# Patient Record
Sex: Female | Born: 1945 | Race: White | Hispanic: No | Smoking: Former smoker
Health system: Southern US, Community
[De-identification: ages and names within clinical notes are randomized; demographics above are authoritative.]

## PROBLEM LIST (undated history)

## (undated) DIAGNOSIS — Z87442 Personal history of urinary calculi: Secondary | ICD-10-CM

## (undated) DIAGNOSIS — M1990 Unspecified osteoarthritis, unspecified site: Secondary | ICD-10-CM

## (undated) DIAGNOSIS — R011 Cardiac murmur, unspecified: Secondary | ICD-10-CM

## (undated) DIAGNOSIS — I350 Nonrheumatic aortic (valve) stenosis: Secondary | ICD-10-CM

## (undated) DIAGNOSIS — I509 Heart failure, unspecified: Secondary | ICD-10-CM

## (undated) DIAGNOSIS — J189 Pneumonia, unspecified organism: Secondary | ICD-10-CM

## (undated) DIAGNOSIS — F329 Major depressive disorder, single episode, unspecified: Secondary | ICD-10-CM

## (undated) DIAGNOSIS — F32A Depression, unspecified: Secondary | ICD-10-CM

## (undated) DIAGNOSIS — Z950 Presence of cardiac pacemaker: Secondary | ICD-10-CM

## (undated) DIAGNOSIS — I10 Essential (primary) hypertension: Secondary | ICD-10-CM

## (undated) DIAGNOSIS — I9589 Other hypotension: Secondary | ICD-10-CM

## (undated) DIAGNOSIS — E119 Type 2 diabetes mellitus without complications: Secondary | ICD-10-CM

## (undated) DIAGNOSIS — J449 Chronic obstructive pulmonary disease, unspecified: Secondary | ICD-10-CM

## (undated) DIAGNOSIS — I639 Cerebral infarction, unspecified: Secondary | ICD-10-CM

## (undated) DIAGNOSIS — R55 Syncope and collapse: Secondary | ICD-10-CM

## (undated) DIAGNOSIS — R0601 Orthopnea: Secondary | ICD-10-CM

## (undated) DIAGNOSIS — G43909 Migraine, unspecified, not intractable, without status migrainosus: Secondary | ICD-10-CM

## (undated) DIAGNOSIS — K8020 Calculus of gallbladder without cholecystitis without obstruction: Secondary | ICD-10-CM

## (undated) DIAGNOSIS — E7800 Pure hypercholesterolemia, unspecified: Secondary | ICD-10-CM

## (undated) DIAGNOSIS — E039 Hypothyroidism, unspecified: Secondary | ICD-10-CM

## (undated) DIAGNOSIS — I5022 Chronic systolic (congestive) heart failure: Secondary | ICD-10-CM

## (undated) DIAGNOSIS — F419 Anxiety disorder, unspecified: Secondary | ICD-10-CM

## (undated) DIAGNOSIS — D649 Anemia, unspecified: Secondary | ICD-10-CM

## (undated) DIAGNOSIS — N189 Chronic kidney disease, unspecified: Secondary | ICD-10-CM

## (undated) HISTORY — PX: CARDIAC CATHETERIZATION: SHX172

## (undated) HISTORY — PX: CYST EXCISION: SHX5701

## (undated) HISTORY — DX: Calculus of gallbladder without cholecystitis without obstruction: K80.20

## (undated) HISTORY — DX: Hypothyroidism, unspecified: E03.9

## (undated) HISTORY — PX: VEIN SURGERY: SHX48

## (undated) HISTORY — PX: BREAST SURGERY: SHX581

## (undated) SURGERY — ESOPHAGOGASTRODUODENOSCOPY (EGD) WITH PROPOFOL
Anesthesia: Monitor Anesthesia Care

---

## 1969-05-14 HISTORY — PX: CHOLECYSTECTOMY OPEN: SUR202

## 1999-05-15 DIAGNOSIS — I639 Cerebral infarction, unspecified: Secondary | ICD-10-CM

## 1999-05-15 HISTORY — DX: Cerebral infarction, unspecified: I63.9

## 2007-05-15 HISTORY — PX: LAPAROSCOPIC GASTRIC SLEEVE RESECTION: SHX5895

## 2013-01-19 HISTORY — PX: INSERT / REPLACE / REMOVE PACEMAKER: SUR710

## 2013-01-26 HISTORY — PX: CARDIAC VALVE REPLACEMENT: SHX585

## 2013-02-25 ENCOUNTER — Ambulatory Visit
Admission: RE | Admit: 2013-02-25 | Discharge: 2013-02-25 | Disposition: A | Discharge status: Home / Self Care | Payer: Medicare Other | Source: Ambulatory Visit | Attending: Cardiovascular Disease | Admitting: Cardiovascular Disease

## 2013-02-25 ENCOUNTER — Encounter (HOSPITAL_COMMUNITY): Payer: Self-pay | Admitting: General Practice

## 2013-02-25 ENCOUNTER — Inpatient Hospital Stay (HOSPITAL_COMMUNITY)
Admission: AD | Admit: 2013-02-25 | Discharge: 2013-02-27 | DRG: 194 | Disposition: A | Discharge status: Home / Self Care | Payer: Medicare (Managed Care) | Source: Ambulatory Visit | Attending: Cardiovascular Disease | Admitting: Cardiovascular Disease

## 2013-02-25 ENCOUNTER — Other Ambulatory Visit: Payer: Self-pay | Admitting: Cardiovascular Disease

## 2013-02-25 DIAGNOSIS — Z87891 Personal history of nicotine dependence: Secondary | ICD-10-CM

## 2013-02-25 DIAGNOSIS — Z8673 Personal history of transient ischemic attack (TIA), and cerebral infarction without residual deficits: Secondary | ICD-10-CM

## 2013-02-25 DIAGNOSIS — E662 Morbid (severe) obesity with alveolar hypoventilation: Secondary | ICD-10-CM | POA: Diagnosis present

## 2013-02-25 DIAGNOSIS — E7800 Pure hypercholesterolemia, unspecified: Secondary | ICD-10-CM | POA: Diagnosis present

## 2013-02-25 DIAGNOSIS — F411 Generalized anxiety disorder: Secondary | ICD-10-CM | POA: Diagnosis present

## 2013-02-25 DIAGNOSIS — Z79899 Other long term (current) drug therapy: Secondary | ICD-10-CM

## 2013-02-25 DIAGNOSIS — Z954 Presence of other heart-valve replacement: Secondary | ICD-10-CM

## 2013-02-25 DIAGNOSIS — J4489 Other specified chronic obstructive pulmonary disease: Secondary | ICD-10-CM | POA: Diagnosis present

## 2013-02-25 DIAGNOSIS — F3289 Other specified depressive episodes: Secondary | ICD-10-CM | POA: Diagnosis present

## 2013-02-25 DIAGNOSIS — I712 Thoracic aortic aneurysm, without rupture, unspecified: Secondary | ICD-10-CM | POA: Diagnosis present

## 2013-02-25 DIAGNOSIS — F329 Major depressive disorder, single episode, unspecified: Secondary | ICD-10-CM | POA: Diagnosis present

## 2013-02-25 DIAGNOSIS — R0602 Shortness of breath: Secondary | ICD-10-CM

## 2013-02-25 DIAGNOSIS — J189 Pneumonia, unspecified organism: Principal | ICD-10-CM | POA: Diagnosis present

## 2013-02-25 DIAGNOSIS — J449 Chronic obstructive pulmonary disease, unspecified: Secondary | ICD-10-CM | POA: Diagnosis present

## 2013-02-25 DIAGNOSIS — J90 Pleural effusion, not elsewhere classified: Secondary | ICD-10-CM | POA: Diagnosis present

## 2013-02-25 DIAGNOSIS — Z6829 Body mass index (BMI) 29.0-29.9, adult: Secondary | ICD-10-CM

## 2013-02-25 DIAGNOSIS — Z950 Presence of cardiac pacemaker: Secondary | ICD-10-CM

## 2013-02-25 DIAGNOSIS — J9819 Other pulmonary collapse: Secondary | ICD-10-CM | POA: Diagnosis present

## 2013-02-25 DIAGNOSIS — I10 Essential (primary) hypertension: Secondary | ICD-10-CM | POA: Diagnosis present

## 2013-02-25 DIAGNOSIS — M129 Arthropathy, unspecified: Secondary | ICD-10-CM | POA: Diagnosis present

## 2013-02-25 DIAGNOSIS — D500 Iron deficiency anemia secondary to blood loss (chronic): Secondary | ICD-10-CM | POA: Diagnosis present

## 2013-02-25 DIAGNOSIS — G43909 Migraine, unspecified, not intractable, without status migrainosus: Secondary | ICD-10-CM | POA: Diagnosis present

## 2013-02-25 DIAGNOSIS — E119 Type 2 diabetes mellitus without complications: Secondary | ICD-10-CM | POA: Diagnosis present

## 2013-02-25 HISTORY — DX: Migraine, unspecified, not intractable, without status migrainosus: G43.909

## 2013-02-25 HISTORY — DX: Presence of cardiac pacemaker: Z95.0

## 2013-02-25 HISTORY — DX: Unspecified osteoarthritis, unspecified site: M19.90

## 2013-02-25 HISTORY — DX: Depression, unspecified: F32.A

## 2013-02-25 HISTORY — DX: Pure hypercholesterolemia, unspecified: E78.00

## 2013-02-25 HISTORY — DX: Cardiac murmur, unspecified: R01.1

## 2013-02-25 HISTORY — DX: Type 2 diabetes mellitus without complications: E11.9

## 2013-02-25 HISTORY — DX: Major depressive disorder, single episode, unspecified: F32.9

## 2013-02-25 HISTORY — DX: Essential (primary) hypertension: I10

## 2013-02-25 HISTORY — DX: Cerebral infarction, unspecified: I63.9

## 2013-02-25 HISTORY — DX: Syncope and collapse: R55

## 2013-02-25 HISTORY — DX: Orthopnea: R06.01

## 2013-02-25 HISTORY — DX: Anxiety disorder, unspecified: F41.9

## 2013-02-25 LAB — CBC WITH DIFFERENTIAL/PLATELET
Basophils Absolute: 0 10*3/uL (ref 0.0–0.1)
Eosinophils Absolute: 0.1 10*3/uL (ref 0.0–0.7)
Eosinophils Relative: 1 % (ref 0–5)
HCT: 34.5 % — ABNORMAL LOW (ref 36.0–46.0)
Hemoglobin: 11.2 g/dL — ABNORMAL LOW (ref 12.0–15.0)
Lymphs Abs: 1.6 10*3/uL (ref 0.7–4.0)
MCH: 29 pg (ref 26.0–34.0)
MCV: 89.4 fL (ref 78.0–100.0)
Monocytes Absolute: 0.7 10*3/uL (ref 0.1–1.0)
Monocytes Relative: 8 % (ref 3–12)
Neutro Abs: 6.3 10*3/uL (ref 1.7–7.7)
Platelets: 267 10*3/uL (ref 150–400)
RBC: 3.86 MIL/uL — ABNORMAL LOW (ref 3.87–5.11)

## 2013-02-25 LAB — COMPREHENSIVE METABOLIC PANEL
ALT: 12 U/L (ref 0–35)
AST: 15 U/L (ref 0–37)
Albumin: 3.2 g/dL — ABNORMAL LOW (ref 3.5–5.2)
CO2: 24 mEq/L (ref 19–32)
Chloride: 107 mEq/L (ref 96–112)
Creatinine, Ser: 0.72 mg/dL (ref 0.50–1.10)
GFR calc non Af Amer: 87 mL/min — ABNORMAL LOW (ref >90)
Potassium: 4.5 mEq/L (ref 3.5–5.1)
Sodium: 143 mEq/L (ref 135–145)
Total Bilirubin: 0.5 mg/dL (ref 0.3–1.2)
Total Protein: 6.8 g/dL (ref 6.0–8.3)

## 2013-02-25 LAB — GLUCOSE, CAPILLARY: Glucose-Capillary: 110 mg/dL — ABNORMAL HIGH (ref 70–99)

## 2013-02-25 MED ORDER — ZOLPIDEM TARTRATE 5 MG PO TABS: 5.0000 mg | ORAL_TABLET | Freq: Every evening | ORAL | Status: DC | PRN

## 2013-02-25 MED ORDER — ACETAMINOPHEN 650 MG RE SUPP: 650.0000 mg | Freq: Four times a day (QID) | RECTAL | Status: DC | PRN

## 2013-02-25 MED ORDER — ACETAMINOPHEN 325 MG PO TABS
650.0000 mg | ORAL_TABLET | Freq: Four times a day (QID) | ORAL | Status: DC | PRN
  Administered 2013-02-26: 650 mg via ORAL
  Filled 2013-02-25: qty 2

## 2013-02-25 MED ORDER — ONDANSETRON HCL 4 MG PO TABS: 4.0000 mg | ORAL_TABLET | Freq: Four times a day (QID) | ORAL | Status: DC | PRN

## 2013-02-25 MED ORDER — DEXTROSE 5 % IV SOLN
1.0000 g | INTRAVENOUS | Status: DC
  Administered 2013-02-25 – 2013-02-26 (×2): 1 g via INTRAVENOUS
  Filled 2013-02-25 (×3): qty 10

## 2013-02-25 MED ORDER — ALBUTEROL SULFATE (5 MG/ML) 0.5% IN NEBU
2.5000 mg | INHALATION_SOLUTION | Freq: Four times a day (QID) | RESPIRATORY_TRACT | Status: DC
  Administered 2013-02-25 – 2013-02-27 (×4): 2.5 mg via RESPIRATORY_TRACT
  Filled 2013-02-25 (×7): qty 0.5

## 2013-02-25 MED ORDER — ALUM & MAG HYDROXIDE-SIMETH 200-200-20 MG/5ML PO SUSP: 30.0000 mL | Freq: Four times a day (QID) | ORAL | Status: DC | PRN

## 2013-02-25 MED ORDER — DOCUSATE SODIUM 100 MG PO CAPS
100.0000 mg | ORAL_CAPSULE | Freq: Two times a day (BID) | ORAL | Status: DC
  Administered 2013-02-26 (×2): 100 mg via ORAL
  Filled 2013-02-25 (×5): qty 1

## 2013-02-25 MED ORDER — ASPIRIN EC 325 MG PO TBEC
325.0000 mg | DELAYED_RELEASE_TABLET | Freq: Every day | ORAL | Status: DC
  Administered 2013-02-26 – 2013-02-27 (×2): 325 mg via ORAL
  Filled 2013-02-25 (×3): qty 1

## 2013-02-25 MED ORDER — ONDANSETRON HCL 4 MG/2ML IJ SOLN: 4.0000 mg | Freq: Four times a day (QID) | INTRAMUSCULAR | Status: DC | PRN

## 2013-02-25 MED ORDER — SODIUM CHLORIDE 0.9 % IJ SOLN
3.0000 mL | Freq: Two times a day (BID) | INTRAMUSCULAR | Status: DC
  Administered 2013-02-25 – 2013-02-27 (×4): 3 mL via INTRAVENOUS

## 2013-02-25 MED ORDER — ZOLPIDEM TARTRATE 5 MG PO TABS
10.0000 mg | ORAL_TABLET | Freq: Every evening | ORAL | Status: DC | PRN
  Administered 2013-02-25 – 2013-02-26 (×2): 10 mg via ORAL
  Filled 2013-02-25 (×2): qty 2

## 2013-02-25 MED ORDER — HEPARIN SODIUM (PORCINE) 5000 UNIT/ML IJ SOLN
5000.0000 [IU] | Freq: Three times a day (TID) | INTRAMUSCULAR | Status: DC
  Administered 2013-02-25 – 2013-02-27 (×5): 5000 [IU] via SUBCUTANEOUS
  Filled 2013-02-25 (×8): qty 1

## 2013-02-25 MED ORDER — IPRATROPIUM BROMIDE 0.02 % IN SOLN
0.5000 mg | Freq: Four times a day (QID) | RESPIRATORY_TRACT | Status: DC
  Administered 2013-02-25 – 2013-02-27 (×4): 0.5 mg via RESPIRATORY_TRACT
  Filled 2013-02-25 (×7): qty 2.5

## 2013-02-25 NOTE — Progress Notes (Signed)
Co-signed for LaTisha Teasley RN/BSN for assessments, IV assessments, medication administration, I's & O's, progress notes, care plans, and vital signs. Kristle Wesch M, RN//BSN 

## 2013-02-26 ENCOUNTER — Inpatient Hospital Stay (HOSPITAL_COMMUNITY): Payer: Medicare (Managed Care)

## 2013-02-26 LAB — GLUCOSE, CAPILLARY
Glucose-Capillary: 115 mg/dL — ABNORMAL HIGH (ref 70–99)
Glucose-Capillary: 99 mg/dL (ref 70–99)
Glucose-Capillary: 120 mg/dL — ABNORMAL HIGH (ref 70–99)

## 2013-02-26 LAB — BASIC METABOLIC PANEL
BUN: 24 mg/dL — ABNORMAL HIGH (ref 6–23)
CO2: 25 mEq/L (ref 19–32)
Calcium: 8.7 mg/dL (ref 8.4–10.5)
Creatinine, Ser: 0.74 mg/dL (ref 0.50–1.10)
Glucose, Bld: 112 mg/dL — ABNORMAL HIGH (ref 70–99)

## 2013-02-26 LAB — CBC
HCT: 30.9 % — ABNORMAL LOW (ref 36.0–46.0)
Hemoglobin: 10.1 g/dL — ABNORMAL LOW (ref 12.0–15.0)
MCH: 29.5 pg (ref 26.0–34.0)
MCHC: 32.7 g/dL (ref 30.0–36.0)
MCV: 90.4 fL (ref 78.0–100.0)
Platelets: 294 10*3/uL (ref 150–400)
RBC: 3.42 MIL/uL — ABNORMAL LOW (ref 3.87–5.11)
RDW: 14.3 % (ref 11.5–15.5)

## 2013-02-26 MED ORDER — LEVOTHYROXINE SODIUM 50 MCG PO TABS
50.0000 ug | ORAL_TABLET | Freq: Every day | ORAL | Status: DC
  Administered 2013-02-26 – 2013-02-27 (×2): 50 ug via ORAL
  Filled 2013-02-26 (×4): qty 1

## 2013-02-26 MED ORDER — FUROSEMIDE 10 MG/ML IJ SOLN
40.0000 mg | Freq: Once | INTRAMUSCULAR | Status: AC
  Administered 2013-02-26: 40 mg via INTRAVENOUS
  Filled 2013-02-26: qty 4

## 2013-02-26 MED ORDER — METOPROLOL TARTRATE 12.5 MG HALF TABLET
12.5000 mg | ORAL_TABLET | Freq: Two times a day (BID) | ORAL | Status: DC
  Administered 2013-02-26 – 2013-02-27 (×3): 12.5 mg via ORAL
  Filled 2013-02-26 (×4): qty 1

## 2013-02-26 MED ORDER — LOSARTAN POTASSIUM 50 MG PO TABS
50.0000 mg | ORAL_TABLET | Freq: Every day | ORAL | Status: DC
  Administered 2013-02-26 – 2013-02-27 (×2): 50 mg via ORAL
  Filled 2013-02-26 (×2): qty 1

## 2013-02-26 MED ORDER — METFORMIN HCL 850 MG PO TABS
425.0000 mg | ORAL_TABLET | Freq: Every day | ORAL | Status: DC
  Administered 2013-02-26: 425 mg via ORAL
  Filled 2013-02-26 (×3): qty 0.5

## 2013-02-26 MED ORDER — ALBUTEROL SULFATE (5 MG/ML) 0.5% IN NEBU
2.5000 mg | INHALATION_SOLUTION | Freq: Once | RESPIRATORY_TRACT | Status: AC
  Administered 2013-02-26: 2.5 mg via RESPIRATORY_TRACT
  Filled 2013-02-26: qty 0.5

## 2013-02-26 MED ORDER — FLUOXETINE HCL 20 MG PO CAPS
20.0000 mg | ORAL_CAPSULE | Freq: Every day | ORAL | Status: DC
  Administered 2013-02-26 – 2013-02-27 (×2): 20 mg via ORAL
  Filled 2013-02-26 (×2): qty 1

## 2013-02-26 MED ORDER — IOHEXOL 350 MG/ML SOLN
80.0000 mL | Freq: Once | INTRAVENOUS | Status: AC | PRN
  Administered 2013-02-26: 80 mL via INTRAVENOUS

## 2013-02-26 MED ORDER — POTASSIUM CHLORIDE CRYS ER 20 MEQ PO TBCR
20.0000 meq | EXTENDED_RELEASE_TABLET | Freq: Every day | ORAL | Status: DC
  Administered 2013-02-26 – 2013-02-27 (×2): 20 meq via ORAL
  Filled 2013-02-26 (×3): qty 1

## 2013-02-26 MED ORDER — FUROSEMIDE 20 MG PO TABS
20.0000 mg | ORAL_TABLET | Freq: Every day | ORAL | Status: DC
  Administered 2013-02-26 – 2013-02-27 (×2): 20 mg via ORAL
  Filled 2013-02-26 (×2): qty 1

## 2013-02-26 MED ORDER — ATORVASTATIN CALCIUM 40 MG PO TABS
40.0000 mg | ORAL_TABLET | Freq: Every day | ORAL | Status: DC
  Administered 2013-02-26 – 2013-02-27 (×2): 40 mg via ORAL
  Filled 2013-02-26 (×2): qty 1

## 2013-02-26 NOTE — Progress Notes (Signed)
Pt v paced. No c/o cp nor SOB.

## 2013-02-26 NOTE — Progress Notes (Signed)
Subjective:  No new complaints. No PE on CT chest but has moderate to large left pleural effusion post surgery 1 month ago. Mild transverse and descending aortic aneurysm measuring 3.7 and 3.8 cm in diameter respectively. Did not get breathing treatments today due to tests. Afebrile  Objective:  Vital Signs in the last 24 hours: Temp:  [98.1 F (36.7 C)-98.9 F (37.2 C)] 98.9 F (37.2 C) (10/16 1438) Pulse Rate:  [76-83] 76 (10/16 1500) Cardiac Rhythm:  [-] Ventricular paced (10/16 0727) Resp:  [17-20] 18 (10/16 1438) BP: (140-171)/(47-65) 171/55 mmHg (10/16 1500) SpO2:  [94 %-98 %] 98 % (10/16 1438) Weight:  [68.4 kg (150 lb 12.7 oz)-68.6 kg (151 lb 3.8 oz)] 68.6 kg (151 lb 3.8 oz) (10/16 1610)  Physical Exam: BP Readings from Last 1 Encounters:  02/26/13 171/55     Wt Readings from Last 1 Encounters:  02/26/13 68.6 kg (151 lb 3.8 oz)    Weight change:   HEENT: [place]/AT, Eyes- PERL, EOMI, Conjunctiva-Pale pink, Sclera-Non-icteric. Wears glasses and dentures. Neck: No JVD, No bruit, Trachea midline. Lungs:  Few crackles, Bilateral. Cardiac:  Regular rhythm, normal S1 and S2, no S3.  Abdomen:  Soft, non-tender. Extremities:  No edema present. No cyanosis. No clubbing. CNS: AxOx3, Cranial nerves grossly intact, moves all 4 extremities. Right handed. Skin: Warm and dry.   Intake/Output from previous day: 10/15 0701 - 10/16 0700 In: 410 [P.O.:360; IV Piggyback:50] Out: 300 [Urine:300]    Lab Results: BMET    Component Value Date/Time   NA 142 02/26/2013 0540   K 4.6 02/26/2013 0540   CL 105 02/26/2013 0540   CO2 25 02/26/2013 0540   GLUCOSE 112* 02/26/2013 0540   BUN 24* 02/26/2013 0540   CREATININE 0.74 02/26/2013 0540   CALCIUM 8.7 02/26/2013 0540   GFRNONAA 86* 02/26/2013 0540   GFRAA >90 02/26/2013 0540   CBC    Component Value Date/Time   WBC 6.9 02/26/2013 0540   RBC 3.42* 02/26/2013 0540   HGB 10.1* 02/26/2013 0540   HCT 30.9* 02/26/2013 0540   PLT 294  02/26/2013 0540   MCV 90.4 02/26/2013 0540   MCH 29.5 02/26/2013 0540   MCHC 32.7 02/26/2013 0540   RDW 14.3 02/26/2013 0540   LYMPHSABS 1.6 02/25/2013 2005   MONOABS 0.7 02/25/2013 2005   EOSABS 0.1 02/25/2013 2005   BASOSABS 0.0 02/25/2013 2005   CARDIAC ENZYMES No results found for this basename: CKTOTAL, CKMB, CKMBINDEX, TROPONINI    Scheduled Meds: . albuterol  2.5 mg Nebulization Q6H  . albuterol  2.5 mg Nebulization Once  . aspirin EC  325 mg Oral Daily  . atorvastatin  40 mg Oral Daily  . cefTRIAXone (ROCEPHIN)  IV  1 g Intravenous Q24H  . docusate sodium  100 mg Oral BID  . FLUoxetine  20 mg Oral Daily  . furosemide  40 mg Intravenous Once  . furosemide  20 mg Oral Daily  . heparin  5,000 Units Subcutaneous Q8H  . ipratropium  0.5 mg Nebulization Q6H  . levothyroxine  50 mcg Oral QAC breakfast  . losartan  50 mg Oral Daily  . metFORMIN  425 mg Oral Q breakfast  . metoprolol tartrate  12.5 mg Oral BID  . potassium chloride SA  20 mEq Oral Daily  . sodium chloride  3 mL Intravenous Q12H   Continuous Infusions:  PRN Meds:.acetaminophen, acetaminophen, alum & mag hydroxide-simeth, ondansetron (ZOFRAN) IV, ondansetron, zolpidem  Assessment/Plan: Shortness of breath, PE ruled out Possible  pnueumonia  Bilateral pleural effusion left greater than right.  Bilateral atelectasis of lower lungs  DM, II  Hypertension  Bioprosthetic AV surgery Anemia of blood loss Dual chamber pacemaker.  IV lasix x one. Resume breathing treatments. Reevaluate Effusion in 1 month.   LOS: 1 day    Orpah Cobb  MD  02/26/2013, 4:15 PM

## 2013-02-26 NOTE — Progress Notes (Signed)
Utilization Review Completed Deola Rewis J. Saadiya Wilfong, RN, BSN, NCM [phone]  

## 2013-02-26 NOTE — H&P (Signed)
Late Entry Tonya Glass is an 67 y.o. female.   Chief Complaint: Shortness of breath. HPI: 67 years old female with AV replacement and PV repair has shortness of breath with activity x 1 week. She has 80 pack year history of smoking. Her recent chest x-ray showed left pleural effusion and left basilar atelectasis or pneumonia, right basilar atelectasis and cardiomegaly with permanent pacemaker.  Past Medical History  Diagnosis Date  . Hypertension   . High cholesterol   . Heart murmur   . Walking pneumonia ~ 2012  . Orthopnoea     "just since [facility]" (02/25/2013)  . Type II diabetes mellitus   . Headache(784.0)   . Migraines     "none in years til 01/2013" (02/25/2013)  . Stroke 2001    denies residual on 02/25/2013  . Arthritis     "fingers" (02/25/2013)  . Depression   . Anxiety   . Syncope and collapse     "prior to [facility]" (02/25/2013)  . Pacemaker       Past Surgical History  Procedure Laterality Date  . Cholecystectomy  1970's  . Insert / replace / remove pacemaker  01/19/2013  . 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 Laparoscopic gastric sleeve resection  2009    History reviewed. No pertinent family history. Social History:  reports that she has quit smoking. Her smoking use included Cigarettes. She has a 80 pack-year smoking history. She has never used smokeless tobacco. She reports that she does not drink alcohol or use illicit drugs.  Allergies: No Known Allergies  Medications Prior to Admission  Medication Sig Dispense Refill  . aspirin 325 MG tablet Take 325 mg by mouth daily.      Marland Kitchen atorvastatin (LIPITOR) 40 MG tablet Take 40 mg by mouth daily.      Marland Kitchen FLUoxetine (PROZAC) 20 MG capsule Take 20 mg by mouth daily.      . furosemide (LASIX) 20 MG tablet Take 20 mg by mouth daily.      Marland Kitchen ibuprofen (ADVIL,MOTRIN) 200 MG tablet Take 400 mg by mouth every 6 (six) hours as needed for headache.      .  levothyroxine (SYNTHROID, LEVOTHROID) 50 MCG tablet Take 50 mcg by mouth daily before breakfast.      . losartan (COZAAR) 50 MG tablet Take 50 mg by mouth daily.      . metFORMIN (GLUCOPHAGE) 850 MG tablet Take 425 mg by mouth daily with breakfast.      . metoprolol tartrate (LOPRESSOR) 25 MG tablet Take 12.5 mg by mouth 2 (two) times daily.      . potassium chloride SA (K-DUR,KLOR-CON) 20 MEQ tablet Take 20 mEq by mouth daily.      Marland Kitchen zolpidem (AMBIEN) 10 MG tablet Take 10 mg by mouth at bedtime as needed for sleep.        Results for orders placed during the hospital encounter of 02/25/13 (from the past 48 hour(s))  COMPREHENSIVE METABOLIC PANEL     Status: Abnormal   Collection Time    02/25/13  8:05 PM      Result Value Range   Sodium 143  135 - 145 mEq/L   Potassium 4.5  3.5 - 5.1 mEq/L   Chloride 107  96 - 112 mEq/L   CO2 24  19 - 32 mEq/L   Glucose, Bld 118 (*) 70 - 99 mg/dL   BUN 22  6 -  23 mg/dL   Creatinine, Ser 2.13  0.50 - 1.10 mg/dL   Calcium 9.1  8.4 - 08.6 mg/dL   Total Protein 6.8  6.0 - 8.3 g/dL   Albumin 3.2 (*) 3.5 - 5.2 g/dL   AST 15  0 - 37 U/L   ALT 12  0 - 35 U/L   Alkaline Phosphatase 132 (*) 39 - 117 U/L   Total Bilirubin 0.5  0.3 - 1.2 mg/dL   GFR calc non Af Amer 87 (*) >90 mL/min   GFR calc Af Amer >90  >90 mL/min   Comment: (NOTE)     The eGFR has been calculated using the CKD EPI equation.     This calculation has not been validated in all clinical situations.     eGFR's persistently <90 mL/min signify possible Chronic Kidney     Disease.  CBC WITH DIFFERENTIAL     Status: Abnormal   Collection Time    02/25/13  8:05 PM      Result Value Range   WBC 8.7  4.0 - 10.5 K/uL   RBC 3.86 (*) 3.87 - 5.11 MIL/uL   Hemoglobin 11.2 (*) 12.0 - 15.0 g/dL   HCT 57.8 (*) 46.9 - 62.9 %   MCV 89.4  78.0 - 100.0 fL   MCH 29.0  26.0 - 34.0 pg   MCHC 32.5  30.0 - 36.0 g/dL   RDW 52.8  41.3 - 24.4 %   Platelets 267  150 - 400 K/uL   Neutrophils Relative % 72  43  - 77 %   Neutro Abs 6.3  1.7 - 7.7 K/uL   Lymphocytes Relative 18  12 - 46 %   Lymphs Abs 1.6  0.7 - 4.0 K/uL   Monocytes Relative 8  3 - 12 %   Monocytes Absolute 0.7  0.1 - 1.0 K/uL   Eosinophils Relative 1  0 - 5 %   Eosinophils Absolute 0.1  0.0 - 0.7 K/uL   Basophils Relative 0  0 - 1 %   Basophils Absolute 0.0  0.0 - 0.1 K/uL  TSH     Status: None   Collection Time    02/25/13  8:05 PM      Result Value Range   TSH 2.620  0.350 - 4.500 uIU/mL   Comment: Performed at [facility]  GLUCOSE, CAPILLARY     Status: Abnormal   Collection Time    02/25/13  9:36 PM      Result Value Range   Glucose-Capillary 110 (*) 70 - 99 mg/dL   Comment 1 Notify RN     Comment 2 Documented in Chart    BASIC METABOLIC PANEL     Status: Abnormal   Collection Time    02/26/13  5:40 AM      Result Value Range   Sodium 142  135 - 145 mEq/L   Potassium 4.6  3.5 - 5.1 mEq/L   Chloride 105  96 - 112 mEq/L   CO2 25  19 - 32 mEq/L   Glucose, Bld 112 (*) 70 - 99 mg/dL   BUN 24 (*) 6 - 23 mg/dL   Creatinine, Ser 0.10  0.50 - 1.10 mg/dL   Calcium 8.7  8.4 - 27.2 mg/dL   GFR calc non Af Amer 86 (*) >90 mL/min   GFR calc Af Amer >90  >90 mL/min   Comment: (NOTE)     The eGFR has been calculated using the CKD EPI equation.  This calculation has not been validated in all clinical situations.     eGFR's persistently <90 mL/min signify possible Chronic Kidney     Disease.  CBC     Status: Abnormal   Collection Time    02/26/13  5:40 AM      Result Value Range   WBC 6.9  4.0 - 10.5 K/uL   RBC 3.42 (*) 3.87 - 5.11 MIL/uL   Hemoglobin 10.1 (*) 12.0 - 15.0 g/dL   HCT 16.1 (*) 09.6 - 04.5 %   MCV 90.4  78.0 - 100.0 fL   MCH 29.5  26.0 - 34.0 pg   MCHC 32.7  30.0 - 36.0 g/dL   RDW 40.9  81.1 - 91.4 %   Platelets 294  150 - 400 K/uL   Dg Chest 2 View  02/25/2013   CLINICAL DATA:  Cough, shortness of breath  EXAM: CHEST  2 VIEW  COMPARISON:  None.  FINDINGS: There is a small left pleural  effusion with left basilar atelectasis. Pneumonia at the left lung base cannot be excluded. Minimal opacity at the right lung base may represent atelectasis and tiny effusion, but followup is recommended to assess stability. Cardiomegaly is noted. A permanent pacemaker is present. The bones are osteopenic and there are diffuse degenerative changes present.  IMPRESSION: 1. Left pleural effusion and left basilar atelectasis or pneumonia. Recommend followup. 2. Right basilar atelectasis. 3. Cardiomegaly with permanent pacemaker.   Electronically Signed   By: Dwyane Dee M.D.   On: 02/25/2013 17:01    ROS Significant weight loss of 178 pounds in 5 years mostly in first 3 years. Wears reading and driving glasses, No cataract surgery, no hearing loss, +ve rhinorrhea, wears full upper and lower dentures, No cough or hemoptysis, + COPD, +  Pneumonia, + dizziness, No chest pain, No leg edema, No nausea, vomiting or diarrhea. +ve constipation.No hepatitis, No blood transfusion, +ve kidney stone, +ve stroke-2001, No seizures, No psych admission +ve joint pains.  Blood pressure 140/55, pulse 77, temperature 98.7 F (37.1 C), temperature source Oral, resp. rate 17, height 5' (1.524 m), weight 68.6 kg (151 lb 3.8 oz), SpO2 94.00%. Physical Exam: HEENT: Normocephalic, atraumatic, wears glasses and full dentures. Conj-pale pink, Sclera-white. Neck-No JVD. Supple. Lungs-Few basilar crackles. Heart-S1, and S2 normal. II/VI systolic murmur. Abdomen: Soft, non-tender. Ext:-Trace edema, + clubbing. No cyanosis. Skin:- Warm and dry.  Assessment/Plan Shortness of breath Possible pnueumonia Bilateral pleural effusion left greater than right. Bilateral atelectasis of lower lungs DM, II Hypertension Possible AV surgery  Admit, antibiotic, breathing treatments Echocardiogram. Home medications. Possible CT angiogram of lungs.  Tonya Glass S 02/26/2013, 8:49 AM

## 2013-02-26 NOTE — Progress Notes (Signed)
Co-signed for LaTisha Teasley RN/BSN for assessments, IV assessments, medication administration, I's & O's, progress notes, care plans, and vital signs. Noreta Kue M, RN//BSN 

## 2013-02-26 NOTE — Progress Notes (Signed)
*  PRELIMINARY RESULTS* Echocardiogram 2D Echocardiogram has been performed.  Tonya Glass 02/26/2013, 10:05 AM

## 2013-02-27 LAB — BASIC METABOLIC PANEL
CO2: 28 mEq/L (ref 19–32)
Calcium: 8.8 mg/dL (ref 8.4–10.5)
GFR calc Af Amer: 86 mL/min — ABNORMAL LOW (ref >90)
GFR calc non Af Amer: 75 mL/min — ABNORMAL LOW (ref >90)
Glucose, Bld: 118 mg/dL — ABNORMAL HIGH (ref 70–99)
Potassium: 3.9 mEq/L (ref 3.5–5.1)
Sodium: 144 mEq/L (ref 135–145)

## 2013-02-27 LAB — GLUCOSE, CAPILLARY: Glucose-Capillary: 121 mg/dL — ABNORMAL HIGH (ref 70–99)

## 2013-02-27 LAB — FERRITIN: Ferritin: 149 ng/mL (ref 10–291)

## 2013-02-27 LAB — IRON AND TIBC
Iron: 27 ug/dL — ABNORMAL LOW (ref 42–135)
TIBC: 240 ug/dL — ABNORMAL LOW (ref 250–470)

## 2013-02-27 MED ORDER — FUROSEMIDE 40 MG PO TABS: 40.0000 mg | ORAL_TABLET | Freq: Every day | ORAL | Status: DC

## 2013-02-27 MED ORDER — FERROUS SULFATE 325 (65 FE) MG PO TABS
325.0000 mg | ORAL_TABLET | Freq: Two times a day (BID) | ORAL | Status: DC
  Filled 2013-02-27 (×2): qty 1

## 2013-02-27 MED ORDER — FLUTICASONE PROPIONATE 50 MCG/ACT NA SUSP
1.0000 | Freq: Two times a day (BID) | NASAL | Status: DC
  Filled 2013-02-27: qty 16

## 2013-02-27 MED ORDER — AZITHROMYCIN 250 MG PO TABS: ORAL_TABLET | ORAL | Status: DC

## 2013-02-27 MED ORDER — POTASSIUM CHLORIDE CRYS ER 20 MEQ PO TBCR: 20.0000 meq | EXTENDED_RELEASE_TABLET | Freq: Two times a day (BID) | ORAL | Status: DC

## 2013-02-27 MED ORDER — ALBUTEROL SULFATE HFA 108 (90 BASE) MCG/ACT IN AERS: 2.0000 | INHALATION_SPRAY | Freq: Four times a day (QID) | RESPIRATORY_TRACT | Status: DC | PRN

## 2013-02-27 MED ORDER — ZOLPIDEM TARTRATE 5 MG PO TABS: 5.0000 mg | ORAL_TABLET | Freq: Every evening | ORAL | Status: DC | PRN

## 2013-02-27 MED ORDER — FERROUS SULFATE 325 (65 FE) MG PO TABS: 325.0000 mg | ORAL_TABLET | Freq: Two times a day (BID) | ORAL | Status: DC

## 2013-02-27 NOTE — Discharge Summary (Signed)
Physician Discharge Summary  Patient ID: Tonya Glass MRN: [id number] DOB/AGE: 15-Jul-1945 67 y.o.  Admit date: 02/25/2013 Discharge date: 02/27/2013  Admission Diagnoses: Shortness of breath, PE ruled out  Possible pnueumonia  Bilateral pleural effusion left greater than right.  Bilateral atelectasis of lower lungs  DM, II  Hypertension  Bioprosthetic AV surgery  Anemia of blood loss  Dual chamber pacemaker.  Discharge Diagnoses:  Principle Problem: * Possible pnueumonia * Bilateral pleural effusion left greater than right, post operatively.  Bilateral atelectasis of lower lungs  Chronic obstructive lung disease DM, II  Hypertension  Bioprosthetic AV surgery  Transverse and descending thoracic aortic aneurysm Anemia of blood loss and iron deficiency Dual chamber pacemaker. Anxiety  Discharged Condition: fair  Hospital Course: 67 years old female with AV replacement and ascending aorta repair had shortness of breath with activity x 1 week. She has 80 pack year history of smoking. Her recent chest x-ray showed left pleural effusion and left basilar atelectasis or pneumonia, right basilar atelectasis and cardiomegaly with permanent pacemaker. Her CT chest showed transverse and descending aortic aneurysm. She responded to antibiotic, albuterol treatments and IV lasix. She was discharged home in stable condition with follow up in 1 month. If her pleural effusion did not diecrease by next month she will be referred to pulmonary doctor. Iron supplement will be added for her iron deficiency anemia.  Consults: None  Significant Diagnostic Studies: labs: Near normal electrolytes and BUN/Cr. Mildly elevated blood sugar. Normal WBC and platelets count and low Hgb of 10-11 g/dl. Normal TSH. Low Iron level of 27 mcg/dl.  CT Angiography Chest: No pulmonary embolus detected.  Permanent pacer is in place. The right atrial lead impresses upon the anterior wall of the right atrium. The right  ventricular lead appears to extend into the anterior wall myocardium.  Cardiomegaly.  Post aortic valve replacement. Pericardial fluid. This does not appear hyperdense to suggest leak of contrast. This may represent simple postoperative resolving changes. Infection could not be excluded in proper clinical setting.  Calcified ectatic ascending thoracic aorta with maximal transverse dimension of 3.7 cm. Proximal descending thoracic aorta large aneurysm spans over 8.6 cm with maximal transverse dimension of 3.8 cm. The present exam was not tailored to evaluate the aorta our however, no obvious dissection is noted.  Moderate to large left-sided and small right-sided pleural effusion. Basilar consolidation greater on the left. This may represent atelectasis and/ or infiltrate. Recommend follow-up until clearance.   Treatments: cardiac meds: furosemide, Albuterol Tx and antibiotic  Discharge Exam: Blood pressure 132/71, pulse 88, temperature 98.4 F (36.9 C), temperature source Oral, resp. rate 18, height 5' (1.524 m), weight 67.903 kg (149 lb 11.2 oz), SpO2 97.00%. HEENT: [place]/AT, Eyes- PERL, EOMI, Conjunctiva-Pale pink, Sclera-Non-icteric. Wears glasses and dentures.  Neck: No JVD, No bruit, Trachea midline.  Lungs: Few crackles, Bilateral.  Cardiac: Regular rhythm, normal S1 and S2, no S3.  Abdomen: Soft, non-tender.  Extremities: No edema present. No cyanosis. No clubbing.  CNS: AxOx3, Cranial nerves grossly intact, moves all 4 extremities. Right handed.  Skin: Warm and dry.   Disposition: 01, Home, Self care     Medication List    STOP taking these medications       metFORMIN 850 MG tablet  Commonly known as:  GLUCOPHAGE      TAKE these medications       albuterol 108 (90 BASE) MCG/ACT inhaler  Commonly known as:  PROVENTIL HFA;VENTOLIN HFA  Inhale 2 puffs into the lungs  every 6 (six) hours as needed for wheezing.     aspirin 325 MG tablet  Take 325 mg by mouth daily.      atorvastatin 40 MG tablet  Commonly known as:  LIPITOR  Take 40 mg by mouth daily.     azithromycin 250 MG tablet  Commonly known as:  ZITHROMAX Z-PAK  Use as directed.     FLUoxetine 20 MG capsule  Commonly known as:  PROZAC  Take 20 mg by mouth daily.     furosemide 40 MG tablet  Commonly known as:  LASIX  Take 1 tablet (40 mg total) by mouth daily.     ibuprofen 200 MG tablet  Commonly known as:  ADVIL,MOTRIN  Take 400 mg by mouth every 6 (six) hours as needed for headache.     levothyroxine 50 MCG tablet  Commonly known as:  SYNTHROID, LEVOTHROID  Take 50 mcg by mouth daily before breakfast.     losartan 50 MG tablet  Commonly known as:  COZAAR  Take 50 mg by mouth daily.     metoprolol tartrate 25 MG tablet  Commonly known as:  LOPRESSOR  Take 12.5 mg by mouth 2 (two) times daily.     potassium chloride SA 20 MEQ tablet  Commonly known as:  K-DUR,KLOR-CON  Take 1 tablet (20 mEq total) by mouth 2 (two) times daily.     zolpidem 5 MG tablet  Commonly known as:  AMBIEN  Take 1 tablet (5 mg total) by mouth at bedtime as needed for sleep.           Follow-up Information   Follow up with [facility] S, MD. Schedule an appointment as soon as possible for a visit in 1 month.   Specialty:  Cardiology   Contact information:   [address] [place] [address] [phone]       Signed: Ricki Rodriguez 02/27/2013, 12:35 PM

## 2013-02-27 NOTE — Progress Notes (Signed)
The patient did not have any complaints overnight and did not have any acute changes  She states that she is feeling much better this morning.  She also states that she wants a prescription for the Ambien to take at home.

## 2013-03-04 LAB — CULTURE, BLOOD (ROUTINE X 2): Culture: NO GROWTH

## 2013-05-28 ENCOUNTER — Ambulatory Visit (INDEPENDENT_AMBULATORY_CARE_PROVIDER_SITE_OTHER): Payer: Medicare Other | Admitting: *Deleted

## 2013-05-28 ENCOUNTER — Ambulatory Visit (INDEPENDENT_AMBULATORY_CARE_PROVIDER_SITE_OTHER): Payer: Medicare Other | Admitting: Internal Medicine

## 2013-05-28 ENCOUNTER — Encounter: Payer: Self-pay | Admitting: Internal Medicine

## 2013-05-28 VITALS — BP 152/62 | HR 70 | Ht 60.0 in | Wt 142.9 lb

## 2013-05-28 DIAGNOSIS — I10 Essential (primary) hypertension: Secondary | ICD-10-CM | POA: Insufficient documentation

## 2013-05-28 DIAGNOSIS — E119 Type 2 diabetes mellitus without complications: Secondary | ICD-10-CM

## 2013-05-28 DIAGNOSIS — I5022 Chronic systolic (congestive) heart failure: Secondary | ICD-10-CM

## 2013-05-28 DIAGNOSIS — I509 Heart failure, unspecified: Secondary | ICD-10-CM | POA: Insufficient documentation

## 2013-05-28 DIAGNOSIS — Z9889 Other specified postprocedural states: Secondary | ICD-10-CM

## 2013-05-28 DIAGNOSIS — E785 Hyperlipidemia, unspecified: Secondary | ICD-10-CM

## 2013-05-28 DIAGNOSIS — I495 Sick sinus syndrome: Secondary | ICD-10-CM

## 2013-05-28 DIAGNOSIS — Z950 Presence of cardiac pacemaker: Secondary | ICD-10-CM

## 2013-05-28 DIAGNOSIS — Z953 Presence of xenogenic heart valve: Secondary | ICD-10-CM | POA: Insufficient documentation

## 2013-05-28 DIAGNOSIS — Z79899 Other long term (current) drug therapy: Secondary | ICD-10-CM

## 2013-05-28 LAB — PACEMAKER DEVICE OBSERVATION

## 2013-05-28 NOTE — Patient Instructions (Addendum)
Your physician recommends that you schedule a follow-up appointment in 3 months with Dr. Sallyanne Kuster (he manages pacemakers in our office)  Your physician wants you to follow-up in: 6 months with Dr. Debara Pickett. You will receive a reminder letter in the mail two months in advance. If you don't receive a letter, please call our office to schedule the follow-up appointment.   Please have fasting blood work at your earliest convenience. Dr. Debara Pickett wants to check your cholesterol. We will call you with the results.

## 2013-05-28 NOTE — Progress Notes (Addendum)
OFFICE NOTE  Chief Complaint:  Recent cold, no other complaints  Primary Care Physician: Thurman Coyer, MD  HPI:  Tonya Glass is a pleasant 68 year old female who recently relocated to [place]. She has been living for the past 26 years in [place], where she moved with her husband who is an Training and development officer. 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 For some reason she was being followed by a doctor in [place] who recommended referral to a cardiologist for episodes of dizziness that she was experiencing. Ultimately she saw doctors in [facility] and apparently underwent placement of a pacemaker and at some point had cardiac surgery. She initially reported that she had 2 valves replaced, however subsequently said that she was told that she had a blood vessel that was go "going to burst".  This sounds somewhat like an aneurysm and I wonder if she had a Bentall procedure.  We are still trying to obtain these records. She does have a history of gastric sleeve procedure in 2011 for which she lost weight coming from 330 pounds down to 142 pounds. She also made significant dietary changes. Postoperatively she was quite short of breath and required some recovery and therefore is in [place] living with her children.  PMHx:  Past Medical History  Diagnosis Date  . Hypertension   . High cholesterol   . Heart murmur   . Walking pneumonia ~ 2012  . Orthopnoea     "just since open heart surgery" (02/25/2013)  . Type II diabetes mellitus   . Headache(784.0)   . Migraines     "none in years til 01/2013" (02/25/2013)  . Stroke 2001    denies residual on 02/25/2013  . Arthritis     "fingers" (02/25/2013)  . Depression   . Anxiety   . Syncope and collapse     "prior to open heart surgery" (02/25/2013)  . Pacemaker     [employer]     Past Surgical History  Procedure Laterality Date  . Cholecystectomy  1971  . Insert / replace / remove pacemaker   01/19/2013    [employer] - dual chamber  . Cardiac valve replacement  01/26/2013    "cow valve put in; widened the second valve" (02/25/2013) - [place], [place]  . Laparoscopic gastric sleeve resection  2009    in [place]    FAMHx:  Family History  Problem Relation Age of Onset  . Lung cancer Mother   . Heart Problems Father     SOCHx:   reports that she quit smoking about 14 years ago. Her smoking use included Cigarettes. She has a 80 pack-year smoking history. She has never used smokeless tobacco. She reports that she does not drink alcohol or use illicit drugs.  ALLERGIES:  No Known Allergies  ROS: A comprehensive review of systems was negative except for: Respiratory: positive for dyspnea on exertion  HOME MEDS: Current Outpatient Prescriptions  Medication Sig Dispense Refill  . ALPRAZolam (XANAX) 0.25 MG tablet Take 0.25 mg by mouth 3 (three) times daily as needed.      Marland Kitchen amLODipine (NORVASC) 2.5 MG tablet Take 2.5 mg by mouth daily.      Marland Kitchen aspirin 325 MG tablet Take 325 mg by mouth daily.      Marland Kitchen atorvastatin (LIPITOR) 40 MG tablet Take 40 mg by mouth daily.      . carvedilol (COREG) 6.25 MG tablet Take 6.25 mg by mouth 2 (two) times  daily.      Marland Kitchen FLUoxetine (PROZAC) 20 MG capsule Take 20 mg by mouth daily.      . furosemide (LASIX) 40 MG tablet Take 20 mg by mouth daily.      Marland Kitchen levothyroxine (SYNTHROID, LEVOTHROID) 50 MCG tablet Take 50 mcg by mouth daily before breakfast.      . losartan (COZAAR) 50 MG tablet Take 50 mg by mouth daily.      . metFORMIN (GLUCOPHAGE) 850 MG tablet Take 425 mg by mouth daily with breakfast.      . potassium chloride SA (K-DUR,KLOR-CON) 20 MEQ tablet Take 1 tablet (20 mEq total) by mouth 2 (two) times daily.  60 tablet  3   No current facility-administered medications for this visit.    LABS/IMAGING: No results found for this or any previous visit (from the past 48 hour(s)). No results found.  VITALS: BP 152/62  Pulse 70  Ht 5'  (1.524 m)  Wt 142 lb 14.4 oz (64.819 kg)  BMI 27.91 kg/m2  EXAM: General appearance: alert and no distress Neck: no carotid bruit and no JVD Lungs: clear to auscultation bilaterally Heart: regular rate and rhythm, S1, S2 normal and sharp valve sound Abdomen: soft, non-tender; bowel sounds normal; no masses,  no organomegaly Extremities: extremities normal, atraumatic, no cyanosis or edema and venous stasis dermatitis noted Pulses: 2+ and symmetric Skin: Skin color, texture, turgor normal. No rashes or lesions or midline incision appears well-healed - pacemaker pocket noted, no evidence for hematoma or seroma Neurologic: Grossly normal Psych: Pleasant, normal mood, affect  EKG: AV paced rhythm at 70  Device Interrogation: Her [employer] dual-chamber pacemaker was interrogated today and demonstrated normal function with adequate battery life (7 years). She was ventricular paced at 96% of the time. There is a short episode of NSVT about 4 beats. There were also some atrial arrhythmias noted - most notably she appears to have had atrial flutter in October 2014.  This is paroxysmal.  ASSESSMENT: 1. 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 Paroxysmal atrial flutter 3. S/p cardiac surgery - possibly Bentall procedure (awaiting records) 4. ?CABG 5. HTN 6. Hyperlipidemia 7. DM2 8. 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   Tonya Glass appears to be doing fairly well after what sounds like extensive surgery and placement of a pacemaker at some point. It is still not clear what she had at surgery and she is not clear on that either. We do understand that her surgeon was Dr. Almond Lint at the [facility]. I'll go ahead and request records from their office regarding her most recent hospitalization there.  I would like to go ahead and check a lipid profile and CMP to evaluate her control of cholesterol and  atorvastatin. Finally, it is noted that she does have underlying atrial flutter and possibly atrial fibrillation which is paroxysmal. Her current medication list only aspirin 325 mg daily.  She should be on additional anticoagulation as her CHADS2VASC score is 4 - we will review records and try to obtain an accurate medication list from her pharmacy.  Plan to see her back in 6 months, or sooner as necessary. If she is not found to be on anticoagulation, I will start her on it.  >40 minutes spend with the patient, personally evaluating databases and previous studies, and requesting medical records.  Pixie Casino, MD, [facility] Attending Cardiologist [facility]  HILTY,Kenneth C 05/28/2013, 2:27 PM

## 2013-05-29 ENCOUNTER — Encounter: Payer: Self-pay | Admitting: Internal Medicine

## 2013-05-29 LAB — MDC_IDC_ENUM_SESS_TYPE_INCLINIC
Battery Remaining Longevity: 7
Implantable Pulse Generator Serial Number: 160873
Lead Channel Impedance Value: 816 Ohm
Lead Channel Pacing Threshold Amplitude: 0.7 V
Lead Channel Pacing Threshold Amplitude: 0.9 V
Lead Channel Sensing Intrinsic Amplitude: 1.9 mV
Lead Channel Sensing Intrinsic Amplitude: 25 mV
Lead Channel Setting Pacing Amplitude: 2.5 V
Lead Channel Setting Pacing Pulse Width: 0.4 ms
Lead Channel Setting Sensing Sensitivity: 2.5 mV
MDC IDC MSMT LEADCHNL RA IMPEDANCE VALUE: 595 Ohm
MDC IDC MSMT LEADCHNL RA PACING THRESHOLD PULSEWIDTH: 0.4 ms
MDC IDC MSMT LEADCHNL RV PACING THRESHOLD PULSEWIDTH: 0.4 ms
MDC IDC SET LEADCHNL RA PACING AMPLITUDE: 2 V
MDC IDC STAT BRADY RA PERCENT PACED: 70 %
MDC IDC STAT BRADY RV PERCENT PACED: 99 %

## 2013-05-29 NOTE — Progress Notes (Signed)
Pacemaker check in clinic. Normal device function. Thresholds, sensing, impedances consistent with previous measurements. Device programmed to maximize longevity. 12 ATR episodes (<1%)---max dur. 6 mins, Max A 248---AF/AFL + ASA alone ([place] aware). 5 high ventricular rates noted---4 from 9-15 during patient's cardiac valve replacement---the other episode came from November 2014 x 4 bts. RA output decreased from acute settings to 2.0V @ 0.25ms. Histogram distribution appropriate for patient activity level. Device programmed to optimize intrinsic conduction. Estimated longevity 7 years. Patient will follow up with [facility] in 3 months.

## 2013-08-12 ENCOUNTER — Encounter: Payer: Self-pay | Admitting: Cardiovascular Disease

## 2013-08-12 ENCOUNTER — Ambulatory Visit (INDEPENDENT_AMBULATORY_CARE_PROVIDER_SITE_OTHER): Payer: Medicare Other | Admitting: Cardiovascular Disease

## 2013-08-12 VITALS — BP 172/72 | HR 72 | Ht 60.0 in | Wt 148.8 lb

## 2013-08-12 DIAGNOSIS — I10 Essential (primary) hypertension: Secondary | ICD-10-CM

## 2013-08-12 DIAGNOSIS — Z950 Presence of cardiac pacemaker: Secondary | ICD-10-CM

## 2013-08-12 DIAGNOSIS — I495 Sick sinus syndrome: Secondary | ICD-10-CM

## 2013-08-12 LAB — MDC_IDC_ENUM_SESS_TYPE_INCLINIC
Brady Statistic RV Percent Paced: 100 %
Implantable Pulse Generator Serial Number: 160873
Lead Channel Impedance Value: 819 Ohm
Lead Channel Pacing Threshold Amplitude: 0.7 V
Lead Channel Pacing Threshold Amplitude: 1 V
Lead Channel Pacing Threshold Pulse Width: 0.4 ms
Lead Channel Pacing Threshold Pulse Width: 0.4 ms
Lead Channel Sensing Intrinsic Amplitude: 1.9 mV
Lead Channel Sensing Intrinsic Amplitude: 17.3 mV
Lead Channel Setting Pacing Pulse Width: 0.4 ms
Lead Channel Setting Sensing Sensitivity: 2.5 mV
MDC IDC MSMT LEADCHNL RA IMPEDANCE VALUE: 656 Ohm
MDC IDC SET LEADCHNL RA PACING AMPLITUDE: 2 V
MDC IDC SET LEADCHNL RV PACING AMPLITUDE: 2.5 V
MDC IDC STAT BRADY RA PERCENT PACED: 73 %

## 2013-08-12 NOTE — Progress Notes (Signed)
Patient ID: Tonya Glass, female   DOB: 05-08-1946, 68 y.o.   MRN: [id number]      Reason for office visit Establish pacemaker followup  This is the first time I have met Tonya Glass, who recently started seeing Dr. Debara Pickett for cardiology care. She underwent aortic valve surgery in September and apparently developed complete heart block and a pacemaker was implanted. She has subsequently regained AV conduction, albeit with a longer PR intervals/AV delay. She is asymptomatic today and is here for a pacemaker check. Her device is a [employer] dual chamber. She has a thoracic aortic aneurysm.  No Known Allergies  Current Outpatient Prescriptions  Medication Sig Dispense Refill  . ALPRAZolam (XANAX) 0.25 MG tablet Take 0.25 mg by mouth 3 (three) times daily as needed.      Marland Kitchen amLODipine (NORVASC) 2.5 MG tablet Take 2.5 mg by mouth daily.      Marland Kitchen aspirin 325 MG tablet Take 325 mg by mouth daily.      Marland Kitchen atorvastatin (LIPITOR) 40 MG tablet Take 40 mg by mouth daily.      . carvedilol (COREG) 6.25 MG tablet Take 3.125 mg by mouth 2 (two) times daily.       . clonazePAM (KLONOPIN) 2 MG tablet Take 2 mg by mouth at bedtime.      Marland Kitchen FLUoxetine (PROZAC) 20 MG capsule Take 20 mg by mouth daily.      . furosemide (LASIX) 40 MG tablet Take 20 mg by mouth daily.      Marland Kitchen levothyroxine (SYNTHROID, LEVOTHROID) 50 MCG tablet Take 50 mcg by mouth daily before breakfast.      . losartan (COZAAR) 50 MG tablet Take 50 mg by mouth daily.      . metFORMIN (GLUCOPHAGE) 850 MG tablet Take 425 mg by mouth daily with breakfast.      . traMADol (ULTRAM) 50 MG tablet Take 50 mg by mouth as needed.       No current facility-administered medications for this visit.    Past Medical History  Diagnosis Date  . Hypertension   . High cholesterol   . Heart murmur   . Walking pneumonia ~ 2012  . Orthopnoea     "just since open heart surgery" (02/25/2013)  . Type II diabetes mellitus   . Headache(784.0)     . Migraines     "none in years til 01/2013" (02/25/2013)  . Stroke 2001    denies residual on 02/25/2013  . Arthritis     "fingers" (02/25/2013)  . Depression   . Anxiety   . Syncope and collapse     "prior to open heart surgery" (02/25/2013)  . Pacemaker     [employer]     Past Surgical History  Procedure Laterality Date  . Cholecystectomy  1971  . Insert / replace / remove pacemaker  01/19/2013    [employer] - dual chamber  . Cardiac valve replacement  01/26/2013    "cow valve put in; widened the second valve" (02/25/2013) - [place], [place]  . Laparoscopic gastric sleeve resection  2009    in [place]    Family History  Problem Relation Age of Onset  . Lung cancer Mother   . Heart Problems Father     History   Social History  . Marital Status: Single    Spouse Name: N/A    Number of Children: 2  . Years of Education: dental sch   Occupational History  . Not on file.  Social History Main Topics  . Smoking status: Former Smoker -- 2.00 packs/day for 40 years    Types: Cigarettes    Quit date: 05/29/1999  . Smokeless tobacco: Never Used     Comment: 02/25/2013 "quit smoking in 2001"  . Alcohol Use: No  . Drug Use: No  . Sexual Activity: No   Other Topics Concern  . Not on file   Social History Narrative  . No narrative on file    Review of systems: The patient specifically denies any chest pain at rest or with exertion, dyspnea at rest or with exertion, orthopnea, paroxysmal nocturnal dyspnea, syncope, palpitations, focal neurological deficits, intermittent claudication, lower extremity edema, unexplained weight gain, cough, hemoptysis or wheezing.  The patient also denies abdominal pain, nausea, vomiting, dysphagia, diarrhea, constipation, polyuria, polydipsia, dysuria, hematuria, frequency, urgency, abnormal bleeding or bruising, fever, chills, unexpected weight changes, mood swings, change in skin or hair texture, change in voice quality,  auditory or visual problems, allergic reactions or rashes, new musculoskeletal complaints other than usual "aches and pains".  PHYSICAL EXAM BP 172/72  Pulse 72  Ht 5' (1.524 m)  Wt 67.495 kg (148 lb 12.8 oz)  BMI 29.06 kg/m2  General: Alert, oriented x3, no distress Head: no evidence of trauma, PERRL, EOMI, no exophtalmos or lid lag, no myxedema, no xanthelasma; normal ears, nose and oropharynx Neck: normal jugular venous pulsations and no hepatojugular reflux; brisk carotid pulses without delay and no carotid bruits Chest: clear to auscultation, no signs of consolidation by percussion or palpation, normal fremitus, symmetrical and full respiratory excursions;  Cardiovascular: normal position and quality of the apical impulse, regular rhythm, normal first and second heart sounds, no murmurs, rubs or gallops Abdomen: no tenderness or distention, no masses by palpation, no abnormal pulsatility or arterial bruits, normal bowel sounds, no hepatosplenomegaly Extremities: no clubbing, cyanosis or edema; 2+ radial, ulnar and brachial pulses bilaterally; 2+ right femoral, posterior tibial and dorsalis pedis pulses; 2+ left femoral, posterior tibial and dorsalis pedis pulses; no subclavian or femoral bruits Neurological: grossly nonfocal  BMET    Component Value Date/Time   NA 144 02/27/2013 0600   K 3.9 02/27/2013 0600   CL 107 02/27/2013 0600   CO2 28 02/27/2013 0600   GLUCOSE 118* 02/27/2013 0600   BUN 23 02/27/2013 0600   CREATININE 0.80 02/27/2013 0600   CALCIUM 8.8 02/27/2013 0600   GFRNONAA 75* 02/27/2013 0600   GFRAA 86* 02/27/2013 0600     ASSESSMENT AND PLAN Cardiac pacemaker in situ Both the atrial lead and ventricular lead parameters are excellent. The device was programmed with a fixed AV delay at 180 ms and she has been pacing the ventricle 100% at that setting. There is also fairly high rates of atrial pacing. During normal sinus rhythm her AV delay is relatively short and  around 240-250 ms, but during atrial pacing the AV delay is quite long at greater than 300 ms which explains the high frequency of pacing. Adaptive AV delay/AV search was turned on and hopefully we'll see a marked reduction in ventricular pacing. She is enrolled in [facility] follow-up, but we need to get her a cellular adapter    HTN (hypertension) Was quite high when she first checked in today but by the time I examined her it was down to 141/72 mm Hg. She is on a regimen of antihypertensives that appears to be a little complex with very low doses of amlodipine and carvedilol and losartan. I wonder whether this could  be simplified. The ARB is desirable since she has diabetes and the beta blocker is probably a good idea since she has a history of aortic aneurysm. On the other hand use of the beta blocker may increase the need for ventricular pacing. I did not make any changes to her medications today we'll discuss options with Dr. Debara Pickett.   No orders of the defined types were placed in this encounter.   Meds ordered this encounter  Medications  . clonazePAM (KLONOPIN) 2 MG tablet    Sig: Take 2 mg by mouth at bedtime.  . traMADol (ULTRAM) 50 MG tablet    Sig: Take 50 mg by mouth as needed.    Holli Humbles, MD, [place] [phone] office [phone] pager

## 2013-08-12 NOTE — Patient Instructions (Signed)
Remote monitoring is used to monitor your Pacemaker of ICD from home. This monitoring reduces the number of office visits required to check your device to one time per year. It allows [place] to keep an eye on the functioning of your device to ensure it is working properly. You are scheduled for a device check from home on November 13, 2013. You may send your transmission at any time that day. If you have a wireless device, the transmission will be sent automatically. After your physician reviews your transmission, you will receive a postcard with your next transmission date.  Your physician recommends that you schedule a follow-up appointment in:  [place].

## 2013-08-12 NOTE — Assessment & Plan Note (Signed)
Both the atrial lead and ventricular lead parameters are excellent. The device was programmed with a fixed AV delay at 180 ms and she has been pacing the ventricle 100% at that setting. There is also fairly high rates of atrial pacing. During normal sinus rhythm her AV delay is relatively short and around 240-250 ms, but during atrial pacing the AV delay is quite long at greater than 300 ms which explains the high frequency of pacing. Adaptive AV delay/AV search was turned on and hopefully we'll see a marked reduction in ventricular pacing. She is enrolled in [facility] follow-up, but we need to get her a cellular adapter

## 2013-08-12 NOTE — Assessment & Plan Note (Signed)
Was quite high when she first checked in today but by the time I examined her it was down to 141/72 mm Hg. She is on a regimen of antihypertensives that appears to be a little complex with very low doses of amlodipine and carvedilol and losartan. I wonder whether this could be simplified. The ARB is desirable since she has diabetes and the beta blocker is probably a good idea since she has a history of aortic aneurysm. On the other hand use of the beta blocker may increase the need for ventricular pacing. I did not make any changes to her medications today we'll discuss options with Dr. Debara Pickett.

## 2013-08-17 NOTE — Progress Notes (Signed)
Thanks .Marland Kitchen I'll review her anti-hypertensive regimen.  -[place]

## 2013-10-19 ENCOUNTER — Telehealth: Payer: Self-pay | Admitting: Cardiovascular Disease

## 2013-10-19 NOTE — Telephone Encounter (Signed)
Pt have a monitor,but she does not have a landline.She said somebody was suppose to be sending a device so she could hook ot up. That have been months and still does not have it.

## 2013-10-19 NOTE — Telephone Encounter (Signed)
Patient aware that adaptor will be sent.

## 2013-10-19 NOTE — Telephone Encounter (Signed)
Informed patient will notify [facility] ,WHO INTERROGATED HER DEVICE Defer to [place] Day

## 2013-10-21 NOTE — Telephone Encounter (Signed)
Corene Cornea w/[facility] aware.

## 2013-11-10 ENCOUNTER — Telehealth: Payer: Self-pay | Admitting: Internal Medicine

## 2013-11-10 NOTE — Telephone Encounter (Signed)
Cecilie Kicks NP spoke to Dr Thea Silversmith. Mickel Baas gave information to Dr Debara Pickett - per Mickel Baas .  Dr. Debara Pickett would like patient to have an office appointment with him.  Reason - blood pressure  issues

## 2013-11-11 ENCOUNTER — Telehealth: Payer: Self-pay | Admitting: Internal Medicine

## 2013-11-11 NOTE — Telephone Encounter (Signed)
Called and left message for patient to call and schedule appt with Dr. Debara Pickett for blood pressure issues.Tonya Glass

## 2013-11-16 ENCOUNTER — Encounter: Payer: Medicare Other | Admitting: *Deleted

## 2013-11-16 ENCOUNTER — Telehealth: Payer: Self-pay | Admitting: Cardiology

## 2013-11-16 NOTE — Telephone Encounter (Signed)
Called to confirm remote transmission appt with pt. Pt needs help hooking up monitor. I offered pt help over the phone. Pt denied help over the phone and insist that someone come out to her home to hook up monitor. She states that there is a lack of electrical space.

## 2013-11-16 NOTE — Telephone Encounter (Signed)
Instructed pt over phone how to set up and send home transmission.

## 2013-11-17 ENCOUNTER — Ambulatory Visit (INDEPENDENT_AMBULATORY_CARE_PROVIDER_SITE_OTHER): Payer: Medicare Other | Admitting: Internal Medicine

## 2013-11-17 ENCOUNTER — Encounter: Payer: Self-pay | Admitting: Internal Medicine

## 2013-11-17 ENCOUNTER — Encounter: Payer: Self-pay | Admitting: Cardiology

## 2013-11-17 VITALS — BP 179/73 | HR 70 | Ht 61.0 in | Wt 152.3 lb

## 2013-11-17 DIAGNOSIS — Z950 Presence of cardiac pacemaker: Secondary | ICD-10-CM

## 2013-11-17 DIAGNOSIS — E785 Hyperlipidemia, unspecified: Secondary | ICD-10-CM

## 2013-11-17 DIAGNOSIS — Z9889 Other specified postprocedural states: Secondary | ICD-10-CM

## 2013-11-17 DIAGNOSIS — I10 Essential (primary) hypertension: Secondary | ICD-10-CM

## 2013-11-17 DIAGNOSIS — I5022 Chronic systolic (congestive) heart failure: Secondary | ICD-10-CM

## 2013-11-17 DIAGNOSIS — E119 Type 2 diabetes mellitus without complications: Secondary | ICD-10-CM

## 2013-11-17 MED ORDER — OLMESARTAN MEDOXOMIL 20 MG PO TABS: 20.0000 mg | ORAL_TABLET | Freq: Every day | ORAL | Status: DC

## 2013-11-17 NOTE — Progress Notes (Signed)
OFFICE NOTE  Chief Complaint:  Labile hypertension  Primary Care Physician: Thurman Coyer, MD  HPI:  Tonya Glass is a pleasant 68 year old female who recently relocated to [place]. She has been living for the past 26 years in [place], where she moved with her husband who is an Training and development officer. 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 For some reason she was being followed by a doctor in [place] who recommended referral to a cardiologist for episodes of dizziness that she was experiencing. Ultimately she saw doctors in [facility] and apparently underwent placement of a pacemaker and at some point had cardiac surgery. She initially reported that she had 2 valves replaced, however subsequently said that she was told that she had a blood vessel that was go "going to burst".  This sounds somewhat like an aneurysm and I wonder if she had a Bentall procedure.  We are still trying to obtain these records. She does have a history of gastric sleeve procedure in 2011 for which she lost weight coming from 330 pounds down to 142 pounds. She also made significant dietary changes. Postoperatively she was quite short of breath and required some recovery and therefore is in [place] living with her children.  [name] returns today for followup. Since seeing her last, she has been seen by her primary care provider about 3 times for adjustment of her medications. She sees to be having problems with low blood pressure with diastolics in the 57Q and 46N. Systolic to been in the 629-528 range. He stopped to her blood pressure medications. Today in the office her blood pressure is elevated at 179/73. She is continuing to take carvedilol 6.25 mg twice daily.  PMHx:  Past Medical History  Diagnosis Date  . Hypertension   . High cholesterol   . Heart murmur   . Walking pneumonia ~ 2012  . Orthopnoea     "just since open heart surgery" (02/25/2013)  . Type II diabetes  mellitus   . Headache(784.0)   . Migraines     "none in years til 01/2013" (02/25/2013)  . Stroke 2001    denies residual on 02/25/2013  . Arthritis     "fingers" (02/25/2013)  . Depression   . Anxiety   . Syncope and collapse     "prior to open heart surgery" (02/25/2013)  . Pacemaker     [employer]     Past Surgical History  Procedure Laterality Date  . Cholecystectomy  1971  . Insert / replace / remove pacemaker  01/19/2013    [employer] - dual chamber  . Cardiac valve replacement  01/26/2013    "cow valve put in; widened the second valve" (02/25/2013) - [place], [place]  . Laparoscopic gastric sleeve resection  2009    in [place]    FAMHx:  Family History  Problem Relation Age of Onset  . Lung cancer Mother   . Heart Problems Father     SOCHx:   reports that she quit smoking about 14 years ago. Her smoking use included Cigarettes. She has a 80 pack-year smoking history. She has never used smokeless tobacco. She reports that she does not drink alcohol or use illicit drugs.  ALLERGIES:  No Known Allergies  ROS: A comprehensive review of systems was negative except for: Respiratory: positive for dyspnea on exertion  HOME MEDS: Current Outpatient Prescriptions  Medication Sig Dispense Refill  . ALPRAZolam (XANAX) 0.25 MG tablet Take 0.25 mg  by mouth 3 (three) times daily as needed.      Marland Kitchen aspirin 325 MG tablet Take 325 mg by mouth daily.      Marland Kitchen atorvastatin (LIPITOR) 40 MG tablet Take 40 mg by mouth daily.      . carvedilol (COREG) 6.25 MG tablet Take 6.25 mg by mouth 2 (two) times daily.       . clonazePAM (KLONOPIN) 2 MG tablet Take 2 mg by mouth at bedtime.      Marland Kitchen FLUoxetine (PROZAC) 20 MG capsule Take 20 mg by mouth daily.      Marland Kitchen levothyroxine (SYNTHROID, LEVOTHROID) 50 MCG tablet Take 50 mcg by mouth daily before breakfast.      . metFORMIN (GLUCOPHAGE) 850 MG tablet Take 425 mg by mouth daily with breakfast.      . traMADol (ULTRAM) 50 MG tablet Take  50 mg by mouth as needed.       No current facility-administered medications for this visit.    LABS/IMAGING: No results found for this or any previous visit (from the past 48 hour(s)). No results found.  VITALS: BP 179/73  Pulse 70  Ht 5\' 1"  (1.549 m)  Wt 152 lb 4.8 oz (69.083 kg)  BMI 28.79 kg/m2  EXAM: deferred  EKG: deferred  ASSESSMENT: 1. S/p [employer] pacemaker - complete heart block 2. ? Paroxysmal atrial flutter - brief post-op episode 3. 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 HTN - labile 5. Hyperlipidemia 6. DM2 7. 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 Labile hypertension 9. CAD - 40% mid-LAD stenosis  PLAN: 1.   [name] appears to be doing fairly well.  It turns out she apparently presented with complete heart block and underwent pacemaker placement. Subsequently she underwent cardiac catheterization which showed a 40% LAD lesion but no other significant stenoses. 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 She therefore underwent aortic valve replacement and descending aortic root replacement. She has done quite well from that. She is now struggling with some labile blood pressures. Recently she seen her primary care provider who has subsequently decreased her medication doses. Her blood pressure in the office today is elevated. I called Dr. Thea Silversmith and spoke with him to get a better sense of what is going on with her blood pressures. His feeling was that she continued to have diastolic hypotension with medications and therefore essentially stopped most of her medications. It is difficult to know what her true blood pressures are however this represented changes do to her blood pressure measurements at home. He reported she did bring  her cuff to validate with their cuff which was accurate. Ultimately it appears that she does need additional blood pressure medication. I would recommend restarting an ARB medication. Specifically Benicar 20 mg daily.  She should continue on her carvedilol. I would recommend she follow up with Cyril Mourning, our hypertension management pharmacist. She can work with her to adjust her medications. Finally, given the brief episode of atrial flutter, I do not see a clear indication for anticoagulation. We will continue to monitor her pacemaker to evaluate for further arrhythmias.  Pixie Casino, MD, [facility] Attending Cardiologist [facility]  HILTY,Kenneth C 11/17/2013, 5:10 PM

## 2013-11-17 NOTE — Patient Instructions (Signed)
Please schedule an appointment with Erasmo Downer (pharmacist) for BP check - 1-2 weeks ** please bring a list of your BP readings and your BP cuff  We will call you about information your BP and possible medications.

## 2013-12-08 ENCOUNTER — Encounter: Payer: Self-pay | Admitting: Cardiovascular Disease

## 2013-12-09 ENCOUNTER — Telehealth: Payer: Self-pay | Admitting: Pharmacist Clinician (PhC)/ Clinical Pharmacy Specialist

## 2013-12-15 ENCOUNTER — Ambulatory Visit: Payer: Medicare Other | Admitting: Pharmacist Clinician (PhC)/ Clinical Pharmacy Specialist

## 2013-12-15 NOTE — Telephone Encounter (Signed)
Closed encounter °

## 2013-12-16 ENCOUNTER — Encounter: Payer: Self-pay | Admitting: Pharmacist Clinician (PhC)/ Clinical Pharmacy Specialist

## 2013-12-16 ENCOUNTER — Ambulatory Visit (INDEPENDENT_AMBULATORY_CARE_PROVIDER_SITE_OTHER): Payer: Medicare Other | Admitting: Pharmacist Clinician (PhC)/ Clinical Pharmacy Specialist

## 2013-12-16 VITALS — BP 200/80 | HR 76 | Ht 61.0 in | Wt 153.1 lb

## 2013-12-16 DIAGNOSIS — I10 Essential (primary) hypertension: Secondary | ICD-10-CM

## 2013-12-16 NOTE — Progress Notes (Signed)
12/16/2013 Tonya Glass 12-20-1945 [id number]   HPI:  Tonya Glass is a 68 y.o. female patient of Dr. Debara Glass with a PMH below who presents today for a blood pressure check.  Her medical history is significant in that she had a gastric sleeve procedure in 2011 and lost 150 pounds.  She had been on several blood pressure medications, but they were all stopped at various times.  Most recently Dr. Debara Glass spoke with Dr. Thea Glass, her PCP, who was concerned about diastolic hypotension, and stopped all blood pressure meds, with exception of carvedilol 6.25mg  twice daily.  His office reports that her home cuff was validated in their office and found to be accurate.  When she saw Dr. Debara Glass last month her BP was 179/73.  She was started on Benicar 20mg  at that time and has had no problems with it.  She brings in home readings today that run 568-127N systolic with mostly 17G diastolic.  She reports no dizziness or lightheadedness.  She is not a smoker, having quit in 2001, drinks one glass of red wine each evening, and some caffeine during the day.  She follows a good diet and does not add salt to her foods.  She lives in a senior apartment complex and attends weekly 1 hour chair exercise classes, although doesn't do much exercise beyond that.   Her family history is significant in that her father died of a heart attack and her brother also has problems with hypertension.  Her mother had problems with obesity, as does her daughter, who recently underwent gastric bypass after failing lap band surgery several years ago.     Current Outpatient Prescriptions  Medication Sig Dispense Refill  . albuterol (PROVENTIL HFA;VENTOLIN HFA) 108 (90 BASE) MCG/ACT inhaler Inhale 2 puffs into the lungs. Use as directed -  With exercise      . ALPRAZolam (XANAX) 0.25 MG tablet Take 0.25 mg by mouth 3 (three) times daily as needed.      Marland Kitchen aspirin 325 MG tablet Take 325 mg by mouth daily.      Marland Kitchen atorvastatin (LIPITOR) 40 MG  tablet Take 40 mg by mouth daily.      . carvedilol (COREG) 6.25 MG tablet Take 6.25 mg by mouth 2 (two) times daily.       . clonazePAM (KLONOPIN) 2 MG tablet Take 2 mg by mouth at bedtime.      Marland Kitchen FLUoxetine (PROZAC) 20 MG capsule Take 20 mg by mouth daily.      Marland Kitchen levothyroxine (SYNTHROID, LEVOTHROID) 50 MCG tablet Take 50 mcg by mouth daily before breakfast.      . metFORMIN (GLUCOPHAGE) 850 MG tablet Take 425 mg by mouth daily with breakfast.      . olmesartan (BENICAR) 20 MG tablet Take 1 tablet (20 mg total) by mouth daily.  30 tablet  11  . traMADol (ULTRAM) 50 MG tablet Take 50 mg by mouth as needed.       No current facility-administered medications for this visit.    No Known Allergies  Past Medical History  Diagnosis Date  . Hypertension   . High cholesterol   . Heart murmur   . Walking pneumonia ~ 2012  . Orthopnoea     "just since open heart surgery" (02/25/2013)  . Type II diabetes mellitus   . Headache(784.0)   . Migraines     "none in years til 01/2013" (02/25/2013)  . Stroke 2001    denies residual on 02/25/2013  .  Arthritis     "fingers" (02/25/2013)  . Depression   . Anxiety   . Syncope and collapse     "prior to open heart surgery" (02/25/2013)  . Pacemaker     [employer]     Blood pressure 200/80, pulse 76, height 5\' 1"  (1.549 m), weight 153 lb 1.6 oz (69.446 kg). Standing 178/74  R arm 190/76   ASSESSMENT AND PLAN:    Tonya Glass PharmD CPP [facility]

## 2013-12-16 NOTE — Patient Instructions (Signed)
Return for a a follow up appointment in 2 months   Your blood pressure today is 200/80 by office meter, 161/77 by your meter  Check your blood pressure at home several times per week and keep record of the readings.  Take your BP meds as follows - continue with carvedilol twice daily and Benicar once daily  Bring all of your meds, your BP cuff and your record of home blood pressures to your next appointment.  Exercise as you're able, try to walk approximately 30 minutes per day.  Keep salt intake to a minimum, especially watch canned and prepared boxed foods.  Eat more fresh fruits and vegetables and fewer canned items.  Avoid eating in fast food restaurants.    HOW TO TAKE YOUR BLOOD PRESSURE:   Rest 5 minutes before taking your blood pressure.    Don't smoke or drink caffeinated beverages for at least 30 minutes before.   Take your blood pressure before (not after) you eat.   Sit comfortably with your back supported and both feet on the floor (don't cross your legs).   Elevate your arm to heart level on a table or a desk.   Use the proper sized cuff. It should fit smoothly and snugly around your bare upper arm. There should be enough room to slip a fingertip under the cuff. The bottom edge of the cuff should be 1 inch above the crease of the elbow.   Ideally, take 3 measurements at one sitting and record the average.

## 2013-12-17 NOTE — Assessment & Plan Note (Signed)
Today by office readings her BP is still elevated at 200/80.  A repeat reading after about 10 minutes brought it down to 184/76.  Her home BP cuff, but comparison read at 161/77.  She believes she has always had "white coat" hypertension.  Her home readings for the past month have been consistently 263-785 systolic and 88'F diastolic, with a few as low at 50.  I don't think her cuff is as accurate as she believes, but she states she is not in a financial position to buy a new one.  We talked at length about exercise, and increasing hers from just once weekly.  I suspect we will have to increase the Benicar, and perhaps add a second medication, but for now, she would like to increase her exercise.  I will see her again in 6-8 weeks.  I have asked that she continue with home BP checks, and to use the BP cuff at her local pharmacy whenever she is there, to see what readings she gets from another arm cuff.  I am also considering a 24 hr ambulatory cuff, to get a better picture of what her home readings truly are.

## 2014-02-24 ENCOUNTER — Encounter: Payer: Self-pay | Admitting: Cardiology

## 2014-07-27 ENCOUNTER — Encounter: Payer: Medicare Other | Admitting: Cardiovascular Disease

## 2014-07-30 ENCOUNTER — Telehealth: Payer: Self-pay | Admitting: Internal Medicine

## 2014-07-30 ENCOUNTER — Ambulatory Visit: Payer: Medicare Other | Admitting: Internal Medicine

## 2014-08-02 NOTE — Telephone Encounter (Signed)
Close encounter 

## 2014-09-06 ENCOUNTER — Encounter: Payer: Self-pay | Admitting: Internal Medicine

## 2014-09-06 ENCOUNTER — Ambulatory Visit (INDEPENDENT_AMBULATORY_CARE_PROVIDER_SITE_OTHER): Payer: Medicare Other | Admitting: Internal Medicine

## 2014-09-06 VITALS — BP 142/68 | HR 70 | Ht 61.0 in | Wt 175.9 lb

## 2014-09-06 DIAGNOSIS — Z950 Presence of cardiac pacemaker: Secondary | ICD-10-CM | POA: Diagnosis not present

## 2014-09-06 DIAGNOSIS — Z954 Presence of other heart-valve replacement: Secondary | ICD-10-CM | POA: Diagnosis not present

## 2014-09-06 DIAGNOSIS — I10 Essential (primary) hypertension: Secondary | ICD-10-CM | POA: Diagnosis not present

## 2014-09-06 DIAGNOSIS — I2583 Coronary atherosclerosis due to lipid rich plaque: Secondary | ICD-10-CM

## 2014-09-06 DIAGNOSIS — Z952 Presence of prosthetic heart valve: Secondary | ICD-10-CM

## 2014-09-06 DIAGNOSIS — Z9889 Other specified postprocedural states: Secondary | ICD-10-CM

## 2014-09-06 DIAGNOSIS — E785 Hyperlipidemia, unspecified: Secondary | ICD-10-CM | POA: Diagnosis not present

## 2014-09-06 DIAGNOSIS — I2510 Atherosclerotic heart disease of native coronary artery without angina pectoris: Secondary | ICD-10-CM | POA: Diagnosis not present

## 2014-09-06 MED ORDER — OLMESARTAN MEDOXOMIL 20 MG PO TABS: 30.0000 mg | ORAL_TABLET | Freq: Every day | ORAL | Status: DC

## 2014-09-06 NOTE — Patient Instructions (Signed)
Your physician has recommended you make the following change in your medication..  1. STOP losartan 2. INCREASE benicar to 30mg  (1.5 tablets) by mouth daily   Your physician has requested that you have an echocardiogram. Echocardiography is a painless test that uses sound waves to create images of your heart. It provides your doctor with information about the size and shape of your heart and how well your heart's chambers and valves are working. This procedure takes approximately one hour. There are no restrictions for this procedure. >> schedule this for April 2017  Your physician wants you to follow-up in: 1 year with Dr. Debara Pickett. You will receive a reminder letter in the mail two months in advance. If you don't receive a letter, please call our office to schedule the follow-up appointment.

## 2014-09-06 NOTE — Progress Notes (Signed)
OFFICE NOTE  Chief Complaint:  No complaints  Primary Care Physician: Darden Amber, PA  HPI:  Tonya Glass is a pleasant 69 year old female who recently relocated to [place]. She has been living for the past 26 years in [place], where she moved with her husband who is an Training and development officer. 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 For some reason she was being followed by a doctor in [place] who recommended referral to a cardiologist for episodes of dizziness that she was experiencing. Ultimately she saw doctors in [facility] and apparently underwent placement of a pacemaker and at some point had cardiac surgery. She initially reported that she had 2 valves replaced, however subsequently said that she was told that she had a blood vessel that was go "going to burst".  This sounds somewhat like an aneurysm and I wonder if she had a Bentall procedure.  We are still trying to obtain these records. She does have a history of gastric sleeve procedure in 2011 for which she lost weight coming from 330 pounds down to 142 pounds. She also made significant dietary changes. Postoperatively she was quite short of breath and required some recovery and therefore is in [place] living with her children.  Tonya Glass returns today for followup. Since seeing her last, she has been seen by her primary care provider about 3 times for adjustment of her medications. She sees to be having problems with low blood pressure with diastolics in the 40J and 81X. Systolic to been in the 914-782 range. He stopped to her blood pressure medications. Today in the office her blood pressure is elevated at 179/73. She is continuing to take carvedilol 6.25 mg twice daily.  I had the pleasure seeing Tonya Glass back in the office today. Overall she is doing well denies any chest pain or worsening shortness of breath. She's had no issues regarding her pacemaker which is remotely monitor. She has a  an appointment scheduled to see Dr. Loletha Grayer next month. She had repeat echocardiography 2 years ago to assess her valve which is a 23 mm MagnaEase aortic bioprosthetic. She also had concomitant aortic root replacement at the time. Blood pressure appears well controlled on current regimen.  PMHx:  Past Medical History  Diagnosis Date  . Hypertension   . High cholesterol   . Heart murmur   . Walking pneumonia ~ 2012  . Orthopnoea     "just since open heart surgery" (02/25/2013)  . Type II diabetes mellitus   . Headache(784.0)   . Migraines     "none in years til 01/2013" (02/25/2013)  . Stroke 2001    denies residual on 02/25/2013  . Arthritis     "fingers" (02/25/2013)  . Depression   . Anxiety   . Syncope and collapse     "prior to open heart surgery" (02/25/2013)  . Pacemaker     [employer]     Past Surgical History  Procedure Laterality Date  . Cholecystectomy  1971  . Insert / replace / remove pacemaker  01/19/2013    [employer] - dual chamber  . Cardiac valve replacement  01/26/2013    "cow valve put in; widened the second valve" (02/25/2013) - [place], [place]  . Laparoscopic gastric sleeve resection  2009    in [place]    FAMHx:  Family History  Problem Relation Age of Onset  . Lung cancer Mother   . Heart Problems Father   .  Hypertension Brother     SOCHx:   reports that she quit smoking about 15 years ago. Her smoking use included Cigarettes. She has a 80 pack-year smoking history. She has never used smokeless tobacco. She reports that she does not drink alcohol or use illicit drugs.  ALLERGIES:  No Known Allergies  ROS: A comprehensive review of systems was negative.  HOME MEDS: Current Outpatient Prescriptions  Medication Sig Dispense Refill  . albuterol (PROVENTIL HFA;VENTOLIN HFA) 108 (90 BASE) MCG/ACT inhaler Inhale 2 puffs into the lungs. Use as directed -  With exercise    . ALPRAZolam (XANAX) 0.25 MG tablet Take 0.25 mg by mouth 3 (three)  times daily as needed.    Marland Kitchen amLODipine (NORVASC) 5 MG tablet Take 1 tablet by mouth daily.    Marland Kitchen aspirin 325 MG tablet Take 325 mg by mouth daily.    Marland Kitchen atorvastatin (LIPITOR) 40 MG tablet Take 40 mg by mouth daily.    . Biotin 1 MG CAPS Take 1 capsule by mouth daily.    . carvedilol (COREG) 6.25 MG tablet Take 1 tablet by mouth 2 (two) times daily.    . clonazePAM (KLONOPIN) 2 MG tablet Take 2 mg by mouth at bedtime.    . cyanocobalamin 500 MCG tablet Take 500 mcg by mouth.    Marland Kitchen FLUoxetine (PROZAC) 20 MG capsule Take 20 mg by mouth daily.    Marland Kitchen levothyroxine (SYNTHROID, LEVOTHROID) 50 MCG tablet Take 50 mcg by mouth daily before breakfast.    . metFORMIN (GLUCOPHAGE) 850 MG tablet Take 425 mg by mouth daily with breakfast.    . olmesartan (BENICAR) 20 MG tablet Take 1.5 tablets (30 mg total) by mouth daily. 135 tablet 3  . traMADol (ULTRAM) 50 MG tablet Take 50 mg by mouth as needed.     No current facility-administered medications for this visit.    LABS/IMAGING: No results found for this or any previous visit (from the past 48 hour(s)). No results found.  VITALS: BP 142/68 mmHg  Pulse 70  Ht 5\' 1"  (1.549 m)  Wt 175 lb 14.4 oz (79.788 kg)  BMI 33.25 kg/m2  EXAM: General appearance: alert and no distress Neck: no carotid bruit and no JVD Lungs: clear to auscultation bilaterally Heart: regular rate and rhythm, S1, S2 normal, no murmur, click, rub or gallop Abdomen: soft, non-tender; bowel sounds normal; no masses,  no organomegaly Extremities: extremities normal, atraumatic, no cyanosis or edema Pulses: 2+ and symmetric Skin: Skin color, texture, turgor normal. No rashes or lesions Neurologic: Grossly normal Psych: Normal  EKG: Atrial paced rhythm at 70  ASSESSMENT: 1. S/p [employer] pacemaker - complete heart block 2. ? Paroxysmal atrial flutter - brief post-op episode 3. 01/26/13 - Aortic valve replacement with 23 mm Perimount Magna  Ease bovine pericardial  bioprosthesis by Edwards and ascending aortic  replacement with 28 mm Terumo interposition graft ([employer]) 4. HTN - labile 5. Hyperlipidemia 6. DM2 7. 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 Labile hypertension 9. CAD - 40% mid-LAD stenosis  PLAN: 1.   Tonya Glass is doing well from a cardiac standpoint. Her blood pressure is better controlled now. Her cholesterol is at goal. She will be due for repeat echocardiogram prior to her next office visit. She is scheduled to have her pacemaker interrogated next month. She has no symptoms of heart failure. Interestingly was noted she is both on Cozaar and Benicar today. I'm not sure how this occurred, however in trying to  clarify it does seem she takes both medications. I recommended that we discontinue the losartan and increase her Benicar to 30 mg daily. This should simplify her regimen.  Pixie Casino, MD, [facility] Attending Cardiologist [facility]  Malike Foglio C 09/06/2014, 5:54 PM

## 2014-10-21 ENCOUNTER — Telehealth: Payer: Self-pay | Admitting: *Deleted

## 2014-10-21 NOTE — Telephone Encounter (Signed)
Returned patient's call regarding her appointment.

## 2014-10-26 ENCOUNTER — Encounter: Payer: Self-pay | Admitting: Cardiovascular Disease

## 2014-10-26 ENCOUNTER — Ambulatory Visit (INDEPENDENT_AMBULATORY_CARE_PROVIDER_SITE_OTHER): Payer: Medicare Other | Admitting: Cardiovascular Disease

## 2014-10-26 VITALS — BP 118/52 | HR 80 | Ht 61.0 in | Wt 180.0 lb

## 2014-10-26 DIAGNOSIS — I2510 Atherosclerotic heart disease of native coronary artery without angina pectoris: Secondary | ICD-10-CM | POA: Diagnosis not present

## 2014-10-26 DIAGNOSIS — I495 Sick sinus syndrome: Secondary | ICD-10-CM | POA: Diagnosis not present

## 2014-10-26 LAB — CUP PACEART INCLINIC DEVICE CHECK
Battery Remaining Longevity: 7.5
Date Time Interrogation Session: 20160614040000
Lead Channel Impedance Value: 621 Ohm
Lead Channel Impedance Value: 805 Ohm
Lead Channel Pacing Threshold Amplitude: 0.6 V
Lead Channel Pacing Threshold Amplitude: 0.7 V
Lead Channel Pacing Threshold Pulse Width: 0.4 ms
Lead Channel Pacing Threshold Pulse Width: 0.4 ms
Lead Channel Sensing Intrinsic Amplitude: 25 mV
Lead Channel Setting Pacing Amplitude: 2 V
Lead Channel Setting Pacing Amplitude: 2.5 V
Lead Channel Setting Pacing Pulse Width: 0.4 ms
MDC IDC MSMT LEADCHNL RA SENSING INTR AMPL: 1.6 mV
MDC IDC SET LEADCHNL RV SENSING SENSITIVITY: 2.5 mV
MDC IDC STAT BRADY RA PERCENT PACED: 89 %
MDC IDC STAT BRADY RV PERCENT PACED: 24 %
Pulse Gen Serial Number: 160873
Zone Setting Detection Interval: 375 ms

## 2014-10-26 NOTE — Progress Notes (Signed)
Patient ID: Tonya Glass, female   DOB: [birth year], 69 y.o.   MRN: [id number]     Cardiology Office Note   Date:  10/27/2014   ID:  Tonya Glass, DOB 03-02-1946, MRN [id number]  PCP:  Darden Amber, PA  Cardiologist:   Sanda Klein, MD   Chief Complaint  Patient presents with  . Annual Exam    No complaints of chest pain, edema or dizziness.  Recently treated for URI.      History of Present Illness: Tonya Glass is a 69 y.o. female who presents for  Pacemaker follow-up. She recently saw Dr. Debara Pickett for overall cardiology care.  Most 2 years have passed since she received a prosthetic aortic valve and developed postoperative complete heart block. She has a dual-chamber Landscape [employer] pacemaker that has an estimated generator longevity of 7.5 years. She has 89% atrial pacing and 24% ventricular pacing. There has been a marked reduction in the frequency of ventricular pacing. No atrial fibrillation and no ventricular tachycardia have been recorded. She has normal lead parameters. Heart rate histograms are very blunted. Rate response feature was turned on today.    Past Medical History  Diagnosis Date  . Hypertension   . High cholesterol   . Heart murmur   . Walking pneumonia ~ 2012  . Orthopnoea     "just since open heart surgery" (02/25/2013)  . Type II diabetes mellitus   . Headache(784.0)   . Migraines     "none in years til 01/2013" (02/25/2013)  . Stroke 2001    denies residual on 02/25/2013  . Arthritis     "fingers" (02/25/2013)  . Depression   . Anxiety   . Syncope and collapse     "prior to open heart surgery" (02/25/2013)  . Pacemaker     [employer]     Past Surgical History  Procedure Laterality Date  . Cholecystectomy  1971  . Insert / replace / remove pacemaker  01/19/2013    [employer] - dual chamber  . Cardiac valve replacement  01/26/2013    "cow valve put in; widened the second valve" (02/25/2013) - [place], [place]  .  Laparoscopic gastric sleeve resection  2009    in [place]     Current Outpatient Prescriptions  Medication Sig Dispense Refill  . albuterol (PROVENTIL HFA;VENTOLIN HFA) 108 (90 BASE) MCG/ACT inhaler Inhale 2 puffs into the lungs. Use as directed -  With exercise    . ALPRAZolam (XANAX) 0.25 MG tablet Take 0.25 mg by mouth 3 (three) times daily as needed.    Marland Kitchen amLODipine (NORVASC) 5 MG tablet Take 1 tablet by mouth daily.    Marland Kitchen aspirin 325 MG tablet Take 325 mg by mouth daily.    Marland Kitchen atorvastatin (LIPITOR) 40 MG tablet Take 40 mg by mouth daily.    . Biotin 1 MG CAPS Take 1 capsule by mouth daily.    . carvedilol (COREG) 6.25 MG tablet Take 1 tablet by mouth 2 (two) times daily.    . clonazePAM (KLONOPIN) 2 MG tablet Take 2 mg by mouth at bedtime.    . cyanocobalamin 500 MCG tablet Take 500 mcg by mouth.    Marland Kitchen FLUoxetine (PROZAC) 20 MG capsule Take 20 mg by mouth daily.    Marland Kitchen levothyroxine (SYNTHROID, LEVOTHROID) 50 MCG tablet Take 50 mcg by mouth daily before breakfast.    . metFORMIN (GLUCOPHAGE) 850 MG tablet Take 425 mg by mouth daily with breakfast.    . olmesartan (BENICAR) 20  MG tablet Take 1.5 tablets (30 mg total) by mouth daily. 135 tablet 3  . traMADol (ULTRAM) 50 MG tablet Take 50 mg by mouth as needed.     No current facility-administered medications for this visit.    Allergies:   Review of patient's allergies indicates no known allergies.    Social History:  The patient  reports that she quit smoking about 15 years ago. Her smoking use included Cigarettes. She has a 80 pack-year smoking history. She has never used smokeless tobacco. She reports that she does not drink alcohol or use illicit drugs.   Family History:  The patient's family history includes Heart Problems in her father; Hypertension in her brother; Lung cancer in her mother.    ROS:  Please see the history of present illness.    Otherwise, review of systems positive for none.   All other systems are reviewed  and negative.    PHYSICAL EXAM: VS:  BP 118/52 mmHg  Pulse 80  Ht 5\' 1"  (1.549 m)  Wt 180 lb (81.647 kg)  BMI 34.03 kg/m2 , BMI Body mass index is 34.03 kg/(m^2).  General: Alert, oriented x3, no distress Head: no evidence of trauma, PERRL, EOMI, no exophtalmos or lid lag, no myxedema, no xanthelasma; normal ears, nose and oropharynx Neck: normal jugular venous pulsations and no hepatojugular reflux; brisk carotid pulses without delay and no carotid bruits Chest: clear to auscultation, no signs of consolidation by percussion or palpation, normal fremitus, symmetrical and full respiratory excursions Cardiovascular: normal position and quality of the apical impulse, regular rhythm, normal first and second heart sounds,  Grade 2/6 early peaking systolic ejection aortic focus murmur, no  diastolic murmurs, rubs or gallops Abdomen: no tenderness or distention, no masses by palpation, no abnormal pulsatility or arterial bruits, normal bowel sounds, no hepatosplenomegaly Extremities: no clubbing, cyanosis or edema; 2+ radial, ulnar and brachial pulses bilaterally; 2+ right femoral, posterior tibial and dorsalis pedis pulses; 2+ left femoral, posterior tibial and dorsalis pedis pulses; no subclavian or femoral bruits Neurological: grossly nonfocal Psych: euthymic mood, full affect   EKG:  EKG is not ordered today.   Recent Labs: No results found for requested labs within last 365 days.    Lipid Panel No results found for: CHOL, TRIG, HDL, CHOLHDL, VLDL, LDLCALC, LDLDIRECT    Wt Readings from Last 3 Encounters:  10/26/14 180 lb (81.647 kg)  09/06/14 175 lb 14.4 oz (79.788 kg)  12/16/13 153 lb 1.6 oz (69.446 kg)      Other studies Reviewed: Additional studies/ records that were reviewed today include:  Recent office visit notes from Dr. Debara Pickett  ASSESSMENT AND PLAN:   normally functioning dual-chamber permanent pacemaker. The need for ventricular pacing has drastically reduced but she  has almost 90% atrial pacing and evidence of chronotropic incompetence (not sure whether this is intrinsic or related to beta blocker therapy). The rate response feature was turned on today and may have to be further adjusted over the next 6-12 months.   Current medicines are reviewed at length with the patient today.  The patient does not have concerns regarding medicines.  The following changes have been made:  no change  Labs/ tests ordered today include:  Orders Placed This Encounter  Procedures  . Implantable device check   Patient Instructions  Remote monitoring is used to monitor your Pacemaker or ICD from home. This monitoring reduces the number of office visits required to check your device to one time per year. It  allows [place] to monitor the functioning of your device to ensure it is working properly. You are scheduled for a device check from home on January 27, 2015. You may send your transmission at any time that day. If you have a wireless device, the transmission will be sent automatically. After your physician reviews your transmission, you will receive a postcard with your next transmission date.   Dr. Sallyanne Kuster recommends that you schedule a follow-up appointment in: One year.       Mikael Spray, MD  10/27/2014 1:11 PM    Sanda Klein, MD, [facility] [phone] office [phone] pager

## 2014-10-26 NOTE — Patient Instructions (Signed)
Remote monitoring is used to monitor your Pacemaker or ICD from home. This monitoring reduces the number of office visits required to check your device to one time per year. It allows [place] to monitor the functioning of your device to ensure it is working properly. You are scheduled for a device check from home on January 27, 2015. You may send your transmission at any time that day. If you have a wireless device, the transmission will be sent automatically. After your physician reviews your transmission, you will receive a postcard with your next transmission date.   Dr. Sallyanne Kuster recommends that you schedule a follow-up appointment in: One year.

## 2014-11-18 ENCOUNTER — Encounter: Payer: Self-pay | Admitting: Cardiovascular Disease

## 2014-11-25 ENCOUNTER — Telehealth: Payer: Self-pay | Admitting: Internal Medicine

## 2014-11-25 NOTE — Telephone Encounter (Signed)
Patient notified MD does not refilled non-cardiac medications. She voiced understanding.

## 2014-11-25 NOTE — Telephone Encounter (Signed)
Patient is wanting Dr. Debara Pickett to refill her Clonazepam 2 mg---she has been unable to reach her PCP to get this taken care of and she is leaving for vacation at noon today--Call [facility] at [facility] [phone].  Please let patient know ASAP if we are able to do this.

## 2015-01-25 ENCOUNTER — Telehealth: Payer: Self-pay | Admitting: Cardiology

## 2015-01-25 ENCOUNTER — Ambulatory Visit (INDEPENDENT_AMBULATORY_CARE_PROVIDER_SITE_OTHER): Payer: Medicare Other | Admitting: *Deleted

## 2015-01-25 DIAGNOSIS — I495 Sick sinus syndrome: Secondary | ICD-10-CM | POA: Diagnosis not present

## 2015-01-25 NOTE — Telephone Encounter (Signed)
Spoke with pt and reminded pt of remote transmission that is due today. Pt verbalized understanding.   

## 2015-01-25 NOTE — Progress Notes (Signed)
Remote pacemaker transmission.   

## 2015-01-31 ENCOUNTER — Telehealth: Payer: Self-pay | Admitting: Internal Medicine

## 2015-01-31 NOTE — Telephone Encounter (Signed)
Tonya Glass is calling because the pharmacist states that she need a prior authorization for her Benicar . Please call   Thanks

## 2015-01-31 NOTE — Telephone Encounter (Signed)
Routed to [place]

## 2015-02-01 NOTE — Telephone Encounter (Signed)
Called patient  Tonya Glass she has three Benicar pills left  Has not had trouble before Benicar  Needs pre-auth  Patient said that she could go back to what he used to have her if necessary

## 2015-02-01 NOTE — Telephone Encounter (Signed)
Pt says she still have not received her Benicar,says she needs this asap.

## 2015-02-01 NOTE — Telephone Encounter (Signed)
PA for benicar submitted via [email or web address]

## 2015-02-02 LAB — CUP PACEART REMOTE DEVICE CHECK
Battery Remaining Longevity: 84 mo
Battery Remaining Percentage: 100 %
Date Time Interrogation Session: 20160913175900
Lead Channel Impedance Value: 816 Ohm
Lead Channel Sensing Intrinsic Amplitude: 1.3 mV
Lead Channel Sensing Intrinsic Amplitude: 18.2 mV
Lead Channel Setting Pacing Amplitude: 2 V
Lead Channel Setting Pacing Pulse Width: 0.4 ms
Lead Channel Setting Sensing Sensitivity: 2.5 mV
MDC IDC MSMT LEADCHNL RA IMPEDANCE VALUE: 613 Ohm
MDC IDC SET LEADCHNL RV PACING AMPLITUDE: 2.5 V
MDC IDC STAT BRADY RA PERCENT PACED: 98 %
MDC IDC STAT BRADY RV PERCENT PACED: 24 %
Pulse Gen Serial Number: 160873
Zone Setting Detection Interval: 375 ms

## 2015-02-02 NOTE — Telephone Encounter (Signed)
Informed patient that Prior Tonya Glass was submitted yesterday, once we receive authorization approval/denial will call back. Pt voiced thanks for the return call, understanding regarding paperwork status.  Routed to [place]

## 2015-02-02 NOTE — Telephone Encounter (Signed)
Pt says that her prescription for benicar needs to be sent to the [facility] on [address]. She also voiced that she ha 2 days left. Please f/u with her   Thanks

## 2015-02-04 ENCOUNTER — Telehealth: Payer: Self-pay | Admitting: Cardiovascular Disease

## 2015-02-04 NOTE — Telephone Encounter (Signed)
Remote received from this am. 800# given for troubleshooting. Patient voiced understanding.

## 2015-02-04 NOTE — Telephone Encounter (Signed)
Follow up      Please call Tonya Glass at [facility] regarding pt having trouble with her monitor

## 2015-02-04 NOTE — Telephone Encounter (Signed)
Patient called  Noticed with her device the white heart is not blinking and that there are 3 yellow lite yellow lines showing  Routed to [place]

## 2015-02-04 NOTE — Telephone Encounter (Signed)
Called patuent.  Told her I have left a message with the [facility] at [facility] to call her about questions about her device.  Where there was a flashing white heart it is not blinking since last night and the device is showing three lighted yellow bars

## 2015-02-04 NOTE — Telephone Encounter (Signed)
Tonya Glass is calling to let you know that no one has contacted her about her monitor as of yet .Marland Kitchen Please call   Thanks

## 2015-02-04 NOTE — Telephone Encounter (Signed)
Pt is having problems with her box that she transmits from.

## 2015-02-04 NOTE — Telephone Encounter (Signed)
No response from [facility] through routing.  Called device clinic

## 2015-02-15 NOTE — Telephone Encounter (Signed)
Called pharmacy staff. Patient picked up Rx for benicar for $3.60 on 9/22

## 2015-02-16 ENCOUNTER — Encounter: Payer: Self-pay | Admitting: Cardiology

## 2015-03-01 ENCOUNTER — Encounter: Payer: Self-pay | Admitting: Cardiovascular Disease

## 2015-03-24 ENCOUNTER — Telehealth: Payer: Self-pay | Admitting: Cardiovascular Disease

## 2015-03-24 NOTE — Telephone Encounter (Signed)
Pt called in stating that she received a bill from a collection agency about something she was charged for on a visit back on 05/28/2013. She said that Dr. Loletha Grayer should not have been doing anything that wasn't covered by Medicare. She called the billing number and they told her to call back to the office. Please f/u with her  Thanks

## 2015-03-25 NOTE — Telephone Encounter (Signed)
I contacted the pt and notified her of what was told to me by Baker Janus. She voiced understanding and was very grateful of the outcome.

## 2015-03-25 NOTE — Telephone Encounter (Signed)
03/25/2015 01:06 PM Phone (Outgoing) Laurelyn Sickle Physician Billing (Other) [phone]    Completed-  Called and spoke with Baker Janus about pt's claim on 1/15. Baker Janus stated that the bill the pt received was an error and will adjust the amount owed.    Above is documented what was told to patient.

## 2015-04-26 ENCOUNTER — Ambulatory Visit (INDEPENDENT_AMBULATORY_CARE_PROVIDER_SITE_OTHER): Payer: Medicare Other | Admitting: *Deleted

## 2015-04-26 DIAGNOSIS — I495 Sick sinus syndrome: Secondary | ICD-10-CM | POA: Diagnosis not present

## 2015-04-26 NOTE — Progress Notes (Signed)
Remote pacemaker transmission.   

## 2015-04-30 LAB — CUP PACEART REMOTE DEVICE CHECK
Brady Statistic RA Percent Paced: 98 %
Brady Statistic RV Percent Paced: 24 %
Lead Channel Impedance Value: 622 Ohm
Lead Channel Impedance Value: 846 Ohm
Lead Channel Pacing Threshold Pulse Width: 0.4 ms
Lead Channel Setting Pacing Amplitude: 2 V
Lead Channel Setting Pacing Amplitude: 2.5 V
Lead Channel Setting Pacing Pulse Width: 0.4 ms
MDC IDC MSMT BATTERY REMAINING LONGEVITY: 84 mo
MDC IDC MSMT BATTERY REMAINING PERCENTAGE: 100 %
MDC IDC MSMT LEADCHNL RA PACING THRESHOLD AMPLITUDE: 0.7 V
MDC IDC PG SERIAL: 160873
MDC IDC SESS DTM: 20161213094100
MDC IDC SET LEADCHNL RV SENSING SENSITIVITY: 2.5 mV

## 2015-05-04 ENCOUNTER — Encounter: Payer: Self-pay | Admitting: Cardiology

## 2015-07-26 ENCOUNTER — Ambulatory Visit (INDEPENDENT_AMBULATORY_CARE_PROVIDER_SITE_OTHER): Payer: Medicare Other | Admitting: *Deleted

## 2015-07-26 ENCOUNTER — Telehealth: Payer: Self-pay | Admitting: Cardiology

## 2015-07-26 ENCOUNTER — Encounter: Payer: Medicare Other | Admitting: *Deleted

## 2015-07-26 DIAGNOSIS — I495 Sick sinus syndrome: Secondary | ICD-10-CM | POA: Diagnosis not present

## 2015-07-26 NOTE — Telephone Encounter (Signed)
Spoke with pt and reminded pt of remote transmission that is due today. Pt verbalized understanding.   

## 2015-07-29 ENCOUNTER — Encounter: Payer: Self-pay | Admitting: Cardiology

## 2015-08-03 ENCOUNTER — Telehealth: Payer: Self-pay | Admitting: Cardiovascular Disease

## 2015-08-03 NOTE — Telephone Encounter (Signed)
Spoke w/ pt and informed her that we did not receive her remote transmission. Pt verbalized understanding and is going to call tech services for further help.

## 2015-08-03 NOTE — Telephone Encounter (Signed)
New Message   4. Are you calling to see if we received your device transmission? YES    Received a letter

## 2015-08-05 NOTE — Progress Notes (Signed)
Remote pacemaker transmission.   

## 2015-08-30 LAB — CUP PACEART REMOTE DEVICE CHECK
Battery Remaining Percentage: 100 %
Brady Statistic RA Percent Paced: 98 %
Brady Statistic RV Percent Paced: 24 %
Date Time Interrogation Session: 20170314161500
Lead Channel Impedance Value: 613 Ohm
Lead Channel Setting Pacing Amplitude: 2 V
Lead Channel Setting Pacing Amplitude: 2.5 V
MDC IDC MSMT BATTERY REMAINING LONGEVITY: 78 mo
MDC IDC MSMT LEADCHNL RV IMPEDANCE VALUE: 819 Ohm
MDC IDC PG SERIAL: 160873
MDC IDC SET LEADCHNL RV PACING PULSEWIDTH: 0.4 ms
MDC IDC SET LEADCHNL RV SENSING SENSITIVITY: 2.5 mV

## 2015-08-31 ENCOUNTER — Encounter: Payer: Self-pay | Admitting: Cardiology

## 2015-09-07 ENCOUNTER — Encounter: Payer: Self-pay | Admitting: Gastroenterology

## 2015-09-16 ENCOUNTER — Encounter: Payer: Self-pay | Admitting: Internal Medicine

## 2015-09-16 ENCOUNTER — Ambulatory Visit (INDEPENDENT_AMBULATORY_CARE_PROVIDER_SITE_OTHER): Payer: Medicare Other | Admitting: Internal Medicine

## 2015-09-16 VITALS — BP 128/54 | HR 75 | Ht 60.0 in | Wt 184.0 lb

## 2015-09-16 DIAGNOSIS — Z9889 Other specified postprocedural states: Secondary | ICD-10-CM

## 2015-09-16 DIAGNOSIS — I2510 Atherosclerotic heart disease of native coronary artery without angina pectoris: Secondary | ICD-10-CM

## 2015-09-16 DIAGNOSIS — I5022 Chronic systolic (congestive) heart failure: Secondary | ICD-10-CM | POA: Diagnosis not present

## 2015-09-16 DIAGNOSIS — I2583 Coronary atherosclerosis due to lipid rich plaque: Secondary | ICD-10-CM

## 2015-09-16 DIAGNOSIS — Z950 Presence of cardiac pacemaker: Secondary | ICD-10-CM | POA: Diagnosis not present

## 2015-09-16 DIAGNOSIS — Z954 Presence of other heart-valve replacement: Secondary | ICD-10-CM | POA: Diagnosis not present

## 2015-09-16 DIAGNOSIS — Z952 Presence of prosthetic heart valve: Secondary | ICD-10-CM

## 2015-09-16 NOTE — Patient Instructions (Signed)
Your physician has requested that you have an echocardiogram @ [address]. Echocardiography is a painless test that uses sound waves to create images of your heart. It provides your doctor with information about the size and shape of your heart and how well your heart's chambers and valves are working. This procedure takes approximately one hour. There are no restrictions for this procedure.  Your physician wants you to follow-up in: 1 year with Dr. Hilty. You will receive a reminder letter in the mail two months in advance. If you don't receive a letter, please call our office to schedule the follow-up appointment.  

## 2015-09-16 NOTE — Progress Notes (Signed)
OFFICE NOTE  Chief Complaint:  No complaints  Primary Care Physician: Darden Amber, PA  HPI:  Tonya Glass is a pleasant 70 year old female who recently relocated to [place]. She has been living for the past 26 years in [place], where she moved with her husband who is an Training and development officer. 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 For some reason she was being followed by a doctor in [place] who recommended referral to a cardiologist for episodes of dizziness that she was experiencing. Ultimately she saw doctors in [facility] and apparently underwent placement of a pacemaker and at some point had cardiac surgery. She initially reported that she had 2 valves replaced, however subsequently said that she was told that she had a blood vessel that was go "going to burst".  This sounds somewhat like an aneurysm and I wonder if she had a Bentall procedure.  We are still trying to obtain these records. She does have a history of gastric sleeve procedure in 2011 for which she lost weight coming from 330 pounds down to 142 pounds. She also made significant dietary changes. Postoperatively she was quite short of breath and required some recovery and therefore is in [place] living with her children.  Mrs. Buske returns today for followup. Since seeing her last, she has been seen by her primary care provider about 3 times for adjustment of her medications. She sees to be having problems with low blood pressure with diastolics in the 123456 and 123456. Systolic to been in the 123456 range. He stopped to her blood pressure medications. Today in the office her blood pressure is elevated at 179/73. She is continuing to take carvedilol 6.25 mg twice daily.  I had the pleasure seeing Noelie Nunnery back in the office today. Overall she is doing well denies any chest pain or worsening shortness of breath. She's had no issues regarding her pacemaker which is remotely monitor. She has a  an appointment scheduled to see Dr. Loletha Grayer next month. She had repeat echocardiography 2 years ago to assess her valve which is a 23 mm MagnaEase aortic bioprosthetic. She also had concomitant aortic root replacement at the time. Blood pressure appears well controlled on current regimen.  09/16/2015  Mrs. Barbarito returns for follow-up. She was seen last year at which time I made some adjustments on her medications. She was on both ACE and ARB and I discontinued her 76s. I increased her ARB slightly and blood pressures remain stable. Clinically she is asymptomatic, denying any chest pain or worsening shortness of breath. I recommended an echocardiogram at her last office visit but that was never performed. Recently she had lab work through her primary care provider which I was able to view on care everywhere. Cholesterol was well controlled with LDL around 70. Blood pressure is again is at goal. Her pacemaker is being followed by remote checks and she has appointment with Dr. Loletha Grayer in June for further evaluation. EKG today shows an atrial paced rhythm at 75.  PMHx:  Past Medical History  Diagnosis Date  . Hypertension   . High cholesterol   . Heart murmur   . Walking pneumonia ~ 2012  . Orthopnoea     "just since open heart surgery" (02/25/2013)  . Type II diabetes mellitus ([place])   . Headache(784.0)   . Migraines     "none in years til 01/2013" (02/25/2013)  . Stroke [facility]) 2001    denies residual on 02/25/2013  .  Arthritis     "fingers" (02/25/2013)  . Depression   . Anxiety   . Syncope and collapse     "prior to open heart surgery" (02/25/2013)  . Pacemaker     [employer]     Past Surgical History  Procedure Laterality Date  . Cholecystectomy  1971  . Insert / replace / remove pacemaker  01/19/2013    [employer] - dual chamber  . Cardiac valve replacement  01/26/2013    "cow valve put in; widened the second valve" (02/25/2013) - [place], [place]  . Laparoscopic gastric sleeve  resection  2009    in [place]    FAMHx:  Family History  Problem Relation Age of Onset  . Lung cancer Mother   . Heart Problems Father   . Hypertension Brother     SOCHx:   reports that she quit smoking about 16 years ago. Her smoking use included Cigarettes. She has a 80 pack-year smoking history. She has never used smokeless tobacco. She reports that she does not drink alcohol or use illicit drugs.  ALLERGIES:  No Known Allergies  ROS: A comprehensive review of systems was negative.  HOME MEDS: Current Outpatient Prescriptions  Medication Sig Dispense Refill  . albuterol (PROVENTIL HFA;VENTOLIN HFA) 108 (90 BASE) MCG/ACT inhaler Inhale 2 puffs into the lungs. Use as directed -  With exercise    . ALPRAZolam (XANAX) 0.25 MG tablet Take 0.25 mg by mouth daily as needed.     Marland Kitchen amLODipine (NORVASC) 5 MG tablet Take 1 tablet by mouth daily.    Marland Kitchen aspirin 325 MG tablet Take 325 mg by mouth daily.    Marland Kitchen atorvastatin (LIPITOR) 40 MG tablet Take 40 mg by mouth daily.    . Biotin 1 MG CAPS Take 1 capsule by mouth daily.    . carvedilol (COREG) 6.25 MG tablet Take 1 tablet by mouth 2 (two) times daily.    . clonazePAM (KLONOPIN) 2 MG tablet Take 2 mg by mouth at bedtime.    . cyanocobalamin 500 MCG tablet Take 500 mcg by mouth.    Marland Kitchen FLUoxetine (PROZAC) 20 MG capsule Take 20 mg by mouth daily.    Marland Kitchen levothyroxine (SYNTHROID, LEVOTHROID) 50 MCG tablet Take 50 mcg by mouth daily before breakfast.    . metFORMIN (GLUCOPHAGE) 500 MG tablet Take 2 tablets by mouth 2 (two) times daily.    . Multiple Vitamins-Minerals (CENTRUM PO) Take 1 tablet by mouth daily.    Marland Kitchen olmesartan (BENICAR) 20 MG tablet Take 1.5 tablets (30 mg total) by mouth daily. 135 tablet 3  . Probiotic Product (PROBIOTIC DAILY) CAPS Take by mouth.    . traMADol (ULTRAM) 50 MG tablet Take 50 mg by mouth as needed.     No current facility-administered medications for this visit.    LABS/IMAGING: No results found for this or  any previous visit (from the past 48 hour(s)). No results found.  VITALS: BP 128/54 mmHg  Pulse 75  Ht 5' (1.524 m)  Wt 184 lb (83.462 kg)  BMI 35.94 kg/m2  EXAM: General appearance: alert and no distress Neck: no carotid bruit and no JVD Lungs: clear to auscultation bilaterally Heart: regular rate and rhythm, S1, S2 normal, no murmur, click, rub or gallop Abdomen: soft, non-tender; bowel sounds normal; no masses,  no organomegaly Extremities: extremities normal, atraumatic, no cyanosis or edema Pulses: 2+ and symmetric Skin: Skin color, texture, turgor normal. No rashes or lesions Neurologic: Grossly normal Psych: Normal  EKG:  Atrial paced rhythm at 75  ASSESSMENT: 1. S/p [employer] pacemaker - complete heart block 2. ? Paroxysmal atrial flutter - no high rate episodes noted on her pacemaker  3. 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 HTN - labile 5. Hyperlipidemia 6. DM2 7. 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 Labile hypertension 9. CAD - 40% mid-LAD stenosis  PLAN: 1.   Mrs. Bonica is doing well from a cardiac standpoint. Her blood pressure is better controlled now. Her cholesterol is at goal. She is overdue for an echocardiogram which I ordered at her last office visit one year ago. We will go ahead and reorder that today to compare to her prior echo in 2014. Specifically we need to follow her valve gradients and ascending aortic size after repair. I'll contact her with those results otherwise no medicine changes at this time. She should follow-up with Dr. Loletha Grayer in June for a pacemaker check.  Pixie Casino, MD, [facility] Attending Cardiologist [place] [facility] Menna Abeln 09/16/2015, 1:03 PM

## 2015-09-26 ENCOUNTER — Other Ambulatory Visit: Payer: Self-pay | Admitting: Internal Medicine

## 2015-09-26 DIAGNOSIS — I10 Essential (primary) hypertension: Secondary | ICD-10-CM

## 2015-09-26 MED ORDER — OLMESARTAN MEDOXOMIL 20 MG PO TABS: 30.0000 mg | ORAL_TABLET | Freq: Every day | ORAL | Status: DC

## 2015-09-26 NOTE — Telephone Encounter (Signed)
Rx request sent to pharmacy.  

## 2015-10-04 ENCOUNTER — Other Ambulatory Visit: Payer: Self-pay

## 2015-10-04 ENCOUNTER — Ambulatory Visit (HOSPITAL_COMMUNITY): Payer: Medicare Other | Attending: Cardiology

## 2015-10-04 DIAGNOSIS — Z952 Presence of prosthetic heart valve: Secondary | ICD-10-CM

## 2015-10-04 DIAGNOSIS — Z954 Presence of other heart-valve replacement: Secondary | ICD-10-CM | POA: Diagnosis not present

## 2015-10-04 DIAGNOSIS — I359 Nonrheumatic aortic valve disorder, unspecified: Secondary | ICD-10-CM | POA: Diagnosis present

## 2015-10-04 DIAGNOSIS — Z9889 Other specified postprocedural states: Secondary | ICD-10-CM | POA: Diagnosis not present

## 2015-10-04 DIAGNOSIS — I272 Other secondary pulmonary hypertension: Secondary | ICD-10-CM | POA: Diagnosis not present

## 2015-10-18 ENCOUNTER — Ambulatory Visit: Payer: Medicare Other | Admitting: Gastroenterology

## 2015-10-25 ENCOUNTER — Ambulatory Visit (INDEPENDENT_AMBULATORY_CARE_PROVIDER_SITE_OTHER): Payer: Medicare Other | Admitting: Cardiovascular Disease

## 2015-10-25 ENCOUNTER — Encounter: Payer: Self-pay | Admitting: Cardiovascular Disease

## 2015-10-25 VITALS — BP 144/62 | HR 70 | Ht 61.0 in | Wt 182.0 lb

## 2015-10-25 DIAGNOSIS — Z950 Presence of cardiac pacemaker: Secondary | ICD-10-CM

## 2015-10-25 DIAGNOSIS — I495 Sick sinus syndrome: Secondary | ICD-10-CM | POA: Diagnosis not present

## 2015-10-25 LAB — CUP PACEART INCLINIC DEVICE CHECK
Lead Channel Impedance Value: 817 Ohm
Lead Channel Pacing Threshold Amplitude: 0.6 V
Lead Channel Pacing Threshold Amplitude: 0.7 V
Lead Channel Pacing Threshold Pulse Width: 0.4 ms
Lead Channel Sensing Intrinsic Amplitude: 18.5 mV
Lead Channel Sensing Intrinsic Amplitude: 2 mV
Lead Channel Setting Pacing Amplitude: 2 V
Lead Channel Setting Sensing Sensitivity: 2.5 mV
MDC IDC MSMT LEADCHNL RA IMPEDANCE VALUE: 612 Ohm
MDC IDC MSMT LEADCHNL RV PACING THRESHOLD PULSEWIDTH: 0.4 ms
MDC IDC SESS DTM: 20170613040000
MDC IDC SET LEADCHNL RV PACING AMPLITUDE: 2.5 V
MDC IDC SET LEADCHNL RV PACING PULSEWIDTH: 0.4 ms
Pulse Gen Serial Number: 160873

## 2015-10-25 NOTE — Progress Notes (Signed)
Cardiology Office Note    Date:  10/25/2015   ID:  [name], DOB Jul 15, 1945, MRN [id number]  PCP:  Darden Amber, PA  Cardiologist:  Kathy Breach, M.D.; Sanda Klein, MD   Chief Complaint  Patient presents with  . Follow-up    PACER CHECK  . Shortness of Breath    HAS COLD     History of Present Illness:  Tenny Rauber is a 70 y.o. 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 She saw Dr. Debara Pickett about a month ago and is doing well. Has not had any cardiovascular complaints and specifically denies exertional angina, syncope or palpitations. She is relatively inactive due to a "bad back". She does complain of some shortness of breath if she tries to walk longer distances.  Interrogation of her pacemaker shows normal device function. Estimated generator longevity is 6.5 years. There is 97% atrial pacing and a very blunted atrial histogram. There is 24% ventricular pacing: She does not have permanent complete heart block. No atrial tachycardia or atrial fibrillation has been detected. There have been no high ventricular rate episodes. Her minute ventilation rate response sensor was turned on at her last appointment, but the accelerometer is set to "passive".  Past Medical History  Diagnosis Date  . Hypertension   . High cholesterol   . Heart murmur   . Walking pneumonia ~ 2012  . Orthopnoea     "just since open heart surgery" (02/25/2013)  . Type II diabetes mellitus ([place])   . Headache(784.0)   . Migraines     "none in years til 01/2013" (02/25/2013)  . Stroke [facility]) 2001    denies residual on 02/25/2013  . Arthritis     "fingers" (02/25/2013)  . Depression   . Anxiety   . Syncope and collapse     "prior to open heart surgery" (02/25/2013)  . Pacemaker     [employer]     Past Surgical History  Procedure  Laterality Date  . Cholecystectomy  1971  . Insert / replace / remove pacemaker  01/19/2013    [employer] - dual chamber  . Cardiac valve replacement  01/26/2013    "cow valve put in; widened the second valve" (02/25/2013) - [place], [place]  . Laparoscopic gastric sleeve resection  2009    in [place]    Current Medications: Outpatient Prescriptions Prior to Visit  Medication Sig Dispense Refill  . albuterol (PROVENTIL HFA;VENTOLIN HFA) 108 (90 BASE) MCG/ACT inhaler Inhale 2 puffs into the lungs. Use as directed -  With exercise    . ALPRAZolam (XANAX) 0.25 MG tablet Take 0.25 mg by mouth daily as needed.     Marland Kitchen amLODipine (NORVASC) 5 MG tablet Take 1 tablet by mouth daily.    Marland Kitchen aspirin 325 MG tablet Take 325 mg by mouth daily.    Marland Kitchen atorvastatin (LIPITOR) 40 MG tablet Take 40 mg by mouth daily.    . Biotin 1 MG CAPS Take 1 capsule by mouth daily.    . carvedilol (COREG) 6.25 MG tablet Take 1 tablet by mouth 2 (two) times daily.    . clonazePAM (KLONOPIN) 2 MG tablet Take 2 mg by mouth at bedtime.    . cyanocobalamin 500 MCG tablet Take 500 mcg by mouth.    Marland Kitchen FLUoxetine (PROZAC) 20 MG capsule Take 20 mg by mouth daily.    Marland Kitchen levothyroxine (  SYNTHROID, LEVOTHROID) 50 MCG tablet Take 50 mcg by mouth daily before breakfast.    . metFORMIN (GLUCOPHAGE) 500 MG tablet Take 2 tablets by mouth 2 (two) times daily.    . Multiple Vitamins-Minerals (CENTRUM PO) Take 1 tablet by mouth daily.    Marland Kitchen olmesartan (BENICAR) 20 MG tablet Take 1.5 tablets (30 mg total) by mouth daily. 135 tablet 3  . Probiotic Product (PROBIOTIC DAILY) CAPS Take by mouth.    . traMADol (ULTRAM) 50 MG tablet Take 50 mg by mouth as needed.     No facility-administered medications prior to visit.     Allergies:   Review of patient's allergies indicates no known allergies.   Social History   Social History  . Marital Status: Single    Spouse Name: N/A  . Number of Children: 2  . Years of Education: dental sch   Social  History Main Topics  . Smoking status: Former Smoker -- 2.00 packs/day for 40 years    Types: Cigarettes    Quit date: 05/29/1999  . Smokeless tobacco: Never Used     Comment: 02/25/2013 "quit smoking in 2001"  . Alcohol Use: No  . Drug Use: No  . Sexual Activity: No   Other Topics Concern  . None   Social History Narrative     Family History:  The patient's family history includes Heart Problems in her father; Hypertension in her brother; Lung cancer in her mother.   ROS:   Please see the history of present illness.    ROS All other systems reviewed and are negative.   PHYSICAL EXAM:   VS:  BP 144/62 mmHg  Pulse 70  Ht 5\' 1"  (1.549 m)  Wt 82.555 kg (182 lb)  BMI 34.41 kg/m2   GEN: Well nourished, well developed, in no acute distress HEENT: normal Neck: no JVD, carotid bruits, or masses Cardiac: RRR; no murmurs, rubs, or gallops,no edema , healthy sternotomy and pacemaker scars Respiratory:  clear to auscultation bilaterally, normal work of breathing GI: soft, nontender, nondistended, + BS MS: no deformity or atrophy Skin: warm and dry, no rash Neuro:  Alert and Oriented x 3, Strength and sensation are intact Psych: euthymic mood, full affect  Wt Readings from Last 3 Encounters:  10/25/15 82.555 kg (182 lb)  09/16/15 83.462 kg (184 lb)  10/26/14 81.647 kg (180 lb)      Studies/Labs Reviewed:   EKG:  EKG is not ordered today.   Recent Labs: No results found for requested labs within last 365 days.   Lipid Panel No results found for: CHOL, TRIG, HDL, CHOLHDL, VLDL, LDLCALC, LDLDIRECT  ASSESSMENT:    1. Sick sinus syndrome ([place])   2. Pacemaker      PLAN:  In order of problems listed above:  1. SSS: Mrs. Mcnamar does not have evidence of complete heart block but does have marked sinus node dysfunction with a heart rate that is "stuck" at the lower rate limit. She may have intrinsic chronotropic incompetence or this may be due to treatment with beta  blockers (albeit a low dose of carvedilol). 2. PPM: Minute ventilation sensor is already turned on and today we also activated her accelerometer  to see if this gives her extra exercise tolerance. Continue her modalities every 3 months. We'll see her back in the office in 6 months to see if additional adjustments are necessary to her rate response feature.    Medication Adjustments/Labs and Tests Ordered: Current medicines are reviewed at length  with the patient today.  Concerns regarding medicines are outlined above.  Medication changes, Labs and Tests ordered today are listed in the Patient Instructions below. Patient Instructions     Signed, Sanda Klein, MD  10/25/2015 9:56 AM    [facility] [place], [place],   [address] Phone: [phone]; Fax: [phone]

## 2015-10-25 NOTE — Patient Instructions (Addendum)
Dr Sallyanne Kuster recommends that you continue on your current medications as directed. Please refer to the Current Medication list given to you today.  Remote monitoring is used to monitor your Pacemaker of ICD from home. This monitoring reduces the number of office visits required to check your device to one time per year. It allows [place] to keep an eye on the functioning of your device to ensure it is working properly. You are scheduled for a device check from home on Tuesday, September 12th, 2017. You may send your transmission at any time that day. If you have a wireless device, the transmission will be sent automatically. After your physician reviews your transmission, you will receive a postcard with your next transmission date.  Dr Sallyanne Kuster recommends that you schedule a follow-up appointment in 6 months with a pacemaker check. You will receive a reminder letter in the mail two months in advance. If you don't receive a letter, please call our office to schedule the follow-up appointment.  If you need a refill on your cardiac medications before your next appointment, please call your pharmacy.

## 2015-10-27 ENCOUNTER — Encounter: Payer: Self-pay | Admitting: Cardiovascular Disease

## 2016-01-24 ENCOUNTER — Ambulatory Visit (INDEPENDENT_AMBULATORY_CARE_PROVIDER_SITE_OTHER): Payer: Medicare Other | Admitting: *Deleted

## 2016-01-24 DIAGNOSIS — I495 Sick sinus syndrome: Secondary | ICD-10-CM

## 2016-01-24 NOTE — Progress Notes (Signed)
Remote pacemaker transmission.   

## 2016-01-26 ENCOUNTER — Encounter: Payer: Self-pay | Admitting: Cardiology

## 2016-01-31 ENCOUNTER — Encounter: Payer: Self-pay | Admitting: Gastroenterology

## 2016-01-31 ENCOUNTER — Encounter (INDEPENDENT_AMBULATORY_CARE_PROVIDER_SITE_OTHER): Payer: Self-pay

## 2016-01-31 ENCOUNTER — Ambulatory Visit (INDEPENDENT_AMBULATORY_CARE_PROVIDER_SITE_OTHER): Payer: Medicare Other | Admitting: Gastroenterology

## 2016-01-31 VITALS — BP 160/60 | HR 72 | Ht 60.75 in | Wt 181.5 lb

## 2016-01-31 DIAGNOSIS — R195 Other fecal abnormalities: Secondary | ICD-10-CM

## 2016-01-31 DIAGNOSIS — Z1211 Encounter for screening for malignant neoplasm of colon: Secondary | ICD-10-CM

## 2016-01-31 MED ORDER — NA SULFATE-K SULFATE-MG SULF 17.5-3.13-1.6 GM/177ML PO SOLN: 1.0000 | Freq: Once | ORAL | 0 refills | Status: AC

## 2016-01-31 NOTE — Progress Notes (Signed)
HPI :  70 y/o female with history of CVA in 2001, pacemaker placement, HTN, here for a new patient evaluation for heme positive stool.   She reports positive FIT testing in June. She denies seeing any blood in the stools. She denies any bowel habit problems. No chronic abdominal pains. NO unexpected weight loss. She has a history of gastric sleeve around 2009. No FH of colon cancer.   She has never had a prior colonoscopy. She reports a history of CVA in 2001. She takes a regular aspiirn daily. She denies any history of NSAIDs.She has been trying to avoid colonoscopy if possible.     Past Medical History:  Diagnosis Date  . Anxiety   . Arthritis    "fingers" (02/25/2013)  . Depression   . Gallstones   . Headache(784.0)   . Heart murmur   . High cholesterol   . Hypertension   . Hypothyroidism   . Migraines    "none in years til 01/2013" (02/25/2013)  . Orthopnoea    "just since open heart surgery" (02/25/2013)  . Pacemaker    [employer]   . Stroke [facility]) 2001   denies residual on 02/25/2013  . Syncope and collapse    "prior to open heart surgery" (02/25/2013)  . Type II diabetes mellitus ([place])   . Walking pneumonia ~ 2012     Past Surgical History:  Procedure Laterality Date  . CARDIAC VALVE REPLACEMENT  01/26/2013   "cow valve put in; widened the second valve" (02/25/2013) - [place], [place]  . INSERT / REPLACE / REMOVE PACEMAKER  01/19/2013   [employer] - dual chamber  . LAPAROSCOPIC GASTRIC SLEEVE RESECTION  2009   in [place]   Family History  Problem Relation Age of Onset  . Lung cancer Mother   . Heart Problems Father   . Hypertension Brother   . Colon polyps Brother   . Skin cancer Brother   . Colon polyps Daughter   . Diabetes Maternal Aunt   . Breast cancer Maternal Aunt     mets to liver  . Diabetes Paternal Aunt    Social History  Substance Use Topics  . Smoking status: Former Smoker    Packs/day: 2.00    Years:  40.00    Types: Cigarettes    Quit date: 05/29/1999  . Smokeless tobacco: Never Used     Comment: 02/25/2013 "quit smoking in 2001"  . Alcohol use No   Current Outpatient Prescriptions  Medication Sig Dispense Refill  . albuterol (PROVENTIL HFA;VENTOLIN HFA) 108 (90 BASE) MCG/ACT inhaler Inhale 2 puffs into the lungs. Use as directed -  With exercise    . ALPRAZolam (XANAX) 0.25 MG tablet Take 0.25 mg by mouth daily as needed.     Marland Kitchen amLODipine (NORVASC) 5 MG tablet Take 1 tablet by mouth daily.    Marland Kitchen aspirin 325 MG tablet Take 325 mg by mouth daily.    Marland Kitchen atorvastatin (LIPITOR) 40 MG tablet Take 40 mg by mouth daily.    . Biotin 1 MG CAPS Take 1 capsule by mouth daily.    . carvedilol (COREG) 6.25 MG tablet Take 1 tablet by mouth 2 (two) times daily.    . clonazePAM (KLONOPIN) 2 MG tablet Take 2 mg by mouth at bedtime.    . cyanocobalamin 500 MCG tablet Take 500 mcg by mouth.    Marland Kitchen FLUoxetine (PROZAC) 20 MG capsule Take 20 mg by mouth daily.    Marland Kitchen  levothyroxine (SYNTHROID, LEVOTHROID) 50 MCG tablet Take 50 mcg by mouth daily before breakfast.    . metFORMIN (GLUCOPHAGE) 500 MG tablet Take 2 tablets by mouth 2 (two) times daily.    . Multiple Vitamins-Minerals (CENTRUM PO) Take 1 tablet by mouth daily.    Marland Kitchen olmesartan (BENICAR) 20 MG tablet Take 1.5 tablets (30 mg total) by mouth daily. 135 tablet 3  . Probiotic Product (PROBIOTIC DAILY) CAPS Take by mouth.    . traMADol (ULTRAM) 50 MG tablet Take 50 mg by mouth as needed.     No current facility-administered medications for this visit.    No Known Allergies   Review of Systems: All systems reviewed and negative except where noted in HPI.    Lab Results  Component Value Date   WBC 6.9 02/26/2013   HGB 10.1 (L) 02/26/2013   HCT 30.9 (L) 02/26/2013   MCV 90.4 02/26/2013   PLT 294 02/26/2013    Last Hgb on 06/24/13 - ws normal, in 12s    Physical Exam: Ht 5' 0.75" (1.543 m) Comment: height measured without shoes  Wt 181 lb 8  oz (82.3 kg)   BMI 34.58 kg/m  Constitutional: Pleasant,well-developed, female in no acute distress. HEENT: Normocephalic and atraumatic. Conjunctivae are normal. No scleral icterus. Neck supple.  Cardiovascular: Normal rate, regular rhythm.  Pulmonary/chest: Effort normal and breath sounds normal. No wheezing, rales or rhonchi. Abdominal: Soft, nondistended, obese abdomen nontender. Bowel sounds active throughout. There are no masses palpable.  Extremities: no edema Lymphadenopathy: No cervical adenopathy noted. Neurological: Alert and oriented to person place and time. Skin: Skin is warm and dry. No rashes noted. Psychiatric: Normal mood and affect. Behavior is normal.   ASSESSMENT AND PLAN: 70 y/o female with history as above, presenting with positive FIT test with no prior colonoscopy screening. I discussed ddx for FIT test positivity and recommend optical colonoscopy to further evaluate. I discussed the risks / benefits of colonoscopy and she wished to proceed. Further recommendations pending this result.   Warner Cellar, MD [facility] Pager [phone]  CC: Bernerd Limbo, MD

## 2016-01-31 NOTE — Patient Instructions (Signed)
If you are age 70 or older, your body mass index should be between 23-30. Your Body mass index is 34.58 kg/m. If this is out of the aforementioned range listed, please consider follow up with your Primary Care Provider.  If you are age 39 or younger, your body mass index should be between 19-25. Your Body mass index is 34.58 kg/m. If this is out of the aformentioned range listed, please consider follow up with your Primary Care Provider.   You have been scheduled for a colonoscopy. Please follow written instructions given to you at your visit today.  Please pick up your prep supplies at the pharmacy within the next 1-3 days. If you use inhalers (even only as needed), please bring them with you on the day of your procedure. Your physician has requested that you go to www.startemmi.com and enter the access code given to you at your visit today. This web site gives a general overview about your procedure. However, you should still follow specific instructions given to you by our office regarding your preparation for the procedure.

## 2016-02-01 ENCOUNTER — Encounter: Payer: Self-pay | Admitting: Gastroenterology

## 2016-02-01 ENCOUNTER — Ambulatory Visit (AMBULATORY_SURGERY_CENTER): Payer: Medicare Other | Admitting: Gastroenterology

## 2016-02-01 VITALS — BP 162/67 | HR 70 | Temp 98.0°F | Resp 19 | Ht 60.0 in | Wt 181.0 lb

## 2016-02-01 DIAGNOSIS — K621 Rectal polyp: Secondary | ICD-10-CM

## 2016-02-01 DIAGNOSIS — Z1211 Encounter for screening for malignant neoplasm of colon: Secondary | ICD-10-CM

## 2016-02-01 DIAGNOSIS — R195 Other fecal abnormalities: Secondary | ICD-10-CM

## 2016-02-01 DIAGNOSIS — D128 Benign neoplasm of rectum: Secondary | ICD-10-CM

## 2016-02-01 LAB — GLUCOSE, CAPILLARY
GLUCOSE-CAPILLARY: 105 mg/dL — AB (ref 65–99)
Glucose-Capillary: 101 mg/dL — ABNORMAL HIGH (ref 65–99)

## 2016-02-01 MED ORDER — SODIUM CHLORIDE 0.9 % IV SOLN: 500.0000 mL | INTRAVENOUS | Status: DC

## 2016-02-01 NOTE — Patient Instructions (Signed)
YOU HAD AN ENDOSCOPIC PROCEDURE TODAY AT THE [facility]:   Refer to the procedure report that was given to you for any specific questions about what was found during the examination.  If the procedure report does not answer your questions, please call your gastroenterologist to clarify.  If you requested that your care partner not be given the details of your procedure findings, then the procedure report has been included in a sealed envelope for you to review at your convenience later.  YOU SHOULD EXPECT: Some feelings of bloating in the abdomen. Passage of more gas than usual.  Walking can help get rid of the air that was put into your GI tract during the procedure and reduce the bloating. If you had a lower endoscopy (such as a colonoscopy or flexible sigmoidoscopy) you may notice spotting of blood in your stool or on the toilet paper. If you underwent a bowel prep for your procedure, you may not have a normal bowel movement for a few days.  Please Note:  You might notice some irritation and congestion in your nose or some drainage.  This is from the oxygen used during your procedure.  There is no need for concern and it should clear up in a day or so.  SYMPTOMS TO REPORT IMMEDIATELY:   Following lower endoscopy (colonoscopy or flexible sigmoidoscopy):  Excessive amounts of blood in the stool  Significant tenderness or worsening of abdominal pains  Swelling of the abdomen that is new, acute  Fever of 100F or higher    For urgent or emergent issues, a gastroenterologist can be reached at any hour by calling [phone].   DIET:  We do recommend a small meal at first, but then you may proceed to your regular diet.  Drink plenty of fluids but you should avoid alcoholic beverages for 24 hours.  ACTIVITY:  You should plan to take it easy for the rest of today and you should NOT DRIVE or use heavy machinery until tomorrow (because of the sedation medicines used during the test).     FOLLOW UP: Our staff will call the number listed on your records the next business day following your procedure to check on you and address any questions or concerns that you may have regarding the information given to you following your procedure. If we do not reach you, we will leave a message.  However, if you are feeling well and you are not experiencing any problems, there is no need to return our call.  We will assume that you have returned to your regular daily activities without incident.  If any biopsies were taken you will be contacted by phone or by letter within the next 1-3 weeks.  Please call us at [phone] if you have not heard about the biopsies in 3 weeks.    SIGNATURES/CONFIDENTIALITY: You and/or your care partner have signed paperwork which will be entered into your electronic medical record.  These signatures attest to the fact that that the information above on your After Visit Summary has been reviewed and is understood.  Full responsibility of the confidentiality of this discharge information lies with you and/or your care-partner.   Resume medications. Information given on polyps. 

## 2016-02-01 NOTE — Progress Notes (Signed)
Called to room to assist during endoscopic procedure.  Patient ID and intended procedure confirmed with present staff. Received instructions for my participation in the procedure from the performing physician.  

## 2016-02-01 NOTE — Progress Notes (Signed)
Report to PACU, RN, vss, BBS= Clear.  

## 2016-02-01 NOTE — Op Note (Signed)
[facility] Patient Name: Tonya Glass Procedure Date: 02/01/2016 10:12 AM MRN: [id number] Endoscopist: Remo Lipps P. Havery Moros , MD Age: 70 Referring MD:  Date of Birth: 1945-08-10 Gender: Female Account #: [id number] Procedure:                Colonoscopy Indications:              This is the patient's first colonoscopy, Positive                            fecal immunochemical test Medicines:                Monitored Anesthesia Care Procedure:                Pre-Anesthesia Assessment:                           - Prior to the procedure, a History and Physical                            was performed, and patient medications and                            allergies were reviewed. The patient's tolerance of                            previous anesthesia was also reviewed. The risks                            and benefits of the procedure and the sedation                            options and risks were discussed with the patient.                            All questions were answered, and informed consent                            was obtained. Prior Anticoagulants: The patient has                            taken aspirin, last dose was 1 day prior to                            procedure. ASA Grade Assessment: III - A patient                            with severe systemic disease. After reviewing the                            risks and benefits, the patient was deemed in                            satisfactory condition to undergo the procedure.  After obtaining informed consent, the colonoscope                            was passed under direct vision. Throughout the                            procedure, the patient's blood pressure, pulse, and                            oxygen saturations were monitored continuously. The                            Model CF-HQ190L [id number]) scope was introduced                            through the anus and  advanced to the the terminal                            ileum, with identification of the appendiceal                            orifice and IC valve. The colonoscopy was performed                            without difficulty. The patient tolerated the                            procedure well. The quality of the bowel                            preparation was good. The terminal ileum, ileocecal                            valve, appendiceal orifice, and rectum were                            photographed. Scope In: 10:17:46 AM Scope Out: 10:34:28 AM Scope Withdrawal Time: 0 hours 13 minutes 52 seconds  Total Procedure Duration: 0 hours 16 minutes 42 seconds  Findings:                 The perianal and digital rectal examinations were                            normal.                           A 3 mm polyp was found in the rectum. The polyp was                            sessile. The polyp was removed with a cold biopsy                            forceps. Resection and retrieval were complete.  The terminal ileum appeared normal.                           The exam was otherwise without abnormality on                            direct and retroflexion views. Complications:            No immediate complications. Estimated blood loss:                            Minimal. Estimated Blood Loss:     Estimated blood loss was minimal. Impression:               - One 3 mm polyp in the rectum, removed with a cold                            biopsy forceps. Resected and retrieved.                           - The examined portion of the ileum was normal.                           - The examination was otherwise normal on direct                            and retroflexion views.                           Overall, no significant polyp or mass lesion in                            regards to the patient's positive FIT. Recommendation:           - Patient has a contact number  available for                            emergencies. The signs and symptoms of potential                            delayed complications were discussed with the                            patient. Return to normal activities tomorrow.                            Written discharge instructions were provided to the                            patient.                           - Resume previous diet.                           - Continue present medications.                           -  Await pathology results.                           - Repeat colonoscopy is recommended for                            surveillance. The colonoscopy date will be                            determined after pathology results from today's                            exam become available for review. Remo Lipps P. Armbruster, MD 02/01/2016 10:38:26 AM This report has been signed electronically.

## 2016-02-02 ENCOUNTER — Telehealth: Payer: Self-pay

## 2016-02-02 NOTE — Telephone Encounter (Signed)
  Follow up Call-  Call back number 02/01/2016  Post procedure Call Back phone  # [phone]  Permission to leave phone message Yes  Some recent data might be hidden    Patient expressed how happy she was with the care that she received in the endoscopy center.   Patient questions:  Do you have a fever, pain , or abdominal swelling? No. Pain Score  0 *  Have you tolerated food without any problems? Yes.    Have you been able to return to your normal activities? Yes.    Do you have any questions about your discharge instructions: Diet   No. Medications  No. Follow up visit  No.  Do you have questions or concerns about your Care? No.  Actions: * If pain score is 4 or above: No action needed, pain <4.

## 2016-02-07 ENCOUNTER — Encounter: Payer: Self-pay | Admitting: Gastroenterology

## 2016-02-07 LAB — CUP PACEART REMOTE DEVICE CHECK
Battery Remaining Longevity: 72 mo
Battery Remaining Percentage: 100 %
Brady Statistic RV Percent Paced: 24 %
Lead Channel Impedance Value: 804 Ohm
Lead Channel Pacing Threshold Pulse Width: 0.4 ms
Lead Channel Setting Pacing Amplitude: 2 V
Lead Channel Setting Pacing Pulse Width: 0.4 ms
MDC IDC MSMT LEADCHNL RA IMPEDANCE VALUE: 597 Ohm
MDC IDC MSMT LEADCHNL RA PACING THRESHOLD AMPLITUDE: 0.7 V
MDC IDC PG SERIAL: 160873
MDC IDC SESS DTM: 20170912092100
MDC IDC SET LEADCHNL RV PACING AMPLITUDE: 2.5 V
MDC IDC SET LEADCHNL RV SENSING SENSITIVITY: 2.5 mV
MDC IDC STAT BRADY RA PERCENT PACED: 93 %

## 2016-03-20 ENCOUNTER — Encounter: Payer: Self-pay | Admitting: Cardiovascular Disease

## 2016-03-20 ENCOUNTER — Ambulatory Visit (INDEPENDENT_AMBULATORY_CARE_PROVIDER_SITE_OTHER): Payer: Medicare Other | Admitting: Cardiovascular Disease

## 2016-03-20 VITALS — BP 141/64 | HR 72 | Ht 61.0 in | Wt 178.4 lb

## 2016-03-20 DIAGNOSIS — Z952 Presence of prosthetic heart valve: Secondary | ICD-10-CM

## 2016-03-20 DIAGNOSIS — Z950 Presence of cardiac pacemaker: Secondary | ICD-10-CM | POA: Diagnosis not present

## 2016-03-20 DIAGNOSIS — I495 Sick sinus syndrome: Secondary | ICD-10-CM

## 2016-03-20 NOTE — Patient Instructions (Signed)
Dr Sallyanne Kuster recommends that you continue on your current medications as directed. Please refer to the Current Medication list given to you today.  Remote monitoring is used to monitor your Pacemaker of ICD from home. This monitoring reduces the number of office visits required to check your device to one time per year. It allows [place] to keep an eye on the functioning of your device to ensure it is working properly. You are scheduled for a device check from home on Tuesday, February 6th, 2018. You may send your transmission at any time that day. If you have a wireless device, the transmission will be sent automatically. After your physician reviews your transmission, you will receive a postcard with your next transmission date.  Dr Sallyanne Kuster recommends that you schedule a follow-up appointment in 12 months with a pacemaker check. You will receive a reminder letter in the mail two months in advance. If you don't receive a letter, please call our office to schedule the follow-up appointment.  If you need a refill on your cardiac medications before your next appointment, please call your pharmacy.

## 2016-03-20 NOTE — Progress Notes (Signed)
Cardiology Office Note    Date:  03/20/2016   ID:  Tonya Glass, DOB 1945-12-29, MRN [id number]  PCP:  Phineas Inches, MD  Cardiologist:  Kathy Breach, M.D.; Sanda Klein, MD   Chief Complaint  Patient presents with  . Follow-up    pt denied chest pain and SOB    History of Present Illness:  Tonya Glass is a 70 y.o. 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   Feels that she is doing "great". She readily acknowledges that she is very sedentary. This has been a lifelong characteristic and is worsened now by chronic back problems. Ace make her interrogation shows a low level of activity of only 0.8 hours per day, but this is unchanged since we first began taking care of her. Her heart rate histogram remains rather blunted, but is probably appropriate for her activity level. She has 93% atrial pacing and only 26% ventricular pacing. Interrogation of her pacemaker shows normal device function. Estimated generator longevity is 5.5 years.  She has had a few occasional episodes, maximum 6 minutes in duration, but no paroxysmal atrial fibrillation has been detected. There have been no high ventricular rate episodes.   The patient specifically denies any chest pain at rest exertion, dyspnea at rest or with exertion, orthopnea, paroxysmal nocturnal dyspnea, syncope, palpitations, focal neurological deficits, intermittent claudication, lower extremity edema, unexplained weight gain, cough, hemoptysis or wheezing.   Past Medical History:  Diagnosis Date  . Anxiety   . Arthritis    "fingers" (02/25/2013)  . Depression   . Gallstones   . Headache(784.0)   . Heart murmur   . High cholesterol   . Hypertension   . Hypothyroidism   . Migraines    "none in years til 01/2013" (02/25/2013)  . Orthopnoea    "just since open heart  surgery" (02/25/2013)  . Pacemaker    [employer]   . Stroke [facility]) 2001   denies residual on 02/25/2013  . Syncope and collapse    "prior to open heart surgery" (02/25/2013)  . Type II diabetes mellitus ([place])   . Walking pneumonia ~ 2012    Past Surgical History:  Procedure Laterality Date  . CARDIAC VALVE REPLACEMENT  01/26/2013   "cow valve put in; widened the second valve" (02/25/2013) - [place], [place]  . INSERT / REPLACE / REMOVE PACEMAKER  01/19/2013   [employer] - dual chamber  . [place] RESECTION  2009   in [place]    Current Medications: Outpatient Medications Prior to Visit  Medication Sig Dispense Refill  . albuterol (PROVENTIL HFA;VENTOLIN HFA) 108 (90 BASE) MCG/ACT inhaler Inhale 2 puffs into the lungs. Use as directed -  With exercise    . ALPRAZolam (XANAX) 0.25 MG tablet Take 0.25 mg by mouth daily as needed.     Marland Kitchen amLODipine (NORVASC) 5 MG tablet Take 1 tablet by mouth daily.    Marland Kitchen aspirin 325 MG tablet Take 325 mg by mouth daily.    Marland Kitchen atorvastatin (LIPITOR) 40 MG tablet Take 40 mg by mouth daily.    . Biotin 1 MG CAPS Take 1 capsule by mouth daily.    . carvedilol (COREG) 6.25 MG tablet Take 1 tablet by mouth 2 (two) times daily.    . cyanocobalamin 500 MCG tablet Take 500 mcg by mouth.    Marland Kitchen  FLUoxetine (PROZAC) 20 MG capsule Take 20 mg by mouth daily.    Marland Kitchen levothyroxine (SYNTHROID, LEVOTHROID) 50 MCG tablet Take 50 mcg by mouth daily before breakfast.    . metFORMIN (GLUCOPHAGE) 500 MG tablet Take 2 tablets by mouth 2 (two) times daily.    . Multiple Vitamins-Minerals (CENTRUM PO) Take 1 tablet by mouth daily.    Marland Kitchen olmesartan (BENICAR) 20 MG tablet Take 1.5 tablets (30 mg total) by mouth daily. 135 tablet 3  . Probiotic Product (PROBIOTIC DAILY) CAPS Take by mouth.    . traMADol (ULTRAM) 50 MG tablet Take 50 mg by mouth as needed.    . clonazePAM (KLONOPIN) 2 MG tablet Take 2 mg by mouth at bedtime.      Facility-Administered Medications Prior to Visit  Medication Dose Route Frequency Provider Last Rate Last Dose  . 0.9 %  sodium chloride infusion  500 mL Intravenous Continuous Manus Gunning, MD         Allergies:   Patient has no known allergies.   Social History   Social History  . Marital status: Single    Spouse name: N/A  . Number of children: 2  . Years of education: dental sch   Occupational History  . retired    Social History Main Topics  . Smoking status: Former Smoker    Packs/day: 2.00    Years: 40.00    Types: Cigarettes    Quit date: 05/29/1999  . Smokeless tobacco: Never Used     Comment: 02/25/2013 "quit smoking in 2001"  . Alcohol use No  . Drug use: No  . Sexual activity: No   Other Topics Concern  . Not on file   Social History Narrative  . No narrative on file     Family History:  The patient's family history includes Breast cancer in her maternal aunt; Colon polyps in her brother and daughter; Diabetes in her maternal aunt and paternal aunt; Heart Problems in her father; Hypertension in her brother; Lung cancer in her mother; Skin cancer in her brother.   ROS:   Please see the history of present illness.    ROS All other systems reviewed and are negative.   PHYSICAL EXAM:   VS:  BP (!) 141/64   Pulse 72   Ht 5\' 1"  (1.549 m)   Wt 178 lb 6.4 oz (80.9 kg)   BMI 33.71 kg/m    GEN: Well nourished, well developed, in no acute distress  HEENT: normal  Neck: no JVD, carotid bruits, or masses Cardiac: RRR; 2-3/6 decrescendo diastolic murmur of aortic insufficiency and 2/6 systolic early peaking ejection murmur, rubs, or gallops,no edema , healthy sternotomy and pacemaker scars Respiratory:  clear to auscultation bilaterally, normal work of breathing GI: soft, nontender, nondistended, + BS MS: no deformity or atrophy  Skin: warm and dry, no rash Neuro:  Alert and Oriented x 3, Strength and sensation are intact Psych: euthymic mood,  full affect  Wt Readings from Last 3 Encounters:  03/20/16 178 lb 6.4 oz (80.9 kg)  02/01/16 181 lb (82.1 kg)  01/31/16 181 lb 8 oz (82.3 kg)      Studies/Labs Reviewed:   EKG:  EKG is ordered today. It shows atrial paced, ventricular sensed rhythm with incomplete right bundle branch block and nonspecific ST-T changes.    ASSESSMENT:    1. SSS (sick sinus syndrome) (HCC)   2. Cardiac pacemaker in situ   3. S/P AVR      PLAN:  In order of problems listed above:  1. SSS: Tonya Glass does not have evidence of complete heart block but does have marked sinus node dysfunction. She is sedentary which explains her blunted heart rate histogram. I don't think changing the device settings any further would be beneficial 2. PPM: Minute ventilation sensor is already turned on and today we also activated her accelerometer  to see if this gives her extra exercise tolerance. Continue with remote downloads every 3 months and yearly office visits. 3. S/p AVR: She has mild aortic insufficiency by echocardiogram performed in May 2017. This is asymptomatic. The murmur is unexpectedly loud.    Medication Adjustments/Labs and Tests Ordered: Current medicines are reviewed at length with the patient today.  Concerns regarding medicines are outlined above.  Medication changes, Labs and Tests ordered today are listed in the Patient Instructions below. Patient Instructions  Dr Sallyanne Kuster recommends that you continue on your current medications as directed. Please refer to the Current Medication list given to you today.  Remote monitoring is used to monitor your Pacemaker of ICD from home. This monitoring reduces the number of office visits required to check your device to one time per year. It allows [place] to keep an eye on the functioning of your device to ensure it is working properly. You are scheduled for a device check from home on Tuesday, February 6th, 2018. You may send your transmission at any time  that day. If you have a wireless device, the transmission will be sent automatically. After your physician reviews your transmission, you will receive a postcard with your next transmission date.  Dr Sallyanne Kuster recommends that you schedule a follow-up appointment in 12 months with a pacemaker check. You will receive a reminder letter in the mail two months in advance. If you don't receive a letter, please call our office to schedule the follow-up appointment.  If you need a refill on your cardiac medications before your next appointment, please call your pharmacy.    Signed, Sanda Klein, MD  03/20/2016 3:19 PM    [address], [place], [place]  [address] Phone: [phone]; Fax: [phone]

## 2016-03-28 ENCOUNTER — Other Ambulatory Visit: Payer: Self-pay | Admitting: Family Medicine

## 2016-03-28 DIAGNOSIS — Z1231 Encounter for screening mammogram for malignant neoplasm of breast: Secondary | ICD-10-CM

## 2016-03-30 ENCOUNTER — Other Ambulatory Visit: Payer: Self-pay | Admitting: Family Medicine

## 2016-03-30 DIAGNOSIS — N644 Mastodynia: Secondary | ICD-10-CM

## 2016-04-04 ENCOUNTER — Ambulatory Visit
Admission: RE | Admit: 2016-04-04 | Discharge: 2016-04-04 | Disposition: A | Discharge status: Home / Self Care | Payer: Medicare Other | Source: Ambulatory Visit | Attending: Family Medicine | Admitting: Family Medicine

## 2016-04-04 DIAGNOSIS — N644 Mastodynia: Secondary | ICD-10-CM

## 2016-04-13 ENCOUNTER — Encounter: Payer: Medicare Other | Admitting: Cardiovascular Disease

## 2016-05-04 ENCOUNTER — Ambulatory Visit: Payer: Medicare Other

## 2016-06-02 LAB — CUP PACEART INCLINIC DEVICE CHECK
Implantable Pulse Generator Implant Date: 20140908
MDC IDC PG SERIAL: 160873
MDC IDC SESS DTM: 20180120140935

## 2016-06-19 ENCOUNTER — Telehealth: Payer: Self-pay | Admitting: Cardiology

## 2016-06-19 ENCOUNTER — Ambulatory Visit (INDEPENDENT_AMBULATORY_CARE_PROVIDER_SITE_OTHER): Payer: Medicare Other | Admitting: *Deleted

## 2016-06-19 DIAGNOSIS — I495 Sick sinus syndrome: Secondary | ICD-10-CM

## 2016-06-19 NOTE — Telephone Encounter (Signed)
Spoke with pt and reminded pt of remote transmission that is due today. Pt verbalized understanding.   

## 2016-06-20 NOTE — Progress Notes (Signed)
Remote pacemaker transmission.   

## 2016-06-22 ENCOUNTER — Encounter: Payer: Self-pay | Admitting: Cardiology

## 2016-06-22 LAB — CUP PACEART REMOTE DEVICE CHECK
Battery Remaining Longevity: 66 mo
Battery Remaining Percentage: 100 %
Brady Statistic RA Percent Paced: 94 %
Brady Statistic RV Percent Paced: 25 %
Date Time Interrogation Session: 20180206222700
Lead Channel Setting Pacing Amplitude: 2 V
Lead Channel Setting Pacing Pulse Width: 0.4 ms
Lead Channel Setting Sensing Sensitivity: 2.5 mV
MDC IDC MSMT LEADCHNL RA IMPEDANCE VALUE: 586 Ohm
MDC IDC MSMT LEADCHNL RA PACING THRESHOLD AMPLITUDE: 0.7 V
MDC IDC MSMT LEADCHNL RA PACING THRESHOLD PULSEWIDTH: 0.4 ms
MDC IDC MSMT LEADCHNL RV IMPEDANCE VALUE: 755 Ohm
MDC IDC PG IMPLANT DT: 20140908
MDC IDC PG SERIAL: 160873
MDC IDC SET LEADCHNL RV PACING AMPLITUDE: 2.5 V

## 2016-08-01 ENCOUNTER — Telehealth: Payer: Self-pay | Admitting: Internal Medicine

## 2016-08-01 NOTE — Telephone Encounter (Signed)
Returned call to patient - PCP told her she was in HF -- PCP did lab work (BNP) and faxed to our office -- will have MD review 3/22 She states she "feels fine" but she has a little cold She just now started on her "water pill" - she took 1/2 of a 20mg  tablet (10mg ) lasix -- her PCP told her to take this for 3 days originally and then she was told to take lasix for 5 days per PCP Patient states she has shortness of breath - not new or worse Patient was in [place] last week - thought she had an anxiety attack, overwhelmed when seeing family and had SOB then that took her a while to recover Denies dizziness, lightheadedness, chest pain, swelling Patient reports she weighs daily and her weight fluctuates between 172-174lbs Patient states she has a new inhaler that is working well for her.  Informed patient that she should take lasix as prescribed by PCP and monitor weights and we will contact her tomorrow or as soon as MD advises   Message routed to MD for further advice

## 2016-08-01 NOTE — Telephone Encounter (Signed)
Patient will come in for appt 08/02/16

## 2016-08-01 NOTE — Telephone Encounter (Signed)
Would try to get her in for an appointment this week - either with me or an APP. Thanks.  Dr. Lemmie Evens

## 2016-08-01 NOTE — Telephone Encounter (Signed)
LM for patient that MD would like her to be seen and she is scheduled for 08/01/16 @ 9:45am. Asked that she call back to confirm receipt of call and if she can/cannot make this appt.

## 2016-08-01 NOTE — Telephone Encounter (Signed)
Pt saw her primary care doctor yesterday. He said pt is in CHF,she has a lot of fluid around her heart. Pt probably needs an appointment,first available appt is on 08-10-16.

## 2016-08-02 ENCOUNTER — Ambulatory Visit (INDEPENDENT_AMBULATORY_CARE_PROVIDER_SITE_OTHER): Payer: Medicare Other | Admitting: Internal Medicine

## 2016-08-02 ENCOUNTER — Encounter: Payer: Self-pay | Admitting: Internal Medicine

## 2016-08-02 VITALS — BP 154/62 | HR 72 | Ht 61.0 in | Wt 169.0 lb

## 2016-08-02 DIAGNOSIS — Z952 Presence of prosthetic heart valve: Secondary | ICD-10-CM | POA: Diagnosis not present

## 2016-08-02 DIAGNOSIS — R0602 Shortness of breath: Secondary | ICD-10-CM | POA: Diagnosis not present

## 2016-08-02 DIAGNOSIS — Z79899 Other long term (current) drug therapy: Secondary | ICD-10-CM

## 2016-08-02 DIAGNOSIS — I38 Endocarditis, valve unspecified: Secondary | ICD-10-CM | POA: Diagnosis not present

## 2016-08-02 DIAGNOSIS — I509 Heart failure, unspecified: Secondary | ICD-10-CM | POA: Diagnosis not present

## 2016-08-02 MED ORDER — FUROSEMIDE 20 MG PO TABS: 20.0000 mg | ORAL_TABLET | Freq: Every day | ORAL | 1 refills | Status: DC

## 2016-08-02 NOTE — Patient Instructions (Addendum)
Your physician has requested that you have an echocardiogram @ [address] - [address]rd Floor. Echocardiography is a painless test that uses sound waves to create images of your heart. It provides your doctor with information about the size and shape of your heart and how well your heart's chambers and valves are working. This procedure takes approximately one hour. There are no restrictions for this procedure.  Your physician recommends that you return for lab work - Monday 08/06/2016  Dr. Debara Pickett recommends that you follow up after your echocardiogram - in a few weeks

## 2016-08-02 NOTE — Progress Notes (Signed)
OFFICE NOTE  Chief Complaint:  Acute shortness of breath with exertion  Primary Care Physician: Phineas Inches, MD  HPI:  Tonya Glass is a pleasant 71 year old female who recently relocated to [place]. She has been living for the past 26 years in [place], where she moved with her husband who is an Training and development officer. 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 For some reason she was being followed by a doctor in [place] who recommended referral to a cardiologist for episodes of dizziness that she was experiencing. Ultimately she saw doctors in [facility] and apparently underwent placement of a pacemaker and at some point had cardiac surgery. She initially reported that she had 2 valves replaced, however subsequently said that she was told that she had a blood vessel that was go "going to burst".  This sounds somewhat like an aneurysm and I wonder if she had a [place] procedure.  We are still trying to obtain these records. She does have a history of gastric sleeve procedure in 2011 for which she lost weight coming from 330 pounds down to 142 pounds. She also made significant dietary changes. Postoperatively she was quite short of breath and required some recovery and therefore is in [place] living with her children.  Mrs. Marini returns today for followup. Since seeing her last, she has been seen by her primary care provider about 3 times for adjustment of her medications. She sees to be having problems with low blood pressure with diastolics in the 69I and 50T. Systolic to been in the 888-280 range. He stopped to her blood pressure medications. Today in the office her blood pressure is elevated at 179/73. She is continuing to take carvedilol 6.25 mg twice daily.  I had the pleasure seeing Jonisha Kindig back in the office today. Overall she is doing well denies any chest pain or worsening shortness of breath. She's had no issues regarding her pacemaker which is  remotely monitor. She has a an appointment scheduled to see Dr. Loletha Grayer next month. She had repeat echocardiography 2 years ago to assess her valve which is a 23 mm MagnaEase aortic bioprosthetic. She also had concomitant aortic root replacement at the time. Blood pressure appears well controlled on current regimen.  09/16/2015  Mrs. Starry returns for follow-up. She was seen last year at which time I made some adjustments on her medications. She was on both ACE and ARB and I discontinued her 62s. I increased her ARB slightly and blood pressures remain stable. Clinically she is asymptomatic, denying any chest pain or worsening shortness of breath. I recommended an echocardiogram at her last office visit but that was never performed. Recently she had lab work through her primary care provider which I was able to view on care everywhere. Cholesterol was well controlled with LDL around 70. Blood pressure is again is at goal. Her pacemaker is being followed by remote checks and she has appointment with Dr. Loletha Grayer in June for further evaluation. EKG today shows an atrial paced rhythm at 75.  08/02/2016  Mrs. Holderman was referred today for follow-up after being seen by her care provider 2 days ago for acute shortness of breath thought to be systolic congestive heart failure. She reports she recently got back from a trip to [place]. While she was there she thought she had an upper respiratory infection as she was more short of breath but denied any fever or cough or chills. After returning she saw her  primary care provider and he ran tests including a pro-BNP which was elevated greater than 4000. A chest x-ray was performed which did not show any overt pulmonary edema. She was started on Lasix 20 mg daily which is taken for a couple of days and has been urinating frequently. She reports her breathing has improved. She takes his symptoms have come on fairly suddenly, but that she was not concerned because she's been  short of breath to some degree for many years. Weight is decreased from 174-169 over the past couple of days.  PMHx:  Past Medical History:  Diagnosis Date  . Anxiety   . Arthritis    "fingers" (02/25/2013)  . Depression   . Gallstones   . Headache(784.0)   . Heart murmur   . High cholesterol   . Hypertension   . Hypothyroidism   . Migraines    "none in years til 01/2013" (02/25/2013)  . Orthopnoea    "just since open heart surgery" (02/25/2013)  . Pacemaker    [employer]   . Stroke [facility]) 2001   denies residual on 02/25/2013  . Syncope and collapse    "prior to open heart surgery" (02/25/2013)  . Type II diabetes mellitus ([place])   . Walking pneumonia ~ 2012    Past Surgical History:  Procedure Laterality Date  . CARDIAC VALVE REPLACEMENT  01/26/2013   "cow valve put in; widened the second valve" (02/25/2013) - [place], [place]  . INSERT / REPLACE / REMOVE PACEMAKER  01/19/2013   [employer] - dual chamber  . LAPAROSCOPIC GASTRIC SLEEVE RESECTION  2009   in [place]    FAMHx:  Family History  Problem Relation Age of Onset  . Lung cancer Mother   . Heart Problems Father   . Hypertension Brother   . Colon polyps Brother   . Skin cancer Brother   . Colon polyps Daughter   . Diabetes Maternal Aunt   . Breast cancer Maternal Aunt     mets to liver  . Diabetes Paternal Aunt     SOCHx:   reports that she quit smoking about 17 years ago. Her smoking use included Cigarettes. She has a 80.00 pack-year smoking history. She has never used smokeless tobacco. She reports that she does not drink alcohol or use drugs.  ALLERGIES:  No Known Allergies  ROS: Pertinent items noted in HPI and remainder of comprehensive ROS otherwise negative.  HOME MEDS: Current Outpatient Prescriptions  Medication Sig Dispense Refill  . albuterol (PROVENTIL HFA;VENTOLIN HFA) 108 (90 BASE) MCG/ACT inhaler Inhale 2 puffs into the lungs. Use as directed -  With  exercise    . ALPRAZolam (XANAX) 0.25 MG tablet Take 0.25 mg by mouth daily as needed.     Marland Kitchen amLODipine (NORVASC) 5 MG tablet Take 1 tablet by mouth daily.    Marland Kitchen aspirin 325 MG tablet Take 325 mg by mouth daily.    Marland Kitchen atorvastatin (LIPITOR) 40 MG tablet Take 40 mg by mouth daily.    . carvedilol (COREG) 6.25 MG tablet Take 1 tablet by mouth 2 (two) times daily.    . clonazePAM (KLONOPIN) 1 MG tablet TAKE ONE TABLET BY MOUTH NIGHTLY AS NEEDED FOR ANXIETY    . cyanocobalamin 500 MCG tablet Take 500 mcg by mouth.    Marland Kitchen FLUoxetine (PROZAC) 20 MG capsule Take 20 mg by mouth daily.    . furosemide (LASIX) 20 MG tablet Take 1 tablet (20 mg total) by  mouth daily. 90 tablet 1  . levothyroxine (SYNTHROID, LEVOTHROID) 50 MCG tablet Take 50 mcg by mouth daily before breakfast.    . metFORMIN (GLUCOPHAGE) 500 MG tablet Take 2 tablets by mouth 2 (two) times daily.    . Multiple Vitamins-Minerals (CENTRUM PO) Take 1 tablet by mouth daily.    Marland Kitchen olmesartan (BENICAR) 20 MG tablet Take 1.5 tablets (30 mg total) by mouth daily. 135 tablet 3  . Probiotic Product (PROBIOTIC DAILY) CAPS Take by mouth.    . Tiotropium Bromide-Olodaterol (STIOLTO RESPIMAT) 2.5-2.5 MCG/ACT AERS Inhale 2 puffs into the lungs daily.    . traMADol (ULTRAM) 50 MG tablet Take 50 mg by mouth as needed.     Current Facility-Administered Medications  Medication Dose Route Frequency Provider Last Rate Last Dose  . 0.9 %  sodium chloride infusion  500 mL Intravenous Continuous Manus Gunning, MD        LABS/IMAGING: No results found for this or any previous visit (from the past 48 hour(s)). No results found.  VITALS: BP (!) 154/62   Pulse 72   Ht 5\' 1"  (1.549 m)   Wt 169 lb (76.7 kg)   BMI 31.93 kg/m   EXAM: General appearance: alert and no distress Neck: JVD - 5 cm above sternal notch and no carotid bruit Lungs: diminished breath sounds bilaterally and rales RLL Heart: regular rate and rhythm, S1, S2 normal and diastolic  murmur: mid diastolic 3/6, blowing at 2nd right intercostal space Abdomen: soft, non-tender; bowel sounds normal; no masses,  no organomegaly Extremities: extremities normal, atraumatic, no cyanosis or edema Pulses: 2+ and symmetric Skin: Skin color, texture, turgor normal. No rashes or lesions Neurologic: Grossly normal Psych: Normal  EKG: Atrial paced rhythm at 75  ASSESSMENT: 1. Acute congestive heart failure - possibly related to acute aortic insufficiency 2. S/p [employer] pacemaker - complete heart block 3. ? Paroxysmal atrial flutter - no high rate episodes noted on her pacemaker  4. 01/26/13 - Aortic valve replacement with 23 mm Perimount Magna  Ease bovine pericardial bioprosthesis by [place] and ascending aortic  replacement with 28 mm Terumo interposition graft ([facility]) 5. HTN - labile 6. Hyperlipidemia 7. DM2 8. 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 Labile hypertension 10. CAD - 40% mid-LAD stenosis  PLAN: 1.   Mrs. Pinkney does appear to have had acute congestive heart failure with a high proBNP, elevated jugular venous pressure, basilar rales and evidence of improvement on Lasix. Exam today indicates a new diastolic murmur which is fairly loud, suggesting she may have more significant aortic insufficiency or perhaps early failure of her bioprosthetic aortic valve. There is some degree of systolic murmur is well suggesting there may be stenosis. I would like to get another echocardiogram to assess this. EF was 55-60% by echo here in May 2017, with grade 2 diastolic dysfunction. There was some mild stenosis and regurgitation of her bioprosthetic aortic valve at that time.  Follow-up with me in a few weeks after her echo.  Pixie Casino, MD, [facility] Attending Cardiologist Elmo C Marzetta Lanza 08/02/2016, 1:24 PM

## 2016-08-06 LAB — BASIC METABOLIC PANEL
BUN/Creatinine Ratio: 32 — ABNORMAL HIGH (ref 12–28)
BUN: 25 mg/dL (ref 8–27)
CALCIUM: 9.7 mg/dL (ref 8.7–10.3)
CO2: 28 mmol/L (ref 18–29)
Chloride: 100 mmol/L (ref 96–106)
Creatinine, Ser: 0.79 mg/dL (ref 0.57–1.00)
GFR calc Af Amer: 88 mL/min/{1.73_m2} (ref >59)
GFR, EST NON AFRICAN AMERICAN: 76 mL/min/{1.73_m2} (ref >59)
Glucose: 107 mg/dL — ABNORMAL HIGH (ref 65–99)
POTASSIUM: 5 mmol/L (ref 3.5–5.2)
Sodium: 142 mmol/L (ref 134–144)

## 2016-08-07 ENCOUNTER — Telehealth: Payer: Self-pay | Admitting: Internal Medicine

## 2016-08-07 LAB — PRO B NATRIURETIC PEPTIDE: NT-Pro BNP: 2468 pg/mL — ABNORMAL HIGH (ref 0–301)

## 2016-08-07 NOTE — Telephone Encounter (Signed)
Per pt call she has some questions about her water pill and if she should keep taking it.   She took the last one as requested yesterday her 5th per request.

## 2016-08-07 NOTE — Telephone Encounter (Signed)
F/U Call:  Patient is calling back. Thanks.

## 2016-08-07 NOTE — Telephone Encounter (Signed)
Spoke with pt states that she was told by Dr Debara Pickett to take the lasix for 5 days and she has now taken it for 5 days. Also, BNP result? Pt was asking because she states that her PCP told her it was really high. Please advise

## 2016-08-07 NOTE — Telephone Encounter (Signed)
Pro BNP has resulted in EPIC. Will await provider review and feedback on med instructions

## 2016-08-08 ENCOUNTER — Encounter: Payer: Self-pay | Admitting: *Deleted

## 2016-08-08 NOTE — Telephone Encounter (Signed)
See result note, will close encounter.

## 2016-08-08 NOTE — Telephone Encounter (Signed)
-----   Message from Pixie Casino, MD sent at 08/08/2016 12:24 PM EDT ----- Pro-BNP suggests volume overload as present in physical exam. Would increase lasix to 20 mg BID.  Dr. Lemmie Evens

## 2016-08-08 NOTE — Telephone Encounter (Signed)
This encounter was created in error - please disregard.

## 2016-08-15 ENCOUNTER — Telehealth: Payer: Self-pay | Admitting: Internal Medicine

## 2016-08-15 NOTE — Telephone Encounter (Signed)
Tonya Glass is calling because her  PCP   Says that he blood pressure is low . Please call

## 2016-08-15 NOTE — Telephone Encounter (Signed)
Pt states yes, and will cut down on lasix to 20mg  daily again. Aware to call if new concerns. Voiced thanks for call.

## 2016-08-15 NOTE — Telephone Encounter (Signed)
If her shortness of breath has improved, she can decrease lasix to 20 mg daily.  Dr Lemmie Evens

## 2016-08-15 NOTE — Telephone Encounter (Signed)
Pt of Dr. Debara Pickett Seen by PCP yesterday 108/58 HR 80 at office.  She took her BP on and off yesterday after getting home, "wasn't very good" Syst BP readings: 101/, 99/, 159/, "just jumped around".  She also reports diastolic numbers in high 27C/62B.  129/59 HR 80 this AM. Discussed and the 159/ reading was taken after "running around".   Patient denies acute symptoms. Reporting BPs at behest of PCP.  Notes fatigue (usually in afternoon) but this is nothing new and has been the case "all of my adult life".  Shares that she's lost 8-9 lbs since initiation of lasix. She does report dry mouth and feels she may be a little dehydrated since upping dose to 20mg  BID per instructions (see lab result note). Wonders if this might be affecting BP. Pt paid particular concern to the low diastolic readings.  Also of note; she shares that she's stopped drinking caffeine/sodas entirely since my discussion w her last week, and has replaced this with water. I congratulated her on this and advised her to continue.  Pending echo on 4/9.  Advised patient to continue to check BP 1-2 times daily. Will see if Dr. Debara Pickett has recommendations prior to echo.

## 2016-08-15 NOTE — Telephone Encounter (Signed)
Mrs. Cronk is calling because her PCP discovered that her blood pressure is low . Her BP is 139/59 pulse 80 on this morning .  Please call .Marland Kitchen Thanks

## 2016-08-20 ENCOUNTER — Other Ambulatory Visit: Payer: Self-pay

## 2016-08-20 ENCOUNTER — Ambulatory Visit (HOSPITAL_COMMUNITY): Payer: Medicare Other | Attending: Cardiovascular Disease

## 2016-08-20 DIAGNOSIS — Z952 Presence of prosthetic heart valve: Secondary | ICD-10-CM | POA: Diagnosis not present

## 2016-08-20 DIAGNOSIS — I350 Nonrheumatic aortic (valve) stenosis: Secondary | ICD-10-CM | POA: Diagnosis not present

## 2016-08-20 DIAGNOSIS — I509 Heart failure, unspecified: Secondary | ICD-10-CM | POA: Diagnosis not present

## 2016-08-20 DIAGNOSIS — I351 Nonrheumatic aortic (valve) insufficiency: Secondary | ICD-10-CM | POA: Diagnosis not present

## 2016-08-20 LAB — ECHOCARDIOGRAM COMPLETE
AO mean calculated velocity dopler: 216 cm/s
AV Mean grad: 21 mmHg
AV Peak grad: 41 mmHg
AV VEL mean LVOT/AV: 0.32
AV pk vel: 320 cm/s
Ao pk vel: 0.33 m/s
E decel time: 165 msec
E/e' ratio: 10.59
FS: 28 % (ref 28–44)
IVS/LV PW RATIO, ED: 0.9
LA ID, A-P, ES: 35 mm
LA diam end sys: 35 mm
LA diam index: 1.99 cm/m2
LA vol A4C: 32.6 ml
LA vol index: 18.5 mL/m2
LA vol: 32.5 mL
LV E/e' medial: 10.59
LV E/e'average: 10.59
LV PW d: 10 mm — AB (ref 0.6–1.1)
LV e' LATERAL: 10.1 cm/s
LVOT VTI: 22.6 cm
LVOT peak VTI: 0.33 cm
LVOT peak vel: 105 cm/s
Lateral S' vel: 7.29 cm/s
MV Dec: 165
MV Peak grad: 5 mmHg
MV pk A vel: 93.1 m/s
MV pk E vel: 107 m/s
P 1/2 time: 456 ms
RV sys press: 32 mmHg
Reg peak vel: 269 cm/s
TAPSE: 12.6 mm
TDI e' lateral: 10.1
TDI e' medial: 7.29
TR max vel: 269 cm/s
VTI: 67.8 cm

## 2016-08-22 ENCOUNTER — Telehealth: Payer: Self-pay | Admitting: Internal Medicine

## 2016-08-22 NOTE — Telephone Encounter (Signed)
New Message  Pt voiced calling to get results from Echo.  Please f/u

## 2016-08-22 NOTE — Telephone Encounter (Signed)
Returned call to patient and notified her of echo results.   She also states she is worried about her low BP (triage call earlier this month referencing this). She states she feels fine. She states she was advised by PCP to take lasix QOD which she is doing and does not have shortness of breath. Advised to continue meds as directed and record BP/HR if able and bring readings to May 1 appt for MD to review. She voiced understanding.

## 2016-08-28 ENCOUNTER — Encounter: Payer: Self-pay | Admitting: Internal Medicine

## 2016-08-28 ENCOUNTER — Ambulatory Visit (INDEPENDENT_AMBULATORY_CARE_PROVIDER_SITE_OTHER): Payer: Medicare Other | Admitting: Internal Medicine

## 2016-08-28 VITALS — BP 130/54 | HR 69 | Ht 61.0 in | Wt 163.8 lb

## 2016-08-28 DIAGNOSIS — I495 Sick sinus syndrome: Secondary | ICD-10-CM

## 2016-08-28 DIAGNOSIS — Z952 Presence of prosthetic heart valve: Secondary | ICD-10-CM

## 2016-08-28 DIAGNOSIS — R0602 Shortness of breath: Secondary | ICD-10-CM

## 2016-08-28 DIAGNOSIS — Z950 Presence of cardiac pacemaker: Secondary | ICD-10-CM | POA: Diagnosis not present

## 2016-08-28 NOTE — Patient Instructions (Signed)
Your physician recommends that you schedule a follow-up appointment in: AUGUST 2018 with Dr. Debara Pickett

## 2016-08-28 NOTE — Progress Notes (Signed)
Grossly normal   OFFICE NOTE  Chief Complaint:  Follow-up  Primary Care Physician: Phineas Inches, MD  HPI:  Tonya Glass is a pleasant 71 year old female who recently relocated to [place]. She has been living for the past 26 years in [place], where she moved with her husband who is an Training and development officer. 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 For some reason she was being followed by a doctor in [place] who recommended referral to a cardiologist for episodes of dizziness that she was experiencing. Ultimately she saw doctors in [facility] and apparently underwent placement of a pacemaker and at some point had cardiac surgery. She initially reported that she had 2 valves replaced, however subsequently said that she was told that she had a blood vessel that was go "going to burst".  This sounds somewhat like an aneurysm and I wonder if she had a Bentall procedure.  We are still trying to obtain these records. She does have a history of gastric sleeve procedure in 2011 for which she lost weight coming from 330 pounds down to 142 pounds. She also made significant dietary changes. Postoperatively she was quite short of breath and required some recovery and therefore is in [place] living with her children.  Tonya Glass returns today for followup. Since seeing her last, she has been seen by her primary care provider about 3 times for adjustment of her medications. She sees to be having problems with low blood pressure with diastolics in the 68T and 41D. Systolic to been in the 622-297 range. He stopped to her blood pressure medications. Today in the office her blood pressure is elevated at 179/73. She is continuing to take carvedilol 6.25 mg twice daily.  I had the pleasure seeing Tonya Glass back in the office today. Overall she is doing well denies any chest pain or worsening shortness of breath. She's had no issues regarding her pacemaker which is remotely monitor.  She has a an appointment scheduled to see Dr. Loletha Grayer next month. She had repeat echocardiography 2 years ago to assess her valve which is a 23 mm MagnaEase aortic bioprosthetic. She also had concomitant aortic root replacement at the time. Blood pressure appears well controlled on current regimen.  09/16/2015  Tonya Glass returns for follow-up. She was seen last year at which time I made some adjustments on her medications. She was on both ACE and ARB and I discontinued her 71s. I increased her ARB slightly and blood pressures remain stable. Clinically she is asymptomatic, denying any chest pain or worsening shortness of breath. I recommended an echocardiogram at her last office visit but that was never performed. Recently she had lab work through her primary care provider which I was able to view on care everywhere. Cholesterol was well controlled with LDL around 70. Blood pressure is again is at goal. Her pacemaker is being followed by remote checks and she has appointment with Dr. Loletha Grayer in June for further evaluation. EKG today shows an atrial paced rhythm at 75.  08/02/2016  Tonya Glass was referred today for follow-up after being seen by her care provider 2 days ago for acute shortness of breath thought to be systolic congestive heart failure. She reports she recently got back from a trip to [place]. While she was there she thought she had an upper respiratory infection as she was more short of breath but denied any fever or cough or chills. After returning she saw her primary care provider  and he ran tests including a pro-BNP which was elevated greater than 4000. A chest x-ray was performed which did not show any overt pulmonary edema. She was started on Lasix 20 mg daily which is taken for a couple of days and has been urinating frequently. She reports her breathing has improved. She takes his symptoms have come on fairly suddenly, but that she was not concerned because she's been short of breath to  some degree for many years. Weight is decreased from 174-169 over the past couple of days.  08/28/2016  Tonya Glass was seen today in follow-up. She reports marked improvement in her breathing with good diuresis in the 6 pound weight loss. She's also made dietary changes and is working on losing weight. She's decreased her Lasix back to 20 mg every other day after her weight loss as her breathing is improved and lower extremity swelling is improved. We repeated her echocardiogram which unfortunate shows some worsening of her bioprosthetic aortic valve. There is now moderate stenosis with mild to moderate regurgitation. She does have a widened pulse pressure with a diastolic blood pressure in the 50s and sometimes the 40s. We'll need to watch his closely and consider decreasing her blood pressure medication if necessary. Otherwise she feels excellent today.  PMHx:  Past Medical History:  Diagnosis Date  . Anxiety   . Arthritis    "fingers" (02/25/2013)  . Depression   . Gallstones   . Headache(784.0)   . Heart murmur   . High cholesterol   . Hypertension   . Hypothyroidism   . Migraines    "none in years til 01/2013" (02/25/2013)  . Orthopnoea    "just since open heart surgery" (02/25/2013)  . Pacemaker    [employer]   . Stroke [facility]) 2001   denies residual on 02/25/2013  . Syncope and collapse    "prior to open heart surgery" (02/25/2013)  . Type II diabetes mellitus ([place])   . Walking pneumonia ~ 2012    Past Surgical History:  Procedure Laterality Date  . CARDIAC VALVE REPLACEMENT  01/26/2013   "cow valve put in; widened the second valve" (02/25/2013) - [place], [place]  . INSERT / REPLACE / REMOVE PACEMAKER  01/19/2013   [employer] - dual chamber  . LAPAROSCOPIC GASTRIC SLEEVE RESECTION  2009   in [place]    FAMHx:  Family History  Problem Relation Age of Onset  . Lung cancer Mother   . Heart Problems Father   . Hypertension Brother     . Colon polyps Brother   . Skin cancer Brother   . Colon polyps Daughter   . Diabetes Maternal Aunt   . Breast cancer Maternal Aunt     mets to liver  . Diabetes Paternal Aunt     SOCHx:   reports that she quit smoking about 17 years ago. Her smoking use included Cigarettes. She has a 80.00 pack-year smoking history. She has never used smokeless tobacco. She reports that she does not drink alcohol or use drugs.  ALLERGIES:  No Known Allergies  ROS: Pertinent items noted in HPI and remainder of comprehensive ROS otherwise negative.  HOME MEDS: Current Outpatient Prescriptions  Medication Sig Dispense Refill  . albuterol (PROVENTIL HFA;VENTOLIN HFA) 108 (90 BASE) MCG/ACT inhaler Inhale 2 puffs into the lungs. Use as directed -  With exercise    . ALPRAZolam (XANAX) 0.25 MG tablet Take 0.25 mg by mouth daily as needed.     Marland Kitchen  amLODipine (NORVASC) 5 MG tablet Take 1 tablet by mouth daily.    Marland Kitchen aspirin 325 MG tablet Take 325 mg by mouth daily.    Marland Kitchen atorvastatin (LIPITOR) 40 MG tablet Take 40 mg by mouth daily.    . carvedilol (COREG) 6.25 MG tablet Take 1 tablet by mouth 2 (two) times daily.    . clonazePAM (KLONOPIN) 1 MG tablet TAKE ONE TABLET BY MOUTH NIGHTLY AS NEEDED FOR ANXIETY    . cyanocobalamin 500 MCG tablet Take 500 mcg by mouth.    Marland Kitchen FLUoxetine (PROZAC) 20 MG capsule Take 20 mg by mouth daily.    . furosemide (LASIX) 20 MG tablet Take 1 tablet (20 mg total) by mouth daily. 90 tablet 1  . levothyroxine (SYNTHROID, LEVOTHROID) 50 MCG tablet Take 50 mcg by mouth daily before breakfast.    . metFORMIN (GLUCOPHAGE) 500 MG tablet Take 2 tablets by mouth 2 (two) times daily.    . Multiple Vitamins-Minerals (CENTRUM PO) Take 1 tablet by mouth daily.    Marland Kitchen olmesartan (BENICAR) 20 MG tablet Take 1.5 tablets (30 mg total) by mouth daily. 135 tablet 3  . Probiotic Product (PROBIOTIC DAILY) CAPS Take by mouth.    . Tiotropium Bromide-Olodaterol (STIOLTO RESPIMAT) 2.5-2.5 MCG/ACT AERS  Inhale 2 puffs into the lungs daily.    . traMADol (ULTRAM) 50 MG tablet Take 50 mg by mouth as needed.     Current Facility-Administered Medications  Medication Dose Route Frequency Provider Last Rate Last Dose  . 0.9 %  sodium chloride infusion  500 mL Intravenous Continuous Tonya Gunning, MD        LABS/IMAGING: No results found for this or any previous visit (from the past 48 hour(s)). No results found.  VITALS: BP (!) 130/54   Pulse 69   Ht 5\' 1"  (1.549 m)   Wt 163 lb 12.8 oz (74.3 kg)   SpO2 96%   BMI 30.95 kg/m   EXAM: General appearance: alert and no distress Neck: no carotid bruit and no JVD Lungs: clear to auscultation bilaterally Heart: regular rate and rhythm Abdomen: soft, non-tender; bowel sounds normal; no masses,  no organomegaly Extremities: extremities normal, atraumatic, no cyanosis or edema Pulses: 2+ and symmetric Skin: Skin color, texture, turgor normal. No rashes or lesions Neurologic: GroPsych: Normaly normal Psych: Normal  EKG: Deferred  ASSESSMENT: 1. Acute congestive heart failure - possibly related to acute aortic insufficiency 2. S/p [employer] pacemaker - complete heart block 3. ? Paroxysmal atrial flutter - no high rate episodes noted on her pacemaker  4. 01/26/13 - Aortic valve replacement with 23 mm Perimount Magna  Ease bovine pericardial bioprosthesis by [facility] and ascending aortic  replacement with 28 mm Terumo interposition graft ([facility]) 5. HTN - labile 6. Hyperlipidemia 7. DM2 8. 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 Labile hypertension 10. CAD - 40% mid-LAD stenosis  PLAN: 1.   Mrs. Heidler has had clinical improvement in acute diastolic congestive failure, possibly related to worsening aortic insufficiency. Her echo shows now moderate stenosis of her aortic valve and mild to moderate insufficiency. We'll need to follow this very closely as this could represent early bioprosthetic graft  dysfunction. Given the 23 mm size of valve she could be a possible candidate for valve and valve procedure in the future if she needs it. Plan to continue current medications. I've counseled her again on salt avoidance, particularly [place] food which she would eat a lot of in the past. Follow-up with  me in 4 months after she returns from her trip to [place].  Pixie Casino, MD, [facility] Attending Cardiologist [place] C Hilty 08/28/2016, 2:41 PM

## 2016-09-02 ENCOUNTER — Other Ambulatory Visit: Payer: Self-pay | Admitting: Internal Medicine

## 2016-09-02 DIAGNOSIS — I10 Essential (primary) hypertension: Secondary | ICD-10-CM

## 2016-09-07 ENCOUNTER — Telehealth: Payer: Self-pay | Admitting: Internal Medicine

## 2016-09-07 NOTE — Telephone Encounter (Signed)
Pt states she feels dizziness, HA (lasts less than 5 minutes in the am around her temple area) and lightheadedness that only lasts a few seconds but she states that she was running around all day and didn't eat all day until 3pm. Then again before eating dinner at 7pm. Pt states that she is very concerned about this. Denies any other symptoms and is taking medications as ordered. This happens intermittently throughout the day seems to be mostly when she is "running around". Please advise

## 2016-09-07 NOTE — Telephone Encounter (Signed)
Pt.notified

## 2016-09-07 NOTE — Telephone Encounter (Signed)
Would not change medicines based on this. Advise regular meals. Monitor symptoms.  Dr. Lemmie Evens

## 2016-09-07 NOTE — Telephone Encounter (Signed)
New message    Pt is calling to inform Dr. Debara Pickett she feels lightheaded when she has been running around. She said it's been going about a week.   Pt c/o BP issue: STAT if pt c/o blurred vision, one-sided weakness or slurred speech  1. What are your last 5 BP readings? 137/53 p- 70  2. Are you having any other symptoms (ex. Dizziness, headache, blurred vision, passed out)? lightheaded  3. What is your BP issue? Pt was told to call and let Dr. Debara Pickett know when she feels lightheaded.

## 2016-09-11 ENCOUNTER — Ambulatory Visit: Payer: Medicare Other | Admitting: Internal Medicine

## 2016-09-18 ENCOUNTER — Ambulatory Visit (INDEPENDENT_AMBULATORY_CARE_PROVIDER_SITE_OTHER): Payer: Medicare Other | Admitting: *Deleted

## 2016-09-18 ENCOUNTER — Telehealth: Payer: Self-pay | Admitting: Cardiology

## 2016-09-18 DIAGNOSIS — I495 Sick sinus syndrome: Secondary | ICD-10-CM

## 2016-09-18 NOTE — Telephone Encounter (Signed)
Pt called and left a voice message indicating that she needed help with her remote appt. Pt informed me that she had already taking care of it and no longer needed assistance. I informed pt that we did receive her remote transmission.

## 2016-09-18 NOTE — Progress Notes (Signed)
Remote pacemaker transmission.   

## 2016-09-20 LAB — CUP PACEART REMOTE DEVICE CHECK
Battery Remaining Longevity: 66 mo
Battery Remaining Percentage: 100 %
Implantable Pulse Generator Implant Date: 20140908
Lead Channel Pacing Threshold Amplitude: 0.7 V
Lead Channel Pacing Threshold Pulse Width: 0.4 ms
Lead Channel Setting Sensing Sensitivity: 2.5 mV
MDC IDC MSMT LEADCHNL RA IMPEDANCE VALUE: 580 Ohm
MDC IDC MSMT LEADCHNL RV IMPEDANCE VALUE: 836 Ohm
MDC IDC SESS DTM: 20180508085400
MDC IDC SET LEADCHNL RA PACING AMPLITUDE: 2 V
MDC IDC SET LEADCHNL RV PACING AMPLITUDE: 2.5 V
MDC IDC SET LEADCHNL RV PACING PULSEWIDTH: 0.4 ms
MDC IDC STAT BRADY RA PERCENT PACED: 93 %
MDC IDC STAT BRADY RV PERCENT PACED: 22 %
Pulse Gen Serial Number: 160873

## 2016-09-28 ENCOUNTER — Ambulatory Visit: Payer: Medicare Other | Admitting: Internal Medicine

## 2016-10-10 ENCOUNTER — Ambulatory Visit: Payer: Medicare Other | Admitting: Internal Medicine

## 2016-10-10 ENCOUNTER — Encounter: Payer: Self-pay | Admitting: Internal Medicine

## 2016-10-10 ENCOUNTER — Ambulatory Visit (INDEPENDENT_AMBULATORY_CARE_PROVIDER_SITE_OTHER): Payer: Medicare Other | Admitting: Internal Medicine

## 2016-10-10 VITALS — BP 98/40 | HR 70 | Ht 61.0 in | Wt 161.4 lb

## 2016-10-10 DIAGNOSIS — I350 Nonrheumatic aortic (valve) stenosis: Secondary | ICD-10-CM | POA: Insufficient documentation

## 2016-10-10 DIAGNOSIS — I9589 Other hypotension: Secondary | ICD-10-CM

## 2016-10-10 DIAGNOSIS — I959 Hypotension, unspecified: Secondary | ICD-10-CM | POA: Insufficient documentation

## 2016-10-10 DIAGNOSIS — Z953 Presence of xenogenic heart valve: Secondary | ICD-10-CM | POA: Diagnosis not present

## 2016-10-10 HISTORY — DX: Nonrheumatic aortic (valve) stenosis: I35.0

## 2016-10-10 HISTORY — DX: Hypotension, unspecified: I95.9

## 2016-10-10 NOTE — Patient Instructions (Addendum)
Your physician has recommended you make the following change in your medication:  -- STOP amlodipine  Call our office with your blood pressure readings next week.   Your physician recommends that you schedule a follow-up appointment in AUGUST 2018  Your physician has requested that you have an echocardiogram in April 2019. Echocardiography is a painless test that uses sound waves to create images of your heart. It provides your doctor with information about the size and shape of your heart and how well your heart's chambers and valves are working. This procedure takes approximately one hour. There are no restrictions for this procedure.

## 2016-10-10 NOTE — Progress Notes (Signed)
Grossly normal   OFFICE NOTE  Chief Complaint:  Fatigue  Primary Care Physician: Bernerd Limbo, MD  HPI:  Tonya Glass is a pleasant 71 year old female who recently relocated to [place]. She has been living for the past 26 years in [place], where she moved with her husband who is an Training and development officer. 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 For some reason she was being followed by a doctor in [place] who recommended referral to a cardiologist for episodes of dizziness that she was experiencing. Ultimately she saw doctors in [facility] and apparently underwent placement of a pacemaker and at some point had cardiac surgery. She initially reported that she had 2 valves replaced, however subsequently said that she was told that she had a blood vessel that was go "going to burst".  This sounds somewhat like an aneurysm and I wonder if she had a Bentall procedure.  We are still trying to obtain these records. She does have a history of gastric sleeve procedure in 2011 for which she lost weight coming from 330 pounds down to 142 pounds. She also made significant dietary changes. Postoperatively she was quite short of breath and required some recovery and therefore is in [place] living with her children.  Tonya Glass returns today for followup. Since seeing her last, she has been seen by her primary care provider about 3 times for adjustment of her medications. She sees to be having problems with low blood pressure with diastolics in the 68T and 41D. Systolic to been in the 622-297 range. He stopped to her blood pressure medications. Today in the office her blood pressure is elevated at 179/73. She is continuing to take carvedilol 6.25 mg twice daily.  I had the pleasure seeing Tonya Glass back in the office today. Overall she is doing well denies any chest pain or worsening shortness of breath. She's had no issues regarding her pacemaker which is remotely monitor. She  has a an appointment scheduled to see Dr. Loletha Grayer next month. She had repeat echocardiography 2 years ago to assess her valve which is a 23 mm MagnaEase aortic bioprosthetic. She also had concomitant aortic root replacement at the time. Blood pressure appears well controlled on current regimen.  09/16/2015  Tonya Glass returns for follow-up. She was seen last year at which time I made some adjustments on her medications. She was on both ACE and ARB and I discontinued her 65s. I increased her ARB slightly and blood pressures remain stable. Clinically she is asymptomatic, denying any chest pain or worsening shortness of breath. I recommended an echocardiogram at her last office visit but that was never performed. Recently she had lab work through her primary care provider which I was able to view on care everywhere. Cholesterol was well controlled with LDL around 70. Blood pressure is again is at goal. Her pacemaker is being followed by remote checks and she has appointment with Dr. Loletha Grayer in June for further evaluation. EKG today shows an atrial paced rhythm at 75.  08/02/2016  Tonya Glass was referred today for follow-up after being seen by her care provider 2 days ago for acute shortness of breath thought to be systolic congestive heart failure. She reports she recently got back from a trip to [place]. While she was there she thought she had an upper respiratory infection as she was more short of breath but denied any fever or cough or chills. After returning she saw her primary care provider  and he ran tests including a pro-BNP which was elevated greater than 4000. A chest x-ray was performed which did not show any overt pulmonary edema. She was started on Lasix 20 mg daily which is taken for a couple of days and has been urinating frequently. She reports her breathing has improved. She takes his symptoms have come on fairly suddenly, but that she was not concerned because she's been short of breath to some  degree for many years. Weight is decreased from 174-169 over the past couple of days.  08/28/2016  Tonya Glass was seen today in follow-up. She reports marked improvement in her breathing with good diuresis in the 6 pound weight loss. She's also made dietary changes and is working on losing weight. She's decreased her Lasix back to 20 mg every other day after her weight loss as her breathing is improved and lower extremity swelling is improved. We repeated her echocardiogram which unfortunate shows some worsening of her bioprosthetic aortic valve. There is now moderate stenosis with mild to moderate regurgitation. She does have a widened pulse pressure with a diastolic blood pressure in the 50s and sometimes the 40s. We'll need to watch his closely and consider decreasing her blood pressure medication if necessary. Otherwise she feels excellent today.  10/10/2016  Tonya Glass returns today for follow-up. She actually is here for an urgent visit as over the past several weeks she's felt significantly fatigued. When I saw her last month she was doing well and had improved with diuretics. 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 This will need to be reevaluated by echo in 1 year. She subsequently went on her trip out to [facility] and when she returned felt worsening fatigue. Today she is noted to be hypotensive with a blood pressure of 98/40. I reviewed home blood pressure readings which show a trend of decreasing blood pressures mostly in the 90s over 40s. Some systolic blood pressures in the 80s. This is likely the cause of her fatigue.  PMHx:  Past Medical History:  Diagnosis Date  . Anxiety   . Arthritis    "fingers" (02/25/2013)  . Depression   . Gallstones   . Headache(784.0)   . Heart murmur   . High cholesterol   . Hypertension   . Hypothyroidism   . Migraines    "none in years til 01/2013"  (02/25/2013)  . Orthopnoea    "just since open heart surgery" (02/25/2013)  . Pacemaker    [employer]   . Stroke [facility]) 2001   denies residual on 02/25/2013  . Syncope and collapse    "prior to open heart surgery" (02/25/2013)  . Type II diabetes mellitus ([place])   . Walking pneumonia ~ 2012    Past Surgical History:  Procedure Laterality Date  . CARDIAC VALVE REPLACEMENT  01/26/2013   "cow valve put in; widened the second valve" (02/25/2013) - [place]  . INSERT / REPLACE / REMOVE PACEMAKER  01/19/2013   [employer] - dual chamber  . LAPAROSCOPIC GASTRIC SLEEVE RESECTION  2009   in [place]    FAMHx:  Family History  Problem Relation Age of Onset  . Lung cancer Mother   . Heart Problems Father   . Hypertension Brother   . Colon polyps Brother   . Skin cancer Brother   . Colon polyps Daughter   . Diabetes Maternal Aunt   .  Breast cancer Maternal Aunt        mets to liver  . Diabetes Paternal Aunt     SOCHx:   reports that she quit smoking about 17 years ago. Her smoking use included Cigarettes. She has a 80.00 pack-year smoking history. She has never used smokeless tobacco. She reports that she does not drink alcohol or use drugs.  ALLERGIES:  No Known Allergies  ROS: Pertinent items noted in HPI and remainder of comprehensive ROS otherwise negative.  HOME MEDS: Current Outpatient Prescriptions  Medication Sig Dispense Refill  . albuterol (PROVENTIL HFA;VENTOLIN HFA) 108 (90 BASE) MCG/ACT inhaler Inhale 2 puffs into the lungs. Use as directed -  With exercise    . ALPRAZolam (XANAX) 0.25 MG tablet Take 0.25 mg by mouth daily as needed.     Marland Kitchen aspirin 325 MG tablet Take 325 mg by mouth daily.    Marland Kitchen atorvastatin (LIPITOR) 40 MG tablet Take 40 mg by mouth daily.    . carvedilol (COREG) 6.25 MG tablet Take 1 tablet by mouth 2 (two) times daily.    . clonazePAM (KLONOPIN) 1 MG tablet TAKE ONE TABLET BY MOUTH NIGHTLY AS NEEDED FOR  ANXIETY    . FLUoxetine (PROZAC) 20 MG capsule Take 20 mg by mouth daily.    . furosemide (LASIX) 20 MG tablet Take 1 tablet (20 mg total) by mouth daily. 90 tablet 1  . levothyroxine (SYNTHROID, LEVOTHROID) 50 MCG tablet Take 50 mcg by mouth daily before breakfast.    . metFORMIN (GLUCOPHAGE) 500 MG tablet Take 2 tablets by mouth 2 (two) times daily.    . Multiple Vitamins-Minerals (CENTRUM PO) Take 1 tablet by mouth daily.    Marland Kitchen olmesartan (BENICAR) 20 MG tablet TAKE ONE & ONE-HALF TABLETS BY MOUTH ONCE DAILY 135 tablet 3  . Tiotropium Bromide-Olodaterol (STIOLTO RESPIMAT) 2.5-2.5 MCG/ACT AERS Inhale 2 puffs into the lungs daily.    . traMADol (ULTRAM) 50 MG tablet Take 50 mg by mouth as needed.     Current Facility-Administered Medications  Medication Dose Route Frequency Provider Last Rate Last Dose  . 0.9 %  sodium chloride infusion  500 mL Intravenous Continuous Armbruster, Renelda Loma, MD        LABS/IMAGING: No results found for this or any previous visit (from the past 48 hour(s)). No results found.  VITALS: BP (!) 98/40   Pulse 70   Ht 5\' 1"  (1.549 m)   Wt 161 lb 6.4 oz (73.2 kg)   BMI 30.50 kg/m   EXAM: General appearance: alert and no distress Neck: no carotid bruit and no JVD Lungs: clear to auscultation bilaterally Heart: regular rate and rhythm, S1, S2 normal and systolic murmur: Midsystolic 3/6, crescendo at 2nd right intercostal space Abdomen: soft, non-tender; bowel sounds normal; no masses,  no organomegaly Extremities: extremities normal, atraumatic, no cyanosis or edema Pulses: 2+ and symmetric Skin: Skin color, texture, turgor normal. No rashes or lesions Neurologic: Grossly normal Psych: Normal  EKG: Deferred  ASSESSMENT: 1. Symptomatic hypotension 2. Acute congestive heart failure - possibly related to acute aortic insufficiency 3. S/p [employer] pacemaker - complete heart block 4. ? Paroxysmal atrial flutter - no high rate episodes noted on  her pacemaker  5. 01/26/13 - Aortic valve replacement with 23 mm Perimount Magna  Ease bovine pericardial bioprosthesis by [facility] and ascending aortic  replacement with 28 mm Terumo interposition graft ([facility]) 6. HTN - labile 7. Hyperlipidemia 8. DM2 9. 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 Labile hypertension 11. CAD - 40% mid-LAD stenosis  PLAN: 1.   Tonya Glass has developed symptomatic hypotension with blood pressures in the 77N to 16F systolic over 79U. She is currently on amlodipine, olmesartan and and carvedilol. I advised her to discontinue amlodipine 5 mg daily. She'll monitor blood pressures at home and contact us next week to see if her blood pressures are improved and if her symptoms have resolved. We have a regular scheduled appointment in August and she will have a follow-up echocardiogram in April 2019.  Pixie Casino, MD, [facility] Attending Cardiologist [place] 10/10/2016, 1:42 PM

## 2016-10-17 ENCOUNTER — Telehealth: Payer: Self-pay | Admitting: Internal Medicine

## 2016-10-17 NOTE — Telephone Encounter (Signed)
Pt notified she will call back in 7-10 days with BP readings pt notified to call ASAP if any new sx develop

## 2016-10-17 NOTE — Telephone Encounter (Signed)
Spoke with pt she states that she has stopped her amlodipine 10-11-16 (her last OV) and her BP is still running low, she states that she was supposed to call if BP was still low. States that it has been running 97-115/47-79. She is taking all her medications as ordered. Is it ok to take Benicar 20 mg daily (1 tab) she is currently taking 1-1/2 tablets. Would this help? Please advise

## 2016-10-17 NOTE — Telephone Encounter (Signed)
New message    Since the change in bp medication her bp is fluctuating and she would like to speak to you  Tonya Glass was better than the day before, but its still not great

## 2016-10-17 NOTE — Telephone Encounter (Signed)
Thank you :)

## 2016-10-17 NOTE — Telephone Encounter (Signed)
Yes, cut olmesartan to 1 tablet daily and continue with home BP monitoring.  Call in 7-10 days if BP still low.

## 2016-10-26 ENCOUNTER — Telehealth: Payer: Self-pay | Admitting: Internal Medicine

## 2016-10-26 NOTE — Telephone Encounter (Signed)
I would stop benicar to see if symptoms resolve.  Thanks.  Dr. Lemmie Evens

## 2016-10-26 NOTE — Telephone Encounter (Signed)
Returned call to patient.Dr.Hilty's advice given.Advised to continue to monitor B/P and call back if needed.

## 2016-10-26 NOTE — Telephone Encounter (Signed)
Returned call to patient.She stated she decreased Benicar to 20 mg daily 10 days ago was told to call back with B/P readings.Stated she feels ok except she gets light headed in the evenings.Stated she did not take Benicar this morning.Advised I will send message to Dr.Hilty for advice.

## 2016-10-26 NOTE — Telephone Encounter (Signed)
New message   Pt told to call and give her readings prior to taking her blood pressure pill    Pt c/o BP issue: STAT if pt c/o blurred vision, one-sided weakness or slurred speech  1. What are your last 5 BP readings? 116/78, 111/75, 109/70, 92/81, 91/79 Pt states pulse is 73 or 74.  2. Are you having any other symptoms (ex. Dizziness, headache, blurred vision, passed out)? Lightheaded and on and off headaches  3. What is your BP issue? Pt was instructed to call - pt states she is going to wait utill she hears from [place] to take her pill.

## 2016-11-08 ENCOUNTER — Telehealth: Payer: Self-pay | Admitting: Internal Medicine

## 2016-11-08 NOTE — Telephone Encounter (Signed)
Returned call. Pt stopped Benicar on 6/15 at our instruction d/t concerns for her BP running low. She had also been symptomatic w dizziness, headaches, and lightheadedness.  Pt voices that since d/c'ing Benicar, her BPs have been improved. Now typically when checked, her BP is between 763-943 systolic, she had one outlier reading last night of 94/46 but notes her prior symptoms have resolved and she did not have any symptoms w/ the recent low BP reading.  Notes also that typically when she gets up in the morning, she has "a little anxiety and some palpitations, but then it goes away right away". She voices no concern for this, but she felt Dr. Debara Pickett should be aware. Informed her I would relay this information as well. Pt voiced thanks for call.  >> Sent to MD for recommendations.

## 2016-11-08 NOTE — Telephone Encounter (Signed)
New message    Pt is calling to report her BP.  115/59 p-72 94/46 p-71 129/68 p-75  Benicar is what she stopped.

## 2016-11-10 NOTE — Telephone Encounter (Signed)
Sounds like BP is better. No changes.  Dr. Lemmie Evens

## 2016-11-13 NOTE — Telephone Encounter (Signed)
Called patient back, she voiced palps resolved. No further issues, BPs stable.  Pt aware of recommendations and to call if new concerns.

## 2016-12-18 ENCOUNTER — Ambulatory Visit (INDEPENDENT_AMBULATORY_CARE_PROVIDER_SITE_OTHER): Payer: Medicare Other | Admitting: *Deleted

## 2016-12-18 DIAGNOSIS — I495 Sick sinus syndrome: Secondary | ICD-10-CM

## 2016-12-19 ENCOUNTER — Encounter: Payer: Self-pay | Admitting: Cardiology

## 2016-12-19 NOTE — Progress Notes (Signed)
Remote pacemaker transmission.   

## 2016-12-25 LAB — CUP PACEART REMOTE DEVICE CHECK
Battery Remaining Percentage: 100 %
Brady Statistic RA Percent Paced: 93 %
Brady Statistic RV Percent Paced: 22 %
Lead Channel Pacing Threshold Pulse Width: 0.4 ms
Lead Channel Setting Pacing Amplitude: 2 V
Lead Channel Setting Pacing Amplitude: 2.5 V
Lead Channel Setting Pacing Pulse Width: 0.4 ms
MDC IDC MSMT BATTERY REMAINING LONGEVITY: 66 mo
MDC IDC MSMT LEADCHNL RA IMPEDANCE VALUE: 636 Ohm
MDC IDC MSMT LEADCHNL RA PACING THRESHOLD AMPLITUDE: 0.7 V
MDC IDC MSMT LEADCHNL RV IMPEDANCE VALUE: 841 Ohm
MDC IDC PG IMPLANT DT: 20140908
MDC IDC PG SERIAL: 160873
MDC IDC SESS DTM: 20180807084100
MDC IDC SET LEADCHNL RV SENSING SENSITIVITY: 2.5 mV

## 2017-01-04 ENCOUNTER — Ambulatory Visit (INDEPENDENT_AMBULATORY_CARE_PROVIDER_SITE_OTHER): Payer: Medicare Other | Admitting: Internal Medicine

## 2017-01-04 ENCOUNTER — Encounter: Payer: Self-pay | Admitting: Internal Medicine

## 2017-01-04 VITALS — BP 150/54 | HR 71 | Ht 61.0 in | Wt 162.0 lb

## 2017-01-04 DIAGNOSIS — Z950 Presence of cardiac pacemaker: Secondary | ICD-10-CM

## 2017-01-04 DIAGNOSIS — I38 Endocarditis, valve unspecified: Secondary | ICD-10-CM

## 2017-01-04 DIAGNOSIS — Z953 Presence of xenogenic heart valve: Secondary | ICD-10-CM

## 2017-01-04 DIAGNOSIS — I10 Essential (primary) hypertension: Secondary | ICD-10-CM

## 2017-01-04 DIAGNOSIS — I350 Nonrheumatic aortic (valve) stenosis: Secondary | ICD-10-CM

## 2017-01-04 NOTE — Patient Instructions (Signed)
Your physician wants you to follow-up in: MAY 2019 with Dr. Debara Pickett. You will receive a reminder letter in the mail two months in advance. If you don't receive a letter, please call our office to schedule the follow-up appointment.

## 2017-01-04 NOTE — Progress Notes (Signed)
Grossly normal   OFFICE NOTE  Chief Complaint:  No complaints  Primary Care Physician: Bernerd Limbo, MD  HPI:  Tonya Glass is a pleasant 71 year old female who recently relocated to [place]. She has been living for the past 26 years in [place], where she moved with her husband who is an Training and development officer. 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 For some reason she was being followed by a doctor in [place] who recommended referral to a cardiologist for episodes of dizziness that she was experiencing. Ultimately she saw doctors in [facility] and apparently underwent placement of a pacemaker and at some point had cardiac surgery. She initially reported that she had 2 valves replaced, however subsequently said that she was told that she had a blood vessel that was go "going to burst".  This sounds somewhat like an aneurysm and I wonder if she had a Bentall procedure.  We are still trying to obtain these records. She does have a history of gastric sleeve procedure in 2011 for which she lost weight coming from 330 pounds down to 142 pounds. She also made significant dietary changes. Postoperatively she was quite short of breath and required some recovery and therefore is in [place] living with her children.  Tonya Glass returns today for followup. Since seeing her last, she has been seen by her primary care provider about 3 times for adjustment of her medications. She sees to be having problems with low blood pressure with diastolics in the 35T and 73U. Systolic to been in the 202-542 range. He stopped to her blood pressure medications. Today in the office her blood pressure is elevated at 179/73. She is continuing to take carvedilol 6.25 mg twice daily.  I had the pleasure seeing Tonya Glass back in the office today. Overall she is doing well denies any chest pain or worsening shortness of breath. She's had no issues regarding her pacemaker which is remotely  monitor. She has a an appointment scheduled to see Dr. Loletha Grayer next month. She had repeat echocardiography 2 years ago to assess her valve which is a 23 mm MagnaEase aortic bioprosthetic. She also had concomitant aortic root replacement at the time. Blood pressure appears well controlled on current regimen.  09/16/2015  Tonya Glass returns for follow-up. She was seen last year at which time I made some adjustments on her medications. She was on both ACE and ARB and I discontinued her 93s. I increased her ARB slightly and blood pressures remain stable. Clinically she is asymptomatic, denying any chest pain or worsening shortness of breath. I recommended an echocardiogram at her last office visit but that was never performed. Recently she had lab work through her primary care provider which I was able to view on care everywhere. Cholesterol was well controlled with LDL around 70. Blood pressure is again is at goal. Her pacemaker is being followed by remote checks and she has appointment with Dr. Loletha Grayer in June for further evaluation. EKG today shows an atrial paced rhythm at 75.  08/02/2016  Tonya Glass was referred today for follow-up after being seen by her care provider 2 days ago for acute shortness of breath thought to be systolic congestive heart failure. She reports she recently got back from a trip to [place]. While she was there she thought she had an upper respiratory infection as she was more short of breath but denied any fever or cough or chills. After returning she saw her primary care  provider and he ran tests including a pro-BNP which was elevated greater than 4000. A chest x-ray was performed which did not show any overt pulmonary edema. She was started on Lasix 20 mg daily which is taken for a couple of days and has been urinating frequently. She reports her breathing has improved. She takes his symptoms have come on fairly suddenly, but that she was not concerned because she's been short of  breath to some degree for many years. Weight is decreased from 174-169 over the past couple of days.  08/28/2016  Tonya Glass was seen today in follow-up. She reports marked improvement in her breathing with good diuresis in the 6 pound weight loss. She's also made dietary changes and is working on losing weight. She's decreased her Lasix back to 20 mg every other day after her weight loss as her breathing is improved and lower extremity swelling is improved. We repeated her echocardiogram which unfortunate shows some worsening of her bioprosthetic aortic valve. There is now moderate stenosis with mild to moderate regurgitation. She does have a widened pulse pressure with a diastolic blood pressure in the 50s and sometimes the 40s. We'll need to watch his closely and consider decreasing her blood pressure medication if necessary. Otherwise she feels excellent today.  10/10/2016  Tonya Glass returns today for follow-up. She actually is here for an urgent visit as over the past several weeks she's felt significantly fatigued. When I saw her last month she was doing well and had improved with diuretics. 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 This will need to be reevaluated by echo in 1 year. She subsequently went on her trip out to [facility] and when she returned felt worsening fatigue. Today she is noted to be hypotensive with a blood pressure of 98/40. I reviewed home blood pressure readings which show a trend of decreasing blood pressures mostly in the 90s over 40s. Some systolic blood pressures in the 80s. This is likely the cause of her fatigue.  01/04/2017  Tonya Glass was seen today in follow-up. She had been feeling some fatigue and dizziness. Blood pressures were somewhat low, particularly her diastolic blood pressures in the 40s. Her last echo showed mild to moderate aortic regurgitation and mild to  moderate stenosis which was slightly worse. This is suggesting that the valve may be deteriorating somewhat. Because of her aortic regurgitation there is a wide pulse pressure. Her pulse pressure is close to 100 mmHg. This makes it challenging to aggressively manage her systolic blood pressure without diastolic hypotension. I recommended discontinuing her amlodipine and she reports an improvement in her symptoms with diastolic blood pressures now in the 32D, but her systolic pressures have generally range from the 140s to 150s.  PMHx:  Past Medical History:  Diagnosis Date  . Anxiety   . Arthritis    "fingers" (02/25/2013)  . Depression   . Gallstones   . Headache(784.0)   . Heart murmur   . High cholesterol   . Hypertension   . Hypothyroidism   . Migraines    "none in years til 01/2013" (02/25/2013)  . Orthopnoea    "just since open heart surgery" (02/25/2013)  . Pacemaker    [employer]   . Stroke [facility]) 2001   denies residual on 02/25/2013  . Syncope and collapse    "prior to open heart surgery" (02/25/2013)  . Type II diabetes mellitus ([place])   .  Walking pneumonia ~ 2012    Past Surgical History:  Procedure Laterality Date  . CARDIAC VALVE REPLACEMENT  01/26/2013   "cow valve put in; widened the second valve" (02/25/2013) - [place], [place]  . INSERT / REPLACE / REMOVE PACEMAKER  01/19/2013   [employer] - dual chamber  . LAPAROSCOPIC GASTRIC SLEEVE RESECTION  2009   in [place]    FAMHx:  Family History  Problem Relation Age of Onset  . Lung cancer Mother   . Heart Problems Father   . Hypertension Brother   . Colon polyps Brother   . Skin cancer Brother   . Colon polyps Daughter   . Diabetes Maternal Aunt   . Breast cancer Maternal Aunt        mets to liver  . Diabetes Paternal Aunt     SOCHx:   reports that she quit smoking about 17 years ago. Her smoking use included Cigarettes. She has a 80.00 pack-year smoking history. She has  never used smokeless tobacco. She reports that she does not drink alcohol or use drugs.  ALLERGIES:  No Known Allergies  ROS: Pertinent items noted in HPI and remainder of comprehensive ROS otherwise negative.  HOME MEDS: Current Outpatient Prescriptions  Medication Sig Dispense Refill  . albuterol (PROVENTIL HFA;VENTOLIN HFA) 108 (90 BASE) MCG/ACT inhaler Inhale 2 puffs into the lungs. Use as directed -  With exercise    . ALPRAZolam (XANAX) 0.25 MG tablet Take 0.25 mg by mouth daily as needed.     Marland Kitchen aspirin 325 MG tablet Take 325 mg by mouth daily.    Marland Kitchen atorvastatin (LIPITOR) 40 MG tablet Take 40 mg by mouth daily.    Marland Kitchen buPROPion (WELLBUTRIN XL) 150 MG 24 hr tablet Take 150 mg by mouth daily.    . carvedilol (COREG) 6.25 MG tablet Take 1 tablet by mouth 2 (two) times daily.    . clonazePAM (KLONOPIN) 1 MG tablet TAKE ONE TABLET BY MOUTH NIGHTLY AS NEEDED FOR ANXIETY    . FLUoxetine (PROZAC) 20 MG capsule Take 20 mg by mouth daily.    . furosemide (LASIX) 20 MG tablet Take 20 mg every other day 30 tablet 6  . levothyroxine (SYNTHROID, LEVOTHROID) 50 MCG tablet Take 50 mcg by mouth daily before breakfast.    . metFORMIN (GLUCOPHAGE) 500 MG tablet Take 2 tablets by mouth 2 (two) times daily.    . Multiple Vitamins-Minerals (CENTRUM PO) Take 1 tablet by mouth daily.    . Tiotropium Bromide-Olodaterol (STIOLTO RESPIMAT) 2.5-2.5 MCG/ACT AERS Inhale 2 puffs into the lungs daily.    . traMADol (ULTRAM) 50 MG tablet Take 50 mg by mouth as needed.     Current Facility-Administered Medications  Medication Dose Route Frequency Provider Last Rate Last Dose  . 0.9 %  sodium chloride infusion  500 mL Intravenous Continuous Armbruster, Renelda Loma, MD        LABS/IMAGING: No results found for this or any previous visit (from the past 48 hour(s)). No results found.  VITALS: BP (!) 150/54   Pulse 71   Ht 5\' 1"  (1.549 m)   Wt 162 lb (73.5 kg)   BMI 30.61 kg/m   EXAM: General appearance:  alert and no distress Neck: no carotid bruit and no JVD Lungs: clear to auscultation bilaterally Heart: regular rate and rhythm, S1, S2 normal, systolic murmur: Midsystolic 3/6, crescendo at 2nd right intercostal space and diastolic murmur: early diastolic 2/6, blowing at lower left  sternal border Abdomen: soft, non-tender; bowel sounds normal; no masses,  no organomegaly Extremities: extremities normal, atraumatic, no cyanosis or edema Pulses: 2+ and symmetric Skin: Skin color, texture, turgor normal. No rashes or lesions Neurologic: Grossly normal Psych: Normal  EKG: Atrial paced rhythm with RBBB at 71-personally reviewed  ASSESSMENT: 1. Symptomatic hypotension 2. Acute congestive heart failure - possibly related to acute aortic insufficiency 3. S/p [employer] pacemaker - complete heart block 4. ? Paroxysmal atrial flutter - no high rate episodes noted on her pacemaker  5. 01/26/13 - Aortic valve replacement with 23 mm Perimount Magna  Ease bovine pericardial bioprosthesis by [place] and ascending aortic  replacement with 28 mm Terumo interposition graft ([facility]) 6. HTN - labile 7. Hyperlipidemia 8. DM2 9. 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 Labile hypertension 11. CAD - 40% mid-LAD stenosis  PLAN: 1.   Tonya Glass reports improvement in her symptoms after discontinuing amlodipine. She has a wide pulse pressure likely related to moderate aortic insufficiency. 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 Unfortunately these are signs that her aortic valve is worsening. Her surgery was in 2014. This would represent early valve dysfunction and I'm concerned that she may need a repeat valve surgery in the near future. She is scheduled for another echocardiogram in April 2016. She has a pacemaker check with Dr. Loletha Grayer in November. Plan follow-up with me after her echo next May or sooner as needed if symptoms dictate.  Pixie Casino, MD, [facility] Attending  Cardiologist [place] 01/04/2017, 12:29 PM

## 2017-03-06 ENCOUNTER — Telehealth: Payer: Self-pay | Admitting: Internal Medicine

## 2017-03-06 NOTE — Telephone Encounter (Signed)
   Chart reviewed as part of pre-operative protocol coverage. Given past medical history and time since last visit, based on ACC/AHA guidelines, Darcy Barbara would be at acceptable risk for the planned procedure without further cardiovascular testing. Low risk cataract surgery w/ Topical anesthesia. Can be cleared w/o need for cardiac w/u. Please notify patient and Dr. Talbert Forest at [facility].   Lyda Jester, PA-C 03/06/2017, 2:48 PM

## 2017-03-06 NOTE — Telephone Encounter (Signed)
Left message to inform patient that she is cleared for eye cataract extraction. I am agreeable to faxing clearance to [facility].

## 2017-03-06 NOTE — Telephone Encounter (Signed)
   [facility] Pre-operative Risk Assessment    Request for surgical clearance:  1. What type of surgery is being performed? Cataract extraction with intraocular lens implantation of left eye followed by right eye   2. When is this surgery scheduled? Dec 2018   3. Are there any medications that need to be held prior to surgery and how long? none   4. Practice name and name of physician performing surgery? Dr. Modesto Charon @ [facility]   5. What is your office phone and fax number? (p) [phone]  ([phone] (attnSharyn Lull, surgical coordinator)  6. Anesthesia type (None, local, MAC, general) ? Topical anesthesia w/IV medication   Tonya Glass 03/06/2017, 9:24 AM  _________________________________________________________________   (provider comments below)

## 2017-03-17 ENCOUNTER — Other Ambulatory Visit: Payer: Self-pay | Admitting: Internal Medicine

## 2017-03-19 ENCOUNTER — Ambulatory Visit (INDEPENDENT_AMBULATORY_CARE_PROVIDER_SITE_OTHER): Payer: Medicare Other | Admitting: *Deleted

## 2017-03-19 DIAGNOSIS — I495 Sick sinus syndrome: Secondary | ICD-10-CM

## 2017-03-19 NOTE — Progress Notes (Signed)
Remote pacemaker transmission.   

## 2017-03-21 ENCOUNTER — Encounter: Payer: Self-pay | Admitting: Cardiology

## 2017-03-22 ENCOUNTER — Encounter: Payer: Self-pay | Admitting: Cardiovascular Disease

## 2017-03-22 ENCOUNTER — Ambulatory Visit (INDEPENDENT_AMBULATORY_CARE_PROVIDER_SITE_OTHER): Payer: Medicare Other | Admitting: Cardiovascular Disease

## 2017-03-22 VITALS — BP 159/73 | HR 71 | Ht 61.0 in | Wt 171.0 lb

## 2017-03-22 DIAGNOSIS — Z953 Presence of xenogenic heart valve: Secondary | ICD-10-CM | POA: Diagnosis not present

## 2017-03-22 DIAGNOSIS — I10 Essential (primary) hypertension: Secondary | ICD-10-CM

## 2017-03-22 DIAGNOSIS — Z950 Presence of cardiac pacemaker: Secondary | ICD-10-CM | POA: Diagnosis not present

## 2017-03-22 DIAGNOSIS — I495 Sick sinus syndrome: Secondary | ICD-10-CM | POA: Diagnosis not present

## 2017-03-22 MED ORDER — CARVEDILOL 12.5 MG PO TABS: 12.5000 mg | ORAL_TABLET | Freq: Two times a day (BID) | ORAL | 3 refills | Status: DC

## 2017-03-22 NOTE — Patient Instructions (Signed)
Dr Sallyanne Kuster has recommended making the following medication changes: 1. INCREASE Carvedilol to 12.5 mg twice daily  Remote monitoring is used to monitor your Pacemaker or ICD from home. This monitoring reduces the number of office visits required to check your device to one time per year. It allows [place] to keep an eye on the functioning of your device to ensure it is working properly. You are scheduled for a device check from home on Tuesday, February 5th, 2018. You may send your transmission at any time that day. If you have a wireless device, the transmission will be sent automatically. After your physician reviews your transmission, you will receive a notification with your next transmission date.  Dr Sallyanne Kuster recommends that you schedule a follow-up appointment in 12 months with a pacemaker check. You will receive a reminder letter in the mail two months in advance. If you don't receive a letter, please call our office to schedule the follow-up appointment.  If you need a refill on your cardiac medications before your next appointment, please call your pharmacy.

## 2017-03-22 NOTE — Progress Notes (Signed)
Cardiology Office Note    Date:  03/22/2017   ID:  Tonya Glass, DOB Apr 19, 1946, MRN [id number]  PCP:  Bartholome Bill, MD  Cardiologist:  Kathy Breach, M.D.; Sanda Klein, MD   Chief complaint: Pacemaker follow-up    History of Present Illness:  Tonya Glass is a 71 y.o. 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   She is feeling great.  She is planning a trip to [place] for a couple of weeks.  She denies problems with exertional dyspnea or angina, palpitations, leg edema, dizziness or syncope, focal neurological complaints.  She has noticed that her systolic blood pressures typically in the 140s-150s, despite compliance with medications.  She was on Benicar in the past but this was stopped due to dizziness.  She currently takes only carvedilol and a low-dose of furosemide on an every other day basis.  Although she is socially engaged she is physically rather sedentary.  Back problems limit her activity level.  Interrogation of her pacemaker shows normal device function with an estimated remaining longevity of 5 years, 92% atrial pacing, 21% ventricular pacing, steady activity level at roughly 1 hour/day.  Past Medical History:  Diagnosis Date  . Anxiety   . Arthritis    "fingers" (02/25/2013)  . Depression   . Gallstones   . Headache(784.0)   . Heart murmur   . High cholesterol   . Hypertension   . Hypothyroidism   . Migraines    "none in years til 01/2013" (02/25/2013)  . Orthopnoea    "just since open heart surgery" (02/25/2013)  . Pacemaker    [employer]   . Stroke [facility]) 2001   denies residual on 02/25/2013  . Syncope and collapse    "prior to open heart surgery" (02/25/2013)  . Type II diabetes  mellitus ([place])   . Walking pneumonia ~ 2012    Past Surgical History:  Procedure Laterality Date  . CARDIAC VALVE REPLACEMENT  01/26/2013   "cow valve put in; widened the second valve" (02/25/2013) - [place], [place]  . INSERT / REPLACE / REMOVE PACEMAKER  01/19/2013   [employer] - dual chamber  . [place] RESECTION  2009   in [place]    Current Medications: Outpatient Medications Prior to Visit  Medication Sig Dispense Refill  . albuterol (PROVENTIL HFA;VENTOLIN HFA) 108 (90 BASE) MCG/ACT inhaler Inhale 2 puffs into the lungs. Use as directed -  With exercise    . ALPRAZolam (XANAX) 0.25 MG tablet Take 0.25 mg by mouth daily as needed.     Marland Kitchen aspirin 325 MG tablet Take 325 mg by mouth daily.    Marland Kitchen atorvastatin (LIPITOR) 40 MG tablet Take 40 mg by mouth daily.    Marland Kitchen buPROPion (WELLBUTRIN XL) 150 MG 24 hr tablet Take 150 mg by mouth daily.    . clonazePAM (KLONOPIN) 1 MG tablet TAKE ONE TABLET BY MOUTH NIGHTLY AS NEEDED FOR ANXIETY    . FLUoxetine (PROZAC) 20 MG capsule Take 20 mg by mouth daily.    . furosemide (LASIX) 20 MG tablet Take 20 mg every other day 30 tablet 6  . levothyroxine (SYNTHROID, LEVOTHROID) 50 MCG tablet Take 50 mcg by mouth daily before breakfast.    . metFORMIN (GLUCOPHAGE) 500 MG tablet  Take 2 tablets by mouth 2 (two) times daily.    . Tiotropium Bromide-Olodaterol (STIOLTO RESPIMAT) 2.5-2.5 MCG/ACT AERS Inhale 2 puffs into the lungs daily.    . traMADol (ULTRAM) 50 MG tablet Take 50 mg by mouth as needed.    . carvedilol (COREG) 6.25 MG tablet Take 1 tablet by mouth 2 (two) times daily.    . furosemide (LASIX) 20 MG tablet TAKE 1 TABLET BY MOUTH ONCE DAILY (Patient not taking: Reported on 03/22/2017) 90 tablet 1  . Multiple Vitamins-Minerals (CENTRUM PO) Take 1 tablet by mouth daily.     Facility-Administered Medications Prior to Visit  Medication Dose Route Frequency Provider Last Rate Last Dose  . 0.9 %  sodium  chloride infusion  500 mL Intravenous Continuous Armbruster, Carlota Raspberry, MD         Allergies:   Patient has no known allergies.   Social History   Socioeconomic History  . Marital status: Single    Spouse name: None  . Number of children: 2  . Years of education: dental sch  . Highest education level: None  Social Needs  . Financial resource strain: None  . Food insecurity - worry: None  . Food insecurity - inability: None  . Transportation needs - medical: None  . Transportation needs - non-medical: None  Occupational History  . Occupation: retired  Tobacco Use  . Smoking status: Former Smoker    Packs/day: 2.00    Years: 40.00    Pack years: 80.00    Types: Cigarettes    Last attempt to quit: 05/29/1999    Years since quitting: 17.8  . Smokeless tobacco: Never Used  . Tobacco comment: 02/25/2013 "quit smoking in 2001"  Substance and Sexual Activity  . Alcohol use: No  . Drug use: No  . Sexual activity: No  Other Topics Concern  . None  Social History Narrative  . None     Family History:  The patient's family history includes Breast cancer in her maternal aunt; Colon polyps in her brother and daughter; Diabetes in her maternal aunt and paternal aunt; Heart Problems in her father; Hypertension in her brother; Lung cancer in her mother; Skin cancer in her brother.   ROS:   Please see the history of present illness.    ROS All other systems reviewed and are negative.   PHYSICAL EXAM:   VS:  BP (!) 159/73   Pulse 71   Ht 5\' 1"  (1.549 m)   Wt 171 lb (77.6 kg)   SpO2 95%   BMI 32.31 kg/m     General: Alert, oriented x3, no distress, mildly obese Head: no evidence of trauma, PERRL, EOMI, no exophtalmos or lid lag, no myxedema, no xanthelasma; normal ears, nose and oropharynx Neck: normal jugular venous pulsations and no hepatojugular reflux; brisk carotid pulses without delay and no carotid bruits Chest: clear to auscultation, no signs of consolidation by  percussion or palpation, normal fremitus, symmetrical and full respiratory excursions Cardiovascular: normal position and quality of the apical impulse, regular rhythm, normal first and widely split second heart sounds, no rubs or gallops, 2-3/6 decrescendo diastolic murmur of aortic insufficiency and 2/6 systolic early peaking ejection murmur healthy left subclavian pacemaker site,  Abdomen: no tenderness or distention, no masses by palpation, no abnormal pulsatility or arterial bruits, normal bowel sounds, no hepatosplenomegaly Extremities: no clubbing, cyanosis or edema; 2+ radial, ulnar and brachial pulses bilaterally; 2+ right femoral, posterior tibial and dorsalis pedis pulses; 2+ left femoral, posterior  tibial and dorsalis pedis pulses; no subclavian or femoral bruits Neurological: grossly nonfocal Psych: Normal mood and affect   Wt Readings from Last 3 Encounters:  03/22/17 171 lb (77.6 kg)  01/04/17 162 lb (73.5 kg)  10/10/16 161 lb 6.4 oz (73.2 kg)      Studies/Labs Reviewed:   EKG:  EKG is not ordered today.  ECG on August 24 shows atrial paced, ventricular sensed rhythm with right bundle branch block  ASSESSMENT:    1. Sick sinus syndrome ([place])   2. Pacemaker   3. S/P aortic valve replacement with bioprosthetic valve   4. Essential hypertension      PLAN:  In order of problems listed above:  1. SSS: Her pacemaker was initially implanted for AV block, but this has recovered and she now requires pacing for persistent sinus bradycardia.  She has no complaints of chronotropic incompetence and remains quite sedentary. 2. PPM: Normal pacemaker function.  Remote downloads every 3 months and yearly office visit. 3. S/p AVR:  Despite a very loud diastolic murmur she only has mild aortic insufficiency by echo performed last year.  She has no clinical signs or symptoms of aortic insufficiency. 4. HTN: she reports that her blood pressure has been consistently elevated.  We will  increase her carvedilol to 12.5 mg twice daily.  You   Medication Adjustments/Labs and Tests Ordered: Current medicines are reviewed at length with the patient today.  Concerns regarding medicines are outlined above.  Medication changes, Labs and Tests ordered today are listed in the Patient Instructions below. Patient Instructions  Dr Sallyanne Kuster has recommended making the following medication changes: 1. INCREASE Carvedilol to 12.5 mg twice daily  Remote monitoring is used to monitor your Pacemaker or ICD from home. This monitoring reduces the number of office visits required to check your device to one time per year. It allows [place] to keep an eye on the functioning of your device to ensure it is working properly. You are scheduled for a device check from home on Tuesday, February 5th, 2018. You may send your transmission at any time that day. If you have a wireless device, the transmission will be sent automatically. After your physician reviews your transmission, you will receive a notification with your next transmission date.  Dr Sallyanne Kuster recommends that you schedule a follow-up appointment in 12 months with a pacemaker check. You will receive a reminder letter in the mail two months in advance. If you don't receive a letter, please call our office to schedule the follow-up appointment.  If you need a refill on your cardiac medications before your next appointment, please call your pharmacy.    Signed, Sanda Klein, MD  03/22/2017 2:46 PM    [address], [place], [place]  [address] Phone: [phone]; Fax: [phone]

## 2017-03-29 LAB — CUP PACEART REMOTE DEVICE CHECK
Brady Statistic RA Percent Paced: 92 %
Brady Statistic RV Percent Paced: 21 %
Date Time Interrogation Session: 20181106100500
Implantable Pulse Generator Implant Date: 20140908
Lead Channel Impedance Value: 632 Ohm
Lead Channel Pacing Threshold Amplitude: 0.7 V
Lead Channel Setting Pacing Amplitude: 2 V
Lead Channel Setting Pacing Amplitude: 2.5 V
Lead Channel Setting Pacing Pulse Width: 0.4 ms
MDC IDC MSMT BATTERY REMAINING LONGEVITY: 60 mo
MDC IDC MSMT BATTERY REMAINING PERCENTAGE: 96 %
MDC IDC MSMT LEADCHNL RA PACING THRESHOLD PULSEWIDTH: 0.4 ms
MDC IDC MSMT LEADCHNL RV IMPEDANCE VALUE: 805 Ohm
MDC IDC SET LEADCHNL RV SENSING SENSITIVITY: 2.5 mV
Pulse Gen Serial Number: 160873

## 2017-04-11 LAB — CUP PACEART INCLINIC DEVICE CHECK
Battery Remaining Percentage: 96 %
Brady Statistic RA Percent Paced: 92 %
Date Time Interrogation Session: 20181109135200
Implantable Pulse Generator Implant Date: 20140908
Lead Channel Impedance Value: 595 Ohm
Lead Channel Impedance Value: 746 Ohm
Lead Channel Setting Pacing Amplitude: 2 V
Lead Channel Setting Pacing Amplitude: 2.5 V
Lead Channel Setting Sensing Sensitivity: 2.5 mV
MDC IDC MSMT BATTERY REMAINING LONGEVITY: 60 mo
MDC IDC MSMT LEADCHNL RA PACING THRESHOLD AMPLITUDE: 0.7 V
MDC IDC MSMT LEADCHNL RA PACING THRESHOLD PULSEWIDTH: 0.4 ms
MDC IDC SET LEADCHNL RV PACING PULSEWIDTH: 0.4 ms
MDC IDC STAT BRADY RV PERCENT PACED: 21 %
Pulse Gen Serial Number: 160873

## 2017-04-13 HISTORY — PX: CATARACT EXTRACTION W/ INTRAOCULAR LENS  IMPLANT, BILATERAL: SHX1307

## 2017-04-19 ENCOUNTER — Other Ambulatory Visit: Payer: Self-pay | Admitting: Cardiovascular Disease

## 2017-05-01 ENCOUNTER — Telehealth: Payer: Self-pay | Admitting: Internal Medicine

## 2017-05-01 NOTE — Telephone Encounter (Signed)
°  New Prob  Pt c/o BP issue: STAT if pt c/o blurred vision, one-sided weakness or slurred speech  1. What are your last 5 BP readings? 148/64 (this afternoon); 158/65 (this morning); 122/56 (yesterday)  2. Are you having any other symptoms (ex. Dizziness, headache, blurred vision, passed out)? No  3. What is your BP issue? Concerned her BP continues to increase

## 2017-05-01 NOTE — Telephone Encounter (Signed)
Since yesterday BP was normal, I would wait for another couple of days before we change meds. Those values are only mildly high, no need to rush to change if she does not feel bad.

## 2017-05-01 NOTE — Telephone Encounter (Signed)
Returned call to patient she stated B/P has been elevated ranging 076 to 151 systolic  64 to 71 diastolic did not check pulse.She wanted to know if she needed to increase medication.Message sent to Dr.Croitoru for advice.

## 2017-05-02 NOTE — Telephone Encounter (Signed)
Returning your call from today. °

## 2017-05-02 NOTE — Telephone Encounter (Signed)
Returned call to patient Dr.Croitoru's recommendations given.Advised to continue to monitor B/P and call back if elevated.

## 2017-05-02 NOTE — Telephone Encounter (Signed)
Returned call to patient no answer.LMTC. 

## 2017-05-24 ENCOUNTER — Telehealth: Payer: Self-pay | Admitting: Internal Medicine

## 2017-05-24 MED ORDER — OLMESARTAN MEDOXOMIL 20 MG PO TABS: 10.0000 mg | ORAL_TABLET | Freq: Every day | ORAL | Status: DC

## 2017-05-24 NOTE — Telephone Encounter (Signed)
Returned the call to the patient. She stated that her blood pressure has been gradually increasing with the systolic staying in the 320'E-334'D. She does take her blood pressure multiple times, back to back. She has been educated that doing this can increase her blood pressure. Her heart rate as been in the 60's-70's.  She also stated that she has had some increased lower extremity edema but it resolves itself overnight. She will continue her furosemide 20 mg every other day.  She stated that she is going out of town to [employer] and would like to know what can be done for the increased blood pressure. Per Dr. Debara Pickett she may try taking Olmesartan (Benicar) again but 10 mg (half of her 20 mg tablet) She will continue to monitor her blood pressure, taking it only twice daily. She will call back if her blood pressure does not go down and will stop taking the Benicar if her blood pressure drops significantly. She has verbalized her understanding.

## 2017-05-24 NOTE — Telephone Encounter (Signed)
Please call,blood pressure is going up high,At this time is 173/68 and before 191/77.Pt is going out of town tomorrow,needs help with asap.Her ankles are swollen.

## 2017-06-11 ENCOUNTER — Telehealth: Payer: Self-pay | Admitting: Internal Medicine

## 2017-06-11 NOTE — Telephone Encounter (Signed)
Routed to PCP via epic fax function

## 2017-06-11 NOTE — Telephone Encounter (Signed)
Spoke to patient. She states she wants to speak to Dr Debara Pickett concerning  CT SCAN,her primary Dr Luciana Axe. ([facility])  PATIENT STATES Dr Luciana Axe called her after patient had Chest  Xray on 05/22/17 [facility] ( [facility]). Needing to folow up with ct scan. Patient states she does not know why she needs ,she states she did not ask because shewas going out of town.and she has just returned.  "I don't have anything done until a Dr Debara Pickett talks to Dr Luciana Axe." AWARE WILL DEFER TO INFORMATION

## 2017-06-11 NOTE — Telephone Encounter (Signed)
Spoke with Mrs. Zuba - I would recommend she follow-up with the CT scan Dr. Luciana Axe had ordered.  Dr. Lemmie Evens

## 2017-06-11 NOTE — Telephone Encounter (Signed)
Tonya Glass is calling because her primary care provider is wanting her to have a CT Scan and she does not want to do this until she speaks with Dr. Debara Pickett . Also she is wanting him to speak with her Primary Care provider Dr. Precious Haws  at [phone]. Please call   Thanks

## 2017-06-18 ENCOUNTER — Ambulatory Visit (INDEPENDENT_AMBULATORY_CARE_PROVIDER_SITE_OTHER): Payer: Medicare Other | Admitting: *Deleted

## 2017-06-18 DIAGNOSIS — I495 Sick sinus syndrome: Secondary | ICD-10-CM

## 2017-06-18 NOTE — Progress Notes (Signed)
Remote pacemaker transmission.   

## 2017-06-19 ENCOUNTER — Encounter: Payer: Self-pay | Admitting: Cardiology

## 2017-07-01 ENCOUNTER — Telehealth: Payer: Self-pay | Admitting: Internal Medicine

## 2017-07-01 NOTE — Telephone Encounter (Signed)
I agree with the extra lasix - use it until weight returns to normal then go back to previous regimen.  Dr. Lemmie Evens

## 2017-07-01 NOTE — Telephone Encounter (Signed)
Pt aware will continue to monitor will call later with update ./cy

## 2017-07-01 NOTE — Telephone Encounter (Signed)
New message    Pt c/o Shortness Of Breath: STAT if SOB developed within the last 24 hours or pt is noticeably SOB on the phone  1. Are you currently SOB (can you hear that pt is SOB on the phone)? NO  2. How long have you been experiencing SOB? 2 weeks  3. Are you SOB when sitting or when up moving around? MOVING AROUND  4. Are you currently experiencing any other symptoms? DIFFICULTY WALKING  \   Pt c/o swelling: STAT is pt has developed SOB within 24 hours  1) How much weight have you gained and in what time span? NO  2) If swelling, where is the swelling located? Ankles and feet  3) Are you currently taking a fluid pill? YES  4) Are you currently SOB? NO  5) Do you have a log of your daily weights (if so, list)? NO  6) Have you gained 3 pounds in a day or 5 pounds in a week? N/A  7) Have you traveled recently? NO

## 2017-07-01 NOTE — Telephone Encounter (Signed)
Pt called complaining of SOB for 2 weeks swelling to feet and ankles and notes 5-6 lb weight gain has returned from vacation so is not sure if it is all fluid but did cheat on diet consumed more salt than usual B/p has been running high today 171/70 just recent and this am was 193/78 yesterday was 196/68 and later in day was  158/63 Per pt increased Furosemide to 40 mg today  Usually takes furosemide every other day .Will forward to Dr Debara Pickett for review .Adonis Housekeeper

## 2017-07-15 LAB — CUP PACEART REMOTE DEVICE CHECK
Battery Remaining Percentage: 93 %
Brady Statistic RA Percent Paced: 93 %
Brady Statistic RV Percent Paced: 19 %
Lead Channel Impedance Value: 564 Ohm
Lead Channel Pacing Threshold Amplitude: 0.7 V
Lead Channel Pacing Threshold Pulse Width: 0.4 ms
Lead Channel Setting Pacing Amplitude: 2.5 V
Lead Channel Setting Pacing Pulse Width: 0.4 ms
MDC IDC MSMT BATTERY REMAINING LONGEVITY: 60 mo
MDC IDC MSMT LEADCHNL RV IMPEDANCE VALUE: 708 Ohm
MDC IDC PG IMPLANT DT: 20140908
MDC IDC SESS DTM: 20190205095400
MDC IDC SET LEADCHNL RA PACING AMPLITUDE: 2 V
MDC IDC SET LEADCHNL RV SENSING SENSITIVITY: 2.5 mV
Pulse Gen Serial Number: 160873

## 2017-07-23 ENCOUNTER — Other Ambulatory Visit: Payer: Self-pay

## 2017-07-23 ENCOUNTER — Encounter (HOSPITAL_COMMUNITY): Payer: Self-pay

## 2017-07-23 ENCOUNTER — Inpatient Hospital Stay (HOSPITAL_COMMUNITY)
Admission: EM | Admit: 2017-07-23 | Discharge: 2017-07-26 | DRG: 193 | Disposition: A | Discharge status: Home / Self Care | Payer: Medicare Other | Attending: Internal Medicine | Admitting: Internal Medicine

## 2017-07-23 ENCOUNTER — Emergency Department (HOSPITAL_COMMUNITY): Payer: Medicare Other

## 2017-07-23 DIAGNOSIS — I2510 Atherosclerotic heart disease of native coronary artery without angina pectoris: Secondary | ICD-10-CM | POA: Diagnosis present

## 2017-07-23 DIAGNOSIS — E119 Type 2 diabetes mellitus without complications: Secondary | ICD-10-CM | POA: Diagnosis present

## 2017-07-23 DIAGNOSIS — I350 Nonrheumatic aortic (valve) stenosis: Secondary | ICD-10-CM | POA: Diagnosis present

## 2017-07-23 DIAGNOSIS — M19049 Primary osteoarthritis, unspecified hand: Secondary | ICD-10-CM | POA: Diagnosis present

## 2017-07-23 DIAGNOSIS — J181 Lobar pneumonia, unspecified organism: Secondary | ICD-10-CM | POA: Diagnosis not present

## 2017-07-23 DIAGNOSIS — D649 Anemia, unspecified: Secondary | ICD-10-CM | POA: Diagnosis present

## 2017-07-23 DIAGNOSIS — E876 Hypokalemia: Secondary | ICD-10-CM | POA: Diagnosis not present

## 2017-07-23 DIAGNOSIS — J9622 Acute and chronic respiratory failure with hypercapnia: Secondary | ICD-10-CM | POA: Diagnosis present

## 2017-07-23 DIAGNOSIS — E039 Hypothyroidism, unspecified: Secondary | ICD-10-CM | POA: Diagnosis present

## 2017-07-23 DIAGNOSIS — Z8679 Personal history of other diseases of the circulatory system: Secondary | ICD-10-CM

## 2017-07-23 DIAGNOSIS — B9620 Unspecified Escherichia coli [E. coli] as the cause of diseases classified elsewhere: Secondary | ICD-10-CM | POA: Diagnosis present

## 2017-07-23 DIAGNOSIS — W06XXXA Fall from bed, initial encounter: Secondary | ICD-10-CM | POA: Diagnosis not present

## 2017-07-23 DIAGNOSIS — I110 Hypertensive heart disease with heart failure: Secondary | ICD-10-CM | POA: Diagnosis present

## 2017-07-23 DIAGNOSIS — Z79899 Other long term (current) drug therapy: Secondary | ICD-10-CM

## 2017-07-23 DIAGNOSIS — Z66 Do not resuscitate: Secondary | ICD-10-CM | POA: Diagnosis present

## 2017-07-23 DIAGNOSIS — Z87891 Personal history of nicotine dependence: Secondary | ICD-10-CM

## 2017-07-23 DIAGNOSIS — I10 Essential (primary) hypertension: Secondary | ICD-10-CM | POA: Diagnosis present

## 2017-07-23 DIAGNOSIS — F329 Major depressive disorder, single episode, unspecified: Secondary | ICD-10-CM | POA: Diagnosis present

## 2017-07-23 DIAGNOSIS — I452 Bifascicular block: Secondary | ICD-10-CM | POA: Diagnosis present

## 2017-07-23 DIAGNOSIS — J9621 Acute and chronic respiratory failure with hypoxia: Secondary | ICD-10-CM

## 2017-07-23 DIAGNOSIS — Z1612 Extended spectrum beta lactamase (ESBL) resistance: Secondary | ICD-10-CM | POA: Diagnosis present

## 2017-07-23 DIAGNOSIS — R0902 Hypoxemia: Secondary | ICD-10-CM | POA: Diagnosis not present

## 2017-07-23 DIAGNOSIS — Z9889 Other specified postprocedural states: Secondary | ICD-10-CM

## 2017-07-23 DIAGNOSIS — J441 Chronic obstructive pulmonary disease with (acute) exacerbation: Secondary | ICD-10-CM | POA: Diagnosis present

## 2017-07-23 DIAGNOSIS — E785 Hyperlipidemia, unspecified: Secondary | ICD-10-CM | POA: Diagnosis present

## 2017-07-23 DIAGNOSIS — Z950 Presence of cardiac pacemaker: Secondary | ICD-10-CM | POA: Diagnosis not present

## 2017-07-23 DIAGNOSIS — I509 Heart failure, unspecified: Secondary | ICD-10-CM

## 2017-07-23 DIAGNOSIS — J9601 Acute respiratory failure with hypoxia: Secondary | ICD-10-CM

## 2017-07-23 DIAGNOSIS — I5032 Chronic diastolic (congestive) heart failure: Secondary | ICD-10-CM | POA: Diagnosis present

## 2017-07-23 DIAGNOSIS — J440 Chronic obstructive pulmonary disease with acute lower respiratory infection: Secondary | ICD-10-CM | POA: Diagnosis present

## 2017-07-23 DIAGNOSIS — J189 Pneumonia, unspecified organism: Principal | ICD-10-CM

## 2017-07-23 DIAGNOSIS — I495 Sick sinus syndrome: Secondary | ICD-10-CM | POA: Diagnosis present

## 2017-07-23 DIAGNOSIS — Z7982 Long term (current) use of aspirin: Secondary | ICD-10-CM | POA: Diagnosis not present

## 2017-07-23 DIAGNOSIS — E872 Acidosis: Secondary | ICD-10-CM | POA: Diagnosis present

## 2017-07-23 DIAGNOSIS — F419 Anxiety disorder, unspecified: Secondary | ICD-10-CM | POA: Diagnosis present

## 2017-07-23 DIAGNOSIS — Z8673 Personal history of transient ischemic attack (TIA), and cerebral infarction without residual deficits: Secondary | ICD-10-CM

## 2017-07-23 DIAGNOSIS — Z7984 Long term (current) use of oral hypoglycemic drugs: Secondary | ICD-10-CM

## 2017-07-23 DIAGNOSIS — Y92230 Patient room in hospital as the place of occurrence of the external cause: Secondary | ICD-10-CM | POA: Diagnosis not present

## 2017-07-23 DIAGNOSIS — J90 Pleural effusion, not elsewhere classified: Secondary | ICD-10-CM | POA: Diagnosis not present

## 2017-07-23 DIAGNOSIS — Z9884 Bariatric surgery status: Secondary | ICD-10-CM

## 2017-07-23 DIAGNOSIS — J449 Chronic obstructive pulmonary disease, unspecified: Secondary | ICD-10-CM | POA: Diagnosis not present

## 2017-07-23 DIAGNOSIS — Z953 Presence of xenogenic heart valve: Secondary | ICD-10-CM

## 2017-07-23 HISTORY — DX: Other hypotension: I95.89

## 2017-07-23 HISTORY — DX: Chronic obstructive pulmonary disease, unspecified: J44.9

## 2017-07-23 HISTORY — DX: Pneumonia, unspecified organism: J18.9

## 2017-07-23 HISTORY — DX: Heart failure, unspecified: I50.9

## 2017-07-23 HISTORY — DX: Personal history of urinary calculi: Z87.442

## 2017-07-23 HISTORY — DX: Nonrheumatic aortic (valve) stenosis: I35.0

## 2017-07-23 LAB — BRAIN NATRIURETIC PEPTIDE: B Natriuretic Peptide: 520.3 pg/mL — ABNORMAL HIGH (ref 0.0–100.0)

## 2017-07-23 LAB — LACTIC ACID, PLASMA
LACTIC ACID, VENOUS: 1.6 mmol/L (ref 0.5–1.9)
Lactic Acid, Venous: 2.1 mmol/L (ref 0.5–1.9)

## 2017-07-23 LAB — I-STAT VENOUS BLOOD GAS, ED
Acid-base deficit: 4 mmol/L — ABNORMAL HIGH (ref 0.0–2.0)
Acid-base deficit: 2 mmol/L (ref 0.0–2.0)
Bicarbonate: 25.5 mmol/L (ref 20.0–28.0)
Bicarbonate: 23.8 mmol/L (ref 20.0–28.0)
O2 Saturation: 33 %
O2 Saturation: 82 %
PH VEN: 7.171 — AB (ref 7.250–7.430)
PO2 VEN: 26 mmHg — AB (ref 32.0–45.0)
TCO2: 28 mmol/L (ref 22–32)
TCO2: 25 mmol/L (ref 22–32)
pCO2, Ven: 69.8 mmHg — ABNORMAL HIGH (ref 44.0–60.0)
pCO2, Ven: 42.9 mmHg — ABNORMAL LOW (ref 44.0–60.0)
pH, Ven: 7.352 (ref 7.250–7.430)
pO2, Ven: 49 mmHg — ABNORMAL HIGH (ref 32.0–45.0)

## 2017-07-23 LAB — URINALYSIS, MICROSCOPIC (REFLEX)

## 2017-07-23 LAB — CBC WITH DIFFERENTIAL/PLATELET
BASOS ABS: 0 10*3/uL (ref 0.0–0.1)
BASOS PCT: 0 %
EOS ABS: 0.3 10*3/uL (ref 0.0–0.7)
Eosinophils Relative: 2 %
HCT: 32.9 % — ABNORMAL LOW (ref 36.0–46.0)
Hemoglobin: 9.8 g/dL — ABNORMAL LOW (ref 12.0–15.0)
Lymphocytes Relative: 13 %
Lymphs Abs: 2.2 10*3/uL (ref 0.7–4.0)
MCH: 26.1 pg (ref 26.0–34.0)
MCHC: 29.8 g/dL — ABNORMAL LOW (ref 30.0–36.0)
MCV: 87.5 fL (ref 78.0–100.0)
MONO ABS: 0.3 10*3/uL (ref 0.1–1.0)
MONOS PCT: 2 %
Neutro Abs: 14.8 10*3/uL — ABNORMAL HIGH (ref 1.7–7.7)
Neutrophils Relative %: 83 %
Platelets: 380 10*3/uL (ref 150–400)
RBC: 3.76 MIL/uL — ABNORMAL LOW (ref 3.87–5.11)
RDW: 16 % — ABNORMAL HIGH (ref 11.5–15.5)
WBC: 17.7 10*3/uL — ABNORMAL HIGH (ref 4.0–10.5)

## 2017-07-23 LAB — CBG MONITORING, ED
Glucose-Capillary: 190 mg/dL — ABNORMAL HIGH (ref 65–99)
Glucose-Capillary: 226 mg/dL — ABNORMAL HIGH (ref 65–99)

## 2017-07-23 LAB — HEMOGLOBIN A1C
Hgb A1c MFr Bld: 5.6 % (ref 4.8–5.6)
MEAN PLASMA GLUCOSE: 114.02 mg/dL

## 2017-07-23 LAB — URINALYSIS, ROUTINE W REFLEX MICROSCOPIC
BILIRUBIN URINE: NEGATIVE
Glucose, UA: NEGATIVE mg/dL
Ketones, ur: NEGATIVE mg/dL
Leukocytes, UA: NEGATIVE
Nitrite: POSITIVE — AB
PROTEIN: 100 mg/dL — AB
Specific Gravity, Urine: >1.03 — ABNORMAL HIGH (ref 1.005–1.030)
pH: 5.5 (ref 5.0–8.0)

## 2017-07-23 LAB — INFLUENZA PANEL BY PCR (TYPE A & B)
Influenza A By PCR: NEGATIVE
Influenza B By PCR: NEGATIVE

## 2017-07-23 LAB — BASIC METABOLIC PANEL
Anion gap: 16 — ABNORMAL HIGH (ref 5–15)
BUN: 18 mg/dL (ref 6–20)
CHLORIDE: 103 mmol/L (ref 101–111)
CO2: 21 mmol/L — AB (ref 22–32)
Calcium: 8.5 mg/dL — ABNORMAL LOW (ref 8.9–10.3)
Creatinine, Ser: 0.93 mg/dL (ref 0.44–1.00)
GFR calc Af Amer: >60 mL/min (ref >60)
GFR calc non Af Amer: >60 mL/min (ref >60)
Glucose, Bld: 285 mg/dL — ABNORMAL HIGH (ref 65–99)
POTASSIUM: 4 mmol/L (ref 3.5–5.1)
SODIUM: 140 mmol/L (ref 135–145)

## 2017-07-23 LAB — GLUCOSE, CAPILLARY: Glucose-Capillary: 227 mg/dL — ABNORMAL HIGH (ref 65–99)

## 2017-07-23 LAB — I-STAT CG4 LACTIC ACID, ED: Lactic Acid, Venous: 2.74 mmol/L (ref 0.5–1.9)

## 2017-07-23 LAB — PROCALCITONIN: Procalcitonin: 0.98 ng/mL

## 2017-07-23 LAB — STREP PNEUMONIAE URINARY ANTIGEN: STREP PNEUMO URINARY ANTIGEN: NEGATIVE

## 2017-07-23 LAB — I-STAT TROPONIN, ED: TROPONIN I, POC: 0 ng/mL (ref 0.00–0.08)

## 2017-07-23 MED ORDER — FUROSEMIDE 20 MG PO TABS: 20.0000 mg | ORAL_TABLET | ORAL | Status: DC

## 2017-07-23 MED ORDER — IPRATROPIUM-ALBUTEROL 0.5-2.5 (3) MG/3ML IN SOLN
3.0000 mL | RESPIRATORY_TRACT | Status: DC
  Administered 2017-07-23 (×3): 3 mL via RESPIRATORY_TRACT
  Filled 2017-07-23 (×3): qty 3

## 2017-07-23 MED ORDER — ONDANSETRON HCL 4 MG/2ML IJ SOLN: 4.0000 mg | Freq: Four times a day (QID) | INTRAMUSCULAR | Status: DC | PRN

## 2017-07-23 MED ORDER — GUAIFENESIN ER 600 MG PO TB12: 600.0000 mg | ORAL_TABLET | Freq: Two times a day (BID) | ORAL | Status: DC | PRN

## 2017-07-23 MED ORDER — NITROGLYCERIN IN D5W 200-5 MCG/ML-% IV SOLN
0.0000 ug/min | Freq: Once | INTRAVENOUS | Status: AC
  Administered 2017-07-23: 5 ug/min via INTRAVENOUS
  Filled 2017-07-23: qty 250

## 2017-07-23 MED ORDER — PIPERACILLIN-TAZOBACTAM 3.375 G IVPB: 3.3750 g | Freq: Three times a day (TID) | INTRAVENOUS | Status: DC

## 2017-07-23 MED ORDER — ENOXAPARIN SODIUM 40 MG/0.4ML ~~LOC~~ SOLN
40.0000 mg | SUBCUTANEOUS | Status: DC
  Administered 2017-07-23 – 2017-07-26 (×3): 40 mg via SUBCUTANEOUS
  Filled 2017-07-23 (×4): qty 0.4

## 2017-07-23 MED ORDER — METHYLPREDNISOLONE SODIUM SUCC 125 MG IJ SOLR
60.0000 mg | Freq: Two times a day (BID) | INTRAMUSCULAR | Status: DC
  Administered 2017-07-23 – 2017-07-24 (×2): 60 mg via INTRAVENOUS
  Filled 2017-07-23 (×2): qty 2

## 2017-07-23 MED ORDER — SODIUM CHLORIDE 0.9 % IV SOLN: 1.0000 g | INTRAVENOUS | Status: DC

## 2017-07-23 MED ORDER — TRAMADOL HCL 50 MG PO TABS: 50.0000 mg | ORAL_TABLET | Freq: Four times a day (QID) | ORAL | Status: DC | PRN

## 2017-07-23 MED ORDER — SODIUM CHLORIDE 0.9 % IV BOLUS (SEPSIS)
500.0000 mL | Freq: Once | INTRAVENOUS | Status: AC
  Administered 2017-07-23: 500 mL via INTRAVENOUS

## 2017-07-23 MED ORDER — METHYLPREDNISOLONE SODIUM SUCC 125 MG IJ SOLR
125.0000 mg | Freq: Once | INTRAMUSCULAR | Status: AC
Start: 2017-07-23 — End: 2017-07-23
  Administered 2017-07-23: 125 mg via INTRAVENOUS
  Filled 2017-07-23: qty 2

## 2017-07-23 MED ORDER — ACETAMINOPHEN 650 MG RE SUPP: 650.0000 mg | Freq: Four times a day (QID) | RECTAL | Status: DC | PRN

## 2017-07-23 MED ORDER — ALPRAZOLAM 0.25 MG PO TABS
0.2500 mg | ORAL_TABLET | Freq: Every day | ORAL | Status: DC | PRN
  Administered 2017-07-24 – 2017-07-25 (×2): 0.25 mg via ORAL
  Filled 2017-07-23 (×2): qty 1

## 2017-07-23 MED ORDER — SODIUM CHLORIDE 0.9 % IV SOLN
1.0000 g | INTRAVENOUS | Status: DC
  Administered 2017-07-24 – 2017-07-25 (×2): 1 g via INTRAVENOUS
  Filled 2017-07-23 (×3): qty 10

## 2017-07-23 MED ORDER — VANCOMYCIN HCL IN DEXTROSE 750-5 MG/150ML-% IV SOLN
750.0000 mg | Freq: Two times a day (BID) | INTRAVENOUS | Status: DC
  Administered 2017-07-23: 750 mg via INTRAVENOUS
  Filled 2017-07-23: qty 150

## 2017-07-23 MED ORDER — SODIUM CHLORIDE 0.9 % IV SOLN
500.0000 mg | INTRAVENOUS | Status: DC
  Administered 2017-07-23: 500 mg via INTRAVENOUS
  Filled 2017-07-23: qty 500

## 2017-07-23 MED ORDER — ALBUTEROL SULFATE (2.5 MG/3ML) 0.083% IN NEBU: 2.5000 mg | INHALATION_SOLUTION | RESPIRATORY_TRACT | Status: DC | PRN

## 2017-07-23 MED ORDER — ONDANSETRON HCL 4 MG PO TABS: 4.0000 mg | ORAL_TABLET | Freq: Four times a day (QID) | ORAL | Status: DC | PRN

## 2017-07-23 MED ORDER — IPRATROPIUM-ALBUTEROL 0.5-2.5 (3) MG/3ML IN SOLN
3.0000 mL | Freq: Four times a day (QID) | RESPIRATORY_TRACT | Status: DC
  Administered 2017-07-24: 3 mL via RESPIRATORY_TRACT
  Filled 2017-07-23: qty 3

## 2017-07-23 MED ORDER — LACTATED RINGERS IV SOLN
INTRAVENOUS | Status: DC
  Administered 2017-07-23 – 2017-07-24 (×2): via INTRAVENOUS

## 2017-07-23 MED ORDER — IRBESARTAN 75 MG PO TABS
150.0000 mg | ORAL_TABLET | Freq: Every day | ORAL | Status: DC
  Administered 2017-07-23 – 2017-07-26 (×4): 150 mg via ORAL
  Filled 2017-07-23: qty 1
  Filled 2017-07-23 (×3): qty 2

## 2017-07-23 MED ORDER — FLUOXETINE HCL 20 MG PO CAPS
20.0000 mg | ORAL_CAPSULE | Freq: Every day | ORAL | Status: DC
  Administered 2017-07-23 – 2017-07-26 (×4): 20 mg via ORAL
  Filled 2017-07-23 (×4): qty 1

## 2017-07-23 MED ORDER — CLONAZEPAM 1 MG PO TABS
1.0000 mg | ORAL_TABLET | Freq: Every day | ORAL | Status: DC
  Administered 2017-07-23 – 2017-07-25 (×3): 1 mg via ORAL
  Filled 2017-07-23 (×4): qty 1

## 2017-07-23 MED ORDER — ASPIRIN 325 MG PO TABS
325.0000 mg | ORAL_TABLET | Freq: Every day | ORAL | Status: DC
  Administered 2017-07-23 – 2017-07-26 (×4): 325 mg via ORAL
  Filled 2017-07-23 (×4): qty 1

## 2017-07-23 MED ORDER — BUPROPION HCL ER (XL) 150 MG PO TB24
150.0000 mg | ORAL_TABLET | Freq: Every day | ORAL | Status: DC
  Administered 2017-07-23 – 2017-07-26 (×3): 150 mg via ORAL
  Filled 2017-07-23 (×4): qty 1

## 2017-07-23 MED ORDER — LEVOTHYROXINE SODIUM 50 MCG PO TABS
50.0000 ug | ORAL_TABLET | Freq: Every day | ORAL | Status: DC
  Administered 2017-07-24 – 2017-07-26 (×3): 50 ug via ORAL
  Filled 2017-07-23 (×3): qty 1

## 2017-07-23 MED ORDER — BISACODYL 10 MG RE SUPP: 10.0000 mg | Freq: Every day | RECTAL | Status: DC | PRN

## 2017-07-23 MED ORDER — SENNOSIDES-DOCUSATE SODIUM 8.6-50 MG PO TABS: 1.0000 | ORAL_TABLET | Freq: Every evening | ORAL | Status: DC | PRN

## 2017-07-23 MED ORDER — PIPERACILLIN-TAZOBACTAM 3.375 G IVPB 30 MIN
3.3750 g | Freq: Once | INTRAVENOUS | Status: AC
  Administered 2017-07-23: 3.375 g via INTRAVENOUS
  Filled 2017-07-23: qty 50

## 2017-07-23 MED ORDER — AZITHROMYCIN 500 MG IV SOLR
500.0000 mg | INTRAVENOUS | Status: DC
  Administered 2017-07-24 – 2017-07-26 (×3): 500 mg via INTRAVENOUS
  Filled 2017-07-23 (×3): qty 500

## 2017-07-23 MED ORDER — ATORVASTATIN CALCIUM 40 MG PO TABS
40.0000 mg | ORAL_TABLET | Freq: Every day | ORAL | Status: DC
  Administered 2017-07-23 – 2017-07-26 (×4): 40 mg via ORAL
  Filled 2017-07-23 (×4): qty 1

## 2017-07-23 MED ORDER — ACETAMINOPHEN 325 MG PO TABS: 650.0000 mg | ORAL_TABLET | Freq: Four times a day (QID) | ORAL | Status: DC | PRN

## 2017-07-23 MED ORDER — CARVEDILOL 12.5 MG PO TABS
12.5000 mg | ORAL_TABLET | Freq: Two times a day (BID) | ORAL | Status: DC
  Administered 2017-07-23 – 2017-07-26 (×7): 12.5 mg via ORAL
  Filled 2017-07-23 (×7): qty 1

## 2017-07-23 MED ORDER — INSULIN ASPART 100 UNIT/ML ~~LOC~~ SOLN
0.0000 [IU] | Freq: Three times a day (TID) | SUBCUTANEOUS | Status: DC
  Administered 2017-07-23: 2 [IU] via SUBCUTANEOUS
  Administered 2017-07-23 – 2017-07-24 (×2): 3 [IU] via SUBCUTANEOUS
  Administered 2017-07-24: 2 [IU] via SUBCUTANEOUS
  Administered 2017-07-24: 3 [IU] via SUBCUTANEOUS
  Administered 2017-07-25: 2 [IU] via SUBCUTANEOUS
  Administered 2017-07-25 (×2): 3 [IU] via SUBCUTANEOUS
  Administered 2017-07-26: 1 [IU] via SUBCUTANEOUS
  Filled 2017-07-23 (×2): qty 1

## 2017-07-23 NOTE — ED Notes (Signed)
Carb Modified diet lunch tray ordered at 1154. 

## 2017-07-23 NOTE — ED Notes (Signed)
I-STAT VENOUS BLOOD GAS RESULTS GIVEN TO Thomasene Lot, MD

## 2017-07-23 NOTE — ED Triage Notes (Signed)
Pt presents via [facility] for evaluation of sob, pt on bipap from home with O2 sats in 40's on RA. Pt does not wear home O2. Pt tolerating bipap well, sats upper 80's on arrival. Pt able to speak in short phrases.

## 2017-07-23 NOTE — H&P (Addendum)
History and Physical    Tonya Glass XBM:[id number] DOB: 1945-10-11 DOA: 07/23/2017   PCP: Bartholome Bill, MD   Patient coming from:  Home    Chief Complaint: SHortness of breath and dry cough  HPI: Tonya Glass is a 72 y.o. female with medical history significant for CHF, DM, HTN,HLD,  anxiety, prior CVA, hypothyroidism, depression, COPD not on O2 at home, presenting to the emergency department via EMS, for evaluation of progressively worsening shortness of breath.  The patient had been shortness of breath and cough over the last month, but since 6 PM last night, her breathing worsened for which she sought medical attention.  She reports not being able to walk more than 20 feet without coughing. She denies any fever or chills, nasal drainage ,hemoptysis, or sputum production.  She does report sick contacts in several members of her family.  Denies any nausea or vomiting.  She denies any headaches.  She denies any chest pain or palpitations.  She denies any pleuritic chest pain.  She denies any abdominal pain.  She denies any leg swelling or calf pain.  No recent long distance trips.  She denies any tobacco abuse, or recreational drug use.   ED Course:  BP (!) 145/63   Pulse 78   Temp 98.5 F (36.9 C) (Oral)   Resp (!) 28   Wt 77.6 kg (171 lb)   SpO2 93%   BMI 32.31 kg/m   On presentation, her O2 sats were in the 40s on room air, she was placed on BiPAP, with O2 sat rising to the 80s, at which point she was started on  Received Solumedrol 125 mg inj  And nebulizer treatments with good response, then changing to 2 L [place]  CXR with extensive consolidation throughout most of the right lung consistent with multifocal pneumonia WBC 17  ABG ph 7.17 and CO2 69.8 on presentation .  Repeat ABGs showed a pH of 7.352, with PCO2 of 42.9.  Bicarb is 25. 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 Anxiety   . Aortic valve stenosis 10/10/2016  . Arthritis    "fingers" (02/25/2013)  . COPD (chronic obstructive pulmonary disease) ([place])   . Depression   . Gallstones   . Headache(784.0)   . Heart murmur   . High cholesterol   . Hypertension   . Hypothyroidism   . Migraines    "none in years til 01/2013" (02/25/2013)  . Orthopnoea    "just since open heart surgery" (02/25/2013)  . Pacemaker    [employer]   . Stroke [facility]) 2001   denies residual on 02/25/2013  . Symptomatic hypotension 10/10/2016  . Syncope and collapse    "prior to open heart surgery" (02/25/2013)  . Type II diabetes mellitus ([place])   . Walking pneumonia ~ 2012    Past Surgical History:  Procedure Laterality Date  . CARDIAC VALVE REPLACEMENT  01/26/2013   "cow valve put in; widened the second valve" (02/25/2013) - [place], [place]  . INSERT / REPLACE / REMOVE PACEMAKER  01/19/2013   [employer] - dual chamber  . [place] RESECTION  2009  in [place]    Social History Social History   Socioeconomic History  . Marital status: Single    Spouse name: Not on file  . Number of children: 2  . Years of education: dental sch  . Highest education level: Not on file  Social Needs  . Financial resource strain: Not on file  . Food insecurity - worry: Not on file  . Food insecurity - inability: Not on file  . Transportation needs - medical: Not on file  . Transportation needs - non-medical: Not on file  Occupational History  . Occupation: retired  Tobacco Use  . Smoking status: Former Smoker    Packs/day: 2.00    Years: 40.00    Pack years: 80.00    Types: Cigarettes    Last attempt to quit: 05/29/1999    Years since quitting: 18.1  . Smokeless tobacco: Never Used  .  Tobacco comment: 02/25/2013 "quit smoking in 2001"  Substance and Sexual Activity  . Alcohol use: No  . Drug use: No  . Sexual activity: No  Other Topics Concern  . Not on file  Social History Narrative  . Not on file     No Known Allergies  Family History  Problem Relation Age of Onset  . Lung cancer Mother   . Heart Problems Father   . Hypertension Brother   . Colon polyps Brother   . Skin cancer Brother   . Colon polyps Daughter   . Diabetes Maternal Aunt   . Breast cancer Maternal Aunt        mets to liver  . Diabetes Paternal Aunt       Prior to Admission medications   Medication Sig Start Date End Date Taking? Authorizing Provider  albuterol (PROVENTIL HFA;VENTOLIN HFA) 108 (90 BASE) MCG/ACT inhaler Inhale 2 puffs into the lungs. Use as directed -  With exercise 12/08/13   [provider]  ALPRAZolam Duanne Moron) 0.25 MG tablet Take 0.25 mg by mouth daily as needed.     [provider]  aspirin 325 MG tablet Take 325 mg by mouth daily.    [provider]  atorvastatin (LIPITOR) 40 MG tablet Take 40 mg by mouth daily.    [provider]  buPROPion (WELLBUTRIN XL) 150 MG 24 hr tablet Take 150 mg by mouth daily.    [provider]  carvedilol (COREG) 12.5 MG tablet Take 1 tablet (12.5 mg total) 2 (two) times daily by mouth. 03/22/17   Croitoru, Mihai, MD  clonazePAM (KLONOPIN) 1 MG tablet TAKE ONE TABLET BY MOUTH NIGHTLY AS NEEDED FOR ANXIETY 02/20/16   [provider]  FLUoxetine (PROZAC) 20 MG capsule Take 20 mg by mouth daily.    [provider]  furosemide (LASIX) 20 MG tablet Take 20 mg every other day 10/26/16   Pixie Casino, MD  levothyroxine (SYNTHROID, LEVOTHROID) 50 MCG tablet Take 50 mcg by mouth daily before breakfast.    [provider]  metFORMIN (GLUCOPHAGE) 500 MG tablet Take 2 tablets by mouth 2 (two) times daily. 09/04/15   [provider]  olmesartan (BENICAR) 20 MG tablet Take  0.5 tablets (10 mg total) by mouth daily. 05/24/17   Hilty, Nadean Corwin, MD  Tiotropium Bromide-Olodaterol (STIOLTO RESPIMAT) 2.5-2.5 MCG/ACT AERS Inhale 2 puffs into the lungs daily.    [provider]  traMADol (ULTRAM) 50 MG tablet Take 50 mg by mouth as needed. 07/23/13   [provider]    Physical Exam:  Vitals:  07/23/17 0900 07/23/17 0915 07/23/17 0930 07/23/17 1000  BP: 138/61 (!) 132/59 (!) 138/59 (!) 145/63  Pulse: 75 76 75 78  Resp: (!) 21 (!) 22 (!) 22 (!) 28  Temp:      TempSrc:      SpO2: 99% 99% 99% 93%  Weight:       Constitutional: NAD, calm, more comfortable since admission  Eyes: PERRL, lids and conjunctivae normal ENMT: Mucous membranes are moist, without exudate or lesions  Neck: normal, supple, no masses, no thyromegaly Respiratory:  no wheezing, diffuse rhonchi, no rubs. Improving  respiratory effort  Cardiovascular: Regular rate and rhythm, 2/6  murmur, rubs or gallops. No extremity edema. 2+ pedal pulses. No carotid bruits.  Abdomen: Soft, non tender, No hepatosplenomegaly. Bowel sounds positive.  Musculoskeletal: no clubbing / cyanosis. Moves all extremities Skin: no jaundice, No lesions.  Neurologic: Sensation intact  Strength equal in all extremities Psychiatric:   Alert and oriented x 3. Normal mood.     Labs on Admission: I have personally reviewed following labs and imaging studies  CBC: Recent Labs  Lab 07/23/17 0751  WBC 17.7*  NEUTROABS 14.8*  HGB 9.8*  HCT 32.9*  MCV 87.5  PLT 474    Basic Metabolic Panel: Recent Labs  Lab 07/23/17 0751  NA 140  K 4.0  CL 103  CO2 21*  GLUCOSE 285*  BUN 18  CREATININE 0.93  CALCIUM 8.5*    GFR: Estimated Creatinine Clearance: 52.3 mL/min (by C-G formula based on SCr of 0.93 mg/dL).  Liver Function Tests: No results for input(s): AST, ALT, ALKPHOS, BILITOT, PROT, ALBUMIN in the last 168 hours. No results for input(s): LIPASE, AMYLASE in the last 168 hours. No results  for input(s): AMMONIA in the last 168 hours.  Coagulation Profile: No results for input(s): INR, PROTIME in the last 168 hours.  Cardiac Enzymes: No results for input(s): CKTOTAL, CKMB, CKMBINDEX, TROPONINI in the last 168 hours.  BNP (last 3 results) Recent Labs    08/06/16 1255  PROBNP 2,468*    HbA1C: No results for input(s): HGBA1C in the last 72 hours.  CBG: No results for input(s): GLUCAP in the last 168 hours.  Lipid Profile: No results for input(s): CHOL, HDL, LDLCALC, TRIG, CHOLHDL, LDLDIRECT in the last 72 hours.  Thyroid Function Tests: No results for input(s): TSH, T4TOTAL, FREET4, T3FREE, THYROIDAB in the last 72 hours.  Anemia Panel: No results for input(s): VITAMINB12, FOLATE, FERRITIN, TIBC, IRON, RETICCTPCT in the last 72 hours.  Urine analysis: No results found for: COLORURINE, APPEARANCEUR, LABSPEC, PHURINE, GLUCOSEU, HGBUR, BILIRUBINUR, KETONESUR, PROTEINUR, UROBILINOGEN, NITRITE, LEUKOCYTESUR  Sepsis Labs: @LABRCNTIP (procalcitonin:4,lacticidven:4) )No results found for this or any previous visit (from the past 240 hour(s)).   Radiological Exams on Admission: Dg Chest Portable 1 View  Result Date: 07/23/2017 CLINICAL DATA:  Shortness of Breath EXAM: PORTABLE CHEST 1 VIEW COMPARISON:  Chest radiograph May 21, 2017 and chest CT June 14, 2017 FINDINGS: There is airspace consolidation throughout much the right lung with the greatest degree of concentration of consolidation in the right mid to lower lung regions. Left lung is clear. Heart is mildly enlarged with pulmonary vascularity within normal limits. Patient is status post aortic valve replacement. Pacemaker lead tips are attached to the right atrium and right ventricle. Patient is status post coronary artery bypass grafting. No adenopathy. No bone lesions. IMPRESSION: Extensive consolidation throughout much of the right lung consistent with multifocal pneumonia. Left lung clear. Stable cardiac  prominence. Postoperative changes as  noted. Electronically Signed   By: Lowella Grip III M.D.   On: 07/23/2017 08:09    EKG: Independently reviewed.  Assessment/Plan Principal Problem:   Acute respiratory failure with hypoxia (HCC) Active Problems:   Pneumonia   Pacemaker   Essential hypertension   Dyslipidemia   S/P aortic valve replacement with bioprosthetic valve   DM2 (diabetes mellitus, type 2) (HCC)   Chronic heart failure (HCC)   H/O aortic root repair   CAD (coronary artery disease)   Sick sinus syndrome (HCC)   Hypothyroidism   Acute Respiratory Failure with Hypoxia likely due to Pneumonia. CURB 65=2 in a patient with a h/o COPD not on home O2Presenting now with ongoing / progressive symptoms including nonproductive cough and increasing shortness of breath over the last 24 hrs CXR today shows extensive consolidation throughout most of the right lung consistent with multifocal pneumonia.  Afebrile,  WBC 17 Lactic acid 2.74 on presentation, new value pending Bicarb 25 . Initially on Bipap, now on 2 L O2 [place] Received Solumedrol 125 mg inj  And nebulizer treatments with good response; she also was given Vanc and Zosyn. Admit to Medsurg IP Oxygen   sputum cultures, urine cultures   IV antibiotics with per protocol with Zithromax and Rocephin per Pharmacy dose Procalcitonin,Strep pneumo urine antigen, Legionella urine antigen Nebulizers as needed, with Duoneb q 4 and Albuterol q 2 h prn for wheezing Mucinex prn  Repeat CBC in am   Chronic Diastolic CHF:  weight 115 lbs. BNP was elevated in view of O2 demand. CXR no effusion .Last 2D echo in April 2018 shows normal systolic function, EF 72-62%, grade 2 diastolic Continue meds  Hold Beta Blocker Obtain daily weights Monitor intake and output  Hypothyroidism: Continue home Synthroid  Type II Diabetes Current blood sugar level is 285 No results found for: HGBA1C Hgb A1C Hold home oral diabetic medications.   SSI   Hypertension BP (!) 145/63   Pulse 78  . Patient had been tempoarily placed on Nitro drip on presentation for very elevated BP 035D systolic with good response.   Continue home anti-hypertensive medications  Add Hydralazine Q6 hours as needed for BP 160/90    Hyperlipidemia Continue home statins  Anxiety and Depression Continue Xanax for anxiety and  Wellbutrin, Prozac  for depression   History of valvular disease/S/P PMP. AVR  no acute issues. EKG No acute changes. 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 Can be followed as OP by Cards   DVT prophylaxis:  Lovenox Code Status:    Full  Family Communication:  Discussed with patient and daughter  Disposition Plan: Expect patient to be discharged to home after condition improves Consults called:    None Admission status: Medsurg IP    Sharene Butters, PA-C [facility]   Amion text  [id number]   07/23/2017, 11:15 AM

## 2017-07-23 NOTE — Progress Notes (Signed)
Pharmacy Antibiotic Note  Tonya Glass is a 72 y.o. female admitted on 07/23/2017 with pneumonia.    Plan: Zosyn 3.375 gm iv q8h Vancomycin 750 mg q12h Monitor renal fx cx vt prn  Weight: 171 lb (77.6 kg)  Temp (24hrs), Avg:98.5 F (36.9 C), Min:98.5 F (36.9 C), Max:98.5 F (36.9 C)  Recent Labs  Lab 07/23/17 0751  WBC 17.7*  CREATININE 0.93    Estimated Creatinine Clearance: 52.3 mL/min (by C-G formula based on SCr of 0.93 mg/dL).    No Known Allergies  Levester Fresh, PharmD, BCPS, BCCCP Clinical Pharmacist Clinical phone for 07/23/2017 from 7a-3:30p: [phone] If after 3:30p, please call main pharmacy at: [phone] 07/23/2017 8:29 AM

## 2017-07-23 NOTE — ED Notes (Signed)
Attempted report 

## 2017-07-23 NOTE — Progress Notes (Signed)
New Admission Note: Pt admitted to room 5M16 from 5 C ED overflow  Arrival Method: via bed Mental Orientation: alert and oriented x 4  Telemetry: None at this time Assessment: Completed Skin: Intact IV: R hand with LR at 75 Pain: Denies  Tubes: None Safety Measures: Safety Fall Prevention Plan has been discussed  Admission: to be completed [address] [place] Orientation: Patient has been orientated to the room, unit and staff.  Family: None at the bedside  Orders to be reviewed and implemented. Will continue to monitor the patient. Call light has been placed within reach and bed alarm has been activated.   Mady Gemma, BSN, RN-BC Phone: [phone]

## 2017-07-23 NOTE — ED Notes (Signed)
Pt eating at this time.  Will give HHN when finished.

## 2017-07-23 NOTE — ED Notes (Signed)
Pt resting quietly at this time with eyes closed 

## 2017-07-23 NOTE — ED Provider Notes (Signed)
[facility] EMERGENCY DEPARTMENT Provider Note   CSN: [id number] Arrival date & time: 07/23/17  2025     History   Chief Complaint Chief Complaint  Patient presents with  . Shortness of Breath    HPI Tonya Glass is a 72 y.o. female.  The history is provided by the patient and medical records. No language interpreter was used.  Shortness of Breath  Associated symptoms include cough.   Tonya Glass is a 72 y.o. female  with a PMH of CHF, DM, HTN,  who presents to the Emergency Department complaining of shortness of breath which has been progressively worsening.  At 6 PM tonight, breathing acutely worsened and she called EMS.  Per EMS report, O2 in the 40s on room air upon their arrival.  She reports not wearing oxygen at home.  BiPAP was placed in the field and O2 improved to mid 80s.  She was given 2 DuoNeb's prior to emergency department arrival which she feels may have helped. Denies chest pain.   Level V caveat applies 2/2 acuity of condition.    Past Medical History:  Diagnosis Date  . Anxiety   . Arthritis    "fingers" (02/25/2013)  . Depression   . Gallstones   . Headache(784.0)   . Heart murmur   . High cholesterol   . Hypertension   . Hypothyroidism   . Migraines    "none in years til 01/2013" (02/25/2013)  . Orthopnoea    "just since open heart surgery" (02/25/2013)  . Pacemaker    [employer]   . Stroke [facility]) 2001   denies residual on 02/25/2013  . Syncope and collapse    "prior to open heart surgery" (02/25/2013)  . Type II diabetes mellitus ([place])   . Walking pneumonia ~ 2012    Patient Active Problem List   Diagnosis Date Noted  . Symptomatic hypotension 10/10/2016  . Aortic valve stenosis 10/10/2016  . Shortness of breath 08/02/2016  . Diastolic murmur 42/70/6237  . Sick sinus syndrome ([place]) 03/20/2016  . 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  . CAD (coronary artery disease) 09/06/2014  . Pacemaker 05/28/2013  .  Essential hypertension 05/28/2013  . Dyslipidemia 05/28/2013  . S/P aortic valve replacement with bioprosthetic valve 05/28/2013  . DM2 (diabetes mellitus, type 2) ([place]) 05/28/2013  . Chronic systolic heart failure ([place]) 05/28/2013    Past Surgical History:  Procedure Laterality Date  . CARDIAC VALVE REPLACEMENT  01/26/2013   "cow valve put in; widened the second valve" (02/25/2013) - [place], [place]  . INSERT / REPLACE / REMOVE PACEMAKER  01/19/2013   [employer] - dual chamber  . [place] RESECTION  2009   in [place]    OB History    No data available       Home Medications    Prior to Admission medications   Medication Sig Start Date End Date Taking? Authorizing Provider  albuterol (PROVENTIL HFA;VENTOLIN HFA) 108 (90 BASE) MCG/ACT inhaler Inhale 2 puffs into the lungs. Use as directed -  With exercise 12/08/13   [provider]  ALPRAZolam Duanne Moron) 0.25 MG tablet Take 0.25 mg by mouth daily as needed.     [provider]  aspirin 325 MG tablet Take 325 mg by mouth daily.    [provider]  atorvastatin (LIPITOR) 40 MG tablet Take 40 mg by mouth daily.    [provider]  buPROPion (WELLBUTRIN XL) 150 MG 24  hr tablet Take 150 mg by mouth daily.    [provider]  carvedilol (COREG) 12.5 MG tablet Take 1 tablet (12.5 mg total) 2 (two) times daily by mouth. 03/22/17   Croitoru, Mihai, MD  clonazePAM (KLONOPIN) 1 MG tablet TAKE ONE TABLET BY MOUTH NIGHTLY AS NEEDED FOR ANXIETY 02/20/16   [provider]  FLUoxetine (PROZAC) 20 MG capsule Take 20 mg by mouth daily.    [provider]  furosemide (LASIX) 20 MG tablet Take 20 mg every other day 10/26/16   Pixie Casino, MD  levothyroxine (SYNTHROID, LEVOTHROID) 50 MCG tablet Take 50 mcg by mouth daily before breakfast.    [provider]  metFORMIN (GLUCOPHAGE) 500 MG tablet Take 2 tablets by mouth 2 (two) times  daily. 09/04/15   [provider]  olmesartan (BENICAR) 20 MG tablet Take 0.5 tablets (10 mg total) by mouth daily. 05/24/17   Hilty, Nadean Corwin, MD  Tiotropium Bromide-Olodaterol (STIOLTO RESPIMAT) 2.5-2.5 MCG/ACT AERS Inhale 2 puffs into the lungs daily.    [provider]  traMADol (ULTRAM) 50 MG tablet Take 50 mg by mouth as needed. 07/23/13   [provider]    Family History Family History  Problem Relation Age of Onset  . Lung cancer Mother   . Heart Problems Father   . Hypertension Brother   . Colon polyps Brother   . Skin cancer Brother   . Colon polyps Daughter   . Diabetes Maternal Aunt   . Breast cancer Maternal Aunt        mets to liver  . Diabetes Paternal Aunt     Social History Social History   Tobacco Use  . Smoking status: Former Smoker    Packs/day: 2.00    Years: 40.00    Pack years: 80.00    Types: Cigarettes    Last attempt to quit: 05/29/1999    Years since quitting: 18.1  . Smokeless tobacco: Never Used  . Tobacco comment: 02/25/2013 "quit smoking in 2001"  Substance Use Topics  . Alcohol use: No  . Drug use: No     Allergies   Patient has no known allergies.   Review of Systems Review of Systems  Respiratory: Positive for cough and shortness of breath.   All other systems reviewed and are negative.    Physical Exam Updated Vital Signs BP (!) 145/63   Pulse 78   Temp 98.5 F (36.9 C) (Oral)   Resp (!) 28   Wt 77.6 kg (171 lb)   SpO2 93%   BMI 32.31 kg/m   Physical Exam  Constitutional: She is oriented to person, place, and time. She appears well-developed and well-nourished.  HENT:  Head: Normocephalic and atraumatic.  Cardiovascular: Normal rate, regular rhythm and normal heart sounds.  No murmur heard. Pulmonary/Chest: She is in respiratory distress. She has wheezes.  Breath sounds diminished on the right.   Abdominal: Soft. She exhibits no distension. There is no tenderness.  Musculoskeletal: She  exhibits no edema.  Neurological: She is alert and oriented to person, place, and time.  Skin: Skin is warm and dry.  Nursing note and vitals reviewed.    ED Treatments / Results  Labs (all labs ordered are listed, but only abnormal results are displayed) Labs Reviewed  CBC WITH DIFFERENTIAL/PLATELET - Abnormal; Notable for the following components:      Result Value   WBC 17.7 (*)    RBC 3.76 (*)    Hemoglobin 9.8 (*)  HCT 32.9 (*)    MCHC 29.8 (*)    RDW 16.0 (*)    Neutro Abs 14.8 (*)    All other components within normal limits  BASIC METABOLIC PANEL - Abnormal; Notable for the following components:   CO2 21 (*)    Glucose, Bld 285 (*)    Calcium 8.5 (*)    Anion gap 16 (*)    All other components within normal limits  BRAIN NATRIURETIC PEPTIDE - Abnormal; Notable for the following components:   B Natriuretic Peptide 520.3 (*)    All other components within normal limits  I-STAT VENOUS BLOOD GAS, ED - Abnormal; Notable for the following components:   pH, Ven 7.171 (*)    pCO2, Ven 69.8 (*)    pO2, Ven 26.0 (*)    Acid-base deficit 4.0 (*)    All other components within normal limits  I-STAT VENOUS BLOOD GAS, ED - Abnormal; Notable for the following components:   pCO2, Ven 42.9 (*)    pO2, Ven 49.0 (*)    All other components within normal limits  I-STAT CG4 LACTIC ACID, ED - Abnormal; Notable for the following components:   Lactic Acid, Venous 2.74 (*)    All other components within normal limits  INFLUENZA PANEL BY PCR (TYPE A & B)  I-STAT TROPONIN, ED    EKG  EKG Interpretation  Date/Time:  Tuesday July 23 2017 07:40:01 EDT Ventricular Rate:  91 PR Interval:    QRS Duration: 135 QT Interval:  376 QTC Calculation: 463 R Axis:   -91 Text Interpretation:  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  Confirmed by Zenovia Jarred [phone]) on 07/23/2017 8:09:59 AM       Radiology Dg Chest Portable 1 View  Result Date: 07/23/2017 CLINICAL DATA:  Shortness of  Breath EXAM: PORTABLE CHEST 1 VIEW COMPARISON:  Chest radiograph May 21, 2017 and chest CT June 14, 2017 FINDINGS: There is airspace consolidation throughout much the right lung with the greatest degree of concentration of consolidation in the right mid to lower lung regions. Left lung is clear. Heart is mildly enlarged with pulmonary vascularity within normal limits. Patient is status post aortic valve replacement. Pacemaker lead tips are attached to the right atrium and right ventricle. Patient is status post coronary artery bypass grafting. No adenopathy. No bone lesions. IMPRESSION: Extensive consolidation throughout much of the right lung consistent with multifocal pneumonia. Left lung clear. Stable cardiac prominence. Postoperative changes as noted. Electronically Signed   By: Lowella Grip III M.D.   On: 07/23/2017 08:09    Procedures Procedures (including critical care time)  CRITICAL CARE Performed by: Ozella Almond Shawne Bulow  Total critical care time: 45 minutes  Critical care time was exclusive of separately billable procedures and treating other patients.  Critical care was necessary to treat or prevent imminent or life-threatening deterioration.  Critical care was time spent personally by me on the following activities: development of treatment plan with patient and/or surrogate as well as nursing, discussions with consultants, evaluation of patient's response to treatment, examination of patient, obtaining history from patient or surrogate, ordering and performing treatments and interventions, ordering and review of laboratory studies, ordering and review of radiographic studies, pulse oximetry and re-evaluation of patient's condition.  Medications Ordered in ED Medications  vancomycin (VANCOCIN) IVPB 750 mg/150 ml premix (750 mg Intravenous New Bag/Given 07/23/17 0913)  piperacillin-tazobactam (ZOSYN) IVPB 3.375 g (not administered)  sodium chloride 0.9 % bolus 500 mL (not  administered)  methylPREDNISolone  sodium succinate (SOLU-MEDROL) 125 mg/2 mL injection 125 mg (125 mg Intravenous Given 07/23/17 0803)  nitroGLYCERIN 50 mg in dextrose 5 % 250 mL (0.2 mg/mL) infusion (0 mcg/min Intravenous Stopped 07/23/17 1010)  piperacillin-tazobactam (ZOSYN) IVPB 3.375 g (0 g Intravenous Stopped 07/23/17 0909)     Initial Impression / Assessment and Plan / ED Course  I have reviewed the triage vital signs and the nursing notes.  Pertinent labs & imaging results that were available during my care of the patient were reviewed by me and considered in my medical decision making (see chart for details).    Kyomi Hector is a 72 y.o. female who presents to ED for shortness of breath. On Bipap upon arrival. Given persistent hypoxemia, will keep on Bipap in ER for now. Solu-medrol given. Hypertensive with increased work of breathing, nitro given. Will obtain labs, CXR and continue to monitor closely.   CXR with extensive consolidation throughout most of the right lung c/w multifocal PNA. Leukocytosis of 17.7. VBG with ph of 7.17 and CO2 of 69.8.   8:18 AM - Patient re-evaluated and updated on plan. Still on bipap feels as if breathing is improved. BP normalizing.   Lactic 2.74; BNP 520.3. Given 500 cc fluids. Given patient with hx of chf and elevated BNP today, she is afebrile, not hypotensive or tachycardic with lactic <4, do not feel weight-based sepsis fluids is needed.   Patient transitioned from bipap to nasal cannula and tolerating well.   Hospitalist consulted who will admit.   Patient seen by and discussed with Dr. Thomasene Lot who agrees with treatment plan.   Final Clinical Impressions(s) / ED Diagnoses   Final diagnoses:  Acute hypoxemic respiratory failure [facility])    ED Discharge Orders    None       Yosselin Zoeller, Ozella Almond, PA-C 07/23/17 1015    Macarthur Critchley, MD 07/24/17 1420    [place], Fredia Sorrow, MD 07/24/17 1430

## 2017-07-23 NOTE — Progress Notes (Signed)
RT note: Trial patient of BIPAP per  MD request. Placed patient on 2L nasal canul. Vital signs stable.

## 2017-07-24 ENCOUNTER — Encounter (HOSPITAL_COMMUNITY): Payer: Self-pay | Admitting: General Practice

## 2017-07-24 ENCOUNTER — Inpatient Hospital Stay (HOSPITAL_COMMUNITY): Payer: Medicare Other

## 2017-07-24 DIAGNOSIS — R0902 Hypoxemia: Secondary | ICD-10-CM

## 2017-07-24 DIAGNOSIS — J181 Lobar pneumonia, unspecified organism: Secondary | ICD-10-CM

## 2017-07-24 DIAGNOSIS — J449 Chronic obstructive pulmonary disease, unspecified: Secondary | ICD-10-CM

## 2017-07-24 DIAGNOSIS — J90 Pleural effusion, not elsewhere classified: Secondary | ICD-10-CM

## 2017-07-24 LAB — BASIC METABOLIC PANEL
ANION GAP: 11 (ref 5–15)
BUN: 14 mg/dL (ref 6–20)
CO2: 24 mmol/L (ref 22–32)
Calcium: 8.4 mg/dL — ABNORMAL LOW (ref 8.9–10.3)
Chloride: 104 mmol/L (ref 101–111)
Creatinine, Ser: 0.73 mg/dL (ref 0.44–1.00)
GFR calc Af Amer: >60 mL/min (ref >60)
GLUCOSE: 212 mg/dL — AB (ref 65–99)
POTASSIUM: 3.4 mmol/L — AB (ref 3.5–5.1)
Sodium: 139 mmol/L (ref 135–145)

## 2017-07-24 LAB — CBC
HEMATOCRIT: 28.2 % — AB (ref 36.0–46.0)
HEMOGLOBIN: 9 g/dL — AB (ref 12.0–15.0)
MCH: 26.8 pg (ref 26.0–34.0)
MCHC: 31.9 g/dL (ref 30.0–36.0)
MCV: 83.9 fL (ref 78.0–100.0)
Platelets: 241 10*3/uL (ref 150–400)
RBC: 3.36 MIL/uL — ABNORMAL LOW (ref 3.87–5.11)
RDW: 16.3 % — AB (ref 11.5–15.5)
WBC: 14.7 10*3/uL — ABNORMAL HIGH (ref 4.0–10.5)

## 2017-07-24 LAB — LEGIONELLA PNEUMOPHILA SEROGP 1 UR AG: L. pneumophila Serogp 1 Ur Ag: NEGATIVE

## 2017-07-24 LAB — GLUCOSE, CAPILLARY
Glucose-Capillary: 190 mg/dL — ABNORMAL HIGH (ref 65–99)
Glucose-Capillary: 221 mg/dL — ABNORMAL HIGH (ref 65–99)
Glucose-Capillary: 246 mg/dL — ABNORMAL HIGH (ref 65–99)
Glucose-Capillary: 260 mg/dL — ABNORMAL HIGH (ref 65–99)

## 2017-07-24 LAB — PROCALCITONIN: PROCALCITONIN: 1.33 ng/mL

## 2017-07-24 MED ORDER — IPRATROPIUM-ALBUTEROL 0.5-2.5 (3) MG/3ML IN SOLN
3.0000 mL | Freq: Three times a day (TID) | RESPIRATORY_TRACT | Status: DC
  Administered 2017-07-25 – 2017-07-26 (×4): 3 mL via RESPIRATORY_TRACT
  Filled 2017-07-24 (×4): qty 3

## 2017-07-24 MED ORDER — BUDESONIDE 0.5 MG/2ML IN SUSP
0.5000 mg | Freq: Two times a day (BID) | RESPIRATORY_TRACT | Status: DC
  Administered 2017-07-24 – 2017-07-26 (×5): 0.5 mg via RESPIRATORY_TRACT
  Filled 2017-07-24 (×5): qty 2

## 2017-07-24 MED ORDER — IPRATROPIUM-ALBUTEROL 0.5-2.5 (3) MG/3ML IN SOLN
3.0000 mL | Freq: Four times a day (QID) | RESPIRATORY_TRACT | Status: DC
  Administered 2017-07-24 (×2): 3 mL via RESPIRATORY_TRACT
  Filled 2017-07-24 (×2): qty 3

## 2017-07-24 MED ORDER — POTASSIUM CHLORIDE CRYS ER 20 MEQ PO TBCR
20.0000 meq | EXTENDED_RELEASE_TABLET | Freq: Once | ORAL | Status: AC
  Administered 2017-07-24: 20 meq via ORAL
  Filled 2017-07-24: qty 1

## 2017-07-24 MED ORDER — IPRATROPIUM-ALBUTEROL 0.5-2.5 (3) MG/3ML IN SOLN: 3.0000 mL | Freq: Three times a day (TID) | RESPIRATORY_TRACT | Status: DC

## 2017-07-24 NOTE — Progress Notes (Signed)
PROGRESS NOTE    Tonya Glass  FBP:[id number] DOB: 1946-01-17 DOA: 07/23/2017 PCP: Tonya Rasmussen, MD    Brief Narrative by Tonya Bongo MD; : Tonya Glass is a 72 y.o. female with a Past Medical History of DM; CVA; pacemaker placement; hypothyroidism; HTN; HLD; depression; and COPD not on home O2 who presents with SOB since January with acute worsening the day of admission. She was placed on BIPAP via EMS and this was continued in the ER until she was eventually transitioned to  O2 successfully.  CXR shows diffuse right-sided multifocal PNA.     Assessment & Plan:   Principal Problem:   Acute respiratory failure with hypoxia (HCC) Active Problems:   Pacemaker   Essential hypertension   Dyslipidemia   S/P aortic valve replacement with bioprosthetic valve   DM2 (diabetes mellitus, type 2) (HCC)   Chronic heart failure (HCC)   H/O aortic root repair   CAD (coronary artery disease)   Sick sinus syndrome (HCC)   Hypothyroidism   Pneumonia  1-Acute on chronic hypoxic, hypercapnic, respiratory Failure; related to PNA and COPD exacerbation.  Chest x ray 3-12; extensive consolidation throughout of right lung consistent with multifocal PNA.  Strep Pneumonia negative. Influenza negative.  Blood culture.  Lactic acid; 2.1 Continue with ceftriaxone and azithromycin.  Reviewed prior records, had CT chest in January which had small loculated effusion. Pulmonary consulted for further evaluation.  Follow leukocytosis.   2-Chronic Diastolic HF;  Continue with BB.   Hypokalemia;  Replete orally.   Anemia; follow hb.   Hypothyroidism Continue with synthroid.   Type II DM; SSI.   HTN; Continue with ibersartan.   History of valvular disease/S/P PMP. AVR  Aspirin, Lipitor.   Anxiety, depression;  Continue with xanax prn.  Continue with Wellbutrin.  On klonopin.     DVT prophylaxis: Lovenox.  Code Status: DNR Family Communication: care discussed with patient.    Disposition Plan: to be determine.  Consultants:   CCM    Procedures: none   Antimicrobials: ceftriaxone and azithromycin. 3-12   Subjective: She report improvement SOB, but not at baseline. She doesn't used home oxygen. She report SOB progressively since January, worse lately. denies fever. Report some cough. No weight loss.   Objective: Vitals:   07/23/17 1903 07/23/17 2057 07/23/17 2104 07/24/17 0522  BP: (!) 156/50 (!) 148/51  (!) 155/47  Pulse: 71 69  70  Resp: 20 18  18   Temp: 98.4 F (36.9 C) 98.1 F (36.7 C)  97.7 F (36.5 C)  TempSrc: Oral Oral  Oral  SpO2: 100% 100% 98% 100%  Weight:      Height:        Intake/Output Summary (Last 24 hours) at 07/24/2017 0752 Last data filed at 07/24/2017 0522 Gross per 24 hour  Intake 500 ml  Output 400 ml  Net 100 ml   Filed Weights   07/23/17 0821 07/23/17 1308  Weight: 77.6 kg (171 lb) 77.6 kg (171 lb)    Examination:  General exam: Appears calm and comfortable  Respiratory system:Respiratory effort normal. Crackles right side.  Cardiovascular system: S1 & S2 heard, RRR. No JVD, murmurs, rubs, gallops or clicks. No pedal edema. Gastrointestinal system: Abdomen is nondistended, soft and nontender. No organomegaly or masses felt. Normal bowel sounds heard. Central nervous system: Alert and oriented. No focal neurological deficits. Extremities: Symmetric 5 x 5 power. Skin: No rashes, lesions or ulcers Psychiatry: Judgement and insight appear normal. Mood & affect appropriate.  Data Reviewed: I have personally reviewed following labs and imaging studies  CBC: Recent Labs  Lab 07/23/17 0751 07/24/17 0515  WBC 17.7* 14.7*  NEUTROABS 14.8*  --   HGB 9.8* 9.0*  HCT 32.9* 28.2*  MCV 87.5 83.9  PLT 380 622   Basic Metabolic Panel: Recent Labs  Lab 07/23/17 0751 07/24/17 0515  NA 140 139  K 4.0 3.4*  CL 103 104  CO2 21* 24  GLUCOSE 285* 212*  BUN 18 14  CREATININE 0.93 0.73  CALCIUM 8.5* 8.4*    GFR: Estimated Creatinine Clearance: 59.4 mL/min (by C-G formula based on SCr of 0.73 mg/dL). Liver Function Tests: No results for input(s): AST, ALT, ALKPHOS, BILITOT, PROT, ALBUMIN in the last 168 hours. No results for input(s): LIPASE, AMYLASE in the last 168 hours. No results for input(s): AMMONIA in the last 168 hours. Coagulation Profile: No results for input(s): INR, PROTIME in the last 168 hours. Cardiac Enzymes: No results for input(s): CKTOTAL, CKMB, CKMBINDEX, TROPONINI in the last 168 hours. BNP (last 3 results) Recent Labs    08/06/16 1255  PROBNP 2,468*   HbA1C: Recent Labs    07/23/17 1247  HGBA1C 5.6   CBG: Recent Labs  Lab 07/23/17 1248 07/23/17 1753 07/23/17 2055 07/24/17 0733  GLUCAP 190* 226* 227* 190*   Lipid Profile: No results for input(s): CHOL, HDL, LDLCALC, TRIG, CHOLHDL, LDLDIRECT in the last 72 hours. Thyroid Function Tests: No results for input(s): TSH, T4TOTAL, FREET4, T3FREE, THYROIDAB in the last 72 hours. Anemia Panel: No results for input(s): VITAMINB12, FOLATE, FERRITIN, TIBC, IRON, RETICCTPCT in the last 72 hours. Sepsis Labs: Recent Labs  Lab 07/23/17 0939 07/23/17 1247 07/23/17 2020  PROCALCITON  --  0.98  --   LATICACIDVEN 2.74* 1.6 2.1*    No results found for this or any previous visit (from the past 240 hour(s)).       Radiology Studies: Dg Chest Portable 1 View  Result Date: 07/23/2017 CLINICAL DATA:  Shortness of Breath EXAM: PORTABLE CHEST 1 VIEW COMPARISON:  Chest radiograph May 21, 2017 and chest CT June 14, 2017 FINDINGS: There is airspace consolidation throughout much the right lung with the greatest degree of concentration of consolidation in the right mid to lower lung regions. Left lung is clear. Heart is mildly enlarged with pulmonary vascularity within normal limits. Patient is status post aortic valve replacement. Pacemaker lead tips are attached to the right atrium and right ventricle. Patient is  status post coronary artery bypass grafting. No adenopathy. No bone lesions. IMPRESSION: Extensive consolidation throughout much of the right lung consistent with multifocal pneumonia. Left lung clear. Stable cardiac prominence. Postoperative changes as noted. Electronically Signed   By: Lowella Grip III M.D.   On: 07/23/2017 08:09        Scheduled Meds: . aspirin  325 mg Oral Daily  . atorvastatin  40 mg Oral Daily  . buPROPion  150 mg Oral Daily  . carvedilol  12.5 mg Oral BID  . clonazePAM  1 mg Oral QHS  . enoxaparin (LOVENOX) injection  40 mg Subcutaneous Q24H  . FLUoxetine  20 mg Oral Daily  . insulin aspart  0-9 Units Subcutaneous TID WC  . ipratropium-albuterol  3 mL Nebulization QID  . irbesartan  150 mg Oral Daily  . levothyroxine  50 mcg Oral QAC breakfast  . methylPREDNISolone (SOLU-MEDROL) injection  60 mg Intravenous Q12H   Continuous Infusions: . azithromycin    . cefTRIAXone (ROCEPHIN)  IV    .  lactated ringers 75 mL/hr at 07/24/17 0108     LOS: 1 day    Time spent: 35 minutes.     Elmarie Shiley, MD [facility] Pager [phone]  If 7PM-7AM, please contact night-coverage [email or web address] Password [facility] 07/24/2017, 7:52 AM

## 2017-07-24 NOTE — Consult Note (Addendum)
Name: Tonya Glass MRN: [id number] DOB: 12/25/1945    ADMISSION DATE:  07/23/2017 CONSULTATION DATE:  07/24/2017  REFERRING MD :  Dr. Tyrell Antonio  CHIEF COMPLAINT:  SOB  HISTORY OF PRESENT ILLNESS:   72 yoF with PMH as below, significant for but not limited to former smoker, COPD, diastolic HF, previous AVR and pacemaker, DM, HTN, CVA and DNR, who presented with progressive dyspnea she states over the last months, worse for the last 3 days, having to use her inhaler more frequently, and intermittent chills x several weeks. Additionally reports she slid out of her bed last week falling to the floor on her left side but denies injuries.  She lives alone and states she is independent in her ADLs.  Denies productive cough, dysphagia, recent exposure to sick contacts, sore throat, cp, n/v/d, myalgias, or orthopnea.  On admit, she was found to be hypoxic with increased work of breathing and placed on NIPPV, treated with nebs and solumedrol with improvement.  CXR with exentensive consolidation in R lung, afebrile, WBC 17, BNP 520, initial lactic 2.74.  Initially given vancomycin and zosyn then changed to azithro and ceftriaxone.  She was admitted to [facility].  Since admission, she has remained hemodynamically stable and currently on nasal cannula at 3L.   Pulmonary consulted for extensive right consolidation of right lung with concern for  possible loculation.  PAST MEDICAL HISTORY :   has a past medical history of Anxiety, Aortic valve stenosis (10/10/2016), Arthritis, COPD (chronic obstructive pulmonary disease) ([place]), Depression, Gallstones, Headache(784.0), Heart murmur, High cholesterol, Hypertension, Hypothyroidism, Migraines, Orthopnoea, Pacemaker, Stroke ([place]) (2001), Symptomatic hypotension (10/10/2016), Syncope and collapse, Type II diabetes mellitus ([place]), and Walking pneumonia (~ 2012).  has a past surgical history that includes Cholecystectomy (1971); Insert / replace / remove pacemaker (01/19/2013);  Cardiac valve replacement (01/26/2013); and Laparoscopic gastric sleeve resection (2009). Prior to Admission medications   Medication Sig Start Date End Date Taking? Authorizing Provider  albuterol (PROVENTIL HFA;VENTOLIN HFA) 108 (90 BASE) MCG/ACT inhaler Inhale 2 puffs into the lungs. Use as directed -  With exercise 12/08/13  Yes [provider]  alendronate (FOSAMAX) 70 MG tablet Take 70 mg by mouth every 7 (seven) days. SUNDAYS 07/13/17  Yes [provider]  aspirin 325 MG tablet Take 325 mg by mouth daily.   Yes [provider]  atorvastatin (LIPITOR) 40 MG tablet Take 40 mg by mouth daily.   Yes [provider]  buPROPion (WELLBUTRIN XL) 150 MG 24 hr tablet Take 150 mg by mouth daily.   Yes [provider]  carvedilol (COREG) 12.5 MG tablet Take 1 tablet (12.5 mg total) 2 (two) times daily by mouth. 03/22/17  Yes Croitoru, Mihai, MD  cloNIDine (CATAPRES) 0.1 MG tablet Take 0.1 mg by mouth at bedtime.  07/16/17  Yes [provider]  FLUoxetine (PROZAC) 20 MG capsule Take 20 mg by mouth daily.   Yes [provider]  furosemide (LASIX) 20 MG tablet Take 20 mg every other day 10/26/16  Yes Hilty, Nadean Corwin, MD  hydrocortisone 2.5 % cream Apply 1 application topically daily. 07/15/17  Yes [provider]  levothyroxine (SYNTHROID, LEVOTHROID) 50 MCG tablet Take 50 mcg by mouth daily before breakfast.   Yes [provider]  metFORMIN (GLUCOPHAGE) 500 MG tablet Take 2 tablets by mouth 2 (two) times daily. 09/04/15  Yes [provider]  olmesartan (BENICAR) 20 MG tablet Take 0.5 tablets (10 mg total) by mouth daily. 05/24/17  Yes Lyman Bishop  C, MD  temazepam (RESTORIL) 30 MG capsule Take 30 mg by mouth at bedtime.  07/07/17  Yes [provider]  Tiotropium Bromide-Olodaterol (STIOLTO RESPIMAT) 2.5-2.5 MCG/ACT AERS Inhale 2 puffs into the lungs daily.   Yes [provider]  traMADol (ULTRAM) 50 MG tablet  Take 50 mg by mouth as needed for moderate pain.  07/23/13  Yes [provider]   No Known Allergies  FAMILY HISTORY:  family history includes Breast cancer in her maternal aunt; Colon polyps in her brother and daughter; Diabetes in her maternal aunt and paternal aunt; Heart Problems in her father; Hypertension in her brother; Lung cancer in her mother; Skin cancer in her brother. SOCIAL HISTORY:  reports that she quit smoking about 18 years ago. Her smoking use included cigarettes. She has a 80.00 pack-year smoking history. she has never used smokeless tobacco. She reports that she does not drink alcohol or use drugs.  REVIEW OF SYSTEMS:  POSITIVES IN BOLD Constitutional: Negative for fever, chills, weight loss, malaise/fatigue   HENT: Negative for  congestion, sore throat, neck pain, Respiratory: Negative for cough, hemoptysis, sputum production, shortness of breath, wheezing  Cardiovascular: Negative for chest pain, palpitations, orthopnea, leg swelling Gastrointestinal: Negative for nausea, vomiting, abdominal pain, diarrhea, constipation, blood in stool Genitourinary: Negative for dysuria, hematuria   Musculoskeletal: Negative for myalgias and falls.  Skin: Negative for itching and rash.  Neurological: Negative for dizziness, sensory change, speech change, focal weakness,  loss of consciousness, weakness and headaches.  Endo/Heme/Allergies: Negative for environmental allergies. Does not bruise/bleed easily.  SUBJECTIVE:  Currently denies any SOB or pain.  VITAL SIGNS: Temp:  [97.7 F (36.5 C)-98.4 F (36.9 C)] 98.2 F (36.8 C) (03/13 0959) Pulse Rate:  [69-81] 70 (03/13 0959) Resp:  [18-25] 18 (03/13 0959) BP: (138-178)/(47-75) 138/50 (03/13 0959) SpO2:  [98 %-100 %] 100 % (03/13 0959) Weight:  [171 lb (77.6 kg)] 171 lb (77.6 kg) (03/12 1308)  PHYSICAL EXAMINATION: General:  Pleasant elderly WF sitting in bedside recliner in no distress HEENT: MM pink/moist, Pupils  4/= Neuro: alert, oriented, non focal  CV: rrr, + Murmur 2ICS/ RSB PULM: even/non-labored, R lung with diffuse rales/rhonchi, left clear anteriorly, slight rales at base, no wheeze GI: soft, non-tender, bsx4 active  Extremities: warm/dry, +1 BLE edema  Skin: no rashes  Recent Labs  Lab 07/23/17 0751 07/24/17 0515  NA 140 139  K 4.0 3.4*  CL 103 104  CO2 21* 24  BUN 18 14  CREATININE 0.93 0.73  GLUCOSE 285* 212*   Recent Labs  Lab 07/23/17 0751 07/24/17 0515  HGB 9.8* 9.0*  HCT 32.9* 28.2*  WBC 17.7* 14.7*  PLT 380 241   Dg Chest Portable 1 View  Result Date: 07/23/2017 CLINICAL DATA:  Shortness of Breath EXAM: PORTABLE CHEST 1 VIEW COMPARISON:  Chest radiograph May 21, 2017 and chest CT June 14, 2017 FINDINGS: There is airspace consolidation throughout much the right lung with the greatest degree of concentration of consolidation in the right mid to lower lung regions. Left lung is clear. Heart is mildly enlarged with pulmonary vascularity within normal limits. Patient is status post aortic valve replacement. Pacemaker lead tips are attached to the right atrium and right ventricle. Patient is status post coronary artery bypass grafting. No adenopathy. No bone lesions. IMPRESSION: Extensive consolidation throughout much of the right lung consistent with multifocal pneumonia. Left lung clear. Stable cardiac prominence. Postoperative changes as noted. Electronically Signed   By: Lowella Grip III  M.D.   On: 07/23/2017 08:09   STUDIES:  CXR 3/12 >> Extensive consolidation throughout much of the right lung consistent with multifocal pneumonia. Left lung clear. Stable cardiac prominence  CT chest >>  ASSESSMENT / PLAN:  Acute hypoxic respiratory failure in the setting of extensive right lung consolidation  Hx COPD- doubt acute exacerbation - urine strep pna neg DNR/DNI P: Wean supplemental O2 as needed CT chest without contrast to assess for loculation/ pleural  effusion Continue empiric CAP coverage  Urine legionella pending Assess PCT  Trend WBC/ fever curve If able, send sputum cx increase BD to duonebs q 6, albuterol prn and add budesonide BID D/c Solumedrol IV Continue mucinex Bronchial hygiene  Consider SLP consult to rule out chronic aspiration/ depending CT read [facility] IVF   Remainder per primary team  PCCM will continue to follow  Kennieth Rad, AGACNP-BC [place] Pgr: [phone] or if no answer [phone] 07/24/2017, 10:50 AM  Attending Note:  72 year old female with history of COPD who presents with SOB.  CXR was performed that I reviewed myself that showed concern for loculated pleural effusion.  On exam, dullness to percussion on the right and decreased BS on the right.  Discussed with PCCM-NP.   Pleural effusion:  - Chest CT without contrast to delineate effusion vs infiltrate  - If effusion without loculation will need sampling.  - Will review in the afternoon and decide on thora  CAP:  - Rocephin/zithromax  - F/u on cultures  Hypoxemia:  - Titrate O2 for sat of 88-92%.  COPD:  - PRN albuterol  - Duonebs  - Budesonide  - No need for systemic steroids at this time  PCCM will continue to follow.  Patient seen and examined, agree with above note.  I dictated the care and orders written for this patient under my direction.  Rush Farmer, [place]

## 2017-07-24 NOTE — Plan of Care (Signed)
  Education: Knowledge of General Education information will improve 07/24/2017 0504 - Progressing by Anson Fret, RN Note POC and orders reviewed with pt.

## 2017-07-25 DIAGNOSIS — J9601 Acute respiratory failure with hypoxia: Secondary | ICD-10-CM

## 2017-07-25 LAB — BASIC METABOLIC PANEL
Anion gap: 11 (ref 5–15)
BUN: 22 mg/dL — ABNORMAL HIGH (ref 6–20)
CHLORIDE: 105 mmol/L (ref 101–111)
CO2: 23 mmol/L (ref 22–32)
CREATININE: 0.78 mg/dL (ref 0.44–1.00)
Calcium: 8.1 mg/dL — ABNORMAL LOW (ref 8.9–10.3)
Glucose, Bld: 242 mg/dL — ABNORMAL HIGH (ref 65–99)
Potassium: 3.4 mmol/L — ABNORMAL LOW (ref 3.5–5.1)
SODIUM: 139 mmol/L (ref 135–145)

## 2017-07-25 LAB — URINE CULTURE: Culture: 80000 — AB

## 2017-07-25 LAB — CBC
HCT: 28.8 % — ABNORMAL LOW (ref 36.0–46.0)
HEMOGLOBIN: 8.8 g/dL — AB (ref 12.0–15.0)
MCH: 25.6 pg — ABNORMAL LOW (ref 26.0–34.0)
MCHC: 30.6 g/dL (ref 30.0–36.0)
MCV: 83.7 fL (ref 78.0–100.0)
PLATELETS: 244 10*3/uL (ref 150–400)
RBC: 3.44 MIL/uL — ABNORMAL LOW (ref 3.87–5.11)
RDW: 16.1 % — AB (ref 11.5–15.5)
WBC: 13.4 10*3/uL — ABNORMAL HIGH (ref 4.0–10.5)

## 2017-07-25 LAB — GLUCOSE, CAPILLARY
GLUCOSE-CAPILLARY: 197 mg/dL — AB (ref 65–99)
Glucose-Capillary: 212 mg/dL — ABNORMAL HIGH (ref 65–99)
Glucose-Capillary: 208 mg/dL — ABNORMAL HIGH (ref 65–99)
Glucose-Capillary: 167 mg/dL — ABNORMAL HIGH (ref 65–99)

## 2017-07-25 MED ORDER — FUROSEMIDE 20 MG PO TABS
20.0000 mg | ORAL_TABLET | ORAL | Status: DC
  Administered 2017-07-25: 20 mg via ORAL
  Filled 2017-07-25 (×2): qty 1

## 2017-07-25 NOTE — Progress Notes (Addendum)
PROGRESS NOTE    Tonya Glass  RSW:[id number] DOB: [birth year] DOA: 07/23/2017 PCP: Hayden Rasmussen, MD    Brief Narrative by Karmen Bongo MD; : Tonya Glass is a 72 y.o. female with a Past Medical History of DM; CVA; pacemaker placement; hypothyroidism; HTN; HLD; depression; and COPD not on home O2 who presents with SOB since January with acute worsening the day of admission. She was placed on BIPAP via EMS and this was continued in the ER until she was eventually transitioned to [place] O2 successfully.  CXR shows diffuse right-sided multifocal PNA.     Assessment & Plan:   Principal Problem:   Acute respiratory failure with hypoxia (HCC) Active Problems:   Pacemaker   Essential hypertension   Dyslipidemia   S/P aortic valve replacement with bioprosthetic valve   DM2 (diabetes mellitus, type 2) (HCC)   Chronic heart failure (HCC)   H/O aortic root repair   CAD (coronary artery disease)   Sick sinus syndrome (HCC)   Hypothyroidism   Pneumonia  1-Acute on chronic hypoxic, hypercapnic, respiratory Failure; related to PNA and COPD exacerbation.  Chest x ray 3-12; extensive consolidation throughout of right lung consistent with multifocal PNA.  Strep Pneumonia negative. Influenza negative.  Blood culture. No gorwth Lactic acid; 2.1 Continue with ceftriaxone and azithromycin.  Reviewed prior records, had CT chest in January which had small loculated effusion. Pulmonary consulted for further evaluation.  WBC trending down.  Plan is to repeat chest x ray to follow up on effusion. If worse, will need IR, CVTS evaluation.   2-Chronic Diastolic HF;  Continue with BB.   Hypokalemia;  Replete orally.   Anemia; follow hb.  Check anemia panel.   Hypothyroidism Continue with synthroid.   Type II DM; SSI.   HTN; Continue with ibersartan.   History of valvular disease/S/P PMP. AVR  Continue with Aspirin, Lipitor.   Anxiety, depression;  Continue with xanax prn.  Continue  with Wellbutrin.  On klonopin.   Urine culture grew E coli, ESBL; denies dysuria.   hold on treatment.    DVT prophylaxis: Lovenox.  Code Status: DNR Family Communication: care discussed with patient.  Disposition Plan: to be determine.  Consultants:   CCM    Procedures: none   Antimicrobials: ceftriaxone and azithromycin. 3-12   Subjective: She is feeling better, dyspnea improved.   Objective: Vitals:   07/25/17 0721 07/25/17 0723 07/25/17 1021 07/25/17 1300  BP:   (!) 151/52   Pulse:   73   Resp:   18   Temp:   98.2 F (36.8 C)   TempSrc:   Oral   SpO2: 100% 100% 100%   Weight:    85 kg (187 lb 4.8 oz)  Height:        Intake/Output Summary (Last 24 hours) at 07/25/2017 1533 Last data filed at 07/25/2017 1503 Gross per 24 hour  Intake 930 ml  Output 1325 ml  Net -395 ml   Filed Weights   07/23/17 1308 07/24/17 2041 07/25/17 1300  Weight: 77.6 kg (171 lb) 85.6 kg (188 lb 12.8 oz) 85 kg (187 lb 4.8 oz)    Examination:  General exam: NAD Respiratory system: respiratory effort normal, crackles bases.  Cardiovascular system:  S 1, S 2 RRR Gastrointestinal system: BS present, soft, nt Central nervous system: non focal.  Extremities: symmetric power.  Skin: no rash Psychiatry: mood and affect appropriate.    Data Reviewed: I have personally reviewed following labs and imaging studies  CBC:  Recent Labs  Lab 07/23/17 0751 07/24/17 0515 07/25/17 0638  WBC 17.7* 14.7* 13.4*  NEUTROABS 14.8*  --   --   HGB 9.8* 9.0* 8.8*  HCT 32.9* 28.2* 28.8*  MCV 87.5 83.9 83.7  PLT 380 241 160   Basic Metabolic Panel: Recent Labs  Lab 07/23/17 0751 07/24/17 0515 07/25/17 0638  NA 140 139 139  K 4.0 3.4* 3.4*  CL 103 104 105  CO2 21* 24 23  GLUCOSE 285* 212* 242*  BUN 18 14 22*  CREATININE 0.93 0.73 0.78  CALCIUM 8.5* 8.4* 8.1*   GFR: Estimated Creatinine Clearance: 62.4 mL/min (by C-G formula based on SCr of 0.78 mg/dL). Liver Function Tests: No  results for input(s): AST, ALT, ALKPHOS, BILITOT, PROT, ALBUMIN in the last 168 hours. No results for input(s): LIPASE, AMYLASE in the last 168 hours. No results for input(s): AMMONIA in the last 168 hours. Coagulation Profile: No results for input(s): INR, PROTIME in the last 168 hours. Cardiac Enzymes: No results for input(s): CKTOTAL, CKMB, CKMBINDEX, TROPONINI in the last 168 hours. BNP (last 3 results) Recent Labs    08/06/16 1255  PROBNP 2,468*   HbA1C: Recent Labs    07/23/17 1247  HGBA1C 5.6   CBG: Recent Labs  Lab 07/24/17 1131 07/24/17 1637 07/24/17 2046 07/25/17 0754 07/25/17 1207  GLUCAP 221* 246* 260* 212* 208*   Lipid Profile: No results for input(s): CHOL, HDL, LDLCALC, TRIG, CHOLHDL, LDLDIRECT in the last 72 hours. Thyroid Function Tests: No results for input(s): TSH, T4TOTAL, FREET4, T3FREE, THYROIDAB in the last 72 hours. Anemia Panel: No results for input(s): VITAMINB12, FOLATE, FERRITIN, TIBC, IRON, RETICCTPCT in the last 72 hours. Sepsis Labs: Recent Labs  Lab 07/23/17 0939 07/23/17 1247 07/23/17 2020 07/24/17 0515  PROCALCITON  --  0.98  --  1.33  LATICACIDVEN 2.74* 1.6 2.1*  --     Recent Results (from the past 240 hour(s))  Urine culture     Status: Abnormal   Collection Time: 07/23/17 10:35 AM  Result Value Ref Range Status   Specimen Description URINE, CLEAN CATCH  Final   Special Requests NONE  Final   Culture (A)  Final    80,000 COLONIES/mL ESCHERICHIA COLI Confirmed Extended Spectrum Beta-Lactamase Producer (ESBL).  In bloodstream infections from ESBL organisms, carbapenems are preferred over piperacillin/tazobactam. They are shown to have a lower risk of mortality. Performed at [facility], [place] [address] [place], [place] [address]    Report Status 07/25/2017 FINAL  Final   Organism ID, Bacteria ESCHERICHIA COLI (A)  Final      Susceptibility   Escherichia coli - MIC*    AMPICILLIN >=32 RESISTANT Resistant      CEFAZOLIN >=64 RESISTANT Resistant     CEFTRIAXONE >=64 RESISTANT Resistant     CIPROFLOXACIN >=4 RESISTANT Resistant     GENTAMICIN <=1 SENSITIVE Sensitive     IMIPENEM <=0.25 SENSITIVE Sensitive     NITROFURANTOIN <=16 SENSITIVE Sensitive     TRIMETH/SULFA <=20 SENSITIVE Sensitive     AMPICILLIN/SULBACTAM >=32 RESISTANT Resistant     PIP/TAZO 64 INTERMEDIATE Intermediate     Extended ESBL POSITIVE Resistant     * 80,000 COLONIES/mL ESCHERICHIA COLI  Culture, blood (routine x 2) Call MD if unable to obtain prior to antibiotics being given     Status: None (Preliminary result)   Collection Time: 07/23/17  8:15 PM  Result Value Ref Range Status   Specimen Description BLOOD LEFT ARM  Final  Special Requests   Final    BOTTLES DRAWN AEROBIC ONLY Blood Culture results may not be optimal due to an inadequate volume of blood received in culture bottles   Culture   Final    NO GROWTH < 24 HOURS Performed at [place] [address], [place], [place] [address]    Report Status PENDING  Incomplete  Culture, blood (routine x 2) Call MD if unable to obtain prior to antibiotics being given     Status: None (Preliminary result)   Collection Time: 07/23/17  8:20 PM  Result Value Ref Range Status   Specimen Description BLOOD LEFT HAND  Final   Special Requests   Final    BOTTLES DRAWN AEROBIC AND ANAEROBIC Blood Culture adequate volume   Culture   Final    NO GROWTH < 24 HOURS Performed at [facility], [address] [place], [place] [address]    Report Status PENDING  Incomplete         Radiology Studies: Ct Chest Wo Contrast  Result Date: 07/24/2017 CLINICAL DATA:  Shortness of breath, pneumonia. EXAM: CT CHEST WITHOUT CONTRAST TECHNIQUE: Multidetector CT imaging of the chest was performed following the standard protocol without IV contrast. COMPARISON:  CT scan of June 14, 2017 and February 26, 2013. Radiograph of July 23, 2017. FINDINGS: Cardiovascular:  Atherosclerosis of thoracic aorta is noted. Status post aortic valve replacement. 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 Left-sided pacemaker is seen in grossly good position. Mediastinum/Nodes: Thyroid gland is unremarkable. Small sliding-type hiatal hernia is noted. Stable mediastinal adenopathy is noted which most likely is reactive and benign in etiology. Lungs/Pleura: No pneumothorax is noted. Left lung is clear. Interval development of multifocal airspace opacities throughout the right lung most consistent with pneumonia. Moderate right lower lobe opacity is noted posteriorly consistent with atelectasis or pneumonia. Moderate loculated right pleural effusion is noted. Upper Abdomen: No acute abnormality. Musculoskeletal: No chest wall mass or suspicious bone lesions identified. IMPRESSION: Multifocal airspace opacities are noted throughout the right lung most consistent with multifocal pneumonia. Moderate right posterior basilar opacity is noted consistent with atelectasis or pneumonia, with associated moderate loculated right pleural effusion. Possibility of empyema cannot be excluded. Small sliding-type hiatal hernia. Stable mediastinal adenopathy is noted which most likely is reactive and benign in etiology. 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 Recommend semi-annual imaging followup by CTA or MRA and referral to cardiothoracic surgery if not already obtained. This recommendation follows 2010 ACCF/AHA/AATS/ACR/ASA/SCA/SCAI/SIR/STS/SVM Guidelines for the Diagnosis and Management of Patients With Thoracic Aortic Disease. Circulation. 2010; 121: e266-e36. Aortic Atherosclerosis (ICD10-I70.0). Electronically Signed   By: Marijo Conception, M.D.   On: 07/24/2017 13:06        Scheduled Meds: . aspirin  325 mg Oral Daily  . atorvastatin  40 mg Oral Daily  . budesonide (PULMICORT) nebulizer solution  0.5 mg Nebulization  BID  . buPROPion  150 mg Oral Daily  . carvedilol  12.5 mg Oral BID  . clonazePAM  1 mg Oral QHS  . enoxaparin (LOVENOX) injection  40 mg Subcutaneous Q24H  . FLUoxetine  20 mg Oral Daily  . furosemide  20 mg Oral QODAY  . insulin aspart  0-9 Units Subcutaneous TID WC  . ipratropium-albuterol  3 mL Nebulization TID  . irbesartan  150 mg Oral Daily  . levothyroxine  50 mcg Oral QAC breakfast   Continuous Infusions: . azithromycin Stopped (  07/25/17 1400)  . cefTRIAXone (ROCEPHIN)  IV 1 g (07/25/17 1503)     LOS: 2 days    Time spent: 35 minutes.     Elmarie Shiley, MD [facility] Pager [phone]  If 7PM-7AM, please contact night-coverage [email or web address] Password [facility] 07/25/2017, 3:33 PM

## 2017-07-25 NOTE — Care Management Note (Addendum)
Case Management Note  Patient Details  Name: Tonya Glass MRN: [id number] Date of Birth: 1945/06/07  Subjective/Objective:   History of COPD, diastolic HF, previous AVR and pacemaker; admitted for Acute hypoxic respiratory failure. Lives alone and states she is independent in her ADLs.   Action/Plan: CXR shows diffuse right-sided multifocal PNA. Titrate O2 for sat of 88-92%. IV ceftriaxone and azithromycin.    Expected Discharge Date:     To Be determined.             Expected Discharge Plan:  Home/Self Care  In-House Referral:  ( SLP consult )  Discharge planning Services   CM Consult  Status of Service:  In process, will continue to follow  Additional Comments: PCP noted.  Kristen Cardinal, RN  Nurse case manager [place] [phone] 07/25/2017, 12:35 PM

## 2017-07-25 NOTE — Progress Notes (Signed)
Name: Tonya Glass MRN: [id number] DOB: 20-Apr-1946    ADMISSION DATE:  07/23/2017 CONSULTATION DATE:  07/24/2017  REFERRING MD :  Dr. Tyrell Antonio  CHIEF COMPLAINT:  SOB  HISTORY OF PRESENT ILLNESS:   72 yo female smoker with dyspnea for several months and chills.  Found to have hypoxia and Rt lung consolidation.  Found to have small loculated Rt pleural effusion.  PMHx of Anxiety, COPD, Depression, HLD, HTN, Hypothyroidism, s/p PM, CVA, DM, Aortic stenosis.  SUBJECTIVE:  Breathing better.  No chest pain.  Cough decreased.  Able to walk in room w/o dyspnea.  VITAL SIGNS: BP (!) 151/52 (BP Location: Left Arm)   Pulse 73   Temp 98.2 F (36.8 C) (Oral)   Resp 18   Ht 5' (1.524 m)   Wt 188 lb 12.8 oz (85.6 kg)   SpO2 100%   BMI 36.87 kg/m   INTAKE/OUTPUT: I/O last 3 completed shifts: In: 520 [P.O.:420; IV Piggyback:100] Out: 1950 [Urine:1950]  PHYSICAL EXAMINATION:  General - pleasant Eyes - pupils reactive ENT - no sinus tenderness, no oral exudate, no LAN Cardiac - regular, no murmur Chest - deceased BS rt base with faint rales Abd - soft, non tender Ext - no edema Skin - no rashes Neuro - normal strength Psych - normal mood  CMP Latest Ref Rng & Units 07/25/2017 07/24/2017 07/23/2017  Glucose 65 - 99 mg/dL 242(H) 212(H) 285(H)  BUN 6 - 20 mg/dL 22(H) 14 18  Creatinine 0.44 - 1.00 mg/dL 0.78 0.73 0.93  Sodium 135 - 145 mmol/L 139 139 140  Potassium 3.5 - 5.1 mmol/L 3.4(L) 3.4(L) 4.0  Chloride 101 - 111 mmol/L 105 104 103  CO2 22 - 32 mmol/L 23 24 21(L)  Calcium 8.9 - 10.3 mg/dL 8.1(L) 8.4(L) 8.5(L)  Total Protein 6.0 - 8.3 g/dL - - -  Total Bilirubin 0.3 - 1.2 mg/dL - - -  Alkaline Phos 39 - 117 U/L - - -  AST 0 - 37 U/L - - -  ALT 0 - 35 U/L - - -   CBC Latest Ref Rng & Units 07/25/2017 07/24/2017 07/23/2017  WBC 4.0 - 10.5 K/uL 13.4(H) 14.7(H) 17.7(H)  Hemoglobin 12.0 - 15.0 g/dL 8.8(L) 9.0(L) 9.8(L)  Hematocrit 36.0 - 46.0 % 28.8(L) 28.2(L) 32.9(L)    Platelets 150 - 400 K/uL 244 241 380   Ct Chest Wo Contrast  Result Date: 07/24/2017 CLINICAL DATA:  Shortness of breath, pneumonia. EXAM: CT CHEST WITHOUT CONTRAST TECHNIQUE: Multidetector CT imaging of the chest was performed following the standard protocol without IV contrast. COMPARISON:  CT scan of June 14, 2017 and February 26, 2013. Radiograph of July 23, 2017. FINDINGS: Cardiovascular: Atherosclerosis of thoracic aorta is noted. Status post aortic valve replacement. 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 Left-sided pacemaker is seen in grossly good position. Mediastinum/Nodes: Thyroid gland is unremarkable. Small sliding-type hiatal hernia is noted. Stable mediastinal adenopathy is noted which most likely is reactive and benign in etiology. Lungs/Pleura: No pneumothorax is noted. Left lung is clear. Interval development of multifocal airspace opacities throughout the right lung most consistent with pneumonia. Moderate right lower lobe opacity is noted posteriorly consistent with atelectasis or pneumonia. Moderate loculated right pleural effusion is noted. Upper Abdomen: No acute abnormality. Musculoskeletal: No chest wall mass or suspicious bone lesions identified. IMPRESSION: Multifocal airspace opacities are noted throughout the right lung most consistent with multifocal pneumonia. Moderate right posterior basilar opacity is noted consistent with atelectasis  or pneumonia, with associated moderate loculated right pleural effusion. Possibility of empyema cannot be excluded. Small sliding-type hiatal hernia. Stable mediastinal adenopathy is noted which most likely is reactive and benign in etiology. 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 Recommend semi-annual imaging followup by CTA or MRA and referral to cardiothoracic surgery if not already obtained. This recommendation follows 2010  ACCF/AHA/AATS/ACR/ASA/SCA/SCAI/SIR/STS/SVM Guidelines for the Diagnosis and Management of Patients With Thoracic Aortic Disease. Circulation. 2010; 121: e266-e36. Aortic Atherosclerosis (ICD10-I70.0). Electronically Signed   By: Marijo Conception, M.D.   On: 07/24/2017 13:06     STUDIES:  CT chest 3/13 >> 4.1 cm descending thoracic aorta, multifocal pneumonia, small loculated Rt effusion  CULTURES: Blood 3/12 >> Urine 3/12 >> E coli Influenza PCR 3/12 >> negative Legionella Ag 3/12 >> negative Pneumococcal Ag 3/12 >> negative  ANTIBIOTICS: Vancomycin 3/12 >> Zosyn 3/12 >> Zithromax 3/12 >> Rocephin 3/13 >>   DISCUSSION: Clinically improving.  Effusion seems small on CT chest.  Will f/u CXR.  If stable, then would just continue ABx.  If effusion is progressing, would then need thoracic surgery assessment and possible thoracentesis by IR.  ASSESSMENT / PLAN:  Acute hypoxic respiratory failure 2nd to pneumonia with small loculated Rt pleural effusion. - ABx per primary team - f/u CXR - if effusion progresses, then consider IR to tap or thoracic surgery assessment - oxygen to keep SpO2 > 90%  Hx of COPD. - scheduled BDs  Goals of care. - DNR/DNI  Chesley Mires, MD [place] 07/25/2017, 12:50 PM Pager:  [phone] After 3pm call: [phone]

## 2017-07-25 NOTE — Progress Notes (Addendum)
Notified MD Regalado that pt had 8lb weight gain from 07/23/17 to 07/24/17. Pt reports taking lasix 20 mg every other day ( on her PTA meds). Awaiting orders.   1450 notified MD that urine of pt was positive for ESBL.Marland Kitchen

## 2017-07-26 ENCOUNTER — Inpatient Hospital Stay (HOSPITAL_COMMUNITY): Payer: Medicare Other

## 2017-07-26 LAB — GLUCOSE, CAPILLARY
GLUCOSE-CAPILLARY: 127 mg/dL — AB (ref 65–99)
GLUCOSE-CAPILLARY: 130 mg/dL — AB (ref 65–99)
GLUCOSE-CAPILLARY: 156 mg/dL — AB (ref 65–99)

## 2017-07-26 MED ORDER — AMOXICILLIN-POT CLAVULANATE 875-125 MG PO TABS: 1.0000 | ORAL_TABLET | Freq: Two times a day (BID) | ORAL | 0 refills | Status: AC

## 2017-07-26 MED ORDER — SODIUM CHLORIDE 0.9 % IV SOLN
1.0000 g | INTRAVENOUS | Status: DC
  Administered 2017-07-26: 1 g via INTRAVENOUS
  Filled 2017-07-26: qty 10

## 2017-07-26 MED ORDER — CLONAZEPAM 1 MG PO TABS: 1.0000 mg | ORAL_TABLET | Freq: Every day | ORAL | 0 refills | Status: DC

## 2017-07-26 MED ORDER — ALPRAZOLAM 0.25 MG PO TABS: 0.2500 mg | ORAL_TABLET | Freq: Every day | ORAL | 0 refills | Status: DC | PRN

## 2017-07-26 MED ORDER — POTASSIUM CHLORIDE CRYS ER 20 MEQ PO TBCR
40.0000 meq | EXTENDED_RELEASE_TABLET | Freq: Once | ORAL | Status: AC
Start: 2017-07-26 — End: 2017-07-26
  Administered 2017-07-26: 40 meq via ORAL
  Filled 2017-07-26: qty 2

## 2017-07-26 MED ORDER — GUAIFENESIN ER 600 MG PO TB12: 600.0000 mg | ORAL_TABLET | Freq: Two times a day (BID) | ORAL | 0 refills | Status: DC | PRN

## 2017-07-26 NOTE — Progress Notes (Signed)
Name: Tonya Glass MRN: [id number] DOB: August 16, 1945    ADMISSION DATE:  07/23/2017 CONSULTATION DATE:  07/24/2017  REFERRING MD :  Dr. Tyrell Antonio  CHIEF COMPLAINT:  SOB  HISTORY OF PRESENT ILLNESS:   72 yo female smoker with dyspnea for several months and chills.  Found to have hypoxia and Rt lung consolidation.  Found to have small loculated Rt pleural effusion.  PMHx of Anxiety, COPD, Depression, HLD, HTN, Hypothyroidism, s/p PM, CVA, DM, Aortic stenosis.  SUBJECTIVE:  Has trouble breathing while asleep when laying flat.  Better when she sits up and sleeps.  She snores some, and her brother has OSA.  Otherwise feels much better.  VITAL SIGNS: BP (!) 167/68 (BP Location: Right Arm)   Pulse 73   Temp 98 F (36.7 C) (Oral)   Resp 16   Ht 5' (1.524 m)   Wt 187 lb 4.8 oz (85 kg)   SpO2 96%   BMI 36.58 kg/m   INTAKE/OUTPUT: I/O last 3 completed shifts: In: 1770 [P.O.:1520; IV Piggyback:250] Out: 1250 [Urine:1250]  PHYSICAL EXAMINATION:  General - pleasant Eyes - pupils reactive ENT - no sinus tenderness, no oral exudate, no LAN Cardiac - regular, no murmur Chest - no wheeze, faint rales Rt base Abd - soft, non tender Ext - no edema Skin - no rashes Neuro - normal strength Psych - normal mood   CMP Latest Ref Rng & Units 07/25/2017 07/24/2017 07/23/2017  Glucose 65 - 99 mg/dL 242(H) 212(H) 285(H)  BUN 6 - 20 mg/dL 22(H) 14 18  Creatinine 0.44 - 1.00 mg/dL 0.78 0.73 0.93  Sodium 135 - 145 mmol/L 139 139 140  Potassium 3.5 - 5.1 mmol/L 3.4(L) 3.4(L) 4.0  Chloride 101 - 111 mmol/L 105 104 103  CO2 22 - 32 mmol/L 23 24 21(L)  Calcium 8.9 - 10.3 mg/dL 8.1(L) 8.4(L) 8.5(L)  Total Protein 6.0 - 8.3 g/dL - - -  Total Bilirubin 0.3 - 1.2 mg/dL - - -  Alkaline Phos 39 - 117 U/L - - -  AST 0 - 37 U/L - - -  ALT 0 - 35 U/L - - -   CBC Latest Ref Rng & Units 07/25/2017 07/24/2017 07/23/2017  WBC 4.0 - 10.5 K/uL 13.4(H) 14.7(H) 17.7(H)  Hemoglobin 12.0 - 15.0 g/dL 8.8(L) 9.0(L)  9.8(L)  Hematocrit 36.0 - 46.0 % 28.8(L) 28.2(L) 32.9(L)  Platelets 150 - 400 K/uL 244 241 380   Dg Chest 2 View  Result Date: 07/26/2017 CLINICAL DATA:  Cough and shortness of breath. EXAM: CHEST - 2 VIEW COMPARISON:  CT chest dated July 24, 2017. Chest x-ray dated July 23, 2017. FINDINGS: Stable borderline cardiomegaly status post aortic valve replacement. Unchanged left chest wall pacemaker. Normal pulmonary vascularity. Improved consolidation in the right lung. Persistent loculated pleural effusion on the right, slightly decreased in size. Bibasilar atelectasis. No pneumothorax. No acute osseous abnormality. IMPRESSION: 1. Improved consolidation in the right lung. 2. Persistent loculated pleural effusion on the right, slightly decreased in size. Electronically Signed   By: Titus Dubin M.D.   On: 07/26/2017 09:57   Ct Chest Wo Contrast  Result Date: 07/24/2017 CLINICAL DATA:  Shortness of breath, pneumonia. EXAM: CT CHEST WITHOUT CONTRAST TECHNIQUE: Multidetector CT imaging of the chest was performed following the standard protocol without IV contrast. COMPARISON:  CT scan of June 14, 2017 and February 26, 2013. Radiograph of July 23, 2017. FINDINGS: Cardiovascular: Atherosclerosis of thoracic aorta is noted. Status post aortic valve replacement. Focal fusiform aneurysm of  proximal descending thoracic aorta is noted measuring 4.1 cm. Left-sided pacemaker is seen in grossly good position. Mediastinum/Nodes: Thyroid gland is unremarkable. Small sliding-type hiatal hernia is noted. Stable mediastinal adenopathy is noted which most likely is reactive and benign in etiology. Lungs/Pleura: No pneumothorax is noted. Left lung is clear. Interval development of multifocal airspace opacities throughout the right lung most consistent with pneumonia. Moderate right lower lobe opacity is noted posteriorly consistent with atelectasis or pneumonia. Moderate loculated right pleural effusion is noted. Upper  Abdomen: No acute abnormality. Musculoskeletal: No chest wall mass or suspicious bone lesions identified. IMPRESSION: Multifocal airspace opacities are noted throughout the right lung most consistent with multifocal pneumonia. Moderate right posterior basilar opacity is noted consistent with atelectasis or pneumonia, with associated moderate loculated right pleural effusion. Possibility of empyema cannot be excluded. Small sliding-type hiatal hernia. Stable mediastinal adenopathy is noted which most likely is reactive and benign in etiology. 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 Recommend semi-annual imaging followup by CTA or MRA and referral to cardiothoracic surgery if not already obtained. This recommendation follows 2010 ACCF/AHA/AATS/ACR/ASA/SCA/SCAI/SIR/STS/SVM Guidelines for the Diagnosis and Management of Patients With Thoracic Aortic Disease. Circulation. 2010; 121: e266-e36. Aortic Atherosclerosis (ICD10-I70.0). Electronically Signed   By: Marijo Conception, M.D.   On: 07/24/2017 13:06     STUDIES:  CT chest 3/13 >> 4.1 cm descending thoracic aorta, multifocal pneumonia, small loculated Rt effusion  CULTURES: Blood 3/12 >> Urine 3/12 >> E coli, ESBL Influenza PCR 3/12 >> negative Legionella Ag 3/12 >> negative Pneumococcal Ag 3/12 >> negative  ANTIBIOTICS: Vancomycin 3/12 >> 3/12 Zosyn 3/12 >> 3/12 Zithromax 3/12 >> Rocephin 3/13 >>   DISCUSSION: Clinically improved, and CXR looks better.  No indication for thoracentesis, chest tube placement, or surgical assessment at this point.  ASSESSMENT / PLAN:  Community acquired pneumonia with small loculated Rt pleural effusion. - ABx per primary team - she can have f/u CXR as outpt with PCP  Hx of COPD. - continue BDs  Goals of care. - DNR/DNI  PCCM will s/o.  Please call if additional help needed while she is in hospital.   Chesley Mires, MD [place] 07/26/2017,  12:04 PM Pager:  [phone] After 3pm call: [phone]

## 2017-07-26 NOTE — Progress Notes (Signed)
Got call from phlebotomist that blood specimen taken for CBC is not enough and need to redraw, notified the patient but patient refused the lab and wanted to to be released, NOtified Dr. Tyrell Antonio.

## 2017-07-26 NOTE — Discharge Summary (Signed)
Physician Discharge Summary  Tonya Glass LOV:[id number] DOB: January 08, 1946 DOA: 07/23/2017  PCP: Tonya Rasmussen, MD  Admit date: 07/23/2017 Discharge date: 07/26/2017  Admitted From: Home  Disposition:  Home   Recommendations for Outpatient Follow-up:  1. Follow up with PCP in 1-2 weeks 2. Please obtain BMP/CBC in one week 3. Needs repeat chest x ray to follow on pleural effusion.   Home Health: no  Discharge Condition: stable.  CODE STATUS: dnr Diet recommendation: Heart Healthy  Brief/Interim Summary:  Brief Narrative by Karmen Bongo MD; : [name] a 72 y.o.femalewith a Past Medical History of DM; CVA; pacemaker placement; hypothyroidism; HTN; HLD; depression; and COPD not on home O2 who presents with SOB since January with acute worsening the day of admission. She was placed on BIPAP via EMS and this was continued in the ER until she was eventually transitioned to [place] O2 successfully. CXR shows diffuse right-sided multifocal PNA.    Assessment & Plan:   Principal Problem:   Acute respiratory failure with hypoxia (HCC) Active Problems:   Pacemaker   Essential hypertension   Dyslipidemia   S/P aortic valve replacement with bioprosthetic valve   DM2 (diabetes mellitus, type 2) (HCC)   Chronic heart failure (HCC)   H/O aortic root repair   CAD (coronary artery disease)   Sick sinus syndrome (HCC)   Hypothyroidism   Pneumonia  1-Acute on chronic hypoxic, hypercapnic, respiratory Failure; related to PNA and COPD exacerbation.  Chest x ray 3-12; extensive consolidation throughout of right lung consistent with multifocal PNA.  Strep Pneumonia negative. Influenza negative.  Blood culture. No gorwth Lactic acid; 2.1 Received ceftriaxone and azithromycin for 4 days.   Reviewed prior records, had CT chest in January which had small loculated effusion. Pulmonary consulted for further evaluation.  WBC trending down.  No labs available today.  She is feeling  much better, dyspnea much improved. Cough improved. Chest x ray with decrease effusion. She has received 4 days of IV antibiotics. Unable to get labs today. She will be discharge on augmentin for 10 more days. Needs follow chest x ray. Appreciate Dr Halford Chessman help.   2-Chronic Diastolic HF;  Continue with BB.   Hypokalemia;  Replete orally.   Anemia; follow hb.  Refuse labs earlier. Also she is difficult stick.   Hypothyroidism Continue with synthroid.   Type II DM; SSI.   HTN; Continue with ibersartan.   History of valvular disease/S/P PMP. AVR  Continue with Aspirin, Lipitor.   Anxiety, depression;  Continue with xanax prn.  Continue with Wellbutrin.  On klonopin.   Urine culture grew E coli, ESBL; denies dysuria.   hold on treatment.     Discharge Diagnoses:  Principal Problem:   Acute respiratory failure with hypoxia (HCC) Active Problems:   Pacemaker   Essential hypertension   Dyslipidemia   S/P aortic valve replacement with bioprosthetic valve   DM2 (diabetes mellitus, type 2) (HCC)   Chronic heart failure (HCC)   H/O aortic root repair   CAD (coronary artery disease)   Sick sinus syndrome ([place])   Hypothyroidism   Pneumonia    Discharge Instructions   Allergies as of 07/26/2017   No Known Allergies     Medication List    TAKE these medications   albuterol 108 (90 Base) MCG/ACT inhaler Commonly known as:  PROVENTIL HFA;VENTOLIN HFA Inhale 2 puffs into the lungs. Use as directed -  With exercise   alendronate 70 MG tablet Commonly known as:  FOSAMAX Take  70 mg by mouth every 7 (seven) days. SUNDAYS   ALPRAZolam 0.25 MG tablet Commonly known as:  XANAX Take 1 tablet (0.25 mg total) by mouth daily as needed for anxiety. What changed:    when to take this  reasons to take this   amoxicillin-clavulanate 875-125 MG tablet Commonly known as:  AUGMENTIN Take 1 tablet by mouth 2 (two) times daily for 10 days.   aspirin 325 MG  tablet Take 325 mg by mouth daily.   atorvastatin 40 MG tablet Commonly known as:  LIPITOR Take 40 mg by mouth daily.   buPROPion 150 MG 24 hr tablet Commonly known as:  WELLBUTRIN XL Take 150 mg by mouth daily.   carvedilol 12.5 MG tablet Commonly known as:  COREG Take 1 tablet (12.5 mg total) 2 (two) times daily by mouth.   clonazePAM 1 MG tablet Commonly known as:  KLONOPIN Take 1 tablet (1 mg total) by mouth at bedtime. What changed:  See the new instructions.   cloNIDine 0.1 MG tablet Commonly known as:  CATAPRES Take 0.1 mg by mouth at bedtime.   FLUoxetine 20 MG capsule Commonly known as:  PROZAC Take 20 mg by mouth daily.   furosemide 20 MG tablet Commonly known as:  LASIX Take 20 mg every other day   guaiFENesin 600 MG 12 hr tablet Commonly known as:  MUCINEX Take 1 tablet (600 mg total) by mouth 2 (two) times daily as needed for to loosen phlegm.   hydrocortisone 2.5 % cream Apply 1 application topically daily.   levothyroxine 50 MCG tablet Commonly known as:  SYNTHROID, LEVOTHROID Take 50 mcg by mouth daily before breakfast.   metFORMIN 500 MG tablet Commonly known as:  GLUCOPHAGE Take 2 tablets by mouth 2 (two) times daily.   olmesartan 20 MG tablet Commonly known as:  BENICAR Take 0.5 tablets (10 mg total) by mouth daily.   STIOLTO RESPIMAT 2.5-2.5 MCG/ACT Aers Generic drug:  Tiotropium Bromide-Olodaterol Inhale 2 puffs into the lungs daily.   temazepam 30 MG capsule Commonly known as:  RESTORIL Take 30 mg by mouth at bedtime.   traMADol 50 MG tablet Commonly known as:  ULTRAM Take 50 mg by mouth as needed for moderate pain.      Follow-up Information    Tonya Rasmussen, MD. Call.   Specialty:  Family Medicine Contact information: [place] [address] [phone]          No Known Allergies  Consultations:  Pulmonologist    Procedures/Studies: Dg Chest 2 View  Result Date:  07/26/2017 CLINICAL DATA:  Cough and shortness of breath. EXAM: CHEST - 2 VIEW COMPARISON:  CT chest dated July 24, 2017. Chest x-ray dated July 23, 2017. FINDINGS: Stable borderline cardiomegaly status post aortic valve replacement. Unchanged left chest wall pacemaker. Normal pulmonary vascularity. Improved consolidation in the right lung. Persistent loculated pleural effusion on the right, slightly decreased in size. Bibasilar atelectasis. No pneumothorax. No acute osseous abnormality. IMPRESSION: 1. Improved consolidation in the right lung. 2. Persistent loculated pleural effusion on the right, slightly decreased in size. Electronically Signed   By: Titus Dubin M.D.   On: 07/26/2017 09:57   Ct Chest Wo Contrast  Result Date: 07/24/2017 CLINICAL DATA:  Shortness of breath, pneumonia. EXAM: CT CHEST WITHOUT CONTRAST TECHNIQUE: Multidetector CT imaging of the chest was performed following the standard protocol without IV contrast. COMPARISON:  CT scan of June 14, 2017 and February 26, 2013. Radiograph of  July 23, 2017. FINDINGS: Cardiovascular: Atherosclerosis of thoracic aorta is noted. Status post aortic valve replacement. 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 Left-sided pacemaker is seen in grossly good position. Mediastinum/Nodes: Thyroid gland is unremarkable. Small sliding-type hiatal hernia is noted. Stable mediastinal adenopathy is noted which most likely is reactive and benign in etiology. Lungs/Pleura: No pneumothorax is noted. Left lung is clear. Interval development of multifocal airspace opacities throughout the right lung most consistent with pneumonia. Moderate right lower lobe opacity is noted posteriorly consistent with atelectasis or pneumonia. Moderate loculated right pleural effusion is noted. Upper Abdomen: No acute abnormality. Musculoskeletal: No chest wall mass or suspicious bone lesions identified. IMPRESSION: Multifocal  airspace opacities are noted throughout the right lung most consistent with multifocal pneumonia. Moderate right posterior basilar opacity is noted consistent with atelectasis or pneumonia, with associated moderate loculated right pleural effusion. Possibility of empyema cannot be excluded. Small sliding-type hiatal hernia. Stable mediastinal adenopathy is noted which most likely is reactive and benign in etiology. 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 Recommend semi-annual imaging followup by CTA or MRA and referral to cardiothoracic surgery if not already obtained. This recommendation follows 2010 ACCF/AHA/AATS/ACR/ASA/SCA/SCAI/SIR/STS/SVM Guidelines for the Diagnosis and Management of Patients With Thoracic Aortic Disease. Circulation. 2010; 121: e266-e36. Aortic Atherosclerosis (ICD10-I70.0). Electronically Signed   By: Marijo Conception, M.D.   On: 07/24/2017 13:06   Dg Chest Portable 1 View  Result Date: 07/23/2017 CLINICAL DATA:  Shortness of Breath EXAM: PORTABLE CHEST 1 VIEW COMPARISON:  Chest radiograph May 21, 2017 and chest CT June 14, 2017 FINDINGS: There is airspace consolidation throughout much the right lung with the greatest degree of concentration of consolidation in the right mid to lower lung regions. Left lung is clear. Heart is mildly enlarged with pulmonary vascularity within normal limits. Patient is status post aortic valve replacement. Pacemaker lead tips are attached to the right atrium and right ventricle. Patient is status post coronary artery bypass grafting. No adenopathy. No bone lesions. IMPRESSION: Extensive consolidation throughout much of the right lung consistent with multifocal pneumonia. Left lung clear. Stable cardiac prominence. Postoperative changes as noted. Electronically Signed   By: Lowella Grip III M.D.   On: 07/23/2017 08:09    (Echo, Carotid, EGD, Colonoscopy, ERCP)    Subjective:   Discharge Exam: Vitals:    07/26/17 0850 07/26/17 0918  BP: (!) 167/68   Pulse: 73   Resp: 16   Temp: 98 F (36.7 C)   SpO2: 98% 96%   Vitals:   07/25/17 2123 07/26/17 0720 07/26/17 0850 07/26/17 0918  BP: (!) 155/50 (!) 177/67 (!) 167/68   Pulse: 71 74 73   Resp: 16 16 16    Temp: 97.7 F (36.5 C) 97.7 F (36.5 C) 98 F (36.7 C)   TempSrc: Oral Oral Oral   SpO2: 95% 95% 98% 96%  Weight:      Height:        General: Pt is alert, awake, not in acute distress Cardiovascular: RRR, S1/S2 +, no rubs, no gallops Respiratory: CTA bilaterally, no wheezing, no rhonchi Abdominal: Soft, NT, ND, bowel sounds + Extremities: no edema, no cyanosis    The results of significant diagnostics from this hospitalization (including imaging, microbiology, ancillary and laboratory) are listed below for reference.     Microbiology: Recent Results (from the past 240 hour(s))  Urine culture     Status: Abnormal   Collection Time: 07/23/17 10:35 AM  Result Value Ref Range Status   Specimen Description URINE, CLEAN CATCH  Final   Special Requests NONE  Final   Culture (A)  Final    80,000 COLONIES/mL ESCHERICHIA COLI Confirmed Extended Spectrum Beta-Lactamase Producer (ESBL).  In bloodstream infections from ESBL organisms, carbapenems are preferred over piperacillin/tazobactam. They are shown to have a lower risk of mortality. Performed at [facility], [place] [address], [place], [place] [address]    Report Status 07/25/2017 FINAL  Final   Organism ID, Bacteria ESCHERICHIA COLI (A)  Final      Susceptibility   Escherichia coli - MIC*    AMPICILLIN >=32 RESISTANT Resistant     CEFAZOLIN >=64 RESISTANT Resistant     CEFTRIAXONE >=64 RESISTANT Resistant     CIPROFLOXACIN >=4 RESISTANT Resistant     GENTAMICIN <=1 SENSITIVE Sensitive     IMIPENEM <=0.25 SENSITIVE Sensitive     NITROFURANTOIN <=16 SENSITIVE Sensitive     TRIMETH/SULFA <=20 SENSITIVE Sensitive     AMPICILLIN/SULBACTAM >=32 RESISTANT Resistant      PIP/TAZO 64 INTERMEDIATE Intermediate     Extended ESBL POSITIVE Resistant     * 80,000 COLONIES/mL ESCHERICHIA COLI  Culture, blood (routine x 2) Call MD if unable to obtain prior to antibiotics being given     Status: None (Preliminary result)   Collection Time: 07/23/17  8:15 PM  Result Value Ref Range Status   Specimen Description BLOOD LEFT ARM  Final   Special Requests   Final    BOTTLES DRAWN AEROBIC ONLY Blood Culture results may not be optimal due to an inadequate volume of blood received in culture bottles   Culture   Final    NO GROWTH 3 DAYS Performed at [facility], [address], [place], [place] [address]    Report Status PENDING  Incomplete  Culture, blood (routine x 2) Call MD if unable to obtain prior to antibiotics being given     Status: None (Preliminary result)   Collection Time: 07/23/17  8:20 PM  Result Value Ref Range Status   Specimen Description BLOOD LEFT HAND  Final   Special Requests   Final    BOTTLES DRAWN AEROBIC AND ANAEROBIC Blood Culture adequate volume   Culture   Final    NO GROWTH 3 DAYS Performed at [facility], [place] [address], [place],  [address]    Report Status PENDING  Incomplete     Labs: BNP (last 3 results) Recent Labs    07/23/17 0751  BNP 073.7*   Basic Metabolic Panel: Recent Labs  Lab 07/23/17 0751 07/24/17 0515 07/25/17 0638  NA 140 139 139  K 4.0 3.4* 3.4*  CL 103 104 105  CO2 21* 24 23  GLUCOSE 285* 212* 242*  BUN 18 14 22*  CREATININE 0.93 0.73 0.78  CALCIUM 8.5* 8.4* 8.1*   Liver Function Tests: No results for input(s): AST, ALT, ALKPHOS, BILITOT, PROT, ALBUMIN in the last 168 hours. No results for input(s): LIPASE, AMYLASE in the last 168 hours. No results for input(s): AMMONIA in the last 168 hours. CBC: Recent Labs  Lab 07/23/17 0751 07/24/17 0515 07/25/17 0638  WBC 17.7* 14.7* 13.4*  NEUTROABS 14.8*  --   --   HGB 9.8* 9.0* 8.8*  HCT 32.9* 28.2* 28.8*  MCV 87.5 83.9 83.7   PLT 380 241 244   Cardiac Enzymes: No results for input(s): CKTOTAL, CKMB, CKMBINDEX, TROPONINI in the last 168 hours. BNP: Invalid input(s): POCBNP  CBG: Recent Labs  Lab 07/25/17 1207 07/25/17 1716 07/25/17 2118 07/26/17 0742 07/26/17 1125  GLUCAP 208* 167* 197* 127* 130*   D-Dimer No results for input(s): DDIMER in the last 72 hours. Hgb A1c No results for input(s): HGBA1C in the last 72 hours. Lipid Profile No results for input(s): CHOL, HDL, LDLCALC, TRIG, CHOLHDL, LDLDIRECT in the last 72 hours. Thyroid function studies No results for input(s): TSH, T4TOTAL, T3FREE, THYROIDAB in the last 72 hours.  Invalid input(s): FREET3 Anemia work up No results for input(s): VITAMINB12, FOLATE, FERRITIN, TIBC, IRON, RETICCTPCT in the last 72 hours. Urinalysis    Component Value Date/Time   COLORURINE YELLOW 07/23/2017 1036   APPEARANCEUR CLOUDY (A) 07/23/2017 1036   LABSPEC >1.030 (H) 07/23/2017 1036   PHURINE 5.5 07/23/2017 1036   GLUCOSEU NEGATIVE 07/23/2017 1036   HGBUR TRACE (A) 07/23/2017 1036   BILIRUBINUR NEGATIVE 07/23/2017 1036   KETONESUR NEGATIVE 07/23/2017 1036   PROTEINUR 100 (A) 07/23/2017 1036   NITRITE POSITIVE (A) 07/23/2017 1036   LEUKOCYTESUR NEGATIVE 07/23/2017 1036   Sepsis Labs Invalid input(s): PROCALCITONIN,  WBC,  LACTICIDVEN Microbiology Recent Results (from the past 240 hour(s))  Urine culture     Status: Abnormal   Collection Time: 07/23/17 10:35 AM  Result Value Ref Range Status   Specimen Description URINE, CLEAN CATCH  Final   Special Requests NONE  Final   Culture (A)  Final    80,000 COLONIES/mL ESCHERICHIA COLI Confirmed Extended Spectrum Beta-Lactamase Producer (ESBL).  In bloodstream infections from ESBL organisms, carbapenems are preferred over piperacillin/tazobactam. They are shown to have a lower risk of mortality. Performed at [facility], [place] [address] [place], [place] [address]    Report Status 07/25/2017 FINAL   Final   Organism ID, Bacteria ESCHERICHIA COLI (A)  Final      Susceptibility   Escherichia coli - MIC*    AMPICILLIN >=32 RESISTANT Resistant     CEFAZOLIN >=64 RESISTANT Resistant     CEFTRIAXONE >=64 RESISTANT Resistant     CIPROFLOXACIN >=4 RESISTANT Resistant     GENTAMICIN <=1 SENSITIVE Sensitive     IMIPENEM <=0.25 SENSITIVE Sensitive     NITROFURANTOIN <=16 SENSITIVE Sensitive     TRIMETH/SULFA <=20 SENSITIVE Sensitive     AMPICILLIN/SULBACTAM >=32 RESISTANT Resistant     PIP/TAZO 64 INTERMEDIATE Intermediate     Extended ESBL POSITIVE Resistant     * 80,000 COLONIES/mL ESCHERICHIA COLI  Culture, blood (routine x 2) Call MD if unable to obtain prior to antibiotics being given     Status: None (Preliminary result)   Collection Time: 07/23/17  8:15 PM  Result Value Ref Range Status   Specimen Description BLOOD LEFT ARM  Final   Special Requests   Final    BOTTLES DRAWN AEROBIC ONLY Blood Culture results may not be optimal due to an inadequate volume of blood received in culture bottles   Culture   Final    NO GROWTH 3 DAYS Performed at [facility], [address] [place], [place] [address]    Report Status PENDING  Incomplete  Culture, blood (routine x 2) Call MD if unable to obtain prior to antibiotics being given     Status: None (Preliminary result)   Collection Time: 07/23/17  8:20 PM  Result Value Ref Range Status   Specimen Description BLOOD LEFT HAND  Final   Special Requests   Final    BOTTLES DRAWN AEROBIC AND ANAEROBIC Blood Culture adequate volume  Culture   Final    NO GROWTH 3 DAYS Performed at [facility], [place] [address], [place], [place] [address]    Report Status PENDING  Incomplete     Time coordinating discharge: Over 30 minutes  SIGNED:   Elmarie Shiley, MD  [facility] 07/26/2017, 1:29 PM Pager   If 7PM-7AM, please contact night-coverage [email or web address] Password [email or web address]

## 2017-07-26 NOTE — Progress Notes (Signed)
Patient refused labs this morning.

## 2017-07-26 NOTE — Progress Notes (Signed)
Patient Discharge: Disposition: Patient discharged to home. Education: Reviewed medications, prescriptions, follow-up appointment and discharge instructions, verbalized understanding. IV: Discontinued IV before discharge. Telemetry: N/A. Transportation: Patient escorted out of the unit in w/c till the ride. Belongings: Patient took all her belongings with her.

## 2017-07-28 LAB — CULTURE, BLOOD (ROUTINE X 2)
Culture: NO GROWTH
Culture: NO GROWTH
SPECIAL REQUESTS: ADEQUATE

## 2017-08-18 ENCOUNTER — Emergency Department (HOSPITAL_COMMUNITY): Payer: Medicare Other

## 2017-08-18 ENCOUNTER — Inpatient Hospital Stay (HOSPITAL_COMMUNITY)
Admission: EM | Admit: 2017-08-18 | Discharge: 2017-08-20 | DRG: 291 | Disposition: A | Discharge status: Home / Self Care | Payer: Medicare Other | Attending: Internal Medicine | Admitting: Internal Medicine

## 2017-08-18 ENCOUNTER — Encounter (HOSPITAL_COMMUNITY): Payer: Self-pay | Admitting: Emergency Medicine

## 2017-08-18 DIAGNOSIS — I4510 Unspecified right bundle-branch block: Secondary | ICD-10-CM | POA: Diagnosis present

## 2017-08-18 DIAGNOSIS — I160 Hypertensive urgency: Secondary | ICD-10-CM | POA: Diagnosis present

## 2017-08-18 DIAGNOSIS — Z8673 Personal history of transient ischemic attack (TIA), and cerebral infarction without residual deficits: Secondary | ICD-10-CM | POA: Diagnosis not present

## 2017-08-18 DIAGNOSIS — Z950 Presence of cardiac pacemaker: Secondary | ICD-10-CM | POA: Diagnosis not present

## 2017-08-18 DIAGNOSIS — F329 Major depressive disorder, single episode, unspecified: Secondary | ICD-10-CM | POA: Diagnosis present

## 2017-08-18 DIAGNOSIS — D649 Anemia, unspecified: Secondary | ICD-10-CM | POA: Diagnosis not present

## 2017-08-18 DIAGNOSIS — T502X5A Adverse effect of carbonic-anhydrase inhibitors, benzothiadiazides and other diuretics, initial encounter: Secondary | ICD-10-CM | POA: Diagnosis not present

## 2017-08-18 DIAGNOSIS — I509 Heart failure, unspecified: Secondary | ICD-10-CM

## 2017-08-18 DIAGNOSIS — E039 Hypothyroidism, unspecified: Secondary | ICD-10-CM | POA: Diagnosis not present

## 2017-08-18 DIAGNOSIS — I10 Essential (primary) hypertension: Secondary | ICD-10-CM | POA: Diagnosis not present

## 2017-08-18 DIAGNOSIS — F419 Anxiety disorder, unspecified: Secondary | ICD-10-CM | POA: Diagnosis present

## 2017-08-18 DIAGNOSIS — I2510 Atherosclerotic heart disease of native coronary artery without angina pectoris: Secondary | ICD-10-CM | POA: Diagnosis not present

## 2017-08-18 DIAGNOSIS — Z953 Presence of xenogenic heart valve: Secondary | ICD-10-CM

## 2017-08-18 DIAGNOSIS — I272 Pulmonary hypertension, unspecified: Secondary | ICD-10-CM | POA: Diagnosis not present

## 2017-08-18 DIAGNOSIS — I5031 Acute diastolic (congestive) heart failure: Secondary | ICD-10-CM | POA: Diagnosis not present

## 2017-08-18 DIAGNOSIS — M19041 Primary osteoarthritis, right hand: Secondary | ICD-10-CM | POA: Diagnosis not present

## 2017-08-18 DIAGNOSIS — E7800 Pure hypercholesterolemia, unspecified: Secondary | ICD-10-CM | POA: Diagnosis not present

## 2017-08-18 DIAGNOSIS — I110 Hypertensive heart disease with heart failure: Secondary | ICD-10-CM | POA: Diagnosis present

## 2017-08-18 DIAGNOSIS — E876 Hypokalemia: Secondary | ICD-10-CM | POA: Diagnosis not present

## 2017-08-18 DIAGNOSIS — Z8249 Family history of ischemic heart disease and other diseases of the circulatory system: Secondary | ICD-10-CM

## 2017-08-18 DIAGNOSIS — Z87891 Personal history of nicotine dependence: Secondary | ICD-10-CM

## 2017-08-18 DIAGNOSIS — J449 Chronic obstructive pulmonary disease, unspecified: Secondary | ICD-10-CM | POA: Diagnosis not present

## 2017-08-18 DIAGNOSIS — Z79899 Other long term (current) drug therapy: Secondary | ICD-10-CM

## 2017-08-18 DIAGNOSIS — E119 Type 2 diabetes mellitus without complications: Secondary | ICD-10-CM | POA: Diagnosis present

## 2017-08-18 DIAGNOSIS — M19042 Primary osteoarthritis, left hand: Secondary | ICD-10-CM | POA: Diagnosis present

## 2017-08-18 DIAGNOSIS — I495 Sick sinus syndrome: Secondary | ICD-10-CM | POA: Diagnosis present

## 2017-08-18 DIAGNOSIS — Z951 Presence of aortocoronary bypass graft: Secondary | ICD-10-CM

## 2017-08-18 DIAGNOSIS — Z66 Do not resuscitate: Secondary | ICD-10-CM | POA: Diagnosis not present

## 2017-08-18 DIAGNOSIS — Z7982 Long term (current) use of aspirin: Secondary | ICD-10-CM

## 2017-08-18 DIAGNOSIS — Z9889 Other specified postprocedural states: Secondary | ICD-10-CM

## 2017-08-18 LAB — COMPREHENSIVE METABOLIC PANEL
ALBUMIN: 3.5 g/dL (ref 3.5–5.0)
ALK PHOS: 59 U/L (ref 38–126)
ALT: 16 U/L (ref 14–54)
AST: 23 U/L (ref 15–41)
Anion gap: 13 (ref 5–15)
BUN: 12 mg/dL (ref 6–20)
CALCIUM: 8.9 mg/dL (ref 8.9–10.3)
CO2: 26 mmol/L (ref 22–32)
Chloride: 103 mmol/L (ref 101–111)
Creatinine, Ser: 0.69 mg/dL (ref 0.44–1.00)
GFR calc non Af Amer: >60 mL/min (ref >60)
GLUCOSE: 108 mg/dL — AB (ref 65–99)
POTASSIUM: 2.8 mmol/L — AB (ref 3.5–5.1)
SODIUM: 142 mmol/L (ref 135–145)
Total Bilirubin: 1.3 mg/dL — ABNORMAL HIGH (ref 0.3–1.2)
Total Protein: 5.6 g/dL — ABNORMAL LOW (ref 6.5–8.1)

## 2017-08-18 LAB — CBC WITH DIFFERENTIAL/PLATELET
BASOS PCT: 0 %
Basophils Absolute: 0 10*3/uL (ref 0.0–0.1)
EOS ABS: 0.3 10*3/uL (ref 0.0–0.7)
EOS PCT: 5 %
HCT: 27 % — ABNORMAL LOW (ref 36.0–46.0)
HEMOGLOBIN: 8.4 g/dL — AB (ref 12.0–15.0)
LYMPHS ABS: 1.5 10*3/uL (ref 0.7–4.0)
Lymphocytes Relative: 26 %
MCH: 25.7 pg — AB (ref 26.0–34.0)
MCHC: 31.1 g/dL (ref 30.0–36.0)
MCV: 82.6 fL (ref 78.0–100.0)
MONO ABS: 0.4 10*3/uL (ref 0.1–1.0)
MONOS PCT: 7 %
Neutro Abs: 3.6 10*3/uL (ref 1.7–7.7)
Neutrophils Relative %: 62 %
PLATELETS: 236 10*3/uL (ref 150–400)
RBC: 3.27 MIL/uL — ABNORMAL LOW (ref 3.87–5.11)
RDW: 17.5 % — AB (ref 11.5–15.5)
WBC: 5.9 10*3/uL (ref 4.0–10.5)

## 2017-08-18 LAB — I-STAT CHEM 8, ED
BUN: 12 mg/dL (ref 6–20)
CHLORIDE: 101 mmol/L (ref 101–111)
CREATININE: 0.7 mg/dL (ref 0.44–1.00)
Calcium, Ion: 1.08 mmol/L — ABNORMAL LOW (ref 1.15–1.40)
Glucose, Bld: 110 mg/dL — ABNORMAL HIGH (ref 65–99)
HEMATOCRIT: 27 % — AB (ref 36.0–46.0)
HEMOGLOBIN: 9.2 g/dL — AB (ref 12.0–15.0)
POTASSIUM: 2.8 mmol/L — AB (ref 3.5–5.1)
Sodium: 143 mmol/L (ref 135–145)
TCO2: 29 mmol/L (ref 22–32)

## 2017-08-18 LAB — BRAIN NATRIURETIC PEPTIDE: B NATRIURETIC PEPTIDE 5: 1354.3 pg/mL — AB (ref 0.0–100.0)

## 2017-08-18 LAB — TROPONIN I: Troponin I: <0.03 ng/mL (ref <0.03)

## 2017-08-18 LAB — MAGNESIUM: MAGNESIUM: 1.4 mg/dL — AB (ref 1.7–2.4)

## 2017-08-18 MED ORDER — POTASSIUM CHLORIDE CRYS ER 20 MEQ PO TBCR
40.0000 meq | EXTENDED_RELEASE_TABLET | Freq: Once | ORAL | Status: AC
  Administered 2017-08-18: 40 meq via ORAL
  Filled 2017-08-18: qty 2

## 2017-08-18 MED ORDER — TRAMADOL HCL 50 MG PO TABS
50.0000 mg | ORAL_TABLET | Freq: Four times a day (QID) | ORAL | Status: DC | PRN
Start: 2017-08-18 — End: 2017-08-20

## 2017-08-18 MED ORDER — FERROUS SULFATE 325 (65 FE) MG PO TABS
325.0000 mg | ORAL_TABLET | Freq: Every day | ORAL | Status: DC
  Administered 2017-08-19 – 2017-08-20 (×2): 325 mg via ORAL
  Filled 2017-08-18 (×2): qty 1

## 2017-08-18 MED ORDER — LEVOTHYROXINE SODIUM 50 MCG PO TABS
50.0000 ug | ORAL_TABLET | Freq: Every day | ORAL | Status: DC
  Administered 2017-08-19 – 2017-08-20 (×2): 50 ug via ORAL
  Filled 2017-08-18 (×2): qty 1

## 2017-08-18 MED ORDER — ALPRAZOLAM 0.25 MG PO TABS: 0.2500 mg | ORAL_TABLET | Freq: Every day | ORAL | Status: DC | PRN

## 2017-08-18 MED ORDER — CARVEDILOL 12.5 MG PO TABS
12.5000 mg | ORAL_TABLET | Freq: Two times a day (BID) | ORAL | Status: DC
  Administered 2017-08-19 – 2017-08-20 (×5): 12.5 mg via ORAL
  Filled 2017-08-18 (×5): qty 1

## 2017-08-18 MED ORDER — HYDRALAZINE HCL 20 MG/ML IJ SOLN
10.0000 mg | INTRAMUSCULAR | Status: DC | PRN
  Administered 2017-08-18: 10 mg via INTRAVENOUS
  Filled 2017-08-18: qty 1

## 2017-08-18 MED ORDER — ACETAMINOPHEN 325 MG PO TABS: 650.0000 mg | ORAL_TABLET | Freq: Four times a day (QID) | ORAL | Status: DC | PRN

## 2017-08-18 MED ORDER — ACETAMINOPHEN 650 MG RE SUPP: 650.0000 mg | Freq: Four times a day (QID) | RECTAL | Status: DC | PRN

## 2017-08-18 MED ORDER — ONDANSETRON HCL 4 MG/2ML IJ SOLN: 4.0000 mg | Freq: Four times a day (QID) | INTRAMUSCULAR | Status: DC | PRN

## 2017-08-18 MED ORDER — CLONIDINE HCL 0.1 MG PO TABS
0.1000 mg | ORAL_TABLET | Freq: Every day | ORAL | Status: DC
  Administered 2017-08-18 – 2017-08-19 (×2): 0.1 mg via ORAL
  Filled 2017-08-18 (×2): qty 1

## 2017-08-18 MED ORDER — FLUOXETINE HCL 20 MG PO CAPS
20.0000 mg | ORAL_CAPSULE | Freq: Every day | ORAL | Status: DC
  Administered 2017-08-19 – 2017-08-20 (×2): 20 mg via ORAL
  Filled 2017-08-18 (×2): qty 1

## 2017-08-18 MED ORDER — INSULIN ASPART 100 UNIT/ML ~~LOC~~ SOLN
0.0000 [IU] | Freq: Three times a day (TID) | SUBCUTANEOUS | Status: DC
  Administered 2017-08-19 – 2017-08-20 (×3): 1 [IU] via SUBCUTANEOUS
  Administered 2017-08-20: 2 [IU] via SUBCUTANEOUS

## 2017-08-18 MED ORDER — FUROSEMIDE 10 MG/ML IJ SOLN
60.0000 mg | Freq: Once | INTRAMUSCULAR | Status: AC
  Administered 2017-08-18: 60 mg via INTRAVENOUS
  Filled 2017-08-18: qty 6

## 2017-08-18 MED ORDER — ALBUTEROL SULFATE (2.5 MG/3ML) 0.083% IN NEBU: 3.0000 mL | INHALATION_SOLUTION | RESPIRATORY_TRACT | Status: DC | PRN

## 2017-08-18 MED ORDER — PRENATAL PLUS 27-1 MG PO TABS
1.0000 | ORAL_TABLET | Freq: Every day | ORAL | Status: DC
  Administered 2017-08-19 – 2017-08-20 (×2): 1 via ORAL
  Filled 2017-08-18 (×3): qty 1

## 2017-08-18 MED ORDER — ASPIRIN 325 MG PO TABS
325.0000 mg | ORAL_TABLET | Freq: Every day | ORAL | Status: DC
  Administered 2017-08-19 – 2017-08-20 (×2): 325 mg via ORAL
  Filled 2017-08-18 (×3): qty 1

## 2017-08-18 MED ORDER — ENOXAPARIN SODIUM 40 MG/0.4ML ~~LOC~~ SOLN
40.0000 mg | SUBCUTANEOUS | Status: DC
  Administered 2017-08-19 – 2017-08-20 (×2): 40 mg via SUBCUTANEOUS
  Filled 2017-08-18 (×2): qty 0.4

## 2017-08-18 MED ORDER — GUAIFENESIN ER 600 MG PO TB12: 600.0000 mg | ORAL_TABLET | Freq: Two times a day (BID) | ORAL | Status: DC | PRN

## 2017-08-18 MED ORDER — MAGNESIUM OXIDE 400 (241.3 MG) MG PO TABS
400.0000 mg | ORAL_TABLET | Freq: Once | ORAL | Status: AC
  Administered 2017-08-18: 400 mg via ORAL
  Filled 2017-08-18: qty 1

## 2017-08-18 MED ORDER — ONDANSETRON HCL 4 MG PO TABS: 4.0000 mg | ORAL_TABLET | Freq: Four times a day (QID) | ORAL | Status: DC | PRN

## 2017-08-18 MED ORDER — TEMAZEPAM 15 MG PO CAPS
30.0000 mg | ORAL_CAPSULE | Freq: Every day | ORAL | Status: DC
  Administered 2017-08-19: 30 mg via ORAL
  Filled 2017-08-18 (×2): qty 2

## 2017-08-18 MED ORDER — IRBESARTAN 150 MG PO TABS
150.0000 mg | ORAL_TABLET | Freq: Every day | ORAL | Status: DC
  Administered 2017-08-19 – 2017-08-20 (×2): 150 mg via ORAL
  Filled 2017-08-18 (×2): qty 1

## 2017-08-18 MED ORDER — CLONAZEPAM 0.5 MG PO TABS
1.0000 mg | ORAL_TABLET | Freq: Every day | ORAL | Status: DC
  Administered 2017-08-18 – 2017-08-19 (×2): 1 mg via ORAL
  Filled 2017-08-18 (×2): qty 2

## 2017-08-18 MED ORDER — FUROSEMIDE 10 MG/ML IJ SOLN
40.0000 mg | Freq: Two times a day (BID) | INTRAMUSCULAR | Status: DC
  Administered 2017-08-19 – 2017-08-20 (×3): 40 mg via INTRAVENOUS
  Filled 2017-08-18 (×3): qty 4

## 2017-08-18 MED ORDER — ATORVASTATIN CALCIUM 40 MG PO TABS
40.0000 mg | ORAL_TABLET | Freq: Every day | ORAL | Status: DC
  Administered 2017-08-19 – 2017-08-20 (×2): 40 mg via ORAL
  Filled 2017-08-18 (×2): qty 1

## 2017-08-18 NOTE — ED Provider Notes (Signed)
[facility] EMERGENCY DEPARTMENT Provider Note   CSN: [id number] Arrival date & time: 08/18/17  1700     History   Chief Complaint Chief Complaint  Patient presents with  . Shortness of Breath    HPI   Blood pressure (!) 190/52, pulse 70, temperature 98.6 F (37 C), temperature source Oral, resp. rate (!) 24, height 5\' 1"  (1.549 m), weight 79.4 kg (175 lb), SpO2 94 %.  Tonya Glass is a 72 y.o. female with past medical history significant for diastolic CHF, mild pulmonary hypertension, sick sinus with pacemaker, aortic valve replacement with bioprosthetic valve, non-insulin-dependent diabetes, CAD recently admitted to the hospital for hypoxemic respiratory failure thought to be secondary to a multifocal pneumonia/COPD with aspiration, she states that she has been short of breath since she was discharged 3 weeks ago she denies any PND but states that she now sleeps standing up, she becomes very dyspneic on exertion.  Over the last 3 days she has had significantly increasing bilateral lower extremity peripheral edema.  She was initially on Lasix 20 mg every other day, she increase that to every day and now she is taking 40 milligrams daily with little relief.  Dyspnea on exertion however she is putting out more urine.  she last weighed herself today and she was 173 which surprised her because she was 178 last week.  She has been not perfectly compliant with her low sodium dietary restrictions.  She denies any fever, chills, cough, palpitations, chest pain, syncope.  Past Medical History:  Diagnosis Date  . Anxiety   . Aortic valve stenosis 10/10/2016  . Arthritis    "fingers" (07/24/2017)  . CHF (congestive heart failure) ([place])   . COPD (chronic obstructive pulmonary disease) ([place])   . Depression   . Gallstones   . Heart murmur   . High cholesterol   . History of kidney stones   . Hypertension   . Hypothyroidism   . Migraines    "none in years til 01/2013-02/2013;  none since"  (07/24/2017)  . Orthopnoea    "just since open heart surgery" (02/25/2013)  . Pacemaker    [employer]   . Pneumonia ~ 2012   "walking pneumonia"  . Pneumonia 07/23/2017  . Stroke [facility]) 2001   denies residual on 02/25/2013  . Symptomatic hypotension 10/10/2016  . Syncope and collapse    "prior to open heart surgery" (02/25/2013)  . Type II diabetes mellitus [facility])     Patient Active Problem List   Diagnosis Date Noted  . Hypothyroidism 07/23/2017  . Acute respiratory failure with hypoxia ([place]) 07/23/2017  . Pneumonia 07/23/2017  . Diastolic murmur 11/91/4782  . Sick sinus syndrome ([place]) 03/20/2016  . 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  . CAD (coronary artery disease) 09/06/2014  . Pacemaker 05/28/2013  . Essential hypertension 05/28/2013  . Dyslipidemia 05/28/2013  . S/P aortic valve replacement with bioprosthetic valve 05/28/2013  . DM2 (diabetes mellitus, type 2) ([place]) 05/28/2013  . Chronic heart failure ([place]) 05/28/2013    Past Surgical History:  Procedure Laterality Date  . BREAST SURGERY Right    "clamped blood vessel and stitched it up; in [place]"  . CARDIAC CATHETERIZATION    . CARDIAC VALVE REPLACEMENT  01/26/2013   "cow valve put in; widened the second valve" (02/25/2013) - [place], [place]  . CATARACT EXTRACTION W/ INTRAOCULAR LENS  IMPLANT, BILATERAL Bilateral 04/2017  . CHOLECYSTECTOMY OPEN  1971  . CYST EXCISION     "taken off my  chest"  . INSERT / REPLACE / REMOVE PACEMAKER  01/19/2013   [employer] - dual chamber  . [place] RESECTION  2009   in [place]  . VEIN SURGERY Left    LLE; cut leg open and took out piece of vein that was protruding thru skin; stitched it up; done in [place]     OB History   None      Home Medications    Prior to Admission medications   Medication Sig Start Date End Date Taking? Authorizing Provider  albuterol (PROVENTIL HFA;VENTOLIN HFA) 108 (90 BASE) MCG/ACT inhaler Inhale 2 puffs  into the lungs. Use as directed -  With exercise 12/08/13   [provider]  alendronate (FOSAMAX) 70 MG tablet Take 70 mg by mouth every 7 (seven) days. SUNDAYS 07/13/17   [provider]  ALPRAZolam Duanne Moron) 0.25 MG tablet Take 1 tablet (0.25 mg total) by mouth daily as needed for anxiety. 07/26/17   Regalado, Belkys A, MD  aspirin 325 MG tablet Take 325 mg by mouth daily.    [provider]  atorvastatin (LIPITOR) 40 MG tablet Take 40 mg by mouth daily.    [provider]  buPROPion (WELLBUTRIN XL) 150 MG 24 hr tablet Take 150 mg by mouth daily.    [provider]  carvedilol (COREG) 12.5 MG tablet Take 1 tablet (12.5 mg total) 2 (two) times daily by mouth. 03/22/17   Croitoru, Mihai, MD  clonazePAM (KLONOPIN) 1 MG tablet Take 1 tablet (1 mg total) by mouth at bedtime. 07/26/17   Regalado, Belkys A, MD  cloNIDine (CATAPRES) 0.1 MG tablet Take 0.1 mg by mouth at bedtime.  07/16/17   [provider]  FLUoxetine (PROZAC) 20 MG capsule Take 20 mg by mouth daily.    [provider]  furosemide (LASIX) 20 MG tablet Take 20 mg every other day 10/26/16   Pixie Casino, MD  guaiFENesin (MUCINEX) 600 MG 12 hr tablet Take 1 tablet (600 mg total) by mouth 2 (two) times daily as needed for to loosen phlegm. 07/26/17   Regalado, Belkys A, MD  hydrocortisone 2.5 % cream Apply 1 application topically daily. 07/15/17   [provider]  levothyroxine (SYNTHROID, LEVOTHROID) 50 MCG tablet Take 50 mcg by mouth daily before breakfast.    [provider]  metFORMIN (GLUCOPHAGE) 500 MG tablet Take 2 tablets by mouth 2 (two) times daily. 09/04/15   [provider]  olmesartan (BENICAR) 20 MG tablet Take 0.5 tablets (10 mg total) by mouth daily. 05/24/17   Hilty, Nadean Corwin, MD  temazepam (RESTORIL) 30 MG capsule Take 30 mg by mouth at bedtime.  07/07/17   [provider]  Tiotropium Bromide-Olodaterol (STIOLTO RESPIMAT) 2.5-2.5 MCG/ACT  AERS Inhale 2 puffs into the lungs daily.    [provider]  traMADol (ULTRAM) 50 MG tablet Take 50 mg by mouth as needed for moderate pain.  07/23/13   [provider]    Family History Family History  Problem Relation Age of Onset  . Lung cancer Mother   . Heart Problems Father   . Hypertension Brother   . Colon polyps Brother   . Skin cancer Brother   . Colon polyps Daughter   . Diabetes Maternal Aunt   . Breast cancer Maternal Aunt        mets to liver  . Diabetes Paternal Aunt     Social History Social History   Tobacco Use  . Smoking status:  Former Smoker    Packs/day: 2.00    Years: 40.00    Pack years: 80.00    Types: Cigarettes    Last attempt to quit: 05/29/1999    Years since quitting: 18.2  . Smokeless tobacco: Never Used  Substance Use Topics  . Alcohol use: No  . Drug use: No     Allergies   Patient has no known allergies.   Review of Systems Review of Systems  A complete review of systems was obtained and all systems are negative except as noted in the HPI and PMH.    Physical Exam Updated Vital Signs BP (!) 190/52 (BP Location: Left Arm)   Pulse 70   Temp 98.6 F (37 C) (Oral)   Resp (!) 24   Ht 5\' 1"  (1.549 m)   Wt 79.4 kg (175 lb)   SpO2 94%   BMI 33.07 kg/m   Physical Exam  Constitutional: She is oriented to person, place, and time. She appears well-developed and well-nourished. No distress.  HENT:  Head: Normocephalic and atraumatic.  Mouth/Throat: Oropharynx is clear and moist.  Eyes: Pupils are equal, round, and reactive to light. Conjunctivae and EOM are normal.  Neck: Normal range of motion.  Cardiovascular: Normal rate, regular rhythm and intact distal pulses.  Pulmonary/Chest: Effort normal and breath sounds normal.  Diffuse crackles  Abdominal: Soft. There is no tenderness.  Musculoskeletal: Normal range of motion. She exhibits edema.  2+ pitting edema to shin  Neurological: She is alert and oriented  to person, place, and time.  Skin: She is not diaphoretic.  Psychiatric: She has a normal mood and affect.  Nursing note and vitals reviewed.    ED Treatments / Results  Labs (all labs ordered are listed, but only abnormal results are displayed) Labs Reviewed  BRAIN NATRIURETIC PEPTIDE - Abnormal; Notable for the following components:      Result Value   B Natriuretic Peptide 1,354.3 (*)    All other components within normal limits  CBC WITH DIFFERENTIAL/PLATELET - Abnormal; Notable for the following components:   RBC 3.27 (*)    Hemoglobin 8.4 (*)    HCT 27.0 (*)    MCH 25.7 (*)    RDW 17.5 (*)    All other components within normal limits  COMPREHENSIVE METABOLIC PANEL - Abnormal; Notable for the following components:   Potassium 2.8 (*)    Glucose, Bld 108 (*)    Total Protein 5.6 (*)    Total Bilirubin 1.3 (*)    All other components within normal limits  I-STAT CHEM 8, ED - Abnormal; Notable for the following components:   Potassium 2.8 (*)    Glucose, Bld 110 (*)    Calcium, Ion 1.08 (*)    Hemoglobin 9.2 (*)    HCT 27.0 (*)    All other components within normal limits  TROPONIN I  MAGNESIUM    EKG None  Radiology No results found.  Procedures Procedures (including critical care time)  Medications Ordered in ED Medications - No data to display   Initial Impression / Assessment and Plan / ED Course  I have reviewed the triage vital signs and the nursing notes.  Pertinent labs & imaging results that were available during my care of the patient were reviewed by me and considered in my medical decision making (see chart for details).     Vitals:   08/18/17 1702 08/18/17 1704  BP:  (!) 190/52  Pulse:  70  Resp:  Marland Kitchen)  24  Temp:  98.6 F (37 C)  TempSrc:  Oral  SpO2:  94%  Weight: 79.4 kg (175 lb)   Height: 5\' 1"  (1.549 m)     Medications  magnesium oxide (MAG-OX) tablet 400 mg (400 mg Oral Given 08/18/17 1959)  potassium chloride SA  (K-DUR,KLOR-CON) CR tablet 40 mEq (40 mEq Oral Given 08/18/17 1959)  furosemide (LASIX) injection 60 mg (60 mg Intravenous Given 08/18/17 1959)    Tonya Glass is 72 y.o. female presenting with shortness of breath and dyspnea on exertion, she is sleeping sitting up.  Last echo with diastolic dysfunction.  Normal EF.  Physical exam with diffuse crackles.  Chest x-ray clear however, clinically CHF exacerbation, will start on 60 of Lasix IV.  EKG unchanged.  Mild hypo-kalemia.  Will supplement magnesium and potassium orally.  ProBNP has resulted elevated at 1500.  Admitted to [facility] hospitalist Dr. Hal Hope.    Final Clinical Impressions(s) / ED Diagnoses   Final diagnoses:  Congestive heart failure, unspecified HF chronicity, unspecified heart failure type [facility])    ED Discharge Orders    None       Waynetta Pean 08/18/17 2047

## 2017-08-18 NOTE — H&P (Signed)
History and Physical    Tonya Glass YQI:[id number] DOB: [birth year] DOA: 08/18/2017  PCP: Hayden Rasmussen, MD  Patient coming from: Home.  Chief Complaint: Shortness of breath.  HPI: Tonya Glass is a 72 y.o. female with history of bioprosthetic aortic valve replacement, hypertension, pacemaker placement, chronic anemia, diabetes mellitus who was recently admitted for pneumonia presents to the ER because of worsening shortness of breath and increasing lower extremity edema.  Patient states since her discharge 2 weeks ago after being treated for pneumonia patient shortness of breath has worsened and unable to lie flat.  Denies any chest pain productive cough fever or chills.  Has gained a lot of lower extremity edema but not has gained weight.  Has been taking her Lasix as advised.  ED Course: In the ER on exam patient has elevated JVD and lower bilateral lower extremity edema.  Chest x-ray was unremarkable.  BNP was 1300.  Patient was given IV Lasix and admitted for acute diastolic CHF.  Review of Systems: As per HPI, rest all negative.   Past Medical History:  Diagnosis Date  . Anxiety   . Aortic valve stenosis 10/10/2016  . Arthritis    "fingers" (07/24/2017)  . CHF (congestive heart failure) ([place])   . COPD (chronic obstructive pulmonary disease) ([place])   . Depression   . Gallstones   . Heart murmur   . High cholesterol   . History of kidney stones   . Hypertension   . Hypothyroidism   . Migraines    "none in years til 01/2013-02/2013; none since"  (07/24/2017)  . Orthopnoea    "just since open heart surgery" (02/25/2013)  . Pacemaker    [employer]   . Pneumonia ~ 2012   "walking pneumonia"  . Pneumonia 07/23/2017  . Stroke [facility]) 2001   denies residual on 02/25/2013  . Symptomatic hypotension 10/10/2016  . Syncope and collapse    "prior to open heart surgery" (02/25/2013)  . Type II diabetes mellitus ([place])     Past Surgical History:  Procedure Laterality Date   . BREAST SURGERY Right    "clamped blood vessel and stitched it up; in [place]"  . CARDIAC CATHETERIZATION    . CARDIAC VALVE REPLACEMENT  01/26/2013   "cow valve put in; widened the second valve" (02/25/2013) - [place], [place]  . CATARACT EXTRACTION W/ INTRAOCULAR LENS  IMPLANT, BILATERAL Bilateral 04/2017  . CHOLECYSTECTOMY OPEN  1971  . CYST EXCISION     "taken off my chest"  . INSERT / REPLACE / REMOVE PACEMAKER  01/19/2013   [employer] - dual chamber  . [place] RESECTION  2009   in [place]  . VEIN SURGERY Left    LLE; cut leg open and took out piece of vein that was protruding thru skin; stitched it up; done in [place]     reports that she quit smoking about 18 years ago. Her smoking use included cigarettes. She has a 80.00 pack-year smoking history. She has never used smokeless tobacco. She reports that she does not drink alcohol or use drugs.  No Known Allergies  Family History  Problem Relation Age of Onset  . Lung cancer Mother   . Heart Problems Father   . Hypertension Brother   . Colon polyps Brother   . Skin cancer Brother   . Colon polyps Daughter   . Diabetes Maternal Aunt   . Breast cancer Maternal Aunt        mets to liver  .  Diabetes Paternal Aunt     Prior to Admission medications   Medication Sig Start Date End Date Taking? Authorizing Provider  albuterol (PROVENTIL HFA;VENTOLIN HFA) 108 (90 BASE) MCG/ACT inhaler Inhale 2 puffs into the lungs. Use as directed -  With exercise 12/08/13  Yes [provider]  alendronate (FOSAMAX) 70 MG tablet Take 70 mg by mouth every 7 (seven) days. SUNDAYS 07/13/17  Yes [provider]  ALPRAZolam (XANAX) 0.25 MG tablet Take 1 tablet (0.25 mg total) by mouth daily as needed for anxiety. 07/26/17  Yes Regalado, Belkys A, MD  aspirin 325 MG tablet Take 325 mg by mouth daily.   Yes [provider]  atorvastatin (LIPITOR) 40 MG tablet Take 40 mg by mouth daily.   Yes [provider]  carvedilol (COREG) 12.5 MG tablet Take 1 tablet (12.5 mg total) 2 (two) times daily by mouth. 03/22/17  Yes Croitoru, Mihai, MD  clonazePAM (KLONOPIN) 1 MG tablet Take 1 tablet (1 mg total) by mouth at bedtime. 07/26/17  Yes Regalado, Belkys A, MD  cloNIDine (CATAPRES) 0.1 MG tablet Take 0.1 mg by mouth at bedtime.  07/16/17  Yes [provider]  Ferrous Sulfate (IRON) 325 (65 Fe) MG TABS Take 65 mg by mouth daily. 08/13/17  Yes [provider]  FLUoxetine (PROZAC) 20 MG capsule Take 20 mg by mouth daily.   Yes [provider]  furosemide (LASIX) 20 MG tablet Take 20 mg every other day 10/26/16  Yes Hilty, Nadean Corwin, MD  hydrocortisone 2.5 % cream Apply 1 application topically daily. 07/15/17  Yes [provider]  levothyroxine (SYNTHROID, LEVOTHROID) 50 MCG tablet Take 50 mcg by mouth daily before breakfast.   Yes [provider]  metFORMIN (GLUCOPHAGE) 500 MG tablet Take 1,000 mg by mouth 2 (two) times daily.  09/04/15  Yes [provider]  olmesartan (BENICAR) 20 MG tablet Take 0.5 tablets (10 mg total) by mouth daily. 05/24/17  Yes Hilty, Nadean Corwin, MD  Prenatal Vit-Fe Fumarate-FA (PREPLUS) 27-1 MG TABS Take 1 tablet by mouth daily. 08/14/17  Yes [provider]  temazepam (RESTORIL) 30 MG capsule Take 30 mg by mouth at bedtime.  07/07/17  Yes [provider]  Tiotropium Bromide-Olodaterol (STIOLTO RESPIMAT) 2.5-2.5 MCG/ACT AERS Inhale 2 puffs into the lungs daily.   Yes [provider]  traMADol (ULTRAM) 50 MG tablet Take 50 mg by mouth as needed for moderate pain.  07/23/13  Yes [provider]  Vitamin D, Ergocalciferol, (DRISDOL) 50000 units CAPS capsule Take 50,000 Units by mouth every 7 (seven) days. Tuesday of each week 08/13/17  Yes [provider]  guaiFENesin (MUCINEX) 600 MG 12 hr tablet Take 1 tablet (600 mg total) by mouth 2 (two) times daily as needed for to loosen phlegm. Patient not  taking: Reported on 08/18/2017 07/26/17   Elmarie Shiley, MD    Physical Exam: Vitals:   08/18/17 1704 08/18/17 1900 08/18/17 1930 08/18/17 2000  BP: (!) 190/52 (!) 198/52 (!) 196/56 (!) 196/57  Pulse: 70 70 69 70  Resp: (!) 24 20 (!) 22 18  Temp: 98.6 F (37 C)     TempSrc: Oral     SpO2: 94% 94% 94% 95%  Weight:      Height:          Constitutional: Moderately built and nourished. Vitals:   08/18/17 1704 08/18/17 1900 08/18/17 1930 08/18/17 2000  BP: (!) 190/52 (!) 198/52 (!) 196/56 (!) 196/57  Pulse: 70  70 69 70  Resp: (!) 24 20 (!) 22 18  Temp: 98.6 F (37 C)     TempSrc: Oral     SpO2: 94% 94% 94% 95%  Weight:      Height:       Eyes: Anicteric no pallor. ENMT: No discharge from the ears eyes nose or mouth. Neck: No mass felt.  No neck rigidity.  JVD elevated. Respiratory: No rhonchi mild crepitations. Cardiovascular: S1-S2 heard no murmurs appreciated. Abdomen: Soft nontender bowel sounds present. Musculoskeletal: Bilateral lower extremity edema present. Skin: No rash. Neurologic: Alert awake oriented to time place and person.  Moves all extremities. Psychiatric: Appears normal.  Normal affect.   Labs on Admission: I have personally reviewed following labs and imaging studies  CBC: Recent Labs  Lab 08/18/17 1847 08/18/17 1858  WBC 5.9  --   NEUTROABS 3.6  --   HGB 8.4* 9.2*  HCT 27.0* 27.0*  MCV 82.6  --   PLT 236  --    Basic Metabolic Panel: Recent Labs  Lab 08/18/17 1847 08/18/17 1858 08/18/17 2105  NA 142 143  --   K 2.8* 2.8*  --   CL 103 101  --   CO2 26  --   --   GLUCOSE 108* 110*  --   BUN 12 12  --   CREATININE 0.69 0.70  --   CALCIUM 8.9  --   --   MG  --   --  1.4*   GFR: Estimated Creatinine Clearance: 61.5 mL/min (by C-G formula based on SCr of 0.7 mg/dL). Liver Function Tests: Recent Labs  Lab 08/18/17 1847  AST 23  ALT 16  ALKPHOS 59  BILITOT 1.3*  PROT 5.6*  ALBUMIN 3.5   No results for input(s): LIPASE,  AMYLASE in the last 168 hours. No results for input(s): AMMONIA in the last 168 hours. Coagulation Profile: No results for input(s): INR, PROTIME in the last 168 hours. Cardiac Enzymes: Recent Labs  Lab 08/18/17 1847  TROPONINI <0.03   BNP (last 3 results) No results for input(s): PROBNP in the last 8760 hours. HbA1C: No results for input(s): HGBA1C in the last 72 hours. CBG: No results for input(s): GLUCAP in the last 168 hours. Lipid Profile: No results for input(s): CHOL, HDL, LDLCALC, TRIG, CHOLHDL, LDLDIRECT in the last 72 hours. Thyroid Function Tests: No results for input(s): TSH, T4TOTAL, FREET4, T3FREE, THYROIDAB in the last 72 hours. Anemia Panel: No results for input(s): VITAMINB12, FOLATE, FERRITIN, TIBC, IRON, RETICCTPCT in the last 72 hours. Urine analysis:    Component Value Date/Time   COLORURINE YELLOW 07/23/2017 1036   APPEARANCEUR CLOUDY (A) 07/23/2017 1036   LABSPEC >1.030 (H) 07/23/2017 1036   PHURINE 5.5 07/23/2017 1036   GLUCOSEU NEGATIVE 07/23/2017 1036   HGBUR TRACE (A) 07/23/2017 1036   BILIRUBINUR NEGATIVE 07/23/2017 1036   KETONESUR NEGATIVE 07/23/2017 1036   PROTEINUR 100 (A) 07/23/2017 1036   NITRITE POSITIVE (A) 07/23/2017 1036   LEUKOCYTESUR NEGATIVE 07/23/2017 1036   Sepsis Labs: @LABRCNTIP (procalcitonin:4,lacticidven:4) )No results found for this or any previous visit (from the past 240 hour(s)).   Radiological Exams on Admission: Dg Chest 2 View  Result Date: 08/18/2017 CLINICAL DATA:  Short of breath for 3 days.  Edema. EXAM: CHEST - 2 VIEW COMPARISON:  07/26/2017 FINDINGS: Mild to moderate enlargement of the cardiopericardial silhouette. Stable changes from previous cardiac surgery and aortic valve replacement. No mediastinal or hilar masses. Left anterior chest wall sequential pacemaker is stable  and well positioned. Lungs are hyperexpanded. Mildly prominent bronchovascular markings, decreased when compared to the prior radiographs.  Apparent pleural based circumscribed mass, most likely posterior loculated fluid, inferiorly on lateral view. No free-flowing pleural effusion. No evidence of pneumonia. No convincing pulmonary edema. Skeletal structures are demineralized but grossly intact. IMPRESSION: 1. No acute findings.  No evidence of pneumonia or pulmonary edema. Electronically Signed   By: Lajean Manes M.D.   On: 08/18/2017 18:36    EKG: Independently reviewed.  Normal sinus rhythm right bundle branch block.  Assessment/Plan Principal Problem:   Acute diastolic CHF (congestive heart failure) (HCC) Active Problems:   Essential hypertension   S/P aortic valve replacement with bioprosthetic valve   DM2 (diabetes mellitus, type 2) (HCC)   H/O aortic root repair   CAD (coronary artery disease)   Sick sinus syndrome (HCC)   Hypothyroidism   Acute CHF (congestive heart failure) (HCC)   CHF (congestive heart failure) ([place])    1. Acute diastolic CHF with history of bioprosthetic aortic valve replacement last EF measured in April 2018 was 55-60% with grade 2 diastolic dysfunction -patient was given IV Lasix in the ER and I have continued Lasix 40 mg every 12.  Follow intake output metabolic panel and daily weights.  I have ordered 2D echo. 2. Hypertensive urgency -probably contributing to patient's symptoms.  I have ordered PRN IV hydralazine along with patient's home medications including Coreg, clonidine and ARB.  Blood pressure tends to remain high may have to increase her antihypertensive doses. 3. Hypothyroidism on Synthroid. 4. History of aortic valve replacement bioprosthetic check 2D echo. 5. Diabetes mellitus type 2 -placed on sliding scale coverage. 6. Sick sinus syndrome and history of complete heart block status post pacemaker placement. 7. COPD presently not actively wheezing on inhalers. 8. Chronic normocytic normochromic anemia -follow CBC. 9. Hypokalemia likely from diuretics.  Replace and recheck.  Check  magnesium levels.   DVT prophylaxis: Lovenox. Code Status: DNR. Family Communication: Discussed with patient. Disposition Plan: Home. Consults called: None. Admission status: Inpatient.   Rise Patience MD [facility] Pager [phone].  If 7PM-7AM, please contact night-coverage [email or web address] Password [facility]  08/18/2017, 10:29 PM

## 2017-08-18 NOTE — ED Notes (Signed)
Attempted report x1. RN, Ronalee Belts unavailable @ this time.

## 2017-08-18 NOTE — ED Provider Notes (Signed)
Medical screening examination/treatment/procedure(s) were conducted as a shared visit with non-physician practitioner(s) and myself.  I personally evaluated the patient during the encounter. Briefly, the patient is a 72 y.o. female here with worsening SOB, DOE. With BLE edema. Presentation is consistent with dHF exacerbation. Admitted for further management.    EKG Interpretation  Date/Time:  Sunday August 18 2017 19:44:31 EDT Ventricular Rate:  70 PR Interval:    QRS Duration: 146 QT Interval:  480 QTC Calculation: 518 R Axis:   60 Text Interpretation:  Sinus rhythm Borderline prolonged PR interval Right bundle branch block No significant change since last tracing Confirmed by Addison Lank [phone]) on 08/18/2017 7:47:20 PM           Cozetta Seif, Grayce Sessions, MD 08/18/17 2012

## 2017-08-18 NOTE — ED Triage Notes (Signed)
BIB EMS from home, reports SOB X3 days with bilateral pitting edema. Pt has hx of CHF and attempted to inc her lasix at home 20 to 40 with no relief. Denies CP. Hypertensive.

## 2017-08-19 ENCOUNTER — Inpatient Hospital Stay (HOSPITAL_COMMUNITY): Payer: Medicare Other

## 2017-08-19 DIAGNOSIS — R609 Edema, unspecified: Secondary | ICD-10-CM

## 2017-08-19 DIAGNOSIS — I5031 Acute diastolic (congestive) heart failure: Secondary | ICD-10-CM | POA: Diagnosis not present

## 2017-08-19 DIAGNOSIS — I340 Nonrheumatic mitral (valve) insufficiency: Secondary | ICD-10-CM

## 2017-08-19 DIAGNOSIS — I110 Hypertensive heart disease with heart failure: Secondary | ICD-10-CM | POA: Diagnosis not present

## 2017-08-19 LAB — GLUCOSE, CAPILLARY
GLUCOSE-CAPILLARY: 111 mg/dL — AB (ref 65–99)
GLUCOSE-CAPILLARY: 136 mg/dL — AB (ref 65–99)
GLUCOSE-CAPILLARY: 151 mg/dL — AB (ref 65–99)
Glucose-Capillary: 145 mg/dL — ABNORMAL HIGH (ref 65–99)

## 2017-08-19 LAB — BASIC METABOLIC PANEL
Anion gap: 12 (ref 5–15)
BUN: 11 mg/dL (ref 6–20)
CHLORIDE: 100 mmol/L — AB (ref 101–111)
CO2: 31 mmol/L (ref 22–32)
CREATININE: 0.8 mg/dL (ref 0.44–1.00)
Calcium: 9 mg/dL (ref 8.9–10.3)
GFR calc non Af Amer: >60 mL/min (ref >60)
Glucose, Bld: 133 mg/dL — ABNORMAL HIGH (ref 65–99)
Potassium: 3.2 mmol/L — ABNORMAL LOW (ref 3.5–5.1)
Sodium: 143 mmol/L (ref 135–145)

## 2017-08-19 LAB — CBC
HCT: 28 % — ABNORMAL LOW (ref 36.0–46.0)
Hemoglobin: 8.6 g/dL — ABNORMAL LOW (ref 12.0–15.0)
MCH: 25.3 pg — AB (ref 26.0–34.0)
MCHC: 30.7 g/dL (ref 30.0–36.0)
MCV: 82.4 fL (ref 78.0–100.0)
PLATELETS: 273 10*3/uL (ref 150–400)
RBC: 3.4 MIL/uL — AB (ref 3.87–5.11)
RDW: 17.6 % — ABNORMAL HIGH (ref 11.5–15.5)
WBC: 7 10*3/uL (ref 4.0–10.5)

## 2017-08-19 LAB — ECHOCARDIOGRAM COMPLETE
Height: 61 in
Weight: 2681.6 oz

## 2017-08-19 LAB — MAGNESIUM: Magnesium: 1.4 mg/dL — ABNORMAL LOW (ref 1.7–2.4)

## 2017-08-19 MED ORDER — POTASSIUM CHLORIDE CRYS ER 20 MEQ PO TBCR
60.0000 meq | EXTENDED_RELEASE_TABLET | Freq: Once | ORAL | Status: AC
  Administered 2017-08-19: 60 meq via ORAL
  Filled 2017-08-19: qty 3

## 2017-08-19 MED ORDER — MAGNESIUM OXIDE 400 (241.3 MG) MG PO TABS
400.0000 mg | ORAL_TABLET | Freq: Every day | ORAL | Status: DC
Start: 2017-08-19 — End: 2017-08-20
  Administered 2017-08-19 – 2017-08-20 (×2): 400 mg via ORAL
  Filled 2017-08-19 (×2): qty 1

## 2017-08-19 MED ORDER — MAGNESIUM SULFATE 2 GM/50ML IV SOLN
2.0000 g | Freq: Once | INTRAVENOUS | Status: AC
  Administered 2017-08-19: 2 g via INTRAVENOUS
  Filled 2017-08-19: qty 50

## 2017-08-19 NOTE — Care Management Note (Signed)
Case Management Note  Patient Details  Name: Tonya Glass MRN: [id number] Date of Birth: 20-Mar-1946  Subjective/Objective:  CHF                Action/Plan: Patient lives at home; PCP: Hayden Rasmussen, MD; has private insurance with [facility] with prescription drug coverage; CM following for progression of care.   Expected Discharge Date:    possibly 08/22/2017              Expected Discharge Plan:  [place] possibly  In-House Referral:   Nutritional Consult for making wise food choices   Discharge planning Services  CM Consult  Status of Service:  In process, will continue to follow  Sherrilyn Rist [phone] 08/19/2017, 11:14 AM

## 2017-08-19 NOTE — Progress Notes (Signed)
Patient ID: Tonya Glass, female   DOB: [birth year], 72 y.o.   MRN: [id number]                                                                PROGRESS NOTE                                                                                                                                                                                                             Patient Demographics:    Tonya Glass, is a 72 y.o. female, DOB - 1946-01-21, MWU:[id number]  Admit date - 08/18/2017   Admitting Physician Rise Patience, MD  Outpatient Primary MD for the patient is Hayden Rasmussen, MD  LOS - 1  Outpatient Specialists:     Chief Complaint  Patient presents with  . Shortness of Breath       Brief Narrative    72 y.o. female with past medical history significant for diastolic CHF, mild pulmonary hypertension, sick sinus with pacemaker, aortic valve replacement with bioprosthetic valve, non-insulin-dependent diabetes, CAD recently admitted to the hospital for hypoxemic respiratory failure thought to be secondary to a multifocal pneumonia/COPD with aspiration, she states that she has been short of breath since she was discharged 3 weeks ago she denies any PND but states that she now sleeps standing up, she becomes very dyspneic on exertion.  Over the last 3 days she has had significantly increasing bilateral lower extremity peripheral edema.  She was initially on Lasix 20 mg every other day, she increase that to every day and now she is taking 40 milligrams daily with little relief.  Dyspnea on exertion however she is putting out more urine.  she last weighed herself today and she was 173 which surprised her because she was 178 last week.  She has been not perfectly compliant with her low sodium dietary restrictions.  She denies any fever, chills, cough, palpitations, chest pain, syncope.     Subjective:    Tonya Glass today states that her breathing is better. Pt took off her o2.  Still with  slight leg edema.    No headache, No chest pain, No abdominal pain - No Nausea, No new weakness tingling or numbness, No Cough -   Assessment  & Plan :    Principal Problem:  Acute diastolic CHF (congestive heart failure) (HCC) Active Problems:   Essential hypertension   S/P aortic valve replacement with bioprosthetic valve   DM2 (diabetes mellitus, type 2) (HCC)   H/O aortic root repair   CAD (coronary artery disease)   Sick sinus syndrome (HCC)   Hypothyroidism   Acute CHF (congestive heart failure) (HCC)   CHF (congestive heart failure) (HCC)     CHF (EF 55%), Mild-Mod AR Cont Irbesartan Cont Lasix 40mg  iv bid Cardiac echo  Hypokalemia Replete Check cmp in am  Hypomagnesemia Replete Check magnesium in am  Anemia Check cbc in am    Code Status : DNR  Family Communication  : w patient  Disposition Plan  :  TBD  Barriers For Discharge :   Consults  :    Procedures  : echo  DVT Prophylaxis  :  Lovenox - - SCDs   Lab Results  Component Value Date   PLT 236 08/18/2017    Antibiotics  :      Anti-infectives (From admission, onward)   None        Objective:   Vitals:   08/18/17 2000 08/19/17 0007 08/19/17 0117 08/19/17 0200  BP: (!) 196/57 (!) 202/65 (!) 108/41 (!) 110/50  Pulse: 70 71 69 70  Resp: 18 18    Temp:  (!) 97.2 F (36.2 C)    TempSrc:  Oral    SpO2: 95% 96%    Weight:  76 kg (167 lb 9.6 oz)    Height:  5\' 1"  (1.549 m)      Wt Readings from Last 3 Encounters:  08/19/17 76 kg (167 lb 9.6 oz)  07/25/17 85 kg (187 lb 4.8 oz)  03/22/17 77.6 kg (171 lb)     Intake/Output Summary (Last 24 hours) at 08/19/2017 0529 Last data filed at 08/19/2017 0400 Gross per 24 hour  Intake -  Output 1300 ml  Net -1300 ml     Physical Exam  Awake Alert, Oriented X 3, No new F.N deficits, Normal affect [place].AT,PERRAL Supple Neck, slight JVD, No cervical lymphadenopathy appriciated.  Symmetrical Chest wall movement, Good air movement  bilaterally, right base crackle, slight decrease in bs at left lung base RRR,No Gallops,Rubs or new Murmurs, No Parasternal Heave +ve B.Sounds, Abd Soft, No tenderness, No organomegaly appriciated, No rebound - guarding or rigidity. No Cyanosis, Clubbing or edema, No new Rash or bruise      Data Review:    CBC Recent Labs  Lab 08/18/17 1847 08/18/17 1858  WBC 5.9  --   HGB 8.4* 9.2*  HCT 27.0* 27.0*  PLT 236  --   MCV 82.6  --   MCH 25.7*  --   MCHC 31.1  --   RDW 17.5*  --   LYMPHSABS 1.5  --   MONOABS 0.4  --   EOSABS 0.3  --   BASOSABS 0.0  --     Chemistries  Recent Labs  Lab 08/18/17 1847 08/18/17 1858 08/18/17 2105  NA 142 143  --   K 2.8* 2.8*  --   CL 103 101  --   CO2 26  --   --   GLUCOSE 108* 110*  --   BUN 12 12  --   CREATININE 0.69 0.70  --   CALCIUM 8.9  --   --   MG  --   --  1.4*  AST 23  --   --   ALT 16  --   --  ALKPHOS 59  --   --   BILITOT 1.3*  --   --    ------------------------------------------------------------------------------------------------------------------ No results for input(s): CHOL, HDL, LDLCALC, TRIG, CHOLHDL, LDLDIRECT in the last 72 hours.  Lab Results  Component Value Date   HGBA1C 5.6 07/23/2017   ------------------------------------------------------------------------------------------------------------------ No results for input(s): TSH, T4TOTAL, T3FREE, THYROIDAB in the last 72 hours.  Invalid input(s): FREET3 ------------------------------------------------------------------------------------------------------------------ No results for input(s): VITAMINB12, FOLATE, FERRITIN, TIBC, IRON, RETICCTPCT in the last 72 hours.  Coagulation profile No results for input(s): INR, PROTIME in the last 168 hours.  No results for input(s): DDIMER in the last 72 hours.  Cardiac Enzymes Recent Labs  Lab 08/18/17 1847  TROPONINI <0.03    ------------------------------------------------------------------------------------------------------------------    Component Value Date/Time   BNP 1,354.3 (H) 08/18/2017 1847    Inpatient Medications  Scheduled Meds: . aspirin  325 mg Oral Daily  . atorvastatin  40 mg Oral Daily  . carvedilol  12.5 mg Oral BID WC  . clonazePAM  1 mg Oral QHS  . cloNIDine  0.1 mg Oral QHS  . enoxaparin (LOVENOX) injection  40 mg Subcutaneous Q24H  . ferrous sulfate  325 mg Oral Daily  . FLUoxetine  20 mg Oral Daily  . furosemide  40 mg Intravenous Q12H  . insulin aspart  0-9 Units Subcutaneous TID WC  . irbesartan  150 mg Oral Daily  . levothyroxine  50 mcg Oral QAC breakfast  . prenatal vitamin w/FE, FA  1 tablet Oral Daily  . temazepam  30 mg Oral QHS   Continuous Infusions: PRN Meds:.acetaminophen **OR** acetaminophen, albuterol, ALPRAZolam, guaiFENesin, hydrALAZINE, ondansetron **OR** ondansetron (ZOFRAN) IV, traMADol  Micro Results No results found for this or any previous visit (from the past 240 hour(s)).  Radiology Reports Dg Chest 2 View  Result Date: 08/18/2017 CLINICAL DATA:  Short of breath for 3 days.  Edema. EXAM: CHEST - 2 VIEW COMPARISON:  07/26/2017 FINDINGS: Mild to moderate enlargement of the cardiopericardial silhouette. Stable changes from previous cardiac surgery and aortic valve replacement. No mediastinal or hilar masses. Left anterior chest wall sequential pacemaker is stable and well positioned. Lungs are hyperexpanded. Mildly prominent bronchovascular markings, decreased when compared to the prior radiographs. Apparent pleural based circumscribed mass, most likely posterior loculated fluid, inferiorly on lateral view. No free-flowing pleural effusion. No evidence of pneumonia. No convincing pulmonary edema. Skeletal structures are demineralized but grossly intact. IMPRESSION: 1. No acute findings.  No evidence of pneumonia or pulmonary edema. Electronically Signed    By: Lajean Manes M.D.   On: 08/18/2017 18:36   Dg Chest 2 View  Result Date: 07/26/2017 CLINICAL DATA:  Cough and shortness of breath. EXAM: CHEST - 2 VIEW COMPARISON:  CT chest dated July 24, 2017. Chest x-ray dated July 23, 2017. FINDINGS: Stable borderline cardiomegaly status post aortic valve replacement. Unchanged left chest wall pacemaker. Normal pulmonary vascularity. Improved consolidation in the right lung. Persistent loculated pleural effusion on the right, slightly decreased in size. Bibasilar atelectasis. No pneumothorax. No acute osseous abnormality. IMPRESSION: 1. Improved consolidation in the right lung. 2. Persistent loculated pleural effusion on the right, slightly decreased in size. Electronically Signed   By: Titus Dubin M.D.   On: 07/26/2017 09:57   Ct Chest Wo Contrast  Result Date: 07/24/2017 CLINICAL DATA:  Shortness of breath, pneumonia. EXAM: CT CHEST WITHOUT CONTRAST TECHNIQUE: Multidetector CT imaging of the chest was performed following the standard protocol without IV contrast. COMPARISON:  CT scan of June 14, 2017  and February 26, 2013. Radiograph of July 23, 2017. FINDINGS: Cardiovascular: Atherosclerosis of thoracic aorta is noted. Status post aortic valve replacement. 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 Left-sided pacemaker is seen in grossly good position. Mediastinum/Nodes: Thyroid gland is unremarkable. Small sliding-type hiatal hernia is noted. Stable mediastinal adenopathy is noted which most likely is reactive and benign in etiology. Lungs/Pleura: No pneumothorax is noted. Left lung is clear. Interval development of multifocal airspace opacities throughout the right lung most consistent with pneumonia. Moderate right lower lobe opacity is noted posteriorly consistent with atelectasis or pneumonia. Moderate loculated right pleural effusion is noted. Upper Abdomen: No acute abnormality. Musculoskeletal: No  chest wall mass or suspicious bone lesions identified. IMPRESSION: Multifocal airspace opacities are noted throughout the right lung most consistent with multifocal pneumonia. Moderate right posterior basilar opacity is noted consistent with atelectasis or pneumonia, with associated moderate loculated right pleural effusion. Possibility of empyema cannot be excluded. Small sliding-type hiatal hernia. Stable mediastinal adenopathy is noted which most likely is reactive and benign in etiology. 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 Recommend semi-annual imaging followup by CTA or MRA and referral to cardiothoracic surgery if not already obtained. This recommendation follows 2010 ACCF/AHA/AATS/ACR/ASA/SCA/SCAI/SIR/STS/SVM Guidelines for the Diagnosis and Management of Patients With Thoracic Aortic Disease. Circulation. 2010; 121: e266-e36. Aortic Atherosclerosis (ICD10-I70.0). Electronically Signed   By: Marijo Conception, M.D.   On: 07/24/2017 13:06   Dg Chest Portable 1 View  Result Date: 07/23/2017 CLINICAL DATA:  Shortness of Breath EXAM: PORTABLE CHEST 1 VIEW COMPARISON:  Chest radiograph May 21, 2017 and chest CT June 14, 2017 FINDINGS: There is airspace consolidation throughout much the right lung with the greatest degree of concentration of consolidation in the right mid to lower lung regions. Left lung is clear. Heart is mildly enlarged with pulmonary vascularity within normal limits. Patient is status post aortic valve replacement. Pacemaker lead tips are attached to the right atrium and right ventricle. Patient is status post coronary artery bypass grafting. No adenopathy. No bone lesions. IMPRESSION: Extensive consolidation throughout much of the right lung consistent with multifocal pneumonia. Left lung clear. Stable cardiac prominence. Postoperative changes as noted. Electronically Signed   By: Lowella Grip III M.D.   On: 07/23/2017 08:09    Time Spent  in minutes  30   Jani Gravel M.D on 08/19/2017 at 5:29 AM  Between 7am to 7pm - Pager - [phone]    After 7pm go to [email or web address] - password [facility]  [facility] -  Office  [phone]

## 2017-08-19 NOTE — Progress Notes (Signed)
  Echocardiogram 2D Echocardiogram has been performed.  Tonya Glass 08/19/2017, 11:09 AM

## 2017-08-19 NOTE — Progress Notes (Signed)
VASCULAR LAB PRELIMINARY  PRELIMINARY  PRELIMINARY  PRELIMINARY  Bilateral lower extremity venous duplex completed.    Preliminary report:  There is no DVT or SVT noted in the bilateral lower extremities.  There is significant interstitial fluid noted throughout the bilateral calves.  Aronda Burford, RVT 08/19/2017, 3:43 PM

## 2017-08-20 ENCOUNTER — Other Ambulatory Visit: Payer: Self-pay

## 2017-08-20 ENCOUNTER — Encounter (HOSPITAL_COMMUNITY): Payer: Self-pay | Admitting: General Practice

## 2017-08-20 DIAGNOSIS — I5031 Acute diastolic (congestive) heart failure: Secondary | ICD-10-CM | POA: Diagnosis not present

## 2017-08-20 DIAGNOSIS — I110 Hypertensive heart disease with heart failure: Secondary | ICD-10-CM | POA: Diagnosis not present

## 2017-08-20 LAB — COMPREHENSIVE METABOLIC PANEL
ALBUMIN: 3.5 g/dL (ref 3.5–5.0)
ALT: 14 U/L (ref 14–54)
ANION GAP: 9 (ref 5–15)
AST: 18 U/L (ref 15–41)
Alkaline Phosphatase: 59 U/L (ref 38–126)
BILIRUBIN TOTAL: 0.8 mg/dL (ref 0.3–1.2)
BUN: 18 mg/dL (ref 6–20)
CHLORIDE: 101 mmol/L (ref 101–111)
CO2: 30 mmol/L (ref 22–32)
Calcium: 8.7 mg/dL — ABNORMAL LOW (ref 8.9–10.3)
Creatinine, Ser: 0.97 mg/dL (ref 0.44–1.00)
GFR calc Af Amer: >60 mL/min (ref >60)
GFR, EST NON AFRICAN AMERICAN: 57 mL/min — AB (ref >60)
Glucose, Bld: 163 mg/dL — ABNORMAL HIGH (ref 65–99)
POTASSIUM: 3.6 mmol/L (ref 3.5–5.1)
Sodium: 140 mmol/L (ref 135–145)
TOTAL PROTEIN: 5.8 g/dL — AB (ref 6.5–8.1)

## 2017-08-20 LAB — CBC
HCT: 26.9 % — ABNORMAL LOW (ref 36.0–46.0)
HEMOGLOBIN: 8 g/dL — AB (ref 12.0–15.0)
MCH: 24.7 pg — AB (ref 26.0–34.0)
MCHC: 29.7 g/dL — ABNORMAL LOW (ref 30.0–36.0)
MCV: 83 fL (ref 78.0–100.0)
PLATELETS: 207 10*3/uL (ref 150–400)
RBC: 3.24 MIL/uL — AB (ref 3.87–5.11)
RDW: 18 % — ABNORMAL HIGH (ref 11.5–15.5)
WBC: 6.3 10*3/uL (ref 4.0–10.5)

## 2017-08-20 LAB — GLUCOSE, CAPILLARY
GLUCOSE-CAPILLARY: 168 mg/dL — AB (ref 65–99)
GLUCOSE-CAPILLARY: 126 mg/dL — AB (ref 65–99)
GLUCOSE-CAPILLARY: 101 mg/dL — AB (ref 65–99)

## 2017-08-20 MED ORDER — MAGNESIUM OXIDE 400 (241.3 MG) MG PO TABS: 400.0000 mg | ORAL_TABLET | Freq: Every day | ORAL | 0 refills | Status: DC

## 2017-08-20 MED ORDER — FUROSEMIDE 20 MG PO TABS: 20.0000 mg | ORAL_TABLET | Freq: Every day | ORAL | 0 refills | Status: DC

## 2017-08-20 NOTE — Progress Notes (Signed)
Pt ambulated in hallway, ra sat 98% after walk approx 100 ft, pt says she feels sob afterwards but no dyspnea or labored breathing noted, educated on pursed lip breathing, pt tolerated well

## 2017-08-20 NOTE — Discharge Summary (Signed)
Tonya Glass, is a 72 y.o. female  DOB 1946/01/22  MRN [id number].  Admission date:  08/18/2017  Admitting Physician  Rise Patience, MD  Discharge Date:  08/20/2017   Primary MD  Hayden Rasmussen, MD  Recommendations for primary care physician for things to follow:      Acute diastolic CHF with history of bioprosthetic aortic valve replacement last EF measured in April 2018 was 55-60% with grade 2 diastolic dysfunction,   Lasix increased from 20mg  po qod to 20mg  po qday  Hypertensive urgency Since having diuresis her bp has improved. Please monitor bp closely.   Hypothyroidism Cont levothyroxine  Copd Please obtain PFT with lung volume and DLCO as outpatient  Diabetes mellitus type 2 Resume home medication  Sick sinus syndrome with hx of complete heart block s/p pacer Stable  Hypokalemia Check bmp in 2 weeks-4 weeks  Hypomagnesemia Check magnesium in 2 weeks-4 weeks  Anemia Check cbc in 2-4 weeks     Admission Diagnosis  Congestive heart failure, unspecified HF chronicity, unspecified heart failure type (HCC) [I50.9] CHF (congestive heart failure) (HCC) [I50.9]   Discharge Diagnosis  Congestive heart failure, unspecified HF chronicity, unspecified heart failure type (HCC) [I50.9] CHF (congestive heart failure) (HCC) [I50.9]     Principal Problem:   Acute diastolic CHF (congestive heart failure) (HCC) Active Problems:   Essential hypertension   S/P aortic valve replacement with bioprosthetic valve   DM2 (diabetes mellitus, type 2) (HCC)   H/O aortic root repair   CAD (coronary artery disease)   Sick sinus syndrome (HCC)   Hypothyroidism   Acute CHF (congestive heart failure) (HCC)   CHF (congestive heart failure) ([place])      Past Medical History:  Diagnosis Date  . Anxiety   . Aortic valve stenosis 10/10/2016  . Arthritis    "fingers" (07/24/2017)  . CHF  (congestive heart failure) ([place])   . COPD (chronic obstructive pulmonary disease) ([place])   . Depression   . Gallstones   . Heart murmur   . High cholesterol   . History of kidney stones   . Hypertension   . Hypothyroidism   . Migraines    "none in years til 01/2013-02/2013; none since"  (07/24/2017)  . Orthopnoea    "just since open heart surgery" (02/25/2013)  . Pacemaker    [employer]   . Pneumonia ~ 2012   "walking pneumonia"  . Pneumonia 07/23/2017  . Stroke [facility]) 2001   denies residual on 02/25/2013  . Symptomatic hypotension 10/10/2016  . Syncope and collapse    "prior to open heart surgery" (02/25/2013)  . Type II diabetes mellitus ([place])     Past Surgical History:  Procedure Laterality Date  . BREAST SURGERY Right    "clamped blood vessel and stitched it up; in [place]"  . CARDIAC CATHETERIZATION    . CARDIAC VALVE REPLACEMENT  01/26/2013   "cow valve put in; widened the second valve" (02/25/2013) - [place], [place]  . CATARACT EXTRACTION W/ INTRAOCULAR  LENS  IMPLANT, BILATERAL Bilateral 04/2017  . CHOLECYSTECTOMY OPEN  1971  . CYST EXCISION     "taken off my chest"  . INSERT / REPLACE / REMOVE PACEMAKER  01/19/2013   [employer] - dual chamber  . [place] RESECTION  2009   in [place]  . VEIN SURGERY Left    LLE; cut leg open and took out piece of vein that was protruding thru skin; stitched it up; done in [place]       HPI  from the history and physical done on the day of admission:       72 y.o.femalewithhistory of bioprosthetic aortic valve replacement, hypertension, pacemaker placement, chronic anemia, diabetes mellitus who was recently admitted for pneumonia presents to the ER because of worsening shortness of breath and increasing lower extremity edema. Patient states since her discharge 2 weeks ago after being treated for pneumonia patient shortness of breath has worsened and unable to lie flat. Denies any chest pain productive  cough fever or chills. Has gained a lot of lower extremity edema but not has gained weight. Has been taking her Lasix as advised.  ED Course:In the ER on exam patient has elevated JVD and lower bilateral lower extremity edema. Chest x-ray was unremarkable. BNP was 1300. Patient was given IV Lasix and admitted for acute diastolic CHF.        Hospital Course:     Pt was admitted for CHF, lower ext edema. Even though CXR was negative for CHF, her BNP was elevated and she was clinically thought to have CHF.  Her breathing improved with diuresis.  Pt was initially started on Lasix 40mg  iv bid and transitioned to 20mg  po qday.  Pt had Cardiac echo 4/8=> EF 55-60%, mild to moderate regurgitation, and mild MR. Pt feels like her breathing is back to her baseline.  She has been walking up and down the hall without difficulty pt will be discharged to home.     Follow UP  Follow-up Information    Hayden Rasmussen, MD Follow up in 1 week(s).   Specialty:  Family Medicine Contact information: [address] [place] [id number] [phone]            Consults obtained - none  Discharge Condition: stable  Diet and Activity recommendation: See Discharge Instructions below  Discharge Instructions         Discharge Medications     Allergies as of 08/20/2017   No Known Allergies     Medication List    TAKE these medications   albuterol 108 (90 Base) MCG/ACT inhaler Commonly known as:  PROVENTIL HFA;VENTOLIN HFA Inhale 2 puffs into the lungs. Use as directed -  With exercise   alendronate 70 MG tablet Commonly known as:  FOSAMAX Take 70 mg by mouth every 7 (seven) days. SUNDAYS   ALPRAZolam 0.25 MG tablet Commonly known as:  XANAX Take 1 tablet (0.25 mg total) by mouth daily as needed for anxiety.   aspirin 325 MG tablet Take 325 mg by mouth daily.   atorvastatin 40 MG tablet Commonly known as:  LIPITOR Take 40 mg by mouth daily.   carvedilol  12.5 MG tablet Commonly known as:  COREG Take 1 tablet (12.5 mg total) 2 (two) times daily by mouth.   clonazePAM 1 MG tablet Commonly known as:  KLONOPIN Take 1 tablet (1 mg total) by mouth at bedtime.   cloNIDine 0.1 MG tablet Commonly known as:  CATAPRES Take  0.1 mg by mouth at bedtime.   FLUoxetine 20 MG capsule Commonly known as:  PROZAC Take 20 mg by mouth daily.   furosemide 20 MG tablet Commonly known as:  LASIX Take 1 tablet (20 mg total) by mouth daily. What changed:    how much to take  how to take this  when to take this  additional instructions   guaiFENesin 600 MG 12 hr tablet Commonly known as:  MUCINEX Take 1 tablet (600 mg total) by mouth 2 (two) times daily as needed for to loosen phlegm.   hydrocortisone 2.5 % cream Apply 1 application topically daily.   Iron 325 (65 Fe) MG Tabs Take 65 mg by mouth daily.   levothyroxine 50 MCG tablet Commonly known as:  SYNTHROID, LEVOTHROID Take 50 mcg by mouth daily before breakfast.   magnesium oxide 400 (241.3 Mg) MG tablet Commonly known as:  MAG-OX Take 1 tablet (400 mg total) by mouth daily. Start taking on:  08/21/2017   metFORMIN 500 MG tablet Commonly known as:  GLUCOPHAGE Take 1,000 mg by mouth 2 (two) times daily.   olmesartan 20 MG tablet Commonly known as:  BENICAR Take 0.5 tablets (10 mg total) by mouth daily.   PREPLUS 27-1 MG Tabs Take 1 tablet by mouth daily.   STIOLTO RESPIMAT 2.5-2.5 MCG/ACT Aers Generic drug:  Tiotropium Bromide-Olodaterol Inhale 2 puffs into the lungs daily.   temazepam 30 MG capsule Commonly known as:  RESTORIL Take 30 mg by mouth at bedtime.   traMADol 50 MG tablet Commonly known as:  ULTRAM Take 50 mg by mouth as needed for moderate pain.   Vitamin D (Ergocalciferol) 50000 units Caps capsule Commonly known as:  DRISDOL Take 50,000 Units by mouth every 7 (seven) days. Tuesday of each week       Major procedures and Radiology Reports - PLEASE  review detailed and final reports for all details, in brief -      Dg Chest 2 View  Result Date: 08/18/2017 CLINICAL DATA:  Short of breath for 3 days.  Edema. EXAM: CHEST - 2 VIEW COMPARISON:  07/26/2017 FINDINGS: Mild to moderate enlargement of the cardiopericardial silhouette. Stable changes from previous cardiac surgery and aortic valve replacement. No mediastinal or hilar masses. Left anterior chest wall sequential pacemaker is stable and well positioned. Lungs are hyperexpanded. Mildly prominent bronchovascular markings, decreased when compared to the prior radiographs. Apparent pleural based circumscribed mass, most likely posterior loculated fluid, inferiorly on lateral view. No free-flowing pleural effusion. No evidence of pneumonia. No convincing pulmonary edema. Skeletal structures are demineralized but grossly intact. IMPRESSION: 1. No acute findings.  No evidence of pneumonia or pulmonary edema. Electronically Signed   By: Lajean Manes M.D.   On: 08/18/2017 18:36   Dg Chest 2 View  Result Date: 07/26/2017 CLINICAL DATA:  Cough and shortness of breath. EXAM: CHEST - 2 VIEW COMPARISON:  CT chest dated July 24, 2017. Chest x-ray dated July 23, 2017. FINDINGS: Stable borderline cardiomegaly status post aortic valve replacement. Unchanged left chest wall pacemaker. Normal pulmonary vascularity. Improved consolidation in the right lung. Persistent loculated pleural effusion on the right, slightly decreased in size. Bibasilar atelectasis. No pneumothorax. No acute osseous abnormality. IMPRESSION: 1. Improved consolidation in the right lung. 2. Persistent loculated pleural effusion on the right, slightly decreased in size. Electronically Signed   By: Titus Dubin M.D.   On: 07/26/2017 09:57   Ct Chest Wo Contrast  Result Date: 07/24/2017 CLINICAL DATA:  Shortness of  breath, pneumonia. EXAM: CT CHEST WITHOUT CONTRAST TECHNIQUE: Multidetector CT imaging of the chest was performed following the  standard protocol without IV contrast. COMPARISON:  CT scan of June 14, 2017 and February 26, 2013. Radiograph of July 23, 2017. FINDINGS: Cardiovascular: Atherosclerosis of thoracic aorta is noted. Status post aortic valve replacement. 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 Left-sided pacemaker is seen in grossly good position. Mediastinum/Nodes: Thyroid gland is unremarkable. Small sliding-type hiatal hernia is noted. Stable mediastinal adenopathy is noted which most likely is reactive and benign in etiology. Lungs/Pleura: No pneumothorax is noted. Left lung is clear. Interval development of multifocal airspace opacities throughout the right lung most consistent with pneumonia. Moderate right lower lobe opacity is noted posteriorly consistent with atelectasis or pneumonia. Moderate loculated right pleural effusion is noted. Upper Abdomen: No acute abnormality. Musculoskeletal: No chest wall mass or suspicious bone lesions identified. IMPRESSION: Multifocal airspace opacities are noted throughout the right lung most consistent with multifocal pneumonia. Moderate right posterior basilar opacity is noted consistent with atelectasis or pneumonia, with associated moderate loculated right pleural effusion. Possibility of empyema cannot be excluded. Small sliding-type hiatal hernia. Stable mediastinal adenopathy is noted which most likely is reactive and benign in etiology. 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 Recommend semi-annual imaging followup by CTA or MRA and referral to cardiothoracic surgery if not already obtained. This recommendation follows 2010 ACCF/AHA/AATS/ACR/ASA/SCA/SCAI/SIR/STS/SVM Guidelines for the Diagnosis and Management of Patients With Thoracic Aortic Disease. Circulation. 2010; 121: e266-e36. Aortic Atherosclerosis (ICD10-I70.0). Electronically Signed   By: Marijo Conception, M.D.   On: 07/24/2017 13:06    Dg Chest Portable 1 View  Result Date: 07/23/2017 CLINICAL DATA:  Shortness of Breath EXAM: PORTABLE CHEST 1 VIEW COMPARISON:  Chest radiograph May 21, 2017 and chest CT June 14, 2017 FINDINGS: There is airspace consolidation throughout much the right lung with the greatest degree of concentration of consolidation in the right mid to lower lung regions. Left lung is clear. Heart is mildly enlarged with pulmonary vascularity within normal limits. Patient is status post aortic valve replacement. Pacemaker lead tips are attached to the right atrium and right ventricle. Patient is status post coronary artery bypass grafting. No adenopathy. No bone lesions. IMPRESSION: Extensive consolidation throughout much of the right lung consistent with multifocal pneumonia. Left lung clear. Stable cardiac prominence. Postoperative changes as noted. Electronically Signed   By: Lowella Grip III M.D.   On: 07/23/2017 08:09    Micro Results      No results found for this or any previous visit (from the past 240 hour(s)).     Today   Subjective    Vivia Rosenburg today states that her breathing is back to baseline.    N  o headache,no chest, No abdominal pain,no new weakness tingling or numbness, feels much better wants to go home today.      Objective   Blood pressure (!) 130/48, pulse 71, temperature 98 F (36.7 C), temperature source Oral, resp. rate 18, height 5\' 1"  (1.549 m), weight 76.1 kg (167 lb 12.8 oz), SpO2 98 %.   Intake/Output Summary (Last 24 hours) at 08/20/2017 1635 Last data filed at 08/20/2017 1000 Gross per 24 hour  Intake 540 ml  Output 1450 ml  Net -910 ml    Exam Awake Alert, Oriented x 3, No new F.N deficits, Normal affect [place].AT,PERRAL Supple Neck,No JVD, No cervical lymphadenopathy appriciated.  Symmetrical Chest wall movement, Good air  movement bilaterally, CTAB RRR, s1, s2, 2/6 dm rusb +ve B.Sounds, Abd Soft, Non tender, No organomegaly appriciated, No  rebound -guarding or rigidity. No Cyanosis, Clubbing or edema, No new Rash or bruise   Data Review   CBC w Diff:  Lab Results  Component Value Date   WBC 6.3 08/20/2017   HGB 8.0 (L) 08/20/2017   HCT 26.9 (L) 08/20/2017   PLT 207 08/20/2017   LYMPHOPCT 26 08/18/2017   MONOPCT 7 08/18/2017   EOSPCT 5 08/18/2017   BASOPCT 0 08/18/2017    CMP:  Lab Results  Component Value Date   NA 140 08/20/2017   NA 142 08/06/2016   K 3.6 08/20/2017   CL 101 08/20/2017   CO2 30 08/20/2017   BUN 18 08/20/2017   BUN 25 08/06/2016   CREATININE 0.97 08/20/2017   PROT 5.8 (L) 08/20/2017   ALBUMIN 3.5 08/20/2017   BILITOT 0.8 08/20/2017   ALKPHOS 59 08/20/2017   AST 18 08/20/2017   ALT 14 08/20/2017  .   Total Time in preparing paper work, data evaluation and todays exam - 43 minutes  Jani Gravel M.D on 08/20/2017 at 4:35 PM  [facility]   Office  [phone]

## 2017-08-20 NOTE — Progress Notes (Signed)
Patient ID: Tonya Glass, female   DOB: [birth year], 72 y.o.   MRN: [id number]                                                                PROGRESS NOTE                                                                                                                                                                                                             Patient Demographics:    Tonya Glass, is a 72 y.o. female, DOB - [birth year], WUJ:[id number]  Admit date - 08/18/2017   Admitting Physician Rise Patience, MD  Outpatient Primary MD for the patient is Hayden Rasmussen, MD  LOS - 2  Outpatient Specialists:     Chief Complaint  Patient presents with  . Shortness of Breath       Brief Narrative     72 y.o. female with history of bioprosthetic aortic valve replacement, hypertension, pacemaker placement, chronic anemia, diabetes mellitus who was recently admitted for pneumonia presents to the ER because of worsening shortness of breath and increasing lower extremity edema.  Patient states since her discharge 2 weeks ago after being treated for pneumonia patient shortness of breath has worsened and unable to lie flat.  Denies any chest pain productive cough fever or chills.  Has gained a lot of lower extremity edema but not has gained weight.  Has been taking her Lasix as advised.  ED Course: In the ER on exam patient has elevated JVD and lower bilateral lower extremity edema.  Chest x-ray was unremarkable.  BNP was 1300.  Patient was given IV Lasix and admitted for acute diastolic CHF.      Subjective:    Tonya Glass today is feeling slightly better in terms of sob.   No headache, No chest pain, No abdominal pain - No Nausea, No new weakness tingling or numbness, No Cough -   Assessment  & Plan :    Principal Problem:   Acute diastolic CHF (congestive heart failure) (HCC) Active Problems:   Essential hypertension   S/P aortic valve replacement with bioprosthetic valve   DM2 (diabetes mellitus, type 2) (HCC)   H/O aortic root repair   CAD (coronary artery disease)   Sick sinus syndrome (HCC)   Hypothyroidism  Acute CHF (congestive heart failure) (HCC)   CHF (congestive heart failure) ([place])    1. Acute diastolic CHF with history of bioprosthetic aortic valve replacement last EF measured in April 2018 was 55-60% with grade 2 diastolic dysfunction -patient was given IV Lasix in the ER and I have continued Lasix 40 mg every 12.  Follow intake output metabolic panel and daily weights. Echo pending 2. Hypertensive urgency -probably contributing to patient's symptoms.  I have ordered PRN IV hydralazine along with patient's home medications including Coreg, clonidine and ARB.  Blood pressure tends to remain high may have to increase her antihypertensive doses. 3. Hypothyroidism on Synthroid. 4. History of aortic valve replacement bioprosthetic check 2D echo. 5. Diabetes mellitus type 2 -placed on sliding scale coverage. 6. Sick sinus syndrome and history of complete heart block status post pacemaker placement. 7. COPD presently not actively wheezing on inhalers. 8. Chronic normocytic normochromic anemia -follow CBC. 9. Hypokalemia likely from diuretics.  start Mag oxide 400mg  po qday.  Replace and recheck.  Check magnesium levels.       Code Status : FULL CODE  Family Communication  : w patient  Disposition Plan  : home  Barriers For Discharge :   Consults  :  none  Procedures  :   DVT Prophylaxis  :  Lovenox - SCDs   Lab Results  Component Value Date   PLT 273 08/19/2017    Antibiotics  :  none  Anti-infectives (From admission, onward)   None        Objective:   Vitals:   08/19/17 1219 08/19/17 1651 08/19/17 2049 08/19/17 2301  BP: (!) 133/43 (!) 137/59 (!) 148/48 (!) 128/41  Pulse: 70 70 70 68  Resp: 18 18 18 18   Temp: 98.4 F (36.9 C) 98.6 F (37 C) 98.2 F (36.8 C)   TempSrc: Oral Oral Oral   SpO2: 100% 100% 100% 93%    Weight:      Height:        Wt Readings from Last 3 Encounters:  08/19/17 76 kg (167 lb 9.6 oz)  07/25/17 85 kg (187 lb 4.8 oz)  03/22/17 77.6 kg (171 lb)     Intake/Output Summary (Last 24 hours) at 08/20/2017 0551 Last data filed at 08/19/2017 2226 Gross per 24 hour  Intake 770 ml  Output 1050 ml  Net -280 ml     Physical Exam  Awake Alert, Oriented X 3, No new F.N deficits, Normal affect [place].AT,PERRAL Supple Neck,No JVD, No cervical lymphadenopathy appriciated.  Symmetrical Chest wall movement, Good air movement bilaterally, CTAB RRR,No Gallops,Rubs or new Murmurs, No Parasternal Heave +ve B.Sounds, Abd Soft, No tenderness, No organomegaly appriciated, No rebound - guarding or rigidity. No Cyanosis, Clubbing or edema, No new Rash or bruise      Data Review:    CBC Recent Labs  Lab 08/18/17 1847 08/18/17 1858 08/19/17 0451  WBC 5.9  --  7.0  HGB 8.4* 9.2* 8.6*  HCT 27.0* 27.0* 28.0*  PLT 236  --  273  MCV 82.6  --  82.4  MCH 25.7*  --  25.3*  MCHC 31.1  --  30.7  RDW 17.5*  --  17.6*  LYMPHSABS 1.5  --   --   MONOABS 0.4  --   --   EOSABS 0.3  --   --   BASOSABS 0.0  --   --     Chemistries  Recent Labs  Lab 08/18/17 1847 08/18/17 1858 08/18/17  2105 08/19/17 0451  NA 142 143  --  143  K 2.8* 2.8*  --  3.2*  CL 103 101  --  100*  CO2 26  --   --  31  GLUCOSE 108* 110*  --  133*  BUN 12 12  --  11  CREATININE 0.69 0.70  --  0.80  CALCIUM 8.9  --   --  9.0  MG  --   --  1.4* 1.4*  AST 23  --   --   --   ALT 16  --   --   --   ALKPHOS 59  --   --   --   BILITOT 1.3*  --   --   --    ------------------------------------------------------------------------------------------------------------------ No results for input(s): CHOL, HDL, LDLCALC, TRIG, CHOLHDL, LDLDIRECT in the last 72 hours.  Lab Results  Component Value Date   HGBA1C 5.6 07/23/2017    ------------------------------------------------------------------------------------------------------------------ No results for input(s): TSH, T4TOTAL, T3FREE, THYROIDAB in the last 72 hours.  Invalid input(s): FREET3 ------------------------------------------------------------------------------------------------------------------ No results for input(s): VITAMINB12, FOLATE, FERRITIN, TIBC, IRON, RETICCTPCT in the last 72 hours.  Coagulation profile No results for input(s): INR, PROTIME in the last 168 hours.  No results for input(s): DDIMER in the last 72 hours.  Cardiac Enzymes Recent Labs  Lab 08/18/17 1847  TROPONINI <0.03   ------------------------------------------------------------------------------------------------------------------    Component Value Date/Time   BNP 1,354.3 (H) 08/18/2017 1847    Inpatient Medications  Scheduled Meds: . aspirin  325 mg Oral Daily  . atorvastatin  40 mg Oral Daily  . carvedilol  12.5 mg Oral BID WC  . clonazePAM  1 mg Oral QHS  . cloNIDine  0.1 mg Oral QHS  . enoxaparin (LOVENOX) injection  40 mg Subcutaneous Q24H  . ferrous sulfate  325 mg Oral Daily  . FLUoxetine  20 mg Oral Daily  . furosemide  40 mg Intravenous Q12H  . insulin aspart  0-9 Units Subcutaneous TID WC  . irbesartan  150 mg Oral Daily  . levothyroxine  50 mcg Oral QAC breakfast  . magnesium oxide  400 mg Oral Daily  . prenatal vitamin w/FE, FA  1 tablet Oral Daily  . temazepam  30 mg Oral QHS   Continuous Infusions: PRN Meds:.acetaminophen **OR** acetaminophen, albuterol, ALPRAZolam, guaiFENesin, hydrALAZINE, ondansetron **OR** ondansetron (ZOFRAN) IV, traMADol  Micro Results No results found for this or any previous visit (from the past 240 hour(s)).  Radiology Reports Dg Chest 2 View  Result Date: 08/18/2017 CLINICAL DATA:  Short of breath for 3 days.  Edema. EXAM: CHEST - 2 VIEW COMPARISON:  07/26/2017 FINDINGS: Mild to moderate enlargement of the  cardiopericardial silhouette. Stable changes from previous cardiac surgery and aortic valve replacement. No mediastinal or hilar masses. Left anterior chest wall sequential pacemaker is stable and well positioned. Lungs are hyperexpanded. Mildly prominent bronchovascular markings, decreased when compared to the prior radiographs. Apparent pleural based circumscribed mass, most likely posterior loculated fluid, inferiorly on lateral view. No free-flowing pleural effusion. No evidence of pneumonia. No convincing pulmonary edema. Skeletal structures are demineralized but grossly intact. IMPRESSION: 1. No acute findings.  No evidence of pneumonia or pulmonary edema. Electronically Signed   By: Lajean Manes M.D.   On: 08/18/2017 18:36   Dg Chest 2 View  Result Date: 07/26/2017 CLINICAL DATA:  Cough and shortness of breath. EXAM: CHEST - 2 VIEW COMPARISON:  CT chest dated July 24, 2017. Chest x-ray dated July 23, 2017. FINDINGS:  Stable borderline cardiomegaly status post aortic valve replacement. Unchanged left chest wall pacemaker. Normal pulmonary vascularity. Improved consolidation in the right lung. Persistent loculated pleural effusion on the right, slightly decreased in size. Bibasilar atelectasis. No pneumothorax. No acute osseous abnormality. IMPRESSION: 1. Improved consolidation in the right lung. 2. Persistent loculated pleural effusion on the right, slightly decreased in size. Electronically Signed   By: Titus Dubin M.D.   On: 07/26/2017 09:57   Ct Chest Wo Contrast  Result Date: 07/24/2017 CLINICAL DATA:  Shortness of breath, pneumonia. EXAM: CT CHEST WITHOUT CONTRAST TECHNIQUE: Multidetector CT imaging of the chest was performed following the standard protocol without IV contrast. COMPARISON:  CT scan of June 14, 2017 and February 26, 2013. Radiograph of July 23, 2017. FINDINGS: Cardiovascular: Atherosclerosis of thoracic aorta is noted. Status post aortic valve replacement. 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 Left-sided pacemaker is seen in grossly good position. Mediastinum/Nodes: Thyroid gland is unremarkable. Small sliding-type hiatal hernia is noted. Stable mediastinal adenopathy is noted which most likely is reactive and benign in etiology. Lungs/Pleura: No pneumothorax is noted. Left lung is clear. Interval development of multifocal airspace opacities throughout the right lung most consistent with pneumonia. Moderate right lower lobe opacity is noted posteriorly consistent with atelectasis or pneumonia. Moderate loculated right pleural effusion is noted. Upper Abdomen: No acute abnormality. Musculoskeletal: No chest wall mass or suspicious bone lesions identified. IMPRESSION: Multifocal airspace opacities are noted throughout the right lung most consistent with multifocal pneumonia. Moderate right posterior basilar opacity is noted consistent with atelectasis or pneumonia, with associated moderate loculated right pleural effusion. Possibility of empyema cannot be excluded. Small sliding-type hiatal hernia. Stable mediastinal adenopathy is noted which most likely is reactive and benign in etiology. 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 Recommend semi-annual imaging followup by CTA or MRA and referral to cardiothoracic surgery if not already obtained. This recommendation follows 2010 ACCF/AHA/AATS/ACR/ASA/SCA/SCAI/SIR/STS/SVM Guidelines for the Diagnosis and Management of Patients With Thoracic Aortic Disease. Circulation. 2010; 121: e266-e36. Aortic Atherosclerosis (ICD10-I70.0). Electronically Signed   By: Marijo Conception, M.D.   On: 07/24/2017 13:06   Dg Chest Portable 1 View  Result Date: 07/23/2017 CLINICAL DATA:  Shortness of Breath EXAM: PORTABLE CHEST 1 VIEW COMPARISON:  Chest radiograph May 21, 2017 and chest CT June 14, 2017 FINDINGS: There is airspace consolidation throughout much the  right lung with the greatest degree of concentration of consolidation in the right mid to lower lung regions. Left lung is clear. Heart is mildly enlarged with pulmonary vascularity within normal limits. Patient is status post aortic valve replacement. Pacemaker lead tips are attached to the right atrium and right ventricle. Patient is status post coronary artery bypass grafting. No adenopathy. No bone lesions. IMPRESSION: Extensive consolidation throughout much of the right lung consistent with multifocal pneumonia. Left lung clear. Stable cardiac prominence. Postoperative changes as noted. Electronically Signed   By: Lowella Grip III M.D.   On: 07/23/2017 08:09    Time Spent in minutes  30   Jani Gravel M.D on 08/19/2017  Between 7am to 7pm - Pager [phone]    After 7pm go to [email or web address] - password [email or web address]  [facility] -  Office  [phone]

## 2017-08-20 NOTE — Progress Notes (Signed)
Dc instructions reviewed, hf booklet reviewed, all questions entertained and answered

## 2017-08-20 NOTE — Progress Notes (Signed)
Patient ID: Tonya Glass, female   DOB: 10-30-1945, 72 y.o.   MRN: [id number]                                                                PROGRESS NOTE                                                                                                                                                                                                             Patient Demographics:    Tonya Glass, is a 72 y.o. female, DOB - 14-Jul-1945, KDX:[id number]  Admit date - 08/18/2017   Admitting Physician Rise Patience, MD  Outpatient Primary MD for the patient is Hayden Rasmussen, MD  LOS - 2  Outpatient Specialists:     Chief Complaint  Patient presents with  . Shortness of Breath       Brief Narrative     72 y.o.femalewithhistory of bioprosthetic aortic valve replacement, hypertension, pacemaker placement, chronic anemia, diabetes mellitus who was recently admitted for pneumonia presents to the ER because of worsening shortness of breath and increasing lower extremity edema. Patient states since her discharge 2 weeks ago after being treated for pneumonia patient shortness of breath has worsened and unable to lie flat. Denies any chest pain productive cough fever or chills. Has gained a lot of lower extremity edema but not has gained weight. Has been taking her Lasix as advised.  ED Course:In the ER on exam patient has elevated JVD and lower bilateral lower extremity edema. Chest x-ray was unremarkable. BNP was 1300. Patient was given IV Lasix and admitted for acute diastolic CHF.       Subjective:    Tonya Glass today is almost feeling back to her baseline in terms of dyspnea and lower ext edema. Pt denies fever, chills, cp, palp, n/v, diarrhea, brbpr.    No headache, No abdominal pain - No Nausea, No new weakness tingling or numbness,    Assessment  & Plan :    Principal Problem:   Acute diastolic CHF (congestive heart failure) (HCC) Active Problems:  Essential hypertension   S/P aortic valve replacement with bioprosthetic valve   DM2 (diabetes mellitus, type 2) (HCC)   H/O aortic root repair   CAD (coronary artery disease)   Sick sinus syndrome ([place])  Hypothyroidism   Acute CHF (congestive heart failure) (HCC)   CHF (congestive heart failure) ([place])      1. Acute diastolic CHF with history of bioprosthetic aortic valve replacement last EF measured in April 2018 was 55-60% with grade 2 diastolic dysfunction-patient was given IV Lasix in the ER and I have continued Lasix 40 mg every 12. Follow intake output metabolic panel and daily weights. Echo pending 2. Hypertensive urgency-probably contributing to patient's symptoms. I have ordered PRN IV hydralazine along with patient's home medications including Coreg, clonidine and ARB. Blood pressure tends to remain high may have to increase her antihypertensive doses. 3. Hypothyroidism on Synthroid. 4. History of aortic valve replacement bioprosthetic check 2D echo. 5. Diabetes mellitus type 2-placed on sliding scale coverage. 6. Sick sinus syndrome and history of complete heart block status post pacemaker placement. 7. COPD presently not actively wheezing on inhalers. 8. Chronic normocytic normochromic anemia-follow CBC. 9. Hypokalemia likely from diuretics. start Mag oxide 400mg  po qday.  Replace and recheck. Check magnesium levels.        Code Status : FULL CODE  Family Communication  : w patient  Disposition Plan  : home  Barriers For Discharge :   Consults  :  none  Procedures  :   DVT Prophylaxis  :  Lovenox - SCDs   RecentLabs       Lab Results  Component Value Date   PLT 273 08/19/2017      Antibiotics  :  none     Lab Results  Component Value Date   PLT 273 08/19/2017      Objective:   Vitals:   08/19/17 1651 08/19/17 2049 08/19/17 2301 08/20/17 0602  BP: (!) 137/59 (!) 148/48 (!) 128/41 (!) 143/50  Pulse: 70 70 68 70    Resp: 18 18 18 17   Temp: 98.6 F (37 C) 98.2 F (36.8 C)  (!) 97.5 F (36.4 C)  TempSrc: Oral Oral  Oral  SpO2: 100% 100% 93% 96%  Weight:    76.1 kg (167 lb 12.8 oz)  Height:        Wt Readings from Last 3 Encounters:  08/20/17 76.1 kg (167 lb 12.8 oz)  07/25/17 85 kg (187 lb 4.8 oz)  03/22/17 77.6 kg (171 lb)     Intake/Output Summary (Last 24 hours) at 08/20/2017 0614 Last data filed at 08/20/2017 0608 Gross per 24 hour  Intake 770 ml  Output 1550 ml  Net -780 ml     Physical Exam  Awake Alert, Oriented X 3, No new F.N deficits, Normal affect [place].AT,PERRAL Supple Neck,No JVD, No cervical lymphadenopathy appriciated.  Symmetrical Chest wall movement, Good air movement bilaterally, crackles right lung base, no wheezing RRR, s1, s2, 2/6 sem rusb +ve B.Sounds, Abd Soft, No tenderness, No organomegaly appriciated, No rebound - guarding or rigidity. No Cyanosis, Clubbing or edema, No new Rash or bruise      Data Review:    CBC Recent Labs  Lab 08/18/17 1847 08/18/17 1858 08/19/17 0451  WBC 5.9  --  7.0  HGB 8.4* 9.2* 8.6*  HCT 27.0* 27.0* 28.0*  PLT 236  --  273  MCV 82.6  --  82.4  MCH 25.7*  --  25.3*  MCHC 31.1  --  30.7  RDW 17.5*  --  17.6*  LYMPHSABS 1.5  --   --   MONOABS 0.4  --   --   EOSABS 0.3  --   --   BASOSABS 0.0  --   --  Chemistries  Recent Labs  Lab 08/18/17 1847 08/18/17 1858 08/18/17 2105 08/19/17 0451  NA 142 143  --  143  K 2.8* 2.8*  --  3.2*  CL 103 101  --  100*  CO2 26  --   --  31  GLUCOSE 108* 110*  --  133*  BUN 12 12  --  11  CREATININE 0.69 0.70  --  0.80  CALCIUM 8.9  --   --  9.0  MG  --   --  1.4* 1.4*  AST 23  --   --   --   ALT 16  --   --   --   ALKPHOS 59  --   --   --   BILITOT 1.3*  --   --   --    ------------------------------------------------------------------------------------------------------------------ No results for input(s): CHOL, HDL, LDLCALC, TRIG, CHOLHDL, LDLDIRECT in the last 72  hours.  Lab Results  Component Value Date   HGBA1C 5.6 07/23/2017   ------------------------------------------------------------------------------------------------------------------ No results for input(s): TSH, T4TOTAL, T3FREE, THYROIDAB in the last 72 hours.  Invalid input(s): FREET3 ------------------------------------------------------------------------------------------------------------------ No results for input(s): VITAMINB12, FOLATE, FERRITIN, TIBC, IRON, RETICCTPCT in the last 72 hours.  Coagulation profile No results for input(s): INR, PROTIME in the last 168 hours.  No results for input(s): DDIMER in the last 72 hours.  Cardiac Enzymes Recent Labs  Lab 08/18/17 1847  TROPONINI <0.03   ------------------------------------------------------------------------------------------------------------------    Component Value Date/Time   BNP 1,354.3 (H) 08/18/2017 1847    Inpatient Medications  Scheduled Meds: . aspirin  325 mg Oral Daily  . atorvastatin  40 mg Oral Daily  . carvedilol  12.5 mg Oral BID WC  . clonazePAM  1 mg Oral QHS  . cloNIDine  0.1 mg Oral QHS  . enoxaparin (LOVENOX) injection  40 mg Subcutaneous Q24H  . ferrous sulfate  325 mg Oral Daily  . FLUoxetine  20 mg Oral Daily  . furosemide  40 mg Intravenous Q12H  . insulin aspart  0-9 Units Subcutaneous TID WC  . irbesartan  150 mg Oral Daily  . levothyroxine  50 mcg Oral QAC breakfast  . magnesium oxide  400 mg Oral Daily  . prenatal vitamin w/FE, FA  1 tablet Oral Daily  . temazepam  30 mg Oral QHS   Continuous Infusions: PRN Meds:.acetaminophen **OR** acetaminophen, albuterol, ALPRAZolam, guaiFENesin, hydrALAZINE, ondansetron **OR** ondansetron (ZOFRAN) IV, traMADol  Micro Results No results found for this or any previous visit (from the past 240 hour(s)).  Radiology Reports Dg Chest 2 View  Result Date: 08/18/2017 CLINICAL DATA:  Short of breath for 3 days.  Edema. EXAM: CHEST - 2 VIEW  COMPARISON:  07/26/2017 FINDINGS: Mild to moderate enlargement of the cardiopericardial silhouette. Stable changes from previous cardiac surgery and aortic valve replacement. No mediastinal or hilar masses. Left anterior chest wall sequential pacemaker is stable and well positioned. Lungs are hyperexpanded. Mildly prominent bronchovascular markings, decreased when compared to the prior radiographs. Apparent pleural based circumscribed mass, most likely posterior loculated fluid, inferiorly on lateral view. No free-flowing pleural effusion. No evidence of pneumonia. No convincing pulmonary edema. Skeletal structures are demineralized but grossly intact. IMPRESSION: 1. No acute findings.  No evidence of pneumonia or pulmonary edema. Electronically Signed   By: Lajean Manes M.D.   On: 08/18/2017 18:36   Dg Chest 2 View  Result Date: 07/26/2017 CLINICAL DATA:  Cough and shortness of breath. EXAM: CHEST - 2 VIEW COMPARISON:  CT chest  dated July 24, 2017. Chest x-ray dated July 23, 2017. FINDINGS: Stable borderline cardiomegaly status post aortic valve replacement. Unchanged left chest wall pacemaker. Normal pulmonary vascularity. Improved consolidation in the right lung. Persistent loculated pleural effusion on the right, slightly decreased in size. Bibasilar atelectasis. No pneumothorax. No acute osseous abnormality. IMPRESSION: 1. Improved consolidation in the right lung. 2. Persistent loculated pleural effusion on the right, slightly decreased in size. Electronically Signed   By: Titus Dubin M.D.   On: 07/26/2017 09:57   Ct Chest Wo Contrast  Result Date: 07/24/2017 CLINICAL DATA:  Shortness of breath, pneumonia. EXAM: CT CHEST WITHOUT CONTRAST TECHNIQUE: Multidetector CT imaging of the chest was performed following the standard protocol without IV contrast. COMPARISON:  CT scan of June 14, 2017 and February 26, 2013. Radiograph of July 23, 2017. FINDINGS: Cardiovascular: Atherosclerosis of thoracic  aorta is noted. Status post aortic valve replacement. 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 Left-sided pacemaker is seen in grossly good position. Mediastinum/Nodes: Thyroid gland is unremarkable. Small sliding-type hiatal hernia is noted. Stable mediastinal adenopathy is noted which most likely is reactive and benign in etiology. Lungs/Pleura: No pneumothorax is noted. Left lung is clear. Interval development of multifocal airspace opacities throughout the right lung most consistent with pneumonia. Moderate right lower lobe opacity is noted posteriorly consistent with atelectasis or pneumonia. Moderate loculated right pleural effusion is noted. Upper Abdomen: No acute abnormality. Musculoskeletal: No chest wall mass or suspicious bone lesions identified. IMPRESSION: Multifocal airspace opacities are noted throughout the right lung most consistent with multifocal pneumonia. Moderate right posterior basilar opacity is noted consistent with atelectasis or pneumonia, with associated moderate loculated right pleural effusion. Possibility of empyema cannot be excluded. Small sliding-type hiatal hernia. Stable mediastinal adenopathy is noted which most likely is reactive and benign in etiology. 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 Recommend semi-annual imaging followup by CTA or MRA and referral to cardiothoracic surgery if not already obtained. This recommendation follows 2010 ACCF/AHA/AATS/ACR/ASA/SCA/SCAI/SIR/STS/SVM Guidelines for the Diagnosis and Management of Patients With Thoracic Aortic Disease. Circulation. 2010; 121: e266-e36. Aortic Atherosclerosis (ICD10-I70.0). Electronically Signed   By: Marijo Conception, M.D.   On: 07/24/2017 13:06   Dg Chest Portable 1 View  Result Date: 07/23/2017 CLINICAL DATA:  Shortness of Breath EXAM: PORTABLE CHEST 1 VIEW COMPARISON:  Chest radiograph May 21, 2017 and chest CT June 14, 2017 FINDINGS: There is airspace consolidation throughout much the right lung with the greatest degree of concentration of consolidation in the right mid to lower lung regions. Left lung is clear. Heart is mildly enlarged with pulmonary vascularity within normal limits. Patient is status post aortic valve replacement. Pacemaker lead tips are attached to the right atrium and right ventricle. Patient is status post coronary artery bypass grafting. No adenopathy. No bone lesions. IMPRESSION: Extensive consolidation throughout much of the right lung consistent with multifocal pneumonia. Left lung clear. Stable cardiac prominence. Postoperative changes as noted. Electronically Signed   By: Lowella Grip III M.D.   On: 07/23/2017 08:09    Time Spent in minutes  30   Jani Gravel M.D on 08/20/2017 at 6:14 AM  Between 7am to 7pm - Pager - [phone]   After 7pm go to [email or web address] - password [facility]  [facility] -  Office  [phone]

## 2017-08-20 NOTE — Plan of Care (Signed)
Nutrition Education Note  RD consulted for nutrition education regarding CHF.  Lab Results  Component Value Date   HGBA1C 5.6 07/23/2017    Spoke with pt at bedside, who reports that she limits carbohydrates and consumes many small meals throughout the day, due to undergoing a gastric sleeve approximately 20 years ago. She is proud of her good glycemic control (PTA DM medications 500 mg metformin BID).   Pt reports that she does not cook often at home and expressed frustration regarding high sodium contents of convenience foods. Pt usually eats brunch (rye bread, cereal, coffee, eggs, and sausage patty) and has many snacks throughout the day, such as fruit, popcorn, and lorna doone cookies. Pt reports she usually goes out for an evening meal and will select take-out items such as fried chicken and chinese food, but consume small portions of meal over the next 3-4 days. She recently has tried to incorporate a banana, orange, and grapes into her diet daily to help raise her potassium levels per recommendation of her PCP.   Reviewed sodium contents of commonly consumed foods. Discussed specific examples of small, easy to prepare meals that pt could consume at home that were lower in sodium. The majority of visit was spend encouraging pt to make small changes to help decrease sodium intake over time, such as popping her own popcorn, consuming fresh fruits and vegetables as snacks, and preparing meals and snacks in advance to prevent relying on take out food so often. Provided pt with emotional support to continue to make small changes. Pt expressed appreciation for visit.    RD provided "Low Sodium Nutrition Therapy" handout from the Academy of [employer]. Reviewed patient's dietary recall. Provided examples on ways to decrease sodium intake in diet. Discouraged intake of processed foods and use of salt shaker. Encouraged fresh fruits and vegetables as well as whole grain sources of  carbohydrates to maximize fiber intake.   RD discussed why it is important for patient to adhere to diet recommendations, and emphasized the role of fluids, foods to avoid, and importance of weighing self daily. Teach back method used.  Expect fair  compliance.  Body mass index is 31.71 kg/m. Pt meets criteria for obesity, class I based on current BMI.  Current diet order is Heart Healthy/ Carb Modified, patient is consuming approximately 75-100% of meals at this time. Labs and medications reviewed. No further nutrition interventions warranted at this time. RD contact information provided. If additional nutrition issues arise, please re-consult RD.   Manpreet Kemmer A. Jimmye Norman, RD, LDN, CDE Pager: [phone] After hours Pager: [phone]

## 2017-08-21 ENCOUNTER — Telehealth: Payer: Self-pay | Admitting: Internal Medicine

## 2017-08-21 NOTE — Telephone Encounter (Signed)
Returned call to patient she stated she was recently in [place] with CHF.Stated she is feeling much better.She was calling to ask Dr.Hilty if she still needs to keep appointment with him 5/8.Advised I will send message to him.

## 2017-08-21 NOTE — Telephone Encounter (Signed)
Mrs. Minium is calling to let Dr. Debara Pickett know that she was in the hospital for CHF and has the Echo done while in the hospital. Also wants to know should she keep the upcoming appt w/ Dr. Debara Pickett . Please call

## 2017-08-23 NOTE — Telephone Encounter (Signed)
Follow up    Patient called she wants Dr Debara Pickett to know that she had been admitted to [facility] and That she had an echo done there as well.

## 2017-08-24 NOTE — Telephone Encounter (Signed)
I am sure that he wants her to keep the f/u appt MCr

## 2017-08-26 NOTE — Telephone Encounter (Signed)
Left detailed message for patient to keep her 5/8 appointment with MD to review hospital echo & hospital course

## 2017-09-06 ENCOUNTER — Other Ambulatory Visit (HOSPITAL_COMMUNITY): Payer: Medicare Other

## 2017-09-17 ENCOUNTER — Ambulatory Visit (INDEPENDENT_AMBULATORY_CARE_PROVIDER_SITE_OTHER): Payer: Medicare Other | Admitting: *Deleted

## 2017-09-17 DIAGNOSIS — I495 Sick sinus syndrome: Secondary | ICD-10-CM

## 2017-09-17 NOTE — Progress Notes (Signed)
Remote pacemaker transmission.   

## 2017-09-18 ENCOUNTER — Encounter: Payer: Self-pay | Admitting: Internal Medicine

## 2017-09-18 ENCOUNTER — Ambulatory Visit (INDEPENDENT_AMBULATORY_CARE_PROVIDER_SITE_OTHER): Payer: Medicare Other | Admitting: Internal Medicine

## 2017-09-18 ENCOUNTER — Telehealth: Payer: Self-pay | Admitting: Internal Medicine

## 2017-09-18 VITALS — BP 155/72 | HR 72 | Ht 61.0 in | Wt 167.6 lb

## 2017-09-18 DIAGNOSIS — I350 Nonrheumatic aortic (valve) stenosis: Secondary | ICD-10-CM | POA: Diagnosis not present

## 2017-09-18 DIAGNOSIS — I5031 Acute diastolic (congestive) heart failure: Secondary | ICD-10-CM | POA: Diagnosis not present

## 2017-09-18 DIAGNOSIS — Z952 Presence of prosthetic heart valve: Secondary | ICD-10-CM

## 2017-09-18 DIAGNOSIS — I10 Essential (primary) hypertension: Secondary | ICD-10-CM

## 2017-09-18 MED ORDER — CLONIDINE HCL 0.2 MG PO TABS: 0.2000 mg | ORAL_TABLET | Freq: Every day | ORAL | 11 refills | Status: DC

## 2017-09-18 NOTE — Telephone Encounter (Signed)
Left detailed VM per DPR - Will move remote check to 8/12 per her request.

## 2017-09-18 NOTE — Telephone Encounter (Signed)
New Message:       Pt is calling to reschedule her home remote check on 8/6. [place] and would like to do it on or after 8/12

## 2017-09-18 NOTE — Patient Instructions (Signed)
Your physician has recommended you make the following change in your medication: INCREASE clonidine to 0.2mg  before bedtime  Your physician wants you to follow-up in: ONE YEAR with Dr. Debara Pickett. You will receive a reminder letter in the mail two months in advance. If you don't receive a letter, please call our office to schedule the follow-up appointment.

## 2017-09-18 NOTE — Progress Notes (Signed)
OFFICE NOTE  Chief Complaint:  Follow-up hospitalization  Primary Care Physician: Hayden Rasmussen, MD  HPI:  Tonya Glass is a pleasant 72 year old female who recently relocated to [place]. She has been living for the past 26 years in [place], where she moved with her husband who is an Training and development officer. 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 For some reason she was being followed by a doctor in [place] who recommended referral to a cardiologist for episodes of dizziness that she was experiencing. Ultimately she saw doctors in [facility] and apparently underwent placement of a pacemaker and at some point had cardiac surgery. She initially reported that she had 2 valves replaced, however subsequently said that she was told that she had a blood vessel that was go "going to burst".  This sounds somewhat like an aneurysm and I wonder if she had a Bentall procedure.  We are still trying to obtain these records. She does have a history of gastric sleeve procedure in 2011 for which she lost weight coming from 330 pounds down to 142 pounds. She also made significant dietary changes. Postoperatively she was quite short of breath and required some recovery and therefore is in [place] living with her children.  [name] returns today for followup. Since seeing her last, she has been seen by her primary care provider about 3 times for adjustment of her medications. She sees to be having problems with low blood pressure with diastolics in the 78E and 42P. Systolic to been in the 536-144 range. He stopped to her blood pressure medications. Today in the office her blood pressure is elevated at 179/73. She is continuing to take carvedilol 6.25 mg twice daily.  I had the pleasure seeing [name] back in the office today. Overall she is doing well denies any chest pain or worsening shortness of breath. She's had no issues regarding her pacemaker which is remotely  monitor. She has a an appointment scheduled to see Dr. Loletha Grayer next month. She had repeat echocardiography 2 years ago to assess her valve which is a 23 mm MagnaEase aortic bioprosthetic. She also had concomitant aortic root replacement at the time. Blood pressure appears well controlled on current regimen.  09/16/2015  [name] returns for follow-up. She was seen last year at which time I made some adjustments on her medications. She was on both ACE and ARB and I discontinued her 81s. I increased her ARB slightly and blood pressures remain stable. Clinically she is asymptomatic, denying any chest pain or worsening shortness of breath. I recommended an echocardiogram at her last office visit but that was never performed. Recently she had lab work through her primary care provider which I was able to view on care everywhere. Cholesterol was well controlled with LDL around 70. Blood pressure is again is at goal. Her pacemaker is being followed by remote checks and she has appointment with Dr. Loletha Grayer in June for further evaluation. EKG today shows an atrial paced rhythm at 75.  08/02/2016  [name] was referred today for follow-up after being seen by her care provider 2 days ago for acute shortness of breath thought to be systolic congestive heart failure. She reports she recently got back from a trip to [place]. While she was there she thought she had an upper respiratory infection as she was more short of breath but denied any fever or cough or chills. After returning she saw her primary care provider  and he ran tests including a pro-BNP which was elevated greater than 4000. A chest x-ray was performed which did not show any overt pulmonary edema. She was started on Lasix 20 mg daily which is taken for a couple of days and has been urinating frequently. She reports her breathing has improved. She takes his symptoms have come on fairly suddenly, but that she was not concerned because she's been short of  breath to some degree for many years. Weight is decreased from 174-169 over the past couple of days.  08/28/2016  [name] was seen today in follow-up. She reports marked improvement in her breathing with good diuresis in the 6 pound weight loss. She's also made dietary changes and is working on losing weight. She's decreased her Lasix back to 20 mg every other day after her weight loss as her breathing is improved and lower extremity swelling is improved. We repeated her echocardiogram which unfortunate shows some worsening of her bioprosthetic aortic valve. There is now moderate stenosis with mild to moderate regurgitation. She does have a widened pulse pressure with a diastolic blood pressure in the 50s and sometimes the 40s. We'll need to watch his closely and consider decreasing her blood pressure medication if necessary. Otherwise she feels excellent today.  10/10/2016  [name] returns today for follow-up. She actually is here for an urgent visit as over the past several weeks she's felt significantly fatigued. When I saw her last month she was doing well and had improved with diuretics. 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 This will need to be reevaluated by echo in 1 year. She subsequently went on her trip out to [facility] and when she returned felt worsening fatigue. Today she is noted to be hypotensive with a blood pressure of 98/40. I reviewed home blood pressure readings which show a trend of decreasing blood pressures mostly in the 90s over 40s. Some systolic blood pressures in the 80s. This is likely the cause of her fatigue.  01/04/2017  [name] was seen today in follow-up. She had been feeling some fatigue and dizziness. Blood pressures were somewhat low, particularly her diastolic blood pressures in the 40s. Her last echo showed mild to moderate aortic regurgitation and mild to  moderate stenosis which was slightly worse. This is suggesting that the valve may be deteriorating somewhat. Because of her aortic regurgitation there is a wide pulse pressure. Her pulse pressure is close to 100 mmHg. This makes it challenging to aggressively manage her systolic blood pressure without diastolic hypotension. I recommended discontinuing her amlodipine and she reports an improvement in her symptoms with diastolic blood pressures now in the 95A, but her systolic pressures have generally range from the 140s to 150s.  09/18/2017  Mrs. Cihlar was seen today in follow-up.  She reports being hospitalized twice this spring.  Once for pneumonia and the second time for acute diastolic congestive heart failure.  She was not seen by cardiology, rather she was diuresed with IV Lasix since switched to oral Lasix 20 mg daily rather than every other day.  She did report increased use of salt recently and has made significant dietary changes.  Her blood pressure also has been somewhat uncontrolled.  She was started on clonidine 0.1 mg at night and recently had been on telmisartan.  The dose was increased from 10 mg to 20 mg daily a few days ago and her daytime  blood pressures appear to be in the 382N to 053Z systolic.  She reported some early morning blood pressure elevations in 150s to 170s.  At discharge she reports no worsening swelling.  Weight is been stable at 167 pounds.  She denies chest pain or worsening shortness of breath.  An echo was performed during the hospitalization which showed stable LVEF 55 to 60%.  The gradient across her bioprosthetic aortic valve was increased, however stable compared to a gradient in 2018.  PMHx:  Past Medical History:  Diagnosis Date  . Anxiety   . Aortic valve stenosis 10/10/2016  . Arthritis    "fingers" (07/24/2017)  . CHF (congestive heart failure) ([place])   . COPD (chronic obstructive pulmonary disease) ([place])   . Depression   . Gallstones   . Heart murmur     . High cholesterol   . History of kidney stones   . Hypertension   . Hypothyroidism   . Migraines    "none in years til 01/2013-02/2013; none since"  (07/24/2017)  . Orthopnoea    "just since open heart surgery" (02/25/2013)  . Pacemaker    [employer]   . Pneumonia ~ 2012   "walking pneumonia"  . Pneumonia 07/23/2017  . Stroke [facility]) 2001   denies residual on 02/25/2013  . Symptomatic hypotension 10/10/2016  . Syncope and collapse    "prior to open heart surgery" (02/25/2013)  . Type II diabetes mellitus ([place])     Past Surgical History:  Procedure Laterality Date  . BREAST SURGERY Right    "clamped blood vessel and stitched it up; in [place]"  . CARDIAC CATHETERIZATION    . CARDIAC VALVE REPLACEMENT  01/26/2013   "cow valve put in; widened the second valve" (02/25/2013) - [place], [place]  . CATARACT EXTRACTION W/ INTRAOCULAR LENS  IMPLANT, BILATERAL Bilateral 04/2017  . CHOLECYSTECTOMY OPEN  1971  . CYST EXCISION     "taken off my chest"  . INSERT / REPLACE / REMOVE PACEMAKER  01/19/2013   [employer] - dual chamber  . [place] RESECTION  2009   in [place]  . VEIN SURGERY Left    LLE; cut leg open and took out piece of vein that was protruding thru skin; stitched it up; done in [place]    FAMHx:  Family History  Problem Relation Age of Onset  . Lung cancer Mother   . Heart Problems Father   . Hypertension Brother   . Colon polyps Brother   . Skin cancer Brother   . Colon polyps Daughter   . Diabetes Maternal Aunt   . Breast cancer Maternal Aunt        mets to liver  . Diabetes Paternal Aunt     SOCHx:   reports that she quit smoking about 18 years ago. Her smoking use included cigarettes. She has a 80.00 pack-year smoking history. She has never used smokeless tobacco. She reports that she does not drink alcohol or use drugs.  ALLERGIES:  No Known Allergies  ROS: Pertinent items noted in HPI and remainder of comprehensive ROS  otherwise negative.  HOME MEDS: Current Outpatient Medications  Medication Sig Dispense Refill  . albuterol (PROVENTIL HFA;VENTOLIN HFA) 108 (90 BASE) MCG/ACT inhaler Inhale 2 puffs into the lungs. Use as directed -  With exercise    . alendronate (FOSAMAX) 70 MG tablet Take 70 mg by mouth every 7 (seven) days. SUNDAYS    . ALPRAZolam (XANAX) 0.25 MG tablet Take 1 tablet (0.25  mg total) by mouth daily as needed for anxiety. 1 tablet 0  . aspirin 325 MG tablet Take 325 mg by mouth daily.    Marland Kitchen atorvastatin (LIPITOR) 40 MG tablet Take 40 mg by mouth daily.    . carvedilol (COREG) 12.5 MG tablet Take 1 tablet (12.5 mg total) 2 (two) times daily by mouth. 180 tablet 3  . clonazePAM (KLONOPIN) 1 MG tablet Take 1 tablet (1 mg total) by mouth at bedtime. 1 tablet 0  . cloNIDine (CATAPRES) 0.2 MG tablet Take 1 tablet (0.2 mg total) by mouth at bedtime. 30 tablet 11  . Ferrous Sulfate (IRON) 325 (65 Fe) MG TABS Take 65 mg by mouth daily.  0  . FLUoxetine (PROZAC) 20 MG capsule Take 20 mg by mouth daily.    Marland Kitchen guaiFENesin (MUCINEX) 600 MG 12 hr tablet Take 600 mg by mouth daily.    . hydrocortisone 2.5 % cream Apply 1 application topically daily.    Marland Kitchen levothyroxine (SYNTHROID, LEVOTHROID) 50 MCG tablet Take 50 mcg by mouth daily before breakfast.    . metFORMIN (GLUCOPHAGE) 500 MG tablet Take 1,000 mg by mouth 2 (two) times daily.     Marland Kitchen olmesartan (BENICAR) 20 MG tablet Take 0.5 tablets (10 mg total) by mouth daily.    . Prenatal Vit-Fe Fumarate-FA (PREPLUS) 27-1 MG TABS Take 1 tablet by mouth daily.  3  . temazepam (RESTORIL) 30 MG capsule Take 30 mg by mouth at bedtime.     . Tiotropium Bromide-Olodaterol (STIOLTO RESPIMAT) 2.5-2.5 MCG/ACT AERS Inhale 2 puffs into the lungs daily.    . traMADol (ULTRAM) 50 MG tablet Take 50 mg by mouth as needed for moderate pain.     . Vitamin D, Ergocalciferol, (DRISDOL) 50000 units CAPS capsule Take 50,000 Units by mouth every 7 (seven) days. Tuesday of each week  0    No current facility-administered medications for this visit.     LABS/IMAGING: No results found for this or any previous visit (from the past 48 hour(s)). No results found.  VITALS: BP (!) 155/72   Pulse 72   Ht 5\' 1"  (1.549 m)   Wt 167 lb 9.6 oz (76 kg)   SpO2 93%   BMI 31.67 kg/m   EXAM: General appearance: alert and no distress Neck: no carotid bruit and no JVD Lungs: clear to auscultation bilaterally Heart: regular rate and rhythm, S1, S2 normal, systolic murmur: Midsystolic 3/6, crescendo at 2nd right intercostal space and diastolic murmur: early diastolic 2/6, blowing at lower left sternal border Abdomen: soft, non-tender; bowel sounds normal; no masses,  no organomegaly Extremities: extremities normal, atraumatic, no cyanosis or edema Pulses: 2+ and symmetric Skin: Skin color, texture, turgor normal. No rashes or lesions Neurologic: Grossly normal Psych: Normal  EKG: Deferred  ASSESSMENT: 1. Acute diastolic congestive heart failure - stable AV gradient, LVEF 55-60% (08/2017) 2. S/p [employer] pacemaker - complete heart block 3. ? Paroxysmal atrial flutter - no high rate episodes noted on her pacemaker  4. 01/26/13 - Aortic valve replacement with 23 mm Perimount Magna  Ease bovine pericardial bioprosthesis by [facility] and ascending aortic  replacement with 28 mm Terumo interposition graft ([facility]) 5. HTN - labile 6. Hyperlipidemia 7. DM2 8. 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 Labile hypertension 10. CAD - 40% mid-LAD stenosis  PLAN: 1.   Mrs. Dungee had recent exacerbation of diastolic congestive heart failure, I think related to salt loading and perhaps uncontrolled hypertension.  She improved with  diuretics and her daily diuretic dose has been adjusted.  Her weight is been stable since then.  Blood pressure is improved during the day with increases in all losartan.  Her a.m. blood pressures however remain elevated.  She is on  clonidine 0.1 mg nightly.  I recommend increasing that to 0.2 mg nightly.  She can follow-up with her primary care physician which she sees regularly and I am happy to see her back annually or sooner as necessary.  She will have a follow-up prior to that with Dr. Loletha Grayer in the pacemaker clinic.  Pixie Casino, MD, [facility], [place] Director of the Advanced Lipid Disorders &  Cardiovascular Risk Reduction Clinic Diplomate of the American Board of Clinical Lipidology Attending Cardiologist  Direct Dial: [phone]  Fax: [phone]  Website:  [email or web address] 09/18/2017, 9:08 AM

## 2017-09-19 ENCOUNTER — Encounter: Payer: Self-pay | Admitting: Cardiology

## 2017-10-04 LAB — CUP PACEART REMOTE DEVICE CHECK
Battery Remaining Percentage: 88 %
Brady Statistic RV Percent Paced: 19 %
Lead Channel Impedance Value: 651 Ohm
Lead Channel Impedance Value: 793 Ohm
Lead Channel Pacing Threshold Amplitude: 0.7 V
Lead Channel Pacing Threshold Pulse Width: 0.4 ms
Lead Channel Setting Pacing Amplitude: 2.5 V
Lead Channel Setting Pacing Pulse Width: 0.4 ms
MDC IDC MSMT BATTERY REMAINING LONGEVITY: 54 mo
MDC IDC PG IMPLANT DT: 20140908
MDC IDC PG SERIAL: 160873
MDC IDC SESS DTM: 20190507084100
MDC IDC SET LEADCHNL RA PACING AMPLITUDE: 2 V
MDC IDC SET LEADCHNL RV SENSING SENSITIVITY: 2.5 mV
MDC IDC STAT BRADY RA PERCENT PACED: 93 %

## 2017-12-31 ENCOUNTER — Ambulatory Visit (INDEPENDENT_AMBULATORY_CARE_PROVIDER_SITE_OTHER): Payer: Medicare Other | Admitting: *Deleted

## 2017-12-31 DIAGNOSIS — I495 Sick sinus syndrome: Secondary | ICD-10-CM

## 2018-01-01 NOTE — Progress Notes (Signed)
Remote pacemaker transmission.   

## 2018-02-03 LAB — CUP PACEART REMOTE DEVICE CHECK
Date Time Interrogation Session: 20190923154451
MDC IDC PG IMPLANT DT: 20140908
Pulse Gen Serial Number: 160873

## 2018-03-18 ENCOUNTER — Encounter: Payer: Self-pay | Admitting: Cardiovascular Disease

## 2018-03-18 ENCOUNTER — Ambulatory Visit (INDEPENDENT_AMBULATORY_CARE_PROVIDER_SITE_OTHER): Payer: Medicare Other | Admitting: Cardiovascular Disease

## 2018-03-18 VITALS — BP 107/60 | HR 94 | Ht 61.0 in | Wt 164.8 lb

## 2018-03-18 DIAGNOSIS — I351 Nonrheumatic aortic (valve) insufficiency: Secondary | ICD-10-CM

## 2018-03-18 DIAGNOSIS — I712 Thoracic aortic aneurysm, without rupture, unspecified: Secondary | ICD-10-CM

## 2018-03-18 DIAGNOSIS — I10 Essential (primary) hypertension: Secondary | ICD-10-CM

## 2018-03-18 DIAGNOSIS — I495 Sick sinus syndrome: Secondary | ICD-10-CM | POA: Diagnosis not present

## 2018-03-18 DIAGNOSIS — Z953 Presence of xenogenic heart valve: Secondary | ICD-10-CM | POA: Diagnosis not present

## 2018-03-18 DIAGNOSIS — R918 Other nonspecific abnormal finding of lung field: Secondary | ICD-10-CM

## 2018-03-18 DIAGNOSIS — Z950 Presence of cardiac pacemaker: Secondary | ICD-10-CM

## 2018-03-18 LAB — BASIC METABOLIC PANEL
BUN/Creatinine Ratio: 19 (ref 12–28)
BUN: 58 mg/dL — AB (ref 8–27)
CALCIUM: 9.4 mg/dL (ref 8.7–10.3)
CO2: 24 mmol/L (ref 20–29)
CREATININE: 3.05 mg/dL — AB (ref 0.57–1.00)
Chloride: 99 mmol/L (ref 96–106)
GFR, EST AFRICAN AMERICAN: 17 mL/min/{1.73_m2} — AB (ref >59)
GFR, EST NON AFRICAN AMERICAN: 15 mL/min/{1.73_m2} — AB (ref >59)
Glucose: 123 mg/dL — ABNORMAL HIGH (ref 65–99)
POTASSIUM: 4.9 mmol/L (ref 3.5–5.2)
Sodium: 141 mmol/L (ref 134–144)

## 2018-03-18 MED ORDER — ASPIRIN 81 MG PO TABS: 81.0000 mg | ORAL_TABLET | Freq: Every day | ORAL | Status: DC

## 2018-03-18 NOTE — Patient Instructions (Addendum)
Medication Instructions:  Dr Sallyanne Kuster recommends that you continue on your current medications as directed. Please refer to the Current Medication list given to you today.  If you need a refill on your cardiac medications before your next appointment, please call your pharmacy.   Lab work: Your physician recommends that you return for lab work 1 week before your CT.  If you have labs (blood work) drawn today and your tests are completely normal, you will receive your results only by: Marland Kitchen MyChart Message (if you have MyChart) OR . A paper copy in the mail If you have any lab test that is abnormal or we need to change your treatment, we will call you to review the results.  Testing/Procedures: 1. Remote monitoring is used to monitor your Pacemaker of ICD from home. This monitoring reduces the number of office visits required to check your device to one time per year. It allows [place] to keep an eye on the functioning of your device to ensure it is working properly. You are scheduled for a device check from home on Tuesday, November 19th, 2019. You may send your transmission at any time that day. If you have a wireless device, the transmission will be sent automatically. After your physician reviews your transmission, you will receive a postcard with your next transmission date.  2. CT Chest Aorta - Non-Cardiac CT scanning, (CAT scanning), is a noninvasive, special x-ray that produces cross-sectional images of the body using x-rays and a computer. CT scans help physicians diagnose and treat medical conditions. For some CT exams, a contrast material is used to enhance visibility in the area of the body being studied. CT scans provide greater clarity and reveal more details than regular x-ray exams.  >> This will be performed at our [facility] location [place], [place] [address] [phone]  Follow-Up: At [facility], you and your health needs are our priority.  As part of our  continuing mission to provide you with exceptional heart care, we have created designated Provider Care Teams.  These Care Teams include your primary Cardiologist (physician) and Advanced Practice Providers (APPs -  Physician Assistants and Nurse Practitioners) who all work together to provide you with the care you need, when you need it. You will need a follow up appointment in 12 months.  Please call our office 2 months in advance to schedule this appointment.  You may see Sanda Klein, MD or one of the following Advanced Practice Providers on your designated Care Team: [place], [place] . Fabian Sharp, PA-C

## 2018-03-18 NOTE — Progress Notes (Addendum)
Cardiology Office Note    Date:  03/19/2018   ID:  Tonya Glass, DOB 1946/01/03, MRN [id number]  PCP:  Hayden Rasmussen, MD  Cardiologist:  Kathy Breach, M.D.; Sanda Klein, MD   Chief complaint: Pacemaker follow-up    History of Present Illness:  Tonya Glass is a 72 y.o. 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  She has minor CAD by previous angiography (40% mid LAD pre-AVR 2014).  She bears a diagnosis of paroxysmal atrial flutter but none has been seen on her pacemaker recordings.  Her last visit was with Dr. Debara Pickett in May.  At that point she was recovering from her hospitalizations and her blood pressure was still high.  Her dose of clonidine was increased.  She was hospitalized in March 2019 with a pneumonia of the right lung in 3 weeks later was hospitalized for congestive heart failure treated with increased doses of furosemide.  Since then she has been doing well and denies any illness.  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  An echocardiogram performed during her April hospitalization showed normal left ventricular ejection fraction 55-60% stable gradients across the aortic valve biological prosthesis with mild-moderate aortic insufficiency and grade 2 diastolic dysfunction with elevated left atrial pressure.  The estimated systolic PA pressure was 36 mmHg.  She reports that for many years her blood pressure was impossible to control and that only recently at her last two office visits (with Dr.  Darron Doom a couple of weeks ago and with [place] today) has her blood pressure finally been controlled.  She is on carvedilol 12.5 mg twice daily, clonidine 0.2 mg at bedtime only, olmesartan once daily (our chart says 20 mg half tablet daily but she is taking a whole tablet, dosage uncertain).  She is not taking diuretics.  Her diabetes is controlled with metformin monotherapy.  She currently does not smoke with does have a 40-pack-year history of smoking.  Pacemaker interrogation shows normal device function.  Her [employer] ingenio dual-chamber pacemaker was implanted in 2014 and has roughly 4 years of estimated generator longevity.  Generator and lead parameters are excellent.  She has 94% atrial pacing and 23% ventricular pacing.  The underlying rhythm is sinus bradycardia.  She is not pacemaker dependent.  The presenting rhythm today was atrial paced ventricular paced.  Heart rate histogram appears slightly blunted but probably appropriate for her level of activity.  The device has recorded very rare episodes of brief nonsustained ventricular tachycardia most recently on January 16 and May 31, 2017.  Past Medical History:  Diagnosis Date  . Anxiety   . Aortic valve stenosis 10/10/2016  . Arthritis    "fingers" (07/24/2017)  . CHF (congestive heart failure) ([place])   . COPD (chronic obstructive pulmonary disease) ([place])   . Depression   . Gallstones   . Heart murmur   . High cholesterol   . History of kidney stones   . Hypertension   . Hypothyroidism   . Migraines    "none in years til 01/2013-02/2013; none since"  (07/24/2017)  . Orthopnoea    "  just since open heart surgery" (02/25/2013)  . Pacemaker    [employer]   . Pneumonia ~ 2012   "walking pneumonia"  . Pneumonia 07/23/2017  . Stroke [facility]) 2001   denies residual on 02/25/2013  . Symptomatic hypotension 10/10/2016  . Syncope and collapse    "prior to open heart surgery" (02/25/2013)  . Type II diabetes mellitus ([place])      Past Surgical History:  Procedure Laterality Date  . BREAST SURGERY Right    "clamped blood vessel and stitched it up; in [place]"  . CARDIAC CATHETERIZATION    . CARDIAC VALVE REPLACEMENT  01/26/2013   "cow valve put in; widened the second valve" (02/25/2013) - [place], [place]  . CATARACT EXTRACTION W/ INTRAOCULAR LENS  IMPLANT, BILATERAL Bilateral 04/2017  . CHOLECYSTECTOMY OPEN  1971  . CYST EXCISION     "taken off my chest"  . INSERT / REPLACE / REMOVE PACEMAKER  01/19/2013   [employer] - dual chamber  . [place] RESECTION  2009   in [place]  . VEIN SURGERY Left    LLE; cut leg open and took out piece of vein that was protruding thru skin; stitched it up; done in [place]    Current Medications: Outpatient Medications Prior to Visit  Medication Sig Dispense Refill  . albuterol (PROVENTIL HFA;VENTOLIN HFA) 108 (90 BASE) MCG/ACT inhaler Inhale 2 puffs into the lungs. Use as directed -  With exercise    . alendronate (FOSAMAX) 70 MG tablet Take 70 mg by mouth every 7 (seven) days. SUNDAYS    . ALPRAZolam (XANAX) 0.25 MG tablet Take 1 tablet (0.25 mg total) by mouth daily as needed for anxiety. 1 tablet 0  . atorvastatin (LIPITOR) 40 MG tablet Take 40 mg by mouth daily.    . carvedilol (COREG) 12.5 MG tablet Take 1 tablet (12.5 mg total) 2 (two) times daily by mouth. 180 tablet 3  . clonazePAM (KLONOPIN) 1 MG tablet Take 1 tablet (1 mg total) by mouth at bedtime. 1 tablet 0  . cloNIDine (CATAPRES) 0.2 MG tablet Take 1 tablet (0.2 mg total) by mouth at bedtime. 30 tablet 11  . Ferrous Sulfate (IRON) 325 (65 Fe) MG TABS Take 65 mg by mouth daily.  0  . FLUoxetine (PROZAC) 40 MG capsule Take 40 mg by mouth daily.     Marland Kitchen guaiFENesin (MUCINEX) 600 MG 12 hr tablet Take 600 mg by mouth daily.    . hydrocortisone 2.5 % cream Apply 1 application topically daily.    Marland Kitchen levothyroxine (SYNTHROID, LEVOTHROID) 50 MCG tablet Take 50 mcg by mouth daily before breakfast.    .  metFORMIN (GLUCOPHAGE) 500 MG tablet Take 1,000 mg by mouth 2 (two) times daily.     Marland Kitchen olmesartan (BENICAR) 20 MG tablet Take 0.5 tablets (10 mg total) by mouth daily.    . Prenatal Vit-Fe Fumarate-FA (PREPLUS) 27-1 MG TABS Take 1 tablet by mouth daily.  3  . temazepam (RESTORIL) 30 MG capsule Take 30 mg by mouth at bedtime.     . Tiotropium Bromide-Olodaterol (STIOLTO RESPIMAT) 2.5-2.5 MCG/ACT AERS Inhale 2 puffs into the lungs daily.    . traMADol (ULTRAM) 50 MG tablet Take 50 mg by mouth as needed for moderate pain.     . Vitamin D, Ergocalciferol, (DRISDOL) 50000 units CAPS capsule Take 50,000 Units by mouth every 7 (seven) days. Tuesday of each week  0  . aspirin 325 MG tablet Take 325 mg by mouth daily.  No facility-administered medications prior to visit.      Allergies:   Patient has no known allergies.   Social History   Socioeconomic History  . Marital status: Widowed    Spouse name: Not on file  . Number of children: 2  . Years of education: dental sch  . Highest education level: Not on file  Occupational History  . Occupation: retired  Scientific laboratory technician  . Financial resource strain: Not on file  . Food insecurity:    Worry: Not on file    Inability: Not on file  . Transportation needs:    Medical: Not on file    Non-medical: Not on file  Tobacco Use  . Smoking status: Former Smoker    Packs/day: 2.00    Years: 40.00    Pack years: 80.00    Types: Cigarettes    Last attempt to quit: 05/29/1999    Years since quitting: 18.8  . Smokeless tobacco: Never Used  Substance and Sexual Activity  . Alcohol use: No  . Drug use: No  . Sexual activity: Yes  Lifestyle  . Physical activity:    Days per week: Not on file    Minutes per session: Not on file  . Stress: Not on file  Relationships  . Social connections:    Talks on phone: Not on file    Gets together: Not on file    Attends religious service: Not on file    Active member of club or organization: Not on file     Attends meetings of clubs or organizations: Not on file    Relationship status: Not on file  Other Topics Concern  . Not on file  Social History Narrative  . Not on file     Family History:  The patient's family history includes Breast cancer in her maternal aunt; Colon polyps in her brother and daughter; Diabetes in her maternal aunt and paternal aunt; Heart Problems in her father; Hypertension in her brother; Lung cancer in her mother; Skin cancer in her brother.   ROS:   Please see the history of present illness.    ROS All other systems reviewed and are negative.   PHYSICAL EXAM:   VS:  BP 107/60   Pulse 94   Ht 5\' 1"  (1.549 m)   Wt 164 lb 12.8 oz (74.8 kg)   BMI 31.14 kg/m     General: Alert, oriented x3, no distress, mildly obese.  She looks quite comfortable Head: no evidence of trauma, PERRL, EOMI, no exophtalmos or lid lag, no myxedema, no xanthelasma; normal ears, nose and oropharynx Neck: normal jugular venous pulsations and no hepatojugular reflux; brisk carotid pulses without delay and no carotid bruits Chest: The left lung is clear to auscultation but there are dry crackles in the mid right lung that did not clear with cough, otherwise no signs of consolidation by percussion or palpation, normal fremitus, symmetrical and full respiratory excursions Cardiovascular: normal position and quality of the apical impulse, regular rhythm, normal first and second heart sounds, no rubs or gallops.  There is a 2/6 early peaking systolic ejection murmur in the aortic focus and a 2-3/6 very distinct decrescendo diastolic murmur heard all along the right sternal border and at the left lower sternal border as well, healthy left subclavian pacemaker site Abdomen: no tenderness or distention, no masses by palpation, no abnormal pulsatility or arterial bruits, normal bowel sounds, no hepatosplenomegaly Extremities: no clubbing, cyanosis or edema; 2+ radial, ulnar and brachial  pulses  bilaterally; 2+ right femoral, posterior tibial and dorsalis pedis pulses; 2+ left femoral, posterior tibial and dorsalis pedis pulses; no subclavian or femoral bruits Neurological: grossly nonfocal Psych: Normal mood and affect   Wt Readings from Last 3 Encounters:  03/18/18 164 lb 12.8 oz (74.8 kg)  09/18/17 167 lb 9.6 oz (76 kg)  08/20/17 167 lb 12.8 oz (76.1 kg)      Studies/Labs Reviewed:  ECHO 08/19/2017  - Left ventricle: The cavity size was normal. There was mild focal   basal hypertrophy of the septum. Systolic function was normal.   The estimated ejection fraction was in the range of 55% to 60%.   Wall motion was normal; there were no regional wall motion   abnormalities. Features are consistent with a pseudonormal left   ventricular filling pattern, with concomitant abnormal relaxation   and increased filling pressure (grade 2 diastolic dysfunction). - Ventricular septum: Septal motion showed paradox. These changes   are consistent with a post-thoracotomy state. - Aortic valve: A bioprosthesis was present. Transvalvular velocity   was increased more than expected, but unchanged from 2018. The   jet remains early peaking. There was mild to moderate   regurgitation. There was no significant perivalvular   regurgitation. Valve area (VTI): 1.24 cm^2. Valve area (Vmax):   1.16 cm^2. Valve area (Vmean): 1.12 cm^2. - Mitral valve: Calcified annulus. Mildly thickened leaflets .   There was mild regurgitation. - Left atrium: The atrium was mildly dilated. - Right ventricle: Systolic function was mildly reduced. - Right atrium: The atrium was mildly dilated. - Tricuspid valve: There was mild-moderate regurgitation directed   centrally. Diastolic regurgitation was present. - Pulmonary arteries: PA peak pressure: 36 mm Hg (S).  EKG:  EKG is ordered today.  It shows atrial paced, ventricular paced rhythm.  The paced QRS is very broad at 186 ms.  The QTC is subsequently prolonged  at 597 ms  ASSESSMENT:    1. Essential hypertension   2. SSS (sick sinus syndrome) ([place])   3. Pacemaker   4. S/P aortic valve replacement with bioprosthetic valve   5. Nonrheumatic aortic (valve) insufficiency   6. Thoracic aortic aneurysm without rupture ([place])   7. Lung field abnormal finding on examination      PLAN:  In order of problems listed above:  1. SSS: Her pacemaker was initially implanted for AV block, but this has recovered and she now requires pacing for persistent sinus bradycardia.  2. PPM: Normal pacemaker function.  She is not pacemaker dependent.  Heart rate histograms are slightly blunted, which is probably appropriate for her sedentary lifestyle.Remote downloads every 3 months and yearly office visit. 3. S/p AVR: Aortic valve gradients are stable.  On the last echo, the degree of aortic insufficiency was estimated to be a little more severe at mild-moderate, but she remains asymptomatic and has a normal size left ventricle with normal systolic function.  Consider repeating echocardiogram on a yearly basis. 4. HTN: She is very pleased that just recently her blood pressures finally well controlled. 5. Desc TAAA: Her chest CT showed enlargement of the descending thoracic aortic aneurysm compared with the previous evaluation and it is probably worthwhile repeating a chest CT with contrast to reevaluate this for stability.  I think a chest CT would also allow better assessment of the abnormal lung exam.  I am a little worried about the persistent crackles in her right lung, 6 months since her hospitalization for pneumonia and subsequently heart failure.  She does not appear volume overloaded clinically and the left lung is clear.  She has a 40-pack-year history of smoking.   Medication Adjustments/Labs and Tests Ordered: Current medicines are reviewed at length with the patient today.  Concerns regarding medicines are outlined above.  Medication changes, Labs and Tests  ordered today are listed in the Patient Instructions below. Patient Instructions  Medication Instructions:  Dr Sallyanne Kuster recommends that you continue on your current medications as directed. Please refer to the Current Medication list given to you today.  If you need a refill on your cardiac medications before your next appointment, please call your pharmacy.   Lab work: Your physician recommends that you return for lab work 1 week before your CT.  If you have labs (blood work) drawn today and your tests are completely normal, you will receive your results only by: Marland Kitchen MyChart Message (if you have MyChart) OR . A paper copy in the mail If you have any lab test that is abnormal or we need to change your treatment, we will call you to review the results.  Testing/Procedures: 1. Remote monitoring is used to monitor your Pacemaker of ICD from home. This monitoring reduces the number of office visits required to check your device to one time per year. It allows [place] to keep an eye on the functioning of your device to ensure it is working properly. You are scheduled for a device check from home on Tuesday, November 19th, 2019. You may send your transmission at any time that day. If you have a wireless device, the transmission will be sent automatically. After your physician reviews your transmission, you will receive a postcard with your next transmission date.  2. CT Chest Aorta - Non-Cardiac CT scanning, (CAT scanning), is a noninvasive, special x-ray that produces cross-sectional images of the body using x-rays and a computer. CT scans help physicians diagnose and treat medical conditions. For some CT exams, a contrast material is used to enhance visibility in the area of the body being studied. CT scans provide greater clarity and reveal more details than regular x-ray exams.  >> This will be performed at our [facility] location [place], [place]  [address] [phone]  Follow-Up: At [facility], you and your health needs are our priority.  As part of our continuing mission to provide you with exceptional heart care, we have created designated Provider Care Teams.  These Care Teams include your primary Cardiologist (physician) and Advanced Practice Providers (APPs -  Physician Assistants and Nurse Practitioners) who all work together to provide you with the care you need, when you need it. You will need a follow up appointment in 12 months.  Please call our office 2 months in advance to schedule this appointment.  You may see Sanda Klein, MD or one of the following Advanced Practice Providers on your designated Care Team: [place], [place] . Fabian Sharp, PA-C    Signed, Sanda Klein, MD  03/19/2018 5:53 PM    [address], [place], [place]  [address] Phone: [phone]; Fax: [phone]     ADDENDUM:  Labs performed in anticipation of CT angiogram of the thoracic aorta for descending thoracic aortic aneurysm showed an unexpected finding: Acute kidney injury with BUN 58 and creatinine 3.0 (previously normal values).  Repeat labs performed a day later were still abnormal although improving (creatinine 2.56).  The CT angiogram is canceled.  The patient was advised  to stop taking olmesartan and metformin until we clarify the cause for renal insufficiency and the course of her renal function.  She did not mention this initially, but after her repeat labs were drawn she recalls that the Saturday before (3 days before the initial blood draw) she had acute nausea/vomiting and diarrhea and some chills, possibly a problem with the food she ate.  Acute hypovolemia due to GI volume loss in the setting of chronic ARB therapy may have caused acute renal insufficiency.  She is currently asymptomatic.  We will plan to repeat the blood test again in a few days.  I offered referral to nephrology, but she feels  well and does not feel the need to see another specialist.  She is in the process of switching primary care providers.  Sanda Klein, MD, [facility] [phone] office [phone] pager 03/19/2018 5:57 PM

## 2018-03-19 ENCOUNTER — Other Ambulatory Visit: Payer: Self-pay | Admitting: *Deleted

## 2018-03-19 DIAGNOSIS — Z79899 Other long term (current) drug therapy: Secondary | ICD-10-CM

## 2018-03-19 DIAGNOSIS — I712 Thoracic aortic aneurysm, without rupture, unspecified: Secondary | ICD-10-CM | POA: Insufficient documentation

## 2018-03-19 DIAGNOSIS — I351 Nonrheumatic aortic (valve) insufficiency: Secondary | ICD-10-CM | POA: Insufficient documentation

## 2018-03-19 DIAGNOSIS — N289 Disorder of kidney and ureter, unspecified: Secondary | ICD-10-CM

## 2018-03-19 LAB — BASIC METABOLIC PANEL
BUN/Creatinine Ratio: 23 (ref 12–28)
BUN: 58 mg/dL — ABNORMAL HIGH (ref 8–27)
CALCIUM: 9 mg/dL (ref 8.7–10.3)
CO2: 24 mmol/L (ref 20–29)
CREATININE: 2.56 mg/dL — AB (ref 0.57–1.00)
Chloride: 100 mmol/L (ref 96–106)
GFR calc Af Amer: 21 mL/min/{1.73_m2} — ABNORMAL LOW (ref >59)
GFR calc non Af Amer: 18 mL/min/{1.73_m2} — ABNORMAL LOW (ref >59)
GLUCOSE: 142 mg/dL — AB (ref 65–99)
POTASSIUM: 4.8 mmol/L (ref 3.5–5.2)
SODIUM: 140 mmol/L (ref 134–144)

## 2018-03-21 ENCOUNTER — Telehealth: Payer: Self-pay

## 2018-03-21 DIAGNOSIS — N289 Disorder of kidney and ureter, unspecified: Secondary | ICD-10-CM

## 2018-03-21 NOTE — Telephone Encounter (Signed)
-----   Message from Sanda Klein, MD sent at 03/19/2018  5:38 PM EST ----- Creatinine is improving a little since yesterday, but clearly is abnormal.  She remains asymptomatic and describes normal urine output. Retrospectively, she recalls that on Saturday, November 2 she had nausea vomiting and diarrhea, which she thinks was related to a problem with the food she ate. It is possible that hypovolemia while receiving treatment with an angiotensin receptor blocker led to the acute kidney injury.  Creatinine seems to be heading in the right direction and electrolytes are normal. We will keep off the ARB and off metformin and repeat labs in a few days.  Please schedule for repeat metabolic panel on Monday, November 11 (she says if she cannot make it that day she will definitely come in on Tuesday, November 12.

## 2018-03-24 LAB — BASIC METABOLIC PANEL
BUN/Creatinine Ratio: 23 (ref 12–28)
BUN: 29 mg/dL — AB (ref 8–27)
CALCIUM: 9.1 mg/dL (ref 8.7–10.3)
CO2: 24 mmol/L (ref 20–29)
CREATININE: 1.28 mg/dL — AB (ref 0.57–1.00)
Chloride: 103 mmol/L (ref 96–106)
GFR calc Af Amer: 48 mL/min/{1.73_m2} — ABNORMAL LOW (ref >59)
GFR, EST NON AFRICAN AMERICAN: 42 mL/min/{1.73_m2} — AB (ref >59)
Glucose: 162 mg/dL — ABNORMAL HIGH (ref 65–99)
Potassium: 4.6 mmol/L (ref 3.5–5.2)
Sodium: 144 mmol/L (ref 134–144)

## 2018-03-25 ENCOUNTER — Telehealth: Payer: Self-pay | Admitting: Cardiovascular Disease

## 2018-03-25 NOTE — Telephone Encounter (Signed)
New message   Patient is calling for lab results.

## 2018-03-25 NOTE — Telephone Encounter (Signed)
Notes recorded by Sanda Klein, MD on 03/24/2018 at 5:50 PM EST Kidney function labs are essentially back to normal and I think she can go ahead with the angiogram as planned she will   Patient aware of results.    She is wondering if she needs to restart the metformin or the olmesartan.   She states she is concerned about being off the metformin.    Advised would send Dr. Sallyanne Kuster message to review and advise on medications.     Patient request to have this CT at [facility].  CC primary to make aware

## 2018-03-25 NOTE — Telephone Encounter (Signed)
She can restart the metformin today. She will need to stop it again for 72 hours AFTER the CT. She can also restart the olmesartan. She should NOT take the olmesartan on the day of the CT. Thanks [employer]

## 2018-03-26 ENCOUNTER — Other Ambulatory Visit: Payer: Self-pay | Admitting: Cardiovascular Disease

## 2018-03-26 DIAGNOSIS — I712 Thoracic aortic aneurysm, without rupture, unspecified: Secondary | ICD-10-CM

## 2018-03-27 ENCOUNTER — Telehealth: Payer: Self-pay | Admitting: Cardiovascular Disease

## 2018-03-27 DIAGNOSIS — I712 Thoracic aortic aneurysm, without rupture, unspecified: Secondary | ICD-10-CM

## 2018-03-27 NOTE — Telephone Encounter (Signed)
Routed to [facility] & Dr. Loletha Grayer

## 2018-03-27 NOTE — Telephone Encounter (Signed)
New order entered for CT chest w/o contrast. Previous ordered removed. [place] notified.

## 2018-03-27 NOTE — Telephone Encounter (Signed)
Miriam from [facility] called to let us know that patient refused to schedule appt for study, as she doesn't not want to have at CT scan done with contrast.

## 2018-03-27 NOTE — Telephone Encounter (Signed)
Spoke to patient, aware to restart medication per Dr. Sallyanne Kuster.   She is now having CT without contrast, therefore does not have to hold medications.   Patient verbalized understanding.

## 2018-03-27 NOTE — Telephone Encounter (Signed)
Discussed with Zigmund Daniel.  We will schedule for noncontrast CT.

## 2018-04-01 ENCOUNTER — Ambulatory Visit (INDEPENDENT_AMBULATORY_CARE_PROVIDER_SITE_OTHER): Payer: Medicare Other

## 2018-04-01 DIAGNOSIS — I495 Sick sinus syndrome: Secondary | ICD-10-CM | POA: Diagnosis not present

## 2018-04-02 ENCOUNTER — Ambulatory Visit
Admission: RE | Admit: 2018-04-02 | Discharge: 2018-04-02 | Disposition: A | Discharge status: Home / Self Care | Payer: Medicare Other | Source: Ambulatory Visit | Attending: Cardiovascular Disease | Admitting: Cardiovascular Disease

## 2018-04-02 DIAGNOSIS — I712 Thoracic aortic aneurysm, without rupture, unspecified: Secondary | ICD-10-CM

## 2018-04-03 ENCOUNTER — Other Ambulatory Visit: Payer: Medicare Other

## 2018-04-03 NOTE — Progress Notes (Signed)
Remote pacemaker transmission.   

## 2018-04-04 ENCOUNTER — Other Ambulatory Visit: Payer: Medicare Other

## 2018-04-04 ENCOUNTER — Telehealth: Payer: Self-pay | Admitting: Cardiovascular Disease

## 2018-04-04 NOTE — Telephone Encounter (Signed)
Follow Up:     Pt would like her CT Scan results from  04-02-18 please.

## 2018-04-07 NOTE — Telephone Encounter (Signed)
Called patient with results Friday, November 22nd 2019. Patient verbalized understanding.

## 2018-05-28 LAB — CUP PACEART REMOTE DEVICE CHECK
Battery Remaining Longevity: 48 mo
Battery Remaining Percentage: 72 %
Brady Statistic RA Percent Paced: 94 %
Brady Statistic RV Percent Paced: 23 %
Lead Channel Setting Pacing Amplitude: 2 V
Lead Channel Setting Pacing Pulse Width: 0.4 ms
Lead Channel Setting Sensing Sensitivity: 2.5 mV
MDC IDC MSMT LEADCHNL RA IMPEDANCE VALUE: 544 Ohm
MDC IDC MSMT LEADCHNL RA PACING THRESHOLD AMPLITUDE: 0.7 V
MDC IDC MSMT LEADCHNL RA PACING THRESHOLD PULSEWIDTH: 0.4 ms
MDC IDC MSMT LEADCHNL RV IMPEDANCE VALUE: 859 Ohm
MDC IDC PG IMPLANT DT: 20140908
MDC IDC PG SERIAL: 160873
MDC IDC SESS DTM: 20191119094100
MDC IDC SET LEADCHNL RV PACING AMPLITUDE: 2.5 V

## 2018-06-13 LAB — CUP PACEART INCLINIC DEVICE CHECK
Implantable Pulse Generator Implant Date: 20140908
MDC IDC SESS DTM: 20200131150743
Pulse Gen Serial Number: 160873

## 2018-07-01 ENCOUNTER — Ambulatory Visit (INDEPENDENT_AMBULATORY_CARE_PROVIDER_SITE_OTHER): Payer: Medicare Other

## 2018-07-01 DIAGNOSIS — I495 Sick sinus syndrome: Secondary | ICD-10-CM

## 2018-07-02 LAB — CUP PACEART REMOTE DEVICE CHECK
Battery Remaining Percentage: 68 %
Date Time Interrogation Session: 20200218094100
Implantable Pulse Generator Implant Date: 20140908
Lead Channel Impedance Value: 841 Ohm
Lead Channel Pacing Threshold Pulse Width: 0.4 ms
Lead Channel Setting Sensing Sensitivity: 2.5 mV
MDC IDC MSMT BATTERY REMAINING LONGEVITY: 42 mo
MDC IDC MSMT LEADCHNL RA IMPEDANCE VALUE: 515 Ohm
MDC IDC MSMT LEADCHNL RA PACING THRESHOLD AMPLITUDE: 0.7 V
MDC IDC SET LEADCHNL RA PACING AMPLITUDE: 2 V
MDC IDC SET LEADCHNL RV PACING AMPLITUDE: 2.5 V
MDC IDC SET LEADCHNL RV PACING PULSEWIDTH: 0.4 ms
MDC IDC STAT BRADY RA PERCENT PACED: 94 %
MDC IDC STAT BRADY RV PERCENT PACED: 26 %
Pulse Gen Serial Number: 160873

## 2018-07-09 NOTE — Progress Notes (Signed)
Remote pacemaker transmission.   

## 2018-07-14 ENCOUNTER — Encounter: Payer: Self-pay | Admitting: Cardiology

## 2018-07-14 NOTE — Progress Notes (Signed)
Letter  

## 2018-09-23 ENCOUNTER — Telehealth: Payer: Medicare Other | Admitting: Internal Medicine

## 2018-09-24 ENCOUNTER — Telehealth (INDEPENDENT_AMBULATORY_CARE_PROVIDER_SITE_OTHER): Payer: Medicare Other | Admitting: Internal Medicine

## 2018-09-24 ENCOUNTER — Encounter: Payer: Self-pay | Admitting: Internal Medicine

## 2018-09-24 VITALS — BP 140/62 | HR 74 | Ht 61.0 in | Wt 167.0 lb

## 2018-09-24 DIAGNOSIS — I5032 Chronic diastolic (congestive) heart failure: Secondary | ICD-10-CM | POA: Diagnosis not present

## 2018-09-24 DIAGNOSIS — Z953 Presence of xenogenic heart valve: Secondary | ICD-10-CM | POA: Diagnosis not present

## 2018-09-24 DIAGNOSIS — I10 Essential (primary) hypertension: Secondary | ICD-10-CM

## 2018-09-24 DIAGNOSIS — N289 Disorder of kidney and ureter, unspecified: Secondary | ICD-10-CM

## 2018-09-24 DIAGNOSIS — Z9889 Other specified postprocedural states: Secondary | ICD-10-CM

## 2018-09-24 DIAGNOSIS — Z952 Presence of prosthetic heart valve: Secondary | ICD-10-CM

## 2018-09-24 DIAGNOSIS — Z950 Presence of cardiac pacemaker: Secondary | ICD-10-CM

## 2018-09-24 DIAGNOSIS — I495 Sick sinus syndrome: Secondary | ICD-10-CM

## 2018-09-24 NOTE — Progress Notes (Signed)
Virtual Visit via Telephone Note   This visit type was conducted due to national recommendations for restrictions regarding the COVID-19 Pandemic (e.g. social distancing) in an effort to limit this patient's exposure and mitigate transmission in our community.  Due to her co-morbid illnesses, this patient is at least at moderate risk for complications without adequate follow up.  This format is felt to be most appropriate for this patient at this time.  The patient did not have access to video technology/had technical difficulties with video requiring transitioning to audio format only (telephone).  All issues noted in this document were discussed and addressed.  No physical exam could be performed with this format.  Please refer to the patient's chart for her  consent to telehealth for [facility].   Evaluation Performed: Telephone follow-up  Date:  09/24/2018   ID:  Tonya Glass, DOB [birth year], MRN [id number]  Patient Location:  [address] [place] [address]  Provider location:   [address], [place] [address]  PCP:  Sonia Side., FNP  Cardiologist:  Pixie Casino, MD Electrophysiologist:  None   Chief Complaint: No complaints  History of Present Illness:    Tonya Glass is a 73 y.o. female who presents via audio/video conferencing for a telehealth visit today.  Tonya Glass is a pleasant 73 year old female that I am following with a history of aortic valve replacement, sick sinus syndrome status post pacemaker, diastolic congestive heart failure, COPD, difficult to control hypertension, dyslipidemia and type 2 diabetes.  She frequently visits friends in [place] and was recently in [place] just prior to the outbreak of coronavirus.  Fortunately she was not affected and her family gave her masks to bring back with her.  Since then she is essentially isolated at home.  She denies any worsening shortness of breath or chest  pain.  She had a remote pacer check in February which was functioning normally and has an upcoming remote pacer check soon.  She reports her PCP just did some blood work on her which was faxed to our office however I do not have that immediately available to review.  The patient does not have symptoms concerning for COVID-19 infection (fever, chills, cough, or new SHORTNESS OF BREATH).    Prior CV studies:   The following studies were reviewed today:  Chart review  PMHx:  Past Medical History:  Diagnosis Date   Anxiety    Aortic valve stenosis 10/10/2016   Arthritis    "fingers" (07/24/2017)   CHF (congestive heart failure) (HCC)    COPD (chronic obstructive pulmonary disease) (HCC)    Depression    Gallstones    Heart murmur    High cholesterol    History of kidney stones    Hypertension    Hypothyroidism    Migraines    "none in years til 01/2013-02/2013; none since"  (07/24/2017)   Orthopnoea    "just since open heart surgery" (02/25/2013)   Pacemaker    [employer]    Pneumonia ~ 2012   "walking pneumonia"   Pneumonia 07/23/2017   Stroke ([place]) 2001   denies residual on 02/25/2013   Symptomatic hypotension 10/10/2016   Syncope and collapse    "prior to open heart surgery" (02/25/2013)   Type II diabetes mellitus ([place])     Past Surgical History:  Procedure Laterality Date   BREAST SURGERY Right    "clamped blood vessel and stitched it up; in [place]"  CARDIAC CATHETERIZATION     CARDIAC VALVE REPLACEMENT  01/26/2013   "cow valve put in; widened the second valve" (02/25/2013) - [place], [place]   CATARACT EXTRACTION W/ INTRAOCULAR LENS  IMPLANT, BILATERAL Bilateral 04/2017   CHOLECYSTECTOMY OPEN  1971   CYST EXCISION     "taken off my chest"   INSERT / REPLACE / REMOVE PACEMAKER  01/19/2013   [place] - dual chamber   LAPAROSCOPIC GASTRIC SLEEVE RESECTION  2009   in [place]   VEIN SURGERY Left    LLE; cut leg open and took  out piece of vein that was protruding thru skin; stitched it up; done in [place]    FAMHx:  Family History  Problem Relation Age of Onset   Lung cancer Mother    Heart Problems Father    Hypertension Brother    Colon polyps Brother    Skin cancer Brother    Colon polyps Daughter    Diabetes Maternal Aunt    Breast cancer Maternal Aunt        mets to liver   Diabetes Paternal Aunt     SOCHx:   reports that she quit smoking about 19 years ago. Her smoking use included cigarettes. She has a 80.00 pack-year smoking history. She has never used smokeless tobacco. She reports that she does not drink alcohol or use drugs.  ALLERGIES:  No Known Allergies  MEDS:  Current Meds  Medication Sig   albuterol (PROVENTIL HFA;VENTOLIN HFA) 108 (90 BASE) MCG/ACT inhaler Inhale 2 puffs into the lungs. Use as directed -  With exercise   alendronate (FOSAMAX) 70 MG tablet Take 70 mg by mouth every 7 (seven) days. SUNDAYS   aspirin 81 MG tablet Take 1 tablet (81 mg total) by mouth daily.   atorvastatin (LIPITOR) 40 MG tablet Take 40 mg by mouth daily.   carvedilol (COREG) 12.5 MG tablet Take 1 tablet (12.5 mg total) 2 (two) times daily by mouth.   cloNIDine (CATAPRES) 0.2 MG tablet Take 1 tablet (0.2 mg total) by mouth at bedtime.   Ferrous Sulfate (IRON) 325 (65 Fe) MG TABS Take 65 mg by mouth daily.   FLUoxetine (PROZAC) 40 MG capsule Take 40 mg by mouth daily.    hydrocortisone 2.5 % cream Apply 1 application topically daily.   levothyroxine (SYNTHROID, LEVOTHROID) 50 MCG tablet Take 50 mcg by mouth daily before breakfast.   metFORMIN (GLUCOPHAGE) 500 MG tablet Take 1,000 mg by mouth 2 (two) times daily.    olmesartan (BENICAR) 20 MG tablet Take 0.5 tablets (10 mg total) by mouth daily.   Prenatal Vit-Fe Fumarate-FA (PREPLUS) 27-1 MG TABS Take 1 tablet by mouth daily.   temazepam (RESTORIL) 30 MG capsule Take 30 mg by mouth at bedtime.    traMADol (ULTRAM) 50 MG tablet  Take 50 mg by mouth as needed for moderate pain.    [DISCONTINUED] ALPRAZolam (XANAX) 0.25 MG tablet Take 1 tablet (0.25 mg total) by mouth daily as needed for anxiety.   [DISCONTINUED] Vitamin D, Ergocalciferol, (DRISDOL) 50000 units CAPS capsule Take 50,000 Units by mouth every 7 (seven) days. Tuesday of each week     ROS: Pertinent items noted in HPI and remainder of comprehensive ROS otherwise negative.  Labs/Other Tests and Data Reviewed:    Recent Labs: 03/24/2018: BUN 29; Creatinine, Ser 1.28; Potassium 4.6; Sodium 144   Recent Lipid Panel No results found for: CHOL, TRIG, HDL, CHOLHDL, LDLCALC, LDLDIRECT  Wt Readings from Last 3 Encounters:  09/24/18  167 lb (75.8 kg)  03/18/18 164 lb 12.8 oz (74.8 kg)  09/18/17 167 lb 9.6 oz (76 kg)     Exam:    Vital Signs:  BP 140/62    Pulse 74    Ht 5\' 1"  (1.549 m)    Wt 167 lb (75.8 kg)    BMI 31.55 kg/m    Exam not performed due to telephone visit  ASSESSMENT & PLAN:    1. Chronic diastolic congestive heart failure - stable AV gradient, LVEF 55-60% (08/2017) 2. S/p [employer] pacemaker - complete heart block 3. ? Paroxysmal atrial flutter - no high rate episodes noted on her pacemaker  4. 01/26/13 - Aortic valve replacement with 23 mm Perimount Magna  Ease bovine pericardial bioprosthesis by [place] and ascending aortic  replacement with 28 mm Terumo interposition graft ([facility]) 5. HTN - labile 6. Hyperlipidemia 7. DM2 8. 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 Labile hypertension 10. CAD - 40% mid-LAD stenosis  Overall Ms. Cortner is actually doing quite well.  Her blood pressure is top normal today but has been generally around 093-267 systolic.  Her most recent pacemaker checks have been normal.  She had evaluation of her aortic valve bioprosthesis recently by echo in 2019.  Her aortic gradient is stable.  I will review lab work which was faxed to our office from her PCP.  No changes were made to  her medicines today.  Follow-up in 6 months or sooner as necessary.  COVID-19 Education: The signs and symptoms of COVID-19 were discussed with the patient and how to seek care for testing (follow up with PCP or arrange E-visit).  The importance of social distancing was discussed today.  Patient Risk:   After full review of this patients clinical status, I feel that they are at least moderate risk at this time.  Time:   Today, I have spent 25 minutes with the patient with telehealth technology discussing bioprosthetic aortic valve, pacemaker, hypertension, hyperlipidemia, chronic diastolic heart failure.     Medication Adjustments/Labs and Tests Ordered: Current medicines are reviewed at length with the patient today.  Concerns regarding medicines are outlined above.   Tests Ordered: No orders of the defined types were placed in this encounter.   Medication Changes: No orders of the defined types were placed in this encounter.   Disposition:  in 6 month(s)  Pixie Casino, MD, [facility], [place] Director of the Advanced Lipid Disorders &  Cardiovascular Risk Reduction Clinic Diplomate of the American Board of Clinical Lipidology Attending Cardiologist  Direct Dial: [phone]   Fax: [phone]  Website:  [email or web address]  Pixie Casino, MD  09/24/2018 4:21 PM

## 2018-09-24 NOTE — Patient Instructions (Signed)
Medication Instructions:   Your physician recommends that you continue on your current medications as directed. Please refer to the Current Medication list given to you today.  If you need a refill on your cardiac medications before your next appointment, please call your pharmacy.   Lab work:  NONE ordered at this time of appointment   If you have labs (blood work) drawn today and your tests are completely normal, you will receive your results only by: . MyChart Message (if you have MyChart) OR . A paper copy in the mail If you have any lab test that is abnormal or we need to change your treatment, we will call you to review the results.  Testing/Procedures:  NONE ordered at this time of appointment   Follow-Up: At [facility], you and your health needs are our priority.  As part of our continuing mission to provide you with exceptional heart care, we have created designated Provider Care Teams.  These Care Teams include your primary Cardiologist (physician) and Advanced Practice Providers (APPs -  Physician Assistants and Nurse Practitioners) who all work together to provide you with the care you need, when you need it. You will need a follow up appointment in 6 months.  Please call our office 2 months in advance to schedule this appointment.  You may see Kenneth C Hilty, MD or one of the following Advanced Practice Providers on your designated Care Team: Hao Meng, PA-C . Angela Duke, PA-C  Any Other Special Instructions Will Be Listed Below (If Applicable).    

## 2018-09-30 ENCOUNTER — Other Ambulatory Visit: Payer: Self-pay

## 2018-09-30 ENCOUNTER — Ambulatory Visit (INDEPENDENT_AMBULATORY_CARE_PROVIDER_SITE_OTHER): Payer: Medicare Other | Admitting: *Deleted

## 2018-09-30 DIAGNOSIS — I495 Sick sinus syndrome: Secondary | ICD-10-CM | POA: Diagnosis not present

## 2018-10-01 LAB — CUP PACEART REMOTE DEVICE CHECK
Battery Remaining Longevity: 42 mo
Battery Remaining Percentage: 67 %
Brady Statistic RA Percent Paced: 94 %
Brady Statistic RV Percent Paced: 29 %
Date Time Interrogation Session: 20200519084100
Implantable Pulse Generator Implant Date: 20140908
Lead Channel Impedance Value: 519 Ohm
Lead Channel Impedance Value: 754 Ohm
Lead Channel Pacing Threshold Amplitude: 0.7 V
Lead Channel Pacing Threshold Pulse Width: 0.4 ms
Lead Channel Setting Pacing Amplitude: 2 V
Lead Channel Setting Pacing Amplitude: 2.5 V
Lead Channel Setting Pacing Pulse Width: 0.4 ms
Lead Channel Setting Sensing Sensitivity: 2.5 mV
Pulse Gen Serial Number: 160873

## 2018-10-09 ENCOUNTER — Encounter: Payer: Self-pay | Admitting: Cardiology

## 2018-10-09 NOTE — Progress Notes (Signed)
Remote pacemaker transmission.   

## 2018-12-15 ENCOUNTER — Ambulatory Visit
Admission: RE | Admit: 2018-12-15 | Discharge: 2018-12-15 | Disposition: A | Discharge status: Home / Self Care | Payer: Medicare Other | Source: Ambulatory Visit | Attending: Family | Admitting: Family

## 2018-12-15 ENCOUNTER — Other Ambulatory Visit: Payer: Self-pay | Admitting: Family

## 2018-12-15 DIAGNOSIS — W19XXXA Unspecified fall, initial encounter: Secondary | ICD-10-CM

## 2018-12-16 ENCOUNTER — Other Ambulatory Visit: Payer: Self-pay | Admitting: Family

## 2018-12-30 ENCOUNTER — Encounter: Payer: Medicare Other | Admitting: *Deleted

## 2018-12-31 ENCOUNTER — Telehealth: Payer: Self-pay

## 2018-12-31 NOTE — Telephone Encounter (Signed)
Left message for patient to remind of missed remote transmission.  

## 2019-01-12 ENCOUNTER — Ambulatory Visit (INDEPENDENT_AMBULATORY_CARE_PROVIDER_SITE_OTHER): Payer: Medicare Other | Admitting: *Deleted

## 2019-01-12 ENCOUNTER — Telehealth: Payer: Self-pay | Admitting: Cardiovascular Disease

## 2019-01-12 DIAGNOSIS — I495 Sick sinus syndrome: Secondary | ICD-10-CM

## 2019-01-12 LAB — CUP PACEART REMOTE DEVICE CHECK
Battery Remaining Longevity: 42 mo
Battery Remaining Percentage: 64 %
Brady Statistic RA Percent Paced: 94 %
Brady Statistic RV Percent Paced: 33 %
Date Time Interrogation Session: 20200831160119
Implantable Lead Implant Date: 20140908
Implantable Lead Implant Date: 20140908
Implantable Lead Location: 753859
Implantable Lead Location: 753860
Implantable Lead Model: 4135
Implantable Lead Model: 4136
Implantable Lead Serial Number: 29485718
Implantable Lead Serial Number: 29487623
Implantable Pulse Generator Implant Date: 20140908
Lead Channel Impedance Value: 557 Ohm
Lead Channel Impedance Value: 825 Ohm
Lead Channel Pacing Threshold Amplitude: 0.7 V
Lead Channel Pacing Threshold Pulse Width: 0.4 ms
Lead Channel Setting Pacing Amplitude: 2 V
Lead Channel Setting Pacing Amplitude: 2.5 V
Lead Channel Setting Pacing Pulse Width: 0.4 ms
Lead Channel Setting Sensing Sensitivity: 2.5 mV
Pulse Gen Serial Number: 160873

## 2019-01-12 NOTE — Telephone Encounter (Signed)
° ° ° °  1. Has your device fired? NO  2. Is you device beeping? NO 3. Are you experiencing draining or swelling at device site? NO  4. Are you calling to see if we received your device transmission? YES, sent today around 2  5. Have you passed out? NO    Please route to [place]

## 2019-01-12 NOTE — Telephone Encounter (Signed)
Informed pt that her remote transmission was received.

## 2019-01-26 ENCOUNTER — Telehealth: Payer: Self-pay

## 2019-01-26 NOTE — Telephone Encounter (Signed)
Pt states she sent the unscheduled transmission because someone from the company called and asked her to send a manual transmission. I apologized for calling her and explained that whomever called her did not document the call. I let her know I will document that someone called her to send the unscheduled transmission.

## 2019-01-28 ENCOUNTER — Encounter: Payer: Self-pay | Admitting: Cardiology

## 2019-01-28 NOTE — Progress Notes (Signed)
Remote pacemaker transmission.   

## 2019-02-12 ENCOUNTER — Telehealth: Payer: Self-pay | Admitting: Cardiovascular Disease

## 2019-02-12 MED ORDER — OLMESARTAN MEDOXOMIL 20 MG PO TABS: 10.0000 mg | ORAL_TABLET | Freq: Every day | ORAL | 3 refills | Status: DC

## 2019-02-12 NOTE — Telephone Encounter (Signed)
Called to discuss 1.5 hour episode of atrial fibrillation recorded by pacemaker last night. She was asymptomatic. She had a near-syncope (full syncope for a second?) episode about 3 weeks ago. No arrhythmic events at the time. She has had at least 2 falls in her kitchen, trips, one with head injury. Uses a walker now. Often dizzy, unsteady gait. She is taking olmesartan 40 mg daily (our records reported half a 20 mg tab = 10 mg daily). She often has very low diastolic BP. She has (nonrheumatic) aortic valve bioprosthesis, chronic diastolic HF, DM 2, HTN, but no history of CVA/TIA (CHADSVasc 5-6). =/- CAD. Meets criteria for full anticoagulation, but would like to clarify the cause of her falls and se if we can prevent them by reducing BP meds. She has an appointment with me and Dr. Debara Pickett in a few weeks. Will discuss anticoagulation then. Reduce olmesartan to 10 mg daily.  Sanda Klein, MD, [facility] [phone] office [phone] pager

## 2019-02-21 ENCOUNTER — Emergency Department (HOSPITAL_BASED_OUTPATIENT_CLINIC_OR_DEPARTMENT_OTHER)
Admission: EM | Admit: 2019-02-21 | Discharge: 2019-02-21 | Disposition: A | Discharge status: Home / Self Care | Payer: Medicare Other | Attending: Emergency Medicine | Admitting: Emergency Medicine

## 2019-02-21 ENCOUNTER — Other Ambulatory Visit: Payer: Self-pay

## 2019-02-21 ENCOUNTER — Encounter (HOSPITAL_BASED_OUTPATIENT_CLINIC_OR_DEPARTMENT_OTHER): Payer: Self-pay | Admitting: Emergency Medicine

## 2019-02-21 DIAGNOSIS — Z7984 Long term (current) use of oral hypoglycemic drugs: Secondary | ICD-10-CM | POA: Diagnosis not present

## 2019-02-21 DIAGNOSIS — Z7982 Long term (current) use of aspirin: Secondary | ICD-10-CM | POA: Insufficient documentation

## 2019-02-21 DIAGNOSIS — I5032 Chronic diastolic (congestive) heart failure: Secondary | ICD-10-CM | POA: Insufficient documentation

## 2019-02-21 DIAGNOSIS — L8990 Pressure ulcer of unspecified site, unspecified stage: Secondary | ICD-10-CM | POA: Diagnosis present

## 2019-02-21 DIAGNOSIS — Z87891 Personal history of nicotine dependence: Secondary | ICD-10-CM | POA: Insufficient documentation

## 2019-02-21 DIAGNOSIS — E119 Type 2 diabetes mellitus without complications: Secondary | ICD-10-CM | POA: Insufficient documentation

## 2019-02-21 DIAGNOSIS — L89151 Pressure ulcer of sacral region, stage 1: Secondary | ICD-10-CM | POA: Insufficient documentation

## 2019-02-21 DIAGNOSIS — E039 Hypothyroidism, unspecified: Secondary | ICD-10-CM | POA: Insufficient documentation

## 2019-02-21 DIAGNOSIS — Z79899 Other long term (current) drug therapy: Secondary | ICD-10-CM | POA: Insufficient documentation

## 2019-02-21 DIAGNOSIS — I110 Hypertensive heart disease with heart failure: Secondary | ICD-10-CM | POA: Diagnosis not present

## 2019-02-21 DIAGNOSIS — H029 Unspecified disorder of eyelid: Secondary | ICD-10-CM

## 2019-02-21 MED ORDER — CEPHALEXIN 500 MG PO CAPS: 500.0000 mg | ORAL_CAPSULE | Freq: Four times a day (QID) | ORAL | 0 refills | Status: DC

## 2019-02-21 NOTE — ED Provider Notes (Signed)
[facility] EMERGENCY DEPARTMENT Provider Note   CSN: [id number] Arrival date & time: 02/21/19  1828     History   Chief Complaint Chief Complaint  Patient presents with  . Wound Check    buttock  . Wound Check    right eye    HPI Tonya Glass is a 73 y.o. female with history of hypertension, hypothyroidism, CHF, COPD, diabetes who presents for wound check of a decubitus ulcer that has been present for 1 month.  She also has a lesion on her right eyelid that has been present for several months.  No eye lesion is not painful.  It is just annoying her.  The decubitus ulcer has been draining clear yellow fluid.  It is painful.  She sits to sleep so that her lungs will drain.  She has been using Eller cream and hydrocortisone, per her doctor.     HPI  Past Medical History:  Diagnosis Date  . Anxiety   . Aortic valve stenosis 10/10/2016  . Arthritis    "fingers" (07/24/2017)  . CHF (congestive heart failure) ([place])   . COPD (chronic obstructive pulmonary disease) ([place])   . Depression   . Gallstones   . Heart murmur   . High cholesterol   . History of kidney stones   . Hypertension   . Hypothyroidism   . Migraines    "none in years til 01/2013-02/2013; none since"  (07/24/2017)  . Orthopnoea    "just since open heart surgery" (02/25/2013)  . Pacemaker    [employer]   . Pneumonia ~ 2012   "walking pneumonia"  . Pneumonia 07/23/2017  . Stroke [facility]) 2001   denies residual on 02/25/2013  . Symptomatic hypotension 10/10/2016  . Syncope and collapse    "prior to open heart surgery" (02/25/2013)  . Type II diabetes mellitus [facility])     Patient Active Problem List   Diagnosis Date Noted  . Thoracic aortic aneurysm without rupture ([place]) 03/19/2018  . Nonrheumatic aortic (valve) insufficiency 03/19/2018  . Acute CHF (congestive heart failure) ([place]) 08/18/2017  . Acute diastolic CHF (congestive heart failure) ([place]) 08/18/2017  . CHF (congestive heart failure)  ([place]) 08/18/2017  . Hypothyroidism 07/23/2017  . Acute respiratory failure with hypoxia ([place]) 07/23/2017  . Pneumonia 07/23/2017  . Aortic valve stenosis 10/10/2016  . Diastolic murmur [id number]  . SSS (sick sinus syndrome) ([place]) 03/20/2016  . 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  . CAD (coronary artery disease) 09/06/2014  . Pacemaker 05/28/2013  . Essential hypertension 05/28/2013  . Dyslipidemia 05/28/2013  . S/P aortic valve replacement with bioprosthetic valve 05/28/2013  . DM2 (diabetes mellitus, type 2) ([place]) 05/28/2013  . Chronic heart failure ([place]) 05/28/2013    Past Surgical History:  Procedure Laterality Date  . BREAST SURGERY Right    "clamped blood vessel and stitched it up; in [place]"  . CARDIAC CATHETERIZATION    . CARDIAC VALVE REPLACEMENT  01/26/2013   "cow valve put in; widened the second valve" (02/25/2013) - [place], [place]  . CATARACT EXTRACTION W/ INTRAOCULAR LENS  IMPLANT, BILATERAL Bilateral 04/2017  . CHOLECYSTECTOMY OPEN  1971  . CYST EXCISION     "taken off my chest"  . INSERT / REPLACE / REMOVE PACEMAKER  01/19/2013   [employer] - dual chamber  . [place] RESECTION  2009   in [place]  . VEIN SURGERY Left    LLE; cut leg open and took out piece of vein that was protruding thru  skin; stitched it up; done in [place]     OB History   No obstetric history on file.      Home Medications    Prior to Admission medications   Medication Sig Start Date End Date Taking? Authorizing Provider  albuterol (PROVENTIL HFA;VENTOLIN HFA) 108 (90 BASE) MCG/ACT inhaler Inhale 2 puffs into the lungs. Use as directed -  With exercise 12/08/13   [provider]  alendronate (FOSAMAX) 70 MG tablet Take 70 mg by mouth every 7 (seven) days. SUNDAYS 07/13/17   [provider]  aspirin 81 MG tablet Take 1 tablet (81 mg total) by mouth daily. 03/18/18   Croitoru, Mihai, MD  atorvastatin (LIPITOR) 40 MG tablet Take 40 mg by mouth daily.     [provider]  carvedilol (COREG) 12.5 MG tablet Take 1 tablet (12.5 mg total) 2 (two) times daily by mouth. 03/22/17   Croitoru, Mihai, MD  cephALEXin (KEFLEX) 500 MG capsule Take 1 capsule (500 mg total) by mouth 4 (four) times daily. 02/21/19   Caisen Mangas, Bea Graff, PA-C  cloNIDine (CATAPRES) 0.2 MG tablet Take 1 tablet (0.2 mg total) by mouth at bedtime. 09/18/17   Hilty, Nadean Corwin, MD  Ferrous Sulfate (IRON) 325 (65 Fe) MG TABS Take 65 mg by mouth daily. 08/13/17   [provider]  FLUoxetine (PROZAC) 40 MG capsule Take 40 mg by mouth daily.     [provider]  furosemide (LASIX) 40 MG tablet Take 40 mg by mouth daily. 07/28/18   [provider]  gabapentin (NEURONTIN) 100 MG capsule TAKE 3 CAPSULES BY MOUTH EVERY DAY AT BEDTIME 06/29/18   [provider]  guaiFENesin (MUCINEX) 600 MG 12 hr tablet Take 600 mg by mouth daily.    [provider]  hydrocortisone 2.5 % cream Apply 1 application topically daily. 07/15/17   [provider]  levothyroxine (SYNTHROID, LEVOTHROID) 50 MCG tablet Take 50 mcg by mouth daily before breakfast.    [provider]  metFORMIN (GLUCOPHAGE) 500 MG tablet Take 1,000 mg by mouth 2 (two) times daily.  09/04/15   [provider]  olmesartan (BENICAR) 20 MG tablet Take 0.5 tablets (10 mg total) by mouth daily. 02/12/19   Croitoru, Mihai, MD  Prenatal Vit-Fe Fumarate-FA (PREPLUS) 27-1 MG TABS Take 1 tablet by mouth daily. 08/14/17   [provider]  SYMBICORT 160-4.5 MCG/ACT inhaler INHALE 2 PUFFS BY MOUTH TWICE DAILY IN THE MORNING AND IN THE EVENING 09/12/18   [provider]  temazepam (RESTORIL) 30 MG capsule Take 30 mg by mouth at bedtime.  07/07/17   [provider]  traMADol (ULTRAM) 50 MG tablet Take 50 mg by mouth as needed for moderate pain.  07/23/13   [provider]    Family History Family History  Problem Relation Age of Onset  . Lung cancer Mother    . Heart Problems Father   . Hypertension Brother   . Colon polyps Brother   . Skin cancer Brother   . Colon polyps Daughter   . Diabetes Maternal Aunt   . Breast cancer Maternal Aunt        mets to liver  . Diabetes Paternal Aunt     Social History Social History   Tobacco Use  . Smoking status: Former Smoker    Packs/day: 2.00    Years: 40.00    Pack years: 80.00    Types: Cigarettes    Quit date: 05/29/1999    Years since  quitting: 19.7  . Smokeless tobacco: Never Used  Substance Use Topics  . Alcohol use: No  . Drug use: No     Allergies   Patient has no known allergies.   Review of Systems Review of Systems  Constitutional: Negative for chills and fever.  HENT: Negative for facial swelling and sore throat.   Respiratory: Negative for shortness of breath.   Cardiovascular: Negative for chest pain.  Gastrointestinal: Negative for abdominal pain, nausea and vomiting.  Genitourinary: Negative for dysuria.  Musculoskeletal: Negative for back pain.  Skin: Positive for rash and wound.  Neurological: Negative for headaches.  Psychiatric/Behavioral: The patient is not nervous/anxious.      Physical Exam Updated Vital Signs BP (!) 119/49 (BP Location: Right Arm)   Pulse 70   Temp 98.9 F (37.2 C) (Oral)   Resp 20   Ht 5\' 1"  (1.549 m)   Wt 78.5 kg   SpO2 100%   BMI 32.69 kg/m   Physical Exam Vitals signs and nursing note reviewed.  Constitutional:      General: She is not in acute distress.    Appearance: She is well-developed. She is not diaphoretic.  HENT:     Head: Normocephalic and atraumatic.     Mouth/Throat:     Pharynx: No oropharyngeal exudate.  Eyes:     General: No scleral icterus.       Right eye: No discharge.        Left eye: No discharge.     Conjunctiva/sclera: Conjunctivae normal.     Pupils: Pupils are equal, round, and reactive to light.     Comments: Wart like lesion around 5 mm on the right eyelid, no erythema  Neck:      Musculoskeletal: Normal range of motion and neck supple.     Thyroid: No thyromegaly.  Cardiovascular:     Rate and Rhythm: Normal rate and regular rhythm.     Heart sounds: Normal heart sounds. No murmur. No friction rub. No gallop.   Pulmonary:     Effort: Pulmonary effort is normal. No respiratory distress.     Breath sounds: Normal breath sounds. No stridor. No wheezing or rales.  Abdominal:     General: Bowel sounds are normal. There is no distension.     Palpations: Abdomen is soft.     Tenderness: There is no abdominal tenderness. There is no guarding or rebound.  Lymphadenopathy:     Cervical: No cervical adenopathy.  Skin:    General: Skin is warm and dry.     Coloration: Skin is not pale.     Findings: No rash.     Comments: 1 cm, very superficial stage I sacral decubitus ulcer on the right buttock, no drainage, no surrounding erythema  Neurological:     Mental Status: She is alert.     Coordination: Coordination normal.      ED Treatments / Results  Labs (all labs ordered are listed, but only abnormal results are displayed) Labs Reviewed - No data to display  EKG None  Radiology No results found.  Procedures Procedures (including critical care time)  Medications Ordered in ED Medications - No data to display   Initial Impression / Assessment and Plan / ED Course  I have reviewed the triage vital signs and the nursing notes.  Pertinent labs & imaging results that were available during my care of the patient were reviewed by me and considered in my medical decision making (see chart for details).  Patient presenting with decubitus ulcer with reports of drainage and eye lesion.  Decubitus ulcer is well-appearing, however considering reported drainage, could be serious, will cover with short course of Keflex.  Regarding the eye lesion, could be a papilloma, but will refer to ophthalmology for excisional biopsy.  Patient and daughter understand agree  with plan.  Patient vitals stable throughout ED course and discharged in satisfactory condition.  Final Clinical Impressions(s) / ED Diagnoses   Final diagnoses:  Pressure injury of sacral region, stage 1  Eyelid lesion    ED Discharge Orders         Ordered    cephALEXin (KEFLEX) 500 MG capsule  4 times daily     02/21/19 2137           Frederica Kuster, PA-C 02/21/19 2241    Lennice Sites, DO 02/21/19 2346

## 2019-02-21 NOTE — ED Notes (Signed)
Family at bedside. 

## 2019-02-21 NOTE — ED Notes (Signed)
Pt verbalized understanding of wound care directions. D/c home with her daughter to drive

## 2019-02-21 NOTE — Discharge Instructions (Addendum)
Considering drainage and pain, will cover for infection with Keflex.  You can take your home pain medication as needed.  Wear specific bandages for pressure ulcers.  Try to limit pressure and friction as much as possible in that area.  Please follow-up with your doctor for recheck next week.  Please follow-up with the ophthalmology below for further evaluation of your eye lesion.  It is concerning and I do think you need a biopsy as soon as possible.  Please return to the emergency department if you develop any new or worsening symptoms.

## 2019-02-21 NOTE — ED Triage Notes (Signed)
Pt reports ulcer to right buttock area x 1 month. Pt reports area draining yellow colored secretions. Pt also c/o sore to right upper eyelid x 1 year.

## 2019-03-02 ENCOUNTER — Telehealth: Payer: Self-pay | Admitting: Cardiovascular Disease

## 2019-03-02 NOTE — Telephone Encounter (Signed)
Called patient, she states on October 1st, she was told to decrease her Olmesartan to 10 mg.  She was taking 40 mg, and now her blood pressures have been elevated.   Today was 191/120- patient is asymptomatic, denies chest pain, or SOB at this time.  She is questioning if she should increase to 20 mg tablet, since she currently cuts it in half to make 10 mg.   I advised I would route message to PharmD and MD to advise.  Patient verbalized understanding.

## 2019-03-02 NOTE — Telephone Encounter (Signed)
Understood 

## 2019-03-02 NOTE — Telephone Encounter (Signed)
Called patient back- advised of message from MD  Patient just took her BP and it was at 110/60- I advised patient to not take the increased dose right now, but to monitor the BP for the next few days and see if it is consistently high in the mornings. Patient agreed.  Will notify MD,

## 2019-03-02 NOTE — Telephone Encounter (Signed)
New message   Pt c/o BP issue: STAT if pt c/o blurred vision, one-sided weakness or slurred speech  1. What are your last 5 BP readings? 191/120  2. Are you having any other symptoms (ex. Dizziness, headache, blurred vision, passed out)? No    3. What is your BP issue?Patient states that her b/p is elevated.

## 2019-03-02 NOTE — Telephone Encounter (Signed)
Yes, let's increase olmesartan to 20 mg daily.

## 2019-03-23 ENCOUNTER — Encounter: Payer: Medicare Other | Admitting: Cardiovascular Disease

## 2019-03-30 ENCOUNTER — Other Ambulatory Visit: Payer: Self-pay

## 2019-03-30 ENCOUNTER — Ambulatory Visit (INDEPENDENT_AMBULATORY_CARE_PROVIDER_SITE_OTHER): Payer: Medicare Other | Admitting: Cardiovascular Disease

## 2019-03-30 ENCOUNTER — Encounter: Payer: Self-pay | Admitting: Cardiovascular Disease

## 2019-03-30 ENCOUNTER — Ambulatory Visit: Payer: Medicare Other | Admitting: Internal Medicine

## 2019-03-30 VITALS — BP 108/60 | HR 73 | Temp 97.1°F | Ht 61.0 in | Wt 174.0 lb

## 2019-03-30 DIAGNOSIS — Z953 Presence of xenogenic heart valve: Secondary | ICD-10-CM

## 2019-03-30 DIAGNOSIS — I441 Atrioventricular block, second degree: Secondary | ICD-10-CM

## 2019-03-30 DIAGNOSIS — I495 Sick sinus syndrome: Secondary | ICD-10-CM | POA: Diagnosis not present

## 2019-03-30 DIAGNOSIS — I10 Essential (primary) hypertension: Secondary | ICD-10-CM

## 2019-03-30 DIAGNOSIS — I2510 Atherosclerotic heart disease of native coronary artery without angina pectoris: Secondary | ICD-10-CM

## 2019-03-30 DIAGNOSIS — Z950 Presence of cardiac pacemaker: Secondary | ICD-10-CM | POA: Diagnosis not present

## 2019-03-30 DIAGNOSIS — I4719 Other supraventricular tachycardia: Secondary | ICD-10-CM

## 2019-03-30 DIAGNOSIS — I471 Supraventricular tachycardia: Secondary | ICD-10-CM

## 2019-03-30 MED ORDER — OLMESARTAN MEDOXOMIL 20 MG PO TABS: 20.0000 mg | ORAL_TABLET | Freq: Every day | ORAL | 3 refills | Status: DC

## 2019-03-30 NOTE — Patient Instructions (Addendum)
Medication Instructions:  No changes *If you need a refill on your cardiac medications before your next appointment, please call your pharmacy*  Lab Work: None ordered If you have labs (blood work) drawn today and your tests are completely normal, you will receive your results only by: [facility] MyChart Message (if you have MyChart) OR . A paper copy in the mail If you have any lab test that is abnormal or we need to change your treatment, we will call you to review the results.  Testing/Procedures: Your physician has requested that you have an echocardiogram in April of 2021. Echocardiography is a painless test that uses sound waves to create images of your heart. It provides your doctor with information about the size and shape of your heart and how well your heart's chambers and valves are working. You may receive an ultrasound enhancing agent through an IV if needed to better visualize your heart during the echo.This procedure takes approximately one hour. There are no restrictions for this procedure. This will take place at the [address].    Follow-Up: At [facility], you and your health needs are our priority.  As part of our continuing mission to provide you with exceptional heart care, we have created designated Provider Care Teams.  These Care Teams include your primary Cardiologist (physician) and Advanced Practice Providers (APPs -  Physician Assistants and Nurse Practitioners) who all work together to provide you with the care you need, when you need it.  Your next appointment:   12 months  The format for your next appointment:   In Person  Provider:   Sanda Klein, MD

## 2019-03-30 NOTE — Progress Notes (Addendum)
Cardiology Office Note    Date:  03/30/2019   ID:  Tonya Glass, DOB 1945-09-24, MRN [id number]  PCP:  Tonya Side., FNP  Cardiologist:  Tonya Glass, M.D.; Tonya Klein, MD   Chief complaint: Pacemaker follow-up    History of Present Illness:  Tonya Glass is a 73 y.o. 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  She has minor CAD by previous angiography (40% mid LAD pre-AVR 2014).  She bears a diagnosis of paroxysmal atrial flutter.  From a cardiology point of view she is doing quite well.  She has occasional exertional dyspnea which she attributes to COPD.  NYHA functional class II.   Low back pain limits activity more than dyspnea.The patient specifically denies any chest pain at rest or with exertion, dyspnea at rest, orthopnea, paroxysmal nocturnal dyspnea, syncope, palpitations, focal neurological deficits, intermittent claudication, lower extremity edema, unexplained weight gain, cough, hemoptysis or wheezing.  She lives independently and takes care of her own household.   Blood pressure control has been good.  Her typical systolic blood pressure is in the 120s-low 130s, highest recently recorded was 143.  Her diastolic blood pressure is consistently less than 70.  She currently does not smoke with does have a 40-pack-year history of smoking.  Pacemaker interrogation shows normal device function.  Her [employer] ingenio dual-chamber pacemaker was implanted in 2014 and has roughly 3 years of estimated generator longevity.  Generator and lead parameters are excellent.  She has 94% atrial pacing and 35% ventricular pacing.  Heart  rate histograms are appropriate for her relatively sedentary lifestyle.  Presenting rhythm is AV sequential pacing.  The underlying rhythm is sinus bradycardia.  She is not pacemaker dependent.  We turned on rhythmIQ today to help reduce the amount of ventricular pacing.   She has had 1 episode of 5 beat nonsustained VT since the last pacemaker download.  She has had several episodes of atrial tachycardia.  Most of these are very brief measured in seconds or couple of minutes, but one of them lasted for 85 minutes (September 30, asymptomatic).  The atrial rate is around 150 bpm, but the ventricular average rate was in the 90s.  The atrial electrogram suggests a regular reentry type rhythm, but it is too slow to be categorized as atrial flutter.  She reports that her glycemic control is good (the most recent hemoglobin A1c I have available for review is from March 2019 and was indeed excellent at 5.6%).  She reports that Dr. Dustin Glass also checked her lipid profile and that was also favorable.  She has a follow-up appoint with him in December when she is to have repeat labs.  Most recent creatinine that I have on record was 1.28 on March 24, 2018  Past Medical History:  Diagnosis Date   Anxiety    Aortic valve stenosis 10/10/2016   Arthritis    "fingers" (07/24/2017)   CHF (congestive heart failure) (HCC)    COPD (chronic obstructive pulmonary disease) (HCC)    Depression    Gallstones    Heart murmur    High cholesterol    History of kidney stones    Hypertension    Hypothyroidism  Migraines    "none in years til 01/2013-02/2013; none since"  (07/24/2017)   Orthopnoea    "just since open heart surgery" (02/25/2013)   Pacemaker    [employer]    Pneumonia ~ 2012   "walking pneumonia"   Pneumonia 07/23/2017   Stroke ([place]) 2001   denies residual on 02/25/2013   Symptomatic hypotension 10/10/2016   Syncope and collapse    "prior to open heart surgery"  (02/25/2013)   Type II diabetes mellitus ([place])     Past Surgical History:  Procedure Laterality Date   BREAST SURGERY Right    "clamped blood vessel and stitched it up; in [place]"   [place]  01/26/2013   "cow valve put in; widened the second valve" (02/25/2013) - [place], [place]   CATARACT EXTRACTION W/ INTRAOCULAR LENS  IMPLANT, BILATERAL Bilateral 04/2017   CHOLECYSTECTOMY OPEN  1971   CYST EXCISION     "taken off my chest"   INSERT / REPLACE / REMOVE PACEMAKER  01/19/2013   [place] RESECTION  2009   in [place]   VEIN SURGERY Left    LLE; cut leg open and took out piece of vein that was protruding thru skin; stitched it up; done in [place]    Current Medications: Outpatient Medications Prior to Visit  Medication Sig Dispense Refill   albuterol (PROVENTIL HFA;VENTOLIN HFA) 108 (90 BASE) MCG/ACT inhaler Inhale 2 puffs into the lungs. Use as directed -  With exercise     alendronate (FOSAMAX) 70 MG tablet Take 70 mg by mouth every 7 (seven) days. SUNDAYS     aspirin 81 MG tablet Take 1 tablet (81 mg total) by mouth daily.     atorvastatin (LIPITOR) 40 MG tablet Take 40 mg by mouth daily.     carvedilol (COREG) 12.5 MG tablet Take 1 tablet (12.5 mg total) 2 (two) times daily by mouth. 180 tablet 3   cloNIDine (CATAPRES) 0.2 MG tablet Take 1 tablet (0.2 mg total) by mouth at bedtime. 30 tablet 11   Ferrous Sulfate (IRON) 325 (65 Fe) MG TABS Take 65 mg by mouth daily.  0   FLUoxetine (PROZAC) 40 MG capsule Take 40 mg by mouth daily.      furosemide (LASIX) 40 MG tablet Take 40 mg by mouth daily.     gabapentin (NEURONTIN) 100 MG capsule TAKE 3 CAPSULES BY MOUTH EVERY DAY AT BEDTIME     guaiFENesin (MUCINEX) 600 MG 12 hr tablet Take 600 mg by mouth daily.     hydrocortisone 2.5 % cream Apply 1 application topically daily.     levothyroxine (SYNTHROID, LEVOTHROID) 50  MCG tablet Take 50 mcg by mouth daily before breakfast.     metFORMIN (GLUCOPHAGE) 500 MG tablet Take 1,000 mg by mouth 2 (two) times daily.      Prenatal Vit-Fe Fumarate-FA (PREPLUS) 27-1 MG TABS Take 1 tablet by mouth daily.  3   SYMBICORT 160-4.5 MCG/ACT inhaler INHALE 2 PUFFS BY MOUTH TWICE DAILY IN THE MORNING AND IN THE EVENING     temazepam (RESTORIL) 30 MG capsule Take 30 mg by mouth at bedtime.      traMADol (ULTRAM) 50 MG tablet Take 50 mg by mouth as needed for moderate pain.      olmesartan (BENICAR) 20 MG tablet Take 20 mg by mouth daily.     cephALEXin (KEFLEX) 500 MG capsule Take 1 capsule (500  mg total) by mouth 4 (four) times daily. (Patient not taking: Reported on 03/30/2019) 20 capsule 0   olmesartan (BENICAR) 20 MG tablet Take 0.5 tablets (10 mg total) by mouth daily. (Patient not taking: Reported on 03/30/2019) 45 tablet 3   No facility-administered medications prior to visit.      Allergies:   Patient has no known allergies.   Social History   Socioeconomic History   Marital status: Widowed    Spouse name: Not on file   Number of children: 2   Years of education: dental sch   Highest education level: Not on file  Occupational History   Occupation: retired  Scientist, product/process development strain: Not on file   Food insecurity    Worry: Not on file    Inability: Not on Lexicographer needs    Medical: Not on file    Non-medical: Not on file  Tobacco Use   Smoking status: Former Smoker    Packs/day: 2.00    Years: 40.00    Pack years: 80.00    Types: Cigarettes    Quit date: 05/29/1999    Years since quitting: 19.8   Smokeless tobacco: Never Used  Substance and Sexual Activity   Alcohol use: No   Drug use: No   Sexual activity: Yes  Lifestyle   Physical activity    Days per week: Not on file    Minutes per session: Not on file   Stress: Not on file  Relationships   Social connections    Talks on phone: Not on file     Gets together: Not on file    Attends religious service: Not on file    Active member of club or organization: Not on file    Attends meetings of clubs or organizations: Not on file    Relationship status: Not on file  Other Topics Concern   Not on file  Social History Narrative   Not on file     Family History:  The patient's family history includes Breast cancer in her maternal aunt; Colon polyps in her brother and daughter; Diabetes in her maternal aunt and paternal aunt; Heart Problems in her father; Hypertension in her brother; Lung cancer in her mother; Skin cancer in her brother.   ROS:   Please see the history of present illness.    ROS All other systems are reviewed and are negative.   PHYSICAL EXAM:   VS:  BP 108/60    Pulse 73    Temp (!) 97.1 F (36.2 C)    Ht 5\' 1"  (1.549 m)    Wt 174 lb (78.9 kg)    BMI 32.88 kg/m      General: Alert, oriented x3, no distress, only obese.  The left subclavian pacemaker site appears healthy. Head: no evidence of trauma, PERRL, EOMI, no exophtalmos or lid lag, no myxedema, no xanthelasma; normal ears, nose and oropharynx Neck: normal jugular venous pulsations and no hepatojugular reflux; brisk carotid pulses without delay and no carotid bruits Chest: clear to auscultation, no signs of consolidation by percussion or palpation, normal fremitus, symmetrical and full respiratory excursions Cardiovascular: normal position and quality of the apical impulse, regular rhythm, normal first and second heart sounds, no rubs or gallops.  There is a very distinct 3/6 early peaking systolic ejection murmur and a 3/6 decrescendo diastolic murmur of aortic insufficiency heard up and down the right sternal border. Abdomen: no tenderness or distention, no masses by  palpation, no abnormal pulsatility or arterial bruits, normal bowel sounds, no hepatosplenomegaly Extremities: no clubbing, cyanosis or edema; 2+ radial, ulnar and brachial pulses bilaterally;  2+ right femoral, posterior tibial and dorsalis pedis pulses; 2+ left femoral, posterior tibial and dorsalis pedis pulses; no subclavian or femoral bruits Neurological: grossly nonfocal Psych: Normal mood and affect    Wt Readings from Last 3 Encounters:  03/30/19 174 lb (78.9 kg)  02/21/19 173 lb (78.5 kg)  09/24/18 167 lb (75.8 kg)      Studies/Labs Reviewed:  ECHO 08/19/2017  - Left ventricle: The cavity size was normal. There was mild focal   basal hypertrophy of the septum. Systolic function was normal.   The estimated ejection fraction was in the range of 55% to 60%.   Wall motion was normal; there were no regional wall motion   abnormalities. Features are consistent with a pseudonormal left   ventricular filling pattern, with concomitant abnormal relaxation   and increased filling pressure (grade 2 diastolic dysfunction). - Ventricular septum: Septal motion showed paradox. These changes   are consistent with a post-thoracotomy state. - Aortic valve: A bioprosthesis was present. Transvalvular velocity   was increased more than expected, but unchanged from 2018. The   jet remains early peaking. There was mild to moderate   regurgitation. There was no significant perivalvular   regurgitation. Valve area (VTI): 1.24 cm^2. Valve area (Vmax):   1.16 cm^2. Valve area (Vmean): 1.12 cm^2. - Mitral valve: Calcified annulus. Mildly thickened leaflets .   There was mild regurgitation. - Left atrium: The atrium was mildly dilated. - Right ventricle: Systolic function was mildly reduced. - Right atrium: The atrium was mildly dilated. - Tricuspid valve: There was mild-moderate regurgitation directed   centrally. Diastolic regurgitation was present. - Pulmonary arteries: PA peak pressure: 36 mm Hg (S).  EKG:  EKG is ordered today.  It shows AV sequentially paced rhythm  ASSESSMENT:    1. S/P aortic valve replacement with bioprosthetic valve   2. SSS (sick sinus syndrome) ([place])     3. Pacemaker   4. Essential hypertension   5. Coronary artery disease involving native coronary artery of native heart without angina pectoris      PLAN:  In order of problems listed above:  1. SSS: She is not pacemaker dependent.  Although reportedly her pacemaker was implanted for second degree AV block, she uses it primarily due to sinus node dysfunction.  Heart rate histogram is appropriate for her sedentary lifestyle..  2. PPM: Normal device function.  Remote downloads to be continued every 3 months.  Rhythm IQ was turned on today to try to minimize the prevalence of ventricular pacing. 3. S/p AVR: Plan to repeat an echocardiogram next spring in April (2-year interval) to evaluate left ventricular size and systolic function in the presence of moderate aortic insufficiency, also to reevaluate the gradients across the prosthesis. 4. HTN: Well-controlled 5. Coronary atherosclerosis was noted in the LAD and the right coronary artery on CT scan from November 2019.  She does not have angina pectoris. 6. PAT: lengthy, but without RVR due to second degree AV block, asymptomatic. Not true atrial flutter and atrial fibrillation has not been recorded. Anticoagulation is not indicated.   Medication Adjustments/Labs and Tests Ordered: Current medicines are reviewed at length with the patient today.  Concerns regarding medicines are outlined above.  Medication changes, Labs and Tests ordered today are listed in the Patient Instructions below. Patient Instructions  Medication Instructions:  No changes *  If you need a refill on your cardiac medications before your next appointment, please call your pharmacy*  Lab Work: None ordered If you have labs (blood work) drawn today and your tests are completely normal, you will receive your results only by:  [place] (if you have MyChart) OR  A paper copy in the mail If you have any lab test that is abnormal or we need to change your treatment, we  will call you to review the results.  Testing/Procedures: Your physician has requested that you have an echocardiogram in April of 2021. Echocardiography is a painless test that uses sound waves to create images of your heart. It provides your doctor with information about the size and shape of your heart and how well your hearts chambers and valves are working. You may receive an ultrasound enhancing agent through an IV if needed to better visualize your heart during the echo.This procedure takes approximately one hour. There are no restrictions for this procedure. This will take place at the [address].    Follow-Up: At [facility], you and your health needs are our priority.  As part of our continuing mission to provide you with exceptional heart care, we have created designated Provider Care Teams.  These Care Teams include your primary Cardiologist (physician) and Advanced Practice Providers (APPs -  Physician Assistants and Nurse Practitioners) who all work together to provide you with the care you need, when you need it.  Your next appointment:   12 months  The format for your next appointment:   In Person  Provider:   Sanda Klein, MD      Signed, Tonya Klein, MD  03/30/2019 3:34 PM    [facility] [address], [place], [place]  [address] Phone: [phone]; Fax: [phone]

## 2019-04-03 ENCOUNTER — Telehealth: Payer: Self-pay | Admitting: Cardiovascular Disease

## 2019-04-03 NOTE — Telephone Encounter (Signed)
Pt c/o medication issue:  1. Name of Medication: furosemide (LASIX) 40 MG tablet  2. How are you currently taking this medication (dosage and times per day)? As directed   3. Are you having a reaction (difficulty breathing--STAT)? no  4. What is your medication issue? Frequent urination  The patient wanted to know if she could reduce the dosage of the lasix. She says she is constantly using the restroom, and can barely go anywhere because of it.

## 2019-04-04 NOTE — Telephone Encounter (Signed)
Yes, we can definitely try to reduce the diuretic. I would suggest reducing it to just 3 days a week, for example Monday Wednesday Friday. Please have her continue to weigh herself on a daily basis, and let us know if she gains more than 5 pounds from her current weight.

## 2019-04-06 MED ORDER — FUROSEMIDE 40 MG PO TABS: ORAL_TABLET | ORAL | 5 refills | Status: DC

## 2019-04-06 NOTE — Telephone Encounter (Signed)
Patient has been made aware and verbalized her understanding.  

## 2019-04-08 ENCOUNTER — Telehealth: Payer: Self-pay | Admitting: Cardiovascular Disease

## 2019-04-08 NOTE — Telephone Encounter (Signed)
New Message  Patient is calling to get order for echocardiogram sent over to [facility] imagining. States that she would rather have it done there. Please assist.

## 2019-04-08 NOTE — Telephone Encounter (Signed)
The patient has been made aware that [facility] does not do ECHOs. She will call to get it scheduled at the [facility].

## 2019-04-08 NOTE — Telephone Encounter (Signed)
Okay to order ECHO at  imaging?  I am unsure if they do them.

## 2019-04-13 ENCOUNTER — Ambulatory Visit (INDEPENDENT_AMBULATORY_CARE_PROVIDER_SITE_OTHER): Payer: Medicare Other | Admitting: *Deleted

## 2019-04-13 DIAGNOSIS — I495 Sick sinus syndrome: Secondary | ICD-10-CM

## 2019-04-13 LAB — CUP PACEART REMOTE DEVICE CHECK
Battery Remaining Longevity: 36 mo
Battery Remaining Percentage: 60 %
Brady Statistic RA Percent Paced: 95 %
Brady Statistic RV Percent Paced: 45 %
Date Time Interrogation Session: 20201130044100
Implantable Lead Implant Date: 20140908
Implantable Lead Implant Date: 20140908
Implantable Lead Location: 753859
Implantable Lead Location: 753860
Implantable Lead Model: 4135
Implantable Lead Model: 4136
Implantable Lead Serial Number: 29485718
Implantable Lead Serial Number: 29487623
Implantable Pulse Generator Implant Date: 20140908
Lead Channel Impedance Value: 556 Ohm
Lead Channel Impedance Value: 834 Ohm
Lead Channel Pacing Threshold Amplitude: 0.7 V
Lead Channel Pacing Threshold Pulse Width: 0.4 ms
Lead Channel Setting Pacing Amplitude: 2 V
Lead Channel Setting Pacing Amplitude: 2.5 V
Lead Channel Setting Pacing Pulse Width: 0.4 ms
Lead Channel Setting Sensing Sensitivity: 2.5 mV
Pulse Gen Serial Number: 160873

## 2019-05-06 NOTE — Progress Notes (Signed)
PPM remote 

## 2019-05-13 ENCOUNTER — Telehealth: Payer: Self-pay | Admitting: Cardiovascular Disease

## 2019-05-13 NOTE — Telephone Encounter (Signed)
New Message  Pt c/o Syncope: STAT if syncope occurred within 30 minutes and pt complains of lightheadedness High Priority if episode of passing out, completely, today or in last 24 hours   1. Did you pass out today? No but felt light she was going to  2. When is the last time you passed out? Months ago   3. Has this occurred multiple times? Just started   4. Did you have any symptoms prior to passing out? No

## 2019-05-13 NOTE — Telephone Encounter (Signed)
Spoke with pt who states she had presyncopal episode today. She states she was running errands earlier today and had been out for possibly 3-4 hours without anything to eat. She states she felt fine until she got home and ate. She states she ate a tostada from [employer] and when she got up this when she had presyncopal episode--lightheadedness. She feels better now. Pt has Hx DM2. Pt checked her CBG 2 hrs ago; CBG-159. BP 121/55 and HR 70 today. Pt has Hx pacemaker. She she has had actual syncopal episode in the past but this was months ago.  Pt confirmed she is on carvedilol, olmesartan, clonidine as prescribed. She states she takes Lasix 40 mg qd vs M-W-F on current med list. Pt states last thing she ate was a rice cake with peanut butter just before calling [facility]. She states she was on Atkins diet to lose weight but has since stopped, although she is still watching what she eat to keep the weight off. Unsure if symptoms related to pt activities today or cardiac-related. Attempted to set pt up for appt with APP. Pt states she saw Dr. Sallyanne Kuster recently and does not want appt at this time. Informed ptthat encounter to be forwarded to Dr. Sallyanne Kuster. Advised pt not to go extended periods of time without food especially with activity. Advised to monitor CBG, BP, HR regularly and if she is still having similar symptoms she may need to call to set up appt. Advised she may need to call 911 during episode

## 2019-05-14 NOTE — Telephone Encounter (Signed)
The patient has been called and made aware that the echo will be moved up and someone from scheduling will reach out to her.  She did state that she feels much better now.

## 2019-05-14 NOTE — Telephone Encounter (Signed)
I agree that lack of sufficient intake may be the reason for syncope. We had planned a follow up echo in April, but considering these events I think it's best we do that sooner, first available please. She is not pacer dependent.

## 2019-05-27 ENCOUNTER — Ambulatory Visit (HOSPITAL_COMMUNITY): Payer: Medicare Other | Attending: Cardiology

## 2019-05-27 ENCOUNTER — Other Ambulatory Visit: Payer: Self-pay

## 2019-05-27 DIAGNOSIS — Z953 Presence of xenogenic heart valve: Secondary | ICD-10-CM | POA: Diagnosis present

## 2019-06-23 ENCOUNTER — Telehealth: Payer: Self-pay | Admitting: Cardiovascular Disease

## 2019-06-23 NOTE — Telephone Encounter (Signed)
   STAT if patient feels like he/she is going to faint   1) Are you dizzy now? no  2) Do you feel faint or have you passed out? no  3) Do you have any other symptoms? no  4) Have you checked your HR and BP (record if available)?  109/43 HR 70   Patient states she has felt dizzy once 3 weeks ago and again today. She would like to know if her medication should be adjusted.

## 2019-06-23 NOTE — Telephone Encounter (Signed)
Returned call to patient-she states she has started to experience "dizzy spells" again.   This has happened x 2, getting out of the car and getting up to go in the kitchen-denies sudden, quick movements when changing positions.   She states she starts to become dizzy, her legs get wobbly, she holds onto something and the dizziness resolves.   Denies CP, SOB, nausea, etc.   BP today 109/43, HR 70.   She was concerned about her BP being too low and wondering if her Benicar needs to be adjusted.   She states she is eating and drinking okay as well.     Per chart review: Hx: Aortic root repair, AVR, PPM Patient was having spells 12/30-echo moved up, stable.     Advised would send Dr. Sallyanne Kuster message to review.  Patient verbalized understanding.

## 2019-06-23 NOTE — Telephone Encounter (Signed)
Yes, please reduce the olmesartan to 10 mg once daily.  It will take about a week to see the full impact on her blood pressure.  Please ask her to touch base with [place] again at that time.

## 2019-06-24 NOTE — Telephone Encounter (Signed)
Spoke to patient-made aware of Dr. Loletha Grayer recommendations.  She states her BP was high last night and this AM.  Last pm 189/78 this AM 166/71.   She has not experienced anymore dizzy spells but she is being very careful when moving around.    Advised to continue current dose, monitor and log BP readings.   Patient agreed with plan and states she will call back on Friday or earlier if BP is running low again or if she has anymore dizzy spells.   Advised I would make Dr. Loletha Grayer aware.

## 2019-06-24 NOTE — Telephone Encounter (Signed)
Well, Ok then, stay the course and fill [place] in on BP. [employer] 110-130/60-80.

## 2019-07-13 ENCOUNTER — Ambulatory Visit (INDEPENDENT_AMBULATORY_CARE_PROVIDER_SITE_OTHER): Payer: Medicare Other | Admitting: *Deleted

## 2019-07-13 DIAGNOSIS — I495 Sick sinus syndrome: Secondary | ICD-10-CM | POA: Diagnosis not present

## 2019-07-13 LAB — CUP PACEART REMOTE DEVICE CHECK
Battery Remaining Longevity: 36 mo
Battery Remaining Percentage: 57 %
Brady Statistic RA Percent Paced: 96 %
Brady Statistic RV Percent Paced: 54 %
Date Time Interrogation Session: 20210301044100
Implantable Lead Implant Date: 20140908
Implantable Lead Implant Date: 20140908
Implantable Lead Location: 753859
Implantable Lead Location: 753860
Implantable Lead Model: 4135
Implantable Lead Model: 4136
Implantable Lead Serial Number: 29485718
Implantable Lead Serial Number: 29487623
Implantable Pulse Generator Implant Date: 20140908
Lead Channel Impedance Value: 549 Ohm
Lead Channel Impedance Value: 857 Ohm
Lead Channel Pacing Threshold Amplitude: 0.7 V
Lead Channel Pacing Threshold Pulse Width: 0.4 ms
Lead Channel Setting Pacing Amplitude: 2 V
Lead Channel Setting Pacing Amplitude: 2.5 V
Lead Channel Setting Pacing Pulse Width: 0.4 ms
Lead Channel Setting Sensing Sensitivity: 2.5 mV
Pulse Gen Serial Number: 160873

## 2019-07-14 NOTE — Progress Notes (Signed)
PPM Remote  

## 2019-07-16 ENCOUNTER — Telehealth: Payer: Self-pay | Admitting: Emergency Medicine

## 2019-07-16 MED ORDER — APIXABAN 5 MG PO TABS: 5.0000 mg | ORAL_TABLET | Freq: Two times a day (BID) | ORAL | 1 refills | Status: DC

## 2019-07-16 NOTE — Telephone Encounter (Signed)
Transmission received 07/16/19 at 0859 am that showed ongoing AFL.

## 2019-07-16 NOTE — Telephone Encounter (Signed)
Patient contacted due to alert received for ongoing AFL with controlled v-rates since 07/15/19 at 1:41 am+. Patient is asymptomatic and reports she has not missed any doses of her medications. Will have patient send manual transmission to assess current rhythm. Transmission shows what appears to be ongoing AFL with no OAC.

## 2019-07-16 NOTE — Telephone Encounter (Signed)
The patient has been called and set up with a virtual appointment on 3/10 with Dr. Sallyanne Kuster.  Eliquis 5 mg bid has been sent in for her. She stated that she will start in tomorrow 3/5. She has been advised to discontinue the Aspirin once the Eliquis has been started.  She will call back if anything further is needed before her appointment.

## 2019-07-16 NOTE — Telephone Encounter (Signed)
Thanks, Tonya Glass, clearly A flutter. Please let me know what repeat transmission shows. [name], can we please schedule a visit (virtual w me is fine, or in office with Dr. Debara Pickett, APP at [place] or even AFib clinic if she prefers in person). Need to start anticoagulation. Prefer Eliquis 5 mg twice daily. If we can start it today that would be great. If the arrhythmia proves to be persistent, we can try to get rid of it. If we start Eliquis today, we can consider overdrive pacing with her device after a couple of days of Eliquis (next Monday for example). If there is delay in starting the anticoagulation, will need 3 weeks of Eliquis before we can overdrive or schedule for  cardioversion. Unlikely to become symptomatic since she has complete heart block.

## 2019-07-16 NOTE — Telephone Encounter (Signed)
Thanks

## 2019-07-22 ENCOUNTER — Telehealth: Payer: Self-pay | Admitting: *Deleted

## 2019-07-22 ENCOUNTER — Encounter: Payer: Self-pay | Admitting: Cardiovascular Disease

## 2019-07-22 ENCOUNTER — Telehealth (INDEPENDENT_AMBULATORY_CARE_PROVIDER_SITE_OTHER): Payer: Medicare Other | Admitting: Cardiovascular Disease

## 2019-07-22 VITALS — BP 157/85 | HR 83 | Temp 98.0°F | Ht 61.0 in | Wt 162.0 lb

## 2019-07-22 DIAGNOSIS — Z9889 Other specified postprocedural states: Secondary | ICD-10-CM

## 2019-07-22 DIAGNOSIS — I495 Sick sinus syndrome: Secondary | ICD-10-CM

## 2019-07-22 DIAGNOSIS — I2510 Atherosclerotic heart disease of native coronary artery without angina pectoris: Secondary | ICD-10-CM

## 2019-07-22 DIAGNOSIS — I712 Thoracic aortic aneurysm, without rupture, unspecified: Secondary | ICD-10-CM

## 2019-07-22 DIAGNOSIS — Z953 Presence of xenogenic heart valve: Secondary | ICD-10-CM

## 2019-07-22 DIAGNOSIS — I484 Atypical atrial flutter: Secondary | ICD-10-CM | POA: Diagnosis not present

## 2019-07-22 DIAGNOSIS — E785 Hyperlipidemia, unspecified: Secondary | ICD-10-CM | POA: Diagnosis not present

## 2019-07-22 DIAGNOSIS — Z950 Presence of cardiac pacemaker: Secondary | ICD-10-CM

## 2019-07-22 DIAGNOSIS — I10 Essential (primary) hypertension: Secondary | ICD-10-CM

## 2019-07-22 DIAGNOSIS — Z7901 Long term (current) use of anticoagulants: Secondary | ICD-10-CM

## 2019-07-22 NOTE — Progress Notes (Signed)
Virtual Visit via Telephone Note   This visit type was conducted due to national recommendations for restrictions regarding the COVID-19 Pandemic (e.g. social distancing) in an effort to limit this patient's exposure and mitigate transmission in our community.  Due to her co-morbid illnesses, this patient is at least at moderate risk for complications without adequate follow up.  This format is felt to be most appropriate for this patient at this time.  The patient did not have access to video technology/had technical difficulties with video requiring transitioning to audio format only (telephone).  All issues noted in this document were discussed and addressed.  No physical exam could be performed with this format.  Please refer to the patient's chart for her  consent to telehealth for [facility].   The patient was identified using 2 identifiers.  Date:  07/22/2019   ID:  Tonya Glass, DOB 06/20/1945, MRN [id number]  Patient Location: Home Provider Location: Home  PCP:  Sonia Side., FNP  Cardiologist:  Pixie Casino, MD  Electrophysiologist:  None   Evaluation Performed:  Follow-Up Visit  Chief Complaint:  Pacemaker  History of Present Illness:    Tonya Glass is a 74 y.o. 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  She has minor CAD by previous angiography (40% mid LAD pre-AVR 2014).    She has type 2 diabetes mellitus on monotherapy with Metformin and has a history of diastolic heart failure.  She has "a cold".  Primarily manifesting as a runny nose, without fever and with minimal sore throat.  She was prescribed Singulair, with  relief.  She has been taking some Sudafed which helps to drive her nose but then wears off.  Her most recent pacemaker download from March 1 shows normal device function and estimated 3 years of battery longevity.  She has 96% atrial pacing with good heart rate histogram distribution and 54% ventricular pacing.  There have been no episodes of atrial arrhythmia since the last bowel movement November.  She had one 17 beat long episode of nonsustained VT a year ago, none since.  Subsequently received an alert on July 15, 2019 for atrial flutter with 4: 1 AV conduction.  Eliquis was started and aspirin was stopped.  Download the next day still shows atrial flutter, still well rate controlled.  Echocardiogram on January 13 shows normal left ventricular systolic function and expected gradients across the prosthetic aortic valve (mean gradient 19 mmHg), unchanged mild-moderate perivalvular leak.  The patient does not have symptoms concerning for COVID-19 infection (fever, chills, cough, or new shortness of breath).    Past Medical History:  Diagnosis Date   Anxiety    Aortic valve stenosis 10/10/2016   Arthritis    "fingers" (07/24/2017)   CHF (congestive heart failure) (HCC)    COPD (chronic obstructive pulmonary disease) (HCC)    Depression    Gallstones    Heart murmur    High cholesterol    History of kidney stones    Hypertension    Hypothyroidism    Migraines    "none in years til 01/2013-02/2013; none since"  (07/24/2017)   Orthopnoea    "just since open heart surgery" (02/25/2013)   Pacemaker  [employer]    Pneumonia ~ 2012   "walking pneumonia"   Pneumonia 07/23/2017   Stroke ([place]) 2001   denies residual on 02/25/2013   Symptomatic hypotension 10/10/2016   Syncope and collapse    "prior to open heart surgery" (02/25/2013)   Type II diabetes mellitus ([place])    Past Surgical History:  Procedure Laterality Date   BREAST SURGERY Right    "clamped  blood vessel and stitched it up; in [place]"   [place]  01/26/2013   "cow valve put in; widened the second valve" (02/25/2013) - [place], [place]   CATARACT EXTRACTION W/ INTRAOCULAR LENS  IMPLANT, BILATERAL Bilateral 04/2017   CHOLECYSTECTOMY OPEN  1971   CYST EXCISION     "taken off my chest"   INSERT / REPLACE / REMOVE PACEMAKER  01/19/2013   [place] RESECTION  2009   in [place]   VEIN SURGERY Left    LLE; cut leg open and took out piece of vein that was protruding thru skin; stitched it up; done in [place]     Current Meds  Medication Sig   albuterol (PROVENTIL HFA;VENTOLIN HFA) 108 (90 BASE) MCG/ACT inhaler Inhale 2 puffs into the lungs. Use as directed -  With exercise   alendronate (FOSAMAX) 70 MG tablet Take 70 mg by mouth every 7 (seven) days. SUNDAYS   apixaban (ELIQUIS) 5 MG TABS tablet Take 1 tablet (5 mg total) by mouth 2 (two) times daily.   atorvastatin (LIPITOR) 40 MG tablet Take 40 mg by mouth daily.   carvedilol (COREG) 12.5 MG tablet Take 1 tablet (12.5 mg total) 2 (two) times daily by mouth.   cloNIDine (CATAPRES) 0.2 MG tablet Take 1 tablet (0.2 mg total) by mouth at bedtime.   Ferrous Sulfate (IRON) 325 (65 Fe) MG TABS Take 65 mg by mouth daily.   FLUoxetine (PROZAC) 40 MG capsule Take 40 mg by mouth daily.    furosemide (LASIX) 40 MG tablet Take one tablet Monday, Wednesday and Friday (Patient taking differently: 40 mg daily. )   gabapentin (NEURONTIN) 100 MG capsule TAKE 3 CAPSULES BY MOUTH EVERY DAY AT BEDTIME   guaiFENesin (MUCINEX) 600 MG 12 hr tablet Take 600 mg by mouth daily.   hydrocortisone 2.5 % cream Apply 1 application topically daily.   levothyroxine (SYNTHROID, LEVOTHROID) 50 MCG tablet Take 50 mcg by mouth daily before breakfast.   metFORMIN (GLUCOPHAGE) 500 MG tablet Take 1,000 mg by mouth 2 (two) times daily.    montelukast  (SINGULAIR) 10 MG tablet Take 10 mg by mouth at bedtime.   olmesartan (BENICAR) 20 MG tablet Take 1 tablet (20 mg total) by mouth daily.   Prenatal Vit-Fe Fumarate-FA (PREPLUS) 27-1 MG TABS Take 1 tablet by mouth daily.   SYMBICORT 160-4.5 MCG/ACT inhaler INHALE 2 PUFFS BY MOUTH TWICE DAILY IN THE MORNING AND IN THE EVENING   temazepam (RESTORIL) 30 MG capsule Take 30 mg by mouth at bedtime.    traMADol (ULTRAM) 50 MG tablet Take 50 mg by mouth as needed for moderate pain.      Allergies:   Patient has no known allergies.   Social History   Tobacco Use   Smoking status: Former Smoker    Packs/day: 2.00    Years: 40.00    Pack years: 80.00    Types: Cigarettes    Quit date: 05/29/1999    Years since quitting:  20.1   Smokeless tobacco: Never Used  Substance Use Topics   Alcohol use: No   Drug use: No     Family Hx: The patient's family history includes Breast cancer in her maternal aunt; Colon polyps in her brother and daughter; Diabetes in her maternal aunt and paternal aunt; Heart Problems in her father; Hypertension in her brother; Lung cancer in her mother; Skin cancer in her brother.  ROS:   Please see the history of present illness.     All other systems reviewed and are negative.   Prior CV studies:   The following studies were reviewed today:  Echo January 2021 1. Left ventricular ejection fraction, by visual estimation, is 55 to  60%. The left ventricle has normal function. There is no left ventricular  hypertrophy.  2. Elevated left ventricular end-diastolic pressure.  3. Left ventricular diastolic parameters are consistent with Grade I  diastolic dysfunction (impaired relaxation).  4. The left ventricle has no regional wall motion abnormalities.  5. Global right ventricle has normal systolic function.The right  ventricular size is normal. No increase in right ventricular wall  thickness.  6. Left atrial size was mildly dilated.  7. Right atrial  size was normal.  8. The mitral valve is normal in structure. Trivial mitral valve  regurgitation. No evidence of mitral stenosis.  9. The tricuspid valve is normal in structure.  10. The aortic valve bioprosthesis was not well visualized. 73mm Perimount  magna ease bioprosthetic aortic valve valve is present in the aortic  position. Aortic valve mean gradient measures 19.0 mmHg. Aortic valve peak  gradient measures 31.1 mmHg. Aortic  valve area, by VTI measures 0.81 cm. There is mild perivalvular aortic  insufficiency.  11. The pulmonic valve was normal in structure. Pulmonic valve  regurgitation is not visualized.  12. Mildly elevated pulmonary artery systolic pressure.  13. The inferior vena cava is normal in size with greater than 50%  respiratory variability, suggesting right atrial pressure of 3 mmHg.  14. Compared to study of 2019, the prosthetic AV mean gradient has  improved from 25 to 81mmHg. There remains mild perivalvular AI. PASP has  now normalized compared to 26mmHg in 2019.   Labs/Other Tests and Data Reviewed:    EKG:  The patient's intracardiac electrogram shows atrial paced, ventricular sensed rhythm  Recent Labs: No results found for requested labs within last 8760 hours.   Recent Lipid Panel No results found for: CHOL, TRIG, HDL, CHOLHDL, LDLCALC, LDLDIRECT  Wt Readings from Last 3 Encounters:  07/22/19 162 lb (73.5 kg)  03/30/19 174 lb (78.9 kg)  02/21/19 173 lb (78.5 kg)     Objective:    Vital Signs:  BP (!) 157/85    Pulse 83    Temp 98 F (36.7 C)    Ht 5\' 1"  (1.549 m)    Wt 162 lb (73.5 kg)    BMI 30.61 kg/m    VITAL SIGNS:  reviewed Unable to examine  ASSESSMENT & PLAN:    1. AFlutter: This was reported in her medical records a few years ago as well.  Asymptomatic.  Eliquis was started within roughly 24 hours of arrhythmia onset.  Asked her to do another download today.  If still in atrial flutter we can bring her to the office or the A.  fib clinic for an attempt at overdrive pacing. 2. Anticoagulation: Since this is a recurrent event, we will plan to keep her on anticoagulation indefinitely.  CHA2DS2-VASc score is  4 (age, DM, gender, HTN, vascular disease, history of CHF). 3. SSS: Heart rate histogram appears normal acceptable, especially considering her sedentary lifestyle. 4. Pacemaker: Normal device function.  Otherwise we will continue remote downloads every 3 months. 5. S/p AVR: Chronic mild-moderate perivalvular regurgitation, probably since device implantation.  Otherwise normal prosthetic valve function. 6. HTN: Blood pressure is a little higher, possibly due to the use of pseudoephedrine.  Asked her to avoid this and try using Allegra, Claritin or Zyrtec instead. 7. CAD: Moderate nonobstructive disease by previous cardiac catheterization before AVR with calcification noted in the LAD and RCA on CT scan from November 2019.  She does not have angina pectoris.  COVID-19 Education: The signs and symptoms of COVID-19 were discussed with the patient and how to seek care for testing (follow up with PCP or arrange E-visit).  The importance of social distancing was discussed today.  Time:   Today, I have spent 25 minutes with the patient with telehealth technology discussing the above problems.     Medication Adjustments/Labs and Tests Ordered: Current medicines are reviewed at length with the patient today.  Concerns regarding medicines are outlined above.   Tests Ordered: No orders of the defined types were placed in this encounter.   Medication Changes: No orders of the defined types were placed in this encounter.   Follow Up:  In Person 12 months  Signed, Sanda Klein, MD  07/22/2019 10:40 AM    [place]

## 2019-07-22 NOTE — Patient Instructions (Signed)
Medication Instructions:  You may take Fexofenadine 60 mg, Loratadine 10 mg or Cetirizine 10 mg daily for a runny nose.  *If you need a refill on your cardiac medications before your next appointment, please call your pharmacy*   Lab Work: None ordered If you have labs (blood work) drawn today and your tests are completely normal, you will receive your results only by: Marland Kitchen MyChart Message (if you have MyChart) OR . A paper copy in the mail If you have any lab test that is abnormal or we need to change your treatment, we will call you to review the results.   Testing/Procedures: None ordered   Follow-Up: At [facility], you and your health needs are our priority.  As part of our continuing mission to provide you with exceptional heart care, we have created designated Provider Care Teams.  These Care Teams include your primary Cardiologist (physician) and Advanced Practice Providers (APPs -  Physician Assistants and Nurse Practitioners) who all work together to provide you with the care you need, when you need it.  We recommend signing up for the patient portal called "MyChart".  Sign up information is provided on this After Visit Summary.  MyChart is used to connect with patients for Virtual Visits (Telemedicine).  Patients are able to view lab/test results, encounter notes, upcoming appointments, etc.  Non-urgent messages can be sent to your provider as well.   To learn more about what you can do with MyChart, go to [email or web address].    Your next appointment:   12 month(s)  The format for your next appointment:   In Person  Provider:   Sanda Klein, MD

## 2019-07-22 NOTE — Telephone Encounter (Signed)
Virtual Visit Pre-Appointment Phone Call  "(Name), I am calling you today to discuss your upcoming appointment. We are currently trying to limit exposure to the virus that causes COVID-19 by seeing patients at home rather than in the office."  1. "What is the BEST phone number to call the day of the visit?" - include this in appointment notes  2. "Do you have or have access to (through a family member/friend) a smartphone with video capability that we can use for your visit?" a. If yes - list this number in appt notes as "cell" (if different from BEST phone #) and list the appointment type as a VIDEO visit in appointment notes b. If no - list the appointment type as a PHONE visit in appointment notes  3. Confirm consent - "In the setting of the current Covid19 crisis, you are scheduled for a (phone or video) visit with your provider on (date) at (time).  Just as we do with many in-office visits, in order for you to participate in this visit, we must obtain consent.  If you'd like, I can send this to your mychart (if signed up) or email for you to review.  Otherwise, I can obtain your verbal consent now.  All virtual visits are billed to your insurance company just like a normal visit would be.  By agreeing to a virtual visit, we'd like you to understand that the technology does not allow for your provider to perform an examination, and thus may limit your provider's ability to fully assess your condition. If your provider identifies any concerns that need to be evaluated in person, we will make arrangements to do so.  Finally, though the technology is pretty good, we cannot assure that it will always work on either your or our end, and in the setting of a video visit, we may have to convert it to a phone-only visit.  In either situation, we cannot ensure that we have a secure connection.  Are you willing to proceed?"YES  4. Advise patient to be prepared - "Two hours prior to your appointment, go  ahead and check your blood pressure, pulse, oxygen saturation, and your weight (if you have the equipment to check those) and write them all down. When your visit starts, your provider will ask you for this information. If you have an Apple Watch or Kardia device, please plan to have heart rate information ready on the day of your appointment. Please have a pen and paper handy nearby the day of the visit as well."  5. Give patient instructions for MyChart download to smartphone OR [email or web address] as below if video visit (depending on what platform provider is using)  6. Inform patient they will receive a phone call 15 minutes prior to their appointment time (may be from unknown caller ID) so they should be prepared to answer    TELEPHONE CALL NOTE  Braeli Purves has been deemed a candidate for a follow-up tele-health visit to limit community exposure during the Covid-19 pandemic. I spoke with the patient via phone to ensure availability of phone/video source, confirm preferred email & phone number, and discuss instructions and expectations.  I reminded Elizabath Massey to be prepared with any vital sign and/or heart rhythm information that could potentially be obtained via home monitoring, at the time of her visit. I reminded Ovelia Stoy to expect a phone call prior to her visit.  Ricci Barker, RN 07/22/2019 5:14 PM   INSTRUCTIONS FOR DOWNLOADING THE Deloris Ping  APP TO SMARTPHONE  - The patient must first make sure to have activated MyChart and know their login information - If Apple, go to [employer] and type in MyChart in the search bar and download the app. If Android, ask patient to go to [employer] and type in Ruth in the search bar and download the app. The app is free but as with any other app downloads, their phone may require them to verify saved payment information or Apple/Android password.  - The patient will need to then log into the app with their MyChart username and  password, and select [facility] as their healthcare provider to link the account. When it is time for your visit, go to the MyChart app, find appointments, and click Begin Video Visit. Be sure to Select Allow for your device to access the Microphone and Camera for your visit. You will then be connected, and your provider will be with you shortly.  **If they have any issues connecting, or need assistance please contact MyChart service desk [phone])**  **If using a computer, in order to ensure the best quality for their visit they will need to use either of the following Internet Browsers: [employer], or Google Chrome**  IF USING DOXIMITY or DOXY.ME - The patient will receive a link just prior to their visit by text.     FULL LENGTH CONSENT FOR TELE-HEALTH VISIT   I hereby voluntarily request, consent and authorize [facility] and its employed or contracted physicians, physician assistants, nurse practitioners or other licensed health care professionals (the Practitioner), to provide me with telemedicine health care services (the "Services") as deemed necessary by the treating Practitioner. I acknowledge and consent to receive the Services by the Practitioner via telemedicine. I understand that the telemedicine visit will involve communicating with the Practitioner through live audiovisual communication technology and the disclosure of certain medical information by electronic transmission. I acknowledge that I have been given the opportunity to request an in-person assessment or other available alternative prior to the telemedicine visit and am voluntarily participating in the telemedicine visit.  I understand that I have the right to withhold or withdraw my consent to the use of telemedicine in the course of my care at any time, without affecting my right to future care or treatment, and that the Practitioner or I may terminate the telemedicine visit at any time. I  understand that I have the right to inspect all information obtained and/or recorded in the course of the telemedicine visit and may receive copies of available information for a reasonable fee.  I understand that some of the potential risks of receiving the Services via telemedicine include:  Marland Kitchen Delay or interruption in medical evaluation due to technological equipment failure or disruption; . Information transmitted may not be sufficient (e.g. poor resolution of images) to allow for appropriate medical decision making by the Practitioner; and/or  . In rare instances, security protocols could fail, causing a breach of personal health information.  Furthermore, I acknowledge that it is my responsibility to provide information about my medical history, conditions and care that is complete and accurate to the best of my ability. I acknowledge that Practitioner's advice, recommendations, and/or decision may be based on factors not within their control, such as incomplete or inaccurate data provided by me or distortions of diagnostic images or specimens that may result from electronic transmissions. I understand that the practice of medicine is not an exact science and that Practitioner makes no  warranties or guarantees regarding treatment outcomes. I acknowledge that I will receive a copy of this consent concurrently upon execution via email to the email address I last provided but may also request a printed copy by calling the office of [facility].    I understand that my insurance will be billed for this visit.   I have read or had this consent read to me. . I understand the contents of this consent, which adequately explains the benefits and risks of the Services being provided via telemedicine.  . I have been provided ample opportunity to ask questions regarding this consent and the Services and have had my questions answered to my satisfaction. . I give my informed consent for the services to be  provided through the use of telemedicine in my medical care  By participating in this telemedicine visit I agree to the above.

## 2019-08-12 ENCOUNTER — Telehealth: Payer: Self-pay | Admitting: *Deleted

## 2019-08-12 NOTE — Telephone Encounter (Signed)
Review of pacemaker download shows atrial flutter since March 13. If there has been no interruption in her anticoagulant this last month, will ask her to come in to Monday April 5 pacer clinic to attempt overdrive pacing.

## 2019-08-12 NOTE — Telephone Encounter (Signed)
Called the patient to check up on her after her recent download showed atrial flutter.   She stated that she did have to go to the ED last week for CHF symptoms but feels "wonderful" now.   She did have a fluttering sensation a few days ago but was asymptomatic. She has been asked to do a download and will get that done now.  She has a follow up with Dr. Debara Pickett in May but has been advised that we will call back if anything needs to change due to her download results today.   Medication changes per the patient from her PCP have been updated in Epic.  Glipizide 10 mg bid Metformin 500 mg once daily.

## 2019-08-13 ENCOUNTER — Encounter: Payer: Self-pay | Admitting: *Deleted

## 2019-08-13 NOTE — Telephone Encounter (Signed)
Thanks Mihai.  [place]

## 2019-08-13 NOTE — Telephone Encounter (Signed)
The patient called back to let us know that she has been on prednisone since her hospital visit and would like to know if this could affect her rhythm.   Her last does was yesterday

## 2019-08-13 NOTE — Telephone Encounter (Signed)
The patient has been set up with the atrial fib clinic for tomorrow, 4/2, at 1:30. She has been given directions and a phone number.

## 2019-08-13 NOTE — Telephone Encounter (Signed)
The patient has been notified that she was having atrial flutter episodes before she started the prednisone.

## 2019-08-14 ENCOUNTER — Other Ambulatory Visit: Payer: Self-pay

## 2019-08-14 ENCOUNTER — Encounter: Payer: Self-pay | Admitting: Cardiovascular Disease

## 2019-08-14 ENCOUNTER — Ambulatory Visit (HOSPITAL_COMMUNITY)
Admission: RE | Admit: 2019-08-14 | Discharge: 2019-08-14 | Disposition: A | Discharge status: Home / Self Care | Payer: Medicare Other | Source: Ambulatory Visit | Attending: Nurse Practitioner | Admitting: Nurse Practitioner

## 2019-08-14 ENCOUNTER — Encounter (HOSPITAL_COMMUNITY): Payer: Self-pay | Admitting: Nurse Practitioner

## 2019-08-14 VITALS — BP 134/80 | Ht 61.0 in | Wt 161.8 lb

## 2019-08-14 DIAGNOSIS — Z8249 Family history of ischemic heart disease and other diseases of the circulatory system: Secondary | ICD-10-CM | POA: Diagnosis not present

## 2019-08-14 DIAGNOSIS — Z79890 Hormone replacement therapy (postmenopausal): Secondary | ICD-10-CM | POA: Diagnosis not present

## 2019-08-14 DIAGNOSIS — E039 Hypothyroidism, unspecified: Secondary | ICD-10-CM | POA: Insufficient documentation

## 2019-08-14 DIAGNOSIS — I484 Atypical atrial flutter: Secondary | ICD-10-CM

## 2019-08-14 DIAGNOSIS — Z87891 Personal history of nicotine dependence: Secondary | ICD-10-CM | POA: Diagnosis not present

## 2019-08-14 DIAGNOSIS — Z950 Presence of cardiac pacemaker: Secondary | ICD-10-CM | POA: Diagnosis not present

## 2019-08-14 DIAGNOSIS — Z833 Family history of diabetes mellitus: Secondary | ICD-10-CM | POA: Insufficient documentation

## 2019-08-14 DIAGNOSIS — I4892 Unspecified atrial flutter: Secondary | ICD-10-CM | POA: Diagnosis present

## 2019-08-14 DIAGNOSIS — D6869 Other thrombophilia: Secondary | ICD-10-CM | POA: Diagnosis not present

## 2019-08-14 DIAGNOSIS — I2510 Atherosclerotic heart disease of native coronary artery without angina pectoris: Secondary | ICD-10-CM | POA: Insufficient documentation

## 2019-08-14 DIAGNOSIS — Z7984 Long term (current) use of oral hypoglycemic drugs: Secondary | ICD-10-CM | POA: Diagnosis not present

## 2019-08-14 DIAGNOSIS — I509 Heart failure, unspecified: Secondary | ICD-10-CM | POA: Diagnosis not present

## 2019-08-14 DIAGNOSIS — E119 Type 2 diabetes mellitus without complications: Secondary | ICD-10-CM | POA: Insufficient documentation

## 2019-08-14 DIAGNOSIS — I110 Hypertensive heart disease with heart failure: Secondary | ICD-10-CM | POA: Insufficient documentation

## 2019-08-14 DIAGNOSIS — Z7901 Long term (current) use of anticoagulants: Secondary | ICD-10-CM | POA: Insufficient documentation

## 2019-08-14 DIAGNOSIS — J449 Chronic obstructive pulmonary disease, unspecified: Secondary | ICD-10-CM | POA: Insufficient documentation

## 2019-08-14 DIAGNOSIS — Z79899 Other long term (current) drug therapy: Secondary | ICD-10-CM | POA: Diagnosis not present

## 2019-08-14 NOTE — Progress Notes (Signed)
Primary Care Physician: Sonia Side., FNP Referring Physician: Dr. Ardelle Anton League is a 74 y.o. female with a h/o  HTN, CAD, HF, DM, PPM that is here for Dr. Sallyanne Kuster to try overdrive pacing as atrial flutter was found on her device since March 13. This may have been precipitated by a URI. She is on eliquis 5 mg bid and there has been no interruption in its use. CHA2DS2VASc score of at least 6.  Today, she denies symptoms of palpitations, chest pain, shortness of breath, orthopnea, PND, lower extremity edema, dizziness, presyncope, syncope, or neurologic sequela. The patient is tolerating medications without difficulties and is otherwise without complaint today.   Past Medical History:  Diagnosis Date  . Anxiety   . Aortic valve stenosis 10/10/2016  . Arthritis    "fingers" (07/24/2017)  . CHF (congestive heart failure) ([place])   . COPD (chronic obstructive pulmonary disease) ([place])   . Depression   . Gallstones   . Heart murmur   . High cholesterol   . History of kidney stones   . Hypertension   . Hypothyroidism   . Migraines    "none in years til 01/2013-02/2013; none since"  (07/24/2017)  . Orthopnoea    "just since open heart surgery" (02/25/2013)  . Pacemaker    [employer]   . Pneumonia ~ 2012   "walking pneumonia"  . Pneumonia 07/23/2017  . Stroke [facility]) 2001   denies residual on 02/25/2013  . Symptomatic hypotension 10/10/2016  . Syncope and collapse    "prior to open heart surgery" (02/25/2013)  . Type II diabetes mellitus ([place])    Past Surgical History:  Procedure Laterality Date  . BREAST SURGERY Right    "clamped blood vessel and stitched it up; in [place]"  . CARDIAC CATHETERIZATION    . CARDIAC VALVE REPLACEMENT  01/26/2013   "cow valve put in; widened the second valve" (02/25/2013) - [place], [place]  . CATARACT EXTRACTION W/ INTRAOCULAR LENS  IMPLANT, BILATERAL Bilateral 04/2017  . CHOLECYSTECTOMY OPEN  1971  . CYST EXCISION     "taken off my  chest"  . INSERT / REPLACE / REMOVE PACEMAKER  01/19/2013   [employer] - dual chamber  . [place] RESECTION  2009   in [place]  . VEIN SURGERY Left    LLE; cut leg open and took out piece of vein that was protruding thru skin; stitched it up; done in [place]    Current Outpatient Medications  Medication Sig Dispense Refill  . albuterol (PROVENTIL HFA;VENTOLIN HFA) 108 (90 BASE) MCG/ACT inhaler Inhale 2 puffs into the lungs. Use as directed -  With exercise    . alendronate (FOSAMAX) 70 MG tablet Take 70 mg by mouth every 7 (seven) days. SUNDAYS    . apixaban (ELIQUIS) 5 MG TABS tablet Take 1 tablet (5 mg total) by mouth 2 (two) times daily. 60 tablet 1  . atorvastatin (LIPITOR) 40 MG tablet Take 40 mg by mouth daily.    . carvedilol (COREG) 12.5 MG tablet Take 1 tablet (12.5 mg total) 2 (two) times daily by mouth. 180 tablet 3  . cloNIDine (CATAPRES) 0.2 MG tablet Take 1 tablet (0.2 mg total) by mouth at bedtime. 30 tablet 11  . Ferrous Sulfate (IRON) 325 (65 Fe) MG TABS Take 65 mg by mouth daily.  0  . FLUoxetine (PROZAC) 40 MG capsule Take 40 mg by mouth daily.     . furosemide (LASIX) 40 MG  tablet Take one tablet Monday, Wednesday and Friday (Patient taking differently: 40 mg daily. ) 15 tablet 5  . gabapentin (NEURONTIN) 100 MG capsule TAKE 3 CAPSULES BY MOUTH EVERY DAY AT BEDTIME    . glipiZIDE (GLUCOTROL) 10 MG tablet Take 10 mg by mouth 2 (two) times daily before a meal.    . guaiFENesin (MUCINEX) 600 MG 12 hr tablet Take 600 mg by mouth daily.    . hydrocortisone 2.5 % cream Apply 1 application topically daily.    Marland Kitchen levothyroxine (SYNTHROID, LEVOTHROID) 50 MCG tablet Take 50 mcg by mouth daily before breakfast.    . metFORMIN (GLUCOPHAGE) 500 MG tablet Take 500 mg by mouth daily with breakfast.     . montelukast (SINGULAIR) 10 MG tablet Take 10 mg by mouth at bedtime.    Marland Kitchen olmesartan (BENICAR) 20 MG tablet Take 1 tablet (20 mg total) by mouth daily. 90 tablet  3  . Prenatal Vit-Fe Fumarate-FA (PREPLUS) 27-1 MG TABS Take 1 tablet by mouth daily.  3  . SYMBICORT 160-4.5 MCG/ACT inhaler INHALE 2 PUFFS BY MOUTH TWICE DAILY IN THE MORNING AND IN THE EVENING    . temazepam (RESTORIL) 30 MG capsule Take 30 mg by mouth at bedtime.     . traMADol (ULTRAM) 50 MG tablet Take 50 mg by mouth as needed for moderate pain.      No current facility-administered medications for this encounter.    No Known Allergies  Social History   Socioeconomic History  . Marital status: Widowed    Spouse name: Not on file  . Number of children: 2  . Years of education: dental sch  . Highest education level: Not on file  Occupational History  . Occupation: retired  Tobacco Use  . Smoking status: Former Smoker    Packs/day: 2.00    Years: 40.00    Pack years: 80.00    Types: Cigarettes    Quit date: 05/29/1999    Years since quitting: 20.2  . Smokeless tobacco: Never Used  Substance and Sexual Activity  . Alcohol use: No  . Drug use: No  . Sexual activity: Yes  Other Topics Concern  . Not on file  Social History Narrative  . Not on file   Social Determinants of Health   Financial Resource Strain:   . Difficulty of Paying Living Expenses:   Food Insecurity:   . Worried About Charity fundraiser in the Last Year:   . Arboriculturist in the Last Year:   Transportation Needs:   . Film/video editor (Medical):   Marland Kitchen Lack of Transportation (Non-Medical):   Physical Activity:   . Days of Exercise per Week:   . Minutes of Exercise per Session:   Stress:   . Feeling of Stress :   Social Connections:   . Frequency of Communication with Friends and Family:   . Frequency of Social Gatherings with Friends and Family:   . Attends Religious Services:   . Active Member of Clubs or Organizations:   . Attends Archivist Meetings:   Marland Kitchen Marital Status:   Intimate Partner Violence:   . Fear of Current or Ex-Partner:   . Emotionally Abused:   Marland Kitchen  Physically Abused:   . Sexually Abused:     Family History  Problem Relation Age of Onset  . Lung cancer Mother   . Heart Problems Father   . Hypertension Brother   . Colon polyps Brother   . Skin  cancer Brother   . Colon polyps Daughter   . Diabetes Maternal Aunt   . Breast cancer Maternal Aunt        mets to liver  . Diabetes Paternal Aunt     ROS- All systems are reviewed and negative except as per the HPI above  Physical Exam: There were no vitals filed for this visit. Wt Readings from Last 3 Encounters:  07/22/19 73.5 kg  03/30/19 78.9 kg  02/21/19 78.5 kg    Labs: Lab Results  Component Value Date   NA 144 03/24/2018   K 4.6 03/24/2018   CL 103 03/24/2018   CO2 24 03/24/2018   GLUCOSE 162 (H) 03/24/2018   BUN 29 (H) 03/24/2018   CREATININE 1.28 (H) 03/24/2018   CALCIUM 9.1 03/24/2018   MG 1.4 (L) 08/19/2017   No results found for: INR No results found for: CHOL, HDL, LDLCALC, TRIG   GEN- The patient is well appearing, alert and oriented x 3 today.   Head- normocephalic, atraumatic Eyes-  Sclera clear, conjunctiva pink Ears- hearing intact Oropharynx- clear Neck- supple, no JVP Lymph- no cervical lymphadenopathy Lungs- Clear to ausculation bilaterally, normal work of breathing Heart- Regular rate and rhythm, no murmurs, rubs or gallops, PMI not laterally displaced GI- soft, NT, ND, + BS Extremities- no clubbing, cyanosis, or edema MS- no significant deformity or atrophy Skin- no rash or lesion Psych- euthymic mood, full affect Neuro- strength and sensation are intact  EKG-none done today Device interrogated by Dr. Loletha Grayer and  showed successful over pacing of atrial flutter to SR.    Assessment and Plan: 1. A flutter Successfully over paced to SR Possibly associated with recent  URI  2. CHA2DS2VASc score of 6 Continue eliquis 5 mg bid  F/u with Dr. Debara Pickett as scheduled  Geroge Baseman. Tannie Koskela, [facility] [address] [place], [place] [address] [phone]

## 2019-08-14 NOTE — Progress Notes (Signed)
Comprehensive pacemaker check in the AFib clinic by me. Normal device function (dual chamber [employer]SC [employer] pacemaker) with normal lead parameters and approximately 2 years anticipated generator longevity. Approximately 58% A pacing (previously higher at 95%, now in A flutter for >4 weeks) and 52% V pacing (increased V pacing during A flutter, previous baseline approximately 40%). In uninterrupted atrial flutter since beginning of March, well rate controlled. On anticoagulants for > 4 weeks. No missed doses. Overdrive atrial pacing was performed. Baseline atrial flutter CL=230 ms. Burst atrial OD pacing failed at 220, 210, 200, 190, 180 ms, but was successful at 170 ms, converting to Atrial paced, Ventricular sensed rhythm. No complications during the procedure. Will plan repeat interrogation of the device at her scheduled appointment with Dr. Debara Pickett 09/25/2019, 10AM (I will be in clinic to check).  Sanda Klein, MD, [facility] [phone] office [phone] pager

## 2019-08-18 ENCOUNTER — Other Ambulatory Visit (HOSPITAL_COMMUNITY): Payer: Medicare Other

## 2019-08-18 ENCOUNTER — Other Ambulatory Visit: Payer: Self-pay | Admitting: Cardiovascular Disease

## 2019-09-14 ENCOUNTER — Other Ambulatory Visit: Payer: Self-pay | Admitting: Cardiovascular Disease

## 2019-09-14 ENCOUNTER — Other Ambulatory Visit: Payer: Self-pay

## 2019-09-14 ENCOUNTER — Emergency Department (HOSPITAL_COMMUNITY)
Admission: EM | Admit: 2019-09-14 | Discharge: 2019-09-14 | Disposition: A | Discharge status: Home / Self Care | Payer: Medicare Other | Attending: Emergency Medicine | Admitting: Emergency Medicine

## 2019-09-14 ENCOUNTER — Telehealth: Payer: Self-pay | Admitting: Cardiovascular Disease

## 2019-09-14 DIAGNOSIS — Z7901 Long term (current) use of anticoagulants: Secondary | ICD-10-CM | POA: Insufficient documentation

## 2019-09-14 DIAGNOSIS — I2510 Atherosclerotic heart disease of native coronary artery without angina pectoris: Secondary | ICD-10-CM | POA: Diagnosis not present

## 2019-09-14 DIAGNOSIS — R040 Epistaxis: Secondary | ICD-10-CM

## 2019-09-14 DIAGNOSIS — I509 Heart failure, unspecified: Secondary | ICD-10-CM | POA: Diagnosis not present

## 2019-09-14 DIAGNOSIS — Z87891 Personal history of nicotine dependence: Secondary | ICD-10-CM | POA: Diagnosis not present

## 2019-09-14 DIAGNOSIS — E039 Hypothyroidism, unspecified: Secondary | ICD-10-CM | POA: Diagnosis not present

## 2019-09-14 DIAGNOSIS — Z950 Presence of cardiac pacemaker: Secondary | ICD-10-CM | POA: Insufficient documentation

## 2019-09-14 DIAGNOSIS — Z7984 Long term (current) use of oral hypoglycemic drugs: Secondary | ICD-10-CM | POA: Diagnosis not present

## 2019-09-14 DIAGNOSIS — Z952 Presence of prosthetic heart valve: Secondary | ICD-10-CM | POA: Insufficient documentation

## 2019-09-14 DIAGNOSIS — I110 Hypertensive heart disease with heart failure: Secondary | ICD-10-CM | POA: Insufficient documentation

## 2019-09-14 DIAGNOSIS — Z79899 Other long term (current) drug therapy: Secondary | ICD-10-CM | POA: Insufficient documentation

## 2019-09-14 DIAGNOSIS — E119 Type 2 diabetes mellitus without complications: Secondary | ICD-10-CM | POA: Insufficient documentation

## 2019-09-14 MED ORDER — LIDOCAINE-EPINEPHRINE-TETRACAINE (LET) TOPICAL GEL
3.0000 mL | Freq: Once | TOPICAL | Status: AC
  Administered 2019-09-14: 3 mL via TOPICAL
  Filled 2019-09-14: qty 3

## 2019-09-14 MED ORDER — APIXABAN 5 MG PO TABS: 5.0000 mg | ORAL_TABLET | Freq: Two times a day (BID) | ORAL | 5 refills | Status: DC

## 2019-09-14 NOTE — ED Provider Notes (Signed)
[facility] DEPT Provider Note   CSN: [id number] Arrival date & time: 09/14/19  1931     History Chief Complaint  Patient presents with  . Epistaxis    Tonya Glass is a 74 y.o. female.  HPI   74 year old female with epistaxis.  Onset this afternoon.  Went to urgent care and referred to the emergency room.  Prior nosebleed many years ago but none recently.  She is on Eliquis for history of A. fib.  She did feel like some blood was dripping to the back of her throat initially with her chest since improved.  Denies any overt bleeding elsewhere.  Past Medical History:  Diagnosis Date  . Anxiety   . Aortic valve stenosis 10/10/2016  . Arthritis    "fingers" (07/24/2017)  . CHF (congestive heart failure) ([place])   . COPD (chronic obstructive pulmonary disease) ([place])   . Depression   . Gallstones   . Heart murmur   . High cholesterol   . History of kidney stones   . Hypertension   . Hypothyroidism   . Migraines    "none in years til 01/2013-02/2013; none since"  (07/24/2017)  . Orthopnoea    "just since open heart surgery" (02/25/2013)  . Pacemaker    [employer]   . Pneumonia ~ 2012   "walking pneumonia"  . Pneumonia 07/23/2017  . Stroke [facility]) 2001   denies residual on 02/25/2013  . Symptomatic hypotension 10/10/2016  . Syncope and collapse    "prior to open heart surgery" (02/25/2013)  . Type II diabetes mellitus [facility])     Patient Active Problem List   Diagnosis Date Noted  . Thoracic aortic aneurysm without rupture ([place]) 03/19/2018  . Nonrheumatic aortic (valve) insufficiency 03/19/2018  . Acute CHF (congestive heart failure) ([place]) 08/18/2017  . Acute diastolic CHF (congestive heart failure) ([place]) 08/18/2017  . CHF (congestive heart failure) ([place]) 08/18/2017  . Hypothyroidism 07/23/2017  . Acute respiratory failure with hypoxia ([place]) 07/23/2017  . Pneumonia 07/23/2017  . Aortic valve stenosis 10/10/2016  . Diastolic murmur  [id number]  . SSS (sick sinus syndrome) ([place]) 03/20/2016  . 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  . CAD (coronary artery disease) 09/06/2014  . Pacemaker 05/28/2013  . Essential hypertension 05/28/2013  . Dyslipidemia 05/28/2013  . S/P aortic valve replacement with bioprosthetic valve 05/28/2013  . DM2 (diabetes mellitus, type 2) ([place]) 05/28/2013  . Chronic heart failure ([place]) 05/28/2013    Past Surgical History:  Procedure Laterality Date  . BREAST SURGERY Right    "clamped blood vessel and stitched it up; in [place]"  . CARDIAC CATHETERIZATION    . CARDIAC VALVE REPLACEMENT  01/26/2013   "cow valve put in; widened the second valve" (02/25/2013) - [place], [place]  . CATARACT EXTRACTION W/ INTRAOCULAR LENS  IMPLANT, BILATERAL Bilateral 04/2017  . CHOLECYSTECTOMY OPEN  1971  . CYST EXCISION     "taken off my chest"  . INSERT / REPLACE / REMOVE PACEMAKER  01/19/2013   [employer] - dual chamber  . [place] RESECTION  2009   in [place]  . VEIN SURGERY Left    LLE; cut leg open and took out piece of vein that was protruding thru skin; stitched it up; done in [place]     OB History   No obstetric history on file.     Family History  Problem Relation Age of Onset  . Lung cancer Mother   . Heart Problems Father   .  Hypertension Brother   . Colon polyps Brother   . Skin cancer Brother   . Colon polyps Daughter   . Diabetes Maternal Aunt   . Breast cancer Maternal Aunt        mets to liver  . Diabetes Paternal Aunt     Social History   Tobacco Use  . Smoking status: Former Smoker    Packs/day: 2.00    Years: 40.00    Pack years: 80.00    Types: Cigarettes    Quit date: 05/29/1999    Years since quitting: 20.3  . Smokeless tobacco: Never Used  Substance Use Topics  . Alcohol use: No  . Drug use: No    Home Medications Prior to Admission medications   Medication Sig Start Date End Date Taking? Authorizing Provider  albuterol (PROVENTIL  HFA;VENTOLIN HFA) 108 (90 BASE) MCG/ACT inhaler Inhale 2 puffs into the lungs every 6 (six) hours as needed for wheezing or shortness of breath. Use as directed -  With exercise 12/08/13  Yes [provider]  alendronate (FOSAMAX) 70 MG tablet Take 70 mg by mouth every 7 (seven) days. SUNDAYS 07/13/17  Yes [provider]  apixaban (ELIQUIS) 5 MG TABS tablet Take 1 tablet (5 mg total) by mouth 2 (two) times daily. 09/14/19  Yes Croitoru, Mihai, MD  atorvastatin (LIPITOR) 40 MG tablet Take 40 mg by mouth daily.   Yes [provider]  carvedilol (COREG) 12.5 MG tablet Take 1 tablet (12.5 mg total) 2 (two) times daily by mouth. 03/22/17  Yes Croitoru, Mihai, MD  cloNIDine (CATAPRES) 0.2 MG tablet Take 1 tablet (0.2 mg total) by mouth at bedtime. 09/18/17  Yes Hilty, Nadean Corwin, MD  ergocalciferol (VITAMIN D2) 1.25 MG (50000 UT) capsule Take 50,000 Units by mouth once a week.   Yes [provider]  Ferrous Sulfate (IRON) 325 (65 Fe) MG TABS Take 65 mg by mouth daily. 08/13/17  Yes [provider]  FLUoxetine (PROZAC) 40 MG capsule Take 40 mg by mouth daily.    Yes [provider]  fluticasone (FLONASE) 50 MCG/ACT nasal spray Place 1 spray into both nostrils daily as needed for allergies.  08/14/19  Yes [provider]  Fluticasone-Umeclidin-Vilant (TRELEGY ELLIPTA) 100-62.5-25 MCG/INH AEPB Inhale 1 puff into the lungs daily.   Yes [provider]  furosemide (LASIX) 40 MG tablet Take one tablet Monday, Wednesday and Friday Patient taking differently: Take 40 mg by mouth daily.  04/06/19  Yes Croitoru, Mihai, MD  gabapentin (NEURONTIN) 300 MG capsule Take 300 mg by mouth daily.   Yes [provider]  glipiZIDE (GLUCOTROL) 10 MG tablet Take 10 mg by mouth 2 (two) times daily before a meal.   Yes [provider]  guaiFENesin (MUCINEX) 600 MG 12 hr tablet Take 600 mg by mouth 2 (two) times daily.   Yes [provider]   HYDROcodone-acetaminophen (NORCO/VICODIN) 5-325 MG tablet Take 1 tablet by mouth every 6 (six) hours as needed for moderate pain or severe pain.   Yes [provider]  hydrocortisone 2.5 % cream Apply 1 application topically daily as needed (dry skin).  07/15/17  Yes [provider]  levothyroxine (SYNTHROID, LEVOTHROID) 50 MCG tablet Take 50 mcg by mouth daily before breakfast.   Yes [provider]  magnesium oxide (MAG-OX) 400 MG tablet Take 400 mg by mouth 2 (two) times daily.   Yes [provider]  metFORMIN (GLUCOPHAGE) 1000 MG tablet Take 500 mg by mouth every  evening.  08/25/19  Yes [provider]  methocarbamol (ROBAXIN) 500 MG tablet Take 1,000 mg by mouth 2 (two) times daily as needed for muscle spasms.   Yes [provider]  montelukast (SINGULAIR) 10 MG tablet Take 10 mg by mouth at bedtime.   Yes [provider]  nystatin ointment (MYCOSTATIN) Apply 1 application topically daily as needed (rash). 100,000- units/gram    Yes [provider]  olmesartan (BENICAR) 20 MG tablet Take 1 tablet (20 mg total) by mouth daily. 03/30/19  Yes Croitoru, Mihai, MD  Prenatal Vit-Fe Fumarate-FA (PREPLUS) 27-1 MG TABS Take 1 tablet by mouth daily. 08/14/17  Yes [provider]  SYMBICORT 160-4.5 MCG/ACT inhaler Inhale 2 puffs into the lungs in the morning and at bedtime.  09/12/18  Yes [provider]  temazepam (RESTORIL) 30 MG capsule Take 30 mg by mouth at bedtime.  07/07/17  Yes [provider]  traMADol (ULTRAM) 50 MG tablet Take 50 mg by mouth every 6 (six) hours as needed for moderate pain.  07/23/13  Yes [provider]  triamcinolone ointment (KENALOG) 0.1 % Apply 1 application topically 3 (three) times daily.  04/21/19  Yes [provider]    Allergies    Patient has no known allergies.  Review of Systems   Review of Systems All systems reviewed and negative, other than as noted in  HPI.  Physical Exam Updated Vital Signs BP (!) 200/62 (BP Location: Left Arm)   Pulse 70   Temp 98.5 F (36.9 C) (Oral)   Resp 16   SpO2 92%   Physical Exam Vitals and nursing note reviewed.  Constitutional:      General: She is not in acute distress.    Appearance: She is well-developed.  HENT:     Head: Normocephalic and atraumatic.     Nose:     Comments: Epistaxis. Clot R nares. Old appearing blood in oropharynx. No active bleeding noted. Clot evacuated. Anterior bleed from septum.  Eyes:     General:        Right eye: No discharge.        Left eye: No discharge.     Conjunctiva/sclera: Conjunctivae normal.  Cardiovascular:     Rate and Rhythm: Normal rate and regular rhythm.     Heart sounds: Normal heart sounds. No murmur. No friction rub. No gallop.   Pulmonary:     Effort: Pulmonary effort is normal. No respiratory distress.     Breath sounds: Normal breath sounds.  Abdominal:     General: There is no distension.     Palpations: Abdomen is soft.     Tenderness: There is no abdominal tenderness.  Musculoskeletal:        General: No tenderness.     Cervical back: Neck supple.  Skin:    General: Skin is warm and dry.  Neurological:     Mental Status: She is alert.  Psychiatric:        Behavior: Behavior normal.        Thought Content: Thought content normal.     ED Results / Procedures / Treatments   Labs (all labs ordered are listed, but only abnormal results are displayed) Labs Reviewed - No data to display  EKG None  Radiology No results found.  Procedures .Epistaxis Management  Date/Time: 09/14/2019 8:40 PM Performed by: Virgel Manifold, MD Authorized by: Virgel Manifold, MD   Consent:    Consent obtained:  Verbal   Consent given by:  Patient   Risks discussed:  Bleeding, nasal injury and pain Anesthesia (see MAR for exact dosages):    Anesthesia method:  Topical application   Topical anesthetic:  LET Procedure details:    Treatment site:   R septum   Treatment method:  Silver nitrate   Treatment complexity:  Extensive Post-procedure details:    Assessment:  Bleeding stopped   Patient tolerance of procedure:  Tolerated well, no immediate complications   (including critical care time)  Medications Ordered in ED Medications  lidocaine-EPINEPHrine-tetracaine (LET) topical gel (has no administration in time range)    ED Course  I have reviewed the triage vital signs and the nursing notes.  Pertinent labs & imaging results that were available during my care of the patient were reviewed by me and considered in my medical decision making (see chart for details).    MDM Rules/Calculators/A&P                      Anterior epistaxis. Slowed considerably after let. Resolved with silver nitrate cautery. Continued care discussed. ENT FU as needed.   Final Clinical Impression(s) / ED Diagnoses Final diagnoses:  Acute anterior epistaxis    Rx / DC Orders ED Discharge Orders    None       Virgel Manifold, MD 09/15/19 2142

## 2019-09-14 NOTE — Telephone Encounter (Signed)
*  STAT* If patient is at the pharmacy, call can be transferred to refill team.   1. Which medications need to be refilled? (please list name of each medication and dose if known)  apixaban (ELIQUIS) 5 MG TABS tablet  2. Which pharmacy/location (including street and city if local pharmacy) is medication to be sent to?  [place] [facility], [address]  3. Do they need a 30 day or 90 day supply? 90  Pt is out of medication

## 2019-09-14 NOTE — ED Triage Notes (Signed)
PER EMS: Pt is coming from home with c/o nose bleed. Pt has been bleeding from her right nostril for the past 3 hours. Urgent care told her to come here. Bleeding uncontrolled, currently packed with gauze. EMS administered 2 sprays of afrin. States she is currently on eliquis for afib. Hx HTN and diabetes.  EMS VITALS: BP 180/84 HR 74 SPO2 99% RA CBG 331

## 2019-09-14 NOTE — ED Notes (Signed)
LET solution at bedside.

## 2019-09-14 NOTE — Telephone Encounter (Signed)
74yo Female Scr = 1.0 per care everywhere (08/05/2019) Dx A.Flutter Wt 73.4 kg

## 2019-09-25 ENCOUNTER — Telehealth: Payer: Medicare Other | Admitting: Internal Medicine

## 2019-10-01 ENCOUNTER — Encounter: Payer: Self-pay | Admitting: Physician Assistant

## 2019-10-01 ENCOUNTER — Telehealth: Payer: Self-pay

## 2019-10-01 ENCOUNTER — Telehealth (INDEPENDENT_AMBULATORY_CARE_PROVIDER_SITE_OTHER): Payer: Medicare Other | Admitting: Physician Assistant

## 2019-10-01 VITALS — BP 148/81 | HR 74 | Temp 97.8°F | Ht 61.0 in | Wt 171.0 lb

## 2019-10-01 DIAGNOSIS — I10 Essential (primary) hypertension: Secondary | ICD-10-CM

## 2019-10-01 DIAGNOSIS — Z953 Presence of xenogenic heart valve: Secondary | ICD-10-CM | POA: Diagnosis not present

## 2019-10-01 DIAGNOSIS — Z9889 Other specified postprocedural states: Secondary | ICD-10-CM

## 2019-10-01 DIAGNOSIS — J449 Chronic obstructive pulmonary disease, unspecified: Secondary | ICD-10-CM

## 2019-10-01 DIAGNOSIS — Z950 Presence of cardiac pacemaker: Secondary | ICD-10-CM | POA: Diagnosis not present

## 2019-10-01 DIAGNOSIS — I2510 Atherosclerotic heart disease of native coronary artery without angina pectoris: Secondary | ICD-10-CM

## 2019-10-01 DIAGNOSIS — I4892 Unspecified atrial flutter: Secondary | ICD-10-CM

## 2019-10-01 DIAGNOSIS — E785 Hyperlipidemia, unspecified: Secondary | ICD-10-CM

## 2019-10-01 DIAGNOSIS — E119 Type 2 diabetes mellitus without complications: Secondary | ICD-10-CM

## 2019-10-01 NOTE — Progress Notes (Signed)
Virtual Visit via Telephone Note   This visit type was conducted due to national recommendations for restrictions regarding the COVID-19 Pandemic (e.g. social distancing) in an effort to limit this patient's exposure and mitigate transmission in our community.  Due to her co-morbid illnesses, this patient is at least at moderate risk for complications without adequate follow up.  This format is felt to be most appropriate for this patient at this time.  The patient did not have access to video technology/had technical difficulties with video requiring transitioning to audio format only (telephone).  All issues noted in this document were discussed and addressed.  No physical exam could be performed with this format.  Please refer to the patient's chart for her  consent to telehealth for [facility].   The patient was identified using 2 identifiers.  Date:  10/03/2019   ID:  [name], DOB January 27, 1946, MRN [id number]  Patient Location: Home Provider Location: Home  PCP:  Sonia Side., FNP  Cardiologist:  Pixie Casino, MD  Electrophysiologist:  Dr. Sallyanne Kuster  Evaluation Performed:  Follow-Up Visit  Chief Complaint:  followup  History of Present Illness:    Anaissa Mcfield is a 74 y.o. 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  Echocardiogram performed on 05/27/2019 showed EF 55 to 60%, grade 1 DD bioprosthetic aortic valve present with mean gradient 19 mmHg, peak gradient 31.1 mmHg, mild paravalvular aortic insufficiency.  Eliquis was started in March 2021 after her device detected 4:1 atrial flutter.  She underwent successful overdrive pacing on [id number] by Dr. Sallyanne Kuster.  The plan was to repeat device interrogation on the next  follow-up.  She was seen in the ED on 09/14/2019 with anterior epistaxis, this was treated with silver nitrate cautery.  Patient presented for virtual visit today.  She is doing well and has not had any palpitations since overdrive pacing in April.  She did receive both shot Moderna vaccine.  She is aware of upcoming remote transmission due again early June.  Overall she is doing well at home and appears to be quite independent.  She keeps herself busy with different activities.  She is stable from cardiac perspective and can follow-up in 1 year with Dr. Debara Pickett.  During the meantime, she will follow-up with Dr. Sallyanne Kuster in 6 months.  The patient does not have symptoms concerning for COVID-19 infection (fever, chills, cough, or new shortness of breath).    Past Medical History:  Diagnosis Date  . Anxiety   . Aortic valve stenosis 10/10/2016  . Arthritis    "fingers" (07/24/2017)  . CHF (congestive heart failure) ([place])   . COPD (chronic obstructive pulmonary disease) ([place])   . Depression   . Gallstones   . Heart murmur   . High cholesterol   . History of kidney stones   . Hypertension   . Hypothyroidism   . Migraines    "none in years til 01/2013-02/2013; none since"  (07/24/2017)  . Orthopnoea    "just since open heart surgery" (02/25/2013)  . Pacemaker    [employer]   . Pneumonia ~ 2012   "walking pneumonia"  . Pneumonia 07/23/2017  . Stroke [facility]) 2001   denies residual on 02/25/2013  . Symptomatic hypotension 10/10/2016  . Syncope and  collapse    "prior to open heart surgery" (02/25/2013)  . Type II diabetes mellitus ([place])    Past Surgical History:  Procedure Laterality Date  . BREAST SURGERY Right    "clamped blood vessel and stitched it up; in [place]"  . CARDIAC CATHETERIZATION    . CARDIAC VALVE REPLACEMENT  01/26/2013   "cow valve put in; widened the second valve" (02/25/2013) - [place], [place]  . CATARACT EXTRACTION W/ INTRAOCULAR LENS  IMPLANT, BILATERAL Bilateral  04/2017  . CHOLECYSTECTOMY OPEN  1971  . CYST EXCISION     "taken off my chest"  . INSERT / REPLACE / REMOVE PACEMAKER  01/19/2013   [employer] - dual chamber  . [facility] RESECTION  2009   in [place]  . VEIN SURGERY Left    LLE; cut leg open and took out piece of vein that was protruding thru skin; stitched it up; done in [place]     Current Meds  Medication Sig  . albuterol (PROVENTIL HFA;VENTOLIN HFA) 108 (90 BASE) MCG/ACT inhaler Inhale 2 puffs into the lungs every 6 (six) hours as needed for wheezing or shortness of breath. Use as directed -  With exercise  . alendronate (FOSAMAX) 70 MG tablet Take 70 mg by mouth every 7 (seven) days. SUNDAYS  . apixaban (ELIQUIS) 5 MG TABS tablet Take 1 tablet (5 mg total) by mouth 2 (two) times daily.  Marland Kitchen atorvastatin (LIPITOR) 40 MG tablet Take 40 mg by mouth daily.  . carvedilol (COREG) 12.5 MG tablet Take 1 tablet (12.5 mg total) 2 (two) times daily by mouth.  . cloNIDine (CATAPRES) 0.2 MG tablet Take 1 tablet (0.2 mg total) by mouth at bedtime.  . ergocalciferol (VITAMIN D2) 1.25 MG (50000 UT) capsule Take 50,000 Units by mouth once a week.  . Ferrous Sulfate (IRON) 325 (65 Fe) MG TABS Take 65 mg by mouth daily.  Marland Kitchen FLUoxetine (PROZAC) 40 MG capsule Take 40 mg by mouth daily.   . fluticasone (FLONASE) 50 MCG/ACT nasal spray Place 1 spray into both nostrils daily as needed for allergies.   . Fluticasone-Umeclidin-Vilant (TRELEGY ELLIPTA) 100-62.5-25 MCG/INH AEPB Inhale 1 puff into the lungs daily.  . furosemide (LASIX) 40 MG tablet Take one tablet Monday, Wednesday and Friday (Patient taking differently: Take 40 mg by mouth daily. Take 1 tablet daily)  . gabapentin (NEURONTIN) 300 MG capsule Take 300 mg by mouth daily.  Marland Kitchen glipiZIDE (GLUCOTROL) 10 MG tablet Take 10 mg by mouth 2 (two) times daily before a meal.  . glucose blood test strip 1 each by Other route. Use as instructed  . guaiFENesin (MUCINEX) 600 MG 12 hr tablet  Take 600 mg by mouth 2 (two) times daily.  . hydrocortisone 2.5 % cream Apply 1 application topically daily as needed (dry skin).   Marland Kitchen levothyroxine (SYNTHROID, LEVOTHROID) 50 MCG tablet Take 50 mcg by mouth daily before breakfast.  . magnesium oxide (MAG-OX) 400 MG tablet Take 400 mg by mouth 2 (two) times daily.  . metFORMIN (GLUCOPHAGE) 1000 MG tablet Take 500 mg by mouth every evening.   . montelukast (SINGULAIR) 10 MG tablet Take 10 mg by mouth at bedtime.  Marland Kitchen nystatin ointment (MYCOSTATIN) Apply 1 application topically daily as needed (rash). 100,000- units/gram   . olmesartan (BENICAR) 20 MG tablet Take 1 tablet (20 mg total) by mouth daily.  . Prenatal Vit-Fe Fumarate-FA (PREPLUS) 27-1 MG TABS Take 1 tablet by mouth daily.  . temazepam (RESTORIL) 30 MG capsule Take  30 mg by mouth at bedtime.   . traMADol (ULTRAM) 50 MG tablet Take 50 mg by mouth every 6 (six) hours as needed for moderate pain.   Marland Kitchen triamcinolone ointment (KENALOG) 0.1 % Apply 1 application topically 3 (three) times daily.      Allergies:   Patient has no known allergies.   Social History   Tobacco Use  . Smoking status: Former Smoker    Packs/day: 2.00    Years: 40.00    Pack years: 80.00    Types: Cigarettes    Quit date: 05/29/1999    Years since quitting: 20.3  . Smokeless tobacco: Never Used  Substance Use Topics  . Alcohol use: No  . Drug use: No     Family Hx: The patient's family history includes Breast cancer in her maternal aunt; Colon polyps in her brother and daughter; Diabetes in her maternal aunt and paternal aunt; Heart Problems in her father; Hypertension in her brother; Lung cancer in her mother; Skin cancer in her brother.  ROS:   Please see the history of present illness.     All other systems reviewed and are negative.   Prior CV studies:   The following studies were reviewed today:  Echo 05/27/2019 1. Left ventricular ejection fraction, by visual estimation, is 55 to  60%. The left  ventricle has normal function. There is no left ventricular  hypertrophy.  2. Elevated left ventricular end-diastolic pressure.  3. Left ventricular diastolic parameters are consistent with Grade I  diastolic dysfunction (impaired relaxation).  4. The left ventricle has no regional wall motion abnormalities.  5. Global right ventricle has normal systolic function.The right  ventricular size is normal. No increase in right ventricular wall  thickness.  6. Left atrial size was mildly dilated.  7. Right atrial size was normal.  8. The mitral valve is normal in structure. Trivial mitral valve  regurgitation. No evidence of mitral stenosis.  9. The tricuspid valve is normal in structure.  10. The aortic valve bioprosthesis was not well visualized. 34mm Perimount  magna ease bioprosthetic aortic valve valve is present in the aortic  position. Aortic valve mean gradient measures 19.0 mmHg. Aortic valve peak  gradient measures 31.1 mmHg. Aortic  valve area, by VTI measures 0.81 cm. There is mild perivalvular aortic  insufficiency.  11. The pulmonic valve was normal in structure. Pulmonic valve  regurgitation is not visualized.  12. Mildly elevated pulmonary artery systolic pressure.  13. The inferior vena cava is normal in size with greater than 50%  respiratory variability, suggesting right atrial pressure of 3 mmHg.  14. Compared to study of 2019, the prosthetic AV mean gradient has  improved from 25 to 55mmHg. There remains mild perivalvular AI. PASP has  now normalized compared to 56mmHg in 2019.  Labs/Other Tests and Data Reviewed:    EKG:  An ECG dated 03/30/2019 was personally reviewed today and demonstrated:  AV paced rhythm  Recent Labs: No results found for requested labs within last 8760 hours.   Recent Lipid Panel No results found for: CHOL, TRIG, HDL, CHOLHDL, LDLCALC, LDLDIRECT  Wt Readings from Last 3 Encounters:  10/01/19 171 lb (77.6 kg)  08/14/19 161 lb  12.8 oz (73.4 kg)  07/22/19 162 lb (73.5 kg)     Objective:    Vital Signs:  BP (!) 148/81   Pulse 74   Temp 97.8 F (36.6 C)   Ht 5\' 1"  (1.549 m)   Wt 171 lb (77.6 kg)  BMI 32.31 kg/m    VITAL SIGNS:  reviewed  ASSESSMENT & PLAN:    1. Atrial flutter: Recently underwent overdrive pacing in April.  She has not had any recurrent palpitation since.  She is due for remote device transmission next month.  Continue Eliquis  2. History of aortic root and aortic valve replacement: Stable on last echocardiogram  3. History of pacemaker: Pending upcoming remote device transmission next month  4. CAD: Previous cardiac catheterization in 2014 show mild disease  5. COPD: No acute exacerbation  6. Hypertension: Blood pressure mildly elevated today, however it was normal previously  7. Hyperlipidemia: On Lipitor  8. DM2: Managed by primary care provider   COVID-19 Education: The signs and symptoms of COVID-19 were discussed with the patient and how to seek care for testing (follow up with PCP or arrange E-visit).  The importance of social distancing was discussed today.  Time:   Today, I have spent 9 minutes with the patient with telehealth technology discussing the above problems.     Medication Adjustments/Labs and Tests Ordered: Current medicines are reviewed at length with the patient today.  Concerns regarding medicines are outlined above.   Tests Ordered: No orders of the defined types were placed in this encounter.   Medication Changes: No orders of the defined types were placed in this encounter.   Follow Up:  Either In Person or Virtual in 6 month(s)  Signed, Almyra Deforest, [place]  10/03/2019 9:48 PM    [place]

## 2019-10-01 NOTE — Telephone Encounter (Signed)
  Patient Consent for Virtual Visit         Tonya Glass has provided verbal consent on 10/01/2019 for a virtual visit (video or telephone).   CONSENT FOR VIRTUAL VISIT FOR:  Tonya Glass  By participating in this virtual visit I agree to the following:  I hereby voluntarily request, consent and authorize [facility] and its employed or contracted physicians, physician assistants, nurse practitioners or other licensed health care professionals (the Practitioner), to provide me with telemedicine health care services (the "Services") as deemed necessary by the treating Practitioner. I acknowledge and consent to receive the Services by the Practitioner via telemedicine. I understand that the telemedicine visit will involve communicating with the Practitioner through live audiovisual communication technology and the disclosure of certain medical information by electronic transmission. I acknowledge that I have been given the opportunity to request an in-person assessment or other available alternative prior to the telemedicine visit and am voluntarily participating in the telemedicine visit.  I understand that I have the right to withhold or withdraw my consent to the use of telemedicine in the course of my care at any time, without affecting my right to future care or treatment, and that the Practitioner or I may terminate the telemedicine visit at any time. I understand that I have the right to inspect all information obtained and/or recorded in the course of the telemedicine visit and may receive copies of available information for a reasonable fee.  I understand that some of the potential risks of receiving the Services via telemedicine include:  Marland Kitchen Delay or interruption in medical evaluation due to technological equipment failure or disruption; . Information transmitted may not be sufficient (e.g. poor resolution of images) to allow for appropriate medical decision making by the Practitioner;  and/or  . In rare instances, security protocols could fail, causing a breach of personal health information.  Furthermore, I acknowledge that it is my responsibility to provide information about my medical history, conditions and care that is complete and accurate to the best of my ability. I acknowledge that Practitioner's advice, recommendations, and/or decision may be based on factors not within their control, such as incomplete or inaccurate data provided by me or distortions of diagnostic images or specimens that may result from electronic transmissions. I understand that the practice of medicine is not an exact science and that Practitioner makes no warranties or guarantees regarding treatment outcomes. I acknowledge that a copy of this consent can be made available to me via my patient portal ([place] [facility]), or I can request a printed copy by calling the office of [facility].    I understand that my insurance will be billed for this visit.   I have read or had this consent read to me. . I understand the contents of this consent, which adequately explains the benefits and risks of the Services being provided via telemedicine.  . I have been provided ample opportunity to ask questions regarding this consent and the Services and have had my questions answered to my satisfaction. . I give my informed consent for the services to be provided through the use of telemedicine in my medical care

## 2019-10-01 NOTE — Patient Instructions (Signed)
Medication Instructions:  Your physician recommends that you continue on your current medications as directed. Please refer to the Current Medication list given to you today.  *If you need a refill on your cardiac medications before your next appointment, please call your pharmacy*  Lab Work: NONE ordered at this time of appointment   If you have labs (blood work) drawn today and your tests are completely normal, you will receive your results only by: MyChart Message (if you have MyChart) OR A paper copy in the mail If you have any lab test that is abnormal or we need to change your treatment, we will call you to review the results.  Testing/Procedures: NONE ordered at this time of appointment   Follow-Up: At [facility], you and your health needs are our priority.  As part of our continuing mission to provide you with exceptional heart care, we have created designated Provider Care Teams.  These Care Teams include your primary Cardiologist (physician) and Advanced Practice Providers (APPs -  Physician Assistants and Nurse Practitioners) who all work together to provide you with the care you need, when you need it.  We recommend signing up for the patient portal called "MyChart".  Sign up information is provided on this After Visit Summary.  MyChart is used to connect with patients for Virtual Visits (Telemedicine).  Patients are able to view lab/test results, encounter notes, upcoming appointments, etc.  Non-urgent messages can be sent to your provider as well.   To learn more about what you can do with MyChart, go to [email or web address].    Your next appointment:   1 year(s)  The format for your next appointment:   In Person  Provider:   K. Chad Hilty, MD  Other Instructions   

## 2019-10-01 NOTE — Telephone Encounter (Signed)
Called patient to discuss AVS instructions gave Hao Meng's recommendations and patient voiced understanding. AVS summary mailed to patient.    

## 2019-10-14 ENCOUNTER — Ambulatory Visit (INDEPENDENT_AMBULATORY_CARE_PROVIDER_SITE_OTHER): Payer: Medicare Other | Admitting: *Deleted

## 2019-10-14 DIAGNOSIS — I495 Sick sinus syndrome: Secondary | ICD-10-CM

## 2019-10-14 LAB — CUP PACEART REMOTE DEVICE CHECK
Battery Remaining Longevity: 30 mo
Battery Remaining Percentage: 52 %
Brady Statistic RA Percent Paced: 63 %
Brady Statistic RV Percent Paced: 60 %
Date Time Interrogation Session: 20210531121600
Implantable Lead Implant Date: 20140908
Implantable Lead Implant Date: 20140908
Implantable Lead Location: 753859
Implantable Lead Location: 753860
Implantable Lead Model: 4135
Implantable Lead Model: 4136
Implantable Lead Serial Number: 29485718
Implantable Lead Serial Number: 29487623
Implantable Pulse Generator Implant Date: 20140908
Lead Channel Impedance Value: 527 Ohm
Lead Channel Impedance Value: 742 Ohm
Lead Channel Pacing Threshold Amplitude: 0.7 V
Lead Channel Pacing Threshold Pulse Width: 0.4 ms
Lead Channel Setting Pacing Amplitude: 2 V
Lead Channel Setting Pacing Amplitude: 2.5 V
Lead Channel Setting Pacing Pulse Width: 0.4 ms
Lead Channel Setting Sensing Sensitivity: 2.5 mV
Pulse Gen Serial Number: 160873

## 2019-10-17 NOTE — Progress Notes (Signed)
Thanks

## 2019-10-20 NOTE — Progress Notes (Signed)
Remote pacemaker transmission.   

## 2020-01-13 ENCOUNTER — Ambulatory Visit (INDEPENDENT_AMBULATORY_CARE_PROVIDER_SITE_OTHER): Payer: Medicare Other | Admitting: *Deleted

## 2020-01-13 DIAGNOSIS — I495 Sick sinus syndrome: Secondary | ICD-10-CM

## 2020-01-14 LAB — CUP PACEART REMOTE DEVICE CHECK
Battery Remaining Longevity: 30 mo
Battery Remaining Percentage: 48 %
Brady Statistic RA Percent Paced: 70 %
Brady Statistic RV Percent Paced: 68 %
Date Time Interrogation Session: 20210901044100
Implantable Lead Implant Date: 20140908
Implantable Lead Implant Date: 20140908
Implantable Lead Location: 753859
Implantable Lead Location: 753860
Implantable Lead Model: 4135
Implantable Lead Model: 4136
Implantable Lead Serial Number: 29485718
Implantable Lead Serial Number: 29487623
Implantable Pulse Generator Implant Date: 20140908
Lead Channel Impedance Value: 551 Ohm
Lead Channel Impedance Value: 752 Ohm
Lead Channel Pacing Threshold Amplitude: 0.7 V
Lead Channel Pacing Threshold Pulse Width: 0.4 ms
Lead Channel Setting Pacing Amplitude: 2 V
Lead Channel Setting Pacing Amplitude: 2.5 V
Lead Channel Setting Pacing Pulse Width: 0.4 ms
Lead Channel Setting Sensing Sensitivity: 2.5 mV
Pulse Gen Serial Number: 160873

## 2020-01-15 NOTE — Progress Notes (Signed)
Remote pacemaker transmission.   

## 2020-02-25 ENCOUNTER — Other Ambulatory Visit: Payer: Self-pay | Admitting: Cardiovascular Disease

## 2020-04-13 ENCOUNTER — Ambulatory Visit (INDEPENDENT_AMBULATORY_CARE_PROVIDER_SITE_OTHER): Payer: Medicare Other

## 2020-04-13 DIAGNOSIS — I495 Sick sinus syndrome: Secondary | ICD-10-CM | POA: Diagnosis not present

## 2020-04-14 LAB — CUP PACEART REMOTE DEVICE CHECK
Battery Remaining Longevity: 30 mo
Battery Remaining Percentage: 47 %
Brady Statistic RA Percent Paced: 72 %
Brady Statistic RV Percent Paced: 73 %
Date Time Interrogation Session: 20211201044100
Implantable Lead Implant Date: 20140908
Implantable Lead Implant Date: 20140908
Implantable Lead Location: 753859
Implantable Lead Location: 753860
Implantable Lead Model: 4135
Implantable Lead Model: 4136
Implantable Lead Serial Number: 29485718
Implantable Lead Serial Number: 29487623
Implantable Pulse Generator Implant Date: 20140908
Lead Channel Impedance Value: 550 Ohm
Lead Channel Impedance Value: 724 Ohm
Lead Channel Pacing Threshold Amplitude: 0.7 V
Lead Channel Pacing Threshold Pulse Width: 0.4 ms
Lead Channel Setting Pacing Amplitude: 2 V
Lead Channel Setting Pacing Amplitude: 2.5 V
Lead Channel Setting Pacing Pulse Width: 0.4 ms
Lead Channel Setting Sensing Sensitivity: 2.5 mV
Pulse Gen Serial Number: 160873

## 2020-04-20 NOTE — Progress Notes (Signed)
Remote pacemaker transmission.   

## 2020-06-03 ENCOUNTER — Telehealth: Payer: Self-pay | Admitting: Emergency Medicine

## 2020-06-03 NOTE — Telephone Encounter (Signed)
PAlert received that sowd patient has been in AFL with a controlled v-rate since 06/02/20. Patient reports she is asymptomatic. She is taking her Eliquis , and Coreg as documented. She takes her lasix 40 mg daily. Remote transmission sent today that showed AFL continues. She will send a transmission on Monday ,(06/06/20) when she returns from a trip to assess rhythm. Given ED precautions. Will forward to Dr Sallyanne Kuster .

## 2020-06-03 NOTE — Telephone Encounter (Signed)
Thanks, that's all appropriate advice. Will keep an eye out for the transmission.

## 2020-06-06 ENCOUNTER — Telehealth: Payer: Self-pay

## 2020-06-06 NOTE — Telephone Encounter (Signed)
I think she needs an office visit to discuss options for cardioversion. I'm in hospital all week, but maybe if AFib clinic has an opening? I can even pop down there to help w discussion.

## 2020-06-06 NOTE — Telephone Encounter (Signed)
Manual transmission received 06/06/20. Presenting appears AF/VP 70 bpm. AF burden 9%; v rates controlled. Routing to Dr. Sallyanne Kuster for review and recommendations.

## 2020-06-06 NOTE — Telephone Encounter (Signed)
Spoke to the patient. She was in a meeting and asked that we call back at 3:30

## 2020-06-06 NOTE — Telephone Encounter (Signed)
Spoke with the patient and her daughter. Appointment made for 10:30 at the atrial fib clinic. The parking deck code has been given to them.

## 2020-06-06 NOTE — Telephone Encounter (Signed)
Error

## 2020-06-10 ENCOUNTER — Ambulatory Visit (HOSPITAL_COMMUNITY)
Admission: RE | Admit: 2020-06-10 | Discharge: 2020-06-10 | Disposition: A | Discharge status: Home / Self Care | Payer: Medicare Other | Source: Ambulatory Visit | Attending: Nurse Practitioner | Admitting: Nurse Practitioner

## 2020-06-10 ENCOUNTER — Encounter (HOSPITAL_COMMUNITY): Payer: Self-pay | Admitting: Nurse Practitioner

## 2020-06-10 ENCOUNTER — Other Ambulatory Visit: Payer: Self-pay

## 2020-06-10 VITALS — BP 105/52 | HR 59 | Ht 61.0 in | Wt 189.0 lb

## 2020-06-10 DIAGNOSIS — I110 Hypertensive heart disease with heart failure: Secondary | ICD-10-CM | POA: Insufficient documentation

## 2020-06-10 DIAGNOSIS — I4892 Unspecified atrial flutter: Secondary | ICD-10-CM | POA: Diagnosis present

## 2020-06-10 DIAGNOSIS — I2510 Atherosclerotic heart disease of native coronary artery without angina pectoris: Secondary | ICD-10-CM | POA: Insufficient documentation

## 2020-06-10 DIAGNOSIS — E7800 Pure hypercholesterolemia, unspecified: Secondary | ICD-10-CM | POA: Insufficient documentation

## 2020-06-10 DIAGNOSIS — H1131 Conjunctival hemorrhage, right eye: Secondary | ICD-10-CM | POA: Diagnosis not present

## 2020-06-10 DIAGNOSIS — I495 Sick sinus syndrome: Secondary | ICD-10-CM

## 2020-06-10 DIAGNOSIS — I484 Atypical atrial flutter: Secondary | ICD-10-CM

## 2020-06-10 DIAGNOSIS — Z7901 Long term (current) use of anticoagulants: Secondary | ICD-10-CM | POA: Diagnosis not present

## 2020-06-10 DIAGNOSIS — Z950 Presence of cardiac pacemaker: Secondary | ICD-10-CM

## 2020-06-10 DIAGNOSIS — I509 Heart failure, unspecified: Secondary | ICD-10-CM | POA: Insufficient documentation

## 2020-06-10 DIAGNOSIS — D6869 Other thrombophilia: Secondary | ICD-10-CM

## 2020-06-10 NOTE — Progress Notes (Signed)
Patient to come to the atrial fibrillation clinic for a protracted episode of atrial flutter lasting for a week, with the intent to perform overdrive pacing as we did in April 2021.  However, the patient spontaneously converted to normal rhythm yesterday morning and is now in mostly atrial paced-ventricular paced rhythm, with occasional atrial sensed (premature atrial complexes)-ventricular paced beats.  She has been compliant with her anticoagulant without serious bleeding problems (she has a mild right scleral hemorrhage which is nonsymptomatic).  She is minimally aware of the irregular rhythm.  The atrial flutter has been well rate controlled with rates in the 70s (mostly 4: 1 AV block).  She denies problems with edema or worsening exertional dyspnea.  Comprehensive pacemaker interrogation is performed.  Device longevity is now estimated at about 2 years ([place] implanted in 2014), lead parameters were manually checked today and are excellent.  Heart rate histogram distribution is relatively blunted, but this is consistent with her sedentary lifestyle.  A couple of episodes of nonsustained VT are recorded, maximum 8 beats in duration and likewise asymptomatic.  The overall burden of atrial arrhythmia is relatively low.  On physical exam she appears to be quite comfortable.  Please refer to Roderic Palau, NP's note for vital signs.  She has a resolving right scleral hemorrhage.  No evidence of jugular venous distention.  Clear lungs.  Cardiovascular exam shows regular rate and rhythm, grade 3/6 aortic ejection murmur and 3/6 decrescendo diastolic murmur of aortic insufficiency heard best up and down the right sternal border, no evidence of lower extremity edema, normal distal pulses.  She has CKD stage IIIb and is due to have repeat labs with  [name], Junior, NP in just a couple of weeks.  Even if her creatinine has worsened and is now greater than 1.5, due to her age and weight she  would still qualify for the 5 mg twice daily dose.  Glycemic control is good.  She reports her last hemoglobin A1c was 7%.  I do not have a recent lipid profile.  Range less than 130/80 and she is taking an angiotensin receptor blocker.  She has gained weight but reports feeling better since doing this.  She last had an echocardiogram exactly a year ago and there was no change in left ventricular systolic function or the gradients or the mild perivalvular aortic insufficiency which is a chronic finding.  We did fact that as long as we can maintain sinus rhythm with safe methods, this is desirable.  I would not hesitate to perform overdrive pacing for her atrial flutter if she has prolonged episodes in the future.  However, since she tolerates the arrhythmia so well, I do not think it is necessary to prescribe antiarrhythmic medications.  Continue remote pacemaker downloads every 3 months.  Sanda Klein, MD, [facility] [phone] office [phone] pager

## 2020-06-10 NOTE — Addendum Note (Signed)
Encounter addended by: Sanda Klein, MD on: 06/10/2020 11:30 AM  Actions taken: Charge Capture section accepted, Clinical Note Signed

## 2020-06-10 NOTE — Progress Notes (Signed)
Primary Care Physician: Sonia Side., FNP Referring Physician: Dr. Roxy Cedar Tonya Glass is a 75 y.o. female with a h/o  HTN, CAD, HF, DM, PPM that is here for Dr. Sallyanne Kuster to try overdrive pacing as atrial flutter was found on her device since March 13. This may have been precipitated by a URI. She is on eliquis 5 mg bid and there has been no interruption in its use. CHA2DS2VASc score of at least 6.  F/u in afib clinic, 1/22/222,  for interrogation of device by Dr. Sallyanne Kuster to  determine rhythm and see if overdriving is needed to convert pt. By device clinic  interrogation Atrial flutter for the last week, interrogation today shows SR since yesterday am. The pt converted herself. She was asymptomatic with the arrhythmia.  She is on eliquis 5 mg bid for CHA2DS2VASc score of 6. No change in therapy today.   Today, she denies symptoms of palpitations, chest pain, shortness of breath, orthopnea, PND, lower extremity edema, dizziness, presyncope, syncope, or neurologic sequela. The patient is tolerating medications without difficulties and is otherwise without complaint today.   Past Medical History:  Diagnosis Date  . Anxiety   . Aortic valve stenosis 10/10/2016  . Arthritis    "fingers" (07/24/2017)  . CHF (congestive heart failure) ([place])   . COPD (chronic obstructive pulmonary disease) ([place])   . Depression   . Gallstones   . Heart murmur   . High cholesterol   . History of kidney stones   . Hypertension   . Hypothyroidism   . Migraines    "none in years til 01/2013-02/2013; none since"  (07/24/2017)  . Orthopnoea    "just since open heart surgery" (02/25/2013)  . Pacemaker    [employer]   . Pneumonia ~ 2012   "walking pneumonia"  . Pneumonia 07/23/2017  . Stroke [facility]) 2001   denies residual on 02/25/2013  . Symptomatic hypotension 10/10/2016  . Syncope and collapse    "prior to open heart surgery" (02/25/2013)  . Type II diabetes mellitus ([place])    Past Surgical  History:  Procedure Laterality Date  . BREAST SURGERY Right    "clamped blood vessel and stitched it up; in [place]"  . CARDIAC CATHETERIZATION    . CARDIAC VALVE REPLACEMENT  01/26/2013   "cow valve put in; widened the second valve" (02/25/2013) - [place], [place]  . CATARACT EXTRACTION W/ INTRAOCULAR LENS  IMPLANT, BILATERAL Bilateral 04/2017  . CHOLECYSTECTOMY OPEN  1971  . CYST EXCISION     "taken off my chest"  . INSERT / REPLACE / REMOVE PACEMAKER  01/19/2013   [employer] - dual chamber  . [place] RESECTION  2009   in [place]  . VEIN SURGERY Left    LLE; cut leg open and took out piece of vein that was protruding thru skin; stitched it up; done in [place]    Current Outpatient Medications  Medication Sig Dispense Refill  . albuterol (PROVENTIL HFA;VENTOLIN HFA) 108 (90 BASE) MCG/ACT inhaler Inhale 2 puffs into the lungs every 6 (six) hours as needed for wheezing or shortness of breath. Use as directed -  With exercise    . alendronate (FOSAMAX) 70 MG tablet Take 70 mg by mouth every 7 (seven) days. SUNDAYS    . atorvastatin (LIPITOR) 40 MG tablet Take 40 mg by mouth daily.    . carvedilol (COREG) 12.5 MG tablet Take 1 tablet (12.5 mg total) 2 (two) times daily by mouth.  180 tablet 3  . cloNIDine (CATAPRES) 0.2 MG tablet Take 1 tablet (0.2 mg total) by mouth at bedtime. 30 tablet 11  . ELIQUIS 5 MG TABS tablet Take 1 tablet by mouth twice daily 180 tablet 1  . ergocalciferol (VITAMIN D2) 1.25 MG (50000 UT) capsule Take 50,000 Units by mouth once a week.    . Ferrous Sulfate (IRON) 325 (65 Fe) MG TABS Take 65 mg by mouth daily.  0  . FLUoxetine (PROZAC) 40 MG capsule Take 40 mg by mouth daily.     . fluticasone (FLONASE) 50 MCG/ACT nasal spray Place 1 spray into both nostrils daily as needed for allergies.     . furosemide (LASIX) 40 MG tablet Take one tablet Monday, Wednesday and Friday (Patient taking differently: Take 40 mg by mouth daily. Take 1 tablet daily)  15 tablet 5  . gabapentin (NEURONTIN) 300 MG capsule Take 300 mg by mouth daily.    Marland Kitchen glipiZIDE (GLUCOTROL) 10 MG tablet Take 10 mg by mouth 2 (two) times daily before a meal.    . glucose blood test strip 1 each by Other route. Use as instructed    . guaiFENesin (MUCINEX) 600 MG 12 hr tablet Take 600 mg by mouth 2 (two) times daily.    Marland Kitchen HYDROcodone-acetaminophen (NORCO/VICODIN) 5-325 MG tablet Take 1 tablet by mouth every 6 (six) hours as needed for moderate pain or severe pain.    . hydrocortisone 2.5 % cream Apply 1 application topically daily as needed (dry skin).     Marland Kitchen levothyroxine (SYNTHROID, LEVOTHROID) 50 MCG tablet Take 50 mcg by mouth daily before breakfast.    . magnesium oxide (MAG-OX) 400 (241.3 Mg) MG tablet Take 1 tablet by mouth 2 (two) times daily.    . magnesium oxide (MAG-OX) 400 MG tablet Take 400 mg by mouth 2 (two) times daily.    . montelukast (SINGULAIR) 10 MG tablet Take 10 mg by mouth at bedtime.    Marland Kitchen nystatin ointment (MYCOSTATIN) Apply 1 application topically daily as needed (rash). 100,000- units/gram    . NYSTATIN powder Apply topically 2 (two) times daily.    Marland Kitchen olmesartan (BENICAR) 40 MG tablet Take 40 mg by mouth daily.    . Prenatal Vit-Fe Fumarate-FA (PREPLUS) 27-1 MG TABS Take 1 tablet by mouth daily.  3  . SYMBICORT 160-4.5 MCG/ACT inhaler Inhale 2 puffs into the lungs in the morning and at bedtime.     . temazepam (RESTORIL) 30 MG capsule Take 30 mg by mouth at bedtime.    . traMADol (ULTRAM) 50 MG tablet Take 50 mg by mouth every 6 (six) hours as needed for moderate pain.     Marland Kitchen triamcinolone ointment (KENALOG) 0.1 % Apply 1 application topically 3 (three) times daily.     . metFORMIN (GLUCOPHAGE) 1000 MG tablet Take 500 mg by mouth every evening.  (Patient not taking: Reported on 06/10/2020)     No current facility-administered medications for this encounter.    No Known Allergies  Social History   Socioeconomic History  . Marital status: Widowed     Spouse name: Not on file  . Number of children: 2  . Years of education: dental sch  . Highest education level: Not on file  Occupational History  . Occupation: retired  Tobacco Use  . Smoking status: Former Smoker    Packs/day: 2.00    Years: 40.00    Pack years: 80.00    Types: Cigarettes    Quit date: 05/29/1999  Years since quitting: 21.0  . Smokeless tobacco: Never Used  Vaping Use  . Vaping Use: Never used  Substance and Sexual Activity  . Alcohol use: No  . Drug use: No  . Sexual activity: Yes  Other Topics Concern  . Not on file  Social History Narrative  . Not on file   Social Determinants of Health   Financial Resource Strain: Not on file  Food Insecurity: Not on file  Transportation Needs: Not on file  Physical Activity: Not on file  Stress: Not on file  Social Connections: Not on file  Intimate Partner Violence: Not on file    Family History  Problem Relation Age of Onset  . Lung cancer Mother   . Heart Problems Father   . Hypertension Brother   . Colon polyps Brother   . Skin cancer Brother   . Colon polyps Daughter   . Diabetes Maternal Aunt   . Breast cancer Maternal Aunt        mets to liver  . Diabetes Paternal Aunt     ROS- All systems are reviewed and negative except as per the HPI above  Physical Exam: Vitals:   06/10/20 1031  BP: (!) 105/52  Pulse: (!) 59  Weight: 85.7 kg  Height: 5\' 1"  (1.549 m)   Wt Readings from Last 3 Encounters:  06/10/20 85.7 kg  10/01/19 77.6 kg  08/14/19 73.4 kg    Labs: Lab Results  Component Value Date   NA 144 03/24/2018   K 4.6 03/24/2018   CL 103 03/24/2018   CO2 24 03/24/2018   GLUCOSE 162 (H) 03/24/2018   BUN 29 (H) 03/24/2018   CREATININE 1.28 (H) 03/24/2018   CALCIUM 9.1 03/24/2018   MG 1.4 (L) 08/19/2017   No results found for: INR No results found for: CHOL, HDL, LDLCALC, TRIG   GEN- The patient is well appearing, alert and oriented x 3 today.   Head- normocephalic,  atraumatic Eyes- + conjunctival hemorrhage rt eye , conjunctiva pink Ears- hearing intact Oropharynx- clear Neck- supple, no JVP Lymph- no cervical lymphadenopathy Lungs- Clear to ausculation bilaterally, normal work of breathing Heart- Regular rate and rhythm, no murmurs, rubs or gallops, PMI not laterally displaced GI- soft, NT, ND, + BS Extremities- no clubbing, cyanosis, or edema MS- no significant deformity or atrophy Skin- no rash or lesion Psych- euthymic mood, full affect Neuro- strength and sensation are intact  EKG-none done today, interrogation of device by Dr. Loletha Grayer which showed conversion o SR yesterday am around 8 am   Assessment and Plan: 1. A flutter Present for one week with pt being asymptomatic Self converted   2. CHA2DS2VASc score of 6 Continue eliquis 5 mg bid  3. Conjunctival hemorrhage  Present  outer corner rt eye Reassured by Dr. Loletha Grayer benign  Per Dr. Loletha Grayer move pt's appointment form March out x  1 year   Geroge Baseman. Keenan Trefry, [facility] [address] [place], [place] [id number] [phone]

## 2020-06-17 ENCOUNTER — Other Ambulatory Visit: Payer: Self-pay | Admitting: Cardiovascular Disease

## 2020-07-13 ENCOUNTER — Ambulatory Visit (INDEPENDENT_AMBULATORY_CARE_PROVIDER_SITE_OTHER): Payer: Medicare Other

## 2020-07-13 DIAGNOSIS — I495 Sick sinus syndrome: Secondary | ICD-10-CM | POA: Diagnosis not present

## 2020-07-13 LAB — CUP PACEART REMOTE DEVICE CHECK
Battery Remaining Longevity: 30 mo
Battery Remaining Percentage: 45 %
Brady Statistic RA Percent Paced: 87 %
Brady Statistic RV Percent Paced: 86 %
Date Time Interrogation Session: 20220302044100
Implantable Lead Implant Date: 20140908
Implantable Lead Implant Date: 20140908
Implantable Lead Location: 753859
Implantable Lead Location: 753860
Implantable Lead Model: 4135
Implantable Lead Model: 4136
Implantable Lead Serial Number: 29485718
Implantable Lead Serial Number: 29487623
Implantable Pulse Generator Implant Date: 20140908
Lead Channel Impedance Value: 554 Ohm
Lead Channel Impedance Value: 765 Ohm
Lead Channel Pacing Threshold Amplitude: 0.7 V
Lead Channel Pacing Threshold Pulse Width: 0.4 ms
Lead Channel Setting Pacing Amplitude: 2 V
Lead Channel Setting Pacing Amplitude: 2.5 V
Lead Channel Setting Pacing Pulse Width: 0.4 ms
Lead Channel Setting Sensing Sensitivity: 2.5 mV
Pulse Gen Serial Number: 160873

## 2020-07-15 ENCOUNTER — Telehealth: Payer: Self-pay | Admitting: Cardiovascular Disease

## 2020-07-15 NOTE — Telephone Encounter (Signed)
    Pt c/o swelling: STAT is pt has developed SOB within 24 hours  1) How much weight have you gained and in what time span?   2) If swelling, where is the swelling located? Both ankles and feet  3) Are you currently taking a fluid pill? Yes  4) Are you currently SOB? No  5) Do you have a log of your daily weights (if so, list)? No  6) Have you gained 3 pounds in a day or 5 pounds in a week? no  7) Have you traveled recently? No   Pt said said when she wakes up there was no swelling, it started when she gets up and get dress and will swells up all day even when she's taking 40 mg of lasix a day. She don't know what to do and ask recommendation from Dr. Loletha Grayer

## 2020-07-15 NOTE — Telephone Encounter (Signed)
Returned the call to the patient. She stated that she has been having some swelling in her feet and ankles the last 3-4 days. She stated that the swelling goes down overngiht and worsens during the day.   She is asymptomatic except for the swelling. She stated that her weight stays the same and has not fluctuated. She stated she watches her sodium intake "sometimes" and prefers not to wear compression stockings because they are not comfortable.   She has been advised to watch her sodium intake and check the label of the food she eats for the sodium context. She has also been advised to elevate her legs when possible and to retry the compression stockings during the day.  She currently take 40 mg of Furosemide daily.

## 2020-07-18 NOTE — Telephone Encounter (Signed)
She has had some kidney function issues in the past and I am reluctant to blindly recommend an increased dose of diuretic. As long as edema improves overnight, OK to just observe. She last had labs w [facility] PCP 08/05/2019 and is probably due for a visit there soon, with labs. If not, please order BMET and BNP.

## 2020-07-18 NOTE — Telephone Encounter (Signed)
Call placed to the patient. She stated that the swelling was now better.   She had labs done at her PCP. Call placed to have those results faxed.

## 2020-07-21 NOTE — Progress Notes (Signed)
Remote pacemaker transmission.   

## 2020-07-25 ENCOUNTER — Encounter: Payer: Medicare Other | Admitting: Cardiovascular Disease

## 2020-08-01 ENCOUNTER — Encounter: Payer: Medicare Other | Admitting: Cardiovascular Disease

## 2020-09-07 ENCOUNTER — Other Ambulatory Visit: Payer: Self-pay | Admitting: Cardiovascular Disease

## 2020-09-07 NOTE — Telephone Encounter (Signed)
76F, 85.6KG, SCR 1.32 07/19/20, LOVW/CARROL 06/10/20

## 2020-10-12 ENCOUNTER — Ambulatory Visit (INDEPENDENT_AMBULATORY_CARE_PROVIDER_SITE_OTHER): Payer: Medicare Other

## 2020-10-12 DIAGNOSIS — I495 Sick sinus syndrome: Secondary | ICD-10-CM

## 2020-10-12 LAB — CUP PACEART REMOTE DEVICE CHECK
Battery Remaining Longevity: 24 mo
Battery Remaining Percentage: 42 %
Brady Statistic RA Percent Paced: 78 %
Brady Statistic RV Percent Paced: 91 %
Date Time Interrogation Session: 20220601044100
Implantable Lead Implant Date: 20140908
Implantable Lead Implant Date: 20140908
Implantable Lead Location: 753859
Implantable Lead Location: 753860
Implantable Lead Model: 4135
Implantable Lead Model: 4136
Implantable Lead Serial Number: 29485718
Implantable Lead Serial Number: 29487623
Implantable Pulse Generator Implant Date: 20140908
Lead Channel Impedance Value: 549 Ohm
Lead Channel Impedance Value: 727 Ohm
Lead Channel Pacing Threshold Amplitude: 0.7 V
Lead Channel Pacing Threshold Pulse Width: 0.4 ms
Lead Channel Setting Pacing Amplitude: 2 V
Lead Channel Setting Pacing Amplitude: 2.5 V
Lead Channel Setting Pacing Pulse Width: 0.4 ms
Lead Channel Setting Sensing Sensitivity: 2.5 mV
Pulse Gen Serial Number: 160873

## 2020-10-13 ENCOUNTER — Encounter: Payer: Self-pay | Admitting: Internal Medicine

## 2020-10-13 ENCOUNTER — Telehealth (INDEPENDENT_AMBULATORY_CARE_PROVIDER_SITE_OTHER): Payer: Medicare Other | Admitting: Internal Medicine

## 2020-10-13 VITALS — BP 102/64 | HR 71 | Wt 194.0 lb

## 2020-10-13 DIAGNOSIS — Z950 Presence of cardiac pacemaker: Secondary | ICD-10-CM

## 2020-10-13 DIAGNOSIS — Z953 Presence of xenogenic heart valve: Secondary | ICD-10-CM

## 2020-10-13 DIAGNOSIS — Z9889 Other specified postprocedural states: Secondary | ICD-10-CM

## 2020-10-13 DIAGNOSIS — I10 Essential (primary) hypertension: Secondary | ICD-10-CM

## 2020-10-13 DIAGNOSIS — I4892 Unspecified atrial flutter: Secondary | ICD-10-CM

## 2020-10-13 DIAGNOSIS — E785 Hyperlipidemia, unspecified: Secondary | ICD-10-CM

## 2020-10-13 NOTE — Progress Notes (Signed)
Virtual Visit via Telephone Note   This visit type was conducted due to national recommendations for restrictions regarding the COVID-19 Pandemic (e.g. social distancing) in an effort to limit this patient's exposure and mitigate transmission in our community.  Due to her co-morbid illnesses, this patient is at least at moderate risk for complications without adequate follow up.  This format is felt to be most appropriate for this patient at this time.  The patient did not have access to video technology/had technical difficulties with video requiring transitioning to audio format only (telephone).  All issues noted in this document were discussed and addressed.  No physical exam could be performed with this format.  Please refer to the patient's chart for her  consent to telehealth for [facility].   Evaluation Performed: Telephone follow-up  Date:  10/13/2020   ID:  Tonya Glass, DOB Sep 17, 1945, MRN [id number]  Patient Location:  [address] [place] [address]  Provider location:   [address] [place] [address] [place], [place] [address]  PCP:  Sonia Side., FNP  Cardiologist:  Pixie Casino, MD Electrophysiologist:  None   Chief Complaint: No complaints  History of Present Illness:    Tonya Glass is a 75 y.o. female who presents via audio/video conferencing for a telehealth visit today.  Tonya Glass is a pleasant 75 year old female that I am following with a history of aortic valve replacement, sick sinus syndrome status post pacemaker, diastolic congestive heart failure, COPD, difficult to control hypertension, dyslipidemia and type 2 diabetes.  She frequently visits friends in [place] and was recently in [place] just prior to the outbreak of coronavirus.  Fortunately she was not affected and her family gave her masks to bring back with her.  Since then she is essentially isolated at home.  She denies any worsening shortness of breath or chest  pain.  She had a remote pacer check in February which was functioning normally and has an upcoming remote pacer check soon.  She reports her PCP just did some blood work on her which was faxed to our office however I do not have that immediately available to review.  10/13/2020  Tonya Glass returns today for follow-up.  Overall she says she is feeling very well.  She just had a recent remote pacer check yesterday.  She was noted to have some short bursts of nonsustained VT up to 14 beats as well as some atrial flutter lasting over 3 hours at 1 point which spontaneously converted.  She had another episode in January with spontaneous conversion.  She says she is not really that symptomatic with palpitations.  She denies any chest pain or worsening shortness of breath.  Echo last year showed stable gradients across her bioprosthetic aortic valve without dilatation of her ascending aortic graft.  Her LVEF was normal.  In March of this year she was noted to have some increasing shortness of breath.  BNP was mildly elevated in the 300s and felt to be due to some diastolic congestive failure.  She also has some moderate chronic kidney disease.  Her Lasix was increased to 40 mg daily and she reports some improvement in her edema.  She still has some dependent edema.  It was reported by CT in 2019 that she had some dilatation of the descending thoracic aorta which was calcified.  This has not yet been reevaluated.  I recommended a repeat CT angiogram with the thoracic aorta to evaluate both the graft  and her descending aortic aneurysm however she declined stating that she "would not have surgery again".  The patient does not have symptoms concerning for COVID-19 infection (fever, chills, cough, or new SHORTNESS OF BREATH).   Prior CV studies:   The following studies were reviewed today:  Chart review  PMHx:  Past Medical History:  Diagnosis Date  . Anxiety   . Aortic valve stenosis 10/10/2016  . Arthritis     "fingers" (07/24/2017)  . CHF (congestive heart failure) ([place])   . COPD (chronic obstructive pulmonary disease) ([place])   . Depression   . Gallstones   . Heart murmur   . High cholesterol   . History of kidney stones   . Hypertension   . Hypothyroidism   . Migraines    "none in years til 01/2013-02/2013; none since"  (07/24/2017)  . Orthopnoea    "just since open heart surgery" (02/25/2013)  . Pacemaker    [employer]   . Pneumonia ~ 2012   "walking pneumonia"  . Pneumonia 07/23/2017  . Stroke [facility]) 2001   denies residual on 02/25/2013  . Symptomatic hypotension 10/10/2016  . Syncope and collapse    "prior to open heart surgery" (02/25/2013)  . Type II diabetes mellitus ([place])     Past Surgical History:  Procedure Laterality Date  . BREAST SURGERY Right    "clamped blood vessel and stitched it up; in [place]"  . CARDIAC CATHETERIZATION    . CARDIAC VALVE REPLACEMENT  01/26/2013   "cow valve put in; widened the second valve" (02/25/2013) - [place], [place]  . CATARACT EXTRACTION W/ INTRAOCULAR LENS  IMPLANT, BILATERAL Bilateral 04/2017  . CHOLECYSTECTOMY OPEN  1971  . CYST EXCISION     "taken off my chest"  . INSERT / REPLACE / REMOVE PACEMAKER  01/19/2013   [employer] - dual chamber  . [place] RESECTION  2009   in [place]  . VEIN SURGERY Left    LLE; cut leg open and took out piece of vein that was protruding thru skin; stitched it up; done in [place]    FAMHx:  Family History  Problem Relation Age of Onset  . Lung cancer Mother   . Heart Problems Father   . Hypertension Brother   . Colon polyps Brother   . Skin cancer Brother   . Colon polyps Daughter   . Diabetes Maternal Aunt   . Breast cancer Maternal Aunt        mets to liver  . Diabetes Paternal Aunt     SOCHx:   reports that she quit smoking about 21 years ago. Her smoking use included cigarettes. She has a 80.00 pack-year smoking history. She has never used smokeless tobacco.  She reports that she does not drink alcohol and does not use drugs.  ALLERGIES:  No Known Allergies  MEDS:  Current Meds  Medication Sig  . albuterol (PROVENTIL HFA;VENTOLIN HFA) 108 (90 BASE) MCG/ACT inhaler Inhale 2 puffs into the lungs every 6 (six) hours as needed for wheezing or shortness of breath. Use as directed -  With exercise  . alendronate (FOSAMAX) 70 MG tablet Take 70 mg by mouth every 7 (seven) days. SUNDAYS  . atorvastatin (LIPITOR) 40 MG tablet Take 40 mg by mouth daily.  . carvedilol (COREG) 12.5 MG tablet Take 1 tablet (12.5 mg total) 2 (two) times daily by mouth.  . cloNIDine (CATAPRES) 0.2 MG tablet Take 1 tablet (0.2 mg total) by mouth at bedtime.  Marland Kitchen  ELIQUIS 5 MG TABS tablet Take 1 tablet by mouth twice daily  . ergocalciferol (VITAMIN D2) 1.25 MG (50000 UT) capsule Take 50,000 Units by mouth once a week.  . Ferrous Sulfate (IRON) 325 (65 Fe) MG TABS Take 65 mg by mouth daily.  Marland Kitchen FLUoxetine (PROZAC) 40 MG capsule Take 40 mg by mouth daily.   . fluticasone (FLONASE) 50 MCG/ACT nasal spray Place 1 spray into both nostrils daily as needed for allergies.   . furosemide (LASIX) 40 MG tablet Take 40 mg by mouth daily.  Marland Kitchen gabapentin (NEURONTIN) 300 MG capsule Take 300 mg by mouth daily.  Marland Kitchen glipiZIDE (GLUCOTROL) 10 MG tablet Take 10 mg by mouth 2 (two) times daily before a meal.  . glucose blood test strip 1 each by Other route. Use as instructed  . HYDROcodone-acetaminophen (NORCO/VICODIN) 5-325 MG tablet Take 1 tablet by mouth every 6 (six) hours as needed for moderate pain or severe pain.  . hydrocortisone 2.5 % cream Apply 1 application topically daily as needed (dry skin).   Marland Kitchen levothyroxine (SYNTHROID, LEVOTHROID) 50 MCG tablet Take 50 mcg by mouth daily before breakfast.  . magnesium oxide (MAG-OX) 400 (241.3 Mg) MG tablet Take 1 tablet by mouth 2 (two) times daily.  . metFORMIN (GLUCOPHAGE) 1000 MG tablet Take 500 mg by mouth every evening.  . montelukast (SINGULAIR)  10 MG tablet Take 10 mg by mouth at bedtime.  Marland Kitchen nystatin ointment (MYCOSTATIN) Apply 1 application topically daily as needed (rash). 100,000- units/gram  . olmesartan (BENICAR) 40 MG tablet Take 40 mg by mouth daily.  . Prenatal Vit-Fe Fumarate-FA (PREPLUS) 27-1 MG TABS Take 1 tablet by mouth daily.  . SYMBICORT 160-4.5 MCG/ACT inhaler Inhale 2 puffs into the lungs in the morning and at bedtime.   . temazepam (RESTORIL) 30 MG capsule Take 30 mg by mouth at bedtime.  . traMADol (ULTRAM) 50 MG tablet Take 50 mg by mouth every 6 (six) hours as needed for moderate pain.   . [DISCONTINUED] furosemide (LASIX) 40 MG tablet Take one tablet Monday, Wednesday and Friday (Patient taking differently: Take 40 mg by mouth daily. Take 1 tablet daily)  . [DISCONTINUED] triamcinolone ointment (KENALOG) 0.1 % Apply 1 application topically 3 (three) times daily.      ROS: Pertinent items noted in HPI and remainder of comprehensive ROS otherwise negative.  Labs/Other Tests and Data Reviewed:    Recent Labs: No results found for requested labs within last 8760 hours.   Recent Lipid Panel No results found for: CHOL, TRIG, HDL, CHOLHDL, LDLCALC, LDLDIRECT  Wt Readings from Last 3 Encounters:  10/13/20 194 lb (88 kg)  06/10/20 189 lb (85.7 kg)  10/01/19 171 lb (77.6 kg)     Exam:    Vital Signs:  BP 102/64   Pulse 71   Wt 194 lb (88 kg)   BMI 36.66 kg/m    Exam not performed due to telephone visit  ASSESSMENT & PLAN:    1. Chronic diastolic congestive heart failure - stable AV gradient, LVEF 55-60% (2021) 2. S/p [employer] pacemaker - complete heart block 3. ? Paroxysmal atrial flutter - no high rate episodes noted on her pacemaker  4. 01/26/13 - Aortic valve replacement with 23 mm Perimount Magna  Ease bovine pericardial bioprosthesis by [facility] and ascending aortic  replacement with 28 mm Terumo interposition graft ([facility]) 5. 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 HTN - labile 7.  Hyperlipidemia 8. DM2 9. 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 Labile hypertension 11. CAD - 40% mid-LAD stenosis  Mrs. Kates says she is doing well.  Blood pressure appears to be well controlled.  She has had some nonsustained VT as well as paroxysmal atrial flutter.  She is not that symptomatic with it.  She is anticoagulated.  Weight has gone up some.  She is on daily diuretic.  She still struggles with some lower extremity edema.  BNP was mildly elevated in March.  She is not interested in any surgery in the future and therefore does not want to do any follow-up imaging of her thoracic aorta.  LVEF by echo in 2021 was normal.  Given her lower blood pressure, would be difficult to consider controlling her short bursts of NSVT or atrial flutter any better.  We would have to consider possibly decreasing clonidine and may be increasing her carvedilol.  I propose this but she declined any medication changes at this time.  She will continue to have remote pacer checks monthly and follow-up with Dr. Loletha Grayer annually and me annually as well.  COVID-19 Education: The signs and symptoms of COVID-19 were discussed with the patient and how to seek care for testing (follow up with PCP or arrange E-visit).  The importance of social distancing was discussed today.  Patient Risk:   After full review of this patients clinical status, I feel that they are at least moderate risk at this time.  Time:   Today, I have spent 25 minutes with the patient with telehealth technology discussing bioprosthetic aortic valve, pacemaker, hypertension, hyperlipidemia, chronic diastolic heart failure.     Medication Adjustments/Labs and Tests Ordered: Current medicines are reviewed at length with the patient today.  Concerns regarding medicines are outlined above.   Tests Ordered: No orders of the defined types were  placed in this encounter.   Medication Changes: No orders of the defined types were placed in this encounter.   Disposition:  in 1 year(s)  Pixie Casino, MD, [facility], [place] Director of the Advanced Lipid Disorders &  Cardiovascular Risk Reduction Clinic Diplomate of the American Board of Clinical Lipidology Attending Cardiologist  Direct Dial: [phone]  Fax: [phone]  Website:  [email or web address]  Pixie Casino, MD  10/13/2020 8:07 AM

## 2020-10-13 NOTE — Patient Instructions (Signed)

## 2020-11-04 NOTE — Progress Notes (Signed)
Remote pacemaker transmission.   

## 2020-11-09 NOTE — Telephone Encounter (Signed)
Open in error

## 2021-01-11 ENCOUNTER — Ambulatory Visit (INDEPENDENT_AMBULATORY_CARE_PROVIDER_SITE_OTHER): Payer: Medicare Other

## 2021-01-11 DIAGNOSIS — I495 Sick sinus syndrome: Secondary | ICD-10-CM

## 2021-01-17 LAB — CUP PACEART REMOTE DEVICE CHECK
Battery Remaining Longevity: 24 mo
Battery Remaining Percentage: 40 %
Brady Statistic RA Percent Paced: 77 %
Brady Statistic RV Percent Paced: 89 %
Date Time Interrogation Session: 20220831044100
Implantable Lead Implant Date: 20140908
Implantable Lead Implant Date: 20140908
Implantable Lead Location: 753859
Implantable Lead Location: 753860
Implantable Lead Model: 4135
Implantable Lead Model: 4136
Implantable Lead Serial Number: 29485718
Implantable Lead Serial Number: 29487623
Implantable Pulse Generator Implant Date: 20140908
Lead Channel Impedance Value: 536 Ohm
Lead Channel Impedance Value: 688 Ohm
Lead Channel Pacing Threshold Amplitude: 0.7 V
Lead Channel Pacing Threshold Pulse Width: 0.4 ms
Lead Channel Setting Pacing Amplitude: 2 V
Lead Channel Setting Pacing Amplitude: 2.5 V
Lead Channel Setting Pacing Pulse Width: 0.4 ms
Lead Channel Setting Sensing Sensitivity: 2.5 mV
Pulse Gen Serial Number: 160873

## 2021-01-24 NOTE — Progress Notes (Signed)
Remote pacemaker transmission.   

## 2021-01-29 ENCOUNTER — Encounter (HOSPITAL_COMMUNITY): Payer: Self-pay

## 2021-01-29 ENCOUNTER — Other Ambulatory Visit: Payer: Self-pay

## 2021-01-29 ENCOUNTER — Emergency Department (HOSPITAL_COMMUNITY): Payer: Medicare Other

## 2021-01-29 ENCOUNTER — Emergency Department (HOSPITAL_COMMUNITY)
Admission: EM | Admit: 2021-01-29 | Discharge: 2021-01-30 | Disposition: A | Discharge status: Home / Self Care | Payer: Medicare Other | Attending: Emergency Medicine | Admitting: Emergency Medicine

## 2021-01-29 DIAGNOSIS — E039 Hypothyroidism, unspecified: Secondary | ICD-10-CM | POA: Insufficient documentation

## 2021-01-29 DIAGNOSIS — J449 Chronic obstructive pulmonary disease, unspecified: Secondary | ICD-10-CM | POA: Insufficient documentation

## 2021-01-29 DIAGNOSIS — Z950 Presence of cardiac pacemaker: Secondary | ICD-10-CM | POA: Insufficient documentation

## 2021-01-29 DIAGNOSIS — Z7951 Long term (current) use of inhaled steroids: Secondary | ICD-10-CM | POA: Diagnosis not present

## 2021-01-29 DIAGNOSIS — Z7901 Long term (current) use of anticoagulants: Secondary | ICD-10-CM | POA: Diagnosis not present

## 2021-01-29 DIAGNOSIS — I5031 Acute diastolic (congestive) heart failure: Secondary | ICD-10-CM | POA: Diagnosis not present

## 2021-01-29 DIAGNOSIS — E119 Type 2 diabetes mellitus without complications: Secondary | ICD-10-CM | POA: Diagnosis not present

## 2021-01-29 DIAGNOSIS — I110 Hypertensive heart disease with heart failure: Secondary | ICD-10-CM | POA: Diagnosis not present

## 2021-01-29 DIAGNOSIS — L97811 Non-pressure chronic ulcer of other part of right lower leg limited to breakdown of skin: Secondary | ICD-10-CM

## 2021-01-29 DIAGNOSIS — L97921 Non-pressure chronic ulcer of unspecified part of left lower leg limited to breakdown of skin: Secondary | ICD-10-CM | POA: Insufficient documentation

## 2021-01-29 DIAGNOSIS — Z79899 Other long term (current) drug therapy: Secondary | ICD-10-CM | POA: Insufficient documentation

## 2021-01-29 DIAGNOSIS — Z7984 Long term (current) use of oral hypoglycemic drugs: Secondary | ICD-10-CM | POA: Diagnosis not present

## 2021-01-29 DIAGNOSIS — I2510 Atherosclerotic heart disease of native coronary artery without angina pectoris: Secondary | ICD-10-CM | POA: Diagnosis not present

## 2021-01-29 DIAGNOSIS — Z87891 Personal history of nicotine dependence: Secondary | ICD-10-CM | POA: Diagnosis not present

## 2021-01-29 DIAGNOSIS — R600 Localized edema: Secondary | ICD-10-CM | POA: Diagnosis present

## 2021-01-29 DIAGNOSIS — I872 Venous insufficiency (chronic) (peripheral): Secondary | ICD-10-CM

## 2021-01-29 LAB — CBC WITH DIFFERENTIAL/PLATELET
Abs Immature Granulocytes: 0.02 10*3/uL (ref 0.00–0.07)
Basophils Absolute: 0.1 10*3/uL (ref 0.0–0.1)
Basophils Relative: 1 %
Eosinophils Absolute: 0.2 10*3/uL (ref 0.0–0.5)
Eosinophils Relative: 2 %
HCT: 34.7 % — ABNORMAL LOW (ref 36.0–46.0)
Hemoglobin: 10.8 g/dL — ABNORMAL LOW (ref 12.0–15.0)
Immature Granulocytes: 0 %
Lymphocytes Relative: 19 %
Lymphs Abs: 1.6 10*3/uL (ref 0.7–4.0)
MCH: 29.5 pg (ref 26.0–34.0)
MCHC: 31.1 g/dL (ref 30.0–36.0)
MCV: 94.8 fL (ref 80.0–100.0)
Monocytes Absolute: 0.6 10*3/uL (ref 0.1–1.0)
Monocytes Relative: 7 %
Neutro Abs: 5.9 10*3/uL (ref 1.7–7.7)
Neutrophils Relative %: 71 %
Platelets: 189 10*3/uL (ref 150–400)
RBC: 3.66 MIL/uL — ABNORMAL LOW (ref 3.87–5.11)
RDW: 15.5 % (ref 11.5–15.5)
WBC: 8.3 10*3/uL (ref 4.0–10.5)
nRBC: 0 % (ref 0.0–0.2)

## 2021-01-29 LAB — COMPREHENSIVE METABOLIC PANEL
ALT: 16 U/L (ref 0–44)
AST: 20 U/L (ref 15–41)
Albumin: 3.4 g/dL — ABNORMAL LOW (ref 3.5–5.0)
Alkaline Phosphatase: 161 U/L — ABNORMAL HIGH (ref 38–126)
Anion gap: 8 (ref 5–15)
BUN: 27 mg/dL — ABNORMAL HIGH (ref 8–23)
CO2: 24 mmol/L (ref 22–32)
Calcium: 8.9 mg/dL (ref 8.9–10.3)
Chloride: 106 mmol/L (ref 98–111)
Creatinine, Ser: 1.76 mg/dL — ABNORMAL HIGH (ref 0.44–1.00)
GFR, Estimated: 30 mL/min — ABNORMAL LOW (ref >60)
Glucose, Bld: 121 mg/dL — ABNORMAL HIGH (ref 70–99)
Potassium: 5.4 mmol/L — ABNORMAL HIGH (ref 3.5–5.1)
Sodium: 138 mmol/L (ref 135–145)
Total Bilirubin: 0.6 mg/dL (ref 0.3–1.2)
Total Protein: 6.2 g/dL — ABNORMAL LOW (ref 6.5–8.1)

## 2021-01-29 NOTE — ED Provider Notes (Signed)
[facility] EMERGENCY DEPARTMENT Provider Note   CSN: [id number] Arrival date & time: 01/29/21  1916     History Chief Complaint  Patient presents with   Wound Check    Tonya Glass is a 75 y.o. female.  Patient presents to the emergency department for evaluation of wound on leg.  Patient reports that the area started as a blister on her leg.  The skin is now peeling off of the region and there is continuous oozing of fluid as well as some blood.  She is on Eliquis chronically.  Patient denies direct trauma to the area.  Patient was seen at urgent care and started on antibiotics.  Patient reports that the area is not healing.  She was also told at urgent care that the wound might be related to her congestive heart failure.      Past Medical History:  Diagnosis Date   Anxiety    Aortic valve stenosis 10/10/2016   Arthritis    "fingers" (07/24/2017)   CHF (congestive heart failure) (HCC)    COPD (chronic obstructive pulmonary disease) (HCC)    Depression    Gallstones    Heart murmur    High cholesterol    History of kidney stones    Hypertension    Hypothyroidism    Migraines    "none in years til 01/2013-02/2013; none since"  (07/24/2017)   Orthopnoea    "just since open heart surgery" (02/25/2013)   Pacemaker    [employer]    Pneumonia ~ 2012   "walking pneumonia"   Pneumonia 07/23/2017   Stroke ([place]) 2001   denies residual on 02/25/2013   Symptomatic hypotension 10/10/2016   Syncope and collapse    "prior to open heart surgery" (02/25/2013)   Type II diabetes mellitus ([place])     Patient Active Problem List   Diagnosis Date Noted   Thoracic aortic aneurysm without rupture ([place]) 03/19/2018   Nonrheumatic aortic (valve) insufficiency 03/19/2018   Acute CHF (congestive heart failure) ([place]) [id number]   Acute diastolic CHF (congestive heart failure) ([place]) 08/18/2017   CHF (congestive heart failure) ([place]) 08/18/2017   Hypothyroidism  07/23/2017   Acute respiratory failure with hypoxia ([place]) 07/23/2017   Pneumonia 07/23/2017   Aortic valve stenosis [id number]   Diastolic murmur [id number]   SSS (sick sinus syndrome) ([place]) 03/20/2016   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   CAD (coronary artery disease) 09/06/2014   Pacemaker 05/28/2013   Essential hypertension 05/28/2013   Dyslipidemia 05/28/2013   S/P aortic valve replacement with bioprosthetic valve 05/28/2013   DM2 (diabetes mellitus, type 2) ([place]) 05/28/2013   Chronic heart failure ([place]) 05/28/2013    Past Surgical History:  Procedure Laterality Date   BREAST SURGERY Right    "clamped blood vessel and stitched it up; in [place]"   [place]  01/26/2013   "cow valve put in; widened the second valve" (02/25/2013) - [place]   CATARACT EXTRACTION W/ INTRAOCULAR LENS  IMPLANT, BILATERAL Bilateral 04/2017   CHOLECYSTECTOMY OPEN  1971   CYST EXCISION     "taken off my chest"   INSERT / REPLACE / REMOVE PACEMAKER  01/19/2013   [place] RESECTION  2009   in [place]   VEIN SURGERY Left    LLE; cut leg open and took out piece of vein that was protruding thru skin; stitched it up; done in [place]  OB History   No obstetric history on file.     Family History  Problem Relation Age of Onset   Lung cancer Mother    Heart Problems Father    Hypertension Brother    Colon polyps Brother    Skin cancer Brother    Colon polyps Daughter    Diabetes Maternal Aunt    Breast cancer Maternal Aunt        mets to liver   Diabetes Paternal Aunt     Social History   Tobacco Use   Smoking status: Former    Packs/day: 2.00    Years: 40.00    Pack years: 80.00    Types: Cigarettes    Quit date: 05/29/1999    Years since quitting: 21.6   Smokeless tobacco: Never  Vaping Use   Vaping Use: Never used  Substance Use Topics   Alcohol use: No   Drug use: No     Home Medications Prior to Admission medications   Medication Sig Start Date End Date Taking? Authorizing Provider  albuterol (PROVENTIL HFA;VENTOLIN HFA) 108 (90 BASE) MCG/ACT inhaler Inhale 2 puffs into the lungs every 6 (six) hours as needed for wheezing or shortness of breath. Use as directed -  With exercise 12/08/13   [provider]  alendronate (FOSAMAX) 70 MG tablet Take 70 mg by mouth every 7 (seven) days. SUNDAYS 07/13/17   [provider]  atorvastatin (LIPITOR) 40 MG tablet Take 40 mg by mouth daily.    [provider]  carvedilol (COREG) 12.5 MG tablet Take 1 tablet (12.5 mg total) 2 (two) times daily by mouth. 03/22/17   Croitoru, Mihai, MD  cloNIDine (CATAPRES) 0.2 MG tablet Take 1 tablet (0.2 mg total) by mouth at bedtime. 09/18/17   Hilty, Nadean Corwin, MD  ELIQUIS 5 MG TABS tablet Take 1 tablet by mouth twice daily 09/07/20   Sherran Needs, NP  ergocalciferol (VITAMIN D2) 1.25 MG (50000 UT) capsule Take 50,000 Units by mouth once a week.    [provider]  Ferrous Sulfate (IRON) 325 (65 Fe) MG TABS Take 65 mg by mouth daily. 08/13/17   [provider]  FLUoxetine (PROZAC) 40 MG capsule Take 40 mg by mouth daily.     [provider]  fluticasone (FLONASE) 50 MCG/ACT nasal spray Place 1 spray into both nostrils daily as needed for allergies.  08/14/19   [provider]  furosemide (LASIX) 40 MG tablet Take 40 mg by mouth daily.    [provider]  gabapentin (NEURONTIN) 300 MG capsule Take 300 mg by mouth daily.    [provider]  glipiZIDE (GLUCOTROL) 10 MG tablet Take 10 mg by mouth 2 (two) times daily before a meal.    [provider]  glucose blood test strip 1 each by Other route. Use as instructed    [provider]  HYDROcodone-acetaminophen (NORCO/VICODIN) 5-325 MG tablet Take 1 tablet by mouth every 6 (six) hours as needed for moderate pain or severe pain.    [provider]  hydrocortisone 2.5 % cream Apply 1 application topically daily as needed (dry skin).  07/15/17   [provider]  levothyroxine (SYNTHROID, LEVOTHROID) 50 MCG tablet Take 50 mcg by mouth daily before breakfast.    [provider]  magnesium oxide (MAG-OX) 400 (241.3 Mg) MG tablet Take 1 tablet by mouth 2 (two) times daily. 06/02/20   [provider]  metFORMIN (GLUCOPHAGE) 1000 MG tablet Take 500  mg by mouth every evening. 08/25/19   [provider]  montelukast (SINGULAIR) 10 MG tablet Take 10 mg by mouth at bedtime.    [provider]  nystatin ointment (MYCOSTATIN) Apply 1 application topically daily as needed (rash). 100,000- units/gram    [provider]  olmesartan (BENICAR) 40 MG tablet Take 40 mg by mouth daily. 04/02/20   [provider]  Prenatal Vit-Fe Fumarate-FA (PREPLUS) 27-1 MG TABS Take 1 tablet by mouth daily. 08/14/17   [provider]  STIOLTO RESPIMAT 2.5-2.5 MCG/ACT AERS Inhale 2 puffs into the lungs daily. 10/12/20   [provider]  SYMBICORT 160-4.5 MCG/ACT inhaler Inhale 2 puffs into the lungs in the morning and at bedtime.  09/12/18   [provider]  temazepam (RESTORIL) 30 MG capsule Take 30 mg by mouth at bedtime. 07/07/17   [provider]  traMADol (ULTRAM) 50 MG tablet Take 50 mg by mouth every 6 (six) hours as needed for moderate pain.  07/23/13   [provider]    Allergies    Patient has no known allergies.  Review of Systems   Review of Systems  Cardiovascular:  Positive for leg swelling.  Skin:  Positive for wound.  All other systems reviewed and are negative.  Physical Exam Updated Vital Signs BP (!) 144/55   Pulse 67   Temp 98.5 F (36.9 C) (Oral)   Resp 16   SpO2 99%   Physical Exam Vitals and nursing note reviewed.  Constitutional:      General: She is not in acute distress.    Appearance: Normal appearance. She is well-developed.  HENT:      Head: Normocephalic and atraumatic.     Right Ear: Hearing normal.     Left Ear: Hearing normal.     Nose: Nose normal.  Eyes:     Conjunctiva/sclera: Conjunctivae normal.     Pupils: Pupils are equal, round, and reactive to light.  Cardiovascular:     Rate and Rhythm: Regular rhythm.     Heart sounds: S1 normal and S2 normal. No murmur heard.   No friction rub. No gallop.  Pulmonary:     Effort: Pulmonary effort is normal. No respiratory distress.     Breath sounds: Normal breath sounds.  Chest:     Chest wall: No tenderness.  Abdominal:     General: Bowel sounds are normal.     Palpations: Abdomen is soft.     Tenderness: There is no abdominal tenderness. There is no guarding or rebound. Negative signs include Murphy's sign and McBurney's sign.     Hernia: No hernia is present.  Musculoskeletal:        General: Normal range of motion.     Cervical back: Normal range of motion and neck supple.     Right lower leg: 2+ Pitting Edema present.     Left lower leg: 2+ Pitting Edema present.  Skin:    General: Skin is warm and dry.     Findings: Erythema and wound (area of desquamation anterior lower right leg) present. No rash.  Neurological:     Mental Status: She is alert and oriented to person, place, and time.     GCS: GCS eye subscore is 4. GCS verbal subscore is 5. GCS motor subscore is 6.     Cranial Nerves: No cranial nerve deficit.     Sensory: No sensory deficit.     Coordination: Coordination normal.  Psychiatric:  Speech: Speech normal.        Behavior: Behavior normal.        Thought Content: Thought content normal.    ED Results / Procedures / Treatments   Labs (all labs ordered are listed, but only abnormal results are displayed) Labs Reviewed  CBC WITH DIFFERENTIAL/PLATELET - Abnormal; Notable for the following components:      Result Value   RBC 3.66 (*)    Hemoglobin 10.8 (*)    HCT 34.7 (*)    All other components within normal limits   COMPREHENSIVE METABOLIC PANEL - Abnormal; Notable for the following components:   Potassium 5.4 (*)    Glucose, Bld 121 (*)    BUN 27 (*)    Creatinine, Ser 1.76 (*)    Total Protein 6.2 (*)    Albumin 3.4 (*)    Alkaline Phosphatase 161 (*)    GFR, Estimated 30 (*)    All other components within normal limits  BRAIN NATRIURETIC PEPTIDE    EKG None  Radiology DG Chest 2 View  Result Date: 01/30/2021 CLINICAL DATA:  Edema.  Wound on the right tibia. EXAM: CHEST - 2 VIEW COMPARISON:  08/18/2017 FINDINGS: Postoperative changes in the mediastinum. Cardiac pacemaker. Cardiac enlargement. No vascular congestion, edema, or consolidation. No pleural effusions. No pneumothorax. Mediastinal contours appear intact. Calcification of the aorta. Old right rib fractures. IMPRESSION: Cardiac enlargement.  No evidence of active pulmonary disease. Electronically Signed   By: Lucienne Capers M.D.   On: 01/30/2021 00:10   DG Tibia/Fibula Right  Result Date: 01/30/2021 CLINICAL DATA:  Open wound on the anterior lower aspect of the right tibia starting a small blisters in progressing. EXAM: RIGHT TIBIA AND FIBULA - 2 VIEW COMPARISON:  None. FINDINGS: Sclerosis in the distal right femur could represent enchondroma or bone infarct. Tibia and fibula appear intact. No acute fracture or dislocation. No bone erosion or cortical changes. No evidence of osteomyelitis. Soft tissues are unremarkable. No radiopaque soft tissue foreign bodies or soft tissue gas identified. IMPRESSION: No acute bony abnormalities. No radiopaque soft tissue foreign bodies or gas. Sclerosis in the distal femur likely representing infarct or enchondroma. Electronically Signed   By: Lucienne Capers M.D.   On: 01/30/2021 00:03    Procedures Procedures   Medications Ordered in ED Medications - No data to display  ED Course  I have reviewed the triage vital signs and the nursing notes.  Pertinent labs & imaging results that were  available during my care of the patient were reviewed by me and considered in my medical decision making (see chart for details).    MDM Rules/Calculators/A&P                           Patient presents to the emergency department for evaluation of wound on her leg.  Patient has a history of chronic lower extremity edema, takes Lasix daily.  She also has a history of valvular disease.  She was told that she needed to get checked out for possible decompensated congestive heart failure when she presented for her wound to urgent care.  Work-up today is reassuring.  I do not see any evidence of decompensated heart failure.  Patient currently on oral and topical antibiotics.  No sign of active infection at this time.  Instructed on wound care, elevation.  She will follow-up with primary care for referral to wound care if necessary.  Final Clinical Impression(s) / ED  Diagnoses Final diagnoses:  Venous stasis ulcer of other part of right lower leg limited to breakdown of skin without varicose veins ([place])    Rx / DC Orders ED Discharge Orders     None        Lenia Housley, Gwenyth Allegra, MD 01/30/21 [phone]

## 2021-01-29 NOTE — ED Provider Notes (Signed)
Emergency Medicine Provider Triage Evaluation Note  Tonya Glass , a 75 y.o. female  was evaluated in triage.  Pt complains of right lower extremity wound she states that she has been treated at urgent care with antibiotics which she has been taking.  She does not know if top of the head what antibiotic she is on. She denies any fevers or chills.  She states that she had some small bumps on her right lower leg which on Tuesday seem to open up and seep.  She states that she has had some fluid expressed from this area.  States that it is also now bleeding some.  Denies any systemic symptoms such as fevers chills fatigue malaise nausea vomiting  Review of Systems  Positive: Right lower extremity wound Negative: Fever  Physical Exam  There were no vitals taken for this visit. Gen:   Awake, no distress   Resp:  Normal effort  MSK:   Moves extremities without difficulty  Other:  Right lower extremity with wound on the medial aspect of the right lower extremity near the ankle  Medical Decision Making  Medically screening exam initiated at 7:40 PM.  Appropriate orders placed.  Tonya Glass was informed that the remainder of the evaluation will be completed by another provider, this initial triage assessment does not replace that evaluation, and the importance of remaining in the ED until their evaluation is complete.  Will obtain CBC CMP.  Patient does not appear toxic or significantly ill at this time.  She will await full evaluation in major care   Tedd Sias, [place] 01/29/21 1941    Orpah Greek, MD 01/29/21 2320

## 2021-01-29 NOTE — ED Triage Notes (Signed)
Pt states she has a wound on her R leg, went to UC on Tuesday, gave antibiotics and told to follow up on Friday, wound is not getting better and continues to drain, denies fevers. Pt has a blister on leg that continues bleeding and pt is on a blood thinner

## 2021-01-30 NOTE — ED Notes (Signed)
MD at bedside for eval.

## 2021-01-30 NOTE — ED Notes (Signed)
Pt's right leg dressed with non-adherent dressing and ace wrap.

## 2021-02-23 ENCOUNTER — Other Ambulatory Visit: Payer: Self-pay | Admitting: Family

## 2021-02-23 ENCOUNTER — Other Ambulatory Visit: Payer: Self-pay | Admitting: Specialist

## 2021-02-23 DIAGNOSIS — I83018 Varicose veins of right lower extremity with ulcer other part of lower leg: Secondary | ICD-10-CM

## 2021-02-23 DIAGNOSIS — M5451 Vertebrogenic low back pain: Secondary | ICD-10-CM

## 2021-02-28 ENCOUNTER — Emergency Department (HOSPITAL_COMMUNITY): Payer: Medicare Other

## 2021-02-28 ENCOUNTER — Other Ambulatory Visit: Payer: Self-pay

## 2021-02-28 ENCOUNTER — Other Ambulatory Visit: Payer: Medicare Other

## 2021-02-28 ENCOUNTER — Emergency Department (HOSPITAL_COMMUNITY)
Admission: EM | Admit: 2021-02-28 | Discharge: 2021-02-28 | Disposition: A | Discharge status: Home / Self Care | Payer: Medicare Other | Attending: Emergency Medicine | Admitting: Emergency Medicine

## 2021-02-28 ENCOUNTER — Encounter (HOSPITAL_COMMUNITY): Payer: Self-pay

## 2021-02-28 DIAGNOSIS — R0602 Shortness of breath: Secondary | ICD-10-CM | POA: Diagnosis present

## 2021-02-28 DIAGNOSIS — I110 Hypertensive heart disease with heart failure: Secondary | ICD-10-CM | POA: Diagnosis not present

## 2021-02-28 DIAGNOSIS — R600 Localized edema: Secondary | ICD-10-CM | POA: Diagnosis not present

## 2021-02-28 DIAGNOSIS — Z79899 Other long term (current) drug therapy: Secondary | ICD-10-CM | POA: Diagnosis not present

## 2021-02-28 DIAGNOSIS — E039 Hypothyroidism, unspecified: Secondary | ICD-10-CM | POA: Insufficient documentation

## 2021-02-28 DIAGNOSIS — Z950 Presence of cardiac pacemaker: Secondary | ICD-10-CM | POA: Diagnosis not present

## 2021-02-28 DIAGNOSIS — Z7901 Long term (current) use of anticoagulants: Secondary | ICD-10-CM | POA: Diagnosis not present

## 2021-02-28 DIAGNOSIS — Z87891 Personal history of nicotine dependence: Secondary | ICD-10-CM | POA: Insufficient documentation

## 2021-02-28 DIAGNOSIS — E119 Type 2 diabetes mellitus without complications: Secondary | ICD-10-CM | POA: Diagnosis not present

## 2021-02-28 DIAGNOSIS — I2510 Atherosclerotic heart disease of native coronary artery without angina pectoris: Secondary | ICD-10-CM | POA: Insufficient documentation

## 2021-02-28 DIAGNOSIS — Z7984 Long term (current) use of oral hypoglycemic drugs: Secondary | ICD-10-CM | POA: Insufficient documentation

## 2021-02-28 DIAGNOSIS — J069 Acute upper respiratory infection, unspecified: Secondary | ICD-10-CM | POA: Diagnosis not present

## 2021-02-28 DIAGNOSIS — R609 Edema, unspecified: Secondary | ICD-10-CM

## 2021-02-28 DIAGNOSIS — J441 Chronic obstructive pulmonary disease with (acute) exacerbation: Secondary | ICD-10-CM | POA: Insufficient documentation

## 2021-02-28 DIAGNOSIS — Z7951 Long term (current) use of inhaled steroids: Secondary | ICD-10-CM | POA: Diagnosis not present

## 2021-02-28 DIAGNOSIS — I5031 Acute diastolic (congestive) heart failure: Secondary | ICD-10-CM | POA: Diagnosis not present

## 2021-02-28 LAB — COMPREHENSIVE METABOLIC PANEL
ALT: 18 U/L (ref 0–44)
AST: 25 U/L (ref 15–41)
Albumin: 3.6 g/dL (ref 3.5–5.0)
Alkaline Phosphatase: 103 U/L (ref 38–126)
Anion gap: 10 (ref 5–15)
BUN: 29 mg/dL — ABNORMAL HIGH (ref 8–23)
CO2: 26 mmol/L (ref 22–32)
Calcium: 8.6 mg/dL — ABNORMAL LOW (ref 8.9–10.3)
Chloride: 104 mmol/L (ref 98–111)
Creatinine, Ser: 1.68 mg/dL — ABNORMAL HIGH (ref 0.44–1.00)
GFR, Estimated: 32 mL/min — ABNORMAL LOW (ref >60)
Glucose, Bld: 103 mg/dL — ABNORMAL HIGH (ref 70–99)
Potassium: 4.1 mmol/L (ref 3.5–5.1)
Sodium: 140 mmol/L (ref 135–145)
Total Bilirubin: 0.5 mg/dL (ref 0.3–1.2)
Total Protein: 6.8 g/dL (ref 6.5–8.1)

## 2021-02-28 LAB — CBC WITH DIFFERENTIAL/PLATELET
Abs Immature Granulocytes: 0.03 10*3/uL (ref 0.00–0.07)
Basophils Absolute: 0 10*3/uL (ref 0.0–0.1)
Basophils Relative: 0 %
Eosinophils Absolute: 0.3 10*3/uL (ref 0.0–0.5)
Eosinophils Relative: 4 %
HCT: 32.6 % — ABNORMAL LOW (ref 36.0–46.0)
Hemoglobin: 10.3 g/dL — ABNORMAL LOW (ref 12.0–15.0)
Immature Granulocytes: 0 %
Lymphocytes Relative: 21 %
Lymphs Abs: 1.5 10*3/uL (ref 0.7–4.0)
MCH: 29.6 pg (ref 26.0–34.0)
MCHC: 31.6 g/dL (ref 30.0–36.0)
MCV: 93.7 fL (ref 80.0–100.0)
Monocytes Absolute: 0.4 10*3/uL (ref 0.1–1.0)
Monocytes Relative: 6 %
Neutro Abs: 4.7 10*3/uL (ref 1.7–7.7)
Neutrophils Relative %: 69 %
Platelets: 163 10*3/uL (ref 150–400)
RBC: 3.48 MIL/uL — ABNORMAL LOW (ref 3.87–5.11)
RDW: 16.4 % — ABNORMAL HIGH (ref 11.5–15.5)
WBC: 6.9 10*3/uL (ref 4.0–10.5)
nRBC: 0 % (ref 0.0–0.2)

## 2021-02-28 LAB — BRAIN NATRIURETIC PEPTIDE: B Natriuretic Peptide: 519.1 pg/mL — ABNORMAL HIGH (ref 0.0–100.0)

## 2021-02-28 MED ORDER — ALBUTEROL SULFATE HFA 108 (90 BASE) MCG/ACT IN AERS: 2.0000 | INHALATION_SPRAY | RESPIRATORY_TRACT | Status: DC | PRN

## 2021-02-28 MED ORDER — DEXAMETHASONE 4 MG PO TABS
10.0000 mg | ORAL_TABLET | Freq: Once | ORAL | Status: AC
  Administered 2021-02-28: 10 mg via ORAL
  Filled 2021-02-28: qty 2

## 2021-02-28 MED ORDER — FUROSEMIDE 10 MG/ML IJ SOLN
40.0000 mg | Freq: Once | INTRAMUSCULAR | Status: AC
  Administered 2021-02-28: 40 mg via INTRAVENOUS
  Filled 2021-02-28: qty 4

## 2021-02-28 MED ORDER — IPRATROPIUM-ALBUTEROL 0.5-2.5 (3) MG/3ML IN SOLN: 3.0000 mL | Freq: Once | RESPIRATORY_TRACT | Status: DC

## 2021-02-28 NOTE — ED Triage Notes (Signed)
Patient reports a history of CHF. Patient c/o of SOB and bilateral feet swelling x 3 days.

## 2021-02-28 NOTE — ED Provider Notes (Signed)
[facility] DEPT Provider Note   CSN: [id number] Arrival date & time: 02/28/21  1327     History Chief Complaint  Patient presents with   Shortness of Breath   Foot Swelling    Tonya Glass is a 75 y.o. female.  75 yo F with a chief complaints of leg swelling cough shortness of breath.  This been going on for a couple days.  Patient was post have a follow-up with her family doctor bedside come here instead.  No fevers or chills.  No chest pain or pressure.  The history is provided by the patient.  Shortness of Breath Severity:  Moderate Onset quality:  Gradual Duration:  2 days Timing:  Constant Progression:  Worsening Chronicity:  New Relieved by:  Nothing Worsened by:  Nothing Ineffective treatments:  None tried Associated symptoms: cough   Associated symptoms: no chest pain, no fever, no headaches, no vomiting and no wheezing       Past Medical History:  Diagnosis Date   Anxiety    Aortic valve stenosis 10/10/2016   Arthritis    "fingers" (07/24/2017)   CHF (congestive heart failure) (HCC)    COPD (chronic obstructive pulmonary disease) (HCC)    Depression    Gallstones    Heart murmur    High cholesterol    History of kidney stones    Hypertension    Hypothyroidism    Migraines    "none in years til 01/2013-02/2013; none since"  (07/24/2017)   Orthopnoea    "just since open heart surgery" (02/25/2013)   Pacemaker    [employer]    Pneumonia ~ 2012   "walking pneumonia"   Pneumonia 07/23/2017   Stroke ([place]) 2001   denies residual on 02/25/2013   Symptomatic hypotension 10/10/2016   Syncope and collapse    "prior to open heart surgery" (02/25/2013)   Type II diabetes mellitus ([place])     Patient Active Problem List   Diagnosis Date Noted   Thoracic aortic aneurysm without rupture 03/19/2018   Nonrheumatic aortic (valve) insufficiency 03/19/2018   Acute CHF (congestive heart failure) ([place]) 33/29/5188   Acute  diastolic CHF (congestive heart failure) ([place]) 08/18/2017   CHF (congestive heart failure) ([place]) 08/18/2017   Hypothyroidism 07/23/2017   Acute respiratory failure with hypoxia ([place]) 07/23/2017   Pneumonia 07/23/2017   Aortic valve stenosis 41/66/0630   Diastolic murmur 16/05/930   SSS (sick sinus syndrome) ([place]) 03/20/2016   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   CAD (coronary artery disease) 09/06/2014   Pacemaker 05/28/2013   Essential hypertension 05/28/2013   Dyslipidemia 05/28/2013   S/P aortic valve replacement with bioprosthetic valve 05/28/2013   DM2 (diabetes mellitus, type 2) ([place]) 05/28/2013   Chronic heart failure ([place]) 05/28/2013    Past Surgical History:  Procedure Laterality Date   BREAST SURGERY Right    "clamped blood vessel and stitched it up; in [place]"   [place]  01/26/2013   "cow valve put in; widened the second valve" (02/25/2013) - [place], [place]   CATARACT EXTRACTION W/ INTRAOCULAR LENS  IMPLANT, BILATERAL Bilateral 04/2017   CHOLECYSTECTOMY OPEN  1971   CYST EXCISION     "taken off my chest"   INSERT / REPLACE / REMOVE PACEMAKER  01/19/2013   [place] - dual chamber   LAPAROSCOPIC GASTRIC SLEEVE RESECTION  2009   in [place]   VEIN SURGERY Left    LLE; cut leg open and  took out piece of vein that was protruding thru skin; stitched it up; done in [place]     OB History   No obstetric history on file.     Family History  Problem Relation Age of Onset   Lung cancer Mother    Heart Problems Father    Hypertension Brother    Colon polyps Brother    Skin cancer Brother    Colon polyps Daughter    Diabetes Maternal Aunt    Breast cancer Maternal Aunt        mets to liver   Diabetes Paternal Aunt     Social History   Tobacco Use   Smoking status: Former    Packs/day: 2.00    Years: 40.00    Pack years: 80.00    Types: Cigarettes    Quit date: 05/29/1999    Years since quitting: 21.7    Smokeless tobacco: Never  Vaping Use   Vaping Use: Never used  Substance Use Topics   Alcohol use: No   Drug use: No    Home Medications Prior to Admission medications   Medication Sig Start Date End Date Taking? Authorizing Provider  albuterol (PROVENTIL HFA;VENTOLIN HFA) 108 (90 BASE) MCG/ACT inhaler Inhale 2 puffs into the lungs every 6 (six) hours as needed for wheezing or shortness of breath. Use as directed -  With exercise 12/08/13   [provider]  alendronate (FOSAMAX) 70 MG tablet Take 70 mg by mouth every 7 (seven) days. SUNDAYS 07/13/17   [provider]  atorvastatin (LIPITOR) 40 MG tablet Take 40 mg by mouth daily.    [provider]  carvedilol (COREG) 12.5 MG tablet Take 1 tablet (12.5 mg total) 2 (two) times daily by mouth. 03/22/17   Croitoru, Mihai, MD  cloNIDine (CATAPRES) 0.2 MG tablet Take 1 tablet (0.2 mg total) by mouth at bedtime. 09/18/17   Hilty, Nadean Corwin, MD  ELIQUIS 5 MG TABS tablet Take 1 tablet by mouth twice daily 09/07/20   Sherran Needs, NP  ergocalciferol (VITAMIN D2) 1.25 MG (50000 UT) capsule Take 50,000 Units by mouth once a week.    [provider]  Ferrous Sulfate (IRON) 325 (65 Fe) MG TABS Take 65 mg by mouth daily. 08/13/17   [provider]  FLUoxetine (PROZAC) 40 MG capsule Take 40 mg by mouth daily.     [provider]  fluticasone (FLONASE) 50 MCG/ACT nasal spray Place 1 spray into both nostrils daily as needed for allergies.  08/14/19   [provider]  furosemide (LASIX) 40 MG tablet Take 40 mg by mouth daily.    [provider]  gabapentin (NEURONTIN) 300 MG capsule Take 300 mg by mouth daily.    [provider]  glipiZIDE (GLUCOTROL) 10 MG tablet Take 10 mg by mouth 2 (two) times daily before a meal.    [provider]  glucose blood test strip 1 each by Other route. Use as instructed    [provider]  HYDROcodone-acetaminophen (NORCO/VICODIN) 5-325  MG tablet Take 1 tablet by mouth every 6 (six) hours as needed for moderate pain or severe pain.    [provider]  hydrocortisone 2.5 % cream Apply 1 application topically daily as needed (dry skin).  07/15/17   [provider]  levothyroxine (SYNTHROID, LEVOTHROID) 50 MCG tablet Take 50 mcg by mouth daily before breakfast.    [provider]  magnesium oxide (MAG-OX) 400 (241.3 Mg) MG tablet Take 1 tablet  by mouth 2 (two) times daily. 06/02/20   [provider]  metFORMIN (GLUCOPHAGE) 1000 MG tablet Take 500 mg by mouth every evening. 08/25/19   [provider]  montelukast (SINGULAIR) 10 MG tablet Take 10 mg by mouth at bedtime.    [provider]  nystatin ointment (MYCOSTATIN) Apply 1 application topically daily as needed (rash). 100,000- units/gram    [provider]  olmesartan (BENICAR) 40 MG tablet Take 40 mg by mouth daily. 04/02/20   [provider]  Prenatal Vit-Fe Fumarate-FA (PREPLUS) 27-1 MG TABS Take 1 tablet by mouth daily. 08/14/17   [provider]  STIOLTO RESPIMAT 2.5-2.5 MCG/ACT AERS Inhale 2 puffs into the lungs daily. 10/12/20   [provider]  SYMBICORT 160-4.5 MCG/ACT inhaler Inhale 2 puffs into the lungs in the morning and at bedtime.  09/12/18   [provider]  temazepam (RESTORIL) 30 MG capsule Take 30 mg by mouth at bedtime. 07/07/17   [provider]  traMADol (ULTRAM) 50 MG tablet Take 50 mg by mouth every 6 (six) hours as needed for moderate pain.  07/23/13   [provider]    Allergies    Patient has no known allergies.  Review of Systems   Review of Systems  Constitutional:  Negative for chills and fever.  HENT:  Negative for congestion and rhinorrhea.   Eyes:  Negative for redness and visual disturbance.  Respiratory:  Positive for cough and shortness of breath. Negative for wheezing.   Cardiovascular:  Negative for chest pain and palpitations.   Gastrointestinal:  Negative for nausea and vomiting.  Genitourinary:  Negative for dysuria and urgency.  Musculoskeletal:  Negative for arthralgias and myalgias.  Skin:  Negative for pallor and wound.  Neurological:  Negative for dizziness and headaches.   Physical Exam Updated Vital Signs BP 128/62   Pulse 70   Temp 97.7 F (36.5 C) (Oral)   Resp 18   Ht 5\' 1"  (1.549 m)   Wt 90.3 kg   SpO2 100%   BMI 37.60 kg/m   Physical Exam Vitals and nursing note reviewed.  Constitutional:      General: She is not in acute distress.    Appearance: She is well-developed. She is not diaphoretic.  HENT:     Head: Normocephalic and atraumatic.  Eyes:     Pupils: Pupils are equal, round, and reactive to light.  Cardiovascular:     Rate and Rhythm: Normal rate and regular rhythm.     Heart sounds: No murmur heard.   No friction rub. No gallop.  Pulmonary:     Effort: Pulmonary effort is normal.     Breath sounds: Wheezing present. No rales.     Comments: Mildly diminished breath sounds with wheezes and prolonged expiratory effort. Abdominal:     General: There is no distension.     Palpations: Abdomen is soft.     Tenderness: There is no abdominal tenderness.  Musculoskeletal:        General: No tenderness.     Cervical back: Normal range of motion and neck supple.     Right lower leg: Edema present.     Left lower leg: Edema present.     Comments: 1+ edema to bilateral lower extremities up to the mid calf.  Skin:    General: Skin is warm and dry.  Neurological:     Mental Status: She is alert and oriented to person, place, and time.  Psychiatric:  Behavior: Behavior normal.    ED Results / Procedures / Treatments   Labs (all labs ordered are listed, but only abnormal results are displayed) Labs Reviewed  CBC WITH DIFFERENTIAL/PLATELET - Abnormal; Notable for the following components:      Result Value   RBC 3.48 (*)    Hemoglobin 10.3 (*)    HCT 32.6 (*)    RDW  16.4 (*)    All other components within normal limits  COMPREHENSIVE METABOLIC PANEL - Abnormal; Notable for the following components:   Glucose, Bld 103 (*)    BUN 29 (*)    Creatinine, Ser 1.68 (*)    Calcium 8.6 (*)    GFR, Estimated 32 (*)    All other components within normal limits  BRAIN NATRIURETIC PEPTIDE - Abnormal; Notable for the following components:   B Natriuretic Peptide 519.1 (*)    All other components within normal limits    EKG None  Radiology DG Chest 2 View  Result Date: 02/28/2021 CLINICAL DATA:  Shortness of breath and congestive heart failure. Lower extremity swelling. EXAM: CHEST - 2 VIEW COMPARISON:  01/29/2021 FINDINGS: Previous median sternotomy and aortic valve replacement. Dual lead pacemaker with leads in the region of the right atrium and right ventricle. Chronic cardiomegaly. Possible pulmonary venous hypertension but no edema or effusion. No infiltrate or collapse. IMPRESSION: Previous aortic valve replacement.  Pacemaker. Cardiomegaly. Possible pulmonary venous hypertension but no frank edema or effusion. Electronically Signed   By: Nelson Chimes M.D.   On: 02/28/2021 15:39    Procedures Procedures   Medications Ordered in ED Medications  furosemide (LASIX) injection 40 mg (has no administration in time range)  dexamethasone (DECADRON) tablet 10 mg (has no administration in time range)    ED Course  I have reviewed the triage vital signs and the nursing notes.  Pertinent labs & imaging results that were available during my care of the patient were reviewed by me and considered in my medical decision making (see chart for details).    MDM Rules/Calculators/A&P                           75 yo F with a chief complaints of shortness of breath cough and lower extremity edema.  The patient has a history of heart failure and is on Lasix.  Denies any recent change in her medications or any change in her fluid intake.  She has however been coughing  and having a bit of a runny nose and congestion.  Most likely the patient has a viral syndrome.  Obtain a chest x-ray BNP give a bolus dose of Lasix we will give a DuoNeb.  Reassess.  Lab work without significant change from baseline.  Anemia but not significance no significant electrolyte abnormality renal function appears to be at baseline.  Chest x-ray viewed by me without obvious edema.  No obvious focal infiltrate.  On reassessment the patient has not yet received any albuterol.  At this point she feels well and would prefer to go home as opposed to get a treatment.  We will give her a bolus dose of Lasix.  Have her follow-up with her family doctor.  4:01 PM:  I have discussed the diagnosis/risks/treatment options with the patient and family and believe the pt to be eligible for discharge home to follow-up with PCP. We also discussed returning to the ED immediately if new or worsening sx occur. We discussed the sx  which are most concerning (e.g., sudden worsening pain, fever, inability to tolerate by mouth, need to use inhaler more often than every 4 hours) that necessitate immediate return. Medications administered to the patient during their visit and any new prescriptions provided to the patient are listed below.  Medications given during this visit Medications  furosemide (LASIX) injection 40 mg (has no administration in time range)  dexamethasone (DECADRON) tablet 10 mg (has no administration in time range)     The patient appears reasonably screen and/or stabilized for discharge and I doubt any other medical condition or other [facility] requiring further screening, evaluation, or treatment in the ED at this time prior to discharge.   Final Clinical Impression(s) / ED Diagnoses Final diagnoses:  Peripheral edema  COPD exacerbation ([place])  URI with cough and congestion    Rx / DC Orders ED Discharge Orders     None        Deno Etienne, DO 02/28/21 1601

## 2021-02-28 NOTE — Discharge Instructions (Signed)
Use your breathing medicine every 4 hours while awake, return for sudden worsening shortness of breath, or if you need to use your inhaler more often.

## 2021-03-03 ENCOUNTER — Ambulatory Visit
Admission: RE | Admit: 2021-03-03 | Discharge: 2021-03-03 | Disposition: A | Discharge status: Home / Self Care | Payer: Medicare Other | Source: Ambulatory Visit | Attending: Specialist | Admitting: Specialist

## 2021-03-03 DIAGNOSIS — M5451 Vertebrogenic low back pain: Secondary | ICD-10-CM

## 2021-03-15 ENCOUNTER — Telehealth: Payer: Self-pay | Admitting: Cardiovascular Disease

## 2021-03-15 MED ORDER — ELIQUIS 5 MG PO TABS
5.0000 mg | ORAL_TABLET | Freq: Two times a day (BID) | ORAL | 1 refills | Status: DC
Start: 2021-03-15 — End: 2021-07-05

## 2021-03-15 NOTE — Telephone Encounter (Signed)
Prescription refill request for Eliquis received. Last office visit:Tonya Glass 06/10/20 Scr: 1.68 02/28/21 Age: 40f Weight:90.3kg

## 2021-03-15 NOTE — Telephone Encounter (Signed)
Follow Up:    Patient called back and said her medicine can be delivered today if called before 12:00 today. She is going out of town tomorrow.    *STAT* If patient is at the pharmacy, call can be transferred to refill team.   1. Which medications need to be refilled? (please list name of each medication and dose if known) Eliquis  2. Which pharmacy/location (including street and city if local pharmacy) is medication to be sent to?  [address] [place]  3. Do they need a 30 day or 90 day supply? 90 days and refills- Please call before 12:00 today

## 2021-03-15 NOTE — Telephone Encounter (Signed)
*  STAT* If patient is at the pharmacy, call can be transferred to refill team.   1. Which medications need to be refilled? (please list name of each medication and dose if known)  ELIQUIS 5 MG TABS tablet  2. Which pharmacy/location (including street and city if local pharmacy) is medication to be sent to? [facility] [place], [place]  3. Do they need a 30 day or 90 day supply? 90 with refills   Patient is out of medication

## 2021-03-31 ENCOUNTER — Telehealth: Payer: Self-pay | Admitting: Internal Medicine

## 2021-03-31 NOTE — Telephone Encounter (Signed)
Spoke with patient regarding her reporting swelling in legs and SOB. She stated that her breathing is good; that she has COPD and congestion. She does not feel as though her breathing is worse than any other time. Her concern is an open sore on her leg that started as a "small bump that she popped and the bump became bigger." She has a prescription for mupirocin ointment. I told her to apply the ointment to the open sore and cover it with a sterile bandage, that she can change the bandage daily, or if soiled with the yellow drainage she described, to change it as needed. She finished a prescription of doxycycline in October, ordered at the ED. She reports having several bumps on both legs. Was instructed not to pop them. She has an appointment with Dr. Sallyanne Kuster on 11/30. Explained to patient that if she does get SOB between now and her appointment, that she needs to go to the ED. Patient voiced understanding of this conversation. Will forward to Dr. Sallyanne Kuster.

## 2021-03-31 NOTE — Telephone Encounter (Signed)
   Pt c/o swelling: STAT is pt has developed SOB within 24 hours  If swelling, where is the swelling located? Right legs  How much weight have you gained and in what time span?   Have you gained 3 pounds in a day or 5 pounds in a week?   Do you have a log of your daily weights (if so, list)?   Are you currently taking a fluid pill? Yes   Are you currently SOB? Yes, on and off  Have you traveled recently? No   Pt and her daughter called, she said she's been having leg pain and swelling on her right leg, the left leg is also starting to swell up, the daughter also mentioned pt is retaining fluid in her lungs. She's seeing Dr. Loletha Grayer and Dr. Debara Pickett. She made an appt with Dr. Loletha Grayer on 11/30

## 2021-04-03 NOTE — Telephone Encounter (Signed)
Spoke with the patient. She has been made aware of Dr. Victorino December recommendations. She will call back if anything is needed prior.

## 2021-04-05 ENCOUNTER — Telehealth: Payer: Self-pay | Admitting: Internal Medicine

## 2021-04-05 NOTE — Telephone Encounter (Signed)
Pt c/o swelling: STAT is pt has developed SOB within 24 hours  If swelling, where is the swelling located? Legs right above ankles   How much weight have you gained and in what time span? Not much   Have you gained 3 pounds in a day or 5 pounds in a week? no  Do you have a log of your daily weights (if so, list)? no  Are you currently taking a fluid pill? yes  Are you currently SOB? no  Have you traveled recently? no    pt has a buildup of fluid on her legs which are popping and causing sores some open and some closed. Pt is wanting to know if she is able to wear compression socks... please advise.

## 2021-04-05 NOTE — Telephone Encounter (Signed)
Spoke with pt, for the last month she has had issues with swelling and SOB. Her SOB has gotten better but she still has swelling in her legs and now has some sores that are weeping. Discusses support hose and information for elastic therapy in  given to the patient. Fluid and sodium restriction discussed. She was given the okay to increase the furosemide to 40 mg twice daily for 2 to 3 days to see if that helps with the swelling and sores. She has a follow up appointment with dr croitoru on 04/12/21 and will make sure to keep that appointment. She will call back to the office with issues prior to appointment.

## 2021-04-12 ENCOUNTER — Encounter: Payer: Self-pay | Admitting: Cardiovascular Disease

## 2021-04-12 ENCOUNTER — Ambulatory Visit (INDEPENDENT_AMBULATORY_CARE_PROVIDER_SITE_OTHER): Payer: Medicare Other

## 2021-04-12 ENCOUNTER — Other Ambulatory Visit: Payer: Self-pay

## 2021-04-12 ENCOUNTER — Ambulatory Visit (INDEPENDENT_AMBULATORY_CARE_PROVIDER_SITE_OTHER): Payer: Medicare Other | Admitting: Cardiovascular Disease

## 2021-04-12 VITALS — BP 143/68 | HR 70 | Ht 61.0 in | Wt 207.0 lb

## 2021-04-12 DIAGNOSIS — E1122 Type 2 diabetes mellitus with diabetic chronic kidney disease: Secondary | ICD-10-CM

## 2021-04-12 DIAGNOSIS — I4892 Unspecified atrial flutter: Secondary | ICD-10-CM

## 2021-04-12 DIAGNOSIS — I2510 Atherosclerotic heart disease of native coronary artery without angina pectoris: Secondary | ICD-10-CM | POA: Diagnosis not present

## 2021-04-12 DIAGNOSIS — I83019 Varicose veins of right lower extremity with ulcer of unspecified site: Secondary | ICD-10-CM | POA: Diagnosis not present

## 2021-04-12 DIAGNOSIS — I83029 Varicose veins of left lower extremity with ulcer of unspecified site: Secondary | ICD-10-CM

## 2021-04-12 DIAGNOSIS — L97929 Non-pressure chronic ulcer of unspecified part of left lower leg with unspecified severity: Secondary | ICD-10-CM

## 2021-04-12 DIAGNOSIS — I10 Essential (primary) hypertension: Secondary | ICD-10-CM

## 2021-04-12 DIAGNOSIS — E039 Hypothyroidism, unspecified: Secondary | ICD-10-CM

## 2021-04-12 DIAGNOSIS — Z953 Presence of xenogenic heart valve: Secondary | ICD-10-CM | POA: Diagnosis not present

## 2021-04-12 DIAGNOSIS — L97919 Non-pressure chronic ulcer of unspecified part of right lower leg with unspecified severity: Secondary | ICD-10-CM

## 2021-04-12 DIAGNOSIS — I441 Atrioventricular block, second degree: Secondary | ICD-10-CM | POA: Diagnosis not present

## 2021-04-12 DIAGNOSIS — I5033 Acute on chronic diastolic (congestive) heart failure: Secondary | ICD-10-CM

## 2021-04-12 DIAGNOSIS — I495 Sick sinus syndrome: Secondary | ICD-10-CM | POA: Diagnosis not present

## 2021-04-12 DIAGNOSIS — I351 Nonrheumatic aortic (valve) insufficiency: Secondary | ICD-10-CM

## 2021-04-12 DIAGNOSIS — N1832 Chronic kidney disease, stage 3b: Secondary | ICD-10-CM

## 2021-04-12 DIAGNOSIS — D649 Anemia, unspecified: Secondary | ICD-10-CM

## 2021-04-12 DIAGNOSIS — Z950 Presence of cardiac pacemaker: Secondary | ICD-10-CM

## 2021-04-12 DIAGNOSIS — T82897D Other specified complication of cardiac prosthetic devices, implants and grafts, subsequent encounter: Secondary | ICD-10-CM

## 2021-04-12 DIAGNOSIS — E785 Hyperlipidemia, unspecified: Secondary | ICD-10-CM

## 2021-04-12 LAB — CUP PACEART REMOTE DEVICE CHECK
Battery Remaining Longevity: 18 mo
Battery Remaining Percentage: 35 %
Brady Statistic RA Percent Paced: 78 %
Brady Statistic RV Percent Paced: 90 %
Date Time Interrogation Session: 20221130045200
Implantable Lead Implant Date: 20140908
Implantable Lead Implant Date: 20140908
Implantable Lead Location: 753859
Implantable Lead Location: 753860
Implantable Lead Model: 4135
Implantable Lead Model: 4136
Implantable Lead Serial Number: 29485718
Implantable Lead Serial Number: 29487623
Implantable Pulse Generator Implant Date: 20140908
Lead Channel Impedance Value: 548 Ohm
Lead Channel Impedance Value: 688 Ohm
Lead Channel Pacing Threshold Amplitude: 0.7 V
Lead Channel Pacing Threshold Pulse Width: 0.4 ms
Lead Channel Setting Pacing Amplitude: 2 V
Lead Channel Setting Pacing Amplitude: 2.5 V
Lead Channel Setting Pacing Pulse Width: 0.4 ms
Lead Channel Setting Sensing Sensitivity: 2.5 mV
Pulse Gen Serial Number: 160873

## 2021-04-12 MED ORDER — CARVEDILOL 6.25 MG PO TABS: 6.2500 mg | ORAL_TABLET | Freq: Two times a day (BID) | ORAL | 3 refills | Status: DC

## 2021-04-12 MED ORDER — FUROSEMIDE 40 MG PO TABS: 80.0000 mg | ORAL_TABLET | Freq: Every day | ORAL | 3 refills | Status: DC

## 2021-04-12 NOTE — Patient Instructions (Signed)
Medication Instructions:  DECREASE the Carvedilol to 6.25 mg twice daily  INCREASE the Furosemide to 80 mg once daily  *If you need a refill on your cardiac medications before your next appointment, please call your pharmacy*   Lab Work: Your provider would like for you to return in 10 days to have the following labs drawn: BNP and BMET. You do not need an appointment for the lab. Once in our office lobby there is a podium where you can sign in and ring the doorbell to alert [place] that you are here. The lab is open from 8:00 am to 4:30 pm; closed for lunch from 12:45pm-1:45pm.  If you have labs (blood work) drawn today and your tests are completely normal, you will receive your results only by: [place] (if you have MyChart) OR A paper copy in the mail If you have any lab test that is abnormal or we need to change your treatment, we will call you to review the results.   Testing/Procedures: Your physician has requested that you have an echocardiogram. Echocardiography is a painless test that uses sound waves to create images of your heart. It provides your doctor with information about the size and shape of your heart and how well your heart's chambers and valves are working. You may receive an ultrasound enhancing agent through an IV if needed to better visualize your heart during the echo.This procedure takes approximately one hour. There are no restrictions for this procedure. This will take place at the [address].   Your physician has requested that you have a lower extremity venous duplex. This test is an ultrasound of the veins in the legs. It looks at venous blood flow that carries blood from the heart to the legs or arms. Allow one hour for a Lower Venous exam. There are no restrictions or special instructions.  Follow-Up: At [facility], you and your health needs are our priority.  As part of our continuing mission to provide you with exceptional heart care,  we have created designated Provider Care Teams.  These Care Teams include your primary Cardiologist (physician) and Advanced Practice Providers (APPs -  Physician Assistants and Nurse Practitioners) who all work together to provide you with the care you need, when you need it.  We recommend signing up for the patient portal called "MyChart".  Sign up information is provided on this After Visit Summary.  MyChart is used to connect with patients for Virtual Visits (Telemedicine).  Patients are able to view lab/test results, encounter notes, upcoming appointments, etc.  Non-urgent messages can be sent to your provider as well.   To learn more about what you can do with MyChart, go to [email or web address].    Your next appointment:   2 month(s) on a device day  The format for your next appointment:   In Person  Provider:   Sanda Klein, MD

## 2021-04-12 NOTE — Progress Notes (Signed)
Cardiology Office Note    Date:  04/13/2021   ID:  Tonya Glass, DOB Dec 11, 1945, MRN [id number]  PCP:  Sonia Side., FNP  Cardiologist:  Kathy Breach, M.D.; Sanda Klein, MD   Chief complaint: Pacemaker follow-up ; venous stasis ulcer  History of Present Illness:  Tonya Glass is a 75 y.o. 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  She had paroxysmal atrial flutter and underwent successful overdrive pacing in 7673, with recurrence in January 2022 that resolved spontaneously.  She has minor CAD by previous angiography (40% mid LAD pre-AVR 2014).    She has known mild paravalvular aortic insufficiency ever since prosthetic valve implantation.  She is accompanied today by her daughter.  Recently her biggest problems have been related to chronic edema of both lower extremities that she has developed a large venous stasis ulcer on the lateral surface of the right calf in the supravalvular area.  She also has small blisters on the left medial supra malleoli where there are at risk of developing into a larger lesion.  At 207 pounds her current weight is 8 pounds higher than it was a month ago, 18 pounds higher than it was in January and about 30 pounds higher than it was in 2020.  She denies angina, palpitations, dizziness lightheadedness or syncope.  A BNP checked last month was elevated at 519 (and her creatinine was 1.68), similar to January 2019 when the BNP was 550 (but at that time her creatinine was 0.65, [facility]).  N-terminal proBNP was 8992 in March 2021.  Her creatinine is 1.68, similar to September, substantially better than early  2019 when it was 2.5-3.0, but higher than November 2019 when it was 1.28 and 2021 when it was down to 1.0 (latter labs from [facility]).  Most recent hemoglobin from last month is 10.3.  She has NYHA functional class II dyspnea on exertion, but is quite sedentary.  She does not have any dyspnea at rest and frank orthopnea or PND, but she sleeps in a recliner.  Since her last visit, she had a spinal fracture and is under the care of Dr. Tonita Cong.  She has been much more sedentary due to this.  She reports good blood pressure control.  She describes a chronic cough productive of thick yellow-green mucus, not frothy mucus.  The cough does worsen when she lies down.  She currently does not smoke with does have a 40-pack-year history of smoking.  Interrogation of her pacemaker shows normal device function.  Her [employer] ingenio dual-chamber pacemaker was implanted in 2014 and has about 1.5 years of remaining longevity.  Presenting rhythm is AV sequential pacing.  She has 78% right atrial pacing (with lower rate limit set at 70 bpm) and with fair heart rate histograms.  Over the last year her ventricular pacing prevalence has steadily increased from 60% to about 90%, despite rhythmIQ.  Underlying rhythm is severe sinus bradycardia at about 40 bpm, with native AV conduction.  At atrial pacing rate of 45 bpm the AV delay is about 380 ms.  Tried to extend the AV delay as much as possible today including decreasing the low rate limit from 70 to 60 bpm  and decreasing the max track rate to 100 bpm, in order to extend the AV delay to 380 ms.  Even then, the rhythm was predominantly atrial paced, ventricular paced.  There are very rare episodes of high ventricular rate.  She has 4-7 beat runs of nonsustained VT on the average once every 2 months.  There has been no atrial fibrillation.  She reports good glycemic control on metformin monotherapy.  She also reports a good lipid profile on atorvastatin.   Unfortunately, do not have these values available for review   Past Medical History:  Diagnosis Date   Anxiety    Aortic valve stenosis 10/10/2016   Arthritis    "fingers" (07/24/2017)   CHF (congestive heart failure) (HCC)    COPD (chronic obstructive pulmonary disease) (HCC)    Depression    Gallstones    Heart murmur    High cholesterol    History of kidney stones    Hypertension    Hypothyroidism    Migraines    "none in years til 01/2013-02/2013; none since"  (07/24/2017)   Orthopnoea    "just since open heart surgery" (02/25/2013)   Pacemaker    [employer]    Pneumonia ~ 2012   "walking pneumonia"   Pneumonia 07/23/2017   Stroke ([place]) 2001   denies residual on 02/25/2013   Symptomatic hypotension 10/10/2016   Syncope and collapse    "prior to open heart surgery" (02/25/2013)   Type II diabetes mellitus ([place])     Past Surgical History:  Procedure Laterality Date   BREAST SURGERY Right    "clamped blood vessel and stitched it up; in [place]"   [place]  01/26/2013   "cow valve put in; widened the second valve" (02/25/2013) [place]   CATARACT EXTRACTION W/ INTRAOCULAR LENS  IMPLANT, BILATERAL Bilateral 04/2017   CHOLECYSTECTOMY OPEN  1971   CYST EXCISION     "taken off my chest"   INSERT / REPLACE / REMOVE PACEMAKER  01/19/2013   [place] RESECTION  2009   in [place]   VEIN SURGERY Left    LLE; cut leg open and took out piece of vein that was protruding thru skin; stitched it up; done in [place]    Current Medications: Outpatient Medications Prior to Visit  Medication Sig Dispense Refill   albuterol (PROVENTIL HFA;VENTOLIN HFA) 108 (90 BASE) MCG/ACT inhaler Inhale 2 puffs into the lungs every 6 (six) hours as needed for wheezing or shortness of breath. Use as directed -  With exercise     alendronate (FOSAMAX) 70 MG tablet Take 70 mg by mouth every 7  (seven) days. SUNDAYS     atorvastatin (LIPITOR) 40 MG tablet Take 40 mg by mouth daily.     cloNIDine (CATAPRES) 0.2 MG tablet Take 1 tablet (0.2 mg total) by mouth at bedtime. 30 tablet 11   ELIQUIS 5 MG TABS tablet Take 1 tablet (5 mg total) by mouth 2 (two) times daily. 180 tablet 1   ergocalciferol (VITAMIN D2) 1.25 MG (50000 UT) capsule Take 50,000 Units by mouth once a week.     Ferrous Sulfate (IRON) 325 (65 Fe) MG TABS Take 65 mg by mouth daily.  0   FLUoxetine (PROZAC) 40 MG capsule Take 40 mg by mouth daily.      fluticasone (FLONASE) 50 MCG/ACT nasal spray Place 1 spray into both nostrils daily as needed  for allergies.      gabapentin (NEURONTIN) 300 MG capsule Take 300 mg by mouth daily.     glipiZIDE (GLUCOTROL) 10 MG tablet Take 10 mg by mouth 2 (two) times daily before a meal.     glucose blood test strip 1 each by Other route. Use as instructed     HYDROcodone-acetaminophen (NORCO/VICODIN) 5-325 MG tablet Take 1 tablet by mouth every 6 (six) hours as needed for moderate pain or severe pain.     hydrocortisone 2.5 % cream Apply 1 application topically daily as needed (dry skin).      levothyroxine (SYNTHROID, LEVOTHROID) 50 MCG tablet Take 50 mcg by mouth daily before breakfast.     magnesium oxide (MAG-OX) 400 (241.3 Mg) MG tablet Take 1 tablet by mouth 2 (two) times daily.     metFORMIN (GLUCOPHAGE) 1000 MG tablet Take 500 mg by mouth every evening.     montelukast (SINGULAIR) 10 MG tablet Take 10 mg by mouth at bedtime.     nystatin ointment (MYCOSTATIN) Apply 1 application topically daily as needed (rash). 100,000- units/gram     olmesartan (BENICAR) 40 MG tablet Take 40 mg by mouth daily.     Prenatal Vit-Fe Fumarate-FA (PREPLUS) 27-1 MG TABS Take 1 tablet by mouth daily.  3   STIOLTO RESPIMAT 2.5-2.5 MCG/ACT AERS Inhale 2 puffs into the lungs daily.     SYMBICORT 160-4.5 MCG/ACT inhaler Inhale 2 puffs into the lungs in the morning and at bedtime.      temazepam (RESTORIL)  30 MG capsule Take 30 mg by mouth at bedtime.     traMADol (ULTRAM) 50 MG tablet Take 50 mg by mouth every 6 (six) hours as needed for moderate pain.      carvedilol (COREG) 12.5 MG tablet Take 1 tablet (12.5 mg total) 2 (two) times daily by mouth. 180 tablet 3   furosemide (LASIX) 40 MG tablet Take 40 mg by mouth daily.     No facility-administered medications prior to visit.     Allergies:   Patient has no known allergies.   Social History   Socioeconomic History   Marital status: Widowed    Spouse name: Not on file   Number of children: 2   Years of education: dental sch   Highest education level: Not on file  Occupational History   Occupation: retired  Tobacco Use   Smoking status: Former    Packs/day: 2.00    Years: 40.00    Pack years: 80.00    Types: Cigarettes    Quit date: 05/29/1999    Years since quitting: 21.8   Smokeless tobacco: Never  Vaping Use   Vaping Use: Never used  Substance and Sexual Activity   Alcohol use: No   Drug use: No   Sexual activity: Yes  Other Topics Concern   Not on file  Social History Narrative   Not on file   Social Determinants of Health   Financial Resource Strain: Not on file  Food Insecurity: Not on file  Transportation Needs: Not on file  Physical Activity: Not on file  Stress: Not on file  Social Connections: Not on file     Family History:  The patient's family history includes Breast cancer in her maternal aunt; Colon polyps in her brother and daughter; Diabetes in her maternal aunt and paternal aunt; Heart Problems in her father; Hypertension in her brother; Lung cancer in her mother; Skin cancer in her brother.   ROS:   Please  see the history of present illness.    ROS All other systems are reviewed and are negative.   PHYSICAL EXAM:   VS:  BP (!) 143/68   Pulse 70   Ht 5\' 1"  (1.549 m)   Wt 207 lb (93.9 kg)   SpO2 98%   BMI 39.11 kg/m      General: Alert, oriented x3, no distress, the left subclavian  pacemaker site is healthy Head: no evidence of trauma, PERRL, EOMI, no exophtalmos or lid lag, no myxedema, no xanthelasma; normal ears, nose and oropharynx Neck: normal jugular venous pulsations and no hepatojugular reflux; brisk carotid pulses without delay and no carotid bruits Chest: clear to auscultation, no signs of consolidation by percussion or palpation, normal fremitus, symmetrical and full respiratory excursions Cardiovascular: normal position and quality of the apical impulse, regular rhythm, normal first and second heart sounds, grade 3/6 aortic ejection murmur heard best at the right upper sternal border and a very distinct 3/6 decrescendo diastolic murmur heard up and down the sternal border on the right, no rubs or gallops Abdomen: no tenderness or distention, no masses by palpation, no abnormal pulsatility or arterial bruits, normal bowel sounds, no hepatosplenomegaly Extremities: no clubbing, cyanosis ; but there is 3-4+ hard pitting edema of the feet, ankles and pretibial area in a symmetrical pattern bilaterally.  On the on the lateral surface of the right lower extremity she has a large, palm size superficial venous stasis ulcer that appears to be healing.  There is no evidence of erythema or warmth.  On the left she has tiny little blisters, some of which have burst in the medial supramalleolar area. Neurological: grossly nonfocal Psych: Normal mood and affect    Wt Readings from Last 3 Encounters:  04/12/21 207 lb (93.9 kg)  02/28/21 199 lb (90.3 kg)  10/13/20 194 lb (88 kg)      Studies/Labs Reviewed:  ECHO 05/27/2019      1. Left ventricular ejection fraction, by visual estimation, is 55 to  60%. The left ventricle has normal function. There is no left ventricular  hypertrophy.   2. Elevated left ventricular end-diastolic pressure.   3. Left ventricular diastolic parameters are consistent with Grade I  diastolic dysfunction (impaired relaxation).   4. The left  ventricle has no regional wall motion abnormalities.   5. Global right ventricle has normal systolic function.The right  ventricular size is normal. No increase in right ventricular wall  thickness.   6. Left atrial size was mildly dilated.   7. Right atrial size was normal.   8. The mitral valve is normal in structure. Trivial mitral valve  regurgitation. No evidence of mitral stenosis.   9. The tricuspid valve is normal in structure.  10. The aortic valve bioprosthesis was not well visualized. 32mm Perimount  magna ease bioprosthetic aortic valve valve is present in the aortic  position. Aortic valve mean gradient measures 19.0 mmHg. Aortic valve peak  gradient measures 31.1 mmHg. Aortic   valve area, by VTI measures 0.81 cm. There is mild perivalvular aortic  insufficiency.  11. The pulmonic valve was normal in structure. Pulmonic valve  regurgitation is not visualized.  12. Mildly elevated pulmonary artery systolic pressure.  13. The inferior vena cava is normal in size with greater than 50%  respiratory variability, suggesting right atrial pressure of 3 mmHg.  14. Compared to study of 2019, the prosthetic AV mean gradient has  improved from 25 to 20mmHg. There remains mild perivalvular AI. PASP  has  now normalized compared to 55mmHg in 2019.   EKG:  EKG is not ordered today.  ECG from 02/28/2021 is personally reviewed and mostly shows AV sequential pacing, with some tract PACs that are also ventricularly paced.  ASSESSMENT:    1. Acute on chronic diastolic heart failure ([place])   2. S/P aortic valve replacement with bioprosthetic valve   3. Coronary artery disease involving native coronary artery of native heart without angina pectoris   4. Venous stasis ulcers of both lower extremities ([place])   5. SSS (sick sinus syndrome) (HCC)   6. Second degree atrioventricular block   7. Pacemaker   8. Nonrheumatic aortic (valve) insufficiency   9. Aortic prosthetic valve regurgitation,  subsequent encounter   10. Essential hypertension   11. Atherosclerosis of native coronary artery of native heart without angina pectoris   12. Atrial flutter with controlled response ([place])   13. Type 2 diabetes mellitus with stage 3b chronic kidney disease, without long-term current use of insulin ([place])   14. Hyperlipidemia LDL goal <70   15. Acquired hypothyroidism   16. Anemia, unspecified type      PLAN:  In order of problems listed above:  CHF: Mrs. Basden is clearly edematous and while there may be a component of peripheral venous insufficiency, she likely has true systemic volume overload.  Although she may have gained some true weight.  Is possible that she has 30 pounds of excess volume based on her weight trend.  Unfortunately, her creatinine is also worse than previous baseline.  It is possible that her worsening heart failure is due to an increased burden of ventricular pacing and efforts were made today to decrease this by reprogramming her pacemaker and changing her medications.  We will repeat an echocardiogram.  We will increase furosemide to 80 mg daily and repeat a basic metabolic panel and BNP level in about 10 days.  Instructed her to weigh daily and keep a log.   Venous stasis ulcer/peripheral venous insufficiency: Some of her lower extremity edema is also likely due to this.  Try to keep legs elevated as much as possible.  We will get her a prescription for compression stockings.  Discussed the fact that resolving her severe and persistent lower extremity edema is the only way to achieve healing of her venous stasis ulcers.  Keep legs elevated as much as possible.  Check a lower extremity venous duplex study for reflux.   SSS: She is not pacemaker dependent but does have severe underlying sinus bradycardia as well as a very long AV conduction time.  We will reduce her carvedilol dose to 6.25 mg twice daily. Second-degree AV block: Her pacemaker was initially implanted for  complete heart block immediately after aortic valve replacement, but she subsequently recovered AV conduction.  Currently has an extremely long first-degree AV block and only conducts at slow atrial rates. PPM: Normal device function, but with high burden of ventricular pacing.  Her device offers limited options for reprogramming to avoid ventricular pacing.  Reduce the lower rate limit to 60 bpm and reduce max track rate to 100 bpm, shortening PVARP, prolonged the sensed and  paced AV delay to 380 ms.  Remote downloads to be continued every 3 months, hopefully will show reduction in the burden of pacing.  Rhythm IQ is on.  If these measures are unsuccessful and LVEF is decreased, consider referral for pacemaker upgrade to a CRT device. S/p AVR: Longstanding perivalvular aortic insufficiency, present since the initial  surgery, mild by last assessment.  Recheck echocardiogram.  Endocarditis prophylaxis is appropriate with dental procedures. History of aortic root replacement: She has declined imaging follow-up since she would never consider having repeat surgery. HTN: Well-controlled Coronary atherosclerosis was noted in the LAD and the right coronary artery on CT scan from November 2019.  Has never had clinically relevant CAD or any angina pectoris. Aflutter: Successfully underwent overdrive pacing in April 2021.  Had recurrence of the arrhythmia in January, but the flutter resolved spontaneously.  It has not recurred since then and antiarrhythmics do not appear indicated.  On appropriate Eliquis anticoagulation.  No bleeding problems.  CHA2DS2-VASc 7 (age 4, gender, HTN, CHF, DM, CAD). DM: Reports good glycemic control.  I do not have recent labs for review.  Consider replacing glipizide with Vania Rea or Farxiga for heart failure benefit.  Would like to see the superficial skin lesions on her legs heal first. HLP: Compliant with statin.  Reports good lipid profile but do not have this available for  review. COPD: She has a chronic cough productive of greenish and yellow sputum that is thick and mucousy, sounds more like chronic bronchitis than heart failure fluid.  She is receiving Stiolto and Symbicort daily as well as albuterol as needed. Hypothyroidism: On levothyroxine supplementation.  No recent TSH available.  Clinically euthyroid. Anemia: mild, normocytic normochromic, etiology uncertain.  Hemoglobin was 9.7 in March 2021   Medication Adjustments/Labs and Tests Ordered: Current medicines are reviewed at length with the patient today.  Concerns regarding medicines are outlined above.  Medication changes, Labs and Tests ordered today are listed in the Patient Instructions below. Patient Instructions  Medication Instructions:  DECREASE the Carvedilol to 6.25 mg twice daily  INCREASE the Furosemide to 80 mg once daily  *If you need a refill on your cardiac medications before your next appointment, please call your pharmacy*   Lab Work: Your provider would like for you to return in 10 days to have the following labs drawn: BNP and BMET. You do not need an appointment for the lab. Once in our office lobby there is a podium where you can sign in and ring the doorbell to alert [place] that you are here. The lab is open from 8:00 am to 4:30 pm; closed for lunch from 12:45pm-1:45pm.  If you have labs (blood work) drawn today and your tests are completely normal, you will receive your results only by: [facility] (if you have MyChart) OR A paper copy in the mail If you have any lab test that is abnormal or we need to change your treatment, we will call you to review the results.   Testing/Procedures: Your physician has requested that you have an echocardiogram. Echocardiography is a painless test that uses sound waves to create images of your heart. It provides your doctor with information about the size and shape of your heart and how well your heart's chambers and valves are working. You  may receive an ultrasound enhancing agent through an IV if needed to better visualize your heart during the echo.This procedure takes approximately one hour. There are no restrictions for this procedure. This will take place at the [address].   Your physician has requested that you have a lower extremity venous duplex. This test is an ultrasound of the veins in the legs. It looks at venous blood flow that carries blood from the heart to the legs or arms. Allow one hour for a Lower Venous exam. There  are no restrictions or special instructions.  Follow-Up: At [facility], you and your health needs are our priority.  As part of our continuing mission to provide you with exceptional heart care, we have created designated Provider Care Teams.  These Care Teams include your primary Cardiologist (physician) and Advanced Practice Providers (APPs -  Physician Assistants and Nurse Practitioners) who all work together to provide you with the care you need, when you need it.  We recommend signing up for the patient portal called "MyChart".  Sign up information is provided on this After Visit Summary.  MyChart is used to connect with patients for Virtual Visits (Telemedicine).  Patients are able to view lab/test results, encounter notes, upcoming appointments, etc.  Non-urgent messages can be sent to your provider as well.   To learn more about what you can do with MyChart, go to [email or web address].    Your next appointment:   2 month(s) on a device day  The format for your next appointment:   In Person  Provider:   Sanda Klein, MD    Signed, Sanda Klein, MD  04/13/2021 9:40 PM    [address], [place], [place]  [address] Phone: [phone]; Fax: [phone]

## 2021-04-13 ENCOUNTER — Telehealth: Payer: Self-pay | Admitting: Cardiovascular Disease

## 2021-04-13 ENCOUNTER — Encounter: Payer: Self-pay | Admitting: Cardiovascular Disease

## 2021-04-13 DIAGNOSIS — I4892 Unspecified atrial flutter: Secondary | ICD-10-CM | POA: Insufficient documentation

## 2021-04-13 DIAGNOSIS — T82897A Other specified complication of cardiac prosthetic devices, implants and grafts, initial encounter: Secondary | ICD-10-CM | POA: Insufficient documentation

## 2021-04-13 DIAGNOSIS — I83019 Varicose veins of right lower extremity with ulcer of unspecified site: Secondary | ICD-10-CM | POA: Insufficient documentation

## 2021-04-13 DIAGNOSIS — I83029 Varicose veins of left lower extremity with ulcer of unspecified site: Secondary | ICD-10-CM | POA: Insufficient documentation

## 2021-04-13 DIAGNOSIS — I441 Atrioventricular block, second degree: Secondary | ICD-10-CM | POA: Insufficient documentation

## 2021-04-13 NOTE — Telephone Encounter (Signed)
Patient states that Dr. Sallyanne Kuster was to write her a prescription for compression socks but she thinks he forgot. She wants to know if he can send that prescription in now to the [facility] in [place]. She's not sure the name of it but states that Dr. Sallyanne Kuster and [name] do. She wants to know if this can be sent today so that her and her daughter can go get her fitted. Please advise.

## 2021-04-13 NOTE — Telephone Encounter (Signed)
Please advise 

## 2021-04-14 NOTE — Progress Notes (Signed)
Thanks for seeing her - that sounds like a complex visit.  Will follow-up on her studies as well.  -Mali

## 2021-04-14 NOTE — Telephone Encounter (Signed)
Patient has been made aware that the prescription has been sent in for her to [facility].

## 2021-04-17 NOTE — Telephone Encounter (Signed)
Routed to [employer] for assistance with order

## 2021-04-17 NOTE — Telephone Encounter (Signed)
Pt called to ask if the prescription for 2 pairs of Compression Socks can be emailed to her daughter at [email or web address] so the daughter can pick up the compression socks in [place] instead of [place].

## 2021-04-18 NOTE — Telephone Encounter (Signed)
Returned the call to the patient. She stated that she went and bought stockings and did not need the prescription.

## 2021-04-19 LAB — BASIC METABOLIC PANEL
BUN/Creatinine Ratio: 21 (ref 12–28)
BUN: 22 mg/dL (ref 8–27)
CO2: 27 mmol/L (ref 20–29)
Calcium: 8.5 mg/dL — ABNORMAL LOW (ref 8.7–10.3)
Chloride: 104 mmol/L (ref 96–106)
Creatinine, Ser: 1.05 mg/dL — ABNORMAL HIGH (ref 0.57–1.00)
Glucose: 129 mg/dL — ABNORMAL HIGH (ref 70–99)
Potassium: 4.1 mmol/L (ref 3.5–5.2)
Sodium: 145 mmol/L — ABNORMAL HIGH (ref 134–144)
eGFR: 55 mL/min/{1.73_m2} — ABNORMAL LOW (ref >59)

## 2021-04-19 LAB — BRAIN NATRIURETIC PEPTIDE: BNP: 329.9 pg/mL — ABNORMAL HIGH (ref 0.0–100.0)

## 2021-04-20 NOTE — Progress Notes (Signed)
Remote pacemaker transmission.   

## 2021-04-21 ENCOUNTER — Ambulatory Visit (HOSPITAL_COMMUNITY)
Admission: RE | Admit: 2021-04-21 | Discharge: 2021-04-21 | Disposition: A | Discharge status: Home / Self Care | Payer: Medicare Other | Source: Ambulatory Visit | Attending: Cardiovascular Disease | Admitting: Cardiovascular Disease

## 2021-04-21 ENCOUNTER — Other Ambulatory Visit: Payer: Self-pay

## 2021-04-21 DIAGNOSIS — L97928 Non-pressure chronic ulcer of unspecified part of left lower leg with other specified severity: Secondary | ICD-10-CM

## 2021-04-21 DIAGNOSIS — I83019 Varicose veins of right lower extremity with ulcer of unspecified site: Secondary | ICD-10-CM | POA: Insufficient documentation

## 2021-04-21 DIAGNOSIS — L97929 Non-pressure chronic ulcer of unspecified part of left lower leg with unspecified severity: Secondary | ICD-10-CM | POA: Insufficient documentation

## 2021-04-21 DIAGNOSIS — I83012 Varicose veins of right lower extremity with ulcer of calf: Secondary | ICD-10-CM | POA: Diagnosis not present

## 2021-04-21 DIAGNOSIS — L97918 Non-pressure chronic ulcer of unspecified part of right lower leg with other specified severity: Secondary | ICD-10-CM

## 2021-04-21 DIAGNOSIS — L97919 Non-pressure chronic ulcer of unspecified part of right lower leg with unspecified severity: Secondary | ICD-10-CM | POA: Diagnosis present

## 2021-04-21 DIAGNOSIS — I83029 Varicose veins of left lower extremity with ulcer of unspecified site: Secondary | ICD-10-CM | POA: Insufficient documentation

## 2021-05-02 ENCOUNTER — Other Ambulatory Visit: Payer: Self-pay

## 2021-05-02 ENCOUNTER — Telehealth: Payer: Self-pay | Admitting: Cardiovascular Disease

## 2021-05-02 ENCOUNTER — Ambulatory Visit (HOSPITAL_COMMUNITY): Payer: Medicare Other | Attending: Cardiovascular Disease

## 2021-05-02 DIAGNOSIS — Z01812 Encounter for preprocedural laboratory examination: Secondary | ICD-10-CM

## 2021-05-02 DIAGNOSIS — I340 Nonrheumatic mitral (valve) insufficiency: Secondary | ICD-10-CM

## 2021-05-02 DIAGNOSIS — Z953 Presence of xenogenic heart valve: Secondary | ICD-10-CM | POA: Diagnosis not present

## 2021-05-02 LAB — ECHOCARDIOGRAM COMPLETE
AV Mean grad: 21 mmHg
AV Peak grad: 36.2 mmHg
Ao pk vel: 3.01 m/s
Area-P 1/2: 4.89 cm2
MV M vel: 5.98 m/s
MV Peak grad: 143 mmHg
P 1/2 time: 317 msec
Radius: 0.7 cm
S' Lateral: 3.2 cm

## 2021-05-02 NOTE — Telephone Encounter (Signed)
Tonya Glass is calling stating she is still having congestion since the last time she saw Dr. Loletha Grayer. She reports it is in her chest, nasal passages, and she is also coughing up phlegm off and on. She is requesting something be prescribed for her in regards to this and advised her NP was treating this as allergies which did not give her any relief.

## 2021-05-02 NOTE — Telephone Encounter (Signed)
Spoke with patient of Dr.C who reports congestion x3 months. Her PCP (NP) said it is allergies. She reports her nasal passages are not opening, she is producing phlegm, she feels bubbles/phlegm when she breathes in and out. No runny nose. No fever. She is coughing up plegem, which is thick and muscousy-looking -- this has improved from her last visit with Dr.C She has tried an OTC cough med with no relief. She reports sleeping in the recliner (also has back issues). She has COPD and these meds have not helped. She had her echo done today.  She would like to try Trelegy instead of Stiolto -- advised she may need to discuss with pulmonologist if she has one  Advised will send to Dr.C to review -- last saw her on 11/30

## 2021-05-03 ENCOUNTER — Telehealth: Payer: Self-pay | Admitting: Cardiovascular Disease

## 2021-05-03 NOTE — Telephone Encounter (Signed)
Returned call to pt informed pt to call PCP

## 2021-05-03 NOTE — Telephone Encounter (Signed)
Patient is requesting an order for a drive walker with seat. Please assist.

## 2021-05-04 NOTE — Telephone Encounter (Signed)
Patient is following up regarding congestion and nasal symptoms. She would like to know if something can be called into [facility]  ([place], [place]). She would also like to go over her echo results. Please advise.

## 2021-05-04 NOTE — Telephone Encounter (Signed)
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 This could explain the worsening heart failure. We should schedule her for a TEE. As far as switching from Stiolto to Trelegy, I have no problem with that, but I noticed that Symbicort is also on her list. There is overlap between the ingredients in Stiolto and Symbicort and she should not be taking both of those at the same time. I usually leave the decision re: inhalers up to the pulmonary specialist. Has she tried coricidin HBP for the upper airway congestion problems?  Pt notified of above. She will await call from scheduling.

## 2021-05-05 ENCOUNTER — Encounter: Payer: Self-pay | Admitting: *Deleted

## 2021-05-05 NOTE — Telephone Encounter (Signed)
Called patient with TEE instructions. They will be mailed as well.   The patient had wanted Dr. Sallyanne Kuster to prescribe Trelegy. She has been advised that this will need to be prescribed by her PCP or pulmonary provider.   You are scheduled for a TEE on 06/13/2021 with Dr. Sallyanne Kuster.  Please arrive at the [facility] (Main Entrance A) at [facility]: [address] [place], [place] [address] at 8 am. (1 hour prior to procedure unless lab work is needed)  DIET: Nothing to eat or drink after midnight except a sip of water with medications (see medication instructions below)  FYI: For your safety, and to allow [place] to monitor your vital signs accurately during the surgery/procedure we request that   if you have artificial nails, gel coating, SNS etc. Please have those removed prior to your surgery/procedure. Not having the nail coverings /polish removed may result in cancellation or delay of your surgery/procedure.   Medication Instructions: Hold the glipizide the morning of the procedure. Hold the Furosemide the morning of the procedure  Labs:  Your provider would like for you to return on January 24 or 25th to have the following labs drawn: CBC and BMET. You do not need an appointment for the lab. Once in our office lobby there is a podium where you can sign in and ring the doorbell to alert [place] that you are here. The lab is open from 8:00 am to 4:15 pm; closed for lunch from 12:45pm-1:45pm.  You must have a responsible person to drive you home and stay in the waiting area during your procedure. Failure to do so could result in cancellation.  Bring your insurance cards.  *Special Note: Every effort is made to have your procedure done on time. Occasionally there are emergencies that occur at the hospital that may cause delays. Please be patient if a delay does occur.

## 2021-05-17 ENCOUNTER — Telehealth: Payer: Self-pay | Admitting: Internal Medicine

## 2021-05-17 MED ORDER — AMLODIPINE BESYLATE 2.5 MG PO TABS: 2.5000 mg | ORAL_TABLET | Freq: Every day | ORAL | 3 refills | Status: DC

## 2021-05-17 MED ORDER — AMLODIPINE BESYLATE 2.5 MG PO TABS: ORAL_TABLET | ORAL | 3 refills | Status: DC

## 2021-05-17 NOTE — Telephone Encounter (Signed)
Her medicine is likely running out by the morning. Dr. 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 She takes clonidine at night, but this is short-acting. Benicar in the am, but maxed out on dose. May need to go back up on PM coreg or add another agent at night, such as amlodipine. Could try adding amlodipine 2.5 mg QHS.  Dr Lemmie Evens

## 2021-05-17 NOTE — Telephone Encounter (Signed)
Pt c/o BP issue: STAT if pt c/o blurred vision, one-sided weakness or slurred speech  1. What are your last 5 BP readings?   01/03: 138/68 (last night) 01/04: 177/76 (this morning)  2. Are you having any other symptoms (ex. Dizziness, headache, blurred vision, passed out)?  Headaches   3. What is your BP issue?   Patient states she noticed her BP has been elevated in the morning recently. She doesn't have additional readings, but she would like to know whether this is normal. She states she also woke up with a headache, which has now subsided.

## 2021-05-17 NOTE — Telephone Encounter (Signed)
Spoke to patient she stated for the past 3 weeks she has noticed her B/P has been elevated.B/P ranging 177/76,133/63,181,177,163. Pulse ranging in 90's.Medications reviewed medication list is correct.Stated she is scheduled for a TEE 06/13/21.I will send message to Dr.Hilty for advice.

## 2021-05-17 NOTE — Telephone Encounter (Signed)
Spoke to patient Dr.Hilty advised to start Amlodipine 2.5 mg every night.Advised to continue all other medications.Advised continue to monitor B/P and call back if continues to be elevated.

## 2021-05-22 NOTE — Telephone Encounter (Signed)
° °  Pt c/o BP issue: STAT if pt c/o blurred vision, one-sided weakness or slurred speech  1. What are your last 5 BP readings? 185/85  2. Are you having any other symptoms (ex. Dizziness, headache, blurred vision, passed out)? no  3. What is your BP issue? Patient started the new medication Dr. Debara Pickett put her on Friday night but her BP is still high. She wanted to know how long it would take for the medication to bring her BP down

## 2021-05-22 NOTE — Telephone Encounter (Signed)
Called patient. She states her blood pressure is usually high in the morning and lower in the afternoon (165 sbp). Pt denies headaches, blurred vision, dizziness and any other symptoms. Pt took all her mediation this morning. Pt made aware medications can take some time to start to work properly. Also if she starts to experience chest pain, SOB without relief we recommend she goes to the Emergency Department. She verbalized understanding. She states she wants Dr. 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 Will forward message to Dr. Debara Pickett in regards to the blood pressure medication.

## 2021-05-23 NOTE — Telephone Encounter (Signed)
Called pt and let her know Dr. Lurline Del recommendations. She verbalized understanding and thanked me for calling her. No further questions/concerns at this time.

## 2021-06-05 ENCOUNTER — Telehealth: Payer: Self-pay | Admitting: Cardiovascular Disease

## 2021-06-05 ENCOUNTER — Encounter (HOSPITAL_COMMUNITY): Payer: Self-pay | Admitting: Cardiovascular Disease

## 2021-06-05 NOTE — Progress Notes (Signed)
Attempted to obtain medical history via telephone, unable to reach at this time. I left a voicemail to return pre surgical testing department's phone call.  

## 2021-06-05 NOTE — Telephone Encounter (Signed)
Patient is requesting a new prescription for [facility] in [place]. She does not know the name of the place, but states Dr. Sallyanne Kuster will know. Patient is hopeful for written prescriptions that she can pick up from the office. Please advise.

## 2021-06-06 LAB — CBC
Hematocrit: 35.8 % (ref 34.0–46.6)
Hemoglobin: 11.3 g/dL (ref 11.1–15.9)
MCH: 29.7 pg (ref 26.6–33.0)
MCHC: 31.6 g/dL (ref 31.5–35.7)
MCV: 94 fL (ref 79–97)
Platelets: 196 10*3/uL (ref 150–450)
RBC: 3.8 x10E6/uL (ref 3.77–5.28)
RDW: 13.2 % (ref 11.7–15.4)
WBC: 7.6 10*3/uL (ref 3.4–10.8)

## 2021-06-06 LAB — BASIC METABOLIC PANEL
BUN/Creatinine Ratio: 18 (ref 12–28)
BUN: 22 mg/dL (ref 8–27)
CO2: 26 mmol/L (ref 20–29)
Calcium: 8.8 mg/dL (ref 8.7–10.3)
Chloride: 103 mmol/L (ref 96–106)
Creatinine, Ser: 1.25 mg/dL — ABNORMAL HIGH (ref 0.57–1.00)
Glucose: 151 mg/dL — ABNORMAL HIGH (ref 70–99)
Potassium: 4.1 mmol/L (ref 3.5–5.2)
Sodium: 144 mmol/L (ref 134–144)
eGFR: 45 mL/min/{1.73_m2} — ABNORMAL LOW (ref >59)

## 2021-06-06 NOTE — Telephone Encounter (Signed)
The patient called and asked for a new prescription for compression stockings. The ones she recently got do not fit right. She wants to go to the store in [place]. She would also like a prescription for the "contraption that goes with it to help put the stockings on."

## 2021-06-07 ENCOUNTER — Encounter: Payer: Self-pay | Admitting: *Deleted

## 2021-06-09 ENCOUNTER — Telehealth: Payer: Self-pay | Admitting: Cardiovascular Disease

## 2021-06-09 NOTE — Telephone Encounter (Signed)
Pt c/o BP issue: STAT if pt c/o blurred vision, one-sided weakness or slurred speech  1. What are your last 5 BP readings? 169/71; 162/79 64; 159/72 66/; 140/70 68; 165/77 177/86 83  2. Are you having any other symptoms (ex. Dizziness, headache, blurred vision, passed out)? Slight headache                  3. What is your BP issue? high

## 2021-06-09 NOTE — Telephone Encounter (Unsigned)
Returned the call to the patient. She stated that she has been having high blood pressures lately. She stated that she checks her blood pressure every morning when she wakes up before she takes her medications.   Previous readings which were all done pre medication: 1/23 169/71 1/24 162/79 64 1/25 159/72 66 1/26140/70 68   Medications: Amlodipine 2.5 morning Carvedilol 6.25 mg bid Clonidine 0.2 mg bedtime  She stated that this morning it was 177/77 62. Blood pressure while on the phone was 118/53  and heart rate was 60. She has been been advised to check her blood pressure 1-2 hours after taking her morning medications. Keep a log of these and call back if she sees that her blood pressure is staying elevated.

## 2021-06-12 ENCOUNTER — Other Ambulatory Visit: Payer: Self-pay | Admitting: *Deleted

## 2021-06-12 DIAGNOSIS — I340 Nonrheumatic mitral (valve) insufficiency: Secondary | ICD-10-CM

## 2021-06-12 NOTE — Telephone Encounter (Signed)
Prescription has been faxed again to [facility]. Patient aware.

## 2021-06-13 ENCOUNTER — Encounter (HOSPITAL_COMMUNITY)
Admission: RE | Disposition: A | Discharge status: Home / Self Care | Payer: Self-pay | Source: Home / Self Care | Attending: Cardiovascular Disease

## 2021-06-13 ENCOUNTER — Ambulatory Visit (HOSPITAL_COMMUNITY): Payer: Medicare Other | Admitting: Certified Registered"

## 2021-06-13 ENCOUNTER — Ambulatory Visit (HOSPITAL_COMMUNITY)
Admission: RE | Admit: 2021-06-13 | Discharge: 2021-06-13 | Disposition: A | Discharge status: Home / Self Care | Payer: Medicare Other | Attending: Cardiovascular Disease | Admitting: Cardiovascular Disease

## 2021-06-13 ENCOUNTER — Encounter (HOSPITAL_COMMUNITY): Payer: Self-pay | Admitting: Cardiovascular Disease

## 2021-06-13 ENCOUNTER — Ambulatory Visit (HOSPITAL_BASED_OUTPATIENT_CLINIC_OR_DEPARTMENT_OTHER)
Admission: RE | Admit: 2021-06-13 | Discharge: 2021-06-13 | Disposition: A | Discharge status: Home / Self Care | Payer: Medicare Other | Source: Ambulatory Visit | Attending: Cardiovascular Disease | Admitting: Cardiovascular Disease

## 2021-06-13 ENCOUNTER — Other Ambulatory Visit: Payer: Self-pay

## 2021-06-13 DIAGNOSIS — I083 Combined rheumatic disorders of mitral, aortic and tricuspid valves: Secondary | ICD-10-CM | POA: Diagnosis present

## 2021-06-13 DIAGNOSIS — Z953 Presence of xenogenic heart valve: Secondary | ICD-10-CM | POA: Insufficient documentation

## 2021-06-13 DIAGNOSIS — Z7901 Long term (current) use of anticoagulants: Secondary | ICD-10-CM | POA: Diagnosis not present

## 2021-06-13 DIAGNOSIS — I495 Sick sinus syndrome: Secondary | ICD-10-CM | POA: Diagnosis not present

## 2021-06-13 DIAGNOSIS — I5032 Chronic diastolic (congestive) heart failure: Secondary | ICD-10-CM | POA: Diagnosis not present

## 2021-06-13 DIAGNOSIS — I340 Nonrheumatic mitral (valve) insufficiency: Secondary | ICD-10-CM

## 2021-06-13 DIAGNOSIS — I5082 Biventricular heart failure: Secondary | ICD-10-CM | POA: Insufficient documentation

## 2021-06-13 DIAGNOSIS — I2510 Atherosclerotic heart disease of native coronary artery without angina pectoris: Secondary | ICD-10-CM | POA: Diagnosis not present

## 2021-06-13 DIAGNOSIS — E119 Type 2 diabetes mellitus without complications: Secondary | ICD-10-CM | POA: Insufficient documentation

## 2021-06-13 DIAGNOSIS — Z7984 Long term (current) use of oral hypoglycemic drugs: Secondary | ICD-10-CM | POA: Insufficient documentation

## 2021-06-13 DIAGNOSIS — D6859 Other primary thrombophilia: Secondary | ICD-10-CM | POA: Insufficient documentation

## 2021-06-13 DIAGNOSIS — I110 Hypertensive heart disease with heart failure: Secondary | ICD-10-CM | POA: Diagnosis not present

## 2021-06-13 DIAGNOSIS — Z950 Presence of cardiac pacemaker: Secondary | ICD-10-CM | POA: Diagnosis not present

## 2021-06-13 DIAGNOSIS — E785 Hyperlipidemia, unspecified: Secondary | ICD-10-CM | POA: Diagnosis not present

## 2021-06-13 DIAGNOSIS — I351 Nonrheumatic aortic (valve) insufficiency: Secondary | ICD-10-CM

## 2021-06-13 DIAGNOSIS — Z87891 Personal history of nicotine dependence: Secondary | ICD-10-CM | POA: Insufficient documentation

## 2021-06-13 DIAGNOSIS — D649 Anemia, unspecified: Secondary | ICD-10-CM | POA: Diagnosis not present

## 2021-06-13 HISTORY — PX: TEE WITHOUT CARDIOVERSION: SHX5443

## 2021-06-13 LAB — ECHO TEE
AV Mean grad: 13.9 mmHg
AV Peak grad: 29.2 mmHg
Ao pk vel: 2.7 m/s
Area-P 1/2: 4.15 cm2
MV M vel: 5.67 m/s
MV Peak grad: 128.6 mmHg
P 1/2 time: 394 msec
Radius: 0.7 cm

## 2021-06-13 LAB — GLUCOSE, CAPILLARY
Glucose-Capillary: 112 mg/dL — ABNORMAL HIGH (ref 70–99)
Glucose-Capillary: 127 mg/dL — ABNORMAL HIGH (ref 70–99)

## 2021-06-13 SURGERY — ECHOCARDIOGRAM, TRANSESOPHAGEAL
Anesthesia: Monitor Anesthesia Care

## 2021-06-13 MED ORDER — FUROSEMIDE 40 MG PO TABS: ORAL_TABLET | ORAL | 11 refills | Status: DC

## 2021-06-13 MED ORDER — PROPOFOL 10 MG/ML IV BOLUS
INTRAVENOUS | Status: DC | PRN
Start: 2021-06-13 — End: 2021-06-13
  Administered 2021-06-13: 10 mg via INTRAVENOUS

## 2021-06-13 MED ORDER — BUTAMBEN-TETRACAINE-BENZOCAINE 2-2-14 % EX AERO
INHALATION_SPRAY | CUTANEOUS | Status: DC | PRN
  Administered 2021-06-13: 2 via TOPICAL

## 2021-06-13 MED ORDER — GLYCOPYRROLATE 0.2 MG/ML IJ SOLN
INTRAMUSCULAR | Status: DC | PRN
  Administered 2021-06-13: .1 mg via INTRAVENOUS

## 2021-06-13 MED ORDER — PROPOFOL 500 MG/50ML IV EMUL
INTRAVENOUS | Status: DC | PRN
  Administered 2021-06-13: 125 ug/kg/min via INTRAVENOUS

## 2021-06-13 MED ORDER — SODIUM CHLORIDE 0.9 % IV SOLN: INTRAVENOUS | Status: DC

## 2021-06-13 MED ORDER — LIDOCAINE HCL (CARDIAC) PF 100 MG/5ML IV SOSY
PREFILLED_SYRINGE | INTRAVENOUS | Status: DC | PRN
  Administered 2021-06-13: 60 mg via INTRAVENOUS

## 2021-06-13 NOTE — Anesthesia Postprocedure Evaluation (Signed)
Anesthesia Post Note  Patient: Tonya Glass  Procedure(s) Performed: TRANSESOPHAGEAL ECHOCARDIOGRAM (TEE)     Patient location during evaluation: PACU Anesthesia Type: MAC Level of consciousness: awake and alert, patient cooperative and oriented Pain management: pain level controlled Vital Signs Assessment: post-procedure vital signs reviewed and stable Respiratory status: spontaneous breathing, nonlabored ventilation and respiratory function stable Cardiovascular status: stable and blood pressure returned to baseline Postop Assessment: no apparent nausea or vomiting Anesthetic complications: no   No notable events documented.  Last Vitals:  Vitals:   06/13/21 1005 06/13/21 1020  BP: (!) 145/54 (!) 163/59  Pulse: (!) 59 60  Resp: 13 16  Temp:  36.4 C  SpO2: 98% 95%    Last Pain:  Vitals:   06/13/21 1020  TempSrc:   PainSc: 0-No pain                 Zitlaly Malson,E. Marzella Miracle

## 2021-06-13 NOTE — Transfer of Care (Signed)
Immediate Anesthesia Transfer of Care Note  Patient: Tonya Glass  Procedure(s) Performed: TRANSESOPHAGEAL ECHOCARDIOGRAM (TEE)  Patient Location: PACU  Anesthesia Type:MAC  Level of Consciousness: drowsy and patient cooperative  Airway & Oxygen Therapy: Patient Spontanous Breathing  Post-op Assessment: Report given to RN and Post -op Vital signs reviewed and stable  Post vital signs: Reviewed and stable  Last Vitals:  Vitals Value Taken Time  BP 121/45 06/13/21 0947  Temp    Pulse 59 06/13/21 0948  Resp 17 06/13/21 0948  SpO2 94 % 06/13/21 0948  Vitals shown include unvalidated device data.  Last Pain:  Vitals:   06/13/21 0824  TempSrc: Temporal  PainSc: 0-No pain         Complications: No notable events documented.

## 2021-06-13 NOTE — Op Note (Signed)
INDICATIONS: Mitral and aortic insufficiency  PROCEDURE:   Informed consent was obtained prior to the procedure. The risks, benefits and alternatives for the procedure were discussed and the patient comprehended these risks.  Risks include, but are not limited to, cough, sore throat, vomiting, nausea, somnolence, esophageal and stomach trauma or perforation, bleeding, low blood pressure, aspiration, pneumonia, infection, trauma to the teeth and death.    After a procedural time-out, the oropharynx was anesthetized with 20% benzocaine spray.   During this procedure the patient was administered IV propofol by Anesthesiology  The transesophageal probe was inserted in the esophagus and stomach without difficulty and multiple views were obtained.  The patient was kept under observation until the patient left the procedure room.  The patient left the procedure room in stable condition.   Agitated microbubble saline contrast was not administered.  COMPLICATIONS:    There were no immediate complications.  FINDINGS:  Normal LV function. AVR with moderate perivalvular leak. There appear to be two separate jets. There is also trivial intravalvular leak Thickened and restricted mitral leaflets with mild-moderate MR. No mitral stenosis. Pseudonormal mitral inflow and diastolic dominant pulmonary vein flow, elevated mean left atrial pressure. Tricuspid regurgitation, at least moderate. No atrial septal shunting. Tortuous descending aorta with ulcerated plaque.  RECOMMENDATIONS:     Increase diuretics. Consider CT of the AVR to assess perivalvular leaks.  Time Spent Directly with the Patient:  45 minutes   Opal Mckellips 06/13/2021, 9:51 AM

## 2021-06-13 NOTE — H&P (Signed)
Cardiology Admission History and Physical:   Patient ID: Tonya Glass MRN: [id number]; DOB: 06-01-1945   Admission date: 06/13/2021  PCP:  Tonya Side., [place] Providers Cardiologist:  Tonya Casino, MD        Chief Complaint:  Mitral insufficiency  Patient Profile:   Tonya Glass is a 76 y.o. female with history of aortic valve replacement with biological prosthesis and congestive heart failure with preserved ejection fraction who is being seen 06/13/2021 for the evaluation of mitral insufficiency.  History of Present Illness:   Ms. 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  She has a history of paroxysmal atrial flutter that has been successfully treated in the past with overdrive pacing.  She had minor CAD by angiography in 2014 (40% mid LAD).  She has known mild perivalvular aortic insufficiency ever since device implantation.  Follow-up echocardiogram performed for worsening heart failure symptoms appears to show severe mitral insufficiency, so she is here today for transesophageal echo.  Additional medical problems include well-controlled type 2 diabetes mellitus and hyperlipidemia on statin therapy.  Over the last several months she developed NYHA functional class II exertional dyspnea and worsening lower extremity edema complicated by venous stasis ulcers on the right supramalleolar area and weight gain of as much as 20-30 pounds.  She has not had frank orthopnea or PND (but she sleeps in a recliner for years) and denies angina.  She has not had palpitations.  She denies syncope.  She has a history of spinal fracture which has made her very sedentary.  Most recent pacemaker interrogation shows  normal device function with 78% atrial pacing an increase in ventricular pacing to about 90%.  Underlying rhythm was severe sinus bradycardia with 1: 1 AV conduction at 40 bpm.  The AV delay is extremely long.  There has been no atrial fibrillation.  Labs have shown moderately elevated BNP and worsening renal function.   Past Medical History:  Diagnosis Date   Anxiety    Aortic valve stenosis 10/10/2016   Arthritis    "fingers" (07/24/2017)   CHF (congestive heart failure) (HCC)    COPD (chronic obstructive pulmonary disease) (HCC)    Depression    Gallstones    Heart murmur    High cholesterol    History of kidney stones    Hypertension    Hypothyroidism    Migraines    "none in years til 01/2013-02/2013; none since"  (07/24/2017)   Orthopnoea    "just since open heart surgery" (02/25/2013)   Pacemaker    [employer]    Pneumonia ~ 2012   "walking pneumonia"   Pneumonia 07/23/2017   Stroke ([place]) 2001   denies residual on 02/25/2013   Symptomatic hypotension 10/10/2016   Syncope and collapse    "prior to open heart surgery" (02/25/2013)   Type II diabetes mellitus ([place])     Past Surgical History:  Procedure Laterality Date   BREAST SURGERY Right    "clamped blood vessel and stitched it up; in [place]"   [place]  01/26/2013   "cow valve put in; widened the second valve" (02/25/2013) - [place], [place]   CATARACT EXTRACTION W/ INTRAOCULAR LENS  IMPLANT, BILATERAL Bilateral 04/2017  CHOLECYSTECTOMY OPEN  1971   CYST EXCISION     "taken off my chest"   INSERT / REPLACE / REMOVE PACEMAKER  01/19/2013   [employer] - dual chamber   LAPAROSCOPIC GASTRIC SLEEVE RESECTION  2009   in [place]   VEIN SURGERY Left    LLE; cut leg open and took out piece of vein that was protruding thru skin; stitched it up; done in [place]     Medications Prior to Admission: Prior to Admission medications   Medication Sig Start Date End Date  Taking? Authorizing Provider  albuterol (PROVENTIL HFA;VENTOLIN HFA) 108 (90 BASE) MCG/ACT inhaler Inhale 2 puffs into the lungs every 6 (six) hours as needed for wheezing or shortness of breath. Use as directed -  With exercise 12/08/13  Yes [provider]  alendronate (FOSAMAX) 70 MG tablet Take 70 mg by mouth every 7 (seven) days. SUNDAYS 07/13/17  Yes [provider]  amLODipine (NORVASC) 2.5 MG tablet Take 2.5 mg every night 05/17/21  Yes Glass, Tonya Corwin, MD  atorvastatin (LIPITOR) 40 MG tablet Take 40 mg by mouth daily.   Yes [provider]  carvedilol (COREG) 6.25 MG tablet Take 1 tablet (6.25 mg total) by mouth 2 (two) times daily. 04/12/21  Yes Tonya Digirolamo, MD  cloNIDine (CATAPRES) 0.2 MG tablet Take 1 tablet (0.2 mg total) by mouth at bedtime. 09/18/17  Yes Glass, Tonya Corwin, MD  ELIQUIS 5 MG TABS tablet Take 1 tablet (5 mg total) by mouth 2 (two) times daily. 03/15/21  Yes Tonya Needs, NP  Ferrous Sulfate (IRON) 325 (65 Fe) MG TABS Take 325 mg by mouth daily. 08/13/17  Yes [provider]  FLUoxetine (PROZAC) 40 MG capsule Take 40 mg by mouth daily.    Yes [provider]  fluticasone (FLONASE) 50 MCG/ACT nasal spray Place 1 spray into both nostrils daily as needed for allergies.  08/14/19  Yes [provider]  furosemide (LASIX) 40 MG tablet Take 2 tablets (80 mg total) by mouth daily. 04/12/21  Yes Alicen Donalson, MD  gabapentin (NEURONTIN) 300 MG capsule Take 300 mg by mouth at bedtime.   Yes [provider]  glipiZIDE (GLUCOTROL) 10 MG tablet Take 10 mg by mouth 2 (two) times daily before a meal.   Yes [provider]  HYDROcodone-acetaminophen (NORCO/VICODIN) 5-325 MG tablet Take 1 tablet by mouth every 6 (six) hours as needed for moderate pain or severe pain.   Yes [provider]  hydrocortisone 2.5 % cream Apply 1 application topically daily as needed (dry skin).  07/15/17  Yes [provider]   levothyroxine (SYNTHROID, LEVOTHROID) 50 MCG tablet Take 50 mcg by mouth daily before breakfast.   Yes [provider]  magnesium oxide (MAG-OX) 400 (241.3 Mg) MG tablet Take 400 mg by mouth daily. 06/02/20  Yes [provider]  montelukast (SINGULAIR) 10 MG tablet Take 10 mg by mouth at bedtime.   Yes [provider]  nystatin ointment (MYCOSTATIN) Apply 1 application topically daily as needed (rash). 100,000- units/gram   Yes [provider]  olmesartan (BENICAR) 40 MG tablet Take 40 mg by mouth daily. 04/02/20  Yes [provider]  STIOLTO RESPIMAT 2.5-2.5 MCG/ACT AERS Inhale 2 puffs into the lungs daily. 10/12/20  Yes [provider]  temazepam (RESTORIL) 30 MG capsule Take 30 mg by mouth at bedtime. 07/07/17  Yes [provider]  traMADol (ULTRAM) 50 MG tablet Take 50 mg by mouth every 6 (six)  hours as needed for moderate pain.  07/23/13  Yes [provider]  glucose blood test strip 1 each by Other route. Use as instructed    [provider]     Allergies:   No Known Allergies  Social History:   Social History   Socioeconomic History   Marital status: Widowed    Spouse name: Not on file   Number of children: 2   Years of education: dental sch   Highest education level: Not on file  Occupational History   Occupation: retired  Tobacco Use   Smoking status: Former    Packs/day: 2.00    Years: 40.00    Pack years: 80.00    Types: Cigarettes    Quit date: 05/29/1999    Years since quitting: 22.0   Smokeless tobacco: Never  Vaping Use   Vaping Use: Never used  Substance and Sexual Activity   Alcohol use: No   Drug use: No   Sexual activity: Yes  Other Topics Concern   Not on file  Social History Narrative   Not on file   Social Determinants of Health   Financial Resource Strain: Not on file  Food Insecurity: Not on file  Transportation Glass: Not on file  Physical Activity: Not on file   Stress: Not on file  Social Connections: Not on file  Intimate Partner Violence: Not on file    Family History:   The patient's family history includes Breast cancer in her maternal aunt; Colon polyps in her brother and daughter; Diabetes in her maternal aunt and paternal aunt; Heart Problems in her father; Hypertension in her brother; Lung cancer in her mother; Skin cancer in her brother.    ROS:  Please see the history of present illness.  All other ROS reviewed and negative.     Physical Exam/Data:   Vitals:   06/13/21 0824  BP: (!) 155/78  Pulse: 67  Resp: 17  Temp: 97.7 F (36.5 C)  TempSrc: Temporal  SpO2: 100%  Weight: 97.1 kg  Height: 5' (1.524 m)   No intake or output data in the 24 hours ending 06/13/21 0831 Last 3 Weights 06/13/2021 04/12/2021 02/28/2021  Weight (lbs) 214 lb 207 lb 199 lb  Weight (kg) 97.07 kg 93.895 kg 90.266 kg     Body mass index is 41.79 kg/m.  General:  Well nourished, well developed, in no acute distress HEENT: normal Neck: Hard to see, but probably 8 cm JVD Vascular: No carotid bruits; Distal pulses 2+ bilaterally   Cardiac:  normal S1, paradoxically split S2; RRR; 3/6 early peaking systolic ejection murmur at the right upper sternal border, 2/6 decrescendo diastolic murmur at the right and left lower sternal borders, 2/6 holosystolic murmur at the apex radiating to the axilla Lungs:  clear to auscultation bilaterally, no wheezing, rhonchi or rales  Abd: soft, nontender, no hepatomegaly  Ext: Symmetrical 3+ lower extremity edema Musculoskeletal:  No deformities, BUE and BLE strength normal and equal Skin: warm and dry  Neuro:  CNs 2-12 intact, no focal abnormalities noted Psych:  Normal affect    EKG:  The ECG that was done 02/28/2021 was personally reviewed and demonstrates AV sequential pacing  Relevant CV Studies:  1. Left ventricular ejection fraction, by estimation, is 60 to 65%. The  left ventricle has normal function. The  left ventricle has no regional  wall motion abnormalities. Left ventricular diastolic parameters are  consistent with Grade II diastolic  dysfunction (pseudonormalization).   2. Right  ventricular systolic function is normal. The right ventricular  size is mildly enlarged. There is moderately elevated pulmonary artery  systolic pressure. The estimated right ventricular systolic pressure is  93.7 mmHg.   3. Left atrial size was mildly dilated.   4. Right atrial size was mildly dilated.   5. Restricted posterior leaflet. Splay artifact (consider TEE for further  evaluation). The mitral valve is degenerative. Moderate to severe mitral  valve regurgitation. No evidence of mitral stenosis. Moderate mitral  annular calcification.   6. Tricuspid valve regurgitation is moderate to severe.   7. The aortic valve has been repaired/replaced. Aortic valve  regurgitation is moderate to severe. No aortic stenosis is present. There  is a 23 mm Perimount Magna Ease valve present in the aortic position.  Procedure Date: 01/26/13. Echo findings are  consistent with normal structure and function of the aortic valve  prosthesis. Aortic regurgitation PHT measures 317 msec. Aortic valve mean  gradient measures 21.0 mmHg. Aortic valve Vmax measures 3.01 m/s.   8. The inferior vena cava is normal in size with greater than 50%  respiratory variability, suggesting right atrial pressure of 3 mmHg.   Comparison(s): Prior images reviewed side by side. 05/27/19 EF 55-60% AV  6mmHg mean PG, 29mmHg peak PG. Mild AI.   Laboratory Data:  High Sensitivity Troponin:  No results for input(s): TROPONINIHS in the last 720 hours.    Chemistry Recent Labs  Lab 06/06/21 1015  NA 144  K 4.1  CL 103  CO2 26  GLUCOSE 151*  BUN 22  CREATININE 1.25*  CALCIUM 8.8    No results for input(s): PROT, ALBUMIN, AST, ALT, ALKPHOS, BILITOT in the last 168 hours. Lipids No results for input(s): CHOL, TRIG, HDL, LABVLDL, LDLCALC,  CHOLHDL in the last 168 hours. Hematology Recent Labs  Lab 06/06/21 1015  WBC 7.6  RBC 3.80  HGB 11.3  HCT 35.8  MCV 94  MCH 29.7  MCHC 31.6  RDW 13.2  PLT 196   Thyroid No results for input(s): TSH, FREET4 in the last 168 hours. BNPNo results for input(s): BNP, PROBNP in the last 168 hours.  DDimer No results for input(s): DDIMER in the last 168 hours.   Radiology/Studies:  No results found.   Assessment and Plan:   Mitral regurgitation, possibly severe: 1 possible explanation for recent problems with worsening pulmonary artery hypertension tension and heart failure.  For transesophageal echo today. This procedure has been fully reviewed with the patient and written informed consent has been obtained. CHF: Still has evidence of hypervolemia.  In addition to mitral insufficiency, additional contributors to heart failure include high burden of right ventricular pacing, left ventricular diastolic dysfunction related to aortic stenosis, right heart failure also worsened by obesity and moderate to severe tricuspid regurgitation. PAH: Probably mostly due to left heart failure, but there may be a component of COPD. TR: Moderate to severe, probably mostly related to pulmonary artery hypertension, but pacemaker lead position may be contributory. Bioprosthetic AVR/aortic root replacement: Known longstanding perivalvular aortic insufficiency, does not appear severe enough to be contributing to heart failure. CAD: She only had a minor coronary atherosclerosis at the time of cardiac catheterization before aortic valve replacement.  She has not had angina pectoris.  Consider right and left heart catheterization if it appears that she Glass aggressive treatment for mitral insufficiency. SSS: She has developed severe sinus node dysfunction and bradycardia and requires almost 100% atrial pacing. High-grade AV block: She initially developed complete heart block  following aortic valve replacement but  then recovered AV conduction.  There has been a steady increase in the length of the AV delay at normal heart rates, which has made it virtually impossible to allow native AV conduction even with maximum AV delay prolongation on her pacemaker and adjustment of pacing rates. Atrial flutter: Has not been a problem recently.  Had successful overdrive pacing in April 2021 and then had a recurrent event in January that resolved spontaneously.  Not on antiarrhythmics.  On appropriate Eliquis anticoagulation for CHA2DS2-VASc score of 7 (age 65, gender, HTN, CHF, DM, CAD.  She has never had embolic events/stroke/TIA. Acquired thrombophilia/anticoagulation:  Tolerating Eliquis without serious bleeding complications, but has mild anemia (actually most recent hemoglobin increased from 10.3 in October to 11.3 last week). Pacemaker: Implanted in 2014, normal device function.  Unfortunately unable to prolonged AV delay beyond 380 ms to avoid V pacing. Venous stasis ulcers: Partly due to right heart failure, there may also be underlying peripheral venous insufficiency.  Healing. DM/HLP: Most recent labs from PCP show LDL cholesterol 91, HDL 80, triglycerides 55, total cholesterol 185 earlier this month.   Risk Assessment/Risk Scores:       New York Heart Association (NYHA) Functional Class NYHA Class II  CHA2DS2-VASc Score = 7   This indicates a 11.2% annual risk of stroke. The patient's score is based upon: CHF History: 1 HTN History: 1 Diabetes History: 1 Stroke History: 0 Vascular Disease History: 1 Age Score: 2 Gender Score: 1   For questions or updates, please contact Rew Please consult [email or web address] for contact info under     Signed, Sanda Klein, MD  06/13/2021 8:31 AM

## 2021-06-13 NOTE — Anesthesia Preprocedure Evaluation (Signed)
Anesthesia Evaluation  Patient identified by MRN, date of birth, ID band Patient awake    Reviewed: Allergy & Precautions, NPO status , Patient's Chart, lab work & pertinent test results  History of Anesthesia Complications Negative for: history of anesthetic complications  Airway Mallampati: II  TM Distance: >3 FB Neck ROM: Full    Dental  (+) Edentulous Upper, Edentulous Lower   Pulmonary COPD,  COPD inhaler, former smoker,    breath sounds clear to auscultation       Cardiovascular hypertension, Pt. on medications and Pt. on home beta blockers + CAD, + CABG and +CHF  + dysrhythmias Atrial Fibrillation + pacemaker + Valvular Problems/Murmurs (s/p AVR)  Rhythm:Irregular Rate:Normal + Systolic murmurs and + Diastolic murmurs 62/56/3893 ECHO: EF 60-65%, normal LVF, Grade 2 DD, normal RVF, mod-severe MR with restricted post leaflet, s/p AVR, mod-severe AI   Neuro/Psych Anxiety Depression CVA    GI/Hepatic negative GI ROS, Neg liver ROS,   Endo/Other  diabetes (glu 112), Oral Hypoglycemic AgentsHypothyroidism   Renal/GU Renal InsufficiencyRenal disease     Musculoskeletal  (+) Arthritis ,   Abdominal   Peds  Hematology eliquis   Anesthesia Other Findings   Reproductive/Obstetrics                             Anesthesia Physical Anesthesia Plan  ASA: 4  Anesthesia Plan: MAC   Post-op Pain Management: Minimal or no pain anticipated   Induction:   PONV Risk Score and Plan: 2 and Treatment may vary due to age or medical condition  Airway Management Planned: Natural Airway and Nasal Cannula  Additional Equipment: None  Intra-op Plan:   Post-operative Plan:   Informed Consent: I have reviewed the patients History and Physical, chart, labs and discussed the procedure including the risks, benefits and alternatives for the proposed anesthesia with the patient or authorized representative  who has indicated his/her understanding and acceptance.       Plan Discussed with: CRNA and Surgeon  Anesthesia Plan Comments:         Anesthesia Quick Evaluation

## 2021-06-14 ENCOUNTER — Other Ambulatory Visit: Payer: Self-pay | Admitting: Cardiovascular Disease

## 2021-06-22 ENCOUNTER — Other Ambulatory Visit: Payer: Self-pay

## 2021-06-22 ENCOUNTER — Ambulatory Visit (INDEPENDENT_AMBULATORY_CARE_PROVIDER_SITE_OTHER): Payer: Medicare Other | Admitting: Cardiovascular Disease

## 2021-06-22 ENCOUNTER — Ambulatory Visit: Payer: Medicare Other | Admitting: Cardiovascular Disease

## 2021-06-22 ENCOUNTER — Encounter: Payer: Self-pay | Admitting: Cardiovascular Disease

## 2021-06-22 VITALS — BP 149/64 | HR 60 | Ht 61.0 in | Wt 212.4 lb

## 2021-06-22 DIAGNOSIS — I2510 Atherosclerotic heart disease of native coronary artery without angina pectoris: Secondary | ICD-10-CM

## 2021-06-22 DIAGNOSIS — L97929 Non-pressure chronic ulcer of unspecified part of left lower leg with unspecified severity: Secondary | ICD-10-CM

## 2021-06-22 DIAGNOSIS — I495 Sick sinus syndrome: Secondary | ICD-10-CM | POA: Diagnosis not present

## 2021-06-22 DIAGNOSIS — N1832 Chronic kidney disease, stage 3b: Secondary | ICD-10-CM

## 2021-06-22 DIAGNOSIS — I83019 Varicose veins of right lower extremity with ulcer of unspecified site: Secondary | ICD-10-CM | POA: Diagnosis not present

## 2021-06-22 DIAGNOSIS — I351 Nonrheumatic aortic (valve) insufficiency: Secondary | ICD-10-CM | POA: Diagnosis not present

## 2021-06-22 DIAGNOSIS — D649 Anemia, unspecified: Secondary | ICD-10-CM

## 2021-06-22 DIAGNOSIS — E039 Hypothyroidism, unspecified: Secondary | ICD-10-CM

## 2021-06-22 DIAGNOSIS — E1122 Type 2 diabetes mellitus with diabetic chronic kidney disease: Secondary | ICD-10-CM

## 2021-06-22 DIAGNOSIS — I441 Atrioventricular block, second degree: Secondary | ICD-10-CM | POA: Diagnosis not present

## 2021-06-22 DIAGNOSIS — Z953 Presence of xenogenic heart valve: Secondary | ICD-10-CM | POA: Diagnosis not present

## 2021-06-22 DIAGNOSIS — J449 Chronic obstructive pulmonary disease, unspecified: Secondary | ICD-10-CM

## 2021-06-22 DIAGNOSIS — I5033 Acute on chronic diastolic (congestive) heart failure: Secondary | ICD-10-CM | POA: Diagnosis not present

## 2021-06-22 DIAGNOSIS — I10 Essential (primary) hypertension: Secondary | ICD-10-CM

## 2021-06-22 DIAGNOSIS — L97919 Non-pressure chronic ulcer of unspecified part of right lower leg with unspecified severity: Secondary | ICD-10-CM

## 2021-06-22 DIAGNOSIS — E785 Hyperlipidemia, unspecified: Secondary | ICD-10-CM

## 2021-06-22 DIAGNOSIS — D6869 Other thrombophilia: Secondary | ICD-10-CM

## 2021-06-22 DIAGNOSIS — I83029 Varicose veins of left lower extremity with ulcer of unspecified site: Secondary | ICD-10-CM

## 2021-06-22 DIAGNOSIS — Z9889 Other specified postprocedural states: Secondary | ICD-10-CM

## 2021-06-22 DIAGNOSIS — Z950 Presence of cardiac pacemaker: Secondary | ICD-10-CM

## 2021-06-22 DIAGNOSIS — I484 Atypical atrial flutter: Secondary | ICD-10-CM

## 2021-06-22 MED ORDER — DAPAGLIFLOZIN PROPANEDIOL 10 MG PO TABS: 10.0000 mg | ORAL_TABLET | Freq: Every day | ORAL | 4 refills | Status: DC

## 2021-06-22 NOTE — Progress Notes (Signed)
Cardiology Office Note    Date:  06/22/2021   ID:  Tonya Glass, DOB Jan 06, 1946, MRN [id number]  PCP:  Sonia Side., FNP  Cardiologist:  Kathy Breach, M.D.; Sanda Klein, MD   Chief complaint: Pacemaker follow-up ; venous stasis ulcer  History of Present Illness:  Tonya Glass is a 76 y.o. 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  She had paroxysmal atrial flutter and underwent successful overdrive pacing in 3762, with recurrence in January 2022 that resolved spontaneously.  She has minor CAD by previous angiography (40% mid LAD pre-AVR 2014).    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  This was moderate by TEE performed 06/13/2021  Despite increasing the dose of diuretic, she continues to have significant edema of both lower extremities.  She has a larger ulcer on the right external supramalleolar area and 3 smaller ulcers on the left medial calf, a couple of which are still oozing.  She has only lost 2 pounds in weight since we increased her dose of torsemide.  She continues to weigh about 28 pounds more than she did in 2020 and 16 pounds more than she did a year ago.  Continues to have NYHA functional class II exertional dyspnea, activity limited in great part by her recent spinal fracture.  She has not had any problems with hypotension, dizziness or syncope. She has an intermittent cough productive of thick mucus.  She does not have orthopnea or PND.  She is not aware of any palpitations and denies dizziness or syncope.  The most recent creatinine was improved at 1.25, despite increasing diuretics.  I would expect that  this is probably her baseline renal function.  Her hemoglobin has normalized at 11.3.  Have tried to identify the cause of her worsening heart failure, focusing in on pacemaker function and valvular abnormalities.  There was a substantial increase in the burden of ventricular pacing.  We cut back on her carvedilol dose to see if this would lead to less ventricular pacing.  Pacemaker reprogramming performed at her last appointment has had limited success in reducing V pacing: ventricular pacing has diminished from 90% to 74%.  She has a very long AV delay at 376 ms on today's tracing.  She also has 87% atrial pacing and has not had any atrial fibrillation.  She has had a handful of episodes of nonsustained VT, none recorded in the last month.  Estimated generator longevity is 1.5 years.  The TEE showed that the perivalvular aortic leak is more substantially than initial thought and is at least moderate in severity.  There seem to be 2 separate jets, 1 of which is dominant and located close to the ostium of the left coronary artery.  During her TEE there was clear evidence of elevated mean left atrial pressure and mild pulmonary hypertension with estimated systolic PAP of 40 mmHg.  The mitral valve was also abnormal, with changes suggestive of postinflammatory/rheumatic thickening and restriction, but there was only mild-moderate mitral insufficiency.   A BNP checked last month was elevated at 519 (and her creatinine was 1.68), similar to January 2019 when the BNP was 550 (but at that time her creatinine was 0.65, latter labs from  [facility]).  N-terminal proBNP was 8992 in March 2021.  Her creatinine is 1.68, similar to September, substantially better than early 2019 when it was 2.5-3.0, but higher than November 2019 when it was 1.28 and 2021 when it was down to 1.0 (latter labs from [facility]).    She describes a chronic cough productive of thick yellow-green mucus, not frothy mucus.   The cough does worsen when she lies down.  She currently does not smoke, but she does have a 40-pack-year history of smoking.  She reports good glycemic control on metformin monotherapy.  She also reports a good lipid profile on atorvastatin.  Unfortunately, do not have these values available for review   Past Medical History:  Diagnosis Date   Anxiety    Aortic valve stenosis 10/10/2016   Arthritis    "fingers" (07/24/2017)   CHF (congestive heart failure) (HCC)    COPD (chronic obstructive pulmonary disease) (HCC)    Depression    Gallstones    Heart murmur    High cholesterol    History of kidney stones    Hypertension    Hypothyroidism    Migraines    "none in years til 01/2013-02/2013; none since"  (07/24/2017)   Orthopnoea    "just since open heart surgery" (02/25/2013)   Pacemaker    [employer]    Pneumonia ~ 2012   "walking pneumonia"   Pneumonia 07/23/2017   Stroke ([place]) 2001   denies residual on 02/25/2013   Symptomatic hypotension 10/10/2016   Syncope and collapse    "prior to open heart surgery" (02/25/2013)   Type II diabetes mellitus ([place])     Past Surgical History:  Procedure Laterality Date   BREAST SURGERY Right    "clamped blood vessel and stitched it up; in [place]"   [place]  01/26/2013   "cow valve put in; widened the second valve" (02/25/2013) - [place], [place]   CATARACT EXTRACTION W/ INTRAOCULAR LENS  IMPLANT, BILATERAL Bilateral 04/2017   CHOLECYSTECTOMY OPEN  1971   CYST EXCISION     "taken off my chest"   INSERT / REPLACE / REMOVE PACEMAKER  01/19/2013   [place] RESECTION  2009   in [place]   TEE WITHOUT CARDIOVERSION N/A 06/13/2021   Procedure: TRANSESOPHAGEAL ECHOCARDIOGRAM (TEE);  Surgeon: Sanda Klein, MD;  Location: [facility] END[facility]COPY;  Service: Cardiovascular;  Laterality: N/A;   VEIN SURGERY Left    LLE; cut leg open and took out piece of  vein that was protruding thru skin; stitched it up; done in [place]    Current Medications: Outpatient Medications Prior to Visit  Medication Sig Dispense Refill   albuterol (PROVENTIL HFA;VENTOLIN HFA) 108 (90 BASE) MCG/ACT inhaler Inhale 2 puffs into the lungs every 6 (six) hours as needed for wheezing or shortness of breath. Use as directed -  With exercise     alendronate (FOSAMAX) 70 MG tablet Take 70 mg by mouth every 7 (seven) days. SUNDAYS     amLODipine (NORVASC) 2.5 MG tablet Take 2.5 mg every night 90 tablet 3   atorvastatin (LIPITOR) 40 MG tablet Take 40 mg by mouth daily.     carvedilol (COREG) 6.25 MG tablet Take 1 tablet (6.25 mg total) by mouth 2 (two) times daily. 180 tablet 3   cloNIDine (CATAPRES) 0.2 MG tablet Take 1 tablet (0.2 mg total) by mouth at bedtime. 30 tablet 11  ELIQUIS 5 MG TABS tablet Take 1 tablet (5 mg total) by mouth 2 (two) times daily. 180 tablet 1   Ferrous Sulfate (IRON) 325 (65 Fe) MG TABS Take 325 mg by mouth daily.  0   FLUoxetine (PROZAC) 40 MG capsule Take 40 mg by mouth daily.      fluticasone (FLONASE) 50 MCG/ACT nasal spray Place 1 spray into both nostrils daily as needed for allergies.      furosemide (LASIX) 40 MG tablet Take 2 tablets (80 mg) every morning and one tablet (40 mg) every afternoon 90 tablet 11   gabapentin (NEURONTIN) 300 MG capsule Take 300 mg by mouth at bedtime.     glipiZIDE (GLUCOTROL) 10 MG tablet Take 10 mg by mouth 2 (two) times daily before a meal.     glucose blood test strip 1 each by Other route. Use as instructed     hydrocortisone 2.5 % cream Apply 1 application topically daily as needed (dry skin).      levothyroxine (SYNTHROID, LEVOTHROID) 50 MCG tablet Take 50 mcg by mouth daily before breakfast.     magnesium oxide (MAG-OX) 400 (241.3 Mg) MG tablet Take 400 mg by mouth daily.     montelukast (SINGULAIR) 10 MG tablet Take 10 mg by mouth at bedtime.     nystatin ointment (MYCOSTATIN) Apply 1 application topically  daily as needed (rash). 100,000- units/gram     olmesartan (BENICAR) 40 MG tablet Take 40 mg by mouth daily.     STIOLTO RESPIMAT 2.5-2.5 MCG/ACT AERS Inhale 2 puffs into the lungs daily.     temazepam (RESTORIL) 30 MG capsule Take 30 mg by mouth at bedtime.     traMADol (ULTRAM) 50 MG tablet Take 50 mg by mouth every 6 (six) hours as needed for moderate pain.      HYDROcodone-acetaminophen (NORCO/VICODIN) 5-325 MG tablet Take 1 tablet by mouth every 6 (six) hours as needed for moderate pain or severe pain. (Patient not taking: Reported on 06/22/2021)     No facility-administered medications prior to visit.     Allergies:   Patient has no known allergies.   Social History   Socioeconomic History   Marital status: Widowed    Spouse name: Not on file   Number of children: 2   Years of education: dental sch   Highest education level: Not on file  Occupational History   Occupation: retired  Tobacco Use   Smoking status: Former    Packs/day: 2.00    Years: 40.00    Pack years: 80.00    Types: Cigarettes    Quit date: 05/29/1999    Years since quitting: 22.0   Smokeless tobacco: Never  Vaping Use   Vaping Use: Never used  Substance and Sexual Activity   Alcohol use: No   Drug use: No   Sexual activity: Yes  Other Topics Concern   Not on file  Social History Narrative   Not on file   Social Determinants of Health   Financial Resource Strain: Not on file  Food Insecurity: Not on file  Transportation Needs: Not on file  Physical Activity: Not on file  Stress: Not on file  Social Connections: Not on file     Family History:  The patient's family history includes Breast cancer in her maternal aunt; Colon polyps in her brother and daughter; Diabetes in her maternal aunt and paternal aunt; Heart Problems in her father; Hypertension in her brother; Lung cancer in her mother; Skin cancer in  her brother.   ROS:   Please see the history of present illness.    ROS All other systems  are reviewed and are negative.   PHYSICAL EXAM:   VS:  BP (!) 149/64 (BP Location: Left Arm, Patient Position: Sitting, Cuff Size: Normal)    Pulse 60    Ht 5\' 1"  (1.549 m)    Wt 212 lb 6.4 oz (96.3 kg)    SpO2 97%    BMI 40.13 kg/m     General: Alert, oriented x3, no distress, healthy left subclavian pacemaker site.  Morbidly obese. Head: no evidence of trauma, PERRL, EOMI, no exophtalmos or lid lag, no myxedema, no xanthelasma; normal ears, nose and oropharynx Neck: normal jugular venous pulsations and no hepatojugular reflux; brisk carotid pulses without delay and no carotid bruits Chest: clear to auscultation, no signs of consolidation by percussion or palpation, normal fremitus, symmetrical and full respiratory excursions Cardiovascular: normal position and quality of the apical impulse, regular rhythm, normal first and second heart sounds, no murmurs, rubs or gallops Abdomen: no tenderness or distention, no masses by palpation, no abnormal pulsatility or arterial bruits, normal bowel sounds, no hepatosplenomegaly Extremities: no clubbing, cyanosis or edema; 2+ radial, ulnar and brachial pulses bilaterally; 2+ right femoral, posterior tibial and dorsalis pedis pulses; 2+ left femoral, posterior tibial and dorsalis pedis pulses; no subclavian or femoral bruits Neurological: grossly nonfocal Psych: Normal mood and affect   Wt Readings from Last 3 Encounters:  06/22/21 212 lb 6.4 oz (96.3 kg)  06/13/21 214 lb (97.1 kg)  04/12/21 207 lb (93.9 kg)      Studies/Labs Reviewed:   TEE 06/13/2021  1. Left ventricular ejection fraction, by estimation, is 60 to 65%. The  left ventricle has normal function. The left ventricle has no regional  wall motion abnormalities. Left ventricular diastolic parameters are  consistent with Grade II diastolic  dysfunction (pseudonormalization). Elevated left atrial pressure.   2. Right ventricular systolic function is mildly reduced. The right   ventricular size is mildly enlarged. There is mildly elevated pulmonary  artery systolic pressure. The estimated right ventricular systolic  pressure is 95.6 mmHg.   3. Left atrial size was mildly dilated. No left atrial/left atrial  appendage thrombus was detected.   4. Right atrial size was mildly dilated.   5. 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 The effective  regurgitant orifice area is 0.2 cm sq, regurgitant volume 45 ml  (regurgitant fraction is inaccurate due  to significant aortic insufficiency. The mitral valve is rheumatic. Mild  to moderate mitral valve regurgitation. No evidence of mitral stenosis.   6. Tricuspid valve regurgitation is moderate.   7. There appear to be two areas of perivalvular leak of the bioprosthetic  AVR. A larger jet is located at 3 o'clock in the vicinity of the left  coronary cusp. A smaller jet is located posteriorly at 12 o'clock towards  the mitral annulus. There is also a   trivial intravalvular jet. The aortic valve has been repaired/replaced.  Aortic valve regurgitation is moderate. There is a 23 mm Magna valve  present in the aortic position. Procedure Date: 2014 [facility]. Aortic  valve mean gradient measures 13.9 mmHg.  Aortic valve Vmax measures 2.70 m/s. Aortic valve acceleration time  measures 74 msec.   8. S/P ascending aorta graft repair, which is intact. Aortic  root/ascending aorta has been repaired/replaced. There is Moderate (Grade  III) plaque  involving the descending aorta.   EKG:  EKG is ordered today and personally reviewed.  This shows atrial paced, ventricular sensed rhythm with a very long AV delay at 376 ms, right bundle branch block, T wave inversion is prominent throughout all anterior precordial leads also in lead III and aVF.  QRS 154 ms, QTc 484 ms  ASSESSMENT:    1. Acute on chronic diastolic heart failure (HCC)   2.  Venous stasis ulcers of both lower extremities (HCC)   3. SSS (sick sinus syndrome) ([place])   4. Second degree atrioventricular block   5. S/P aortic valve replacement with bioprosthetic valve   6. Pacemaker   7. Nonrheumatic aortic (valve) insufficiency: Perivalvular leak   8. H/O aortic root repair   9. Essential hypertension   10. Coronary artery disease involving native coronary artery of native heart without angina pectoris   11. Atypical atrial flutter ([place])   12. Acquired thrombophilia ([place])   13. Type 2 diabetes mellitus with stage 3b chronic kidney disease, without long-term current use of insulin ([place])   14. Hyperlipidemia LDL goal <70   15. Chronic obstructive pulmonary disease, unspecified COPD type ([place])   16. Acquired hypothyroidism   17. Anemia, unspecified type      PLAN:  In order of problems listed above:  CHF: She is clearly still hypervolemic even after the increase in dose of loop diuretic.  Renal function has actually improved, though.  We will add Farxiga 10 mg daily.  While is possible she has gained some real weight, she is probably still carrying about 20-25 pounds of fluid overload.  Creatinine has improved which is a promising development..  It does not look like we will get a make a whole lot of change by adjusting the pacemaker settings, since she will still likely require a very high burden of ventricular pacing.  I think we should get a better evaluation of the perivalvular aortic prosthesis leak with a CT angiogram and review the findings with the structural heart team.  Reminded her of the importance of a sodium restricted diet and daily weight monitoring. Venous stasis ulcer/peripheral venous insufficiency: Some of her lower extremity edema is also likely due to this.  Doppler ultrasound performed 04/21/2021 did not show any evidence of DVT or venous reflux that would be amenable to treatment.  Try to keep legs elevated as much as possible and wear compression  stockings.  The stasis ulcers will not resolve without reduction in edema. SSS: She is not pacemaker dependent but does have severe underlying sinus bradycardia as well as a very long AV conduction time.   Second-degree AV block: Her pacemaker was initially implanted for complete heart block immediately after aortic valve replacement, but she subsequently recovered AV conduction.  Currently has an extremely long first-degree AV block and only conducts at slow atrial rates. PPM: Normal device function, with high burden of ventricular pacing.  After all the changes and AV delay and lower rate limit, still has about 75% V pacing.  If LVEF were to decline, option for CRT. S/p AVR: Longstanding perivalvular aortic insufficiency, present since the initial surgery.  The severity of this was underestimated by previous transthoracic studies and appears to be at least moderate by TEE.  We will check a CT angiogram to evaluate this better. History of aortic root replacement: Intact ascending aortic repair at the last TEE. HTN: Blood pressure little high today after we cut back on her carvedilol.  She is on maximum  dose ARB.  I am reluctant to increase her amlodipine due to the severity of her swelling. Coronary atherosclerosis was noted in the LAD and the right coronary artery on CT scan from November 2019.  Has never had clinically relevant CAD or any angina pectoris.  We should be able to evaluate this on coronary CTA. Aflutter: None has been recorded recently.  Successfully underwent overdrive pacing in April 2021.  Had recurrence of the arrhythmia in January 2022, but the flutter resolved spontaneously.  It has not recurred since then and antiarrhythmics do not appear indicated.  On appropriate Eliquis anticoagulation. CHA2DS2-VASc 7 (age 69, gender, HTN, CHF, DM, CAD). Anticoagulation: No bleeding complications on Eliquis DM: Was hoping to see you the superficial skin lesions on her legs heal first, but added  Farxiga 10 mg daily today for her heart failure since it does not appear we have been making much progress otherwise.  If she develops hypoglycemia, will decrease the dose of glipizide and continue Farxiga for heart failure. CKD3b: Proved creatinine, current baseline around 1.25, GFR 45-50 HLP: On statin.  Need to get most recent lipid profile from PCP. COPD: She has a chronic cough productive of greenish and yellow sputum that is thick and mucousy, sounds more like chronic bronchitis than heart failure fluid.  She is receiving Stiolto and Symbicort daily as well as albuterol as needed. Hypothyroidism: On levothyroxine supplementation.  No recent TSH available.  Clinically euthyroid. Anemia: Hemoglobin returned to normal by most recent labs performed a few days ago.   Medication Adjustments/Labs and Tests Ordered: Current medicines are reviewed at length with the patient today.  Concerns regarding medicines are outlined above.  Medication changes, Labs and Tests ordered today are listed in the Patient Instructions below. Patient Instructions  Medication Instructions:  START Farxiga 10 mg once daily  *If you need a refill on your cardiac medications before your next appointment, please call your pharmacy*  Follow-Up: At [facility], you and your health needs are our priority.  As part of our continuing mission to provide you with exceptional heart care, we have created designated Provider Care Teams.  These Care Teams include your primary Cardiologist (physician) and Advanced Practice Providers (APPs -  Physician Assistants and Nurse Practitioners) who all work together to provide you with the care you need, when you need it.  We recommend signing up for the patient portal called "MyChart".  Sign up information is provided on this After Visit Summary.  MyChart is used to connect with patients for Virtual Visits (Telemedicine).  Patients are able to view lab/test results, encounter notes,  upcoming appointments, etc.  Non-urgent messages can be sent to your provider as well.   To learn more about what you can do with MyChart, go to [email or web address].    Your next appointment:   3 month(s) on a device day  The format for your next appointment:   In Person  Provider:   Sanda Klein, MD    Your cardiac CT will be scheduled at one of the below locations:   [facility] [address] [place], [place] [address] [phone]  [place] [address] [place], [place] [address] [phone]  If scheduled at [facility], please arrive at the [facility] main entrance (entrance A) of [facility] 30 minutes prior to test start time. You can use the FREE valet parking offered at the main entrance (encouraged to control the heart  rate for the test) Proceed to the [facility] Radiology Department (first floor) to check-in and test prep.  If scheduled at [facility], please arrive 15 mins early for check-in and test prep.  Please follow these instructions carefully (unless otherwise directed):  On the Night Before the Test: Be sure to Drink plenty of water. Do not consume any caffeinated/decaffeinated beverages or chocolate 12 hours prior to your test. Do not take any antihistamines 12 hours prior to your test.  On the Day of the Test: Drink plenty of water until 1 hour prior to the test. Do not eat any food 4 hours prior to the test. You may take your regular medications prior to the test.  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 Take metoprolol (Lopressor) two hours prior to test. HOLD Furosemide morning of the test. FEMALES- please wear underwire-free bra if available, avoid dresses & tight clothing      After the Test: Drink plenty of water. After receiving IV contrast, you may experience a mild flushed feeling. This is normal. On occasion, you may  experience a mild rash up to 24 hours after the test. This is not dangerous. If this occurs, you can take Benadryl 25 mg and increase your fluid intake. If you experience trouble breathing, this can be serious. If it is severe call 911 IMMEDIATELY. If it is mild, please call our office. If you take any of these medications: Glipizide/Metformin, Avandament, Glucavance, please do not take 48 hours after completing test unless otherwise instructed.  We will call to schedule your test 2-4 weeks out understanding that some insurance companies will need an authorization prior to the service being performed.   For non-scheduling related questions, please contact the cardiac imaging nurse navigator should you have any questions/concerns: Marchia Bond, Cardiac Imaging Nurse Navigator Gordy Clement, Cardiac Imaging Nurse Navigator [facility] Direct Office Dial: [phone]   For scheduling needs, including cancellations and rescheduling, please call [place], [phone].     Signed, Sanda Klein, MD  06/22/2021 1:02 PM    [facility] [place], [place], [place]  [address] Phone: [phone]; Fax: [phone]

## 2021-06-22 NOTE — Patient Instructions (Signed)
Medication Instructions:  START Farxiga 10 mg once daily  *If you need a refill on your cardiac medications before your next appointment, please call your pharmacy*  Follow-Up: At [facility], you and your health needs are our priority.  As part of our continuing mission to provide you with exceptional heart care, we have created designated Provider Care Teams.  These Care Teams include your primary Cardiologist (physician) and Advanced Practice Providers (APPs -  Physician Assistants and Nurse Practitioners) who all work together to provide you with the care you need, when you need it.  We recommend signing up for the patient portal called "MyChart".  Sign up information is provided on this After Visit Summary.  MyChart is used to connect with patients for Virtual Visits (Telemedicine).  Patients are able to view lab/test results, encounter notes, upcoming appointments, etc.  Non-urgent messages can be sent to your provider as well.   To learn more about what you can do with MyChart, go to [email or web address].    Your next appointment:   3 month(s) on a device day  The format for your next appointment:   In Person  Provider:   Sanda Klein, MD    Your cardiac CT will be scheduled at one of the below locations:   [facility] [address] [place], [place] [address] [phone]  [place] [address] [place], [place] [address] [phone]  If scheduled at [facility], please arrive at the [facility] main entrance (entrance A) of [facility] 30 minutes prior to test start time. You can use the FREE valet parking offered at the main entrance (encouraged to control the heart rate for the test) Proceed to the [facility] Radiology Department (first floor) to check-in and test prep.  If scheduled at [facility], please arrive 15 mins early for check-in and test  prep.  Please follow these instructions carefully (unless otherwise directed):  On the Night Before the Test: Be sure to Drink plenty of water. Do not consume any caffeinated/decaffeinated beverages or chocolate 12 hours prior to your test. Do not take any antihistamines 12 hours prior to your test.  On the Day of the Test: Drink plenty of water until 1 hour prior to the test. Do not eat any food 4 hours prior to the test. You may take your regular medications prior to the test.  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 Take metoprolol (Lopressor) two hours prior to test. HOLD Furosemide morning of the test. FEMALES- please wear underwire-free bra if available, avoid dresses & tight clothing      After the Test: Drink plenty of water. After receiving IV contrast, you may experience a mild flushed feeling. This is normal. On occasion, you may experience a mild rash up to 24 hours after the test. This is not dangerous. If this occurs, you can take Benadryl 25 mg and increase your fluid intake. If you experience trouble breathing, this can be serious. If it is severe call 911 IMMEDIATELY. If it is mild, please call our office. If you take any of these medications: Glipizide/Metformin, Avandament, Glucavance, please do not take 48 hours after completing test unless otherwise instructed.  We will call to schedule your test 2-4 weeks out understanding that some insurance companies will need an authorization prior to the service being performed.   For non-scheduling related questions, please contact the cardiac imaging nurse navigator should you have any questions/concerns: Clarise Cruz  Juleen China, Cardiac Imaging Nurse Navigator Gordy Clement, Cardiac Imaging Nurse Navigator [facility] Direct Office Dial: [phone]   For scheduling needs, including cancellations and rescheduling, please call [place], [phone].

## 2021-06-28 ENCOUNTER — Telehealth: Payer: Self-pay

## 2021-06-28 DIAGNOSIS — T8203XS Leakage of heart valve prosthesis, sequela: Secondary | ICD-10-CM

## 2021-06-28 NOTE — Telephone Encounter (Signed)
Discussed with Dr. Sallyanne Kuster. Will add CT C/A/P to cardiac CT already scheduled 2/23.

## 2021-07-05 ENCOUNTER — Telehealth (HOSPITAL_COMMUNITY): Payer: Self-pay | Admitting: *Deleted

## 2021-07-05 ENCOUNTER — Telehealth: Payer: Self-pay

## 2021-07-05 ENCOUNTER — Telehealth: Payer: Self-pay | Admitting: Cardiovascular Disease

## 2021-07-05 MED ORDER — CARVEDILOL 6.25 MG PO TABS: 6.2500 mg | ORAL_TABLET | Freq: Two times a day (BID) | ORAL | 3 refills | Status: DC

## 2021-07-05 MED ORDER — FUROSEMIDE 40 MG PO TABS: ORAL_TABLET | ORAL | 11 refills | Status: DC

## 2021-07-05 MED ORDER — CLONIDINE HCL 0.2 MG PO TABS: 0.2000 mg | ORAL_TABLET | Freq: Every day | ORAL | 11 refills | Status: DC

## 2021-07-05 MED ORDER — ELIQUIS 5 MG PO TABS: 5.0000 mg | ORAL_TABLET | Freq: Two times a day (BID) | ORAL | 1 refills | Status: DC

## 2021-07-05 MED ORDER — AMLODIPINE BESYLATE 2.5 MG PO TABS: ORAL_TABLET | ORAL | 3 refills | Status: DC

## 2021-07-05 NOTE — Telephone Encounter (Signed)
Reaching out to patient to offer assistance regarding upcoming cardiac imaging study; pt verbalizes understanding of appt date/time, parking situation and where to check in, pre-test NPO status and medications ordered, and verified current allergies; name and call back number provided for further questions should they arise  Gordy Clement RN Navigator [facility] [phone] office [phone] cell  Patient is aware to arrive at 8:30am for her 9am scan.

## 2021-07-05 NOTE — Telephone Encounter (Signed)
Called pt to verified that pt would like to use this Mypharmacy for her cardiac medications. Pt states yes she wants to use this place. Refills sent as requested

## 2021-07-05 NOTE — Telephone Encounter (Signed)
Received call from patients PCP office requesting to confirm if patient should still be on Amlodipine 2.5mg  daily- and Magnesium. Advised that per last OV note she should still be on Amlodipine 2.5mg . PCP office will check into whether patient should still be on Magnesium or not as this was previously managed through them.   Advised them to call back with any issues or concerns.

## 2021-07-05 NOTE — Telephone Encounter (Signed)
My Pharmacy called wanting a list of patient's current medications, along with any new scripts she may have. It can be faxed to faxed [phone].

## 2021-07-06 ENCOUNTER — Ambulatory Visit (HOSPITAL_COMMUNITY)
Admission: RE | Admit: 2021-07-06 | Discharge: 2021-07-06 | Disposition: A | Discharge status: Home / Self Care | Payer: Medicare Other | Source: Ambulatory Visit | Attending: Cardiovascular Disease | Admitting: Cardiovascular Disease

## 2021-07-06 ENCOUNTER — Other Ambulatory Visit: Payer: Self-pay

## 2021-07-06 DIAGNOSIS — T8203XS Leakage of heart valve prosthesis, sequela: Secondary | ICD-10-CM | POA: Diagnosis present

## 2021-07-06 DIAGNOSIS — Z953 Presence of xenogenic heart valve: Secondary | ICD-10-CM | POA: Insufficient documentation

## 2021-07-06 MED ORDER — NITROGLYCERIN 0.4 MG SL SUBL
SUBLINGUAL_TABLET | SUBLINGUAL | Status: AC
  Filled 2021-07-06: qty 2

## 2021-07-06 MED ORDER — NITROGLYCERIN 0.4 MG SL SUBL
0.8000 mg | SUBLINGUAL_TABLET | Freq: Once | SUBLINGUAL | Status: AC
Start: 2021-07-06 — End: 2021-07-06
  Administered 2021-07-06: 0.8 mg via SUBLINGUAL

## 2021-07-06 MED ORDER — IOHEXOL 350 MG/ML SOLN
95.0000 mL | Freq: Once | INTRAVENOUS | Status: AC | PRN
  Administered 2021-07-06: 95 mL via INTRAVENOUS

## 2021-07-12 ENCOUNTER — Ambulatory Visit (INDEPENDENT_AMBULATORY_CARE_PROVIDER_SITE_OTHER): Payer: Medicare Other

## 2021-07-12 DIAGNOSIS — I495 Sick sinus syndrome: Secondary | ICD-10-CM | POA: Diagnosis not present

## 2021-07-12 LAB — CUP PACEART REMOTE DEVICE CHECK
Battery Remaining Longevity: 18 mo
Battery Remaining Percentage: 32 %
Brady Statistic RA Percent Paced: 86 %
Brady Statistic RV Percent Paced: 78 %
Date Time Interrogation Session: 20230301044100
Implantable Lead Implant Date: 20140908
Implantable Lead Implant Date: 20140908
Implantable Lead Location: 753859
Implantable Lead Location: 753860
Implantable Lead Model: 4135
Implantable Lead Model: 4136
Implantable Lead Serial Number: 29485718
Implantable Lead Serial Number: 29487623
Implantable Pulse Generator Implant Date: 20140908
Lead Channel Impedance Value: 571 Ohm
Lead Channel Impedance Value: 763 Ohm
Lead Channel Pacing Threshold Amplitude: 0.7 V
Lead Channel Pacing Threshold Pulse Width: 0.4 ms
Lead Channel Setting Pacing Amplitude: 2 V
Lead Channel Setting Pacing Amplitude: 2.5 V
Lead Channel Setting Pacing Pulse Width: 0.4 ms
Lead Channel Setting Sensing Sensitivity: 2.5 mV
Pulse Gen Serial Number: 160873

## 2021-07-13 ENCOUNTER — Telehealth: Payer: Self-pay | Admitting: Cardiovascular Disease

## 2021-07-13 NOTE — Telephone Encounter (Signed)
Daughter of this patient called. This patient needs to see another specialist. The daughter wanted to know if the patient needs to see Dr. Loletha Grayer before going the specialist. The daughter would like to speak to [place]. ? ? ?

## 2021-07-13 NOTE — Telephone Encounter (Signed)
Spoke with Dr. Sallyanne Kuster- who recommends that patient be seen in the office to discuss patients CT scan and plan more thoroughly.  ? ?Spoke with patients daughter Tonya Glass- advised her that Dr. Loletha Grayer does need to see patient in office to discuss this further and plan. Patients daughter states she "is not even sure patient will agree to heart cath" and "not even sure if she can get patient to come in to be seen", patients daughter states that she will try and talk to her mom about it but "is not sure if she will need to change Dr's or not as she thinks that Dr. Loletha Grayer does not have time for patient". Advised patients daughter that is certainly not the case and that Dr. Loletha Grayer is working diligently to address patients needs and concerns. Patients daughter would like patient seen sooner- advised that I did not see any openings and Dr. Loletha Grayer is not in office today or tomorrow. Advised her I would check for any openings or if any openings came available. Advised patients daughter to call back with any issues, questions, or concerns and she verbalized understanding.  ? ?

## 2021-07-13 NOTE — Telephone Encounter (Signed)
Returned call to daughter she is asking if pt can just go to the referral or does the pt need to go to both appointments.  Informed daughter to come to scheduled appointment with Dr Sallyanne Kuster to discuss future steps. ?

## 2021-07-13 NOTE — Telephone Encounter (Signed)
Sanda Klein, MD  ?07/13/2021  1:17 PM EST   ?  ?Reviewed the CT data with my partner Dr. Burt Knack and a couple of other specialist and structural heart interventions.  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  The question that remains is whether or not the leak surrounding the aortic valve is really severe enough to explain Shearon's recent problems with congestive heart failure.  We will plan to first perform a diagnostic right and left heart catheterization (this will be an outpatient procedure) and we will discuss this at the time of her upcoming appointment.  ? ?Called and spoke with patients daughter (okay per DPR), advised her of the above from Dr. Sallyanne Kuster. Patients daughter is upset that patient needs another test. Patients daughter adamantly asking "why wasn't the heart cath done to begin with"- advised patient's daughter to keep follow up appointment to address all questions and concerns. Patients daughter states she wants patients heart cath done before seeing Dr. C back. Patients daughter states that she "feels like we are moving backwards instead of forwards". Advised her of reasoning for having patient back in clinic to discuss and set up heart cath. Patients daughter states that she "is very upset about this and not happy with patient needing another test" patients daughter states that she "will find another Dr. If she needs to". Advised patients daughter that we are doing everything we can to take care of patient, patients daughter would like to know if patient can be scheduled for heart cath before coming back to see Dr. Loletha Grayer. Advised patients daughter I would forward message to Dr. Loletha Grayer for him to review and advise. Patient's daughter expressed gratitude for phone call and following up.  ? ?Will forward to MD ?

## 2021-07-14 NOTE — Telephone Encounter (Signed)
Called patient's daughter- scheduled patient's daughter to see Dr. Sallyanne Kuster on 07/19/21- okay per Cristopher Peru RN.  ? ?Patients daughter is aware of appointment time and date- and verbalized understanding.  ?

## 2021-07-19 ENCOUNTER — Ambulatory Visit (INDEPENDENT_AMBULATORY_CARE_PROVIDER_SITE_OTHER): Payer: Medicare Other | Admitting: Cardiovascular Disease

## 2021-07-19 ENCOUNTER — Encounter: Payer: Self-pay | Admitting: Cardiovascular Disease

## 2021-07-19 ENCOUNTER — Other Ambulatory Visit: Payer: Self-pay

## 2021-07-19 VITALS — BP 118/56 | HR 61 | Ht 61.0 in | Wt 212.0 lb

## 2021-07-19 DIAGNOSIS — E785 Hyperlipidemia, unspecified: Secondary | ICD-10-CM

## 2021-07-19 DIAGNOSIS — I10 Essential (primary) hypertension: Secondary | ICD-10-CM

## 2021-07-19 DIAGNOSIS — I484 Atypical atrial flutter: Secondary | ICD-10-CM

## 2021-07-19 DIAGNOSIS — I83019 Varicose veins of right lower extremity with ulcer of unspecified site: Secondary | ICD-10-CM | POA: Diagnosis not present

## 2021-07-19 DIAGNOSIS — I5033 Acute on chronic diastolic (congestive) heart failure: Secondary | ICD-10-CM

## 2021-07-19 DIAGNOSIS — Z9889 Other specified postprocedural states: Secondary | ICD-10-CM

## 2021-07-19 DIAGNOSIS — N1832 Chronic kidney disease, stage 3b: Secondary | ICD-10-CM

## 2021-07-19 DIAGNOSIS — I83029 Varicose veins of left lower extremity with ulcer of unspecified site: Secondary | ICD-10-CM

## 2021-07-19 DIAGNOSIS — E1122 Type 2 diabetes mellitus with diabetic chronic kidney disease: Secondary | ICD-10-CM

## 2021-07-19 DIAGNOSIS — T8203XS Leakage of heart valve prosthesis, sequela: Secondary | ICD-10-CM

## 2021-07-19 DIAGNOSIS — I495 Sick sinus syndrome: Secondary | ICD-10-CM

## 2021-07-19 DIAGNOSIS — L97929 Non-pressure chronic ulcer of unspecified part of left lower leg with unspecified severity: Secondary | ICD-10-CM

## 2021-07-19 DIAGNOSIS — Z950 Presence of cardiac pacemaker: Secondary | ICD-10-CM

## 2021-07-19 DIAGNOSIS — D6869 Other thrombophilia: Secondary | ICD-10-CM

## 2021-07-19 DIAGNOSIS — I2510 Atherosclerotic heart disease of native coronary artery without angina pectoris: Secondary | ICD-10-CM

## 2021-07-19 DIAGNOSIS — I441 Atrioventricular block, second degree: Secondary | ICD-10-CM

## 2021-07-19 DIAGNOSIS — L97919 Non-pressure chronic ulcer of unspecified part of right lower leg with unspecified severity: Secondary | ICD-10-CM

## 2021-07-19 LAB — CBC
Hematocrit: 36.6 % (ref 34.0–46.6)
Hemoglobin: 11.9 g/dL (ref 11.1–15.9)
MCH: 30.4 pg (ref 26.6–33.0)
MCHC: 32.5 g/dL (ref 31.5–35.7)
MCV: 94 fL (ref 79–97)
Platelets: 192 10*3/uL (ref 150–450)
RBC: 3.91 x10E6/uL (ref 3.77–5.28)
RDW: 13.1 % (ref 11.7–15.4)
WBC: 8 10*3/uL (ref 3.4–10.8)

## 2021-07-19 LAB — BASIC METABOLIC PANEL
BUN/Creatinine Ratio: 29 — ABNORMAL HIGH (ref 12–28)
BUN: 49 mg/dL — ABNORMAL HIGH (ref 8–27)
CO2: 24 mmol/L (ref 20–29)
Calcium: 8.9 mg/dL (ref 8.7–10.3)
Chloride: 100 mmol/L (ref 96–106)
Creatinine, Ser: 1.69 mg/dL — ABNORMAL HIGH (ref 0.57–1.00)
Glucose: 129 mg/dL — ABNORMAL HIGH (ref 70–99)
Potassium: 4.8 mmol/L (ref 3.5–5.2)
Sodium: 141 mmol/L (ref 134–144)
eGFR: 31 mL/min/{1.73_m2} — ABNORMAL LOW (ref >59)

## 2021-07-19 NOTE — Patient Instructions (Addendum)
Medication Instructions:  ?No changes ?*If you need a refill on your cardiac medications before your next appointment, please call your pharmacy* ? ?Testing/Procedures: ?Your physician has requested that you have a cardiac catheterization. Cardiac catheterization is used to diagnose and/or treat various heart conditions. Doctors may recommend this procedure for a number of different reasons. The most common reason is to evaluate chest pain. Chest pain can be a symptom of coronary artery disease (CAD), and cardiac catheterization can show whether plaque is narrowing or blocking your heart?s arteries. This procedure is also used to evaluate the valves, as well as measure the blood flow and oxygen levels in different parts of your heart. For further information please visit [email or web address]. Please follow instruction sheet, as given. ? ? ? ?Follow-Up: ?At [facility], you and your health needs are our priority.  As part of our continuing mission to provide you with exceptional heart care, we have created designated Provider Care Teams.  These Care Teams include your primary Cardiologist (physician) and Advanced Practice Providers (APPs -  Physician Assistants and Nurse Practitioners) who all work together to provide you with the care you need, when you need it. ? ?We recommend signing up for the patient portal called "MyChart".  Sign up information is provided on this After Visit Summary.  MyChart is used to connect with patients for Virtual Visits (Telemedicine).  Patients are able to view lab/test results, encounter notes, upcoming appointments, etc.  Non-urgent messages can be sent to your provider as well.   ?To learn more about what you can do with MyChart, go to [email or web address].   ? ?Your next appointment:   ?3 month(s) on a device day ? ?The format for your next appointment:   ?In Person ? ?Provider:   ?Dr. Sallyanne Kuster ? ?Other Instructions ? ?Tonya Glass ?[place] ?[address] ?[place] [address] ?Dept: [id number] ?Loc: [phone] ? ?Tonya Glass  07/19/2021 ? ?You are scheduled for a Cardiac Catheterization on Wednesday, March 22 with Dr. Sherren Mocha. ? ?1. Please arrive at the Main Entrance A at [facility]: [place], [place] [address] at 8 am (This time is two hours before your procedure to ensure your preparation). Free valet parking service is available.  ? ?Special note: Every effort is made to have your procedure done on time. Please understand that emergencies sometimes delay scheduled procedures. ? ?2. Diet: Do not eat solid foods after midnight.  You may have clear liquids until 5 AM upon the day of the procedure. ? ?3. Labs: You will need to have blood drawn today: CBC and BMET ? ?4. Medication instructions in preparation for your procedure: ?-Hold the Eliquis two days prior and the day of the procedure (hold 3/20, 3/21 and 3/22) ?-Hold the Furosemide the morning of the procedure ?-Hold all diabetic medication the morning of the procedure ?-Hold the Olmesartan the day before and the day of the procedure ? ?On the morning of your procedure, take Aspirin and any morning medicines NOT listed above.  You may use sips of water. ? ?5. Plan to go home the same day, you will only stay overnight if medically necessary. ?6. You MUST have a responsible adult to drive you home. ?7. An adult MUST be with you the first 24 hours after you arrive home. ?8. Bring a current list of your medications, and the last time and date medication taken. ?9. Bring ID and current insurance cards. ?10.Please  wear clothes that are easy to get on and off and wear slip-on shoes. ? ?Thank you for allowing [place] to care for you! ?  -- [facility] ? ? ?

## 2021-07-19 NOTE — Progress Notes (Signed)
Cardiology Office Note    Date:  07/19/2021   ID:  Tonya Glass, DOB 12-12-1945, MRN [id number]  PCP:  Sonia Side., FNP  Cardiologist:  Kathy Breach, M.D.; Sanda Klein, MD   Chief complaint: CHF, aortic insufficiency  History of Present Illness:  Tonya Glass is a 76 y.o. 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  She had paroxysmal atrial flutter and underwent successful overdrive pacing in 6010, with recurrence in January 2022 that resolved spontaneously.  She has minor CAD by previous angiography (40% mid LAD pre-AVR 2014).    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  Mitral regurgitation, which appeared moderate-severe by transthoracic echo, was much less significant at the time of the TEE, suggesting a secondary/functional mechanism.  She is doing better from a heart failure standpoint.  Able to use her walker to get from the bedroom to the bathroom without dyspnea, without stopping.  Although she still has some lower extremity edema, all her open ulcerations have healed and scabbed over.  No weeping wounds.  Wearing compression stockings faithfully.  Unfortunately the other day when she tried to pull her compression stockings on she fell off her couch, injuring only her backside.  Despite the higher dose of diuretics, her weight remains elevated at 212 pounds.  She denies orthopnea, but she has been sleeping in a recliner for years due to her history of injury to her coccyx.  Does not have PND.  Denies angina pectoris at rest or with activity.  Her cough is a little  better.  Still producing thick mucus, not frothy sputum.  She has not had any bleeding problems.  She denies fever or chills.  ECG today continues to show 100% ventricular pacing, although there is some evidence of fusion with narrow QRS complexes.  This is despite a markedly prolonged AV delay of 400 ms, as much as her device will allow.  Her blood pressure remains well controlled even after we cut back her carvedilol dose, and an attempt to lessen the amount of ventricular pacing.  She has not had any recent atrial fibrillation or atrial flutter.  The TEE showed that the perivalvular aortic leak is more substantial than initial thought and is at least moderate in severity.  There seem to be 2 separate jets, 1 of which is dominant and located close to the ostium of the left coronary artery.  During her TEE there was clear evidence of elevated mean left atrial pressure and mild pulmonary hypertension with estimated systolic PAP of 40 mmHg.  The mitral valve was also abnormal, with changes suggestive of postinflammatory/rheumatic thickening and restriction, but there was only mild-moderate mitral insufficiency.  Follow-up CT confirms the presence of the perivalvular aortic leak, estimated to be moderate, in the vicinity of the left coronary ostium.  Her most recent creatinine was 1.25, estimated to probably be her baseline, we are rechecking this today.   A BNP checked last month was elevated at 519 (and her creatinine was 1.68), similar to January 2019 when the BNP was 550 (but at that time her creatinine was 0.65, [facility]).  N-terminal proBNP was 8992  in March 2021.  Her creatinine is 1.68, similar to September, substantially better than early 2019 when it was 2.5-3.0, but higher than November 2019 when it was 1.28 and 2021 when it was down to 1.0 (latter labs from [facility]).    She reports good glycemic control on metformin monotherapy.  She also reports a good  lipid profile on atorvastatin.  Unfortunately, do not have these values available for review.  Checking today.   Past Medical History:  Diagnosis Date   Anxiety    Aortic valve stenosis 10/10/2016   Arthritis    "fingers" (07/24/2017)   CHF (congestive heart failure) (HCC)    COPD (chronic obstructive pulmonary disease) (HCC)    Depression    Gallstones    Heart murmur    High cholesterol    History of kidney stones    Hypertension    Hypothyroidism    Migraines    "none in years til 01/2013-02/2013; none since"  (07/24/2017)   Orthopnoea    "just since open heart surgery" (02/25/2013)   Pacemaker    [employer]    Pneumonia ~ 2012   "walking pneumonia"   Pneumonia 07/23/2017   Stroke ([place]) 2001   denies residual on 02/25/2013   Symptomatic hypotension 10/10/2016   Syncope and collapse    "prior to open heart surgery" (02/25/2013)   Type II diabetes mellitus ([place])     Past Surgical History:  Procedure Laterality Date   BREAST SURGERY Right    "clamped blood vessel and stitched it up; in [place]"   [place]  01/26/2013   "cow valve put in; widened the second valve" (02/25/2013) - [place], [place]   CATARACT EXTRACTION W/ INTRAOCULAR LENS  IMPLANT, BILATERAL Bilateral 04/2017   CHOLECYSTECTOMY OPEN  1971   CYST EXCISION     "taken off my chest"   INSERT / REPLACE / REMOVE PACEMAKER  01/19/2013   [place] RESECTION  2009   in [place]   TEE WITHOUT CARDIOVERSION N/A 06/13/2021   Procedure: TRANSESOPHAGEAL ECHOCARDIOGRAM (TEE);  Surgeon: Sanda Klein, MD;  Location: [facility] END[facility]COPY;  Service: Cardiovascular;  Laterality: N/A;   VEIN SURGERY Left    LLE; cut leg open and took out piece of vein that was protruding thru skin; stitched it up; done in [place]    Current Medications: Outpatient Medications Prior to Visit  Medication Sig Dispense Refill   albuterol (PROVENTIL  HFA;VENTOLIN HFA) 108 (90 BASE) MCG/ACT inhaler Inhale 2 puffs into the lungs every 6 (six) hours as needed for wheezing or shortness of breath. Use as directed -  With exercise     alendronate (FOSAMAX) 70 MG tablet Take 70 mg by mouth every 7 (seven) days. SUNDAYS     amLODipine (NORVASC) 2.5 MG tablet Take 2.5 mg every night 90 tablet 3   atorvastatin (LIPITOR) 40 MG tablet Take 40 mg by mouth daily.     carvedilol (COREG) 6.25 MG tablet Take 1 tablet (6.25 mg total) by mouth 2 (two) times daily. 180 tablet 3   cloNIDine (CATAPRES) 0.2 MG tablet Take 1 tablet (0.2 mg total) by mouth at bedtime. 30 tablet 11   dapagliflozin propanediol (FARXIGA) 10 MG TABS tablet Take 1 tablet (10 mg total) by mouth daily before breakfast. 30 tablet 4   ELIQUIS 5 MG TABS tablet Take 1 tablet (5 mg total) by mouth 2 (two) times daily. 180 tablet  1   ergocalciferol (VITAMIN D2) 1.25 MG (50000 UT) capsule Take 50,000 Units by mouth once a week.     Ferrous Sulfate (IRON) 325 (65 Fe) MG TABS Take 325 mg by mouth daily.  0   FLUoxetine (PROZAC) 40 MG capsule Take 40 mg by mouth daily.      fluticasone (FLONASE) 50 MCG/ACT nasal spray Place 1 spray into both nostrils daily as needed for allergies.      furosemide (LASIX) 40 MG tablet Take 2 tablets (80 mg) every morning and one tablet (40 mg) every afternoon (Patient taking differently: 40 mg. Take 2 tablets (80 mg) every morning and one tablet (40 mg) every afternoon) 90 tablet 11   gabapentin (NEURONTIN) 300 MG capsule Take 600 mg by mouth at bedtime.     glipiZIDE (GLUCOTROL) 10 MG tablet Take 10 mg by mouth 2 (two) times daily before a meal.     glucose blood test strip 1 each by Other route. Use as instructed     HYDROcodone-acetaminophen (NORCO/VICODIN) 5-325 MG tablet Take 1 tablet by mouth every 6 (six) hours as needed for moderate pain or severe pain.     hydrocortisone 2.5 % cream Apply 1 application topically daily as needed (dry skin).      levothyroxine  (SYNTHROID, LEVOTHROID) 50 MCG tablet Take 50 mcg by mouth daily before breakfast.     magnesium oxide (MAG-OX) 400 (241.3 Mg) MG tablet Take 400 mg by mouth daily.     Melatonin 10 MG TABS Take 10 mg by mouth daily.     montelukast (SINGULAIR) 10 MG tablet Take 10 mg by mouth at bedtime.     nystatin ointment (MYCOSTATIN) Apply 1 application topically daily as needed (rash). 100,000- units/gram     olmesartan (BENICAR) 40 MG tablet Take 40 mg by mouth daily.     STIOLTO RESPIMAT 2.5-2.5 MCG/ACT AERS Inhale 2 puffs into the lungs daily.     temazepam (RESTORIL) 30 MG capsule Take 30 mg by mouth at bedtime.     traMADol (ULTRAM) 50 MG tablet Take 50 mg by mouth every 6 (six) hours as needed for moderate pain.      No facility-administered medications prior to visit.     Allergies:   Patient has no known allergies.   Social History   Socioeconomic History   Marital status: Widowed    Spouse name: Not on file   Number of children: 2   Years of education: dental sch   Highest education level: Not on file  Occupational History   Occupation: retired  Tobacco Use   Smoking status: Former    Packs/day: 2.00    Years: 40.00    Pack years: 80.00    Types: Cigarettes    Quit date: 05/29/1999    Years since quitting: 22.1   Smokeless tobacco: Never  Vaping Use   Vaping Use: Never used  Substance and Sexual Activity   Alcohol use: No   Drug use: No   Sexual activity: Yes  Other Topics Concern   Not on file  Social History Narrative   Not on file   Social Determinants of Health   Financial Resource Strain: Not on file  Food Insecurity: Not on file  Transportation Needs: Not on file  Physical Activity: Not on file  Stress: Not on file  Social Connections: Not on file     Family History:  The patient's family history includes Breast cancer in her maternal aunt; Colon polyps in her  brother and daughter; Diabetes in her maternal aunt and paternal aunt; Heart Problems in her  father; Hypertension in her brother; Lung cancer in her mother; Skin cancer in her brother.   ROS:   Please see the history of present illness.    ROS All other systems are reviewed and are negative.   PHYSICAL EXAM:   VS:  BP (!) 118/56 (BP Location: Left Arm, Patient Position: Sitting, Cuff Size: Normal)    Pulse 61    Ht '5\' 1"'$  (1.549 m)    Wt 212 lb (96.2 kg)    BMI 40.06 kg/m      General: Alert, oriented x3, no distress, morbidly obese.  Healthy left subclavian pacemaker site. Head: no evidence of trauma, PERRL, EOMI, no exophtalmos or lid lag, no myxedema, no xanthelasma; normal ears, nose and oropharynx Neck: normal jugular venous pulsations and no hepatojugular reflux; brisk carotid pulses without delay and no carotid bruits Chest: clear to auscultation, no signs of consolidation by percussion or palpation, normal fremitus, symmetrical and full respiratory excursions Cardiovascular: normal position and quality of the apical impulse, regular rhythm, normal first and second heart sounds, no murmurs, rubs or gallops Abdomen: no tenderness or distention, no masses by palpation, no abnormal pulsatility or arterial bruits, normal bowel sounds, no hepatosplenomegaly Extremities: no clubbing, cyanosis or edema; 2+ radial, ulnar and brachial pulses bilaterally; 2+ right femoral, posterior tibial and dorsalis pedis pulses; 2+ left femoral, posterior tibial and dorsalis pedis pulses; no subclavian or femoral bruits Neurological: grossly nonfocal Psych: Normal mood and affect    Wt Readings from Last 3 Encounters:  07/19/21 212 lb (96.2 kg)  06/22/21 212 lb 6.4 oz (96.3 kg)  06/13/21 214 lb (97.1 kg)      Studies/Labs Reviewed:   TEE 06/13/2021  1. Left ventricular ejection fraction, by estimation, is 60 to 65%. The  left ventricle has normal function. The left ventricle has no regional  wall motion abnormalities. Left ventricular diastolic parameters are  consistent with Grade II  diastolic  dysfunction (pseudonormalization). Elevated left atrial pressure.   2. Right ventricular systolic function is mildly reduced. The right  ventricular size is mildly enlarged. There is mildly elevated pulmonary  artery systolic pressure. The estimated right ventricular systolic  pressure is 10.1 mmHg.   3. Left atrial size was mildly dilated. No left atrial/left atrial  appendage thrombus was detected.   4. Right atrial size was mildly dilated.   5. 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 The effective  regurgitant orifice area is 0.2 cm sq, regurgitant volume 45 ml  (regurgitant fraction is inaccurate due  to significant aortic insufficiency. The mitral valve is rheumatic. Mild  to moderate mitral valve regurgitation. No evidence of mitral stenosis.   6. Tricuspid valve regurgitation is moderate.   7. There appear to be two areas of perivalvular leak of the bioprosthetic  AVR. A larger jet is located at 3 o'clock in the vicinity of the left  coronary cusp. A smaller jet is located posteriorly at 12 o'clock towards  the mitral annulus. There is also a   trivial intravalvular jet. The aortic valve has been repaired/replaced.  Aortic valve regurgitation is moderate. There is a 23 mm Magna valve  present in the aortic position. Procedure Date: 2014 [facility]. Aortic  valve mean gradient measures 13.9 mmHg.  Aortic valve Vmax measures 2.70 m/s. Aortic valve acceleration time  measures 74 msec.  8. S/P ascending aorta graft repair, which is intact. Aortic  root/ascending aorta has been repaired/replaced. There is Moderate (Grade  III) plaque involving the descending aorta.   Cardiac CT angiography 07/06/2021 1.  Status post 47m Perimount Magna Ease aortic valve replacement   2. There is a paravalular leak adjacent to the left coronary cusp. PVL measures 850mx 16m11mn diameter with  perimeter 76m38md area 76mm14mand involved angle 34 degrees. There also appears to be a  second small PVL in the left coronary cusp close to the commissure between the left and noncoronary cusps that measures 4mm x36mm in52mameter with perimeter 11mm an66mea 6mm^2, a51minvolved angle 15 degrees.   3. Coronary calcium score 500 (86th percentile)      EKG:  EKG is ordered today and personally reviewed.  It shows AV sequential pacing with a very long delay at about 400 ms.  Some of the QRS complexes are narrow, suggesting some degree of native conduction with fusion.  Note positive R wave in lead V1.    ASSESSMENT:    1. Acute on chronic diastolic heart failure (HCC)   2.Harvardrivalvular leak of prosthetic heart valve, sequela   3. Venous stasis ulcers of both lower extremities (HCC)   4.[place]anoO aortic root repair   5. SSS (sick sinus syndrome) (HCC)   6. Second degree atrioventricular block   7. Pacemaker   8. Essential hypertension   9. Coronary artery disease involving native coronary artery of native heart without angina pectoris   10. 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  Seems to have been helped by taking Farxiga 10 mg daily.  Rechecking labs today.  She is still hypervolemic even after the increase in dose of loop diuretic.  Renal function has actually improved, though.  She weighs about 20-25 pounds more than she did a year ago, although some of this may be true weight gain.  "Dry weight" is unclear.  Have discussed further management with the structural heart team.  The  neck step will be to perform a right and left heart catheterization to try to get a better understanding of her degree of decompensation and to establish the significance of the perivalvular aortic insufficiency. S/p AVR: Longstanding perivalvular aortic insufficiency, present since the initial surgery.  The severity of this was underestimated by previous transthoracic studies and appears to be at least moderate by TEE.  The major leak is located in the vicinity of the left coronary ostium.  Have reviewed the CT images with the structural heart team and the feeling is that logistically it would be possible to place a couple of devices to occlude the perivalvular leak, without compromising the left coronary ostium.  Plan initially to perform right and left heart catheterization to confirm the hemodynamic significance of the perivalvular leak. History of aortic root replacement: Intact ascending aortic repair by TEE and CT. Venous stasis ulcer/peripheral venous insufficiency:  Leg ulcerations improved with conservative management (leg elevation and compression stockings.).  Some of her lower  extremity edema is also likely due to peripheral venous insufficiency, not just right heart failure.  Doppler ultrasound performed 04/21/2021 did not show any evidence of DVT or venous reflux that would be amenable to treatment.  SSS: She is not pacemaker dependent but does have severe underlying sinus bradycardia as well as a very long AV conduction time.  Quite sedentary.  Appropriate heart rate histogram distribution. Second-degree AV block: Her pacemaker was initially implanted for complete heart block immediately after aortic valve replacement, but she subsequently recovered AV conduction.  Currently has an extremely long first-degree AV block and only conducts at slow atrial rates.  Have not really been able to make a significant reduction prevalence of ventricular pacing even with maximum prolongation of the AV  delay. PPM: Normal device function, with high burden of ventricular pacing.  After all the changes and AV delay and lower rate limit, still has about 75% V pacing.  If LVEF were to decline, option for CRT.  Unable to recommend biventricular pacemaker upgrade with preserved LV function, however. HTN: Blood pressure is great even after cutting back to carvedilol.  She is on maximum dose ARB.  Avoid higher doses of amlodipine with her lower extremity swelling. Coronary atherosclerosis have nonobstructive lesions on preoperative cardiac catheterization 2014.  Calcium score on the most recent CT is high at 500 (86 percentile).  Plan right and left heart catheterization with coronary angiography. Aflutter: None has been recorded recently.  Successfully underwent overdrive pacing in April 2021.  Had recurrence of the arrhythmia in January 2022, but the flutter resolved spontaneously.  It has not recurred since then and antiarrhythmics do not appear indicated.  On appropriate Eliquis anticoagulation. CHA2DS2-VASc 7 (age 45, gender, HTN, CHF, DM, CAD). Anticoagulation: No bleeding complications on Eliquis. DM: Added Farxiga for heart failure.  So far no problems with hypoglycemia.  She is still also taking glipizide. CKD3b: Improved creatinine, current baseline around 1.25, GFR 45-50.  Rechecking labs today. HLP: On statin.  Target LDL less than 70 even though she has not had obstructive CAD to date, based on the severity of coronary calcium score elevation. COPD: She has a chronic cough productive of greenish and yellow sputum that is thick and mucousy, sounds more like chronic bronchitis than heart failure fluid.  She is receiving Stiolto and Symbicort daily as well as albuterol as needed. Hypothyroidism: On levothyroxine supplementation.  No recent TSH available.  Clinically euthyroid. Anemia: Hemoglobin returned to normal by most recent labs.    Shared Decision Making/Informed Consent The risks [stroke (1  in 1000), death (1 in 1000), kidney failure [usually temporary] (1 in 500), bleeding (1 in 200), allergic reaction [possibly serious] (1 in 200)], benefits (diagnostic support and management of coronary artery disease) and alternatives of a cardiac catheterization were discussed in detail with Ms. Fano and she is willing to proceed.     Medication Adjustments/Labs and Tests Ordered: Current medicines are reviewed at length with the patient today.  Concerns regarding medicines are outlined above.  Medication changes, Labs and Tests ordered today are listed in the Patient Instructions below. Patient Instructions  Medication Instructions:  No changes *If you need a refill on your cardiac medications before your next appointment, please call your pharmacy*  Testing/Procedures: Your physician has requested that you have a cardiac catheterization. Cardiac catheterization is used to diagnose and/or treat various heart conditions. Doctors may recommend this procedure for a number of different reasons. The most common reason is to evaluate chest pain. Chest  pain can be a symptom of coronary artery disease (CAD), and cardiac catheterization can show whether plaque is narrowing or blocking your hearts arteries. This procedure is also used to evaluate the valves, as well as measure the blood flow and oxygen levels in different parts of your heart. For further information please visit [email or web address]. Please follow instruction sheet, as given.    Follow-Up: At [facility], you and your health needs are our priority.  As part of our continuing mission to provide you with exceptional heart care, we have created designated Provider Care Teams.  These Care Teams include your primary Cardiologist (physician) and Advanced Practice Providers (APPs -  Physician Assistants and Nurse Practitioners) who all work together to provide you with the care you need, when you need it.  We recommend signing up for the  patient portal called "MyChart".  Sign up information is provided on this After Visit Summary.  MyChart is used to connect with patients for Virtual Visits (Telemedicine).  Patients are able to view lab/test results, encounter notes, upcoming appointments, etc.  Non-urgent messages can be sent to your provider as well.   To learn more about what you can do with MyChart, go to [email or web address].    Your next appointment:   3 month(s) on a device day  The format for your next appointment:   In Person  Provider:   Dr. Sallyanne Kuster  Other Instructions  [facility] CARDIOVASCULAR DIVISION [facility] [place] [address] Dept: [id number] Loc: [place]  07/19/2021  You are scheduled for a Cardiac Catheterization on Wednesday, March 22 with Dr. Sherren Mocha.  1. Please arrive at the Main Entrance A at [facility]: [place], [place] [address] at 8 am (This time is two hours before your procedure to ensure your preparation). Free valet parking service is available.   Special note: Every effort is made to have your procedure done on time. Please understand that emergencies sometimes delay scheduled procedures.  2. Diet: Do not eat solid foods after midnight.  You may have clear liquids until 5 AM upon the day of the procedure.  3. Labs: You will need to have blood drawn today: CBC and BMET  4. Medication instructions in preparation for your procedure: -Hold the Eliquis two days prior and the day of the procedure (hold 3/20, 3/21 and 3/22) -Hold the Furosemide the morning of the procedure -Hold all diabetic medication the morning of the procedure -Hold the Olmesartan the day before and the day of the procedure  On the morning of your procedure, take Aspirin and any morning medicines NOT listed above.  You may use sips of water.  5. Plan to go home the same day, you will only  stay overnight if medically necessary. 6. You MUST have a responsible adult to drive you home. 7. An adult MUST be with you the first 24 hours after you arrive home. 8. Bring a current list of your medications, and the last time and date medication taken. 9. Bring ID and current insurance cards. 10.Please wear clothes that are easy to get on and off and wear slip-on shoes.  Thank you for allowing [place] to care for you!   -- [facility] Invasive Cardiovascular services      Signed, Sanda Klein, MD  07/19/2021 1:50 PM    [address], [place], [place]  [address] Phone: [phone]; Fax: [phone]  [phone]  ° ° ° °

## 2021-07-19 NOTE — Progress Notes (Signed)
Remote pacemaker transmission.   

## 2021-07-19 NOTE — H&P (View-Only) (Signed)
? ?Cardiology Office Note   ? ?Date:  07/19/2021  ? ?ID:  Tonya Glass, DOB 06/17/1945, MRN [id number] ? ?PCP:  Tonya Side., FNP  ?Cardiologist:  Tonya Glass, M.D.; Tonya Klein, MD  ? ?Chief complaint: CHF, aortic insufficiency ? ?History of Present Illness:  ?Tonya Glass is a 76 y.o. 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  She had paroxysmal atrial flutter and underwent successful overdrive pacing in 0175, with recurrence in January 2022 that resolved spontaneously.  She has minor CAD by previous angiography (40% mid LAD pre-AVR 2014).    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  Mitral regurgitation, which appeared moderate-severe by transthoracic echo, was much less significant at the time of the TEE, suggesting a secondary/functional mechanism. ? ?She is doing better from a heart failure standpoint.  Able to use her walker to get from the bedroom to the bathroom without dyspnea, without stopping.  Although she still has some lower extremity edema, all her open ulcerations have healed and scabbed over.  No weeping wounds.  Wearing compression stockings faithfully.  Unfortunately the other day when she tried to pull her compression stockings on she fell off her couch, injuring only her backside.  Despite the higher dose of diuretics, her weight remains elevated at 212 pounds. ? ?She denies orthopnea, but she has been sleeping in a recliner for years due to her history of injury to her coccyx.  Does not have PND.  Denies angina pectoris at rest or with activity. ? ?Her cough is a little  better.  Still producing thick mucus, not frothy sputum.  She has not had any bleeding problems.  She denies fever or chills. ? ?ECG today continues to show 100% ventricular pacing, although there is some evidence of fusion with narrow QRS complexes.  This is despite a markedly prolonged AV delay of 400 ms, as much as her device will allow.  Her blood pressure remains well controlled even after we cut back her carvedilol dose, and an attempt to lessen the amount of ventricular pacing.  She has not had any recent atrial fibrillation or atrial flutter. ? ?The TEE showed that the perivalvular aortic leak is more substantial than initial thought and is at least moderate in severity.  There seem to be 2 separate jets, 1 of which is dominant and located close to the ostium of the left coronary artery.  During her TEE there was clear evidence of elevated mean left atrial pressure and mild pulmonary hypertension with estimated systolic PAP of 40 mmHg.  The mitral valve was also abnormal, with changes suggestive of postinflammatory/rheumatic thickening and restriction, but there was only mild-moderate mitral insufficiency. ? ?Follow-up CT confirms the presence of the perivalvular aortic leak, estimated to be moderate, in the vicinity of the left coronary ostium. ? ?Her most recent creatinine was 1.25, estimated to probably be her baseline, we are rechecking this today. ? ? ?A BNP checked last month was elevated at 519 (and her creatinine was 1.68), similar to January 2019 when the BNP was 550 (but at that time her creatinine was 0.65, latter labs [facility]).  N-terminal proBNP was 8992  in March 2021.  Her creatinine is 1.68, similar to September, substantially better than early 2019 when it was 2.5-3.0, but higher than November 2019 when it was 1.28 and 2021 when it was down to 1.0 (latter labs from [facility]).   ? ?She reports good glycemic control on metformin monotherapy.  She also reports a good  lipid profile on atorvastatin.  Unfortunately, do not have these values available for review.  Checking today. ? ? ?Past Medical History:  ?Diagnosis Date  ? Anxiety   ? Aortic valve stenosis 10/10/2016  ? Arthritis   ? "fingers" (07/24/2017)  ? CHF (congestive heart failure) ([place])   ? COPD (chronic obstructive pulmonary disease) ([place])   ? Depression   ? Gallstones   ? Heart murmur   ? High cholesterol   ? History of kidney stones   ? Hypertension   ? Hypothyroidism   ? Migraines   ? "none in years til 01/2013-02/2013; none since"  (07/24/2017)  ? Orthopnoea   ? "just since open heart surgery" (02/25/2013)  ? Pacemaker   ? [place]   ? Pneumonia ~ 2012  ? "walking pneumonia"  ? Pneumonia 07/23/2017  ? Stroke [facility]) 2001  ? denies residual on 02/25/2013  ? Symptomatic hypotension 10/10/2016  ? Syncope and collapse   ? "prior to open heart surgery" (02/25/2013)  ? Type II diabetes mellitus ([place])   ? ? ?Past Surgical History:  ?Procedure Laterality Date  ? BREAST SURGERY Right   ? "clamped blood vessel and stitched it up; in [place]"  ? CARDIAC CATHETERIZATION    ? CARDIAC VALVE REPLACEMENT  01/26/2013  ? "cow valve put in; widened the second valve" (02/25/2013) - [place], [place]  ? CATARACT EXTRACTION W/ INTRAOCULAR LENS  IMPLANT, BILATERAL Bilateral 04/2017  ? CHOLECYSTECTOMY OPEN  1971  ? CYST EXCISION    ? "taken off my chest"  ? INSERT / REPLACE / REMOVE PACEMAKER  01/19/2013  ? [employer] - dual chamber  ? LAPAROSCOPIC GASTRIC SLEEVE RESECTION  2009  ? in [place]  ? TEE WITHOUT CARDIOVERSION N/A 06/13/2021  ? Procedure: TRANSESOPHAGEAL ECHOCARDIOGRAM (TEE);  Surgeon: Tonya Klein, MD;  Location: [place];  Service: Cardiovascular;  Laterality: N/A;  ? VEIN SURGERY Left   ? LLE; cut leg open and took out piece of vein that was protruding thru skin; stitched it up; done in [place]  ? ? ?Current Medications: ?Outpatient Medications Prior to Visit  ?Medication Sig Dispense Refill  ? albuterol (PROVENTIL  HFA;VENTOLIN HFA) 108 (90 BASE) MCG/ACT inhaler Inhale 2 puffs into the lungs every 6 (six) hours as needed for wheezing or shortness of breath. Use as directed -  With exercise    ? alendronate (FOSAMAX) 70 MG tablet Take 70 mg by mouth every 7 (seven) days. SUNDAYS    ? amLODipine (NORVASC) 2.5 MG tablet Take 2.5 mg every night 90 tablet 3  ? atorvastatin (LIPITOR) 40 MG tablet Take 40 mg by mouth daily.    ? carvedilol (COREG) 6.25 MG tablet Take 1 tablet (6.25 mg total) by mouth 2 (two) times daily. 180 tablet 3  ? cloNIDine (CATAPRES) 0.2 MG tablet Take 1 tablet (0.2 mg total) by mouth at bedtime. 30 tablet 11  ? dapagliflozin propanediol (FARXIGA) 10 MG TABS tablet Take 1 tablet (10 mg total) by mouth daily before breakfast. 30 tablet 4  ? ELIQUIS 5 MG TABS tablet Take 1 tablet (5 mg total) by mouth 2 (two) times daily. 180 tablet  1  ? ergocalciferol (VITAMIN D2) 1.25 MG (50000 UT) capsule Take 50,000 Units by mouth once a week.    ? Ferrous Sulfate (IRON) 325 (65 Fe) MG TABS Take 325 mg by mouth daily.  0  ? FLUoxetine (PROZAC) 40 MG capsule Take 40 mg by mouth daily.     ? fluticasone (FLONASE) 50 MCG/ACT nasal spray Place 1 spray into both nostrils daily as needed for allergies.     ? furosemide (LASIX) 40 MG tablet Take 2 tablets (80 mg) every morning and one tablet (40 mg) every afternoon (Patient taking differently: 40 mg. Take 2 tablets (80 mg) every morning and one tablet (40 mg) every afternoon) 90 tablet 11  ? gabapentin (NEURONTIN) 300 MG capsule Take 600 mg by mouth at bedtime.    ? glipiZIDE (GLUCOTROL) 10 MG tablet Take 10 mg by mouth 2 (two) times daily before a meal.    ? glucose blood test strip 1 each by Other route. Use as instructed    ? HYDROcodone-acetaminophen (NORCO/VICODIN) 5-325 MG tablet Take 1 tablet by mouth every 6 (six) hours as needed for moderate pain or severe pain.    ? hydrocortisone 2.5 % cream Apply 1 application topically daily as needed (dry skin).     ? levothyroxine  (SYNTHROID, LEVOTHROID) 50 MCG tablet Take 50 mcg by mouth daily before breakfast.    ? magnesium oxide (MAG-OX) 400 (241.3 Mg) MG tablet Take 400 mg by mouth daily.    ? Melatonin 10 MG TABS Take 10 mg by

## 2021-07-27 ENCOUNTER — Encounter: Payer: Medicare Other | Admitting: Cardiovascular Disease

## 2021-08-03 ENCOUNTER — Telehealth: Payer: Self-pay | Admitting: *Deleted

## 2021-08-03 NOTE — Telephone Encounter (Signed)
Cardiac Catheterization scheduled at [facility] for: Monday August 07, 2021 12 Noon ?Arrival time and place: [place] [facility] A at: 7 AM -pre-procedure hydration ? ?No solid food after midnight prior to cath, clear liquids until 5 AM day of procedure. ? ?Medication instructions: ?-Hold: ? Eliquis-none 08/05/21 until post procedure ? Lasix/Olmesartan-day before and day of procedure-per protocol GFR 31 ? Glipizide/Farxiga-AM of procedure ?-Except hold medications usual morning medications can be taken with sips of water including aspirin 81 mg. ? ?Confirmed patient has responsible adult to drive home post procedure and be with patient first 24 hours after arriving home. ? ?Patient reports no new symptoms concerning for COVID-19/no exposure to COVID-19 in the past 10 days. ? ?Reviewed procedure instructions with patient. ? ?

## 2021-08-07 ENCOUNTER — Encounter (HOSPITAL_COMMUNITY)
Admission: RE | Disposition: A | Discharge status: Home / Self Care | Payer: Medicare Other | Source: Home / Self Care | Attending: Cardiovascular Disease

## 2021-08-07 ENCOUNTER — Ambulatory Visit (HOSPITAL_COMMUNITY)
Admission: RE | Admit: 2021-08-07 | Discharge: 2021-08-07 | Disposition: A | Discharge status: Home / Self Care | Payer: Medicare Other | Attending: Cardiovascular Disease | Admitting: Cardiovascular Disease

## 2021-08-07 ENCOUNTER — Other Ambulatory Visit: Payer: Self-pay

## 2021-08-07 DIAGNOSIS — Z87891 Personal history of nicotine dependence: Secondary | ICD-10-CM | POA: Diagnosis not present

## 2021-08-07 DIAGNOSIS — Z950 Presence of cardiac pacemaker: Secondary | ICD-10-CM | POA: Insufficient documentation

## 2021-08-07 DIAGNOSIS — T82897A Other specified complication of cardiac prosthetic devices, implants and grafts, initial encounter: Secondary | ICD-10-CM | POA: Insufficient documentation

## 2021-08-07 DIAGNOSIS — Z952 Presence of prosthetic heart valve: Secondary | ICD-10-CM | POA: Insufficient documentation

## 2021-08-07 DIAGNOSIS — I080 Rheumatic disorders of both mitral and aortic valves: Secondary | ICD-10-CM | POA: Insufficient documentation

## 2021-08-07 DIAGNOSIS — I495 Sick sinus syndrome: Secondary | ICD-10-CM | POA: Insufficient documentation

## 2021-08-07 DIAGNOSIS — E1122 Type 2 diabetes mellitus with diabetic chronic kidney disease: Secondary | ICD-10-CM | POA: Insufficient documentation

## 2021-08-07 DIAGNOSIS — D6859 Other primary thrombophilia: Secondary | ICD-10-CM | POA: Diagnosis not present

## 2021-08-07 DIAGNOSIS — Y832 Surgical operation with anastomosis, bypass or graft as the cause of abnormal reaction of the patient, or of later complication, without mention of misadventure at the time of the procedure: Secondary | ICD-10-CM | POA: Insufficient documentation

## 2021-08-07 DIAGNOSIS — Z79890 Hormone replacement therapy (postmenopausal): Secondary | ICD-10-CM | POA: Insufficient documentation

## 2021-08-07 DIAGNOSIS — I2510 Atherosclerotic heart disease of native coronary artery without angina pectoris: Secondary | ICD-10-CM | POA: Insufficient documentation

## 2021-08-07 DIAGNOSIS — I272 Pulmonary hypertension, unspecified: Secondary | ICD-10-CM | POA: Insufficient documentation

## 2021-08-07 DIAGNOSIS — E1151 Type 2 diabetes mellitus with diabetic peripheral angiopathy without gangrene: Secondary | ICD-10-CM | POA: Diagnosis not present

## 2021-08-07 DIAGNOSIS — Z7901 Long term (current) use of anticoagulants: Secondary | ICD-10-CM | POA: Insufficient documentation

## 2021-08-07 DIAGNOSIS — I484 Atypical atrial flutter: Secondary | ICD-10-CM | POA: Diagnosis not present

## 2021-08-07 DIAGNOSIS — D649 Anemia, unspecified: Secondary | ICD-10-CM | POA: Insufficient documentation

## 2021-08-07 DIAGNOSIS — I5033 Acute on chronic diastolic (congestive) heart failure: Secondary | ICD-10-CM | POA: Insufficient documentation

## 2021-08-07 DIAGNOSIS — I442 Atrioventricular block, complete: Secondary | ICD-10-CM | POA: Insufficient documentation

## 2021-08-07 DIAGNOSIS — J449 Chronic obstructive pulmonary disease, unspecified: Secondary | ICD-10-CM | POA: Insufficient documentation

## 2021-08-07 DIAGNOSIS — E039 Hypothyroidism, unspecified: Secondary | ICD-10-CM | POA: Insufficient documentation

## 2021-08-07 DIAGNOSIS — I130 Hypertensive heart and chronic kidney disease with heart failure and stage 1 through stage 4 chronic kidney disease, or unspecified chronic kidney disease: Secondary | ICD-10-CM | POA: Insufficient documentation

## 2021-08-07 DIAGNOSIS — Z79899 Other long term (current) drug therapy: Secondary | ICD-10-CM | POA: Insufficient documentation

## 2021-08-07 DIAGNOSIS — T8203XA Leakage of heart valve prosthesis, initial encounter: Secondary | ICD-10-CM | POA: Diagnosis present

## 2021-08-07 DIAGNOSIS — N1832 Chronic kidney disease, stage 3b: Secondary | ICD-10-CM | POA: Insufficient documentation

## 2021-08-07 DIAGNOSIS — Z7984 Long term (current) use of oral hypoglycemic drugs: Secondary | ICD-10-CM | POA: Diagnosis not present

## 2021-08-07 HISTORY — PX: RIGHT/LEFT HEART CATH AND CORONARY ANGIOGRAPHY: CATH118266

## 2021-08-07 HISTORY — PX: AORTIC ARCH ANGIOGRAPHY: CATH118224

## 2021-08-07 LAB — POCT I-STAT EG7
Acid-base deficit: 2 mmol/L (ref 0.0–2.0)
Bicarbonate: 25.5 mmol/L (ref 20.0–28.0)
Calcium, Ion: 1.14 mmol/L — ABNORMAL LOW (ref 1.15–1.40)
HCT: 35 % — ABNORMAL LOW (ref 36.0–46.0)
Hemoglobin: 11.9 g/dL — ABNORMAL LOW (ref 12.0–15.0)
O2 Saturation: 67 %
Potassium: 3.9 mmol/L (ref 3.5–5.1)
Sodium: 146 mmol/L — ABNORMAL HIGH (ref 135–145)
TCO2: 27 mmol/L (ref 22–32)
pCO2, Ven: 53.1 mmHg (ref 44–60)
pH, Ven: 7.29 (ref 7.25–7.43)
pO2, Ven: 39 mmHg (ref 32–45)

## 2021-08-07 LAB — POCT I-STAT 7, (LYTES, BLD GAS, ICA,H+H)
Acid-base deficit: 2 mmol/L (ref 0.0–2.0)
Bicarbonate: 24.9 mmol/L (ref 20.0–28.0)
Calcium, Ion: 1.22 mmol/L (ref 1.15–1.40)
HCT: 35 % — ABNORMAL LOW (ref 36.0–46.0)
Hemoglobin: 11.9 g/dL — ABNORMAL LOW (ref 12.0–15.0)
O2 Saturation: 98 %
Potassium: 4.2 mmol/L (ref 3.5–5.1)
Sodium: 143 mmol/L (ref 135–145)
TCO2: 26 mmol/L (ref 22–32)
pCO2 arterial: 47.8 mmHg (ref 32–48)
pH, Arterial: 7.324 — ABNORMAL LOW (ref 7.35–7.45)
pO2, Arterial: 107 mmHg (ref 83–108)

## 2021-08-07 LAB — GLUCOSE, CAPILLARY
Glucose-Capillary: 147 mg/dL — ABNORMAL HIGH (ref 70–99)
Glucose-Capillary: 149 mg/dL — ABNORMAL HIGH (ref 70–99)

## 2021-08-07 SURGERY — RIGHT/LEFT HEART CATH AND CORONARY ANGIOGRAPHY
Anesthesia: LOCAL

## 2021-08-07 MED ORDER — LIDOCAINE HCL (PF) 1 % IJ SOLN
INTRAMUSCULAR | Status: AC
  Filled 2021-08-07: qty 30

## 2021-08-07 MED ORDER — HEPARIN (PORCINE) IN NACL 1000-0.9 UT/500ML-% IV SOLN
INTRAVENOUS | Status: AC
  Filled 2021-08-07: qty 500

## 2021-08-07 MED ORDER — LABETALOL HCL 5 MG/ML IV SOLN: 10.0000 mg | INTRAVENOUS | Status: DC | PRN

## 2021-08-07 MED ORDER — SODIUM CHLORIDE 0.9 % IV SOLN: 250.0000 mL | INTRAVENOUS | Status: DC | PRN

## 2021-08-07 MED ORDER — SODIUM CHLORIDE 0.9% FLUSH: 3.0000 mL | Freq: Two times a day (BID) | INTRAVENOUS | Status: DC

## 2021-08-07 MED ORDER — ASPIRIN 81 MG PO CHEW: 81.0000 mg | CHEWABLE_TABLET | ORAL | Status: DC

## 2021-08-07 MED ORDER — ONDANSETRON HCL 4 MG/2ML IJ SOLN: 4.0000 mg | Freq: Four times a day (QID) | INTRAMUSCULAR | Status: DC | PRN

## 2021-08-07 MED ORDER — SODIUM CHLORIDE 0.9% FLUSH: 3.0000 mL | INTRAVENOUS | Status: DC | PRN

## 2021-08-07 MED ORDER — FENTANYL CITRATE (PF) 100 MCG/2ML IJ SOLN
INTRAMUSCULAR | Status: AC
  Filled 2021-08-07: qty 2

## 2021-08-07 MED ORDER — MIDAZOLAM HCL 2 MG/2ML IJ SOLN
INTRAMUSCULAR | Status: AC
  Filled 2021-08-07: qty 2

## 2021-08-07 MED ORDER — HEPARIN SODIUM (PORCINE) 1000 UNIT/ML IJ SOLN
INTRAMUSCULAR | Status: AC
  Filled 2021-08-07: qty 10

## 2021-08-07 MED ORDER — ACETAMINOPHEN 325 MG PO TABS: 650.0000 mg | ORAL_TABLET | ORAL | Status: DC | PRN

## 2021-08-07 MED ORDER — SODIUM CHLORIDE 0.9 % WEIGHT BASED INFUSION
3.0000 mL/kg/h | INTRAVENOUS | Status: AC
  Administered 2021-08-07: 3 mL/kg/h via INTRAVENOUS

## 2021-08-07 MED ORDER — VERAPAMIL HCL 2.5 MG/ML IV SOLN
INTRAVENOUS | Status: DC | PRN
  Administered 2021-08-07: 10 mL via INTRA_ARTERIAL

## 2021-08-07 MED ORDER — MIDAZOLAM HCL 2 MG/2ML IJ SOLN
INTRAMUSCULAR | Status: AC
Start: 2021-08-07 — End: ?
  Filled 2021-08-07: qty 2

## 2021-08-07 MED ORDER — MIDAZOLAM HCL 2 MG/2ML IJ SOLN
INTRAMUSCULAR | Status: DC | PRN
  Administered 2021-08-07 (×3): 1 mg via INTRAVENOUS

## 2021-08-07 MED ORDER — HEPARIN (PORCINE) IN NACL 1000-0.9 UT/500ML-% IV SOLN
INTRAVENOUS | Status: DC | PRN
  Administered 2021-08-07 (×2): 500 mL

## 2021-08-07 MED ORDER — SODIUM CHLORIDE 0.9 % IV SOLN: INTRAVENOUS | Status: DC

## 2021-08-07 MED ORDER — FENTANYL CITRATE (PF) 100 MCG/2ML IJ SOLN
INTRAMUSCULAR | Status: DC | PRN
  Administered 2021-08-07 (×3): 25 ug via INTRAVENOUS

## 2021-08-07 MED ORDER — LIDOCAINE HCL (PF) 1 % IJ SOLN
INTRAMUSCULAR | Status: DC | PRN
  Administered 2021-08-07 (×2): 2 mL

## 2021-08-07 MED ORDER — HYDRALAZINE HCL 20 MG/ML IJ SOLN: 10.0000 mg | INTRAMUSCULAR | Status: DC | PRN

## 2021-08-07 MED ORDER — HEPARIN SODIUM (PORCINE) 1000 UNIT/ML IJ SOLN
INTRAMUSCULAR | Status: DC | PRN
  Administered 2021-08-07: 5000 [IU] via INTRAVENOUS

## 2021-08-07 MED ORDER — IOHEXOL 350 MG/ML SOLN
INTRAVENOUS | Status: DC | PRN
  Administered 2021-08-07: 70 mL

## 2021-08-07 MED ORDER — SODIUM CHLORIDE 0.9 % WEIGHT BASED INFUSION: 1.0000 mL/kg/h | INTRAVENOUS | Status: DC

## 2021-08-07 MED ORDER — VERAPAMIL HCL 2.5 MG/ML IV SOLN
INTRAVENOUS | Status: AC
Start: 2021-08-07 — End: ?
  Filled 2021-08-07: qty 2

## 2021-08-07 SURGICAL SUPPLY — 16 items
BAND ZEPHYR COMPRESS 30 LONG (HEMOSTASIS) ×1 IMPLANT
CATH BALLN WEDGE 5F 110CM (CATHETERS) ×1 IMPLANT
CATH EXPO 5F MPA-1 (CATHETERS) ×1 IMPLANT
CATH INFINITI 5FR MULTPACK ANG (CATHETERS) ×1 IMPLANT
CATH INFINITI 6F AL1 (CATHETERS) ×1 IMPLANT
GLIDESHEATH SLEND SS 6F .021 (SHEATH) ×1 IMPLANT
GUIDEWIRE INQWIRE 1.5J.035X260 (WIRE) IMPLANT
INQWIRE 1.5J .035X260CM (WIRE) ×2
KIT HEART LEFT (KITS) ×2 IMPLANT
PACK CARDIAC CATHETERIZATION (CUSTOM PROCEDURE TRAY) ×2 IMPLANT
SHEATH GLIDE SLENDER 4/5FR (SHEATH) ×1 IMPLANT
SHEATH PROBE COVER 6X72 (BAG) ×1 IMPLANT
TRANSDUCER W/STOPCOCK (MISCELLANEOUS) ×2 IMPLANT
TUBING CIL FLEX 10 FLL-RA (TUBING) ×2 IMPLANT
TUBING CONTRAST HIGH PRESS 20 (MISCELLANEOUS) ×2 IMPLANT
WIRE EMERALD ST .035X150CM (WIRE) ×1 IMPLANT

## 2021-08-07 NOTE — Interval H&P Note (Signed)
History and Physical Interval Note: ? ?08/07/2021 ?12:42 PM ? ?Tonya Glass  has presented today for surgery, with the diagnosis of pvl.  The various methods of treatment have been discussed with the patient and family. After consideration of risks, benefits and other options for treatment, the patient has consented to  Procedure(s): ?RIGHT/LEFT HEART CATH AND CORONARY ANGIOGRAPHY (N/A) as a surgical intervention.  The patient's history has been reviewed, patient examined, no change in status, stable for surgery.  I have reviewed the patient's chart and labs.  Questions were answered to the patient's satisfaction.   ? ? ?Sherren Mocha ? ? ?

## 2021-08-08 ENCOUNTER — Encounter (HOSPITAL_COMMUNITY): Payer: Self-pay | Admitting: Cardiovascular Disease

## 2021-08-17 ENCOUNTER — Telehealth: Payer: Self-pay | Admitting: Cardiovascular Disease

## 2021-08-17 NOTE — Telephone Encounter (Signed)
Patient is calling upset that she has not heard from our office. She states Dr. Burt Knack advised her on 03/27 he would be speaking with Dr. Sallyanne Kuster in regards to her urgent procedure that could not be completed, but no one has contacted her. Please advise.  ?

## 2021-08-17 NOTE — Telephone Encounter (Signed)
Contacted patient, advised of message below. She states she had a cath and it was concerning to her that she has not heard from our office. I did advise patient I would send a message as I did not see anything in regards to [place] contacting her.  ?She would like to know if Dr.Cooper spoke with Dr.C in regards to this.  ? ?Will route to MD/RN.  ?Thanks! ? ?

## 2021-08-19 NOTE — Telephone Encounter (Signed)
Aware of the cath findings. Discussed w Dr. Burt Knack.  ?The cath as very useful in pointing [place] away from her valve problems as the cause of her shortness of breath and swelling. The pressures recorded at cath were more consistent lung cause for the symptoms (COPD from years of smoking for example) with secondary right heart failure. Closing the leak will not help this. ?Plan to discuss further ta her next appointment. Does she have apulmonary specialist that she is seeing? ?

## 2021-08-21 NOTE — Telephone Encounter (Signed)
Called and spoke with the patient. She asked that we call her daughter, Eustaquio Maize.  ? ?Spoke with [employer]. A sooner appointment has been made to discuss the findings.  ? ? ?

## 2021-08-25 ENCOUNTER — Encounter: Payer: Medicare Other | Admitting: Cardiovascular Disease

## 2021-09-11 ENCOUNTER — Encounter: Payer: Medicare Other | Admitting: Cardiovascular Disease

## 2021-09-11 ENCOUNTER — Encounter: Payer: Self-pay | Admitting: Cardiovascular Disease

## 2021-09-11 ENCOUNTER — Ambulatory Visit (INDEPENDENT_AMBULATORY_CARE_PROVIDER_SITE_OTHER): Payer: Medicare Other | Admitting: Cardiovascular Disease

## 2021-09-11 VITALS — BP 88/45 | HR 60 | Ht 60.0 in | Wt 204.2 lb

## 2021-09-11 DIAGNOSIS — D649 Anemia, unspecified: Secondary | ICD-10-CM

## 2021-09-11 DIAGNOSIS — I2510 Atherosclerotic heart disease of native coronary artery without angina pectoris: Secondary | ICD-10-CM

## 2021-09-11 DIAGNOSIS — I5032 Chronic diastolic (congestive) heart failure: Secondary | ICD-10-CM | POA: Diagnosis not present

## 2021-09-11 DIAGNOSIS — Z953 Presence of xenogenic heart valve: Secondary | ICD-10-CM

## 2021-09-11 DIAGNOSIS — I351 Nonrheumatic aortic (valve) insufficiency: Secondary | ICD-10-CM

## 2021-09-11 DIAGNOSIS — E785 Hyperlipidemia, unspecified: Secondary | ICD-10-CM

## 2021-09-11 DIAGNOSIS — L97922 Non-pressure chronic ulcer of unspecified part of left lower leg with fat layer exposed: Secondary | ICD-10-CM

## 2021-09-11 DIAGNOSIS — I2721 Secondary pulmonary arterial hypertension: Secondary | ICD-10-CM | POA: Diagnosis not present

## 2021-09-11 DIAGNOSIS — L97912 Non-pressure chronic ulcer of unspecified part of right lower leg with fat layer exposed: Secondary | ICD-10-CM

## 2021-09-11 DIAGNOSIS — I441 Atrioventricular block, second degree: Secondary | ICD-10-CM

## 2021-09-11 DIAGNOSIS — I2781 Cor pulmonale (chronic): Secondary | ICD-10-CM | POA: Diagnosis not present

## 2021-09-11 DIAGNOSIS — Z950 Presence of cardiac pacemaker: Secondary | ICD-10-CM

## 2021-09-11 DIAGNOSIS — J418 Mixed simple and mucopurulent chronic bronchitis: Secondary | ICD-10-CM

## 2021-09-11 DIAGNOSIS — G4710 Hypersomnia, unspecified: Secondary | ICD-10-CM

## 2021-09-11 DIAGNOSIS — I50812 Chronic right heart failure: Secondary | ICD-10-CM

## 2021-09-11 DIAGNOSIS — E039 Hypothyroidism, unspecified: Secondary | ICD-10-CM

## 2021-09-11 DIAGNOSIS — I10 Essential (primary) hypertension: Secondary | ICD-10-CM

## 2021-09-11 DIAGNOSIS — Z9889 Other specified postprocedural states: Secondary | ICD-10-CM

## 2021-09-11 DIAGNOSIS — D6869 Other thrombophilia: Secondary | ICD-10-CM

## 2021-09-11 DIAGNOSIS — I495 Sick sinus syndrome: Secondary | ICD-10-CM

## 2021-09-11 DIAGNOSIS — I484 Atypical atrial flutter: Secondary | ICD-10-CM

## 2021-09-11 DIAGNOSIS — I872 Venous insufficiency (chronic) (peripheral): Secondary | ICD-10-CM

## 2021-09-11 DIAGNOSIS — N1832 Chronic kidney disease, stage 3b: Secondary | ICD-10-CM

## 2021-09-11 DIAGNOSIS — E1122 Type 2 diabetes mellitus with diabetic chronic kidney disease: Secondary | ICD-10-CM

## 2021-09-11 MED ORDER — FUROSEMIDE 80 MG PO TABS: 80.0000 mg | ORAL_TABLET | Freq: Two times a day (BID) | ORAL | 1 refills | Status: DC

## 2021-09-11 MED ORDER — OLMESARTAN MEDOXOMIL 20 MG PO TABS: 10.0000 mg | ORAL_TABLET | Freq: Every day | ORAL | 1 refills | Status: DC

## 2021-09-11 MED ORDER — FUROSEMIDE 40 MG PO TABS: 40.0000 mg | ORAL_TABLET | Freq: Every day | ORAL | 0 refills | Status: DC

## 2021-09-11 NOTE — Patient Instructions (Signed)
Medication Instructions:  ?DECREASE the Olmesartan to 10 mg once daily (this is half of the 20 mg tablet) ? ?INCREASE the Furosemide to 80 mg twice daily. A separate prescription has been sent in for 40 mg once daily-this is a 30 day supply only to go along with your old prescription until your new one arrives in the mail.  ? ?*If you need a refill on your cardiac medications before your next appointment, please call your pharmacy* ? ? ?Lab Work: ?Your provider would like for you to return in 2 weeks to have the following labs drawn: BMET. You do not need an appointment for the lab. Once in our office lobby there is a podium where you can sign in and ring the doorbell to alert [place] that you are here. The lab is open from 8:00 am to 4 pm; closed for lunch from 12:45pm-1:45pm. ? ?You may also go to any of these [facility] locations: ?  ?[place] ?- [place] ([facility]) ?- [address] ?- [place] ? ?If you have labs (blood work) drawn today and your tests are completely normal, you will receive your results only by: ?MyChart Message (if you have MyChart) OR ?A paper copy in the mail ?If you have any lab test that is abnormal or we need to change your treatment, we will call you to review the results. ? ? ?Testing/Procedures: ?Your physician has recommended that you have a sleep study. This test records several body functions during sleep, including: brain activity, eye movement, oxygen and carbon dioxide blood levels, heart rate and rhythm, breathing rate and rhythm, the flow of air through your mouth and nose, snoring, body muscle movements, and chest and belly movement. ? ?They will call you to set this up. ? ? ?Follow-Up: ?At [facility], you and your health needs are our priority.  As part of our continuing mission to provide you with exceptional heart care, we have created designated Provider Care Teams.  These Care Teams include your primary  Cardiologist (physician) and Advanced Practice Providers (APPs -  Physician Assistants and Nurse Practitioners) who all work together to provide you with the care you need, when you need it. ? ?We recommend signing up for the patient portal called "MyChart".  Sign up information is provided on this After Visit Summary.  MyChart is used to connect with patients for Virtual Visits (Telemedicine).  Patients are able to view lab/test results, encounter notes, upcoming appointments, etc.  Non-urgent messages can be sent to your provider as well.   ?To learn more about what you can do with MyChart, go to [email or web address].   ? ?Your next appointment:   ?2 month(s) on a device day ? ?The format for your next appointment:   ?In Person ? ?Provider:   ?Dr. Sallyanne Kuster ? ? ?Other Instructions ?A referral has been placed to the wound center.  ?A referral has been placed to Pulmonology ? ? ?

## 2021-09-11 NOTE — Progress Notes (Addendum)
? ?Cardiology Office Note   ? ?Date:  09/11/2021  ? ?ID:  Tonya Glass, DOB February 10, 1946, MRN [id number] ? ?PCP:  Sonia Side., FNP  ?Cardiologist:  Kathy Breach, M.D.; Sanda Klein, MD  ? ?Chief complaint: CHF, aortic insufficiency ? ?History of Present Illness:  ?Tonya Glass is a 76 y.o. 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  She had paroxysmal atrial flutter and underwent successful overdrive pacing in 4098, with recurrence in January 2022 that resolved spontaneously.  She has minor CAD by angiography (40% mid LAD pre-AVR 2014; mild diffuse nonobstructive irregularity by angiography 2023).     ? ?She has known paravalvular aortic insufficiency ever since prosthetic valve implantation, estimated to be moderate by TEE performed 06/13/2021.  Mitral regurgitation, which appeared moderate-severe by transthoracic echo, was much less significant at the time of the TEE, suggesting a secondary/functional mechanism.  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 ? ?She is back to the office today, accompanied by daughter, to discuss further management of heart failure.  She continues to have significant lower extremity  edema with bilateral weeping open ulcerations.  If she tries to use compression stockings that the ulcerated skin tears off when she takes them off.  She tries to keep her legs elevated is much as possible.  She does not have overt orthopnea, but prefers sleeping in a recliner to keep her legs elevated and due to a remote history of injury to her coccyx.  She does not have PND but describes herself as having poor sleep. ? ?She has significant daytime hypersomnolence.  She has dropped a cup from her hand because she fell asleep.  She cannot finish watching the movie or reading more than a few words in a book before falling asleep.  She takes naps all day long.  She feels tired all the time.  Scored 9 on the Epworth score today and checked the boxes on every single item on the review of systems for sleep apnea associated complaints.  She has numerous family members including her son and her daughter who have obstructive sleep apnea. ? ?She has not had any further falls or injuries.  She denies palpitations or syncope.  She has not had any chest pain.  She has a productive cough, especially prominent when she gets out of bed in the morning.  She is currently using only beta-1 agonist bronchodilators, tried to take Trelegy but was unable to deal with the powder delivery system. ? ?Presenting rhythm today was AV sequential pacing.  She has 84% atrial pacing and 84% ventricular pacing and there has been no atrial fibrillation since November 2022 at her last in office device check.  She has rare episodes of very brief nonsustained VT (only 1 episode in the last 3 months, 6 episodes in the last 6  months). ? ?TEE, CT angiography and aortic angiography also showed moderate aortic insufficiency with a perivalvular leak in the vicinity of the left coronary ostium. ? ?Most recent creatinine was 2.0 on 07/28/2021, potassium was 4.2 on 08/07/2021.  Baseline creatinine is estimated to be 1.25.  Baseline BNP is probably around  500. ? ? ? ?Past Medical History:  ?Diagnosis Date  ? Anxiety   ? Aortic valve stenosis 10/10/2016  ? Arthritis   ? "fingers" (07/24/2017)  ? CHF (congestive heart failure) ([place])   ? COPD (chronic obstructive pulmonary disease) ([place])   ? Depression   ? Gallstones   ? Heart murmur   ? High cholesterol   ? History of kidney stones   ? Hypertension   ? Hypothyroidism   ? Migraines   ? "none in years til 01/2013-02/2013; none since"  (07/24/2017)  ? Orthopnoea   ? "just since open heart surgery" (02/25/2013)  ? Pacemaker   ? [place]   ? Pneumonia ~ 2012  ? "walking pneumonia"  ? Pneumonia 07/23/2017  ? Stroke [facility]) 2001  ? denies residual on 02/25/2013  ? Symptomatic hypotension 10/10/2016  ? Syncope and collapse   ? "prior to open heart surgery" (02/25/2013)  ? Type II diabetes mellitus ([place])   ? ? ?Past Surgical History:  ?Procedure Laterality Date  ? AORTIC ARCH ANGIOGRAPHY N/A 08/07/2021  ? Procedure: AORTIC ARCH ANGIOGRAPHY;  Surgeon: Sherren Mocha, MD;  Location: [place] [facility];  Service: Cardiovascular;  Laterality: N/A;  ? BREAST SURGERY Right   ? "clamped blood vessel and stitched it up; in [place]"  ? CARDIAC CATHETERIZATION    ? CARDIAC VALVE REPLACEMENT  01/26/2013  ? "cow valve put in; widened the second valve" (02/25/2013) - [place], [place]  ? CATARACT EXTRACTION W/ INTRAOCULAR LENS  IMPLANT, BILATERAL Bilateral 04/2017  ? CHOLECYSTECTOMY OPEN  1971  ? CYST EXCISION    ? "taken off my chest"  ? INSERT / REPLACE / REMOVE PACEMAKER  01/19/2013  ? [employer] - dual chamber  ? LAPAROSCOPIC GASTRIC SLEEVE RESECTION  2009  ? in [place]  ? RIGHT/LEFT HEART CATH AND CORONARY ANGIOGRAPHY N/A 08/07/2021  ? Procedure: RIGHT/LEFT HEART CATH AND CORONARY ANGIOGRAPHY;  Surgeon: Sherren Mocha, MD;  Location: [place] [facility];  Service: Cardiovascular;  Laterality: N/A;  ? TEE WITHOUT CARDIOVERSION N/A 06/13/2021  ? Procedure: TRANSESOPHAGEAL ECHOCARDIOGRAM (TEE);  Surgeon: Sanda Klein, MD;  Location: [place];  Service: Cardiovascular;  Laterality: N/A;  ? VEIN SURGERY Left   ? LLE; cut leg open and took out piece of vein that was protruding thru skin; stitched it up; done in [place]  ? ? ?Current Medications: ?Outpatient Medications Prior to Visit  ?Medication Sig Dispense Refill  ? albuterol (PROVENTIL HFA;VENTOLIN HFA) 108 (90 BASE) MCG/ACT inhaler Inhale 2 puffs into the lungs every 6 (six) hours as needed for wheezing or shortness of breath. Use as directed -  With exercise    ? alendronate (FOSAMAX) 70 MG tablet Take 70 mg by mouth every Sunday.    ? amLODipine (NORVASC) 2.5 MG tablet Take 2.5 mg every night 90 tablet 3  ? atorvastatin (LIPITOR) 40 MG tablet Take 40 mg by mouth daily.    ? azelastine (ASTELIN) 0.1 % nasal spray Place 2 sprays into both nostrils 2 (two) times daily.    ? carvedilol (COREG) 6.25 MG tablet Take 1 tablet (6.25 mg total) by mouth 2 (two) times daily. 180 tablet 3  ? cloNIDine (  CATAPRES) 0.2 MG tablet Take 1 tablet (0.2 mg total) by mouth at bedtime. 30 tablet 11  ? dapagliflozin propanediol (FARXIGA) 10 MG TABS tablet Take 1 tablet (10 mg total) by mouth daily before breakfast. 30 tablet 4  ? ELIQUIS 5 MG TABS tablet Take 1 tablet (5 mg total) by mouth 2 (two) times daily. 180 tablet 1  ? FLUoxetine (PROZAC) 40 MG capsule Take 40 mg by mouth daily.     ? gabapentin (NEURONTIN) 300 MG capsule Take 600 mg by mouth at bedtime.    ? glipiZIDE (GLUCOTROL) 10 MG tablet Take 10 mg by mouth 2 (two) times daily before a meal.    ? glucose blood test strip 1 each by Other route. Use as instructed    ? hydrocortisone 2.5 % cream Apply 1 application topically daily as needed (dry skin).     ? levothyroxine (SYNTHROID, LEVOTHROID) 50 MCG tablet Take 50 mcg by mouth daily before breakfast.    ? magnesium oxide (MAG-OX) 400 (241.3 Mg) MG tablet Take 400 mg by mouth daily.    ? montelukast (SINGULAIR) 10 MG tablet Take 10 mg by mouth at bedtime.    ? nystatin ointment (MYCOSTATIN) Apply 1  application topically daily as needed (rash). 100,000- units/gram    ? temazepam (RESTORIL) 30 MG capsule Take 30 mg by mouth at bedtime.    ? furosemide (LASIX) 40 MG tablet Take 2 tablets (80 mg) every morni

## 2021-09-12 DIAGNOSIS — I872 Venous insufficiency (chronic) (peripheral): Secondary | ICD-10-CM | POA: Insufficient documentation

## 2021-09-12 DIAGNOSIS — I2721 Secondary pulmonary arterial hypertension: Secondary | ICD-10-CM | POA: Insufficient documentation

## 2021-09-12 DIAGNOSIS — J418 Mixed simple and mucopurulent chronic bronchitis: Secondary | ICD-10-CM | POA: Insufficient documentation

## 2021-09-12 DIAGNOSIS — I2781 Cor pulmonale (chronic): Secondary | ICD-10-CM | POA: Insufficient documentation

## 2021-09-12 DIAGNOSIS — N1832 Chronic kidney disease, stage 3b: Secondary | ICD-10-CM | POA: Insufficient documentation

## 2021-09-20 ENCOUNTER — Encounter (HOSPITAL_BASED_OUTPATIENT_CLINIC_OR_DEPARTMENT_OTHER): Payer: Medicare Other | Attending: Physician Assistant | Admitting: Physician Assistant

## 2021-09-20 DIAGNOSIS — I509 Heart failure, unspecified: Secondary | ICD-10-CM | POA: Insufficient documentation

## 2021-09-20 DIAGNOSIS — Z952 Presence of prosthetic heart valve: Secondary | ICD-10-CM | POA: Insufficient documentation

## 2021-09-20 DIAGNOSIS — I890 Lymphedema, not elsewhere classified: Secondary | ICD-10-CM | POA: Insufficient documentation

## 2021-09-20 DIAGNOSIS — L89320 Pressure ulcer of left buttock, unstageable: Secondary | ICD-10-CM | POA: Diagnosis not present

## 2021-09-20 DIAGNOSIS — Z950 Presence of cardiac pacemaker: Secondary | ICD-10-CM | POA: Diagnosis not present

## 2021-09-20 DIAGNOSIS — I480 Paroxysmal atrial fibrillation: Secondary | ICD-10-CM | POA: Diagnosis not present

## 2021-09-20 DIAGNOSIS — Z7901 Long term (current) use of anticoagulants: Secondary | ICD-10-CM | POA: Insufficient documentation

## 2021-09-20 DIAGNOSIS — L97822 Non-pressure chronic ulcer of other part of left lower leg with fat layer exposed: Secondary | ICD-10-CM | POA: Insufficient documentation

## 2021-09-20 DIAGNOSIS — E11622 Type 2 diabetes mellitus with other skin ulcer: Secondary | ICD-10-CM | POA: Insufficient documentation

## 2021-09-20 DIAGNOSIS — I110 Hypertensive heart disease with heart failure: Secondary | ICD-10-CM | POA: Insufficient documentation

## 2021-09-20 DIAGNOSIS — L97812 Non-pressure chronic ulcer of other part of right lower leg with fat layer exposed: Secondary | ICD-10-CM | POA: Insufficient documentation

## 2021-09-25 ENCOUNTER — Other Ambulatory Visit (HOSPITAL_COMMUNITY): Payer: Self-pay | Admitting: Physician Assistant

## 2021-09-25 ENCOUNTER — Ambulatory Visit (HOSPITAL_COMMUNITY)
Admission: RE | Admit: 2021-09-25 | Discharge: 2021-09-25 | Disposition: A | Discharge status: Home / Self Care | Payer: Medicare Other | Source: Ambulatory Visit | Attending: Physician Assistant | Admitting: Physician Assistant

## 2021-09-25 ENCOUNTER — Ambulatory Visit (INDEPENDENT_AMBULATORY_CARE_PROVIDER_SITE_OTHER)
Admission: RE | Admit: 2021-09-25 | Discharge: 2021-09-25 | Disposition: A | Discharge status: Home / Self Care | Payer: Medicare Other | Source: Ambulatory Visit | Attending: Physician Assistant | Admitting: Physician Assistant

## 2021-09-25 DIAGNOSIS — I739 Peripheral vascular disease, unspecified: Secondary | ICD-10-CM

## 2021-09-27 ENCOUNTER — Encounter (HOSPITAL_BASED_OUTPATIENT_CLINIC_OR_DEPARTMENT_OTHER): Payer: Medicare Other | Admitting: Physician Assistant

## 2021-09-27 DIAGNOSIS — E11622 Type 2 diabetes mellitus with other skin ulcer: Secondary | ICD-10-CM | POA: Diagnosis not present

## 2021-09-27 NOTE — Progress Notes (Addendum)
Tonya Glass ([id number]) Visit Report for 09/27/2021 Chief Complaint Document Details Patient Name: Date of Service: Tonya Glass, Tonya Glass 09/27/2021 3:15 PM Medical Record Number: [id number] Patient Account Number: [id number] Date of Birth/Sex: Treating RN: 11-14-1945 (76 y.o. Tonya Glass, Tonya Glass Primary Care Provider: Dustin Glass Other Clinician: Referring Provider: Treating Provider/Extender: Tonya Glass in Treatment: 1 Information Obtained from: Patient Chief Complaint Bilateral LE Ulcers and left gluteal ulcer Electronic Signature(s) Signed: 09/27/2021 3:02:22 PM By: Tonya Keeler PA-C Entered By: Tonya Glass on 09/27/2021 15:02:22 -------------------------------------------------------------------------------- HPI Details Patient Name: Date of Service: Tonya Glass, Tonya Glass 09/27/2021 3:15 PM Medical Record Number: [id number] Patient Account Number: [id number] Date of Birth/Sex: Treating RN: [birth year] (76 y.o. Tonya Glass, Tonya Glass Primary Care Provider: Dustin Glass Other Clinician: Referring Provider: Treating Provider/Extender: Tonya Glass in Treatment: 1 History of Present Illness HPI Description: 09-22-2021 upon evaluation today patient presents with multiple lower extremity ulcerations of the bilateral lower extremities which have been present for a little over a month in general for most of these although some are new or. Most began around mid April time. Nonetheless the patient does have lymphedema which I think is contributing to the issues here. Fortunately there does not appear to be any evidence of infection at this time which is great news. The patient does have wounds over again both lower extremities. There is a history of lymphedema, peripheral vascular disease, atrial fibrillation, hypertension, long-term use of anticoagulant therapy, a cardiac pacemaker, and the patient has had a heart valve replacement. 09-27-2021 upon  evaluation today patient appears to be doing better for the most part in regard to most of her wounds. She has been tolerating the dressing changes without complication. Fortunately I do not see any evidence of active infection locally nor systemically which is great news. No fevers, chills, nausea, vomiting, or diarrhea. With that being said the patient does have arterial studies to review today her left ABI was 0.69 with a right ABI of 0.79. On the TBI of the left foot 0.11 with the right 0.79. On the right I am not too worried about her blood flow based on the results of the study but on the left this has been a little bit more concerned. She also tells me that she feels very weak when she is get up and move around symptoms that seem consistent with claudication type experiences. Nonetheless I do believe it may be a good idea for her to have an evaluation with vascular in order to discuss these findings and see if there is anything they would recommend going forward for her. Overall the wounds on the left have almost completely healed and very pleased in that regard the right are little bit more open but nothing too significant the biggest wound is actually on her gluteal region and I think that is can be something that we need to continue to work on here again right now they do not have the dressings that were needed in order to keep this moving in the right direction. Electronic Signature(s) Signed: 09/27/2021 4:10:15 PM By: Tonya Keeler PA-C Entered By: Tonya Glass on 09/27/2021 16:10:15 -------------------------------------------------------------------------------- Physical Exam Details Patient Name: Date of Service: Tonya Glass, Tonya Glass 09/27/2021 3:15 PM Medical Record Number: [id number] Patient Account Number: [id number] Date of Birth/Sex: Treating RN: February 17, 1946 (76 y.o. Tonya Glass, Tonya Glass Primary Care Provider: Dustin Glass Other Clinician: Referring Provider: Treating  Provider/Extender: Tonya Glass, Tonya Glass  in Treatment: 1 Constitutional Well-nourished and well-hydrated in no acute distress. Respiratory normal breathing without difficulty. Psychiatric this patient is able to make decisions and demonstrates good insight into disease process. Alert and Oriented x 3. pleasant and cooperative. Notes Patient's wounds on the left leg actually appear to be doing excellent on the right were seeing signs of improvement as well and overall very pleased with where we stand currently. Electronic Signature(s) Signed: 09/27/2021 4:14:35 PM By: Tonya Keeler PA-C Entered By: Tonya Glass on 09/27/2021 16:14:35 -------------------------------------------------------------------------------- Physician Orders Details Patient Name: Date of Service: Tonya Glass, Tonya Glass 09/27/2021 3:15 PM Medical Record Number: [id number] Patient Account Number: [id number] Date of Birth/Sex: Treating RN: [birth year] (76 y.o. Tonya Glass, [place] Primary Care Provider: Dustin Glass Other Clinician: Referring Provider: Treating Provider/Extender: Tonya Glass in Treatment: 1 Verbal / Phone Orders: No Diagnosis Coding ICD-10 Coding Code Description I89.0 Lymphedema, not elsewhere classified I73.89 Other specified peripheral vascular diseases L97.822 Non-pressure chronic ulcer of other part of left lower leg with fat layer exposed L97.812 Non-pressure chronic ulcer of other part of right lower leg with fat layer exposed L89.320 Pressure ulcer of left buttock, unstageable I48.0 Paroxysmal atrial fibrillation I10 Essential (primary) hypertension Z79.01 Long term (current) use of anticoagulants 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 Follow-up Appointments ppointment in 1 week. - w/ Jeri Cos, PA and [place] # 9 Return A Other: - Vascular consult [facility]ed [facility]  they will call you to schedule Bathing/  Shower/ Hygiene May shower and wash wound with soap and water. - with dressings. Edema Control - Lymphedema / SCD / Other Elevate legs to the level of the heart or above for 30 minutes daily and/or when sitting, a frequency of: - 3-4 times a day throughout the day. Avoid standing for long periods of time. Off-Loading Turn and reposition every 2 hours Other: - ensure to get up to stand and walk to limit pressure to buttock wound. Wound Treatment Wound #1 - Lower Leg Wound Laterality: Left, Medial, Distal Cleanser: Soap and Water 3 x Per Week/30 Days Discharge Instructions: May shower and wash wound with dial antibacterial soap and water prior to dressing change. Prim Dressing: KerraCel Ag Gelling Fiber Dressing, 2x2 in (silver alginate) (Generic) 3 x Per Week/30 Days ary Discharge Instructions: Apply silver alginate to wound bed as instructed Secondary Dressing: ABD Pad, 5x9 (Generic) 3 x Per Week/30 Days Discharge Instructions: Apply over primary dressing as directed. Secondary Dressing: Woven Gauze Sponge, Non-Sterile 4x4 in (Generic) 3 x Per Week/30 Days Discharge Instructions: Apply over primary dressing as directed. Secured With: Elastic Bandage 4 inch (ACE bandage) (Generic) 3 x Per Week/30 Days Discharge Instructions: Secure with ACE bandage as directed. Secured With: [employer], 4.5x3.1 (in/yd) (Generic) 3 x Per Week/30 Days Discharge Instructions: Secure with Kerlix as directed. Secured With: 8M Medipore H Soft Cloth Surgical T ape, 4 x 10 (in/yd) (Generic) 3 x Per Week/30 Days Discharge Instructions: Secure with tape as directed. Wound #3 - Lower Leg Wound Laterality: Right, Medial, Proximal Cleanser: Soap and Water 3 x Per Week/30 Days Discharge Instructions: May shower and wash wound with dial antibacterial soap and water prior to dressing change. Prim Dressing: betadine 3 x Per Week/30 Days ary Discharge Instructions: purchase over the counter and apply directly to  wound. Secondary Dressing: ABD Pad, 5x9 (Generic) 3 x Per Week/30 Days Discharge Instructions: Apply over primary dressing as directed. Secured With: Secondary school teacher  4 inch (ACE bandage) (Generic) 3 x Per Week/30 Days Discharge Instructions: Secure with ACE bandage as directed. Secured With: [employer], 4.5x3.1 (in/yd) (Generic) 3 x Per Week/30 Days Discharge Instructions: Secure with Kerlix as directed. Secured With: 52M Medipore H Soft Cloth Surgical T ape, 4 x 10 (in/yd) (Generic) 3 x Per Week/30 Days Discharge Instructions: Secure with tape as directed. Wound #5 - Lower Leg Wound Laterality: Right, Lateral Cleanser: Soap and Water 3 x Per Week/30 Days Discharge Instructions: May shower and wash wound with dial antibacterial soap and water prior to dressing change. Prim Dressing: KerraCel Ag Gelling Fiber Dressing, 2x2 in (silver alginate) (Generic) 3 x Per Week/30 Days ary Discharge Instructions: Apply silver alginate to wound bed as instructed Secondary Dressing: ABD Pad, 5x9 (Generic) 3 x Per Week/30 Days Discharge Instructions: Apply over primary dressing as directed. Secondary Dressing: Woven Gauze Sponge, Non-Sterile 4x4 in (Generic) 3 x Per Week/30 Days Discharge Instructions: Apply over primary dressing as directed. Secured With: Elastic Bandage 4 inch (ACE bandage) (Generic) 3 x Per Week/30 Days Discharge Instructions: Secure with ACE bandage as directed. Secured With: [employer], 4.5x3.1 (in/yd) (Generic) 3 x Per Week/30 Days Discharge Instructions: Secure with Kerlix as directed. Secured With: 52M Medipore H Soft Cloth Surgical T ape, 4 x 10 (in/yd) (Generic) 3 x Per Week/30 Days Discharge Instructions: Secure with tape as directed. Wound #6 - Gluteus Wound Laterality: Left Cleanser: Soap and Water Every Other Day/30 Days Discharge Instructions: May shower and wash wound with dial antibacterial soap and water prior to dressing change. Peri-Wound Care: Skin  Prep (Generic) Every Other Day/30 Days Discharge Instructions: Use skin prep as directed Prim Dressing: KerraCel Ag Gelling Fiber Dressing, 2x2 in (silver alginate) (Generic) Every Other Day/30 Days ary Discharge Instructions: Apply silver alginate to wound bed as instructed Secondary Dressing: Zetuvit Plus Silicone Border Dressing 4x4 (in/in) (Generic) Every Other Day/30 Days Discharge Instructions: Apply silicone border over primary dressing as directed. Electronic Signature(s) Signed: 09/27/2021 4:18:49 PM By: Tonya Keeler PA-C Signed: 09/28/2021 4:40:18 PM By: Rhae Hammock RN Entered By: Rhae Hammock on 09/27/2021 15:50:46 -------------------------------------------------------------------------------- Problem List Details Patient Name: Date of Service: Tonya Glass, Tonya Glass 09/27/2021 3:15 PM Medical Record Number: [id number] Patient Account Number: [id number] Date of Birth/Sex: Treating RN: November 15, 1945 (76 y.o. 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/02/2022 No Yes L97.812 Non-pressure chronic ulcer of other part of right lower leg with fat layer 09/20/2021 No Yes exposed L89.320 Pressure ulcer of left buttock, unstageable 09/20/2021 No Yes I48.0 Paroxysmal atrial fibrillation 09/20/2021 No Yes I10 Essential (primary) hypertension 09/20/2021 No Yes Z79.01 Long term (current) use of anticoagulants 09/20/2021 No Yes Z95.0 Presence of cardiac pacemaker 09/20/2021 No Yes Z95.4 Presence of other heart-valve replacement 09/20/2021 No Yes Inactive  Problems Resolved Problems Electronic Signature(s) Signed: 09/27/2021 3:02:11 PM By: Tonya Keeler PA-C Entered By: Tonya Glass on 09/27/2021 15:02:11 -------------------------------------------------------------------------------- Progress Note Details Patient Name: Date of Service: Tonya Glass, Tonya Glass 09/27/2021 3:15 PM Medical Record Number: [id number] Patient Account Number: [id number] Date of Birth/Sex: Treating RN: 07/19/1945 (76 y.o. Tonya Glass, Tonya Glass Primary Care Provider: Dustin Glass Other Clinician: Referring Provider:  Treating Provider/Extender: Vaughan Sine Weeks in Treatment: 1 Subjective Chief Complaint Information obtained from Patient Bilateral LE Ulcers and left gluteal ulcer History of Present Illness (HPI) 09-22-2021 upon evaluation today patient presents with multiple lower extremity ulcerations of the bilateral lower extremities which have been present for a little over a month in general for most of these although some are new or. Most began around mid April time. Nonetheless the patient does have lymphedema which I think is contributing to the issues here. Fortunately there does not appear to be any evidence of infection at this time which is great news. The patient does have wounds over again both lower extremities. There is a history of lymphedema, peripheral vascular disease, atrial fibrillation, hypertension, long-term use of anticoagulant therapy, a cardiac pacemaker, and the patient has had a heart valve replacement. 09-27-2021 upon evaluation today patient appears to be doing better for the most part in regard to most of her wounds. She has been tolerating the dressing changes without complication. Fortunately I do not see any evidence of active infection locally nor systemically which is great news. No fevers, chills, nausea, vomiting, or diarrhea. With that being said the patient does have arterial studies to review today her left ABI was  0.69 with a right ABI of 0.79. On the TBI of the left foot 0.11 with the right 0.79. On the right I am not too worried about her blood flow based on the results of the study but on the left this has been a little bit more concerned. She also tells me that she feels very weak when she is get up and move around symptoms that seem consistent with claudication type experiences. Nonetheless I do believe it may be a good idea for her to have an evaluation with vascular in order to discuss these findings and see if there is anything they would recommend going forward for her. Overall the wounds on the left have almost completely healed and very pleased in that regard the right are little bit more open but nothing too significant the biggest wound is actually on her gluteal region and I think that is can be something that we need to continue to work on here again right now they do not have the dressings that were needed in order to keep this moving in the right direction. Objective Constitutional Well-nourished and well-hydrated in no acute distress. Vitals Time Taken: 3:10 PM, Height: 60 in, Weight: 202 lbs, BMI: 39.4, Temperature: 98.3 F, Pulse: 60 bpm, Respiratory Rate: 18 breaths/min, Blood Pressure: 118/70 mmHg. Respiratory normal breathing without difficulty. Psychiatric this patient is able to make decisions and demonstrates good insight into disease process. Alert and Oriented x 3. pleasant and cooperative. General Notes: Patient's wounds on the left leg actually appear to be doing excellent on the right were seeing signs of improvement as well and overall very pleased with where we stand currently. Integumentary (Hair, Skin) Wound #1 status is Open. Original cause of wound was Blister. The date acquired was: 08/21/2021. The wound has been in treatment 1 weeks. The wound is located on the Left,Distal,Medial Lower Leg. The wound measures 1cm length x 2cm width x 0.1cm depth; 1.571cm^2 area and  0.157cm^3 volume. There is Fat Layer (Subcutaneous Tissue) exposed. There is no tunneling or undermining noted. There is a medium amount of serosanguineous drainage noted. The wound margin is distinct with the outline attached to the wound base. There is large (67-100%) pink, pale granulation within the wound  bed. There is no necrotic tissue within the wound bed. Wound #2 status is Healed - Epithelialized. Original cause of wound was Blister. The date acquired was: 08/21/2021. The wound has been in treatment 1 weeks. The wound is located on the Left,Proximal,Medial Lower Leg. The wound measures 0cm length x 0cm width x 0cm depth; 0cm^2 area and 0cm^3 volume. Wound #3 status is Open. Original cause of wound was Blister. The date acquired was: 08/21/2021. The wound has been in treatment 1 weeks. The wound is located on the Right,Proximal,Medial Lower Leg. The wound measures 0.3cm length x 0.3cm width x 0.1cm depth; 0.071cm^2 area and 0.007cm^3 volume. There is Fat Layer (Subcutaneous Tissue) exposed. There is no tunneling or undermining noted. There is a medium amount of serosanguineous drainage noted. The wound margin is distinct with the outline attached to the wound base. There is no granulation within the wound bed. There is a large (67-100%) amount of necrotic tissue within the wound bed including Eschar. Wound #4 status is Healed - Epithelialized. Original cause of wound was Gradually Appeared. The date acquired was: 08/21/2021. The wound has been in treatment 1 weeks. The wound is located on the Right,Distal,Medial Lower Leg. The wound measures 0cm length x 0cm width x 0cm depth; 0cm^2 area and 0cm^3 volume. There is Fat Layer (Subcutaneous Tissue) exposed. There is no tunneling or undermining noted. There is a medium amount of serosanguineous drainage noted. The wound margin is distinct with the outline attached to the wound base. There is small (1-33%) red granulation within the wound bed. There is  a large (67-100%) amount of necrotic tissue within the wound bed including Eschar. Wound #5 status is Open. Original cause of wound was Blister. The date acquired was: 09/15/2021. The wound has been in treatment 1 weeks. The wound is located on the Right,Lateral Lower Leg. The wound measures 3.8cm length x 4cm width x 0.1cm depth; 11.938cm^2 area and 1.194cm^3 volume. There is Fat Layer (Subcutaneous Tissue) exposed. There is no tunneling or undermining noted. There is a medium amount of sanguinous drainage noted. The wound margin is distinct with the outline attached to the wound base. There is large (67-100%) pink, pale, friable granulation within the wound bed. There is no necrotic tissue within the wound bed. Wound #6 status is Open. Original cause of wound was Pressure Injury. The date acquired was: 08/29/2021. The wound has been in treatment 1 weeks. The wound is located on the Left Gluteus. The wound measures 3cm length x 2cm width x 0.2cm depth; 4.712cm^2 area and 0.942cm^3 volume. There is Fat Layer (Subcutaneous Tissue) exposed. There is no tunneling or undermining noted. There is a medium amount of serosanguineous drainage noted. The wound margin is distinct with the outline attached to the wound base. There is small (1-33%) red granulation within the wound bed. There is a large (67-100%) amount of necrotic tissue within the wound bed including Adherent Slough. Wound #7 status is Open. Original cause of wound was Blister. The date acquired was: 09/27/2021. The wound is located on the Right,Posterior Lower Leg. The wound measures 1.5cm length x 1.5cm width x 0.1cm depth; 1.767cm^2 area and 0.177cm^3 volume. There is Fat Layer (Subcutaneous Tissue) exposed. There is no tunneling or undermining noted. There is a medium amount of sanguinous drainage noted. The wound margin is distinct with the outline attached to the wound base. There is large (67-100%) red, pink granulation within the wound bed.  There is no necrotic tissue within the wound bed. Assessment Active  Problems ICD-10 Lymphedema, not elsewhere classified Other specified peripheral vascular diseases Non-pressure chronic ulcer of other part of left lower leg with fat layer exposed Non-pressure chronic ulcer of other part of right lower leg with fat layer exposed Pressure ulcer of left buttock, unstageable Paroxysmal atrial fibrillation Essential (primary) hypertension Long term (current) use of anticoagulants Presence of cardiac pacemaker Presence of other heart-valve replacement Plan Follow-up Appointments: Return Appointment in 1 week. - w/ Jeri Cos, PA and [place] # 9 Other: - [facility] consult [facility]  they will call you to schedule Bathing/ Shower/ Hygiene: May shower and wash wound with soap and water. - with dressings. Edema Control - Lymphedema / SCD / Other: Elevate legs to the level of the heart or above for 30 minutes daily and/or when sitting, a frequency of: - 3-4 times a day throughout the day. Avoid standing for long periods of time. Off-Loading: Turn and reposition every 2 hours Other: - ensure to get up to stand and walk to limit pressure to buttock wound. WOUND #1: - Lower Leg Wound Laterality: Left, Medial, Distal Cleanser: Soap and Water 3 x Per Week/30 Days Discharge Instructions: May shower and wash wound with dial antibacterial soap and water prior to dressing change. Prim Dressing: KerraCel Ag Gelling Fiber Dressing, 2x2 in (silver alginate) (Generic) 3 x Per Week/30 Days ary Discharge Instructions: Apply silver alginate to wound bed as instructed Secondary Dressing: ABD Pad, 5x9 (Generic) 3 x Per Week/30 Days Discharge Instructions: Apply over primary dressing as directed. Secondary Dressing: Woven Gauze Sponge, Non-Sterile 4x4 in (Generic) 3 x Per Week/30 Days Discharge Instructions: Apply over primary dressing as directed. Secured With: Elastic Bandage 4 inch (ACE bandage)  (Generic) 3 x Per Week/30 Days Discharge Instructions: Secure with ACE bandage as directed. Secured With: [employer], 4.5x3.1 (in/yd) (Generic) 3 x Per Week/30 Days Discharge Instructions: Secure with Kerlix as directed. Secured With: 61M Medipore H Soft Cloth Surgical T ape, 4 x 10 (in/yd) (Generic) 3 x Per Week/30 Days Discharge Instructions: Secure with tape as directed. WOUND #3: - Lower Leg Wound Laterality: Right, Medial, Proximal Cleanser: Soap and Water 3 x Per Week/30 Days Discharge Instructions: May shower and wash wound with dial antibacterial soap and water prior to dressing change. Prim Dressing: betadine 3 x Per Week/30 Days ary Discharge Instructions: purchase over the counter and apply directly to wound. Secondary Dressing: ABD Pad, 5x9 (Generic) 3 x Per Week/30 Days Discharge Instructions: Apply over primary dressing as directed. Secured With: Elastic Bandage 4 inch (ACE bandage) (Generic) 3 x Per Week/30 Days Discharge Instructions: Secure with ACE bandage as directed. Secured With: [employer], 4.5x3.1 (in/yd) (Generic) 3 x Per Week/30 Days Discharge Instructions: Secure with Kerlix as directed. Secured With: 61M Medipore H Soft Cloth Surgical T ape, 4 x 10 (in/yd) (Generic) 3 x Per Week/30 Days Discharge Instructions: Secure with tape as directed. WOUND #5: - Lower Leg Wound Laterality: Right, Lateral Cleanser: Soap and Water 3 x Per Week/30 Days Discharge Instructions: May shower and wash wound with dial antibacterial soap and water prior to dressing change. Prim Dressing: KerraCel Ag Gelling Fiber Dressing, 2x2 in (silver alginate) (Generic) 3 x Per Week/30 Days ary Discharge Instructions: Apply silver alginate to wound bed as instructed Secondary Dressing: ABD Pad, 5x9 (Generic) 3 x Per Week/30 Days Discharge Instructions: Apply over primary dressing as directed. Secondary Dressing: Woven Gauze Sponge, Non-Sterile 4x4 in (Generic) 3 x Per Week/30  Days Discharge Instructions: Apply over primary  dressing as directed. Secured With: Elastic Bandage 4 inch (ACE bandage) (Generic) 3 x Per Week/30 Days Discharge Instructions: Secure with ACE bandage as directed. Secured With: [employer], 4.5x3.1 (in/yd) (Generic) 3 x Per Week/30 Days Discharge Instructions: Secure with Kerlix as directed. Secured With: 49M Medipore H Soft Cloth Surgical T ape, 4 x 10 (in/yd) (Generic) 3 x Per Week/30 Days Discharge Instructions: Secure with tape as directed. WOUND #6: - Gluteus Wound Laterality: Left Cleanser: Soap and Water Every Other Day/30 Days Discharge Instructions: May shower and wash wound with dial antibacterial soap and water prior to dressing change. Peri-Wound Care: Skin Prep (Generic) Every Other Day/30 Days Discharge Instructions: Use skin prep as directed Prim Dressing: KerraCel Ag Gelling Fiber Dressing, 2x2 in (silver alginate) (Generic) Every Other Day/30 Days ary Discharge Instructions: Apply silver alginate to wound bed as instructed Secondary Dressing: Zetuvit Plus Silicone Border Dressing 4x4 (in/in) (Generic) Every Other Day/30 Days Discharge Instructions: Apply silicone border over primary dressing as directed. 1. I am going to suggest that we going continue with the wound care measures as before and the patient is in agreement with plan. This includes the use of the silver alginate to the wounds followed by the gauze and roll gauze to secure in place. 2. We can continue with Ace wrap slightly around the lower extremities. 3. I am also going to suggest that the alginate be used as well as a border foam dressing for the sacral area this I think is going to be the best way to keep this protected. We will see patient back for reevaluation in 1 week here in the clinic. If anything worsens or changes patient will contact our office for additional recommendations. Electronic Signature(s) Signed: 09/27/2021 4:15:18 PM By: Tonya Keeler PA-C Entered By: Tonya Glass on 09/27/2021 16:15:18 -------------------------------------------------------------------------------- SuperBill Details Patient Name: Date of Service: Jetty Peeks 09/27/2021 Medical Record Number: [id number] Patient Account Number: [id number] Date of Birth/Sex: Treating RN: Aug 17, 1945 (76 y.o. 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 leg with fat layer exposed L89.320 Pressure ulcer of left buttock, unstageable I48.0 Paroxysmal atrial fibrillation I10 Essential (primary) hypertension Z79.01 Long term (current) use of anticoagulants 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 Facility Procedures CPT4 Code: [id number] Description: [id number] - WOUND CARE VISIT-LEV 5 EST PT Modifier: Quantity: 1 Physician Procedures : CPT4 Code Description Modifier [id number] - WC PHYS LEVEL 4 - EST PT ICD-10 Diagnosis Description I89.0 Lymphedema, not elsewhere classified 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

## 2021-09-28 ENCOUNTER — Ambulatory Visit: Payer: Medicare Other | Admitting: Cardiovascular Disease

## 2021-09-28 NOTE — Progress Notes (Signed)
ASLAN, MONTAGNA ([id number]) Visit Report for 09/27/2021 Arrival Information Details Patient Name: Date of Service: [name], [name] 09/27/2021 3:15 PM Medical Record Number: [id number] Patient Account Number: [id number] Date of Birth/Sex: Treating RN: 12-24-1945 (76 y.o. Tonya Glass Primary Care Hugh Garrow: [name] Other Clinician: Referring Anthonio Mizzell: Treating Stepheny Canal/Extender: Darlen Round in Treatment: 1 Visit Information History Since Last Visit All ordered tests and consults were completed: Yes Patient Arrived: Wheel Chair Added or deleted any medications: No Arrival Time: 15:14 Any new allergies or adverse reactions: No Accompanied By: Daughter Had a fall or experienced change in No Transfer Assistance: None activities of daily living that may affect Patient Identification Verified: Yes risk of falls: Secondary Verification Process Completed: Yes Signs or symptoms of abuse/neglect since last visito No Patient Requires Transmission-Based No Hospitalized since last visit: No Precautions: Implantable device outside of the clinic excluding No Patient Has Alerts: Yes cellular tissue based products placed in the center Patient Alerts: Patient on Blood Thinner since last visit: ABI's 05/23:L:0.69R:0.79 Has Dressing in Place as Prescribed: Yes TBI's 05/23: L:0.11R:0.79 Has Compression in Place as Prescribed: Yes Pain Present Now: No Electronic Signature(s) Signed: 09/28/2021 4:40:18 PM By: Rhae Hammock RN Entered By: Rhae Hammock on 09/27/2021 15:45:25 -------------------------------------------------------------------------------- Clinic Level of Care Assessment Details Patient Name: Date of Service: Tonya Glass, Tonya Glass 09/27/2021 3:15 PM Medical Record Number: [id number] Patient Account Number: [id number] Date of Birth/Sex: Treating RN: August 27, 1945 (76 y.o. Tonya Glass, Tonya Glass Primary Care Adalai Perl: [name] Other Clinician: Referring  Alif Petrak: Treating Tonya Glass/Extender: Darlen Round in Treatment: 1 Clinic Level of Care Assessment Items TOOL 4 Quantity Score X- 1 0 Use when only an EandM is performed on FOLLOW-UP visit ASSESSMENTS - Nursing Assessment / Reassessment X- 1 10 Reassessment of Co-morbidities (includes updates in patient status) X- 1 5 Reassessment of Adherence to Treatment Plan ASSESSMENTS - Wound and Skin A ssessment / Reassessment '[]'$  - 0 Simple Wound Assessment / Reassessment - one wound X- 6 5 Complex Wound Assessment / Reassessment - multiple wounds '[]'$  - 0 Dermatologic / Skin Assessment (not related to wound area) ASSESSMENTS - Focused Assessment X- 2 5 Circumferential Edema Measurements - multi extremities '[]'$  - 0 Nutritional Assessment / Counseling / Intervention '[]'$  - 0 Lower Extremity Assessment (monofilament, tuning fork, pulses) '[]'$  - 0 Peripheral Arterial Disease Assessment (using hand held doppler) ASSESSMENTS - Ostomy and/or Continence Assessment and Care '[]'$  - 0 Incontinence Assessment and Management '[]'$  - 0 Ostomy Care Assessment and Management (repouching, etc.) PROCESS - Coordination of Care '[]'$  - 0 Simple Patient / Family Education for ongoing care X- 1 20 Complex (extensive) Patient / Family Education for ongoing care X- 1 10 Staff obtains Programmer, systems, Records, T Results / Process Orders est '[]'$  - 0 Staff telephones HHA, Nursing Homes / Clarify orders / etc '[]'$  - 0 Routine Transfer to another Facility (non-emergent condition) '[]'$  - 0 Routine Hospital Admission (non-emergent condition) '[]'$  - 0 New Admissions / Biomedical engineer / Ordering NPWT Apligraf, etc. , '[]'$  - 0 Emergency Hospital Admission (emergent condition) '[]'$  - 0 Simple Discharge Coordination X- 1 15 Complex (extensive) Discharge Coordination PROCESS - Special Needs '[]'$  - 0 Pediatric / Minor Patient Management '[]'$  - 0 Isolation Patient Management '[]'$  - 0 Hearing / Language / Visual  special needs '[]'$  - 0 Assessment of Community assistance (transportation, D/C planning, etc.) '[]'$  - 0 Additional assistance / Altered mentation '[]'$  - 0 Support Surface(s) Assessment (bed, cushion, seat, etc.)  INTERVENTIONS - Wound Cleansing / Measurement '[]'$  - 0 Simple Wound Cleansing - one wound X- 6 5 Complex Wound Cleansing - multiple wounds X- 1 5 Wound Imaging (photographs - any number of wounds) '[]'$  - 0 Wound Tracing (instead of photographs) '[]'$  - 0 Simple Wound Measurement - one wound X- 6 5 Complex Wound Measurement - multiple wounds INTERVENTIONS - Wound Dressings '[]'$  - 0 Small Wound Dressing one or multiple wounds '[]'$  - 0 Medium Wound Dressing one or multiple wounds X- 6 20 Large Wound Dressing one or multiple wounds '[]'$  - 0 Application of Medications - topical '[]'$  - 0 Application of Medications - injection INTERVENTIONS - Miscellaneous '[]'$  - 0 External ear exam '[]'$  - 0 Specimen Collection (cultures, biopsies, blood, body fluids, etc.) '[]'$  - 0 Specimen(s) / Culture(s) sent or taken to Lab for analysis '[]'$  - 0 Patient Transfer (multiple staff / Civil Service fast streamer / Similar devices) '[]'$  - 0 Simple Staple / Suture removal (25 or less) '[]'$  - 0 Complex Staple / Suture removal (26 or more) '[]'$  - 0 Hypo / Hyperglycemic Management (close monitor of Blood Glucose) '[]'$  - 0 Ankle / Brachial Index (ABI) - do not check if billed separately X- 1 5 Vital Signs Has the patient been seen at the hospital within the last three years: Yes Total Score: 290 Level Of Care: New/Established - Level 5 Electronic Signature(s) Signed: 09/28/2021 4:40:18 PM By: Rhae Hammock RN Entered By: Rhae Hammock on 09/27/2021 16:11:46 -------------------------------------------------------------------------------- Encounter Discharge Information Details Patient Name: Date of Service: Tonya Glass, Zapata 09/27/2021 3:15 PM Medical Record Number: [id number] Patient Account Number: [id number] Date of  Birth/Sex: Treating RN: 30-Mar-1946 (76 y.o. Tonya Glass, Tonya Glass Primary Care Rayaan Lorah: [name] Other Clinician: Referring Tonya Glass: Treating Tresean Mattix/Extender: Darlen Round in Treatment: 1 Encounter Discharge Information Items Discharge Condition: Stable Ambulatory Status: Wheelchair Discharge Destination: Home Transportation: Private Auto Accompanied By: daughter Schedule Follow-up Appointment: Yes Clinical Summary of Care: Patient Declined Electronic Signature(s) Signed: 09/28/2021 4:40:18 PM By: Rhae Hammock RN Entered By: Rhae Hammock on 09/27/2021 16:12:50 -------------------------------------------------------------------------------- Lower Extremity Assessment Details Patient Name: Date of Service: LARIYAH, Tonya Glass 09/27/2021 3:15 PM Medical Record Number: [id number] Patient Account Number: [id number] Date of Birth/Sex: Treating RN: 1945/12/24 (76 y.o. Tonya Glass Primary Care Bryce Kimble: [name] Other Clinician: Referring Marilynn Ekstein: Treating Kainat Pizana/Extender: Darlen Round in Treatment: 1 Edema Assessment Assessed: [name] Edema: [Left: Yes] [Right: Yes] Calf Left: Right: Point of Measurement: 32 cm From Medial Instep 40.5 cm 43 cm Ankle Left: Right: Point of Measurement: 8 cm From Medial Instep 35 cm 24 cm Vascular Assessment Pulses: Dorsalis Pedis Palpable: [Left:Yes] [Right:Yes] Electronic Signature(s) Signed: 09/27/2021 4:30:40 PM By: Lorrin Jackson Entered By: Lorrin Jackson on 09/27/2021 15:31:19 -------------------------------------------------------------------------------- Multi-Disciplinary Care Plan Details Patient Name: Date of Service: Tonya Glass 09/27/2021 3:15 PM Medical Record Number: [id number] Patient Account Number: [id number] Date of Birth/Sex: Treating RN: 09-19-1945 (76 y.o. Tonya Glass, Tonya Glass Primary Care Roddy Bellamy: [name] Other  Clinician: Referring Channon Brougher: Treating Ivadell Gaul/Extender: Darlen Round in Treatment: 1 Active Inactive Pressure Nursing Diagnoses: Potential for impaired tissue integrity related to pressure, friction, moisture, and shear Goals: Patient/caregiver will verbalize understanding of pressure ulcer management Date Initiated: 09/20/2021 Target Resolution Date: 10/20/2021 Goal Status: Active Interventions: Assess: immobility, friction, shearing, incontinence upon admission and as needed Assess potential for pressure ulcer upon admission and as needed Provide education on pressure ulcers Treatment Activities: Pressure reduction/relief  device ordered : 09/20/2021 T ordered outside of clinic : 09/20/2021 est Notes: Venous Leg Ulcer Nursing Diagnoses: Potential for venous Insuffiency (use before diagnosis confirmed) Goals: Patient will maintain optimal edema control Date Initiated: 09/20/2021 Target Resolution Date: 10/20/2021 Goal Status: Active Interventions: Assess peripheral edema status every visit. Compression as ordered Provide education on venous insufficiency Treatment Activities: Non-invasive vascular studies : 09/20/2021 Notes: Wound/Skin Impairment Nursing Diagnoses: Knowledge deficit related to ulceration/compromised skin integrity Goals: Patient/caregiver will verbalize understanding of skin care regimen Date Initiated: 09/20/2021 Target Resolution Date: 10/20/2021 Goal Status: Active Interventions: Assess patient/caregiver ability to perform ulcer/skin care regimen upon admission and as needed Assess ulceration(s) every visit Provide education on ulcer and skin care Treatment Activities: Skin care regimen initiated : 09/20/2021 Topical wound management initiated : 09/20/2021 Notes: Electronic Signature(s) Signed: 09/28/2021 4:40:18 PM By: Rhae Hammock RN Entered By: Rhae Hammock on 09/27/2021  15:48:24 -------------------------------------------------------------------------------- Pain Assessment Details Patient Name: Date of Service: MARLINA, CATALDI 09/27/2021 3:15 PM Medical Record Number: [id number] Patient Account Number: [id number] Date of Birth/Sex: Treating RN: 1945-07-19 (76 y.o. Tonya Glass Primary Care Kvion Shapley: [name] Other Clinician: Referring Shar Paez: Treating Kalik Hoare/Extender: Darlen Round in Treatment: 1 Active Problems Location of Pain Severity and Description of Pain Patient Has Paino No Site Locations Pain Management and Medication Current Pain Management: Electronic Signature(s) Signed: 09/27/2021 4:30:40 PM By: Lorrin Jackson Entered By: Lorrin Jackson on 09/27/2021 15:15:23 -------------------------------------------------------------------------------- Patient/Caregiver Education Details Patient Name: Date of Service: Tonya Glass 5/17/2023andnbsp3:15 PM Medical Record Number: [id number] Patient Account Number: [id number] Date of Birth/Gender: Treating RN: [birth year] (76 y.o. Tonya Glass, Tonya Glass Primary Care Physician: [name] Other Clinician: Referring Physician: Treating Physician/Extender: Darlen Round in Treatment: 1 Education Assessment Education Provided To: Patient Education Topics Provided Wound/Skin Impairment: Methods: Explain/Verbal Responses: Reinforcements needed, State content correctly Electronic Signature(s) Signed: 09/28/2021 4:40:18 PM By: Rhae Hammock RN Entered By: Rhae Hammock on 09/27/2021 15:48:39 -------------------------------------------------------------------------------- Wound Assessment Details Patient Name: Date of Service: LUNETTE, TAPP 09/27/2021 3:15 PM Medical Record Number: [id number] Patient Account Number: [id number] Date of Birth/Sex: Treating RN: [birth year] (76 y.o. Tonya Glass Primary Care Ardys Hataway: [name] Other Clinician: Referring Danyal Adorno: Treating Sherolyn Trettin/Extender: Darlen Round in Treatment: 1 Wound Status Wound Number: 1 Primary Lymphedema Etiology: Wound Location: Left, Distal, Medial Lower Leg Secondary Diabetic Wound/Ulcer of the Lower Extremity Wounding Event: Blister Etiology: Date Acquired: 08/21/2021 Wound Open Weeks Of Treatment: 1 Status: Clustered Wound: No Comorbid Chronic Obstructive Pulmonary Disease (COPD), Congestive History: Heart Failure, Hypertension, Type II Diabetes, Rheumatoid Arthritis, Osteoarthritis Photos Photo Uploaded By: Donavan Burnet on 09/28/2021 14:12:18 Wound Measurements Length: (cm) 1 Width: (cm) 2 Depth: (cm) 0.1 Area: (cm) 1.571 Volume: (cm) 0.157 % Reduction in Area: 74% % Reduction in Volume: 74% Epithelialization: Medium (34-66%) Tunneling: No Undermining: No Wound Description Classification: Full Thickness Without Exposed Support Structures Wound Margin: Distinct, outline attached Exudate Amount: Medium Exudate Type: Serosanguineous Exudate Color: red, brown Foul Odor After Cleansing: No Slough/Fibrino Yes Wound Bed Granulation Amount: Large (67-100%) Exposed Structure Granulation Quality: Pink, Pale Fascia Exposed: No Necrotic Amount: None Present (0%) Fat Layer (Subcutaneous Tissue) Exposed: Yes Tendon Exposed: No Muscle Exposed: No Joint Exposed: No Bone Exposed: No Treatment Notes Wound #1 (Lower Leg) Wound Laterality: Left, Medial, Distal Cleanser Soap and Water Discharge Instruction: May shower and wash wound with dial antibacterial soap and water prior to dressing change. Peri-Wound Care Topical Primary Dressing KerraCel  Ag Gelling Fiber Dressing, 2x2 in (silver alginate) Discharge Instruction: Apply silver alginate to wound bed as instructed Secondary Dressing ABD Pad, 5x9 Discharge Instruction: Apply over primary dressing as directed. Woven Gauze Sponge, Non-Sterile 4x4  in Discharge Instruction: Apply over primary dressing as directed. Secured With Elastic Bandage 4 inch (ACE bandage) Discharge Instruction: Secure with ACE bandage as directed. Kerlix Roll Sterile, 4.5x3.1 (in/yd) Discharge Instruction: Secure with Kerlix as directed. 47M Medipore H Soft Cloth Surgical T ape, 4 x 10 (in/yd) Discharge Instruction: Secure with tape as directed. Compression Wrap Compression Stockings Add-Ons Electronic Signature(s) Signed: 09/27/2021 4:30:40 PM By: Lorrin Jackson Entered By: Lorrin Jackson on 09/27/2021 15:26:25 -------------------------------------------------------------------------------- Wound Assessment Details Patient Name: Date of Service: Tonya Glass, TEP 09/27/2021 3:15 PM Medical Record Number: [id number] Patient Account Number: [id number] Date of Birth/Sex: Treating RN: 08/31/1945 (76 y.o. Tonya Glass Primary Care Danaka Llera: [name] Other Clinician: Referring Pascha Fogal: Treating Lenette Rau/Extender: Darlen Round in Treatment: 1 Wound Status Wound Number: 2 Primary Lymphedema Etiology: Wound Location: Left, Proximal, Medial Lower Leg Secondary Diabetic Wound/Ulcer of the Lower Extremity Wounding Event: Blister Etiology: Date Acquired: 08/21/2021 Wound Healed - Epithelialized Weeks Of Treatment: 1 Status: Clustered Wound: Yes Comorbid Chronic Obstructive Pulmonary Disease (COPD), Congestive History: Heart Failure, Hypertension, Type II Diabetes, Rheumatoid Arthritis, Osteoarthritis Photos Photo Uploaded By: Donavan Burnet on 09/28/2021 14:12:18 Wound Measurements Length: (cm) Width: (cm) Depth: (cm) Area: (cm) Volume: (cm) 0 % Reduction in Area: 100% 0 % Reduction in Volume: 100% 0 0 0 Wound Description Classification: Full Thickness Without Exposed Support Structur es Electronic Signature(s) Signed: 09/27/2021 4:30:40 PM By: Lorrin Jackson Entered By: Lorrin Jackson on 09/27/2021  15:31:29 -------------------------------------------------------------------------------- Wound Assessment Details Patient Name: Date of Service: ARROW, EMMERICH 09/27/2021 3:15 PM Medical Record Number: [id number] Patient Account Number: [id number] Date of Birth/Sex: Treating RN: 09-06-1945 (76 y.o. Tonya Glass Primary Care Kyria Bumgardner: [name] Other Clinician: Referring Tamerra Merkley: Treating Rodney Wigger/Extender: Darlen Round in Treatment: 1 Wound Status Wound Number: 3 Primary Lymphedema Etiology: Wound Location: Right, Proximal, Medial Lower Leg Secondary Diabetic Wound/Ulcer of the Lower Extremity Wounding Event: Blister Etiology: Date Acquired: 08/21/2021 Wound Open Weeks Of Treatment: 1 Status: Clustered Wound: No Comorbid Chronic Obstructive Pulmonary Disease (COPD), Congestive History: Heart Failure, Hypertension, Type II Diabetes, Rheumatoid Arthritis, Osteoarthritis Photos Photo Uploaded By: Donavan Burnet on 09/28/2021 14:12:18 Wound Measurements Length: (cm) 0.3 Width: (cm) 0.3 Depth: (cm) 0.1 Area: (cm) 0.071 Volume: (cm) 0.007 % Reduction in Area: 94% % Reduction in Volume: 94.1% Epithelialization: None Tunneling: No Undermining: No Wound Description Classification: Full Thickness Without Exposed Support Struct Wound Margin: Distinct, outline attached Exudate Amount: Medium Exudate Type: Serosanguineous Exudate Color: red, brown ures Foul Odor After Cleansing: No Slough/Fibrino Yes Wound Bed Granulation Amount: None Present (0%) Exposed Structure Necrotic Amount: Large (67-100%) Fascia Exposed: No Necrotic Quality: Eschar Fat Layer (Subcutaneous Tissue) Exposed: Yes Tendon Exposed: No Muscle Exposed: No Joint Exposed: No Bone Exposed: No Treatment Notes Wound #3 (Lower Leg) Wound Laterality: Right, Medial, Proximal Cleanser Soap and Water Discharge Instruction: May shower and wash wound with dial antibacterial  soap and water prior to dressing change. Peri-Wound Care Topical Primary Dressing betadine Discharge Instruction: purchase over the counter and apply directly to wound. Secondary Dressing ABD Pad, 5x9 Discharge Instruction: Apply over primary dressing as directed. Secured With Elastic Bandage 4 inch (ACE bandage) Discharge Instruction: Secure with ACE bandage as directed. Kerlix Roll Sterile, 4.5x3.1 (in/yd) Discharge Instruction: Secure with  Kerlix as directed. 40M Medipore H Soft Cloth Surgical T ape, 4 x 10 (in/yd) Discharge Instruction: Secure with tape as directed. Compression Wrap Compression Stockings Add-Ons Electronic Signature(s) Signed: 09/27/2021 4:30:40 PM By: Lorrin Jackson Entered By: Lorrin Jackson on 09/27/2021 15:25:20 -------------------------------------------------------------------------------- Wound Assessment Details Patient Name: Date of Service: SHAINNA, FAUX 09/27/2021 3:15 PM Medical Record Number: [id number] Patient Account Number: [id number] Date of Birth/Sex: Treating RN: 05-24-1945 (76 y.o. Tonya Glass, [place] Primary Care Serenity Batley: [name] Other Clinician: Referring Melyssa Signor: Treating Roselani Grajeda/Extender: Darlen Round in Treatment: 1 Wound Status Wound Number: 4 Primary Lymphedema Etiology: Wound Location: Right, Distal, Medial Lower Leg Secondary Diabetic Wound/Ulcer of the Lower Extremity Wounding Event: Gradually Appeared Etiology: Date Acquired: 08/21/2021 Wound Healed - Epithelialized Weeks Of Treatment: 1 Status: Clustered Wound: No Comorbid Chronic Obstructive Pulmonary Disease (COPD), Congestive History: Heart Failure, Hypertension, Type II Diabetes, Rheumatoid Arthritis, Osteoarthritis Photos Photo Uploaded By: Donavan Burnet on 09/28/2021 14:12:18 Wound Measurements Length: (cm) Width: (cm) Depth: (cm) Area: (cm) Volume: (cm) 0 % Reduction in Area: 100% 0 % Reduction in Volume: 100% 0  Epithelialization: None 0 Tunneling: No 0 Undermining: No Wound Description Classification: Full Thickness Without Exposed Support Structu Wound Margin: Distinct, outline attached Exudate Amount: Medium Exudate Type: Serosanguineous Exudate Color: red, brown res Foul Odor After Cleansing: No Slough/Fibrino Yes Wound Bed Granulation Amount: Small (1-33%) Exposed Structure Granulation Quality: Red Fascia Exposed: No Necrotic Amount: Large (67-100%) Fat Layer (Subcutaneous Tissue) Exposed: Yes Necrotic Quality: Eschar Tendon Exposed: No Muscle Exposed: No Joint Exposed: No Bone Exposed: No Treatment Notes Wound #4 (Lower Leg) Wound Laterality: Right, Medial, Distal Cleanser Peri-Wound Care Topical Primary Dressing Secondary Dressing Secured With Compression Wrap Compression Stockings Add-Ons Electronic Signature(s) Signed: 09/28/2021 4:40:18 PM By: Rhae Hammock RN Entered By: Rhae Hammock on 09/27/2021 15:49:29 -------------------------------------------------------------------------------- Wound Assessment Details Patient Name: Date of Service: BECKEY, POLKOWSKI 09/27/2021 3:15 PM Medical Record Number: [id number] Patient Account Number: [id number] Date of Birth/Sex: Treating RN: June 06, 1945 (76 y.o. Tonya Glass Primary Care Lillian Ballester: [name] Other Clinician: Referring Sadeen Wiegel: Treating Naszir Cott/Extender: Darlen Round in Treatment: 1 Wound Status Wound Number: 5 Primary Lymphedema Etiology: Wound Location: Right, Lateral Lower Leg Secondary Diabetic Wound/Ulcer of the Lower Extremity Wounding Event: Blister Etiology: Date Acquired: 09/15/2021 Wound Open Weeks Of Treatment: 1 Status: Clustered Wound: No Comorbid Chronic Obstructive Pulmonary Disease (COPD), Congestive History: Heart Failure, Hypertension, Type II Diabetes, Rheumatoid Arthritis, Osteoarthritis Photos Photo Uploaded By: Donavan Burnet on 09/28/2021  14:12:55 Wound Measurements Length: (cm) 3.8 Width: (cm) 4 Depth: (cm) 0.1 Area: (cm) 11.938 Volume: (cm) 1.194 % Reduction in Area: -279.9% % Reduction in Volume: -280.3% Epithelialization: Small (1-33%) Tunneling: No Undermining: No Wound Description Classification: Full Thickness Without Exposed Support Structures Wound Margin: Distinct, outline attached Exudate Amount: Medium Exudate Type: Sanguinous Exudate Color: red Foul Odor After Cleansing: No Slough/Fibrino No Wound Bed Granulation Amount: Large (67-100%) Exposed Structure Granulation Quality: Pink, Pale, Friable Fascia Exposed: No Necrotic Amount: None Present (0%) Fat Layer (Subcutaneous Tissue) Exposed: Yes Tendon Exposed: No Muscle Exposed: No Joint Exposed: No Bone Exposed: No Treatment Notes Wound #5 (Lower Leg) Wound Laterality: Right, Lateral Cleanser Soap and Water Discharge Instruction: May shower and wash wound with dial antibacterial soap and water prior to dressing change. Peri-Wound Care Topical Primary Dressing KerraCel Ag Gelling Fiber Dressing, 2x2 in (silver alginate) Discharge Instruction: Apply silver alginate to wound bed as instructed Secondary Dressing ABD Pad, 5x9 Discharge Instruction: Apply  over primary dressing as directed. Woven Gauze Sponge, Non-Sterile 4x4 in Discharge Instruction: Apply over primary dressing as directed. Secured With Elastic Bandage 4 inch (ACE bandage) Discharge Instruction: Secure with ACE bandage as directed. Kerlix Roll Sterile, 4.5x3.1 (in/yd) Discharge Instruction: Secure with Kerlix as directed. 53M Medipore H Soft Cloth Surgical T ape, 4 x 10 (in/yd) Discharge Instruction: Secure with tape as directed. Compression Wrap Compression Stockings Add-Ons Electronic Signature(s) Signed: 09/27/2021 4:30:40 PM By: Lorrin Jackson Entered By: Lorrin Jackson on 09/27/2021  15:25:54 -------------------------------------------------------------------------------- Wound Assessment Details Patient Name: Date of Service: Tonya Glass, Tonya Glass 09/27/2021 3:15 PM Medical Record Number: [id number] Patient Account Number: [id number] Date of Birth/Sex: Treating RN: [birth year] (76 y.o. Tonya Glass Primary Care Raun Routh: [name] Other Clinician: Referring Eboney Claybrook: Treating Lakendra Helling/Extender: Darlen Round in Treatment: 1 Wound Status Wound Number: 6 Primary Pressure Ulcer Etiology: Wound Location: Left Gluteus Wound Open Wounding Event: Pressure Injury Status: Date Acquired: 08/29/2021 Comorbid Chronic Obstructive Pulmonary Disease (COPD), Congestive Weeks Of Treatment: 1 History: Heart Failure, Hypertension, Type II Diabetes, Rheumatoid Clustered Wound: No Arthritis, Osteoarthritis Photos Wound Measurements Length: (cm) 3 Width: (cm) 2 Depth: (cm) 0.2 Area: (cm) 4.712 Volume: (cm) 0.942 % Reduction in Area: -524.9% % Reduction in Volume: -523.8% Epithelialization: None Tunneling: No Undermining: No Wound Description Classification: Unstageable/Unclassified Wound Margin: Distinct, outline attached Exudate Amount: Medium Exudate Type: Serosanguineous Exudate Color: red, brown Foul Odor After Cleansing: No Slough/Fibrino Yes Wound Bed Granulation Amount: Small (1-33%) Exposed Structure Granulation Quality: Red Fascia Exposed: No Necrotic Amount: Large (67-100%) Fat Layer (Subcutaneous Tissue) Exposed: Yes Necrotic Quality: Adherent Slough Tendon Exposed: No Muscle Exposed: No Joint Exposed: No Bone Exposed: No Treatment Notes Wound #6 (Gluteus) Wound Laterality: Left Cleanser Soap and Water Discharge Instruction: May shower and wash wound with dial antibacterial soap and water prior to dressing change. Peri-Wound Care Skin Prep Discharge Instruction: Use skin prep as directed Topical Primary  Dressing KerraCel Ag Gelling Fiber Dressing, 2x2 in (silver alginate) Discharge Instruction: Apply silver alginate to wound bed as instructed Secondary Dressing Zetuvit Plus Silicone Border Dressing 4x4 (in/in) Discharge Instruction: Apply silicone border over primary dressing as directed. Secured With Compression Wrap Compression Stockings Environmental education officer) Signed: 09/27/2021 4:30:40 PM By: Lorrin Jackson Entered By: Lorrin Jackson on 09/27/2021 15:13:36 -------------------------------------------------------------------------------- Wound Assessment Details Patient Name: Date of Service: Tonya Glass, Tonya Glass 09/27/2021 3:15 PM Medical Record Number: [id number] Patient Account Number: [id number] Date of Birth/Sex: Treating RN: April 17, 1946 (76 y.o. Tonya Glass Primary Care Trevia Nop: [name] Other Clinician: Referring Alaysha Jefcoat: Treating Nancee Brownrigg/Extender: Darlen Round in Treatment: 1 Wound Status Wound Number: 7 Primary Lymphedema Etiology: Wound Location: Right, Posterior Lower Leg Wound Open Wounding Event: Blister Status: Date Acquired: 09/27/2021 Comorbid Chronic Obstructive Pulmonary Disease (COPD), Congestive Weeks Of Treatment: 0 History: Heart Failure, Hypertension, Type II Diabetes, Rheumatoid Clustered Wound: No Arthritis, Osteoarthritis Photos Photo Uploaded By: Donavan Burnet on 09/28/2021 14:12:55 Wound Measurements Length: (cm) 1.5 Width: (cm) 1.5 Depth: (cm) 0.1 Area: (cm) 1.767 Volume: (cm) 0.177 % Reduction in Area: % Reduction in Volume: Epithelialization: None Tunneling: No Undermining: No Wound Description Classification: Full Thickness Without Exposed Support Structu Wound Margin: Distinct, outline attached Exudate Amount: Medium Exudate Type: Sanguinous Exudate Color: red res Foul Odor After Cleansing: No Slough/Fibrino No Wound Bed Granulation Amount: Large (67-100%) Exposed  Structure Granulation Quality: Red, Pink Fascia Exposed: No Necrotic Amount: None Present (0%) Fat Layer (Subcutaneous Tissue) Exposed: Yes Tendon Exposed: No Muscle Exposed:  No Joint Exposed: No Bone Exposed: No Treatment Notes Wound #7 (Lower Leg) Wound Laterality: Right, Posterior Cleanser Peri-Wound Care Topical Primary Dressing Secondary Dressing Secured With Compression Wrap Compression Stockings Add-Ons Electronic Signature(s) Signed: 09/27/2021 4:30:40 PM By: Lorrin Jackson Entered By: Lorrin Jackson on 09/27/2021 15:27:49 -------------------------------------------------------------------------------- [place] Details Patient Name: Date of Service: Tonya Glass, Tonya Glass 09/27/2021 3:15 PM Medical Record Number: [id number] Patient Account Number: [id number] Date of Birth/Sex: Treating RN: 05-18-1945 (76 y.o. Tonya Glass Primary Care Cardale Dorer: [name] Other Clinician: Referring Render Marley: Treating Courtlyn Aki/Extender: Darlen Round in Treatment: 1 Vital Signs Time Taken: 15:10 Temperature (F): 98.3 Height (in): 60 Pulse (bpm): 60 Weight (lbs): 202 Respiratory Rate (breaths/min): 18 Body Mass Index (BMI): 39.4 Blood Pressure (mmHg): 118/70 Reference Range: 80 - 120 mg / dl Electronic Signature(s) Signed: 09/27/2021 4:30:40 PM By: Lorrin Jackson Entered By: Lorrin Jackson on 09/27/2021 15:10:50

## 2021-10-04 ENCOUNTER — Encounter (HOSPITAL_BASED_OUTPATIENT_CLINIC_OR_DEPARTMENT_OTHER): Payer: Medicare Other | Admitting: Physician Assistant

## 2021-10-10 ENCOUNTER — Encounter: Payer: Medicare Other | Admitting: Vascular Surgery

## 2021-10-10 ENCOUNTER — Telehealth: Payer: Self-pay | Admitting: Cardiovascular Disease

## 2021-10-10 ENCOUNTER — Encounter: Payer: Self-pay | Admitting: Internal Medicine

## 2021-10-10 ENCOUNTER — Ambulatory Visit (INDEPENDENT_AMBULATORY_CARE_PROVIDER_SITE_OTHER): Payer: Medicare Other | Admitting: Internal Medicine

## 2021-10-10 VITALS — BP 112/70 | HR 57 | Ht 60.0 in | Wt 200.0 lb

## 2021-10-10 DIAGNOSIS — J441 Chronic obstructive pulmonary disease with (acute) exacerbation: Secondary | ICD-10-CM

## 2021-10-10 DIAGNOSIS — Z903 Acquired absence of stomach [part of]: Secondary | ICD-10-CM | POA: Diagnosis not present

## 2021-10-10 DIAGNOSIS — J398 Other specified diseases of upper respiratory tract: Secondary | ICD-10-CM | POA: Diagnosis not present

## 2021-10-10 DIAGNOSIS — G4733 Obstructive sleep apnea (adult) (pediatric): Secondary | ICD-10-CM

## 2021-10-10 DIAGNOSIS — R053 Chronic cough: Secondary | ICD-10-CM

## 2021-10-10 MED ORDER — PREDNISONE 20 MG PO TABS: 40.0000 mg | ORAL_TABLET | Freq: Every day | ORAL | 0 refills | Status: DC

## 2021-10-10 NOTE — Progress Notes (Signed)
Tonya Glass    [id number]    April 09, 1946  Primary Care Physician:Smith, Malva Limes., FNP  Referring Physician: Sanda Klein, MD [address] [place],  [place] [address] Reason for Consultation: shortness of breath and cough Date of Consultation: 10/10/2021  Chief complaint:   Chief Complaint  Patient presents with   Consult    She has productive cough that is green to yellow to brown all the time during the day and night.      HPI: Tonya Glass is a 76 y.o. woman with former tobacco use disorder and AVR who presents for new patient evaluation for shortness of breath and cough.  She has seen her cardiologist Dr. Sallyanne Kuster and had a LHC and RHC which showed nonobstructive CAD and 3+ Aortic insufficiency. She takes lasix daily and has some CKD so her lasix was recently decreased.   She has been coughing with shortness of breath for the last 9 months. Symptoms have been unchanged since.   Cough is all day and also wakes her up at night. She brings up mucus  She has to sleep sitting up. If she lays flat her mouth gets dry and feels like she is choking. She does snore. She feels like it's hard to get a good night's sleep. She naps all day. She has a split night study pending ordered by cardiology.   She wheezes. She has never had pfts.   She does take symbicort and albuterol inhaler/nebulizer(takes each 2 puffs twice a day). She does feel like they help but not 100%. symptoms  She denies childhood respiratory disease.   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  Exposures: lives at home alone. But going into assisted living.  Smoking history: quit smoking in 2001 2.5 x 40 years = 100 pack years.   Social History   Occupational History   Occupation: retired  Tobacco Use    Smoking status: Former    Packs/day: 2.00    Years: 40.00    Pack years: 80.00    Types: Cigarettes    Quit date: 05/29/1999    Years since quitting: 22.3   Smokeless tobacco: Never  Vaping Use   Vaping Use: Never used  Substance and Sexual Activity   Alcohol use: No   Drug use: No   Sexual activity: Yes    Relevant family history:  Family History  Problem Relation Age of Onset   Lung cancer Mother    Heart Problems Father    Hypertension Brother    Colon polyps Brother    Skin cancer Brother    Asthma Brother    Colon polyps Daughter    Diabetes Maternal Aunt    Breast cancer Maternal Aunt        mets to liver   Diabetes Paternal Aunt     Past Medical History:  Diagnosis Date   Anxiety    Aortic valve stenosis 10/10/2016   Arthritis    "fingers" (07/24/2017)   CHF (congestive heart failure) (HCC)    COPD (chronic obstructive pulmonary disease) (HCC)    Depression    Gallstones    Heart murmur    High cholesterol    History of kidney stones    Hypertension    Hypothyroidism  Migraines    "none in years til 01/2013-02/2013; none since"  (07/24/2017)   Orthopnoea    "just since open heart surgery" (02/25/2013)   Pacemaker    [employer]    Pneumonia ~ 2012   "walking pneumonia"   Pneumonia 07/23/2017   Stroke ([place]) 2001   denies residual on 02/25/2013   Symptomatic hypotension 10/10/2016   Syncope and collapse    "prior to open heart surgery" (02/25/2013)   Type II diabetes mellitus [facility])     Past Surgical History:  Procedure Laterality Date   AORTIC ARCH ANGIOGRAPHY N/A 08/07/2021   Procedure: AORTIC ARCH ANGIOGRAPHY;  Surgeon: Sherren Mocha, MD;  Location: [place] [facility];  Service: Cardiovascular;  Laterality: N/A;   BREAST SURGERY Right    "clamped blood vessel and stitched it up; in [place]"   [place]  01/26/2013   "cow valve put in; widened the second valve" (02/25/2013) - [place], [place]    CATARACT EXTRACTION W/ INTRAOCULAR LENS  IMPLANT, BILATERAL Bilateral 04/2017   CHOLECYSTECTOMY OPEN  1971   CYST EXCISION     "taken off my chest"   INSERT / REPLACE / REMOVE PACEMAKER  01/19/2013   [place] chamber   LAPAROSCOPIC GASTRIC SLEEVE RESECTION  2009   in [place]   RIGHT/LEFT [place] N/A 08/07/2021   Procedure: RIGHT/LEFT HEART CATH AND CORONARY ANGIOGRAPHY;  Surgeon: Sherren Mocha, MD;  Location: [place] [facility];  Service: Cardiovascular;  Laterality: N/A;   TEE WITHOUT CARDIOVERSION N/A 06/13/2021   Procedure: TRANSESOPHAGEAL ECHOCARDIOGRAM (TEE);  Surgeon: Sanda Klein, MD;  Location: [facility];  Service: Cardiovascular;  Laterality: N/A;   VEIN SURGERY Left    LLE; cut leg open and took out piece of vein that was protruding thru skin; stitched it up; done in [place]     Physical Exam: Blood pressure 112/70, pulse (!) 57, height 5' (1.524 m), weight 200 lb (90.7 kg), SpO2 97 %. Gen:      No acute distress, frequent wet cough. ENT:  +cobblestoning in oropharynx, dentures, no nasal polyps, mucus membranes moist Lungs:    No increased respiratory effort, symmetric chest wall excursion, clear to auscultation bilaterally, no wheezes or crackles CV:         RRR, systolic and diastolic murmurs are present, Abd:      + bowel sounds; obese, soft, non-tender MSK: no acute synovitis of DIP or PIP joints, no mechanics hands.  Skin:      Warm and dry; no rashes Neuro: normal speech, no focal facial asymmetry Psych: alert and oriented x3, normal mood and affect   Data Reviewed/Medical Decision Making:  Independent interpretation of tests: Imaging:  Review of patient's CT Chest images Feb 2023 revealed tracheomalacia, patulous esophagus. The patient's images have been independently reviewed by me.         View : No data to display.          Labs:  Lab Results  Component Value Date   WBC 8.0 07/19/2021   HGB 11.9 (L)  08/07/2021   HCT 35.0 (L) 08/07/2021   MCV 94 07/19/2021   PLT 192 07/19/2021   Lab Results  Component Value Date   NA 143 08/07/2021   K 4.2 08/07/2021   CL 100 07/19/2021   CO2 24 07/19/2021     Immunization status:  Immunization History  Administered Date(s) Administered   Influenza Split 01/26/2017  Influenza, High Dose Seasonal PF 01/22/2014, 01/22/2014, 02/11/2015, 02/11/2015, 02/02/2016, 12/27/2016, 01/25/2018, 01/22/2019   Influenza,inj,Quad PF,6+ Mos 01/22/2014, 02/11/2015   Influenza,inj,quad, With Preservative 02/02/2016   Influenza-Unspecified 01/26/2017   Pneumococcal Conjugate-13 12/08/2013, 10/04/2015   Pneumococcal Polysaccharide-23 06/24/2013, 06/22/2018   Pneumococcal-Unspecified 12/08/2013   Tetanus 06/20/2017   Zoster Recombinat (Shingrix) 02/13/2017, 02/13/2017   Zoster, Live 12/14/2013     I reviewed prior external note(s) from pcp, cardiology, hospital stay  I reviewed the result(s) of the labs and imaging as noted above.   I have ordered PFT, split night study   Assessment:  COPD with acute exacerbation Tracheomalacia/EDAC Chronic cough - maybe copd but also component of GERD and post nasal drainage History of gastric sleeve surgery CHF with h/o AVR   Plan/Recommendations:  Multifactorial symptoms of dyspnea and coughing related to COPD and co-morbid conditions. Challenging case.  Hopefully if she has OSA we can confirm with split night study and this will treat the EDAC better. Will trial prednisone 40 mg x 5 days to see if this has any improvement in cough for exacerbation. If not would expect cough is from post nasal drainage and GERD  May need to limit ICS-LABA in the future as ICS can worsen the TM, pending pfts may switch to lama-laba.  Await sleep study.   We discussed disease management and progression at length today regarding COPD.    Return to Care: Return in about 6 weeks (around 11/21/2021).  Lenice Llamas, MD Pulmonary  and [place]  CC: Croitoru, Dani Gobble, MD

## 2021-10-10 NOTE — Patient Instructions (Addendum)
Please schedule follow up scheduled with myself in 6 weeks.  If my schedule is not open yet, we will contact you with a reminder closer to that time. Please call [phone] if you haven't heard from [place] a month before.   Before your next visit I would like you to have: Full set of PFTs - same day as appointment  Try prednisone 40 mg x 5 days to see if this helps your cough and breathing.  Continue symbicort and albuterol as needed.   There is a long wait (7-8 weeks) for sleep studies, but hopefully they will contact you soon.   Understanding COPD   What is COPD? COPD stands for chronic obstructive pulmonary (lung) disease. COPD is a general term used for several lung diseases.  COPD is an umbrella term and encompasses other  common diseases in this group like chronic bronchitis and emphysema. Chronic asthma may also be included in this group. While some patients with COPD have only chronic bronchitis or emphysema, most patients have a combination of both.  You might hear these terms used in exchange for one another.   COPD adds to the work of the heart. Diseased lungs may reduce the amount of oxygen that goes to the blood. High blood pressure in blood vessels from the heart to the lungs makes it difficult for the heart to pump. Lung disease can also cause the body to produce too many red blood cells which may make the blood thicker and harder to pump.   Patients who have COPD with low oxygen levels may develop an enlarged heart (cor pulmonale). This condition weakens the heart and causes increased shortness of breath and swelling in the legs and feet.   Chronic bronchitis Chronic bronchitis is irritation and inflammation (swelling) of the lining in the bronchial tubes (air passages). The irritation causes coughing and an excess amount of mucus in the airways. The swelling makes it difficult to get air in and out of the lungs. The small, hair-like structures on the inside of the airways  (called cilia) may be damaged by the irritation. The cilia are then unable to help clean mucus from the airways.  Bronchitis is generally considered to be chronic when you have: a productive cough (cough up mucus) and shortness of breath that lasts about 3 months or more each year for 2 or more years in a row. Your doctor may define chronic bronchitis differently.   Emphysema Emphysema is the destruction, or breakdown, of the walls of the alveoli (air sacs) located at the end of the bronchial tubes. The damaged alveoli are not able to exchange oxygen and carbon dioxide between the lungs and the blood. The bronchioles lose their elasticity and collapse when you exhale, trapping air in the lungs. The trapped air keeps fresh air and oxygen from entering the lungs.   Who is affected by COPD? Emphysema and chronic bronchitis affect approximately 16 million people in the [place], or close to 11 percent of the population.   Symptoms of COPD  Shortness of breath  Shortness of breath with mild exercise (walking, using the stairs, etc.)  Chronic, productive cough (with mucus)  A feeling of "tightness" in the chest  Wheezing   What causes COPD? The two primary causes of COPD are cigarette smoking and alpha1-antitrypsin (AAT) deficiency. Air pollution and occupational dusts may also contribute to COPD, especially when the person exposed to these substances is a cigarette smoker.  Cigarette smoke causes COPD by irritating the  airways and creating inflammation that narrows the airways, making it more difficult to breathe. Cigarette smoke also causes the cilia to stop working properly so mucus and trapped particles are not cleaned from the airways. As a result, chronic cough and excess mucus production develop, leading to chronic bronchitis.  In some people, chronic bronchitis and infections can lead to destruction of the small airways, or emphysema.  AAT deficiency, an inherited disorder, can also lead  to emphysema. Alpha antitrypsin (AAT) is a protective material produced in the liver and transported to the lungs to help combat inflammation. When there is not enough of the chemical AAT, the body is no longer protected from an enzyme in the white blood cells.   How is COPD diagnosed?  To diagnose COPD, the physician needs to know: Do you smoke?  Have you had chronic exposure to dust or air pollutants?  Do other members of your family have lung disease?  Are you short of breath?  Do you get short of breath with exercise?  Do you have chronic cough and/or wheezing?  Do you cough up excess mucus?  To help with the diagnosis, the physician will conduct a thorough physical exam which includes:  Listening to your lungs and heart  Checking your blood pressure and pulse  Examining your nose and throat  Checking your feet and ankles for swelling   Laboratory and other tests Several laboratory and other tests are needed to confirm a diagnosis of COPD. These tests may include:  Chest X-ray to look for lung changes that could be caused by COPD   Spirometry and pulmonary function tests (PFTs) to determine lung volume and air flow  Pulse oximetry to measure the saturation of oxygen in the blood  Arterial blood gases (ABGs) to determine the amount of oxygen and carbon dioxide in the blood  Exercise testing to determine if the oxygen level in the blood drops during exercise   Treatment In the beginning stages of COPD, there is minimal shortness of breath that may be noticed only during exercise. As the disease progresses, shortness of breath may worsen and you may need to wear an oxygen device.   To help control other symptoms of COPD, the following treatments and lifestyle changes may be prescribed.  Quitting smoking  Avoiding cigarette smoke and other irritants  Taking medications including: a. bronchodilators b. anti-inflammatory agents c. oxygen d. antibiotics  Maintaining a healthy diet   Following a structured exercise program such as pulmonary rehabilitation Preventing respiratory infections  Controlling stress   If your COPD progresses, you may be eligible to be evaluated for lung volume reduction surgery or lung transplantation. You may also be eligible to participate in certain clinical trials (research studies). Ask your health care providers about studies being conducted in your hospital.   What is the outlook? Although COPD can not be cured, its symptoms can be treated and your quality of life can be improved. Your prognosis or outlook for the future will depend on how well your lungs are functioning, your symptoms, and how well you respond to and follow your treatment plan.

## 2021-10-10 NOTE — Telephone Encounter (Signed)
Patient stated that her PCP changed her lasix dosing to 40 mg in the AM and 40 mg in the PM. Awaiting current labs to be faxed from PCP [name], Brooke Bonito., FNP to verify dose change with Dr. Sallyanne Kuster, per patient request.

## 2021-10-10 NOTE — Telephone Encounter (Signed)
Pt c/o medication issue:  1. Name of Medication:   furosemide (LASIX) 40 MG tablet    furosemide (LASIX) 80 MG tablet   2. How are you currently taking this medication (dosage and times per day)? Take 1 tablet (40 mg total) by mouth daily Take 1 tablet (80 mg total) by mouth 2 (two) times daily.     3. Are you having a reaction (difficulty breathing--STAT)? No  4. What is your medication issue? Pt states that PCP made some changes to medication and would like to make sure that Dr. Loletha Grayer is okay with the change. Please advise

## 2021-10-11 ENCOUNTER — Ambulatory Visit (INDEPENDENT_AMBULATORY_CARE_PROVIDER_SITE_OTHER): Payer: Medicare Other

## 2021-10-11 DIAGNOSIS — I495 Sick sinus syndrome: Secondary | ICD-10-CM

## 2021-10-11 NOTE — Telephone Encounter (Signed)
Kim from [facility] states they did not change the prescription. She states it should still be 80 mg twice a day.

## 2021-10-12 LAB — CUP PACEART REMOTE DEVICE CHECK
Battery Remaining Longevity: 11 mo
Battery Remaining Percentage: 17 %
Brady Statistic RA Percent Paced: 83 %
Brady Statistic RV Percent Paced: 85 %
Date Time Interrogation Session: 20230531044200
Implantable Lead Implant Date: 20140908
Implantable Lead Implant Date: 20140908
Implantable Lead Location: 753859
Implantable Lead Location: 753860
Implantable Lead Model: 4135
Implantable Lead Model: 4136
Implantable Lead Serial Number: 29485718
Implantable Lead Serial Number: 29487623
Implantable Pulse Generator Implant Date: 20140908
Lead Channel Impedance Value: 546 Ohm
Lead Channel Impedance Value: 711 Ohm
Lead Channel Pacing Threshold Amplitude: 0.7 V
Lead Channel Pacing Threshold Pulse Width: 0.4 ms
Lead Channel Setting Pacing Amplitude: 2 V
Lead Channel Setting Pacing Amplitude: 2.5 V
Lead Channel Setting Pacing Pulse Width: 0.4 ms
Lead Channel Setting Sensing Sensitivity: 2.5 mV
Pulse Gen Serial Number: 160873

## 2021-10-12 NOTE — Telephone Encounter (Signed)
Patient has been made aware that PCP did not change her diuretic dosage.  She stated that she will be moving into [facility] assisted living today. She stated that she was excited about it and hoped that she would be able to stay active while there.  Her daughter will set up the transmitter in her room for her.   She has a follow up on 6/5 with Dr. Sallyanne Kuster.

## 2021-10-16 ENCOUNTER — Ambulatory Visit: Payer: Medicare Other | Admitting: Cardiovascular Disease

## 2021-10-17 ENCOUNTER — Telehealth: Payer: Self-pay | Admitting: Internal Medicine

## 2021-10-17 NOTE — Telephone Encounter (Signed)
Pt states that she has a sleep member mattress that is held together by magnets and she wants to know if this affects her pacemaker.

## 2021-10-17 NOTE — Telephone Encounter (Signed)
Returned phone call.  [place] number provided to family to call to inquire about if their type bed would interfere.

## 2021-10-24 NOTE — Progress Notes (Signed)
Tonya Glass, Tonya Glass ([id number]) Visit Report for 09/20/2021 Allergy List Details Patient Name: Date of Service: Tonya Glass, Tonya Glass 09/20/2021 8:30 A M Medical Record Number: [id number] Patient Account Number: [id number] Date of Birth/Sex: Treating RN: 1945/06/15 (76 y.o. Tonya Glass, Tonya Glass [name]: Tonya Glass Other Clinician: Referring Tonya Glass: Treating Tonya Glass/Extender: Tonya Glass in Treatment: 0 Allergies Active Allergies No Known Allergies Allergy Notes Electronic Signature(s) Signed: 09/20/2021 5:07:52 PM By: Rhae Hammock RN Entered By: Rhae Hammock on 09/19/2021 17:09:25 -------------------------------------------------------------------------------- Arrival Information Details Patient Name: Date of Service: Tonya Glass, Tonya Glass 09/20/2021 8:30 A M Medical Record Number: [id number] Patient Account Number: [id number] Date of Birth/Sex: Treating RN: 1945/09/23 (76 y.o. Tonya Glass, Tonya Glass Primary Care Tonya Glass: Tonya Glass Other Clinician: Referring Tonya Glass: Treating Tonya Glass/Extender: Tonya Glass in Treatment: 0 Visit Information Patient Arrived: Wheel Chair Arrival Time: 08:34 Accompanied By: self Transfer Assistance: Manual Patient Identification Verified: Yes Secondary Verification Process Completed: Yes Patient Requires Transmission-Based Precautions: No Patient Has Alerts: Yes Patient Alerts: Patient on Blood Thinner Electronic Signature(s) Signed: 10/24/2021 8:46:54 AM By: Tonya Glass Entered By: Tonya Glass on 09/20/2021 08:39:35 -------------------------------------------------------------------------------- Clinic Level of Care Assessment Details Patient Name: Date of Service: Tonya Glass, Tonya Glass 09/20/2021 8:30 A M Medical Record Number: [id number] Patient Account Number: [id number] Date of Birth/Sex: Treating RN: February 07, 1946 (76 y.o. Tonya Glass, Tonya Glass Primary Care Tonya Glass: Tonya Glass Other  Clinician: Referring Tonya Glass: Treating Tonya Glass/Extender: Tonya Glass in Treatment: 0 Clinic Level of Care Assessment Items TOOL 1 Quantity Score X- 1 0 Use when EandM and Procedure is performed on INITIAL visit ASSESSMENTS - Nursing Assessment / Reassessment X- 1 20 General Physical Exam (combine w/ comprehensive assessment (listed just below) when performed on new pt. evals) X- 1 25 Comprehensive Assessment (HX, ROS, Risk Assessments, Wounds Hx, etc.) ASSESSMENTS - Wound and Skin Assessment / Reassessment X- 1 10 Dermatologic / Skin Assessment (not related to wound area) ASSESSMENTS - Ostomy and/or Continence Assessment and Care '[]'$  - 0 Incontinence Assessment and Management '[]'$  - 0 Ostomy Care Assessment and Management (repouching, etc.) PROCESS - Coordination of Care '[]'$  - 0 Simple Patient / Family Education for ongoing care X- 1 20 Complex (extensive) Patient / Family Education for ongoing care X- 1 10 Staff obtains Programmer, systems, Records, T Results / Process Orders est '[]'$  - 0 Staff telephones HHA, Nursing Homes / Clarify orders / etc '[]'$  - 0 Routine Transfer to another Facility (non-emergent condition) '[]'$  - 0 Routine Hospital Admission (non-emergent condition) X- 1 15 New Admissions / Biomedical engineer / Ordering NPWT Apligraf, etc. , '[]'$  - 0 Emergency Hospital Admission (emergent condition) PROCESS - Special Needs '[]'$  - 0 Pediatric / Minor Patient Management '[]'$  - 0 Isolation Patient Management '[]'$  - 0 Hearing / Language / Visual special needs '[]'$  - 0 Assessment of Community assistance (transportation, D/C planning, etc.) '[]'$  - 0 Additional assistance / Altered mentation '[]'$  - 0 Support Surface(s) Assessment (bed, cushion, seat, etc.) INTERVENTIONS - Miscellaneous '[]'$  - 0 External ear exam '[]'$  - 0 Patient Transfer (multiple staff / Civil Service fast streamer / Similar devices) '[]'$  - 0 Simple Staple / Suture removal (25 or less) '[]'$  - 0 Complex Staple /  Suture removal (26 or more) '[]'$  - 0 Hypo/Hyperglycemic Management (do not check if billed separately) X- 1 15 Ankle / Brachial Index (ABI) - do not check if billed separately Has the patient been seen at the hospital within the last three years: Yes Total  Score: 115 Level Of Care: New/Established - Level 3 Electronic Signature(s) Signed: 09/20/2021 6:15:37 PM By: Tonya Pilling RN, BSN Entered By: Tonya Glass on 09/20/2021 12:20:25 -------------------------------------------------------------------------------- Encounter Discharge Information Details Patient Name: Date of Service: Tonya Glass, Tonya Glass 09/20/2021 8:30 A M Medical Record Number: [id number] Patient Account Number: [id number] Date of Birth/Sex: Treating RN: 1946/01/09 (76 y.o. Tonya Glass, Tonya Glass Primary Care Latrenda Irani: Tonya Glass Other Clinician: Referring Alla Sloma: Treating Randie Bloodgood/Extender: Tonya Glass in Treatment: 0 Encounter Discharge Information Items Post Procedure Vitals Discharge Condition: Stable Temperature (F): 97.7 Ambulatory Status: Wheelchair Pulse (bpm): 61 Discharge Destination: Home Respiratory Rate (breaths/min): 20 Transportation: Private Auto Blood Pressure (mmHg): 101/43 Accompanied By: self Schedule Follow-up Appointment: Yes Clinical Summary of Care: Electronic Signature(s) Signed: 09/20/2021 6:15:37 PM By: Tonya Pilling RN, BSN Entered By: Tonya Glass on 09/20/2021 10:32:31 -------------------------------------------------------------------------------- Lower Extremity Assessment Details Patient Name: Date of Service: Tonya Glass, Tonya Glass 09/20/2021 8:30 A M Medical Record Number: [id number] Patient Account Number: [id number] Date of Birth/Sex: Treating RN: 1945-06-27 (76 y.o. Tonya Glass, [name]: Tonya Glass Other Clinician: Referring Glynn Yepes: Treating Tonya Glass/Extender: Tonya Glass in Treatment: 0 Edema  Assessment Assessed: [name] Edema: [Left: Yes] [Right: Yes] Calf Left: Right: Point of Measurement: 32 cm From Medial Instep 36 cm 36 cm Ankle Left: Right: Point of Measurement: 8 cm From Medial Instep 26 cm 26 cm Knee To Floor Left: Right: From Medial Instep 36 cm 36 cm Vascular Assessment Pulses: Dorsalis Pedis Palpable: [Left:Yes] [Right:Yes] Doppler Audible: [Left:Yes] [Right:Yes] Posterior Tibial Palpable: [Left:Yes] [Right:Yes] Doppler Audible: [Left:Yes] [Right:Yes] Notes unable to obtain ABIs. Electronic Signature(s) Signed: 09/20/2021 6:15:37 PM By: Tonya Pilling RN, BSN Entered By: Tonya Glass on 09/20/2021 09:13:49 -------------------------------------------------------------------------------- [place] Details Patient Name: Date of Service: Tonya Glass, Tonya Glass 09/20/2021 8:30 A M Medical Record Number: [id number] Patient Account Number: [id number] Date of Birth/Sex: Treating RN: 26-May-1945 (76 y.o. Tonya Glass, Tonya Glass Primary Care Duwan Adrian: Tonya Glass Other Clinician: Referring Steven Basso: Treating Adalyn Pennock/Extender: Tonya Glass in Treatment: 0 Active Inactive Orientation to the Wound Care Program Nursing Diagnoses: Knowledge deficit related to the wound healing center program Goals: Patient/caregiver will verbalize understanding of the [place] Program Date Initiated: 09/20/2021 Target Resolution Date: 10/20/2021 Goal Status: Active Interventions: Provide education on orientation to the wound center Notes: Pressure Nursing Diagnoses: Potential for impaired tissue integrity related to pressure, friction, moisture, and shear Goals: Patient/caregiver will verbalize understanding of pressure ulcer management Date Initiated: 09/20/2021 Target Resolution Date: 10/20/2021 Goal Status: Active Interventions: Assess: immobility, friction, shearing, incontinence upon admission and as  needed Assess potential for pressure ulcer upon admission and as needed Provide education on pressure ulcers Treatment Activities: Pressure reduction/relief device ordered : 09/20/2021 T ordered outside of clinic : 09/20/2021 est Notes: Venous Leg Ulcer Nursing Diagnoses: Potential for venous Insuffiency (use before diagnosis confirmed) Goals: Patient will maintain optimal edema control Date Initiated: 09/20/2021 Target Resolution Date: 10/20/2021 Goal Status: Active Interventions: Assess peripheral edema status every visit. Compression as ordered Provide education on venous insufficiency Treatment Activities: Non-invasive vascular studies : 09/20/2021 Notes: Wound/Skin Impairment Nursing Diagnoses: Knowledge deficit related to ulceration/compromised skin integrity Goals: Patient/caregiver will verbalize understanding of skin care regimen Date Initiated: 09/20/2021 Target Resolution Date: 10/20/2021 Goal Status: Active Interventions: Assess patient/caregiver ability to perform ulcer/skin care regimen upon admission and as needed Assess ulceration(s) every visit Provide education on ulcer and skin care Treatment Activities: Skin care regimen  initiated : 09/20/2021 Topical wound management initiated : 09/20/2021 Notes: Electronic Signature(s) Signed: 09/20/2021 6:15:37 PM By: Tonya Pilling RN, BSN Entered By: Tonya Glass on 09/20/2021 09:38:56 -------------------------------------------------------------------------------- Pain Assessment Details Patient Name: Date of Service: Tonya Glass, Tonya Glass 09/20/2021 8:30 A M Medical Record Number: [id number] Patient Account Number: [id number] Date of Birth/Sex: Treating RN: 04/10/1946 (76 y.o. [name] Primary Care Jonel Weldon: Tonya Glass Other Clinician: Referring Brittin Janik: Treating Kerria Sapien/Extender: Tonya Glass in Treatment: 0 Active Problems Location of Pain Severity and Description of Pain Patient Has  Paino Yes Site Locations Pain Location: Generalized Pain, Pain in Ulcers Rate the pain. Current Pain Level: 7 Pain Management and Medication Current Pain Management: Medication: No Cold Application: No Rest: No Massage: No Activity: No T.E.N.S.: No Heat Application: No Leg drop or elevation: No Is the Current Pain Management Adequate: Adequate How does your wound impact your activities of daily livingo Sleep: No Bathing: No Appetite: No Relationship With Others: No Bladder Continence: No Emotions: No Bowel Continence: No Work: No Toileting: No Drive: No Dressing: No Hobbies: No Engineer, maintenance) Signed: 09/20/2021 6:15:37 PM By: Tonya Pilling RN, BSN Entered By: Tonya Glass on 09/20/2021 09:22:20 -------------------------------------------------------------------------------- Patient/Caregiver Education Details Patient Name: Date of Service: Jetty Peeks 5/10/2023andnbsp8:30 A M Medical Record Number: [id number] Patient Account Number: [id number] Date of Birth/Gender: Treating RN: [birth year] (76 y.o. Tonya Glass, Tonya Glass Primary Care Physician: Tonya Glass Other Clinician: Referring Physician: Treating Physician/Extender: Tonya Glass in Treatment: 0 Education Assessment Education Provided To: Patient Education Topics Provided Pressure: Handouts: Pressure Ulcers: Care and Offloading, Pressure Ulcers: Care and Offloading 2, Preventing Pressure Ulcers Methods: Explain/Verbal, Printed Responses: Reinforcements needed Venous: Handouts: Controlling Swelling with Compression Stockings , Controlling Swelling with Multilayered Compression Wraps Methods: Explain/Verbal, Printed Responses: Reinforcements needed [place]: o Handouts: Welcome T [facility] [place] o Methods: Explain/Verbal, Printed Responses: Reinforcements needed Electronic Signature(s) Signed: 09/20/2021 6:15:37 PM By: Tonya Pilling RN,  BSN Entered By: Tonya Glass on 09/20/2021 09:39:33 -------------------------------------------------------------------------------- Wound Assessment Details Patient Name: Date of Service: Tonya Glass, Tonya Glass 09/20/2021 8:30 A M Medical Record Number: [id number] Patient Account Number: [id number] Date of Birth/Sex: Treating RN: Oct 10, 1945 (76 y.o. Tonya Glass, [name] Primary Care Burlie Cajamarca: Tonya Glass Other Clinician: Referring Nigil Braman: Treating Wilson Sample/Extender: Tonya Glass in Treatment: 0 Wound Status Wound Number: 1 Primary Lymphedema Etiology: Wound Location: Left, Distal, Medial Lower Leg Secondary Diabetic Wound/Ulcer of the Lower Extremity Wounding Event: Blister Etiology: Date Acquired: 08/21/2021 Wound Open Weeks Of Treatment: 0 Status: Clustered Wound: No Comorbid Chronic Obstructive Pulmonary Disease (COPD), Congestive History: Heart Failure, Hypertension, Type II Diabetes, Rheumatoid Arthritis, Osteoarthritis Photos Wound Measurements Length: (cm) 2.2 Width: (cm) 3.5 Depth: (cm) 0.1 Area: (cm) 6.048 Volume: (cm) 0.605 % Reduction in Area: 0% % Reduction in Volume: 0% Epithelialization: Small (1-33%) Tunneling: No Undermining: No Wound Description Classification: Full Thickness Without Exposed Support Structures Wound Margin: Distinct, outline attached Exudate Amount: Medium Exudate Type: Serosanguineous Exudate Color: red, brown Foul Odor After Cleansing: No Slough/Fibrino Yes Wound Bed Granulation Amount: Large (67-100%) Exposed Structure Granulation Quality: Pink, Pale Fascia Exposed: No Necrotic Amount: Small (1-33%) Fat Layer (Subcutaneous Tissue) Exposed: Yes Necrotic Quality: Eschar Tendon Exposed: No Muscle Exposed: No Joint Exposed: No Bone Exposed: No Electronic Signature(s) Signed: 09/20/2021 6:15:37 PM By: Tonya Pilling RN, BSN Signed: 10/24/2021 8:46:54 AM By: Tonya Glass Entered By: Tonya Glass on  09/20/2021 09:09:48 -------------------------------------------------------------------------------- Wound Assessment Details  Patient Name: Date of Service: Tonya Glass, Tonya Glass 09/20/2021 8:30 A M Medical Record Number: [id number] Patient Account Number: [id number] Date of Birth/Sex: Treating RN: 1945-05-25 (76 y.o. [name] Primary Care Neoma Uhrich: Tonya Glass Other Clinician: Referring Takeshia Wenk: Treating Jona Erkkila/Extender: Tonya Glass in Treatment: 0 Wound Status Wound Number: 2 Primary Lymphedema Etiology: Wound Location: Left, Proximal, Medial Lower Leg Secondary Diabetic Wound/Ulcer of the Lower Extremity Wounding Event: Blister Etiology: Date Acquired: 08/21/2021 Wound Open Weeks Of Treatment: 0 Status: Clustered Wound: Yes Comorbid Chronic Obstructive Pulmonary Disease (COPD), Congestive History: Heart Failure, Hypertension, Type II Diabetes, Rheumatoid Arthritis, Osteoarthritis Photos Wound Measurements Length: (cm) 3 Width: (cm) 3 Depth: (cm) 0. Clustered Quantity: 2 Area: (cm) 7 Volume: (cm) 0 % Reduction in Area: 0% % Reduction in Volume: 0% 1 Epithelialization: Small (1-33%) Tunneling: No .069 Undermining: No .707 Wound Description Classification: Full Thickness Without Exposed Support Structures Wound Margin: Distinct, outline attached Exudate Amount: Medium Exudate Type: Serosanguineous Exudate Color: red, brown Foul Odor After Cleansing: No Slough/Fibrino No Wound Bed Granulation Amount: Large (67-100%) Exposed Structure Granulation Quality: Pink Fascia Exposed: No Necrotic Amount: None Present (0%) Fat Layer (Subcutaneous Tissue) Exposed: Yes Tendon Exposed: No Muscle Exposed: No Joint Exposed: No Bone Exposed: No Electronic Signature(s) Signed: 09/20/2021 6:15:37 PM By: Tonya Pilling RN, BSN Signed: 10/24/2021 8:46:54 AM By: Tonya Glass Entered By: Tonya Glass on 09/20/2021  09:10:30 -------------------------------------------------------------------------------- Wound Assessment Details Patient Name: Date of Service: Tonya Glass, Tonya Glass 09/20/2021 8:30 A M Medical Record Number: [id number] Patient Account Number: [id number] Date of Birth/Sex: Treating RN: 12-Jan-1946 (76 y.o. Tonya Glass, [name] Primary Care Abhi Moccia: Tonya Glass Other Clinician: Referring Nolita Kutter: Treating Naz Denunzio/Extender: Tonya Glass in Treatment: 0 Wound Status Wound Number: 3 Primary Lymphedema Etiology: Wound Location: Right, Proximal, Medial Lower Leg Secondary Diabetic Wound/Ulcer of the Lower Extremity Wounding Event: Blister Etiology: Date Acquired: 08/21/2021 Wound Open Weeks Of Treatment: 0 Status: Clustered Wound: No Comorbid Chronic Obstructive Pulmonary Disease (COPD), Congestive History: Heart Failure, Hypertension, Type II Diabetes, Rheumatoid Arthritis, Osteoarthritis Photos Wound Measurements Length: (cm) 1 Width: (cm) 1.5 Depth: (cm) 0.1 Area: (cm) 1.178 Volume: (cm) 0.118 % Reduction in Area: 0% % Reduction in Volume: 0% Epithelialization: None Tunneling: No Undermining: No Wound Description Classification: Full Thickness Without Exposed Support Structures Wound Margin: Distinct, outline attached Exudate Amount: Medium Exudate Type: Serosanguineous Exudate Color: red, brown Foul Odor After Cleansing: No Slough/Fibrino Yes Wound Bed Granulation Amount: None Present (0%) Exposed Structure Necrotic Amount: Large (67-100%) Fascia Exposed: No Necrotic Quality: Eschar Fat Layer (Subcutaneous Tissue) Exposed: Yes Tendon Exposed: No Muscle Exposed: No Joint Exposed: No Bone Exposed: No Electronic Signature(s) Signed: 09/20/2021 6:15:37 PM By: Tonya Pilling RN, BSN Signed: 10/24/2021 8:46:54 AM By: Tonya Glass Entered By: Tonya Glass on 09/20/2021  09:11:45 -------------------------------------------------------------------------------- Wound Assessment Details Patient Name: Date of Service: Tonya Glass, Geralynn 09/20/2021 8:30 A M Medical Record Number: [id number] Patient Account Number: [id number] Date of Birth/Sex: Treating RN: 04-09-1946 (76 y.o. Tonya Glass, Tonya Glass Primary Care Rosemond Lyttle: Tonya Glass Other Clinician: Referring Alexandre Lightsey: Treating Meiah Zamudio/Extender: Tonya Glass in Treatment: 0 Wound Status Wound Number: 4 Primary Lymphedema Etiology: Wound Location: Right, Distal, Medial Lower Leg Secondary Diabetic Wound/Ulcer of the Lower Extremity Wounding Event: Gradually Appeared Etiology: Date Acquired: 08/21/2021 Wound Open Weeks Of Treatment: 0 Status: Clustered Wound: No Comorbid Chronic Obstructive Pulmonary Disease (COPD), Congestive History: Heart Failure, Hypertension, Type II Diabetes, Rheumatoid Arthritis, Osteoarthritis Photos Wound Measurements Length: (  cm) 3.5 Width: (cm) 1 Depth: (cm) 0.1 Area: (cm) 2.749 Volume: (cm) 0.275 % Reduction in Area: 0% % Reduction in Volume: 0% Epithelialization: None Tunneling: No Undermining: No Wound Description Classification: Full Thickness Without Exposed Support Structures Wound Margin: Distinct, outline attached Exudate Amount: Medium Exudate Type: Serosanguineous Exudate Color: red, brown Foul Odor After Cleansing: No Slough/Fibrino Yes Wound Bed Granulation Amount: None Present (0%) Exposed Structure Necrotic Amount: Large (67-100%) Fascia Exposed: No Necrotic Quality: Eschar Fat Layer (Subcutaneous Tissue) Exposed: No Tendon Exposed: No Muscle Exposed: No Joint Exposed: No Bone Exposed: No Electronic Signature(s) Signed: 09/20/2021 6:15:37 PM By: Tonya Pilling RN, BSN Signed: 10/24/2021 8:46:54 AM By: Tonya Glass Entered By: Tonya Glass on 09/20/2021  09:12:27 -------------------------------------------------------------------------------- Wound Assessment Details Patient Name: Date of Service: Tonya Glass, Dalphine 09/20/2021 8:30 A M Medical Record Number: [id number] Patient Account Number: [id number] Date of Birth/Sex: Treating RN: March 19, 1946 (76 y.o. Tonya Glass, [name] Primary Care Mio Schellinger: Tonya Glass Other Clinician: Referring Rafan Sanders: Treating Caedence Snowden/Extender: Tonya Glass in Treatment: 0 Wound Status Wound Number: 5 Primary Lymphedema Etiology: Wound Location: Right, Lateral Lower Leg Secondary Diabetic Wound/Ulcer of the Lower Extremity Wounding Event: Blister Etiology: Date Acquired: 09/15/2021 Wound Open Weeks Of Treatment: 0 Status: Clustered Wound: No Comorbid Chronic Obstructive Pulmonary Disease (COPD), Congestive History: Heart Failure, Hypertension, Type II Diabetes, Rheumatoid Arthritis, Osteoarthritis Photos Wound Measurements Length: (cm) 2 Width: (cm) 2 Depth: (cm) 0.1 Area: (cm) 3.142 Volume: (cm) 0.314 % Reduction in Area: 0% % Reduction in Volume: 0% Epithelialization: Small (1-33%) Tunneling: No Undermining: No Wound Description Classification: Full Thickness Without Exposed Support Structures Wound Margin: Distinct, outline attached Exudate Amount: Medium Exudate Type: Serosanguineous Exudate Color: red, brown Foul Odor After Cleansing: No Slough/Fibrino No Wound Bed Granulation Amount: Large (67-100%) Exposed Structure Granulation Quality: Pink, Pale Fascia Exposed: No Necrotic Amount: None Present (0%) Fat Layer (Subcutaneous Tissue) Exposed: Yes Tendon Exposed: No Muscle Exposed: No Joint Exposed: No Bone Exposed: No Electronic Signature(s) Signed: 09/20/2021 6:15:37 PM By: Tonya Pilling RN, BSN Signed: 10/24/2021 8:46:54 AM By: Tonya Glass Entered By: Tonya Glass on 09/20/2021  09:13:34 -------------------------------------------------------------------------------- Wound Assessment Details Patient Name: Date of Service: Tonya Glass, Nhu 09/20/2021 8:30 A M Medical Record Number: [id number] Patient Account Number: [id number] Date of Birth/Sex: Treating RN: 04/20/1946 (76 y.o. Tonya Glass, [name] Primary Care Maycee Blasco: Tonya Glass Other Clinician: Referring Chigozie Basaldua: Treating Helmi Hechavarria/Extender: Tonya Glass in Treatment: 0 Wound Status Wound Number: 6 Primary Pressure Ulcer Etiology: Wound Location: Left Gluteus Wound Open Wounding Event: Pressure Injury Status: Date Acquired: 08/29/2021 Comorbid Chronic Obstructive Pulmonary Disease (COPD), Congestive Weeks Of Treatment: 0 History: Heart Failure, Hypertension, Type II Diabetes, Rheumatoid Clustered Wound: No Arthritis, Osteoarthritis Photos Wound Measurements Length: (cm) 1.6 Width: (cm) 0.6 Depth: (cm) 0.2 Area: (cm) 0.754 Volume: (cm) 0.151 % Reduction in Area: 0% % Reduction in Volume: 0% Epithelialization: None Tunneling: No Undermining: No Wound Description Classification: Unstageable/Unclassified Wound Margin: Distinct, outline attached Exudate Amount: Medium Exudate Type: Serosanguineous Exudate Color: red, brown Foul Odor After Cleansing: No Slough/Fibrino Yes Wound Bed Granulation Amount: None Present (0%) Exposed Structure Necrotic Amount: Large (67-100%) Fascia Exposed: No Necrotic Quality: Adherent Slough Fat Layer (Subcutaneous Tissue) Exposed: No Tendon Exposed: No Muscle Exposed: No Joint Exposed: No Bone Exposed: No Electronic Signature(s) Signed: 09/20/2021 6:15:37 PM By: Tonya Pilling RN, BSN Signed: 10/24/2021 8:46:54 AM By: Tonya Glass Entered By: Tonya Glass on 09/20/2021 09:18:51 -------------------------------------------------------------------------------- [place] Details Patient Name: Date of Service: Lurena Nida  RENIA, MIKELSON 09/20/2021  8:30 A M Medical Record Number: [id number] Patient Account Number: [id number] Date of Birth/Sex: Treating RN: 02-02-1946 (76 y.o. Tonya Glass, Tonya Glass Primary Care Sadiq Mccauley: Tonya Glass Other Clinician: Referring Gabryelle Whitmoyer: Treating Shaelee Forni/Extender: Tonya Glass in Treatment: 0 Vital Signs Time Taken: 08:38 Temperature (F): 97.7 Height (in): 60 Pulse (bpm): 61 Source: Stated Respiratory Rate (breaths/min): 16 Weight (lbs): 202 Blood Pressure (mmHg): 101/43 Source: Stated Reference Range: 80 - 120 mg / dl Body Mass Index (BMI): 39.4 Electronic Signature(s) Signed: 10/24/2021 8:46:54 AM By: Tonya Glass Entered By: Tonya Glass on 09/20/2021 08:40:16

## 2021-10-24 NOTE — Progress Notes (Signed)
Tonya Glass, Tonya Glass ([id number]) Visit Report for 09/20/2021 Abuse Risk Screen Details Patient Name: Date of Service: Tonya Glass, Tonya Glass 09/20/2021 8:30 A M Medical Record Number: [id number] Patient Account Number: [id number] Date of Birth/Sex: Treating RN: 1946-04-20 (76 y.o. Tonya Glass Primary Care Tonya Glass: Tonya Glass Other Clinician: Referring [name]: Treating Tonya Glass/Extender: Tonya Glass in Treatment: 0 Abuse Risk Screen Items Answer ABUSE RISK SCREEN: Has anyone close to you tried to hurt or harm you recentlyo No Do you feel uncomfortable with anyone in your familyo No Has anyone forced you do things that you didnt want to doo No Electronic Signature(s) Signed: 09/20/2021 5:07:52 PM By: Rhae Hammock RN Signed: 10/24/2021 8:46:54 AM By: Erenest Blank Entered By: Erenest Blank on 09/20/2021 08:47:54 -------------------------------------------------------------------------------- Activities of Daily Living Details Patient Name: Date of Service: Tonya Glass, Tonya Glass 09/20/2021 8:30 A M Medical Record Number: [id number] Patient Account Number: [id number] Date of Birth/Sex: Treating RN: 07/04/1945 (76 y.o. Tonya Glass Primary Care Amer Alcindor: Tonya Glass Other Clinician: Referring [name]: Treating [name]/Extender: Tonya Glass in Treatment: 0 Activities of Daily Living Items Answer Activities of Daily Living (Please select one for each item) Drive Automobile Completely Able T Medications ake Completely Able Use T elephone Completely Able Care for Appearance Completely Able Use T oilet Completely Able Bath / Shower Completely Able Dress Self Completely Able Feed Self Completely Able Walk Need Assistance Get In / Out Bed Completely Able Housework Completely Able Prepare Meals Completely [place] for Self Completely Able Electronic Signature(s) Signed: 09/20/2021 5:07:52 PM By:  Rhae Hammock RN Signed: 10/24/2021 8:46:54 AM By: Erenest Blank Entered By: Erenest Blank on 09/20/2021 08:48:39 -------------------------------------------------------------------------------- Education Screening Details Patient Name: Date of Service: Tonya Glass, Tonya Glass 09/20/2021 8:30 A M Medical Record Number: [id number] Patient Account Number: [id number] Date of Birth/Sex: Treating RN: 15-Mar-1946 (76 y.o. Tonya Glass Primary Care Naya Ilagan: Tonya Glass Other Clinician: Referring [name]: Treating [name]/Extender: Tonya Glass in Treatment: 0 Primary Learner Assessed: Patient Learning Preferences/Education Level/Primary Language Learning Preference: Explanation, Demonstration, Printed Material Highest Education Level: College or Above Preferred Language: English Cognitive Barrier Language Barrier: No Translator Needed: No Memory Deficit: No Emotional Barrier: No Cultural/Religious Beliefs Affecting Medical Care: No Physical Barrier Impaired Vision: Yes Glasses Impaired Hearing: No Knowledge/Comprehension Knowledge Level: High Comprehension Level: High Ability to understand written instructions: High Ability to understand verbal instructions: High Motivation Anxiety Level: Calm Cooperation: Cooperative Education Importance: Acknowledges Need Interest in Health Problems: Asks Questions Perception: Coherent Willingness to Engage in Self-Management High Activities: Readiness to Engage in Self-Management High Activities: Electronic Signature(s) Signed: 09/20/2021 5:07:52 PM By: Rhae Hammock RN Signed: 10/24/2021 8:46:54 AM By: Erenest Blank Entered By: Erenest Blank on 09/20/2021 08:50:22 -------------------------------------------------------------------------------- Fall Risk Assessment Details Patient Name: Date of Service: Tonya Glass, Tonya Glass 09/20/2021 8:30 A M Medical Record Number: [id number] Patient Account Number:  [id number] Date of Birth/Sex: Treating RN: 1946-04-05 (76 y.o. Tonya Glass Primary Care [name]: Tonya Glass Other Clinician: Referring [name]: Treating [name]/Extender: Tonya Glass in Treatment: 0 Fall Risk Assessment Items Have you had 2 or more falls in the last 12 monthso 0 Yes Have you had any fall that resulted in injury in the last 12 monthso 0 No FALLS RISK SCREEN History of falling - immediate or within 3 months 0 No Secondary diagnosis (Do you have 2 or more medical diagnoseso) 0 No Ambulatory aid None/bed rest/wheelchair/nurse 0 No  Crutches/cane/walker 15 Yes Furniture 0 No Intravenous therapy Access/Saline/Heparin Lock 0 No Gait/Transferring Normal/ bed rest/ wheelchair 0 No Weak (short steps with or without shuffle, stooped but able to lift head while walking, may seek 10 Yes support from furniture) Impaired (short steps with shuffle, may have difficulty arising from chair, head down, impaired 0 No balance) Mental Status Oriented to own ability 0 Yes Electronic Signature(s) Signed: 09/20/2021 5:07:52 PM By: Rhae Hammock RN Signed: 10/24/2021 8:46:54 AM By: Erenest Blank Entered By: Erenest Blank on 09/20/2021 08:51:56 -------------------------------------------------------------------------------- Foot Assessment Details Patient Name: Date of Service: Tonya Glass, Tonya Glass 09/20/2021 8:30 A M Medical Record Number: [id number] Patient Account Number: [id number] Date of Birth/Sex: Treating RN: 1946-02-01 (76 y.o. Tonya Glass Primary Care Carlyn Lemke: Tonya Glass Other Clinician: Referring [name]: Treating [name]/Extender: Tonya Glass in Treatment: 0 Foot Assessment Items Site Locations + = Sensation present, - = Sensation absent, C = Callus, U = Ulcer R = Redness, W = Warmth, M = Maceration, PU = Pre-ulcerative lesion F = Fissure, S = Swelling, D = Dryness Assessment Right: Left: Other  Deformity: No No Prior Foot Ulcer: No No Prior Amputation: No No Charcot Joint: No No Ambulatory Status: Ambulatory With Help Assistance Device: Walker Gait: Administrator, arts) Signed: 09/20/2021 6:15:37 PM By: Deon Pilling RN, BSN Entered By: Deon Pilling on 09/20/2021 09:12:55 -------------------------------------------------------------------------------- Nutrition Risk Screening Details Patient Name: Date of Service: Tonya Glass, Tonya Glass 09/20/2021 8:30 A M Medical Record Number: [id number] Patient Account Number: [id number] Date of Birth/Sex: Treating RN: 1945-06-22 (76 y.o. Tonya Glass Primary Care Loukas Antonson: Tonya Glass Other Clinician: Referring Olamide Lahaie: Treating Fredericka Bottcher/Extender: Tonya Glass in Treatment: 0 Height (in): 60 Weight (lbs): 202 Body Mass Index (BMI): 39.4 Nutrition Risk Screening Items Score Screening NUTRITION RISK SCREEN: I have an illness or condition that made me change the kind and/or amount of food I eat 2 Yes I eat fewer than two meals per day 0 No I eat few fruits and vegetables, or milk products 2 Yes I have three or more drinks of beer, liquor or wine almost every day 0 No I have tooth or mouth problems that make it hard for me to eat 0 No I don't always have enough money to buy the food I need 0 No I eat alone most of the time 1 Yes I take three or more different prescribed or over-the-counter drugs a day 1 Yes Without wanting to, I have lost or gained 10 pounds in the last six months 0 No I am not always physically able to shop, cook and/or feed myself 0 No Nutrition Protocols Good Risk Protocol Moderate Risk Protocol Provide education on elevated blood High Risk Proctocol 0 sugars and impact on wound healing, as applicable Risk Level: High Risk Score: 6 Electronic Signature(s) Signed: 09/20/2021 5:07:52 PM By: Rhae Hammock RN Signed: 10/24/2021 8:46:54 AM By: Erenest Blank Entered By:  Erenest Blank on 09/20/2021 08:52:58

## 2021-10-24 NOTE — Progress Notes (Signed)
MAR, ZETTLER ([id number]) Visit Report for 09/20/2021 Chief Complaint Document Details Patient Name: Date of Service: Tonya Glass, Tonya Glass 09/20/2021 8:30 A M Medical Record Number: [id number] Patient Account Number: [id number] Date of Birth/Sex: Treating RN: 08-20-1945 (76 y.o. Tonya Glass, [name] Provider: [name] Other Clinician: Referring Provider: Treating Provider/Extender: Darlen Round in Treatment: 0 Information Obtained from: Patient Chief Complaint Bilateral LE Ulcers and left gluteal ulcer Electronic Signature(s) Signed: 09/20/2021 9:33:23 AM By: Worthy Keeler PA-C Entered By: Worthy Keeler on 09/20/2021 09:33:23 -------------------------------------------------------------------------------- Debridement Details Patient Name: Date of Service: Tonya Glass 09/20/2021 8:30 A M Medical Record Number: [id number] Patient Account Number: [id number] Date of Birth/Sex: Treating RN: 1946-03-11 (76 y.o. Tonya Glass, [name] Primary Care Provider: [name] Other Clinician: Referring Provider: Treating Provider/Extender: Darlen Round in Treatment: 0 Debridement Performed for Assessment: Wound #6 Left Gluteus Performed By: Clinician Deon Pilling, RN Debridement Type: Chemical/Enzymatic/Mechanical Agent Used: gauze and wound cleanser Level of Consciousness (Pre-procedure): Awake and Alert Pre-procedure Verification/Time Out No Taken: Bleeding: None Response to Treatment: Procedure was tolerated well Level of Consciousness (Post- Awake and Alert procedure): Post Debridement Measurements of Total Wound Length: (cm) 1.6 Stage: Unstageable/Unclassified Width: (cm) 0.6 Depth: (cm) 0.2 Volume: (cm) 0.151 Character of Wound/Ulcer Post Debridement: Stable Post Procedure Diagnosis Same as Pre-procedure Electronic Signature(s) Signed: 09/20/2021 6:03:33 PM By: Worthy Keeler PA-C Signed: 09/20/2021 6:15:37 PM By:  Deon Pilling RN, BSN Entered By: Deon Pilling on 09/20/2021 09:54:35 -------------------------------------------------------------------------------- Debridement Details Patient Name: Date of Service: Tonya Glass, Tonya Glass 09/20/2021 8:30 A M Medical Record Number: [id number] Patient Account Number: [id number] Date of Birth/Sex: Treating RN: 03-22-1946 (76 y.o. Tonya Glass, [name] Primary Care Provider: [name] Other Clinician: Referring Provider: Treating Provider/Extender: Darlen Round in Treatment: 0 Debridement Performed for Assessment: Wound #5 Right,Lateral Lower Leg Performed By: Physician Worthy Keeler, PA Debridement Type: Chemical/Enzymatic/Mechanical Agent Used: gauze and wound cleanser Severity of Tissue Pre Debridement: Limited to breakdown of skin Level of Consciousness (Pre-procedure): Awake and Alert Pre-procedure Verification/Time Out No Taken: Bleeding: None Response to Treatment: Procedure was tolerated well Level of Consciousness (Post- Awake and Alert procedure): Post Debridement Measurements of Total Wound Length: (cm) 2 Width: (cm) 2 Depth: (cm) 0.1 Volume: (cm) 0.314 Character of Wound/Ulcer Post Debridement: Stable Severity of Tissue Post Debridement: Limited to breakdown of skin Post Procedure Diagnosis Same as Pre-procedure Electronic Signature(s) Signed: 09/20/2021 6:03:33 PM By: Worthy Keeler PA-C Signed: 09/20/2021 6:15:37 PM By: Deon Pilling RN, BSN Entered By: Deon Pilling on 09/20/2021 09:58:45 -------------------------------------------------------------------------------- Debridement Details Patient Name: Date of Service: Tonya Glass, Tonya Glass 09/20/2021 8:30 A M Medical Record Number: [id number] Patient Account Number: [id number] Date of Birth/Sex: Treating RN: 06/22/1945 (76 y.o. Tonya Glass, [name] Primary Care Provider: [name] Other Clinician: Referring Provider: Treating Provider/Extender: Darlen Round in Treatment: 0 Debridement Performed for Assessment: Wound #1 Left,Distal,Medial Lower Leg Performed By: Physician Worthy Keeler, PA Debridement Type: Chemical/Enzymatic/Mechanical Agent Used: gauze and wound cleanser Severity of Tissue Pre Debridement: Fat layer exposed Level of Consciousness (Pre-procedure): Awake and Alert Pre-procedure Verification/Time Out No Taken: Bleeding: None Response to Treatment: Procedure was tolerated well Level of Consciousness (Post- Awake and Alert procedure): Post Debridement Measurements of Total Wound Length: (cm) 2.2 Width: (cm) 2.5 Depth: (cm) 0.1 Volume: (cm) 0.432 Character of Wound/Ulcer Post Debridement: Stable Severity of Tissue Post Debridement: Fat layer exposed Post Procedure  Diagnosis Same as Pre-procedure Electronic Signature(s) Signed: 09/20/2021 6:03:33 PM By: Worthy Keeler PA-C Signed: 09/20/2021 6:15:37 PM By: Deon Pilling RN, BSN Entered By: Deon Pilling on 09/20/2021 09:59:11 -------------------------------------------------------------------------------- Debridement Details Patient Name: Date of Service: Tonya Glass, Tonya Glass 09/20/2021 8:30 A M Medical Record Number: [id number] Patient Account Number: [id number] Date of Birth/Sex: Treating RN: 30-Sep-1945 (76 y.o. Tonya Glass, [name] Primary Care Provider: [name] Other Clinician: Referring Provider: Treating Provider/Extender: Darlen Round in Treatment: 0 Debridement Performed for Assessment: Wound #2 Left,Proximal,Medial Lower Leg Performed By: Clinician Deon Pilling, RN Debridement Type: Chemical/Enzymatic/Mechanical Agent Used: gauze and wound cleanser Severity of Tissue Pre Debridement: Limited to breakdown of skin Level of Consciousness (Pre-procedure): Awake and Alert Pre-procedure Verification/Time Out No Taken: Bleeding: None Response to Treatment: Procedure was tolerated well Level of Consciousness (Post-  Awake and Alert procedure): Post Debridement Measurements of Total Wound Length: (cm) 3 Width: (cm) 3 Depth: (cm) 0.1 Volume: (cm) 0.707 Character of Wound/Ulcer Post Debridement: Requires Further Debridement Severity of Tissue Post Debridement: Limited to breakdown of skin Post Procedure Diagnosis Same as Pre-procedure Electronic Signature(s) Signed: 09/20/2021 6:03:33 PM By: Worthy Keeler PA-C Signed: 09/20/2021 6:15:37 PM By: Deon Pilling RN, BSN Entered By: Deon Pilling on 09/20/2021 09:59:40 -------------------------------------------------------------------------------- Debridement Details Patient Name: Date of Service: Tonya Glass, Tonya Glass 09/20/2021 8:30 A M Medical Record Number: [id number] Patient Account Number: [id number] Date of Birth/Sex: Treating RN: February 26, 1946 (76 y.o. Tonya Glass, [name] Primary Care Provider: [name] Other Clinician: Referring Provider: Treating Provider/Extender: Darlen Round in Treatment: 0 Debridement Performed for Assessment: Wound #3 Right,Proximal,Medial Lower Leg Performed By: Clinician Deon Pilling, RN Debridement Type: Chemical/Enzymatic/Mechanical Agent Used: gauze and wound cleanser Severity of Tissue Pre Debridement: Limited to breakdown of skin Level of Consciousness (Pre-procedure): Awake and Alert Pre-procedure Verification/Time Out No Taken: Bleeding: None Response to Treatment: Procedure was tolerated well Level of Consciousness (Post- Awake and Alert procedure): Post Debridement Measurements of Total Wound Length: (cm) 1 Width: (cm) 1.5 Depth: (cm) 0.1 Volume: (cm) 0.118 Character of Wound/Ulcer Post Debridement: Stable Severity of Tissue Post Debridement: Limited to breakdown of skin Post Procedure Diagnosis Same as Pre-procedure Electronic Signature(s) Signed: 09/20/2021 6:03:33 PM By: Worthy Keeler PA-C Signed: 09/20/2021 6:15:37 PM By: Deon Pilling RN, BSN Entered By: Deon Pilling on  09/20/2021 10:00:04 -------------------------------------------------------------------------------- Debridement Details Patient Name: Date of Service: Tonya Glass, Tonya Glass 09/20/2021 8:30 A M Medical Record Number: [id number] Patient Account Number: [id number] Date of Birth/Sex: Treating RN: 07/22/1945 (76 y.o. Tonya Glass, [name] Primary Care Provider: [name] Other Clinician: Referring Provider: Treating Provider/Extender: Darlen Round in Treatment: 0 Debridement Performed for Assessment: Wound #4 Right,Distal,Medial Lower Leg Performed By: Clinician Deon Pilling, RN Debridement Type: Chemical/Enzymatic/Mechanical Agent Used: gauze and wound cleanser Severity of Tissue Pre Debridement: Limited to breakdown of skin Level of Consciousness (Pre-procedure): Awake and Alert Pre-procedure Verification/Time Out No Taken: Bleeding: None Response to Treatment: Procedure was tolerated well Level of Consciousness (Post- Awake and Alert procedure): Post Debridement Measurements of Total Wound Length: (cm) 3.5 Width: (cm) 1 Depth: (cm) 0.1 Volume: (cm) 0.275 Character of Wound/Ulcer Post Debridement: Stable Severity of Tissue Post Debridement: Limited to breakdown of skin Post Procedure Diagnosis Same as Pre-procedure Electronic Signature(s) Signed: 09/20/2021 6:03:33 PM By: Worthy Keeler PA-C Signed: 09/20/2021 6:15:37 PM By: Deon Pilling RN, BSN Entered By: Deon Pilling on 09/20/2021 10:00:32 -------------------------------------------------------------------------------- HPI Details Patient Name: Date of Service: Tonya Glass  Tonya Glass, Tonya Glass 09/20/2021 8:30 A M Medical Record Number: [id number] Patient Account Number: [id number] Date of Birth/Sex: Treating RN: 08/11/1945 (76 y.o. Tonya Glass, [name] Provider: [name] Other Clinician: Referring Provider: Treating Provider/Extender: Darlen Round in Treatment: 0 History of  Present Illness HPI Description: 09-22-2021 upon evaluation today patient presents with multiple lower extremity ulcerations of the bilateral lower extremities which have been present for a little over a month in general for most of these although some are new or. Most began around mid April time. Nonetheless the patient does have lymphedema which I think is contributing to the issues here. Fortunately there does not appear to be any evidence of infection at this time which is great news. The patient does have wounds over again both lower extremities. There is a history of lymphedema, peripheral vascular disease, atrial fibrillation, hypertension, long-term use of anticoagulant therapy, a cardiac pacemaker, and the patient has had a heart valve replacement. Electronic Signature(s) Signed: 09/22/2021 6:10:37 PM By: Worthy Keeler PA-C Entered By: Worthy Keeler on 09/22/2021 18:10:37 -------------------------------------------------------------------------------- Physical Exam Details Patient Name: Date of Service: Tonya Glass, Tonya Glass 09/20/2021 8:30 A M Medical Record Number: [id number] Patient Account Number: [id number] Date of Birth/Sex: Treating RN: January 02, 1946 (76 y.o. Tonya Glass, [name] Provider: [name] Other Clinician: Referring Provider: Treating Provider/Extender: Darlen Round in Treatment: 0 Constitutional sitting or standing blood pressure is within target range for patient.. pulse regular and within target range for patient.Marland Kitchen respirations regular, non-labored and within target range for patient.Marland Kitchen temperature within target range for patient.. Chronically ill appearing but in no apparent acute distress. Eyes conjunctiva clear no eyelid edema noted. pupils equal round and reactive to light and accommodation. Ears, Nose, Mouth, and Throat no gross abnormality of ear auricles or external auditory canals. normal hearing noted during conversation.  mucus membranes moist. Respiratory normal breathing without difficulty. Cardiovascular 2+ dorsalis pedis/posterior tibialis pulses. no clubbing, cyanosis, significant edema, <3 sec cap refill. Musculoskeletal normal gait and posture. no significant deformity or arthritic changes, no loss or range of motion, no clubbing. Psychiatric this patient is able to make decisions and demonstrates good insight into disease process. Alert and Oriented x 3. pleasant and cooperative. Notes Upon inspection patient's wound bed actually showed signs of most locations of doing well. She was having quite a bit of discomfort unfortunately and I think that is the biggest issue that I see right now. No sharp debridement was performed due to the fact that the patient is having again quite a bit of discomfort. Electronic Signature(s) Signed: 09/22/2021 6:13:05 PM By: Worthy Keeler PA-C Entered By: Worthy Keeler on 09/22/2021 18:13:05 -------------------------------------------------------------------------------- Physician Orders Details Patient Name: Date of Service: Tonya Glass, Tonya Glass 09/20/2021 8:30 A M Medical Record Number: [id number] Patient Account Number: [id number] Date of Birth/Sex: Treating RN: 1945/12/14 (76 y.o. Tonya Glass, [name] Primary Care Provider: [name] Other Clinician: Referring Provider: Treating Provider/Extender: Darlen Round in Treatment: 0 Verbal / Phone Orders: No Diagnosis Coding ICD-10 Coding Code Description I89.0 Lymphedema, not elsewhere classified I73.89 Other specified peripheral vascular diseases L97.822 Non-pressure chronic ulcer of other part of left lower leg with fat layer exposed L97.812 Non-pressure chronic ulcer of other part of right lower leg with fat layer exposed L89.320 Pressure ulcer of left buttock, unstageable I48.0 Paroxysmal atrial fibrillation I10 Essential (primary) hypertension Z79.01 Long term (current) use of  anticoagulants Z95.0 Presence of cardiac pacemaker  Z95.4 Presence of other heart-valve replacement Follow-up Appointments ppointment in 1 week. Jeri Cos, PA and [place] room 9 Wednesday 09/27/2021 315pm Return A Other: - [facility] will call you to perform arterial studies for both legs. They will call you to schedule. Bathing/ Shower/ Hygiene May shower and wash wound with soap and water. - with dressings. Edema Control - Lymphedema / SCD / Other Elevate legs to the level of the heart or above for 30 minutes daily and/or when sitting, a frequency of: - 3-4 times a day throughout the day. Avoid standing for long periods of time. Off-Loading Turn and reposition every 2 hours Other: - ensure to get up to stand and walk to limit pressure to buttock wound. Wound Treatment Wound #1 - Lower Leg Wound Laterality: Left, Medial, Distal Cleanser: Soap and Water 3 x Per Week/30 Days Discharge Instructions: May shower and wash wound with dial antibacterial soap and water prior to dressing change. Prim Dressing: KerraCel Ag Gelling Fiber Dressing, 2x2 in (silver alginate) (DME) (Generic) 3 x Per Week/30 Days ary Discharge Instructions: Apply silver alginate to wound bed as instructed Secondary Dressing: ABD Pad, 5x9 (DME) (Generic) 3 x Per Week/30 Days Discharge Instructions: Apply over primary dressing as directed. Secondary Dressing: Woven Gauze Sponge, Non-Sterile 4x4 in (DME) (Generic) 3 x Per Week/30 Days Discharge Instructions: Apply over primary dressing as directed. Secured With: Elastic Bandage 4 inch (ACE bandage) (DME) (Generic) 3 x Per Week/30 Days Discharge Instructions: Secure with ACE bandage as directed. Secured With: [employer], 4.5x3.1 (in/yd) (DME) (Generic) 3 x Per Week/30 Days Discharge Instructions: Secure with Kerlix as directed. Secured With: 60M Medipore H Soft Cloth Surgical Tape, 4 x 10 (in/yd) (DME) (Generic) 3 x Per Week/30 Days Discharge Instructions:  Secure with tape as directed. Wound #2 - Lower Leg Wound Laterality: Left, Medial, Proximal Cleanser: Soap and Water 3 x Per Week/30 Days Discharge Instructions: May shower and wash wound with dial antibacterial soap and water prior to dressing change. Prim Dressing: KerraCel Ag Gelling Fiber Dressing, 2x2 in (silver alginate) (DME) (Generic) 3 x Per Week/30 Days ary Discharge Instructions: Apply silver alginate to wound bed as instructed Secondary Dressing: ABD Pad, 5x9 (DME) (Generic) 3 x Per Week/30 Days Discharge Instructions: Apply over primary dressing as directed. Secondary Dressing: Woven Gauze Sponge, Non-Sterile 4x4 in (DME) (Generic) 3 x Per Week/30 Days Discharge Instructions: Apply over primary dressing as directed. Secured With: Elastic Bandage 4 inch (ACE bandage) (DME) (Generic) 3 x Per Week/30 Days Discharge Instructions: Secure with ACE bandage as directed. Secured With: [employer], 4.5x3.1 (in/yd) (DME) (Generic) 3 x Per Week/30 Days Discharge Instructions: Secure with Kerlix as directed. Secured With: 60M Medipore H Soft Cloth Surgical T ape, 4 x 10 (in/yd) (DME) (Generic) 3 x Per Week/30 Days Discharge Instructions: Secure with tape as directed. Wound #3 - Lower Leg Wound Laterality: Right, Medial, Proximal Cleanser: Soap and Water 3 x Per Week/30 Days Discharge Instructions: May shower and wash wound with dial antibacterial soap and water prior to dressing change. Prim Dressing: betadine 3 x Per Week/30 Days ary Discharge Instructions: purchase over the counter and apply directly to wound. Secondary Dressing: ABD Pad, 5x9 (DME) (Generic) 3 x Per Week/30 Days Discharge Instructions: Apply over primary dressing as directed. Secured With: Elastic Bandage 4 inch (ACE bandage) (DME) (Generic) 3 x Per Week/30 Days Discharge Instructions: Secure with ACE bandage as directed. Secured With: [employer], 4.5x3.1 (in/yd) (DME) (Generic) 3 x Per  Week/30  Days Discharge Instructions: Secure with Kerlix as directed. Secured With: 75M Medipore H Soft Cloth Surgical T ape, 4 x 10 (in/yd) (DME) (Generic) 3 x Per Week/30 Days Discharge Instructions: Secure with tape as directed. Wound #4 - Lower Leg Wound Laterality: Right, Medial, Distal Cleanser: Soap and Water 3 x Per Week/30 Days Discharge Instructions: May shower and wash wound with dial antibacterial soap and water prior to dressing change. Prim Dressing: betadine 3 x Per Week/30 Days ary Discharge Instructions: purchase over the counter and apply directly to wound. Secondary Dressing: ABD Pad, 5x9 (DME) (Generic) 3 x Per Week/30 Days Discharge Instructions: Apply over primary dressing as directed. Secured With: Elastic Bandage 4 inch (ACE bandage) (DME) (Generic) 3 x Per Week/30 Days Discharge Instructions: Secure with ACE bandage as directed. Secured With: [employer], 4.5x3.1 (in/yd) (DME) (Generic) 3 x Per Week/30 Days Discharge Instructions: Secure with Kerlix as directed. Secured With: 75M Medipore H Soft Cloth Surgical T ape, 4 x 10 (in/yd) (DME) (Generic) 3 x Per Week/30 Days Discharge Instructions: Secure with tape as directed. Wound #5 - Lower Leg Wound Laterality: Right, Lateral Cleanser: Soap and Water 3 x Per Week/30 Days Discharge Instructions: May shower and wash wound with dial antibacterial soap and water prior to dressing change. Prim Dressing: KerraCel Ag Gelling Fiber Dressing, 2x2 in (silver alginate) (DME) (Generic) 3 x Per Week/30 Days ary Discharge Instructions: Apply silver alginate to wound bed as instructed Secondary Dressing: ABD Pad, 5x9 (DME) (Generic) 3 x Per Week/30 Days Discharge Instructions: Apply over primary dressing as directed. Secondary Dressing: Woven Gauze Sponge, Non-Sterile 4x4 in (DME) (Generic) 3 x Per Week/30 Days Discharge Instructions: Apply over primary dressing as directed. Secured With: Elastic Bandage 4 inch (ACE bandage) (DME)  (Generic) 3 x Per Week/30 Days Discharge Instructions: Secure with ACE bandage as directed. Secured With: [employer], 4.5x3.1 (in/yd) (DME) (Generic) 3 x Per Week/30 Days Discharge Instructions: Secure with Kerlix as directed. Secured With: 75M Medipore H Soft Cloth Surgical T ape, 4 x 10 (in/yd) (DME) (Generic) 3 x Per Week/30 Days Discharge Instructions: Secure with tape as directed. Wound #6 - Gluteus Wound Laterality: Left Cleanser: Soap and Water Every Other Day/30 Days Discharge Instructions: May shower and wash wound with dial antibacterial soap and water prior to dressing change. Peri-Wound Care: Skin Prep (DME) (Generic) Every Other Day/30 Days Discharge Instructions: Use skin prep as directed Prim Dressing: KerraCel Ag Gelling Fiber Dressing, 2x2 in (silver alginate) (DME) (Generic) Every Other Day/30 Days ary Discharge Instructions: Apply silver alginate to wound bed as instructed Secondary Dressing: Zetuvit Plus Silicone Border Dressing 4x4 (in/in) (DME) (Generic) Every Other Day/30 Days Discharge Instructions: Apply silicone border over primary dressing as directed. Services and Therapies rterial Studies- Bilateral - [facility] office will call you to schedule your appt time for the arterial studies. A Electronic Signature(s) Signed: 09/20/2021 6:03:33 PM By: Worthy Keeler PA-C Signed: 09/20/2021 6:15:37 PM By: Deon Pilling RN, BSN Entered By: Deon Pilling on 09/20/2021 10:07:57 -------------------------------------------------------------------------------- Problem List Details Patient Name: Date of Service: Tonya Glass, [place] 09/20/2021 8:30 A M Medical Record Number: [id number] Patient Account Number: [id number] Date of Birth/Sex: Treating RN: 25-Jul-1945 (76 y.o. 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/02/2022 No Yes L97.812 Non-pressure chronic ulcer of other part of right lower leg with fat layer 09/20/2021 No Yes exposed L89.320 Pressure ulcer of left buttock, unstageable 09/20/2021 No Yes I48.0 Paroxysmal atrial fibrillation 09/20/2021 No Yes I10 Essential (primary) hypertension 09/20/2021 No Yes Z79.01 Long term (current) use of anticoagulants 09/20/2021 No Yes Z95.0 Presence of cardiac pacemaker 09/20/2021 No Yes Z95.4 Presence of other heart-valve replacement 09/20/2021 No Yes Inactive Problems Resolved Problems Electronic Signature(s) Signed: 09/20/2021 9:32:55 AM By: Worthy Keeler PA-C Entered By: Worthy Keeler on 09/20/2021 09:32:54 -------------------------------------------------------------------------------- Progress Note Details Patient Name: Date of Service: Tonya Glass, Tonya Glass 09/20/2021 8:30 A M Medical Record Number: [id number] Patient Account Number: [id number] Date of Birth/Sex: Treating RN: [birth year] (76 y.o. Tonya Glass, [name] Provider: [name] Other Clinician: Referring Provider: Treating Provider/Extender: Darlen Round in Treatment: 0 Subjective Chief Complaint Information obtained from Patient Bilateral LE Ulcers and left gluteal ulcer History of Present Illness (HPI) 09-22-2021 upon evaluation today patient presents with multiple lower extremity ulcerations of the bilateral lower extremities which have been present for a little over a month in general for most of these although some are new or. Most began around mid April time. Nonetheless the patient does have lymphedema which I think is contributing to the issues here. Fortunately  there does not appear to be any evidence of infection at this time which is great news. The patient does have wounds over again both lower extremities. There is a history of lymphedema, peripheral vascular disease, atrial fibrillation, hypertension, long-term use of anticoagulant therapy, a cardiac pacemaker, and the patient has had a heart valve replacement. Patient History Information obtained from Patient, Caregiver, Chart. Allergies No Known Allergies Family History Unknown History. Social History Former smoker - quit 25 years, Marital Status - Widowed, Alcohol Use - Never, Drug Use - No History, Caffeine Use - Never. Medical History Respiratory Patient has history of Chronic Obstructive Pulmonary Disease (COPD) Cardiovascular Patient has history of Congestive Heart Failure, Hypertension Denies history of Angina, Arrhythmia, Coronary Artery Disease, Deep Vein Thrombosis, Hypotension, Myocardial Infarction, Peripheral Arterial Disease, Peripheral Venous Disease, Phlebitis, Vasculitis Endocrine Patient has history of Type II Diabetes Musculoskeletal Patient has history of Rheumatoid Arthritis, Osteoarthritis Patient is treated with Oral Agents. Blood sugar is not tested. Hospitalization/Surgery History - right/left hgeart cath and coronary angiography 08/07/2021. - aortic arch angiography 08/07/2021. - TEE without cardioversion 06/13/2021. - cataract extractionss. - cardiac valve replacement. - pacemakers. - gastric sleeve resection in [place]. - Breast surgery right. - Removed gladder. Medical A Surgical History Notes nd Cardiovascular aortic valve stenosis, CHF, heart murmur, hypercholesterolemia, pacemaker Endocrine A1C 6.4 Neurologic hx of stroke Review of Systems (ROS) Constitutional Symptoms (General Health) Denies complaints or symptoms of Fatigue, Fever, Chills, Marked Weight Change. Eyes Denies complaints or symptoms of Dry Eyes, Vision Changes, Glasses /  Contacts. Ear/Nose/Mouth/Throat Denies complaints or symptoms of Chronic sinus problems or rhinitis. Respiratory Denies complaints or symptoms of Chronic or frequent coughs, Shortness of Breath. Gastrointestinal Denies complaints or symptoms of Frequent diarrhea, Nausea, Vomiting. Genitourinary Denies complaints or symptoms of Frequent urination. Integumentary (Skin) Complains or has symptoms of Wounds - BL legs, buttock. Musculoskeletal Denies complaints or symptoms of Muscle Pain, Muscle Weakness. Psychiatric Denies complaints or symptoms of Claustrophobia, Suicidal. Objective Constitutional sitting or standing blood pressure is within target range for patient.. pulse regular and within  target range for patient.Marland Kitchen respirations regular, non-labored and within target range for patient.Marland Kitchen temperature within target range for patient.. Chronically ill appearing but in no apparent acute distress. Vitals Time Taken: 8:38 AM, Height: 60 in, Source: Stated, Weight: 202 lbs, Source: Stated, BMI: 39.4, Temperature: 97.7 F, Pulse: 61 bpm, Respiratory Rate: 16 breaths/min, Blood Pressure: 101/43 mmHg. Eyes conjunctiva clear no eyelid edema noted. pupils equal round and reactive to light and accommodation. Ears, Nose, Mouth, and Throat no gross abnormality of ear auricles or external auditory canals. normal hearing noted during conversation. mucus membranes moist. Respiratory normal breathing without difficulty. Cardiovascular 2+ dorsalis pedis/posterior tibialis pulses. no clubbing, cyanosis, significant edema, Musculoskeletal normal gait and posture. no significant deformity or arthritic changes, no loss or range of motion, no clubbing. Psychiatric this patient is able to make decisions and demonstrates good insight into disease process. Alert and Oriented x 3. pleasant and cooperative. General Notes: Upon inspection patient's wound bed actually showed signs of most locations of doing well.  She was having quite a bit of discomfort unfortunately and I think that is the biggest issue that I see right now. No sharp debridement was performed due to the fact that the patient is having again quite a bit of discomfort. Integumentary (Hair, Skin) Wound #1 status is Open. Original cause of wound was Blister. The date acquired was: 08/21/2021. The wound is located on the Left,Distal,Medial Lower Leg. The wound measures 2.2cm length x 3.5cm width x 0.1cm depth; 6.048cm^2 area and 0.605cm^3 volume. There is Fat Layer (Subcutaneous Tissue) exposed. There is no tunneling or undermining noted. There is a medium amount of serosanguineous drainage noted. The wound margin is distinct with the outline attached to the wound base. There is large (67-100%) pink, pale granulation within the wound bed. There is a small (1-33%) amount of necrotic tissue within the wound bed including Eschar. Wound #2 status is Open. Original cause of wound was Blister. The date acquired was: 08/21/2021. The wound is located on the Left,Proximal,Medial Lower Leg. The wound measures 3cm length x 3cm width x 0.1cm depth; 7.069cm^2 area and 0.707cm^3 volume. There is Fat Layer (Subcutaneous Tissue) exposed. There is no tunneling or undermining noted. There is a medium amount of serosanguineous drainage noted. The wound margin is distinct with the outline attached to the wound base. There is large (67-100%) pink granulation within the wound bed. There is no necrotic tissue within the wound bed. Wound #3 status is Open. Original cause of wound was Blister. The date acquired was: 08/21/2021. The wound is located on the Right,Proximal,Medial Lower Leg. The wound measures 1cm length x 1.5cm width x 0.1cm depth; 1.178cm^2 area and 0.118cm^3 volume. There is Fat Layer (Subcutaneous Tissue) exposed. There is no tunneling or undermining noted. There is a medium amount of serosanguineous drainage noted. The wound margin is distinct with  the outline attached to the wound base. There is no granulation within the wound bed. There is a large (67-100%) amount of necrotic tissue within the wound bed including Eschar. Wound #4 status is Open. Original cause of wound was Gradually Appeared. The date acquired was: 08/21/2021. The wound is located on the Right,Distal,Medial Lower Leg. The wound measures 3.5cm length x 1cm width x 0.1cm depth; 2.749cm^2 area and 0.275cm^3 volume. There is no tunneling or undermining noted. There is a medium amount of serosanguineous drainage noted. The wound margin is distinct with the outline attached to the wound base. There is no granulation within the wound bed. There is a  large (67-100%) amount of necrotic tissue within the wound bed including Eschar. Wound #5 status is Open. Original cause of wound was Blister. The date acquired was: 09/15/2021. The wound is located on the Right,Lateral Lower Leg. The wound measures 2cm length x 2cm width x 0.1cm depth; 3.142cm^2 area and 0.314cm^3 volume. There is Fat Layer (Subcutaneous Tissue) exposed. There is no tunneling or undermining noted. There is a medium amount of serosanguineous drainage noted. The wound margin is distinct with the outline attached to the wound base. There is large (67-100%) pink, pale granulation within the wound bed. There is no necrotic tissue within the wound bed. Wound #6 status is Open. Original cause of wound was Pressure Injury. The date acquired was: 08/29/2021. The wound is located on the Left Gluteus. The wound measures 1.6cm length x 0.6cm width x 0.2cm depth; 0.754cm^2 area and 0.151cm^3 volume. There is no tunneling or undermining noted. There is a medium amount of serosanguineous drainage noted. The wound margin is distinct with the outline attached to the wound base. There is no granulation within the wound bed. There is a large (67-100%) amount of necrotic tissue within the wound bed including Adherent Slough. 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 There was a Chemical/Enzymatic/Mechanical debridement performed by Worthy Keeler, PA.. Other agent used was gauze and wound cleanser. There was no bleeding. The procedure was tolerated well. Post Debridement Measurements: 2.2cm length x 2.5cm width x 0.1cm depth; 0.432cm^3 volume. Character of Wound/Ulcer Post Debridement is stable. Severity of Tissue Post Debridement is: Fat layer exposed. Post procedure Diagnosis Wound #1: Same as Pre-Procedure Wound #2 Pre-procedure diagnosis of Wound #2 is a Lymphedema located on the Left,Proximal,Medial Lower Leg .Severity of Tissue Pre Debridement is: Limited to breakdown of skin. There was a Chemical/Enzymatic/Mechanical debridement performed by Deon Pilling, RN.. Other agent used was gauze and wound cleanser. There was no bleeding. The procedure was tolerated well. Post Debridement Measurements: 3cm length x 3cm width x 0.1cm depth; 0.707cm^3 volume. Character of Wound/Ulcer Post Debridement requires further debridement. Severity of Tissue Post Debridement is: Limited to breakdown of skin. Post procedure Diagnosis Wound #2: Same as Pre-Procedure Wound #3 Pre-procedure diagnosis of Wound #3 is a Lymphedema located on the Right,Proximal,Medial Lower Leg .Severity of Tissue Pre Debridement is: Limited  to breakdown of skin. There was a Chemical/Enzymatic/Mechanical debridement performed by Deon Pilling, RN.. Other agent used was gauze and wound cleanser. There was no bleeding. The procedure was tolerated well. Post Debridement Measurements: 1cm length x 1.5cm width x 0.1cm depth; 0.118cm^3 volume. Character of Wound/Ulcer Post Debridement is stable. Severity of Tissue Post Debridement is: Limited to breakdown of skin. Post procedure Diagnosis Wound #3: Same as Pre-Procedure Wound #4 Pre-procedure diagnosis of Wound #4 is a Lymphedema located on the Right,Distal,Medial Lower Leg .Severity of Tissue Pre Debridement is: Limited to breakdown of skin. There was a Chemical/Enzymatic/Mechanical debridement performed by Deon Pilling, RN.. Other agent used was gauze and wound cleanser. There was no bleeding.  The procedure was tolerated well. Post Debridement Measurements: 3.5cm length x 1cm width x 0.1cm depth; 0.275cm^3 volume. Character of Wound/Ulcer Post Debridement is stable. Severity of Tissue Post Debridement is: Limited to breakdown of skin. Post procedure Diagnosis Wound #4: Same as Pre-Procedure Wound #5 Pre-procedure diagnosis of Wound #5 is a Lymphedema located on the Right,Lateral Lower Leg .Severity of Tissue Pre Debridement is: Limited to breakdown of skin. There was a Chemical/Enzymatic/Mechanical debridement performed by Worthy Keeler, PA.. Other agent used was gauze and wound cleanser. There was no bleeding. The procedure was tolerated well. Post Debridement Measurements: 2cm length x 2cm width x 0.1cm depth; 0.314cm^3 volume. Character of Wound/Ulcer Post Debridement is stable. Severity of Tissue Post Debridement is: Limited to breakdown of skin. Post procedure Diagnosis Wound #5: Same as Pre-Procedure Wound #6 Pre-procedure diagnosis of Wound #6 is a Pressure Ulcer located on the Left Gluteus . There was a Chemical/Enzymatic/Mechanical debridement performed by Deon Pilling,  RN.. Other agent used was gauze and wound cleanser. There was no bleeding. The procedure was tolerated well. Post Debridement Measurements: 1.6cm length x 0.6cm width x 0.2cm depth; 0.151cm^3 volume. Post debridement Stage noted as Unstageable/Unclassified. Character of Wound/Ulcer Post Debridement is stable. Post procedure Diagnosis Wound #6: Same as Pre-Procedure Plan Follow-up Appointments: Return Appointment in 1 week. Jeri Cos, PA and [place] room 9 Wednesday 09/27/2021 315pm Other: - [facility] will call you to perform arterial studies for both legs. They will call you to schedule. Bathing/ Shower/ Hygiene: May shower and wash wound with soap and water. - with dressings. Edema Control - Lymphedema / SCD / Other: Elevate legs to the level of the heart or above for 30 minutes daily and/or when sitting, a frequency of: - 3-4 times a day throughout the day. Avoid standing for long periods of time. Off-Loading: Turn and reposition every 2 hours Other: - ensure to get up to stand and walk to limit pressure to buttock wound. Services and Therapies ordered were: Arterial Studies- Bilateral - [facility] office will call you to schedule your appt time for the arterial studies. WOUND #1: - Lower Leg Wound Laterality: Left, Medial, Distal Cleanser: Soap and Water 3 x Per Week/30 Days Discharge Instructions: May shower and wash wound with dial antibacterial soap and water prior to dressing change. Prim Dressing: KerraCel Ag Gelling Fiber Dressing, 2x2 in (silver alginate) (DME) (Generic) 3 x Per Week/30 Days ary Discharge Instructions: Apply silver alginate to wound bed as instructed Secondary Dressing: ABD Pad, 5x9 (DME) (Generic) 3 x Per Week/30 Days Discharge Instructions: Apply over primary dressing as directed. Secondary Dressing: Woven Gauze Sponge, Non-Sterile 4x4 in (DME) (Generic) 3 x Per Week/30 Days Discharge Instructions: Apply over primary dressing as  directed. Secured With: Elastic Bandage 4 inch (ACE bandage) (DME) (Generic) 3 x Per Week/30 Days Discharge Instructions: Secure with ACE bandage as directed. Secured With: [employer], 4.5x3.1 (in/yd) (DME) (Generic) 3 x Per Week/30 Days Discharge Instructions: Secure with Kerlix as directed. Secured With: 34M Medipore H Soft Cloth Surgical T ape, 4 x 10 (in/yd) (DME) (Generic) 3 x Per Week/30 Days Discharge Instructions: Secure with tape as directed. WOUND #2: - Lower Leg Wound Laterality: Left, Medial, Proximal Cleanser: Soap and Water 3 x Per Week/30 Days Discharge Instructions: May shower and wash wound with dial antibacterial soap and water prior to dressing change. Prim Dressing: KerraCel Ag Gelling Fiber Dressing, 2x2 in (silver alginate) (DME) (Generic) 3 x Per Week/30 Days ary Discharge  Instructions: Apply silver alginate to wound bed as instructed Secondary Dressing: ABD Pad, 5x9 (DME) (Generic) 3 x Per Week/30 Days Discharge Instructions: Apply over primary dressing as directed. Secondary Dressing: Woven Gauze Sponge, Non-Sterile 4x4 in (DME) (Generic) 3 x Per Week/30 Days Discharge Instructions: Apply over primary dressing as directed. Secured With: Elastic Bandage 4 inch (ACE bandage) (DME) (Generic) 3 x Per Week/30 Days Discharge Instructions: Secure with ACE bandage as directed. Secured With: [employer], 4.5x3.1 (in/yd) (DME) (Generic) 3 x Per Week/30 Days Discharge Instructions: Secure with Kerlix as directed. Secured With: 26M Medipore H Soft Cloth Surgical T ape, 4 x 10 (in/yd) (DME) (Generic) 3 x Per Week/30 Days Discharge Instructions: Secure with tape as directed. WOUND #3: - Lower Leg Wound Laterality: Right, Medial, Proximal Cleanser: Soap and Water 3 x Per Week/30 Days Discharge Instructions: May shower and wash wound with dial antibacterial soap and water prior to dressing change. Prim Dressing: betadine 3 x Per Week/30 Days ary Discharge  Instructions: purchase over the counter and apply directly to wound. Secondary Dressing: ABD Pad, 5x9 (DME) (Generic) 3 x Per Week/30 Days Discharge Instructions: Apply over primary dressing as directed. Secured With: Elastic Bandage 4 inch (ACE bandage) (DME) (Generic) 3 x Per Week/30 Days Discharge Instructions: Secure with ACE bandage as directed. Secured With: [employer], 4.5x3.1 (in/yd) (DME) (Generic) 3 x Per Week/30 Days Discharge Instructions: Secure with Kerlix as directed. Secured With: 26M Medipore H Soft Cloth Surgical T ape, 4 x 10 (in/yd) (DME) (Generic) 3 x Per Week/30 Days Discharge Instructions: Secure with tape as directed. WOUND #4: - Lower Leg Wound Laterality: Right, Medial, Distal Cleanser: Soap and Water 3 x Per Week/30 Days Discharge Instructions: May shower and wash wound with dial antibacterial soap and water prior to dressing change. Prim Dressing: betadine 3 x Per Week/30 Days ary Discharge Instructions: purchase over the counter and apply directly to wound. Secondary Dressing: ABD Pad, 5x9 (DME) (Generic) 3 x Per Week/30 Days Discharge Instructions: Apply over primary dressing as directed. Secured With: Elastic Bandage 4 inch (ACE bandage) (DME) (Generic) 3 x Per Week/30 Days Discharge Instructions: Secure with ACE bandage as directed. Secured With: [employer], 4.5x3.1 (in/yd) (DME) (Generic) 3 x Per Week/30 Days Discharge Instructions: Secure with Kerlix as directed. Secured With: 26M Medipore H Soft Cloth Surgical T ape, 4 x 10 (in/yd) (DME) (Generic) 3 x Per Week/30 Days Discharge Instructions: Secure with tape as directed. WOUND #5: - Lower Leg Wound Laterality: Right, Lateral Cleanser: Soap and Water 3 x Per Week/30 Days Discharge Instructions: May shower and wash wound with dial antibacterial soap and water prior to dressing change. Prim Dressing: KerraCel Ag Gelling Fiber Dressing, 2x2 in (silver alginate) (DME) (Generic) 3 x Per Week/30  Days ary Discharge Instructions: Apply silver alginate to wound bed as instructed Secondary Dressing: ABD Pad, 5x9 (DME) (Generic) 3 x Per Week/30 Days Discharge Instructions: Apply over primary dressing as directed. Secondary Dressing: Woven Gauze Sponge, Non-Sterile 4x4 in (DME) (Generic) 3 x Per Week/30 Days Discharge Instructions: Apply over primary dressing as directed. Secured With: Elastic Bandage 4 inch (ACE bandage) (DME) (Generic) 3 x Per Week/30 Days Discharge Instructions: Secure with ACE bandage as directed. Secured With: [employer], 4.5x3.1 (in/yd) (DME) (Generic) 3 x Per Week/30 Days Discharge Instructions: Secure with Kerlix as directed. Secured With: 26M Medipore H Soft Cloth Surgical T ape, 4 x 10 (in/yd) (DME) (Generic) 3 x Per Week/30 Days Discharge Instructions: Secure with tape as  directed. WOUND #6: - Gluteus Wound Laterality: Left Cleanser: Soap and Water Every Other Day/30 Days Discharge Instructions: May shower and wash wound with dial antibacterial soap and water prior to dressing change. Peri-Wound Care: Skin Prep (DME) (Generic) Every Other Day/30 Days Discharge Instructions: Use skin prep as directed Prim Dressing: KerraCel Ag Gelling Fiber Dressing, 2x2 in (silver alginate) (DME) (Generic) Every Other Day/30 Days ary Discharge Instructions: Apply silver alginate to wound bed as instructed Secondary Dressing: Zetuvit Plus Silicone Border Dressing 4x4 (in/in) (DME) (Generic) Every Other Day/30 Days Discharge Instructions: Apply silicone border over primary dressing as directed. 1. I do believe that she would benefit from a silver alginate dressing to wounds over the left gluteal region, right lateral lower leg, left medial/proximal lower leg, and left medial/distal lower leg. Were also going to continue with using Betadine and ABD pad over the right medial lower leg distally as well as the right medial/proximal lower leg. 2. I am good recommend that this  should be secured with roll gauze as well as an ABD pad to cover at all locations. 3. I am also going to be sending the patient to vein and vascular for arterial studies bilateral with ABIs and TBI's. If everything comes back normal here that I would like to get her started with compression wrapping that is my main concern right now and one of the biggest reasons why did not want to do any aggressive sharp debridement. We will see patient back for reevaluation in 1 week here in the clinic. If anything worsens or changes patient will contact our office for additional recommendations. Electronic Signature(s) Signed: 09/22/2021 6:14:28 PM By: Worthy Keeler PA-C Entered By: Worthy Keeler on 09/22/2021 18:14:28 -------------------------------------------------------------------------------- HxROS Details Patient Name: Date of Service: Tonya Glass, Tonya Glass 09/20/2021 8:30 A M Medical Record Number: [id number] Patient Account Number: [id number] Date of Birth/Sex: Treating RN: 1945-09-28 (76 y.o. Tonya Glass, [name] Provider: [name] Other Clinician: Referring Provider: Treating Provider/Extender: Darlen Round in Treatment: 0 Information Obtained From Patient Caregiver Chart Constitutional Symptoms (General Health) Complaints and Symptoms: Negative for: Fatigue; Fever; Chills; Marked Weight Change Eyes Complaints and Symptoms: Negative for: Dry Eyes; Vision Changes; Glasses / Contacts Ear/Nose/Mouth/Throat Complaints and Symptoms: Negative for: Chronic sinus problems or rhinitis Respiratory Complaints and Symptoms: Negative for: Chronic or frequent coughs; Shortness of Breath Medical History: Positive for: Chronic Obstructive Pulmonary Disease (COPD) Gastrointestinal Complaints and Symptoms: Negative for: Frequent diarrhea; Nausea; Vomiting Genitourinary Complaints and Symptoms: Negative for: Frequent urination Integumentary (Skin) Complaints  and Symptoms: Positive for: Wounds - BL legs, buttock Musculoskeletal Complaints and Symptoms: Negative for: Muscle Pain; Muscle Weakness Medical History: Positive for: Rheumatoid Arthritis; Osteoarthritis Psychiatric Complaints and Symptoms: Negative for: Claustrophobia; Suicidal Hematologic/Lymphatic Cardiovascular Medical History: Positive for: Congestive Heart Failure; Hypertension Negative for: Angina; Arrhythmia; Coronary Artery Disease; Deep Vein Thrombosis; Hypotension; Myocardial Infarction; Peripheral Arterial Disease; Peripheral Venous Disease; Phlebitis; Vasculitis Past Medical History Notes: aortic valve stenosis, CHF, heart murmur, hypercholesterolemia, pacemaker Endocrine Medical History: Positive for: Type II Diabetes Past Medical History Notes: A1C 6.4 Time with diabetes: 16 years Treated with: Oral agents Blood sugar tested every day: No Immunological Neurologic Medical History: Past Medical History Notes: hx of stroke Oncologic Immunizations Pneumococcal Vaccine: Received Pneumococcal Vaccination: Yes Received Pneumococcal Vaccination On or After 60th Birthday: Yes Implantable Devices None Hospitalization / Surgery History Type of Hospitalization/Surgery right/left hgeart cath and coronary angiography 08/07/2021 aortic arch angiography 08/07/2021 TEE without cardioversion 06/13/2021 cataract extractionss  cardiac valve replacement pacemakers gastric sleeve resection in [place] Breast surgery right Removed gladder Family and Social History Unknown History: Yes; Former smoker - quit 25 years; Marital Status - Widowed; Alcohol Use: Never; Drug Use: No History; Caffeine Use: Never; Financial Concerns: No; Food, Clothing or Shelter Needs: No; Support System Lacking: No; Transportation Concerns: No Electronic Signature(s) Signed: 09/20/2021 5:07:52 PM By: Rhae Hammock RN Signed: 09/20/2021 6:03:33 PM By: Worthy Keeler PA-C Signed: 10/24/2021  8:46:54 AM By: Erenest Blank Entered By: Erenest Blank on 09/20/2021 08:47:45 -------------------------------------------------------------------------------- SuperBill Details Patient Name: Date of Service: Tonya Glass, Tonya Glass 09/20/2021 Medical Record Number: [id number] Patient Account Number: [id number] Date of Birth/Sex: Treating RN: [birth year] (76 y.o. Tonya Glass, [name] Primary Care Provider: [name] Other Clinician: Referring Provider: Treating Provider/Extender: Darlen Round in Treatment: 0 Diagnosis Coding ICD-10 Codes Code Description I89.0 Lymphedema, not elsewhere classified I73.89 Other specified peripheral vascular diseases L97.822 Non-pressure chronic ulcer of other part of left lower leg with fat layer exposed L97.812 Non-pressure chronic ulcer of other part of right lower leg with fat layer exposed L89.320 Pressure ulcer of left buttock, unstageable I48.0 Paroxysmal atrial fibrillation I10 Essential (primary) hypertension Z79.01 Long term (current) use of anticoagulants 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 Facility Procedures CPT4 Code: [id number] Description: 99213 - WOUND CARE VISIT-LEV 3 EST PT Modifier: Quantity: 1 Physician Procedures : CPT4 Code Description Modifier [id number] - WC PHYS LEVEL 4 - NEW PT ICD-10 Diagnosis Description I89.0 Lymphedema, not elsewhere classified I73.89 Other specified peripheral vascular diseases L97.822 Non-pressure chronic ulcer of other part of left  lower leg with fat layer exposed L97.812 Non-pressure chronic ulcer of other part of right lower leg with fat layer exposed Quantity: 1 Electronic Signature(s) Signed: 09/20/2021 6:01:41 PM By: Worthy Keeler PA-C Previous Signature: 09/20/2021 6:01:31 PM Version By: Worthy Keeler PA-C Entered By: Worthy Keeler on 09/20/2021 18:01:40

## 2021-10-25 NOTE — Progress Notes (Signed)
Remote pacemaker transmission.   

## 2021-11-08 ENCOUNTER — Encounter: Payer: Self-pay | Admitting: Internal Medicine

## 2021-11-08 ENCOUNTER — Telehealth: Payer: Self-pay | Admitting: Internal Medicine

## 2021-11-08 ENCOUNTER — Telehealth (INDEPENDENT_AMBULATORY_CARE_PROVIDER_SITE_OTHER): Payer: Medicare Other | Admitting: Internal Medicine

## 2021-11-08 VITALS — Wt 205.0 lb

## 2021-11-08 DIAGNOSIS — Z950 Presence of cardiac pacemaker: Secondary | ICD-10-CM

## 2021-11-08 DIAGNOSIS — Z9889 Other specified postprocedural states: Secondary | ICD-10-CM | POA: Diagnosis not present

## 2021-11-08 DIAGNOSIS — I739 Peripheral vascular disease, unspecified: Secondary | ICD-10-CM

## 2021-11-08 DIAGNOSIS — Z953 Presence of xenogenic heart valve: Secondary | ICD-10-CM | POA: Diagnosis not present

## 2021-11-08 DIAGNOSIS — I5032 Chronic diastolic (congestive) heart failure: Secondary | ICD-10-CM | POA: Diagnosis not present

## 2021-11-08 NOTE — Progress Notes (Signed)
Virtual Visit via Video Note   This visit type was conducted due to national recommendations for restrictions regarding the COVID-19 Pandemic (e.g. social distancing) in an effort to limit this patient's exposure and mitigate transmission in our community.  Due to her co-morbid illnesses, this patient is at least at moderate risk for complications without adequate follow up.  This format is felt to be most appropriate for this patient at this time.  The patient does have access to video technology.  All issues noted in this document were discussed and addressed.  A limited physical exam could be performed with this format.  Please refer to the patient's chart for her  consent to telehealth for [facility].   Evaluation Performed: Video follow-up  Date:  11/08/2021   ID:  Tonya Glass, DOB [birth year], MRN [id number]  Patient Location:  [facility] [address] [place] [address]  Provider location:   [address], [place], [place] [address]  PCP:  Roetta Sessions, NP  Cardiologist:  Pixie Casino, MD Electrophysiologist:  None   Chief Complaint: No complaints  History of Present Illness:    Tonya Glass is a 76 y.o. female who presents via audio/video conferencing for a telehealth visit today.  Tonya Glass is a pleasant 76 year old female that I am following with a history of aortic valve replacement, sick sinus syndrome status post pacemaker, diastolic congestive heart failure, COPD, difficult to control hypertension, dyslipidemia and type 2 diabetes.  She frequently visits friends in [place] and was recently in [place] just prior to the outbreak of coronavirus.  Fortunately she was not affected and her family gave her masks to bring back with her.  Since then she is essentially isolated at home.  She denies any worsening shortness of breath or chest pain.  She had a remote pacer check in February which was functioning normally and has an  upcoming remote pacer check soon.  She reports her PCP just did some blood work on her which was faxed to our office however I do not have that immediately available to review.  10/13/2020  Tonya Glass returns today for follow-up.  Overall she says she is feeling very well.  She just had a recent remote pacer check yesterday.  She was noted to have some short bursts of nonsustained VT up to 14 beats as well as some atrial flutter lasting over 3 hours at 1 point which spontaneously converted.  She had another episode in January with spontaneous conversion.  She says she is not really that symptomatic with palpitations.  She denies any chest pain or worsening shortness of breath.  Echo last year showed stable gradients across her bioprosthetic aortic valve without dilatation of her ascending aortic graft.  Her LVEF was normal.  In March of this year she was noted to have some increasing shortness of breath.  BNP was mildly elevated in the 300s and felt to be due to some diastolic congestive failure.  She also has some moderate chronic kidney disease.  Her Lasix was increased to 40 mg daily and she reports some improvement in her edema.  She still has some dependent edema.  It was reported by CT in 2019 that she had some dilatation of the descending thoracic aorta which was calcified.  This has not yet been reevaluated.  I recommended a repeat CT angiogram with the thoracic aorta to evaluate both the graft and her descending aortic aneurysm however she declined stating that  she "would not have surgery again".  11/08/2021  Tonya Glass was seen today for video follow-up.  She has had an eventful past 6 months.  Early in January she underwent an echocardiogram after being seen by Dr. Sallyanne Kuster.  She has had some progressive shortness of breath and the echo suggested significant aortic insufficiency/perivalvular leak.  Ultimately she underwent transesophageal echocardiography by Dr. Loletha Grayer and was noted to have  significant perivalvular leak.  She was then sent for CT angiography of the aorta/TAVR protocol work-up.  Ultimately, she underwent left and right heart catheterization which showed minimal nonobstructive coronary disease but 3+ perivalvular leak.  This was performed by Dr. Burt Knack who made attempts to try to access the perivalvular space to see if he could potentially close this leak but this was apparently unsuccessful.  Subsequently she has had no further attempts to try to mitigate this that I am aware of.  She did undergo lower extremity arterial Dopplers which showed a high-grade left SFA stenosis.  She was contacted by vascular surgery about this but declined an appointment in May.  She now tells me that she wishes to schedule an appointment with them.  She says she has no left leg or left buttocks claudication but is not very active.  She notes that her blood pressures actually been reasonably well controlled although did have a low blood pressure yesterday.  She is on 80 mg furosemide in the morning and 40 mg in the afternoon.  The patient does not have symptoms concerning for COVID-19 infection (fever, chills, cough, or new SHORTNESS OF BREATH).   Prior CV studies:   The following studies were reviewed today:  Chart review  PMHx:  Past Medical History:  Diagnosis Date   Anxiety    Aortic valve stenosis 10/10/2016   Arthritis    "fingers" (07/24/2017)   CHF (congestive heart failure) (HCC)    COPD (chronic obstructive pulmonary disease) (HCC)    Depression    Gallstones    Heart murmur    High cholesterol    History of kidney stones    Hypertension    Hypothyroidism    Migraines    "none in years til 01/2013-02/2013; none since"  (07/24/2017)   Orthopnoea    "just since open heart surgery" (02/25/2013)   Pacemaker    [employer]    Pneumonia ~ 2012   "walking pneumonia"   Pneumonia 07/23/2017   Stroke ([place]) 2001   denies residual on 02/25/2013   Symptomatic hypotension  10/10/2016   Syncope and collapse    "prior to open heart surgery" (02/25/2013)   Type II diabetes mellitus ([place])     Past Surgical History:  Procedure Laterality Date   AORTIC ARCH ANGIOGRAPHY N/A 08/07/2021   Procedure: AORTIC ARCH ANGIOGRAPHY;  Surgeon: Sherren Mocha, MD;  Location: [facility];  Service: Cardiovascular;  Laterality: N/A;   BREAST SURGERY Right    "clamped blood vessel and stitched it up; in [place]"   [place]  01/26/2013   "cow valve put in; widened the second valve" (02/25/2013) - [place], [place]   CATARACT EXTRACTION W/ INTRAOCULAR LENS  IMPLANT, BILATERAL Bilateral 04/2017   CHOLECYSTECTOMY OPEN  1971   CYST EXCISION     "taken off my chest"   INSERT / REPLACE / REMOVE PACEMAKER  01/19/2013   [facility] - dual chamber   LAPAROSCOPIC GASTRIC SLEEVE RESECTION  2009   in [place]   RIGHT/LEFT  HEART CATH AND CORONARY ANGIOGRAPHY N/A 08/07/2021   Procedure: RIGHT/LEFT HEART CATH AND CORONARY ANGIOGRAPHY;  Surgeon: Sherren Mocha, MD;  Location: [facility];  Service: Cardiovascular;  Laterality: N/A;   TEE WITHOUT CARDIOVERSION N/A 06/13/2021   Procedure: TRANSESOPHAGEAL ECHOCARDIOGRAM (TEE);  Surgeon: Sanda Klein, MD;  Location: [facility];  Service: Cardiovascular;  Laterality: N/A;   VEIN SURGERY Left    LLE; cut leg open and took out piece of vein that was protruding thru skin; stitched it up; done in [place]    FAMHx:  Family History  Problem Relation Age of Onset   Lung cancer Mother    Heart Problems Father    Hypertension Brother    Colon polyps Brother    Skin cancer Brother    Asthma Brother    Colon polyps Daughter    Diabetes Maternal Aunt    Breast cancer Maternal Aunt        mets to liver   Diabetes Paternal Aunt     SOCHx:   reports that she quit smoking about 22 years ago. Her smoking use included cigarettes. She has a 80.00 pack-year smoking history. She has never used  smokeless tobacco. She reports that she does not drink alcohol and does not use drugs.  ALLERGIES:  No Known Allergies  MEDS:  Current Meds  Medication Sig   albuterol (PROVENTIL HFA;VENTOLIN HFA) 108 (90 BASE) MCG/ACT inhaler Inhale 2 puffs into the lungs every 6 (six) hours as needed for wheezing or shortness of breath. Use as directed -  With exercise   alendronate (FOSAMAX) 70 MG tablet Take 70 mg by mouth every Sunday.   amLODipine (NORVASC) 2.5 MG tablet Take 2.5 mg every night   atorvastatin (LIPITOR) 40 MG tablet Take 40 mg by mouth daily.   azelastine (ASTELIN) 0.1 % nasal spray Place 2 sprays into both nostrils 2 (two) times daily.   budesonide-formoterol (SYMBICORT) 160-4.5 MCG/ACT inhaler Inhale 2 puffs into the lungs 2 (two) times daily.   carvedilol (COREG) 6.25 MG tablet Take 1 tablet (6.25 mg total) by mouth 2 (two) times daily.   cloNIDine (CATAPRES) 0.2 MG tablet Take 1 tablet (0.2 mg total) by mouth at bedtime.   dapagliflozin propanediol (FARXIGA) 10 MG TABS tablet Take 1 tablet (10 mg total) by mouth daily before breakfast.   ELIQUIS 5 MG TABS tablet Take 1 tablet (5 mg total) by mouth 2 (two) times daily.   Ferrous Sulfate (IRON) 325 (65 Fe) MG TABS Take 325 mg by mouth daily.   FLUoxetine (PROZAC) 40 MG capsule Take 40 mg by mouth daily.    fluticasone (FLONASE) 50 MCG/ACT nasal spray Place 1 spray into both nostrils daily as needed for allergies.   furosemide (LASIX) 80 MG tablet Take '80mg'$  by mouth in the morning and '40mg'$  in the afternoon   gabapentin (NEURONTIN) 300 MG capsule Take 600 mg by mouth at bedtime.   glipiZIDE (GLUCOTROL) 10 MG tablet Take 10 mg by mouth 2 (two) times daily before a meal.   glucose blood test strip 1 each by Other route. Use as instructed   hydrocortisone 2.5 % cream Apply 1 application topically daily as needed (dry skin).    levocetirizine (XYZAL) 5 MG tablet Take 5 mg by mouth daily.   levothyroxine (SYNTHROID, LEVOTHROID) 50 MCG  tablet Take 50 mcg by mouth daily before breakfast.   montelukast (SINGULAIR) 10 MG tablet Take 10 mg by mouth at bedtime.   nystatin ointment (MYCOSTATIN) Apply 1 application  topically daily as needed (rash). 100,000- units/gram   olmesartan (BENICAR) 20 MG tablet Take 0.5 tablets (10 mg total) by mouth daily.   predniSONE (DELTASONE) 20 MG tablet Take 2 tablets (40 mg total) by mouth daily with breakfast.   temazepam (RESTORIL) 30 MG capsule Take 30 mg by mouth at bedtime.   traMADol (ULTRAM) 50 MG tablet Take 50 mg by mouth every 6 (six) hours as needed for moderate pain.     ROS: Pertinent items noted in HPI and remainder of comprehensive ROS otherwise negative.  Labs/Other Tests and Data Reviewed:    Recent Labs: 02/28/2021: ALT 18 04/18/2021: BNP 329.9 07/19/2021: BUN 49; Creatinine, Ser 1.69; Platelets 192 08/07/2021: Hemoglobin 11.9; Potassium 4.2; Sodium 143   Recent Lipid Panel No results found for: "CHOL", "TRIG", "HDL", "CHOLHDL", "LDLCALC", "LDLDIRECT"  Wt Readings from Last 3 Encounters:  11/08/21 205 lb (93 kg)  10/10/21 200 lb (90.7 kg)  09/11/21 204 lb 3.2 oz (92.6 kg)     Exam:    Vital Signs:  Wt 205 lb (93 kg)   BMI 40.04 kg/m    General appearance: alert and no distress Lungs: no visual respiratory difficulty Abdomen: obese Extremities: extremities normal, atraumatic, no cyanosis or edema Neurologic: Grossly normal  ASSESSMENT & PLAN:    Chronic diastolic congestive heart failure - stable AV gradient, LVEF 55-60% (2021) S/p [employer] pacemaker - complete heart block ? 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 Cadmus had quite an eventful past 6 months including echo which was abnormal, TEE, TAVR protocol CT and CT angiogram of the aorta as well as right and left heart catheterization for work-up of paravalvular leak.  It was thought that this might be amenable to percutaneous plug however this was unable to be accessed.  It seems that no further work-up has been performed.  She is on medical therapy.  She denies any significant shortness of breath.  Her blood pressures have improved somewhat.  She was found to have a left SFA high-grade stenosis.  She wishes to have this addressed by vascular surgery and I will refer her back there.  She had declined an appointment because she had other issues going on at the time.  She is not complaining of any claudication.  Recent remote pacemaker check is stable per Dr. Sallyanne Kuster with a short episode of NSVT.  No changes to her medicines today.  Plan follow-up in 6 months and I will reach out to Dr. Sallyanne Kuster to see if any further work-up for her perivalvular leak is necessary, perhaps considering an outside referral.  COVID-19 Education: The signs and symptoms of COVID-19 were discussed with the patient and how to seek care for testing (follow up with PCP or arrange E-visit).  The importance of social distancing was discussed today.  Patient Risk:   After full review of this patients clinical status, I feel that they are at least moderate risk at this time.  Time:   Today, I have spent 25  minutes with the patient with telehealth technology discussing bioprosthetic aortic valve, pacemaker, hypertension, hyperlipidemia, chronic diastolic heart failure.     Medication Adjustments/Labs and Tests Ordered: Current medicines are reviewed  at length with the patient today.  Concerns regarding medicines are outlined above.   Tests Ordered: No orders of the defined types were placed in this encounter.   Medication Changes: No orders of the defined types were placed in this encounter.   Disposition:  in 6 month(s)  Pixie Casino, MD, [facility], [place] Director of the Advanced Lipid Disorders &  Cardiovascular Risk Reduction Clinic Diplomate of the American Board of Clinical Lipidology Attending Cardiologist  Direct Dial: [phone]  Fax: [phone]  Website:  [email or web address]  Pixie Casino, MD  11/08/2021 9:02 AM

## 2021-11-08 NOTE — Telephone Encounter (Signed)
Received message from MD after video visit on 11/08/21: Tonya Glass - she needs follow-up with vascular surgery for Left SFA stenosis - can you refer her back?    ________________  LM for patient with this info. Referral ordered.

## 2021-11-08 NOTE — Patient Instructions (Signed)
Medication Instructions:  Your physician recommends that you continue on your current medications as directed. Please refer to the Current Medication list given to you today.  *If you need a refill on your cardiac medications before your next appointment, please call your pharmacy*   Follow-Up: At [facility], you and your health needs are our priority.  As part of our continuing mission to provide you with exceptional heart care, we have created designated Provider Care Teams.  These Care Teams include your primary Cardiologist (physician) and Advanced Practice Providers (APPs -  Physician Assistants and Nurse Practitioners) who all work together to provide you with the care you need, when you need it.  We recommend signing up for the patient portal called "MyChart".  Sign up information is provided on this After Visit Summary.  MyChart is used to connect with patients for Virtual Visits (Telemedicine).  Patients are able to view lab/test results, encounter notes, upcoming appointments, etc.  Non-urgent messages can be sent to your provider as well.   To learn more about what you can do with MyChart, go to [email or web address].    Your next appointment:   6 month(s)  The format for your next appointment:   In Person  Provider:   Kenneth C Hilty, MD {  

## 2021-11-10 ENCOUNTER — Ambulatory Visit: Payer: Medicare Other | Admitting: Family

## 2021-11-21 ENCOUNTER — Ambulatory Visit: Payer: Medicare Other | Admitting: Internal Medicine

## 2021-12-08 ENCOUNTER — Ambulatory Visit (INDEPENDENT_AMBULATORY_CARE_PROVIDER_SITE_OTHER): Payer: Medicare Other | Admitting: Internal Medicine

## 2021-12-08 DIAGNOSIS — J441 Chronic obstructive pulmonary disease with (acute) exacerbation: Secondary | ICD-10-CM | POA: Diagnosis not present

## 2021-12-08 NOTE — Patient Instructions (Signed)
Full PFT performed today. °

## 2021-12-08 NOTE — Progress Notes (Signed)
Full PFT performed today. °

## 2021-12-11 ENCOUNTER — Telehealth: Payer: Self-pay | Admitting: Cardiovascular Disease

## 2021-12-11 NOTE — Telephone Encounter (Signed)
-  Pt called stating she wanted to make sure she was taking carvedilol and farxiga correctly. -Pt advised per chart review, she should be taking farxiga 10 mg daily and carvedilol 6.25 mg BID. -Pt verbalized understanding

## 2021-12-11 NOTE — Telephone Encounter (Signed)
   Pt c/o medication issue:  1. Name of Medication:  carvedilol (COREG) 6.25 MG tablet  dapagliflozin propanediol (FARXIGA) 10 MG TABS tablet    2. How are you currently taking this medication (dosage and times per day)?   3. Are you having a reaction (difficulty breathing--STAT)? no  4. What is your medication issue? Pt is calling, she has questions about this medication.

## 2021-12-14 ENCOUNTER — Ambulatory Visit (INDEPENDENT_AMBULATORY_CARE_PROVIDER_SITE_OTHER): Payer: Medicare Other | Admitting: Internal Medicine

## 2021-12-14 ENCOUNTER — Encounter: Payer: Self-pay | Admitting: Internal Medicine

## 2021-12-14 VITALS — BP 140/70 | HR 60 | Ht 60.0 in | Wt 213.5 lb

## 2021-12-14 DIAGNOSIS — J398 Other specified diseases of upper respiratory tract: Secondary | ICD-10-CM | POA: Diagnosis not present

## 2021-12-14 DIAGNOSIS — R053 Chronic cough: Secondary | ICD-10-CM

## 2021-12-14 DIAGNOSIS — G4733 Obstructive sleep apnea (adult) (pediatric): Secondary | ICD-10-CM | POA: Diagnosis not present

## 2021-12-14 DIAGNOSIS — R911 Solitary pulmonary nodule: Secondary | ICD-10-CM

## 2021-12-14 DIAGNOSIS — F17200 Nicotine dependence, unspecified, uncomplicated: Secondary | ICD-10-CM | POA: Diagnosis not present

## 2021-12-14 DIAGNOSIS — J300 Vasomotor rhinitis: Secondary | ICD-10-CM

## 2021-12-14 DIAGNOSIS — K219 Gastro-esophageal reflux disease without esophagitis: Secondary | ICD-10-CM

## 2021-12-14 LAB — PULMONARY FUNCTION TEST
DL/VA % pred: 134 %
DL/VA: 5.7 ml/min/mmHg/L
DLCO cor % pred: 73 %
DLCO cor: 12.16 ml/min/mmHg
DLCO unc % pred: 73 %
DLCO unc: 12.16 ml/min/mmHg
FEF 25-75 Post: 0.46 L/sec
FEF 25-75 Pre: 0.52 L/sec
FEF2575-%Change-Post: -12 %
FEF2575-%Pred-Post: 31 %
FEF2575-%Pred-Pre: 36 %
FEV1-%Change-Post: -2 %
FEV1-%Pred-Post: 40 %
FEV1-%Pred-Pre: 41 %
FEV1-Post: 0.7 L
FEV1-Pre: 0.72 L
FEV1FVC-%Change-Post: 7 %
FEV1FVC-%Pred-Pre: 100 %
FEV6-%Change-Post: -8 %
FEV6-%Pred-Post: 39 %
FEV6-%Pred-Pre: 43 %
FEV6-Post: 0.87 L
FEV6-Pre: 0.95 L
FEV6FVC-%Pred-Post: 105 %
FEV6FVC-%Pred-Pre: 105 %
FVC-%Change-Post: -8 %
FVC-%Pred-Post: 37 %
FVC-%Pred-Pre: 40 %
FVC-Post: 0.87 L
FVC-Pre: 0.95 L
Post FEV1/FVC ratio: 80 %
Post FEV6/FVC ratio: 100 %
Pre FEV1/FVC ratio: 75 %
Pre FEV6/FVC Ratio: 100 %
RV % pred: 214 %
RV: 4.47 L
TLC % pred: 129 %
TLC: 5.77 L

## 2021-12-14 MED ORDER — IPRATROPIUM BROMIDE 0.06 % NA SOLN: 2.0000 | Freq: Three times a day (TID) | NASAL | 12 refills | Status: DC

## 2021-12-14 MED ORDER — OMEPRAZOLE 40 MG PO CPDR: 40.0000 mg | DELAYED_RELEASE_CAPSULE | Freq: Every day | ORAL | 5 refills | Status: DC

## 2021-12-14 NOTE — Progress Notes (Signed)
Office Note     CC:  Abnormal ABIs Requesting Provider:  Sonia Side., FNP  HPI: Tonya Glass is a 76 y.o. (1945/10/19) female presenting at the request of .Roetta Sessions, NP abnormal ABIs.  On exam today, Tonya Glass was doing well.  A native of [place], she spent several years living in [place] with her husband prior to moving [place] for healthcare reasons, namely congestive heart failure.  Tonya Glass has appreciated bilateral lower extremity swelling for a number of years.  She denies symptoms of claudication, ischemic rest pain, tissue loss.  She does note some heaviness by the end of the day, however this is mild.  Past Medical History:  Diagnosis Date   Anxiety    Aortic valve stenosis 10/10/2016   Arthritis    "fingers" (07/24/2017)   CHF (congestive heart failure) (HCC)    COPD (chronic obstructive pulmonary disease) (HCC)    Depression    Gallstones    Heart murmur    High cholesterol    History of kidney stones    Hypertension    Hypothyroidism    Migraines    "none in years til 01/2013-02/2013; none since"  (07/24/2017)   Orthopnoea    "just since open heart surgery" (02/25/2013)   Pacemaker    [employer]    Pneumonia ~ 2012   "walking pneumonia"   Pneumonia 07/23/2017   Stroke ([place]) 2001   denies residual on 02/25/2013   Symptomatic hypotension 10/10/2016   Syncope and collapse    "prior to open heart surgery" (02/25/2013)   Type II diabetes mellitus ([place])     Past Surgical History:  Procedure Laterality Date   AORTIC ARCH ANGIOGRAPHY N/A 08/07/2021   Procedure: AORTIC ARCH ANGIOGRAPHY;  Surgeon: Sherren Mocha, MD;  Location: [facility];  Service: Cardiovascular;  Laterality: N/A;   BREAST SURGERY Right    "clamped blood vessel and stitched it up; in [place]"   [place]  01/26/2013   "cow valve put in; widened the second valve" (02/25/2013) - [place], [place]   CATARACT  EXTRACTION W/ INTRAOCULAR LENS  IMPLANT, BILATERAL Bilateral 04/2017   CHOLECYSTECTOMY OPEN  1971   CYST EXCISION     "taken off my chest"   INSERT / REPLACE / REMOVE PACEMAKER  01/19/2013   [place] chamber   LAPAROSCOPIC GASTRIC SLEEVE RESECTION  2009   in [place]   RIGHT/LEFT [place] N/A 08/07/2021   Procedure: RIGHT/LEFT HEART CATH AND CORONARY ANGIOGRAPHY;  Surgeon: Sherren Mocha, MD;  Location: [facility];  Service: Cardiovascular;  Laterality: N/A;   TEE WITHOUT CARDIOVERSION N/A 06/13/2021   Procedure: TRANSESOPHAGEAL ECHOCARDIOGRAM (TEE);  Surgeon: Sanda Klein, MD;  [facility]: [facility];  Service: Cardiovascular;  Laterality: N/A;   VEIN SURGERY Left    LLE; cut leg open and took out piece of vein that was protruding thru skin; stitched it up; done in [place]    Social History   Socioeconomic History   Marital status: Widowed    Spouse name: Not on file   Number of children: 2   Years of education: dental sch   Highest education level: Not on file  Occupational History   Occupation: retired  Tobacco Use   Smoking status: Former    Packs/day: 2.00    Years: 40.00    Total pack years: 80.00    Types:  Cigarettes    Quit date: 05/29/1999    Years since quitting: 22.5   Smokeless tobacco: Never  Vaping Use   Vaping Use: Never used  Substance and Sexual Activity   Alcohol use: No   Drug use: No   Sexual activity: Yes  Other Topics Concern   Not on file  Social History Narrative   Not on file   Social Determinants of Health   Financial Resource Strain: Not on file  Food Insecurity: Not on file  Transportation Needs: Not on file  Physical Activity: Not on file  Stress: Not on file  Social Connections: Not on file  Intimate Partner Violence: Not on file   Family History  Problem Relation Age of Onset   Lung cancer Mother    Heart Problems Father    Hypertension Brother    Colon polyps Brother    Skin  cancer Brother    Asthma Brother    Colon polyps Daughter    Diabetes Maternal Aunt    Breast cancer Maternal Aunt        mets to liver   Diabetes Paternal Aunt     Current Outpatient Medications  Medication Sig Dispense Refill   albuterol (PROVENTIL HFA;VENTOLIN HFA) 108 (90 BASE) MCG/ACT inhaler Inhale 2 puffs into the lungs every 6 (six) hours as needed for wheezing or shortness of breath. Use as directed -  With exercise     albuterol (PROVENTIL) (2.5 MG/3ML) 0.083% nebulizer solution Take 2.5 mg by nebulization 3 (three) times daily as needed for wheezing or shortness of breath.     alendronate (FOSAMAX) 70 MG tablet Take 70 mg by mouth every Sunday.     amLODipine (NORVASC) 2.5 MG tablet Take 2.5 mg every night 90 tablet 3   atorvastatin (LIPITOR) 40 MG tablet Take 40 mg by mouth daily.     budesonide-formoterol (SYMBICORT) 160-4.5 MCG/ACT inhaler Inhale 2 puffs into the lungs 2 (two) times daily.     carvedilol (COREG) 6.25 MG tablet Take 1 tablet (6.25 mg total) by mouth 2 (two) times daily. 180 tablet 3   cloNIDine (CATAPRES) 0.2 MG tablet Take 1 tablet (0.2 mg total) by mouth at bedtime. 30 tablet 11   dapagliflozin propanediol (FARXIGA) 10 MG TABS tablet Take 1 tablet (10 mg total) by mouth daily before breakfast. 30 tablet 4   ELIQUIS 5 MG TABS tablet Take 1 tablet (5 mg total) by mouth 2 (two) times daily. 180 tablet 1   Ferrous Sulfate (IRON) 325 (65 Fe) MG TABS Take 325 mg by mouth daily.  0   FLUoxetine (PROZAC) 40 MG capsule Take 40 mg by mouth daily.      fluticasone (FLONASE) 50 MCG/ACT nasal spray Place 1 spray into both nostrils daily as needed for allergies.     furosemide (LASIX) 80 MG tablet Take '80mg'$  by mouth in the morning and '40mg'$  in the afternoon     gabapentin (NEURONTIN) 300 MG capsule Take 600 mg by mouth at bedtime.     glipiZIDE (GLUCOTROL) 10 MG tablet Take 10 mg by mouth 2 (two) times daily before a meal.     glucose blood test strip 1 each by Other  route. Use as instructed     hydrocortisone 2.5 % cream Apply 1 application topically daily as needed (dry skin).      ipratropium (ATROVENT) 0.06 % nasal spray Place 2 sprays into both nostrils 3 (three) times daily. 15 mL 12   levocetirizine (XYZAL) 5 MG tablet  Take 5 mg by mouth daily.     levothyroxine (SYNTHROID, LEVOTHROID) 50 MCG tablet Take 50 mcg by mouth daily before breakfast.     montelukast (SINGULAIR) 10 MG tablet Take 10 mg by mouth at bedtime.     nystatin ointment (MYCOSTATIN) Apply 1 application topically daily as needed (rash). 100,000- units/gram     olmesartan (BENICAR) 20 MG tablet Take 0.5 tablets (10 mg total) by mouth daily. 45 tablet 1   omeprazole (PRILOSEC) 40 MG capsule Take 1 capsule (40 mg total) by mouth daily. 30 capsule 5   predniSONE (DELTASONE) 20 MG tablet Take 2 tablets (40 mg total) by mouth daily with breakfast. (Patient not taking: Reported on 12/14/2021) 10 tablet 0   temazepam (RESTORIL) 30 MG capsule Take 30 mg by mouth at bedtime.     traMADol (ULTRAM) 50 MG tablet Take 50 mg by mouth every 6 (six) hours as needed for moderate pain.     No current facility-administered medications for this visit.    No Known Allergies   REVIEW OF SYSTEMS:  '[X]'$  denotes positive finding, '[ ]'$  denotes negative finding Cardiac  Comments:  Chest pain or chest pressure:    Shortness of breath upon exertion:    Short of breath when lying flat:    Irregular heart rhythm:        Vascular    Pain in calf, thigh, or hip brought on by ambulation:    Pain in feet at night that wakes you up from your sleep:     Blood clot in your veins:    Leg swelling:         Pulmonary    Oxygen at home:    Productive cough:     Wheezing:         Neurologic    Sudden weakness in arms or legs:     Sudden numbness in arms or legs:     Sudden onset of difficulty speaking or slurred speech:    Temporary loss of vision in one eye:     Problems with dizziness:          Gastrointestinal    Blood in stool:     Vomited blood:         Genitourinary    Burning when urinating:     Blood in urine:        Psychiatric    Major depression:         Hematologic    Bleeding problems:    Problems with blood clotting too easily:        Skin    Rashes or ulcers:        Constitutional    Fever or chills:      PHYSICAL EXAMINATION:  There were no vitals filed for this visit.  General:  WDWN in NAD; vital signs documented above Gait: Not observed HENT: WNL, normocephalic Pulmonary: normal non-labored breathing , without wheezing Cardiac: regular HR,  Abdomen: soft, NT, no masses Skin: without rashes Vascular Exam/Pulses:  Right Left  Radial 2+ (normal) 2+ (normal)  Ulnar    Femoral 2+ 2+  Popliteal    DP 2+ Absent  PT absent absent   Extremities: without ischemic changes, without Gangrene , without cellulitis; without open wounds;  Musculoskeletal: no muscle wasting or atrophy  Neurologic: A&O X 3;  No focal weakness or paresthesias are detected Psychiatric:  The pt has Normal affect.   Non-Invasive Vascular Imaging:   ABI Findings:  +---------+------------------+-----+--------+--------+  Right    Rt Pressure (mmHg)IndexWaveformComment   +---------+------------------+-----+--------+--------+  Brachial 151                                      +---------+------------------+-----+--------+--------+  PTA      118               0.78 biphasic          +---------+------------------+-----+--------+--------+  DP       120               0.79 biphasic          +---------+------------------+-----+--------+--------+  Great Toe120               0.79                   +---------+------------------+-----+--------+--------+   +---------+------------------+-----+--------+-------+  Left     Lt Pressure (mmHg)IndexWaveformComment  +---------+------------------+-----+--------+-------+  Brachial 151                                      +---------+------------------+-----+--------+-------+  PTA      102               0.68 biphasic         +---------+------------------+-----+--------+-------+  DP       104               0.69 biphasic         +---------+------------------+-----+--------+-------+  Great Toe16                0.11                  +---------+------------------+-----+--------+-------+   +-------+-----------+-----------+------------+------------+  ABI/TBIToday's ABIToday's TBIPrevious ABIPrevious TBI  +-------+-----------+-----------+------------+------------+  Right  0.79       0.79                                 +-------+-----------+-----------+------------+------------+  Left   0.69       0.11                                 +-------+-----------+-----------+------------+------------+   Summary:  Right: 30-49% stenosis noted in the deep femoral artery. 30-49% stenosis  noted in the superficial femoral artery.   Left: 75-99% stenosis noted in the superficial femoral artery.     ASSESSMENT/PLAN: Tonya Glass is a 76 y.o. female presenting with abnormal ABIs.  ABIs reviewed demonstrating moderate peripheral arterial disease bilaterally with severely decreased toe pressure in the left foot.  Fortunately, Tonya Glass does not have rest pain nor tissue loss. In the absence of rest pain and tissue loss, Tonya Glass is best treated with medical management.  I had a long discussion with her regarding the above.  We also discussed her diabetes, and the importance of controlling her blood glucose as small vessel disease is not reversible.  She was asked to call my office immediately should rest pain or wounds occur on her feet.  My plan is to see her in 1 year for repeat ABI.  I asked that she continue her current medication regimen which includes Lipitor, Eliquis.   Broadus John, MD [facility] [phone]

## 2021-12-14 NOTE — Patient Instructions (Addendum)
Please schedule follow up scheduled with myself in 3 months.  If my schedule is not open yet, we will contact you with a reminder closer to that time. Please call [phone] if you haven't heard from [place] a month before.    Your breathing testing shows very mild early changes consistent with probable COPD from smoking.   I think your cough is related to a combination of acid reflux and post nasal drainage.   Start taking omeprazole 40 mg once daily for reflux that might be contributing to your cough.   You can stop your flonase, astelin nasal spray, and singulair if they are not helping. Continue xyzal and start taking atrovent nasal spray three times daily in your nose.  A sleep study is ordered and we will call you to schedule this - this will help your breathing problems related to your trachea collapsing and sleep as well.  Continue symbicort for now. You can take the albuterol nebs only when you need them if they help.  When you are ready the next steps for cough are seeing an ENT doctor and a GI doctor.

## 2021-12-14 NOTE — Progress Notes (Signed)
Makhia Vosler    [id number]    12-15-1945  Primary Care Physician:Freeman, Rocco Pauls, NP Date of Appointment: 12/14/2021 Established Patient Visit  Chief complaint:   Chief Complaint  Patient presents with   Follow-up    Follow-up: PFT Results     HPI: Mitchell Epling is a 76 y.o.n woman with tobacco use disorder and chronic cough.   Interval Updates: Here for follow up after PFTs. Which show normal pulmonary function but some evidence of air trapping and hyperinflation.  Feels that her breathing feels terrible after getting PFTS when they used the nose pin.  Takes symbicort 2 puffs twice a day Takes nebs for albuterol twice a day.   Takes flonase 1 spray once a day with singulair and xyzal for rhinitis. Still feels like the drainage is there and she doesn't feel better. She is not taking astelin nasal spray anymore.   Had a trial of prednisone for possible COPD exacerbation that did not improve her cough symptoms.   Denies reflux or heart burn but she has also had gastric weight loss surgery.   Has dyspnea both at rest and with minimal exertion with choking and gasping episodes.   Absolute eosinophil count 300.    I have reviewed the patient's family social and past medical history and updated as appropriate.   Past Medical History:  Diagnosis Date   Anxiety    Aortic valve stenosis 10/10/2016   Arthritis    "fingers" (07/24/2017)   CHF (congestive heart failure) (HCC)    COPD (chronic obstructive pulmonary disease) (HCC)    Depression    Gallstones    Heart murmur    High cholesterol    History of kidney stones    Hypertension    Hypothyroidism    Migraines    "none in years til 01/2013-02/2013; none since"  (07/24/2017)   Orthopnoea    "just since open heart surgery" (02/25/2013)   Pacemaker    [employer]    Pneumonia ~ 2012   "walking pneumonia"   Pneumonia 07/23/2017   Stroke ([place]) 2001   denies residual on 02/25/2013    Symptomatic hypotension 10/10/2016   Syncope and collapse    "prior to open heart surgery" (02/25/2013)   Type II diabetes mellitus ([place])     Past Surgical History:  Procedure Laterality Date   AORTIC ARCH ANGIOGRAPHY N/A 08/07/2021   Procedure: AORTIC ARCH ANGIOGRAPHY;  Surgeon: Sherren Mocha, MD;  Location: [facility];  Service: Cardiovascular;  Laterality: N/A;   BREAST SURGERY Right    "clamped blood vessel and stitched it up; in [place]"   [place]  01/26/2013   "cow valve put in; widened the second valve" (02/25/2013) - [place], [place]   CATARACT EXTRACTION W/ INTRAOCULAR LENS  IMPLANT, BILATERAL Bilateral 04/2017   CHOLECYSTECTOMY OPEN  1971   CYST EXCISION     "taken off my chest"   INSERT / REPLACE / REMOVE PACEMAKER  01/19/2013   [place] chamber   LAPAROSCOPIC GASTRIC SLEEVE RESECTION  2009   in [place]   RIGHT/LEFT [place] N/A 08/07/2021   Procedure: RIGHT/LEFT HEART CATH AND CORONARY ANGIOGRAPHY;  Surgeon: Sherren Mocha, MD;  Location: [facility];  Service: Cardiovascular;  Laterality: N/A;   TEE WITHOUT CARDIOVERSION N/A 06/13/2021   Procedure: TRANSESOPHAGEAL ECHOCARDIOGRAM (TEE);  Surgeon: Sanda Klein, MD;  Location: [facility];  Service: Cardiovascular;  Laterality: N/A;   VEIN SURGERY Left    LLE; cut leg open and took out piece of vein that was protruding thru skin; stitched it up; done in [place]    Family History  Problem Relation Age of Onset   Lung cancer Mother    Heart Problems Father    Hypertension Brother    Colon polyps Brother    Skin cancer Brother    Asthma Brother    Colon polyps Daughter    Diabetes Maternal Aunt    Breast cancer Maternal Aunt        mets to liver   Diabetes Paternal Aunt     Social History   Occupational History   Occupation: retired  Tobacco Use   Smoking status: Former    Packs/day: 2.00    Years: 40.00     Total pack years: 80.00    Types: Cigarettes    Quit date: 05/29/1999    Years since quitting: 22.5   Smokeless tobacco: Never  Vaping Use   Vaping Use: Never used  Substance and Sexual Activity   Alcohol use: No   Drug use: No   Sexual activity: Yes     Physical Exam: Blood pressure (!) 140/70, pulse 60, height 5' (1.524 m), weight 213 lb 8 oz (96.8 kg), SpO2 97 %.  Gen:      No acute distress, obese in wheelchair. ENT:  no nasal polyps, mucus membranes moist Lungs:    No increased respiratory effort, symmetric chest wall excursion, clear to auscultation bilaterally, no wheezes or crackles CV:         Regular rate and rhythm; no murmurs, rubs, or gallops.  No pedal edema   Data Reviewed: Imaging: I have personally reviewed the ct chest which shows LLL pulmonary nodule. tracheomalacia  PFTs:     Latest Ref Rng & Units 12/08/2021   12:02 PM  PFT Results  FVC-Pre L 0.95   FVC-Predicted Pre % 40   FVC-Post L 0.87   FVC-Predicted Post % 37   Pre FEV1/FVC % % 75   Post FEV1/FCV % % 80   FEV1-Pre L 0.72   FEV1-Predicted Pre % 41   FEV1-Post L 0.70   DLCO uncorrected ml/min/mmHg 12.16   DLCO UNC% % 73   DLCO corrected ml/min/mmHg 12.16   DLCO COR %Predicted % 73   DLVA Predicted % 134   TLC L 5.77   TLC % Predicted % 129   RV % Predicted % 214    I have personally reviewed the patient's PFTs and they show hyperinflation and air trapping  Labs: AEC 300  Immunization status: Immunization History  Administered Date(s) Administered   Influenza Split 01/26/2017   Influenza, High Dose Seasonal PF 01/22/2014, 01/22/2014, 02/11/2015, 02/11/2015, 02/02/2016, 12/27/2016, 01/25/2018, 01/22/2019   Influenza,inj,Quad PF,6+ Mos 01/22/2014, 02/11/2015   Influenza,inj,quad, With Preservative 02/02/2016   Influenza-Unspecified 01/26/2017   Pneumococcal Conjugate-13 12/08/2013, 10/04/2015   Pneumococcal Polysaccharide-23 06/24/2013, 06/22/2018   Pneumococcal-Unspecified  12/08/2013   Tetanus 06/20/2017   Zoster Recombinat (Shingrix) 02/13/2017, 02/13/2017   Zoster, Live 12/14/2013    External Records Personally Reviewed: cardiology  Assessment:  Dyspnea - multifactorial from HFPEF, obesity, deconditioning, EDAC(TBM) and copd Chronic cough - multifactorial from GERD with vasomotor rhinitis Gold stage 0 COPD LLL pulmonary nodule - needs repeat scan now.   Plan/Recommendations: Your breathing testing shows very mild early changes consistent with probable COPD from smoking.   I think your cough is related to a  combination of acid reflux and post nasal drainage.   Start taking omeprazole 40 mg once daily for reflux that might be contributing to your cough.   You can stop your flonase, astelin nasal spray, and singulair if they are not helping. Continue xyzal and start taking atrovent nasal spray three times daily in your nose.  Next steps for cough are seeing an ENT doctor and a GI doctor. She wants to wait until her vascular interventional procedures are complete.   A sleep study is ordered and we will call you to schedule this - this will help your breathing problems related to your trachea collapsing (TBM) and sleep as well.  Continue symbicort for now. You can take the albuterol nebs only when you need them if they help.   Return to Care: Return in about 3 months (around 03/16/2022).   Lenice Llamas, MD [facility] [place]

## 2021-12-15 ENCOUNTER — Ambulatory Visit (INDEPENDENT_AMBULATORY_CARE_PROVIDER_SITE_OTHER): Payer: Medicare Other | Admitting: Vascular Surgery

## 2021-12-15 ENCOUNTER — Telehealth: Payer: Self-pay | Admitting: Internal Medicine

## 2021-12-15 ENCOUNTER — Encounter: Payer: Self-pay | Admitting: Vascular Surgery

## 2021-12-15 VITALS — BP 130/71 | HR 74 | Temp 98.0°F | Resp 20 | Ht 60.0 in | Wt 213.0 lb

## 2021-12-15 DIAGNOSIS — I739 Peripheral vascular disease, unspecified: Secondary | ICD-10-CM

## 2021-12-15 NOTE — Telephone Encounter (Signed)
Patient called and stated that she is wondering why a CT of her chest was ordered. She states it was never brought up in office visit yesterday.   Please advise

## 2021-12-15 NOTE — Telephone Encounter (Signed)
Called and spoke to patient about the reason for the CT scan. Patient states that she is unable to do the CT scan right now. I advised patient that when she is ready to schedule her CT scan to call the office and ask to speak to our [facility] about scheduling her CT scan. And that when we get the report back on the CT scan we will follow up with her based on the results. Nothing further needed

## 2021-12-15 NOTE — Telephone Encounter (Addendum)
She has a lung nodule that needs follow up that I did not get to mention to her. Patty Sermons tried calling her yesterday evening but didn't get a hold of her. Patty Sermons spoke with her daughter Eustaquio Maize and relayed the reason for the CT scan.

## 2021-12-22 ENCOUNTER — Other Ambulatory Visit: Payer: Self-pay

## 2021-12-22 DIAGNOSIS — I739 Peripheral vascular disease, unspecified: Secondary | ICD-10-CM

## 2022-01-09 ENCOUNTER — Other Ambulatory Visit: Payer: Medicare Other

## 2022-01-10 ENCOUNTER — Ambulatory Visit (INDEPENDENT_AMBULATORY_CARE_PROVIDER_SITE_OTHER): Payer: Medicare Other

## 2022-01-10 DIAGNOSIS — I495 Sick sinus syndrome: Secondary | ICD-10-CM

## 2022-01-10 LAB — CUP PACEART REMOTE DEVICE CHECK
Battery Remaining Longevity: 4 mo
Battery Remaining Percentage: 6 %
Brady Statistic RA Percent Paced: 80 %
Brady Statistic RV Percent Paced: 81 %
Date Time Interrogation Session: 20230830044500
Implantable Lead Implant Date: 20140908
Implantable Lead Implant Date: 20140908
Implantable Lead Location: 753859
Implantable Lead Location: 753860
Implantable Lead Model: 4135
Implantable Lead Model: 4136
Implantable Lead Serial Number: 29485718
Implantable Lead Serial Number: 29487623
Implantable Pulse Generator Implant Date: 20140908
Lead Channel Impedance Value: 559 Ohm
Lead Channel Impedance Value: 735 Ohm
Lead Channel Pacing Threshold Amplitude: 0.7 V
Lead Channel Pacing Threshold Pulse Width: 0.4 ms
Lead Channel Setting Pacing Amplitude: 2 V
Lead Channel Setting Pacing Amplitude: 2.5 V
Lead Channel Setting Pacing Pulse Width: 0.4 ms
Lead Channel Setting Sensing Sensitivity: 2.5 mV
Pulse Gen Serial Number: 160873

## 2022-02-02 NOTE — Progress Notes (Signed)
Remote pacemaker transmission.   

## 2022-02-08 ENCOUNTER — Encounter: Payer: Self-pay | Admitting: Cardiovascular Disease

## 2022-02-08 ENCOUNTER — Ambulatory Visit: Payer: Medicare Other | Attending: Cardiovascular Disease | Admitting: Cardiovascular Disease

## 2022-02-08 VITALS — BP 134/76 | HR 67 | Wt 213.0 lb

## 2022-02-08 DIAGNOSIS — Z953 Presence of xenogenic heart valve: Secondary | ICD-10-CM

## 2022-02-08 DIAGNOSIS — I441 Atrioventricular block, second degree: Secondary | ICD-10-CM

## 2022-02-08 DIAGNOSIS — I2510 Atherosclerotic heart disease of native coronary artery without angina pectoris: Secondary | ICD-10-CM

## 2022-02-08 DIAGNOSIS — I5032 Chronic diastolic (congestive) heart failure: Secondary | ICD-10-CM

## 2022-02-08 DIAGNOSIS — I2781 Cor pulmonale (chronic): Secondary | ICD-10-CM | POA: Diagnosis not present

## 2022-02-08 DIAGNOSIS — I872 Venous insufficiency (chronic) (peripheral): Secondary | ICD-10-CM

## 2022-02-08 DIAGNOSIS — I10 Essential (primary) hypertension: Secondary | ICD-10-CM

## 2022-02-08 DIAGNOSIS — N1832 Chronic kidney disease, stage 3b: Secondary | ICD-10-CM

## 2022-02-08 DIAGNOSIS — I495 Sick sinus syndrome: Secondary | ICD-10-CM

## 2022-02-08 DIAGNOSIS — I2721 Secondary pulmonary arterial hypertension: Secondary | ICD-10-CM

## 2022-02-08 DIAGNOSIS — Z9889 Other specified postprocedural states: Secondary | ICD-10-CM

## 2022-02-08 DIAGNOSIS — I351 Nonrheumatic aortic (valve) insufficiency: Secondary | ICD-10-CM

## 2022-02-08 DIAGNOSIS — I484 Atypical atrial flutter: Secondary | ICD-10-CM

## 2022-02-08 DIAGNOSIS — E1122 Type 2 diabetes mellitus with diabetic chronic kidney disease: Secondary | ICD-10-CM

## 2022-02-08 DIAGNOSIS — Z950 Presence of cardiac pacemaker: Secondary | ICD-10-CM

## 2022-02-08 DIAGNOSIS — D6869 Other thrombophilia: Secondary | ICD-10-CM

## 2022-02-08 DIAGNOSIS — I739 Peripheral vascular disease, unspecified: Secondary | ICD-10-CM

## 2022-02-08 NOTE — Progress Notes (Signed)
lk

## 2022-02-08 NOTE — Patient Instructions (Signed)
Medication Instructions:  No changes *If you need a refill on your cardiac medications before your next appointment, please call your pharmacy*   Lab Work: Your provider would like for you to have labs today  If you have labs (blood work) drawn today and your tests are completely normal, you will receive your results only by: [place] (if you have MyChart) OR A paper copy in the mail If you have any lab test that is abnormal or we need to change your treatment, we will call you to review the results.   Testing/Procedures: None ordered   Follow-Up: At [facility], you and your health needs are our priority.  As part of our continuing mission to provide you with exceptional heart care, we have created designated Provider Care Teams.  These Care Teams include your primary Cardiologist (physician) and Advanced Practice Providers (APPs -  Physician Assistants and Nurse Practitioners) who all work together to provide you with the care you need, when you need it.  We recommend signing up for the patient portal called "MyChart".  Sign up information is provided on this After Visit Summary.  MyChart is used to connect with patients for Virtual Visits (Telemedicine).  Patients are able to view lab/test results, encounter notes, upcoming appointments, etc.  Non-urgent messages can be sent to your provider as well.   To learn more about what you can do with MyChart, go to [email or web address].    Your next appointment:   6 month(s)  The format for your next appointment:   In Person  Provider:   Dr. Sallyanne Kuster  Important Information About [place]

## 2022-02-08 NOTE — Progress Notes (Signed)
Cardiology Office Note    Date:  02/13/2022   ID:  Tonya Glass, DOB 1945/12/28, MRN [id number]  PCP:  Roetta Sessions, NP  Cardiologist:  Kathy Breach, M.D.; Sanda Klein, MD   Chief complaint: CHF, aortic insufficiency  History of Present Illness:  Tonya Glass is a 76 y.o. 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  She had paroxysmal atrial flutter and underwent successful overdrive pacing in 7902, with recurrence in January 2022 that resolved spontaneously.  She has minor CAD by angiography (40% mid LAD pre-AVR 2014; mild diffuse nonobstructive irregularity by angiography 2023).      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  TEE, CT angiography and aortic angiography also showed moderate aortic insufficiency with a perivalvular leak in the vicinity of the left coronary ostium.    Mitral regurgitation, which appeared moderate-severe by transthoracic echo, was much less significant at the time of the TEE, suggesting a secondary/functional mechanism.    Right and left heart catheterization with aortic angiography performed 08/07/2021 was useful in several ways: it confirmed that aortic insufficiency is only moderate; showed that left heart filling pressures were normal (PAWP 13, LVEDP 16) consistent with compensated left heart failure and inconsistent with the presence of severe aortic insufficiency; showed that she has moderate to severe pulmonary artery hypertension not to due to left heart failure (mean PA pressure 34 mmHg, transpulmonary gradient 21 mmHg, PVR 4.2  Wood units.  She is a full-time resident at [employer].  Her leg edema has been generally well controlled.  She is wearing compression stockings today.  She does not have any weeping wounds or open ulcers.  She denies orthopnea or PND but she always prefers sleeping in a recliner to keep her legs elevated and to protect her tailbone.  She has not had any chest pain.  She denies palpitations, dizziness or syncope.  She has not had focal neurological complaints.  She denies any bleeding problems.  Continues to have a cough productive of thick mucoid sputum every morning.    She continues to have significant daytime hypersomnolence.  She saw Dr. Lenice Llamas in the pulmonary clinic and there are plans for a sleep study but this has not been done yet.  A CT of the chest was also recommended but the patient declined for now.  A trial of prednisone did not help the cough.  Symbicort and a trial of omeprazole was recommended.  She had labs ordered yesterday but the staff at [facility] were not unable to draw them since she is a "hard stick".  We tried in the office today and could only get enough blood for a metabolic panel, not for the other labs planned.  She has significant daytime hypersomnolence.  She has dropped a cup from her hand because she fell asleep.  She cannot finish watching the movie or reading more than a few words in a book before falling asleep.  She takes naps all day long.  She feels tired all the time.  Scored 9 on the Epworth score today and checked the boxes on every single item on the review of systems for sleep apnea associated complaints.  She  has numerous family members including her son and her daughter who have obstructive sleep apnea.  Presenting rhythm today was normal sinus rhythm with 1: 1 AV conduction.  She has 80% atrial pacing and 80% ventricular pacing on the average.  Her battery is approaching ERI and we will start doing monthly battery checks.  She had a 4-hour episode of  rate controlled atrial flutter on 01/05/2022.  She has not had any significant episodes of high ventricular rate (a single 4 beat nonsustained VT occurred back in July).   On surface ECG she has markedly prolonged AV conduction time when in sinus rhythm with a PR interval of 300 ms.  She also has right bundle branch block.    She weighs 213 pounds today, the same as in early August.  At the time of her right heart catheterization she weighed only 196 pounds, but she has probably gained some true weight since then.  Labs obtained at the office visit today showed a potassium of 5.0 and a creatinine of 2.0, compared to a previous baseline creatinine around 1.2-1.6.  She was taking furosemide 80 mg twice daily based on the records from [place].  After we obtained the above results we recommended decreasing the furosemide back to 80 mg in the morning and 40 mg in the afternoon.  Past Medical History:  Diagnosis Date   Anxiety    Aortic valve stenosis 10/10/2016   Arthritis    "fingers" (07/24/2017)   CHF (congestive heart failure) (HCC)    COPD (chronic obstructive pulmonary disease) (HCC)    Depression    Gallstones    Heart murmur    High cholesterol    History of kidney stones    Hypertension    Hypothyroidism    Migraines    "none in years til 01/2013-02/2013; none since"  (07/24/2017)   Orthopnoea    "just since open heart surgery" (02/25/2013)   Pacemaker    [employer]    Pneumonia ~ 2012   "walking pneumonia"   Pneumonia 07/23/2017   Stroke ([place]) 2001   denies residual on 02/25/2013   Symptomatic hypotension 10/10/2016   Syncope and collapse    "prior to open heart surgery" (02/25/2013)   Type II diabetes mellitus ([place])     Past Surgical History:  Procedure Laterality Date   AORTIC ARCH ANGIOGRAPHY N/A 08/07/2021   Procedure: AORTIC ARCH ANGIOGRAPHY;  Surgeon: Sherren Mocha, MD;  Location: [facility];  Service: Cardiovascular;  Laterality: N/A;   BREAST  SURGERY Right    "clamped blood vessel and stitched it up; in [place]"   [place]  01/26/2013   "cow valve put in; widened the second valve" (02/25/2013) - [place], [place]   CATARACT EXTRACTION W/ INTRAOCULAR LENS  IMPLANT, BILATERAL Bilateral 04/2017   CHOLECYSTECTOMY OPEN  1971   CYST EXCISION     "taken off my chest"   INSERT / REPLACE / REMOVE PACEMAKER  01/19/2013   [place] chamber   LAPAROSCOPIC GASTRIC SLEEVE RESECTION  2009   in [place]   RIGHT/LEFT [place] N/A 08/07/2021   Procedure: RIGHT/LEFT HEART CATH AND CORONARY ANGIOGRAPHY;  Surgeon: Sherren Mocha, MD;  Location: [facility];  Service: Cardiovascular;  Laterality: N/A;   TEE WITHOUT CARDIOVERSION N/A 06/13/2021   Procedure: TRANSESOPHAGEAL ECHOCARDIOGRAM (TEE);  Surgeon: Sanda Klein, MD;  Location: [facility];  Service: Cardiovascular;  Laterality: N/A;   VEIN  SURGERY Left    LLE; cut leg open and took out piece of vein that was protruding thru skin; stitched it up; done in [place]    Current Medications: Outpatient Medications Prior to Visit  Medication Sig Dispense Refill   albuterol (PROVENTIL HFA;VENTOLIN HFA) 108 (90 BASE) MCG/ACT inhaler Inhale 2 puffs into the lungs every 6 (six) hours as needed for wheezing or shortness of breath. Use as directed -  With exercise     alendronate (FOSAMAX) 70 MG tablet Take 70 mg by mouth every Sunday.     amLODipine (NORVASC) 2.5 MG tablet Take 2.5 mg every night 90 tablet 3   atorvastatin (LIPITOR) 40 MG tablet Take 40 mg by mouth daily.     budesonide-formoterol (SYMBICORT) 160-4.5 MCG/ACT inhaler Inhale 2 puffs into the lungs 2 (two) times daily.     carvedilol (COREG) 6.25 MG tablet Take 1 tablet (6.25 mg total) by mouth 2 (two) times daily. 180 tablet 3   cloNIDine (CATAPRES) 0.2 MG tablet Take 1 tablet (0.2 mg total) by mouth at bedtime. 30 tablet 11   dapagliflozin propanediol  (FARXIGA) 10 MG TABS tablet Take 1 tablet (10 mg total) by mouth daily before breakfast. 30 tablet 4   ELIQUIS 5 MG TABS tablet Take 1 tablet (5 mg total) by mouth 2 (two) times daily. 180 tablet 1   Ferrous Sulfate (IRON) 325 (65 Fe) MG TABS Take 325 mg by mouth daily.  0   FLUoxetine (PROZAC) 40 MG capsule Take 40 mg by mouth daily.      fluticasone (FLONASE) 50 MCG/ACT nasal spray Place 1 spray into both nostrils daily as needed for allergies.     furosemide (LASIX) 80 MG tablet Take '80mg'$  by mouth in the morning and '40mg'$  in the afternoon     gabapentin (NEURONTIN) 300 MG capsule Take 600 mg by mouth at bedtime.     glipiZIDE (GLUCOTROL) 10 MG tablet Take 10 mg by mouth 2 (two) times daily before a meal.     ipratropium (ATROVENT) 0.06 % nasal spray Place 2 sprays into both nostrils 3 (three) times daily. 15 mL 12   levocetirizine (XYZAL) 5 MG tablet Take 5 mg by mouth daily.     levothyroxine (SYNTHROID, LEVOTHROID) 50 MCG tablet Take 50 mcg by mouth daily before breakfast.     montelukast (SINGULAIR) 10 MG tablet Take 10 mg by mouth at bedtime.     olmesartan (BENICAR) 20 MG tablet Take 0.5 tablets (10 mg total) by mouth daily. 45 tablet 1   omeprazole (PRILOSEC) 40 MG capsule Take 1 capsule (40 mg total) by mouth daily. 30 capsule 5   temazepam (RESTORIL) 30 MG capsule Take 30 mg by mouth at bedtime.     albuterol (PROVENTIL) (2.5 MG/3ML) 0.083% nebulizer solution Take 2.5 mg by nebulization 3 (three) times daily as needed for wheezing or shortness of breath. (Patient not taking: Reported on 02/08/2022)     glucose blood test strip 1 each by Other route. Use as instructed (Patient not taking: Reported on 02/08/2022)     hydrocortisone 2.5 % cream Apply 1 application topically daily as needed (dry skin).  (Patient not taking: Reported on 02/08/2022)     nystatin ointment (MYCOSTATIN) Apply 1 application topically daily as needed (rash). 100,000- units/gram (Patient not taking: Reported on  02/08/2022)     predniSONE (DELTASONE) 20 MG tablet Take 2 tablets (40 mg total) by mouth daily with breakfast. (Patient not taking: Reported on 02/08/2022) 10 tablet 0  traMADol (ULTRAM) 50 MG tablet Take 50 mg by mouth every 6 (six) hours as needed for moderate pain. (Patient not taking: Reported on 02/08/2022)     No facility-administered medications prior to visit.     Allergies:   Patient has no known allergies.   Social History   Socioeconomic History   Marital status: Widowed    Spouse name: Not on file   Number of children: 2   Years of education: dental sch   Highest education level: Not on file  Occupational History   Occupation: retired  Tobacco Use   Smoking status: Former    Packs/day: 2.00    Years: 40.00    Total pack years: 80.00    Types: Cigarettes    Quit date: 05/29/1999    Years since quitting: 22.7   Smokeless tobacco: Never  Vaping Use   Vaping Use: Never used  Substance and Sexual Activity   Alcohol use: No   Drug use: No   Sexual activity: Yes  Other Topics Concern   Not on file  Social History Narrative   Not on file   Social Determinants of Health   Financial Resource Strain: Not on file  Food Insecurity: Not on file  Transportation Needs: Not on file  Physical Activity: Not on file  Stress: Not on file  Social Connections: Not on file     Family History:  The patient's family history includes Asthma in her brother; Breast cancer in her maternal aunt; Colon polyps in her brother and daughter; Diabetes in her maternal aunt and paternal aunt; Heart Problems in her father; Hypertension in her brother; Lung cancer in her mother; Skin cancer in her brother.   ROS:   Please see the history of present illness.    ROS All other systems are reviewed and are negative.   PHYSICAL EXAM:   VS:  BP 134/76 (BP Location: Left Arm, Patient Position: Sitting, Cuff Size: Normal)   Pulse 67   Wt 213 lb (96.6 kg)   SpO2 99%   BMI 41.60 kg/m        General: Alert, oriented x3, no distress, appears comfortable Head: no evidence of trauma, PERRL, EOMI, no exophtalmos or lid lag, no myxedema, no xanthelasma; normal ears, nose and oropharynx Neck: normal jugular venous pulsations and no hepatojugular reflux; brisk carotid pulses without delay and no carotid bruits Chest: clear to auscultation, no signs of consolidation by percussion or palpation, normal fremitus, symmetrical and full respiratory excursions Cardiovascular: normal position and quality of the apical impulse, regular rhythm, normal first and second heart sounds, no murmurs, rubs or gallops Abdomen: no tenderness or distention, no masses by palpation, no abnormal pulsatility or arterial bruits, normal bowel sounds, no hepatosplenomegaly Extremities: no clubbing, cyanosis; wearing compression stockings with about 1-2+ residual edema; 2+ radial, ulnar and brachial pulses bilaterally; 2+ right femoral, posterior tibial and dorsalis pedis pulses; 2+ left femoral, posterior tibial and dorsalis pedis pulses; no subclavian or femoral bruits Neurological: grossly nonfocal Psych: Normal mood and affect     Wt Readings from Last 3 Encounters:  02/08/22 213 lb (96.6 kg)  12/15/21 213 lb (96.6 kg)  12/14/21 213 lb 8 oz (96.8 kg)      Studies/Labs Reviewed:   TEE 06/13/2021  1. Left ventricular ejection fraction, by estimation, is 60 to 65%. The  left ventricle has normal function. The left ventricle has no regional  wall motion abnormalities. Left ventricular diastolic parameters are  consistent with Grade II diastolic  dysfunction (pseudonormalization). Elevated left atrial pressure.   2. Right ventricular systolic function is mildly reduced. The right  ventricular size is mildly enlarged. There is mildly elevated pulmonary  artery systolic pressure. The estimated right ventricular systolic  pressure is 58.5 mmHg.   3. Left atrial size was mildly dilated. No left  atrial/left atrial  appendage thrombus was detected.   4. Right atrial size was mildly dilated.   5. 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 The effective  regurgitant orifice area is 0.2 cm sq, regurgitant volume 45 ml  (regurgitant fraction is inaccurate due  to significant aortic insufficiency. The mitral valve is rheumatic. Mild  to moderate mitral valve regurgitation. No evidence of mitral stenosis.   6. Tricuspid valve regurgitation is moderate.   7. There appear to be two areas of perivalvular leak of the bioprosthetic  AVR. A larger jet is located at 3 o'clock in the vicinity of the left  coronary cusp. A smaller jet is located posteriorly at 12 o'clock towards  the mitral annulus. There is also a   trivial intravalvular jet. The aortic valve has been repaired/replaced.  Aortic valve regurgitation is moderate. There is a 23 mm Magna valve  present in the aortic position. Procedure Date: 2014 [facility]. Aortic  valve mean gradient measures 13.9 mmHg.  Aortic valve Vmax measures 2.70 m/s. Aortic valve acceleration time  measures 74 msec.   8. S/P ascending aorta graft repair, which is intact. Aortic  root/ascending aorta has been repaired/replaced. There is Moderate (Grade  III) plaque involving the descending aorta.   Cardiac CT angiography 07/06/2021 1.  Status post 65m Perimount Magna Ease aortic valve replacement   2. There is a paravalular leak adjacent to the left coronary cusp. PVL measures 854mx 39m439mn diameter with perimeter 14m66md area 14mm97mand involved angle 34 degrees. There also appears to be a  second small PVL in the left coronary cusp close to the commissure between the left and noncoronary cusps that measures 4mm x18mm in29mameter with perimeter 11mm an639mea 6mm^2, a9minvolved angle 15 degrees.   3. Coronary calcium score 500 (86th percentile)    Right and left  heart catheterization 08/07/2021  1.  Patent coronary arteries with mild diffuse nonobstructive irregularity throughout.  There are no high-grade stenoses present. 2.  3+ aortic insufficiency based on angiographic assessment  3.  Unable to direct a straight tip wire from the paravalvular space into the left ventricle 4.  Normal wedge pressure and LVEDP of 13 mmHg and 16 mmHg, respectively 5.  Moderate pulmonary hypertension with mean PA pressure 34 mmHg, transpulmonary gradient 21 mmHg, PVR 4.2 Wood units   Plan: Continue medical therapy, review case with multidisciplinary heart valve team for consideration of further treatment options. Fick Cardiac Output 4.97 L/min  Fick Cardiac Output Index 2.65 (L/min)/BSA  RA A Wave 13 mmHg  RA V Wave 13 mmHg  RA Mean 8 mmHg  RV Systolic Pressure 46 mmHg  RV Diastolic Pressure 2 mmHg  RV EDP 10 mmHg  PA Systolic Pressure 50 mmHg  PA Diastolic Pressure 16 mmHg  PA Mean 34 mmHg  PW A Wave 17 mmHg  PW V Wave 14 mmHg  PW Mean 13 mmHg  AO Systolic Pressure 191 mmHg 277 Diastolic Pressure 57 mmHg  AO Mean 111 mmHg 824 Systolic Pressure 206 mmHg 235 Diastolic Pressure 1 mmHg  LV  EDP 16 mmHg  AOp Systolic Pressure 098 mmHg  AOp Diastolic Pressure 56 mmHg  AOp Mean Pressure 119 mmHg  LVp Systolic Pressure 147 mmHg  LVp Diastolic Pressure 3 mmHg  LVp EDP Pressure 13 mmHg  QP/QS 1  TPVR Index 12.83 HRUI  TSVR Index 41.87 HRUI  PVR SVR Ratio 0.2  TPVR/TSVR Ratio 0.31      EKG:  EKG is not ordered today and personally reviewed.  Presenting intracardiac electrogram is AV sequential pacing.  Most recent tracing shows AV sequential pacing with a very long delay at about 400 ms.  Some of the QRS complexes are narrow, suggesting some degree of native conduction with fusion.  Note positive R wave in lead V1.    ASSESSMENT:    1. Chronic diastolic heart failure ([place])   2. Cor pulmonale, chronic ([place])   3. PAH (pulmonary artery hypertension) ([place])    4. Peripheral venous insufficiency   5. S/P aortic valve replacement with bioprosthetic valve   6. Nonrheumatic aortic (valve) insufficiency: Perivalvular leak   7. H/O aortic root repair   8. SSS (sick sinus syndrome) (HCC)   9. Second degree atrioventricular block   10. Pacemaker   11. Essential hypertension   12. Coronary artery disease involving native coronary artery of native heart without angina pectoris   13. Atypical atrial flutter (HCC)   14. Acquired thrombophilia ([place])   15. Type 2 diabetes mellitus with stage 3b chronic kidney disease, without long-term current use of insulin (HCC)      PLAN:  In order of problems listed above:  CHF: She is hypervolemic today, but only mildly so.  As before, she has predominantly findings of right heart failure, although she does have biventricular disease.  Her edema is better than at previous visits, although her weight has really not changed compared with 2 months ago.  Skin ulcerations have healed.  In addition to a well-managed component of left heart failure, the dominant abnormality is persistent pulmonary hypertension, even when left heart filling pressures are normal.  This is consistent with intrinsic pulmonary arteriolar disease.  Sleep study is planned.  On the day of cardiac catheterization her weight was documented as 196 pounds and she is 17 pounds higher today, but it looks like she is gaining some true weight as well.  Continue Farxiga 10 mg daily, will cut back her furosemide to 80 mg in the morning 40 mg in the afternoon in view of her labs obtained today.  Reassess her "dry weight" at 215 pounds or less. 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  Pending sleep study.  Even when her pulmonary wedge pressure was in normal range she continues to have edema and signs and symptoms of right heart failure.  Preserved cardiac output.  This may be partly due to COPD (she has clear evidence of chronic bronchitis),  but also suspect a component of obstructive sleep apnea.  ABG at the time of cardiac catheterization showed normal PO2 levels, but I am not sure whether she was wearing oxygen.  PCO2 was elevated at 48, consistent with some degree of hypoventilation.  Thankfully, right heart failure is improved to the degree that her skin has healed. Stasis dermatitis/skin ulcerations: Ulcers have healed, important to prevent recurrent severe edema Venous stasis ulcer/peripheral venous insufficiency:  Doppler ultrasound performed 04/21/2021 did not show any evidence of DVT or venous reflux that would be amenable to treatment.  S/p AVR: Longstanding perivalvular aortic insufficiency, present  since the initial surgery.   Paravalvular aortic insufficiency: This is moderate by multiple imaging modalities including TEE, CT angiography and conventional aortic angiography.  The major leak is located in the vicinity of the left coronary ostium.  This does not appear to be severe enough to justify device closure and also does not appear to be logistical amenable to device closure.  Hemodynamics did not suggest severe aortic insufficiency at the time of invasive catheterization.  Attempts to cross the perivalvular defect with a guidewire were unsuccessful, suggesting this will be very difficult to correct with percutaneous means. History of aortic root replacement: Intact ascending aortic repair by TEE and CT. SSS: She has sinus bradycardia and very long AV conduction time but is not pacemaker dependent.  She is quite sedentary, current device settings appear appropriate Second-degree AV block: We have prolong the AV conduction time to the maximum allowed by her device and she still has about 80% ventricular pacing.  Trying to avoid ventricular pacing due to heart failure, but I am not sure this is achievable.  We may decide to reprogram her device to physiological AV delays which will result in 100% ventricular pacing.  Currently has  preserved left ventricular systolic function and CRT is not indicated. PPM: Normal device function with roughly 80% atrial pacing and atrial percent ventricular pacing.  Approaching ERI in about 4 months, will start monthly checks for battery HTN: Adequate control. Coronary atherosclerosis.  No angina pectoris.  Nonobstructive lesions by catheterization 2014 and in 2023, no evidence of disease progression.  However, calcium score on the most recent CT is high at 500 (86 percentile).  The focus is on risk factor modification. Aflutter: Had a 4-hour episode of atrial flutter on August 25, rate controlled and asymptomatic, the first event since January, when it also stopped spontaneously.  Successfully underwent overdrive pacing in April 2021.  The burden of arrhythmia is low and antiarrhythmics are not indicated.  On appropriate Eliquis anticoagulation. CHA2DS2-VASc 7 (age 82, gender, HTN, CHF, DM, CAD). Anticoagulation: No bleeding problems.  On Eliquis. DM: Added Farxiga for heart failure.  So far no problems with hypoglycemia.  She is still also taking glipizide.  We were unable to draw enough blood for hemoglobin A1c today. CKD3b: Slight worsening of creatinine from baseline.  Decrease the furosemide to 80 mg in the morning and 40 mg in the evening. HLP: On statin.  Target LDL less than 70 . Hypothyroidism: Appears clinically euthyroid on levothyroxine supplementation.  Unable to draw enough blood for TSH today. Anemia: Hemoglobin stable at 11.9.  Normocytic indices.  Unable to check CBC today. COPD: Continues to have a productive cough with thick s mucus, worse every morning, consistent with chronic bronchitis.  She is not currently taking an anticholinergic agent or an inhaled corticosteroid, but is on beta agonists.  Seeing Dr. Shearon Stalls. OSA/obesity hypoventilation syndrome: Symptoms strongly supportive of this diagnosis.  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  Suspect this is a more important driver of pulmonary hypertension than the COPD.  Has seen pulmonary, but sleep study has not yet been performed.    Medication Adjustments/Labs and Tests Ordered: Current medicines are reviewed at length with the patient today.  Concerns regarding medicines are outlined above.  Medication changes, Labs and Tests ordered today are listed in the Patient Instructions below. Patient Instructions  Medication Instructions:  No changes *If you need a refill on  your cardiac medications before your next appointment, please call your pharmacy*   Lab Work: Your provider would like for you to have labs today  If you have labs (blood work) drawn today and your tests are completely normal, you will receive your results only by: [place] (if you have MyChart) OR A paper copy in the mail If you have any lab test that is abnormal or we need to change your treatment, we will call you to review the results.   Testing/Procedures: None ordered   Follow-Up: At [facility], you and your health needs are our priority.  As part of our continuing mission to provide you with exceptional heart care, we have created designated Provider Care Teams.  These Care Teams include your primary Cardiologist (physician) and Advanced Practice Providers (APPs -  Physician Assistants and Nurse Practitioners) who all work together to provide you with the care you need, when you need it.  We recommend signing up for the patient portal called "MyChart".  Sign up information is provided on this After Visit Summary.  MyChart is used to connect with patients for Virtual Visits (Telemedicine).  Patients are able to view lab/test results, encounter notes, upcoming appointments, etc.  Non-urgent messages can be sent to your provider as well.   To learn more about what you can do with MyChart, go to [email or web address].    Your next  appointment:   6 month(s)  The format for your next appointment:   In Person  Provider:   Dr. Sallyanne Kuster  Important Information About Sugar          Signed, Sanda Klein, MD  02/13/2022 2:01 PM    [address], [place], [place]  [address] Phone: [phone]; Fax: [phone]

## 2022-02-09 LAB — COMPREHENSIVE METABOLIC PANEL
ALT: 13 IU/L (ref 0–32)
AST: 22 IU/L (ref 0–40)
Albumin/Globulin Ratio: 1.6 (ref 1.2–2.2)
Albumin: 4.1 g/dL (ref 3.8–4.8)
Alkaline Phosphatase: 138 IU/L — ABNORMAL HIGH (ref 44–121)
BUN/Creatinine Ratio: 19 (ref 12–28)
BUN: 39 mg/dL — ABNORMAL HIGH (ref 8–27)
Bilirubin Total: 0.3 mg/dL (ref 0.0–1.2)
CO2: 22 mmol/L (ref 20–29)
Calcium: 9.2 mg/dL (ref 8.7–10.3)
Chloride: 101 mmol/L (ref 96–106)
Creatinine, Ser: 2.01 mg/dL — ABNORMAL HIGH (ref 0.57–1.00)
Globulin, Total: 2.5 g/dL (ref 1.5–4.5)
Glucose: 148 mg/dL — ABNORMAL HIGH (ref 70–99)
Potassium: 5 mmol/L (ref 3.5–5.2)
Sodium: 144 mmol/L (ref 134–144)
Total Protein: 6.6 g/dL (ref 6.0–8.5)
eGFR: 25 mL/min/{1.73_m2} — ABNORMAL LOW (ref >59)

## 2022-02-16 ENCOUNTER — Telehealth: Payer: Self-pay | Admitting: Cardiovascular Disease

## 2022-02-16 NOTE — Telephone Encounter (Signed)
Daughter wanted to know if current furosemide order was sent to [facility]. Informed daughter a fax was sent to [place] regarding the order.

## 2022-02-16 NOTE — Telephone Encounter (Signed)
Lesly Rubenstein from [facility] calling about orders and lab results being faxed. Informed they were faxed on 02/12/22 but she said they never received it. She confirmed the fax number [phone]

## 2022-02-16 NOTE — Telephone Encounter (Signed)
See lab results note.

## 2022-02-16 NOTE — Telephone Encounter (Signed)
Pt's daughter states that a medication change was supposed to be sent to the assisted living that pt is currently at. Assisted living states that they have not received anything from our office. Daughter would like a callback in regards to this matter. Please advise

## 2022-02-16 NOTE — Telephone Encounter (Signed)
Order and lab results faxed a second time to same number provided. Confirmation received again.

## 2022-03-16 ENCOUNTER — Telehealth: Payer: Self-pay | Admitting: Cardiovascular Disease

## 2022-03-16 NOTE — Telephone Encounter (Signed)
Patient calling to find out when she will need to have her battery replaced.

## 2022-03-16 NOTE — Telephone Encounter (Signed)
Spoke with patient informed her that she had 3 months until ERI as of transmission that came through on 10/29, and once she hits ERI we have an additional 3 months to get device changed out. Patient voiced understanding

## 2022-04-11 ENCOUNTER — Ambulatory Visit (INDEPENDENT_AMBULATORY_CARE_PROVIDER_SITE_OTHER): Payer: Medicare Other

## 2022-04-11 DIAGNOSIS — I495 Sick sinus syndrome: Secondary | ICD-10-CM

## 2022-04-11 LAB — CUP PACEART REMOTE DEVICE CHECK
Battery Remaining Longevity: 3 mo — CL
Battery Remaining Percentage: 3 %
Brady Statistic RA Percent Paced: 78 %
Brady Statistic RV Percent Paced: 74 %
Date Time Interrogation Session: 20231129133600
Implantable Lead Connection Status: 753985
Implantable Lead Connection Status: 753985
Implantable Lead Implant Date: 20140908
Implantable Lead Implant Date: 20140908
Implantable Lead Location: 753859
Implantable Lead Location: 753860
Implantable Lead Model: 4135
Implantable Lead Model: 4136
Implantable Lead Serial Number: 29485718
Implantable Lead Serial Number: 29487623
Implantable Pulse Generator Implant Date: 20140908
Lead Channel Impedance Value: 556 Ohm
Lead Channel Impedance Value: 721 Ohm
Lead Channel Pacing Threshold Amplitude: 0.7 V
Lead Channel Pacing Threshold Pulse Width: 0.4 ms
Lead Channel Setting Pacing Amplitude: 2 V
Lead Channel Setting Pacing Amplitude: 2.5 V
Lead Channel Setting Pacing Pulse Width: 0.4 ms
Lead Channel Setting Sensing Sensitivity: 2.5 mV
Pulse Gen Serial Number: 160873
Zone Setting Status: 755011

## 2022-04-17 ENCOUNTER — Encounter: Payer: Self-pay | Admitting: Internal Medicine

## 2022-04-17 ENCOUNTER — Telehealth: Payer: Self-pay

## 2022-04-17 ENCOUNTER — Ambulatory Visit: Payer: Medicare Other | Attending: Cardiology | Admitting: Internal Medicine

## 2022-04-17 DIAGNOSIS — I5032 Chronic diastolic (congestive) heart failure: Secondary | ICD-10-CM

## 2022-04-17 DIAGNOSIS — I2721 Secondary pulmonary arterial hypertension: Secondary | ICD-10-CM | POA: Diagnosis not present

## 2022-04-17 DIAGNOSIS — Z953 Presence of xenogenic heart valve: Secondary | ICD-10-CM | POA: Diagnosis not present

## 2022-04-17 DIAGNOSIS — Z950 Presence of cardiac pacemaker: Secondary | ICD-10-CM

## 2022-04-17 NOTE — Telephone Encounter (Signed)
Called and left a voice message informing patient to check MyChart to view her AVS for Dr. Lysbeth Penner recommendations and to give our office a call if she has any questions. AVS also mailed to patient.

## 2022-04-17 NOTE — Patient Instructions (Addendum)
Medication Instructions:   Your physician recommends that you continue on your current medications as directed. Please refer to the Current Medication list given to you today.  *If you need a refill on your cardiac medications before your next appointment, please call your pharmacy*  Lab Work: NONE ordered at this time of appointment   If you have labs (blood work) drawn today and your tests are completely normal, you will receive your results only by: Mobile (if you have MyChart) OR A paper copy in the mail If you have any lab test that is abnormal or we need to change your treatment, we will call you to review the results.  Testing/Procedures: NONE ordered at this time of appointment   Follow-Up: At [facility], you and your health needs are our priority.  As part of our continuing mission to provide you with exceptional heart care, we have created designated Provider Care Teams.  These Care Teams include your primary Cardiologist (physician) and Advanced Practice Providers (APPs -  Physician Assistants and Nurse Practitioners) who all work together to provide you with the care you need, when you need it.  Your next appointment:   1 year(s)  The format for your next appointment:   In Person or Virtual   Provider:   Dr. [facility]      Other Instructions  Important Information About Sugar

## 2022-04-17 NOTE — Progress Notes (Signed)
Virtual Visit via Video Note   This visit type was conducted due to national recommendations for restrictions regarding the COVID-19 Pandemic (e.g. social distancing) in an effort to limit this patient's exposure and mitigate transmission in our community.  Due to her co-morbid illnesses, this patient is at least at moderate risk for complications without adequate follow up.  This format is felt to be most appropriate for this patient at this time.  The patient does have access to video technology.  All issues noted in this document were discussed and addressed.  A limited physical exam could be performed with this format.  Please refer to the patient's chart for her  consent to telehealth for [facility].   Evaluation Performed: Video follow-up  Date:  04/17/2022   ID:  Makynleigh Breslin, DOB 09-08-1945, MRN [id number]  Patient Location:  [place] [address] [place] [address]  Provider location:   [address] [place], [place] [address]  PCP:  Roetta Sessions, NP  Cardiologist:  Pixie Casino, MD Electrophysiologist:  None   Chief Complaint: No complaints  History of Present Illness:    Katiejo Gilroy is a 76 y.o. female who presents via audio/video conferencing for a telehealth visit today.  [name] is a pleasant 76 year old female that I am following with a history of aortic valve replacement, sick sinus syndrome status post pacemaker, diastolic congestive heart failure, COPD, difficult to control hypertension, dyslipidemia and type 2 diabetes.  She frequently visits friends in [place] and was recently in [place] just prior to the outbreak of coronavirus.  Fortunately she was not affected and her family gave her masks to bring back with her.  Since then she is essentially isolated at home.  She denies any worsening shortness of breath or chest pain.  She had a remote pacer check in February which was functioning normally and has an  upcoming remote pacer check soon.  She reports her PCP just did some blood work on her which was faxed to our office however I do not have that immediately available to review.  10/13/2020  Mrs. Marland returns today for follow-up.  Overall she says she is feeling very well.  She just had a recent remote pacer check yesterday.  She was noted to have some short bursts of nonsustained VT up to 14 beats as well as some atrial flutter lasting over 3 hours at 1 point which spontaneously converted.  She had another episode in January with spontaneous conversion.  She says she is not really that symptomatic with palpitations.  She denies any chest pain or worsening shortness of breath.  Echo last year showed stable gradients across her bioprosthetic aortic valve without dilatation of her ascending aortic graft.  Her LVEF was normal.  In March of this year she was noted to have some increasing shortness of breath.  BNP was mildly elevated in the 300s and felt to be due to some diastolic congestive failure.  She also has some moderate chronic kidney disease.  Her Lasix was increased to 40 mg daily and she reports some improvement in her edema.  She still has some dependent edema.  It was reported by CT in 2019 that she had some dilatation of the descending thoracic aorta which was calcified.  This has not yet been reevaluated.  I recommended a repeat CT angiogram with the thoracic aorta to evaluate both the graft and her descending aortic aneurysm however she declined stating that  she "would not have surgery again".  11/08/2021  Ms. Muilenburg was seen today for video follow-up.  She has had an eventful past 6 months.  Early in January she underwent an echocardiogram after being seen by Dr. Sallyanne Kuster.  She has had some progressive shortness of breath and the echo suggested significant aortic insufficiency/perivalvular leak.  Ultimately she underwent transesophageal echocardiography by Dr. Loletha Grayer and was noted to have  significant perivalvular leak.  She was then sent for CT angiography of the aorta/TAVR protocol work-up.  Ultimately, she underwent left and right heart catheterization which showed minimal nonobstructive coronary disease but 3+ perivalvular leak.  This was performed by Dr. Burt Knack who made attempts to try to access the perivalvular space to see if he could potentially close this leak but this was apparently unsuccessful.  Subsequently she has had no further attempts to try to mitigate this that I am aware of.  She did undergo lower extremity arterial Dopplers which showed a high-grade left SFA stenosis.  She was contacted by vascular surgery about this but declined an appointment in May.  She now tells me that she wishes to schedule an appointment with them.  She says she has no left leg or left buttocks claudication but is not very active.  She notes that her blood pressures actually been reasonably well controlled although did have a low blood pressure yesterday.  She is on 80 mg furosemide in the morning and 40 mg in the afternoon.  04/17/2022  Ms. Baltes is seen today in follow-up.  She was hospitalized in October at [facility] with acute hypoxic respiratory failure.  This was thought to be due to combination of pneumonia and her heart failure.  Cardiology had evaluated her.  Her troponins were elevated at 16 and 1800, respectively.  She did not have any chest pain.  Of note her cath earlier this year had shown mild diffuse nonobstructive coronary disease.  This was likely demand ischemia.  She improved significantly with therapy and then apparently had left AGAINST MEDICAL ADVICE.  She said that she was a difficult stick and did not feel that she needed to be in the hospital any longer.  Since that time, she has done very well.  She reports her weight is now at about 208 pounds, down about 5 pounds since prior to that admission.  She denies any chest pain.  A recent remote pacemaker  check shows that she is approaching ERI in about 3 months or so.  Dr. Loletha Grayer is aware of this.  Prior CV studies:   The following studies were reviewed today:  Chart review  PMHx:  Past Medical History:  Diagnosis Date   Anxiety    Aortic valve stenosis 10/10/2016   Arthritis    "fingers" (07/24/2017)   CHF (congestive heart failure) (HCC)    COPD (chronic obstructive pulmonary disease) (HCC)    Depression    Gallstones    Heart murmur    High cholesterol    History of kidney stones    Hypertension    Hypothyroidism    Migraines    "none in years til 01/2013-02/2013; none since"  (07/24/2017)   Orthopnoea    "just since open heart surgery" (02/25/2013)   Pacemaker    [employer]    Pneumonia ~ 2012   "walking pneumonia"   Pneumonia 07/23/2017   Stroke ([place]) 2001   denies residual on 02/25/2013   Symptomatic hypotension 10/10/2016   Syncope and collapse    "  prior to open heart surgery" (02/25/2013)   Type II diabetes mellitus [facility])     Past Surgical History:  Procedure Laterality Date   AORTIC ARCH ANGIOGRAPHY N/A 08/07/2021   Procedure: AORTIC ARCH ANGIOGRAPHY;  Surgeon: Sherren Mocha, MD;  Location: [place] [facility];  Service: Cardiovascular;  Laterality: N/A;   BREAST SURGERY Right    "clamped blood vessel and stitched it up; in [place]"   [place]  01/26/2013   "cow valve put in; widened the second valve" (02/25/2013) - [place], [place]   CATARACT EXTRACTION W/ INTRAOCULAR LENS  IMPLANT, BILATERAL Bilateral 04/2017   CHOLECYSTECTOMY OPEN  1971   CYST EXCISION     "taken off my chest"   INSERT / REPLACE / REMOVE PACEMAKER  01/19/2013   [place] chamber   LAPAROSCOPIC GASTRIC SLEEVE RESECTION  2009   in [place]   RIGHT/LEFT [place] N/A 08/07/2021   Procedure: RIGHT/LEFT HEART CATH AND CORONARY ANGIOGRAPHY;  Surgeon: Sherren Mocha, MD;  Location: [place] [facility];  Service:  Cardiovascular;  Laterality: N/A;   TEE WITHOUT CARDIOVERSION N/A 06/13/2021   Procedure: TRANSESOPHAGEAL ECHOCARDIOGRAM (TEE);  Surgeon: Sanda Klein, MD;  Location: [facility];  Service: Cardiovascular;  Laterality: N/A;   VEIN SURGERY Left    LLE; cut leg open and took out piece of vein that was protruding thru skin; stitched it up; done in [place]    FAMHx:  Family History  Problem Relation Age of Onset   Lung cancer Mother    Heart Problems Father    Hypertension Brother    Colon polyps Brother    Skin cancer Brother    Asthma Brother    Colon polyps Daughter    Diabetes Maternal Aunt    Breast cancer Maternal Aunt        mets to liver   Diabetes Paternal Aunt     SOCHx:   reports that she quit smoking about 22 years ago. Her smoking use included cigarettes. She has a 80.00 pack-year smoking history. She has never used smokeless tobacco. She reports that she does not drink alcohol and does not use drugs.  ALLERGIES:  No Known Allergies  MEDS:  Current Meds  Medication Sig   albuterol (PROVENTIL HFA;VENTOLIN HFA) 108 (90 BASE) MCG/ACT inhaler Inhale 2 puffs into the lungs every 6 (six) hours as needed for wheezing or shortness of breath. Use as directed -  With exercise   amLODipine (NORVASC) 2.5 MG tablet Take 2.5 mg every night   atorvastatin (LIPITOR) 40 MG tablet Take 40 mg by mouth daily.   budesonide-formoterol (SYMBICORT) 160-4.5 MCG/ACT inhaler Inhale 2 puffs into the lungs 2 (two) times daily.   carvedilol (COREG) 6.25 MG tablet Take 1 tablet (6.25 mg total) by mouth 2 (two) times daily.   cloNIDine (CATAPRES) 0.2 MG tablet Take 1 tablet (0.2 mg total) by mouth at bedtime.   dapagliflozin propanediol (FARXIGA) 10 MG TABS tablet Take 1 tablet (10 mg total) by mouth daily before breakfast.   ELIQUIS 5 MG TABS tablet Take 1 tablet (5 mg total) by mouth 2 (two) times daily.   Ferrous Sulfate (IRON) 325 (65 Fe) MG TABS Take 325 mg by mouth daily.   FLUoxetine  (PROZAC) 40 MG capsule Take 40 mg by mouth daily.    fluticasone (FLONASE) 50 MCG/ACT nasal spray Place 1 spray into both nostrils daily as needed for allergies.   furosemide (LASIX)  80 MG tablet Take '80mg'$  by mouth in the morning and '40mg'$  in the afternoon   gabapentin (NEURONTIN) 300 MG capsule Take 600 mg by mouth at bedtime.   glipiZIDE (GLUCOTROL) 10 MG tablet Take 10 mg by mouth 2 (two) times daily before a meal.   levocetirizine (XYZAL) 5 MG tablet Take 5 mg by mouth daily.   levothyroxine (SYNTHROID, LEVOTHROID) 50 MCG tablet Take 50 mcg by mouth daily before breakfast.   montelukast (SINGULAIR) 10 MG tablet Take 10 mg by mouth at bedtime.   nystatin ointment (MYCOSTATIN) Apply 1 application  topically daily as needed (rash). 100,000- units/gram   olmesartan (BENICAR) 20 MG tablet Take 0.5 tablets (10 mg total) by mouth daily.   omeprazole (PRILOSEC) 40 MG capsule Take 1 capsule (40 mg total) by mouth daily.   predniSONE (DELTASONE) 20 MG tablet Take 2 tablets (40 mg total) by mouth daily with breakfast.   temazepam (RESTORIL) 30 MG capsule Take 30 mg by mouth at bedtime.   traMADol (ULTRAM) 50 MG tablet Take 50 mg by mouth every 6 (six) hours as needed for moderate pain.     ROS: Pertinent items noted in HPI and remainder of comprehensive ROS otherwise negative.  Labs/Other Tests and Data Reviewed:    Recent Labs: 04/18/2021: BNP 329.9 07/19/2021: Platelets 192 08/07/2021: Hemoglobin 11.9 02/08/2022: ALT 13; BUN 39; Creatinine, Ser 2.01; Potassium 5.0; Sodium 144   Recent Lipid Panel No results found for: "CHOL", "TRIG", "HDL", "CHOLHDL", "LDLCALC", "LDLDIRECT"  Wt Readings from Last 3 Encounters:  02/08/22 213 lb (96.6 kg)  12/15/21 213 lb (96.6 kg)  12/14/21 213 lb 8 oz (96.8 kg)     Exam:    Vital Signs:  There were no vitals taken for this visit.   General appearance: alert and no distress Lungs: no visual respiratory difficulty Abdomen: obese Extremities: extremities  normal, atraumatic, no cyanosis or edema Neurologic: Grossly normal  ASSESSMENT & PLAN:    Chronic diastolic congestive heart failure - stable AV gradient, LVEF 55-60% (2021) S/p [employer] pacemaker - complete heart block ? 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 Halder was recently hospitalized at [facility] for acute hypoxic respiratory failure in October.  She did have elevated troponins in the 16-1800 range, but no chest pain and this was felt to be demand ischemia.  She has improved after diuresis and with antibiotic therapy.  She denies any chest pain.  Her recent remote check showed no atrial fibrillation.  She is not pacemaker dependent and is approaching ERI in the next few months.  She will schedule with Dr. Loletha Grayer for elective generator change out.  I am happy to see her back annually or sooner as necessary.  No med changes were made today.  COVID-19 Education: The signs and symptoms of COVID-19 were discussed with the patient and how to seek care for testing (follow up with PCP or arrange E-visit).  The importance of social distancing was discussed today.  Patient  Risk:   After full review of this patients clinical status, I feel that they are at least moderate risk at this time.  Time:   Today, I have spent 25 minutes with the patient with telehealth technology discussing bioprosthetic aortic  valve, pacemaker, hypertension, hyperlipidemia, chronic diastolic heart failure.     Medication Adjustments/Labs and Tests Ordered: Current medicines are reviewed at length with the patient today.  Concerns regarding medicines are outlined above.   Tests Ordered: No orders of the defined types were placed in this encounter.   Medication Changes: No orders of the defined types were placed in this encounter.   Disposition:  in 6 month(s)  Pixie Casino, MD, [facility] Director of the Advanced Lipid Disorders &  Cardiovascular Risk Reduction Clinic Diplomate of the American Board of Clinical Lipidology Attending Cardiologist  Direct Dial: [phone]  Fax: [phone]  Website:  [email or web address]  Pixie Casino, MD  04/17/2022 9:41 AM

## 2022-04-17 NOTE — Telephone Encounter (Signed)
  Patient Consent for Virtual Visit        Tonya Glass has provided verbal consent on 04/17/2022 for a virtual visit (video or telephone).   CONSENT FOR VIRTUAL VISIT FOR:  Tonya Glass  By participating in this virtual visit I agree to the following:  I hereby voluntarily request, consent and authorize [facility] and its employed or contracted physicians, physician assistants, nurse practitioners or other licensed health care professionals (the Practitioner), to provide me with telemedicine health care services (the "Services") as deemed necessary by the treating Practitioner. I acknowledge and consent to receive the Services by the Practitioner via telemedicine. I understand that the telemedicine visit will involve communicating with the Practitioner through live audiovisual communication technology and the disclosure of certain medical information by electronic transmission. I acknowledge that I have been given the opportunity to request an in-person assessment or other available alternative prior to the telemedicine visit and am voluntarily participating in the telemedicine visit.  I understand that I have the right to withhold or withdraw my consent to the use of telemedicine in the course of my care at any time, without affecting my right to future care or treatment, and that the Practitioner or I may terminate the telemedicine visit at any time. I understand that I have the right to inspect all information obtained and/or recorded in the course of the telemedicine visit and may receive copies of available information for a reasonable fee.  I understand that some of the potential risks of receiving the Services via telemedicine include:  Delay or interruption in medical evaluation due to technological equipment failure or disruption; Information transmitted may not be sufficient (e.g. poor resolution of images) to allow for appropriate medical decision making by the Practitioner;  and/or  In rare instances, security protocols could fail, causing a breach of personal health information.  Furthermore, I acknowledge that it is my responsibility to provide information about my medical history, conditions and care that is complete and accurate to the best of my ability. I acknowledge that Practitioner's advice, recommendations, and/or decision may be based on factors not within their control, such as incomplete or inaccurate data provided by me or distortions of diagnostic images or specimens that may result from electronic transmissions. I understand that the practice of medicine is not an exact science and that Practitioner makes no warranties or guarantees regarding treatment outcomes. I acknowledge that a copy of this consent can be made available to me via my patient portal ([facility]), or I can request a printed copy by calling the office of [facility].    I understand that my insurance will be billed for this visit.   I have read or had this consent read to me. I understand the contents of this consent, which adequately explains the benefits and risks of the Services being provided via telemedicine.  I have been provided ample opportunity to ask questions regarding this consent and the Services and have had my questions answered to my satisfaction. I give my informed consent for the services to be provided through the use of telemedicine in my medical care

## 2022-05-01 ENCOUNTER — Telehealth: Payer: Self-pay | Admitting: Cardiovascular Disease

## 2022-05-01 NOTE — Telephone Encounter (Signed)
Patient stated that a week ago she started having fluid retention in her legs and feet. At the same time, she developed a pressure injury to her lt ankle. She is having the wound dressed today. Her SOB is unchanged. If there are orders, she wants them faxed to [facility] Phone: [phone]; verified fax: [phone]. May leave message on patient's cell. Patient's concern is the edema in legs/feet.

## 2022-05-01 NOTE — Telephone Encounter (Signed)
Pt c/o swelling: STAT is pt has developed SOB within 24 hours  If swelling, where is the swelling located? legs  How much weight have you gained and in what time span? A few pounds not sure  Have you gained 3 pounds in a day or 5 pounds in a week? Not sure  Do you have a log of your daily weights (if so, list)? no  Are you currently taking a fluid pill? yes  Are you currently SOB? no  Have you traveled recently? no  She states she is retaining fluid. She says she knows she has gained weight, because she can feel it, but is not sure how much. She says she is in an assisted living and a wound doctor is coming today to clean her sore and wrap it. She says the fluid retention is also affecting her voice and she sounds raspy.

## 2022-05-01 NOTE — Telephone Encounter (Signed)
Unable to reach patient. Spoke with daughter regarding lasix order. Daughter stated that the practitioner at the facility ordered lasix '80mg'$  twice daily for three days last week. Advised her not to give lasix '80mg'$  at night until cleared with Dr. Sallyanne Kuster. She also stated the food that is served there is very salty. She would like to have an order for low sodium diet. She stated she bought Morton's light salt. Recommended not to use due to K+. Recommended different spices or low Na margarine to flavor foods. She stated she will alert facility to notify our office when medication changes are made.

## 2022-05-01 NOTE — Telephone Encounter (Signed)
Left message regarding lasix dose, leg elevation, and to call clinic later this week to advise on edema.

## 2022-05-02 NOTE — Telephone Encounter (Signed)
Faxed over to the number provided.

## 2022-05-02 NOTE — Telephone Encounter (Signed)
Patient called stating she needs to have a prescription fax sent over to where she is living at for the extra lasix. Faxed number [phone]

## 2022-05-02 NOTE — Telephone Encounter (Signed)
Called patient,  She states they are needing the order faxed to the number below:   See message from MD-  She should definitely be on a low salt diet. I think salt substitutes are OK. OK to try furosemide 80 mg twice daily for another 3 days, even if also done last week.

## 2022-05-02 NOTE — Telephone Encounter (Signed)
Spoke to patient Dr.Croitoru's advice given.Stated she has appointment with PCP this morning.

## 2022-05-02 NOTE — Telephone Encounter (Signed)
Pt is returning call to update on what the PCP had said during her appt this morning. She states that an order is needed for lasix. Fax # [phone].

## 2022-05-02 NOTE — Telephone Encounter (Signed)
This encounter printed and faxed

## 2022-05-09 NOTE — Progress Notes (Signed)
Remote pacemaker transmission.   

## 2022-05-11 ENCOUNTER — Ambulatory Visit: Payer: Medicare Other

## 2022-05-11 LAB — CUP PACEART REMOTE DEVICE CHECK
Battery Remaining Longevity: 3 mo — CL
Battery Remaining Percentage: 0 %
Brady Statistic RA Percent Paced: 79 %
Brady Statistic RV Percent Paced: 75 %
Date Time Interrogation Session: 20231229044100
Implantable Lead Connection Status: 753985
Implantable Lead Connection Status: 753985
Implantable Lead Implant Date: 20140908
Implantable Lead Implant Date: 20140908
Implantable Lead Location: 753859
Implantable Lead Location: 753860
Implantable Lead Model: 4135
Implantable Lead Model: 4136
Implantable Lead Serial Number: 29485718
Implantable Lead Serial Number: 29487623
Implantable Pulse Generator Implant Date: 20140908
Lead Channel Impedance Value: 568 Ohm
Lead Channel Impedance Value: 721 Ohm
Lead Channel Pacing Threshold Amplitude: 0.7 V
Lead Channel Pacing Threshold Pulse Width: 0.4 ms
Lead Channel Setting Pacing Amplitude: 2 V
Lead Channel Setting Pacing Amplitude: 2.5 V
Lead Channel Setting Pacing Pulse Width: 0.4 ms
Lead Channel Setting Sensing Sensitivity: 2.5 mV
Pulse Gen Serial Number: 160873
Zone Setting Status: 755011

## 2022-05-24 ENCOUNTER — Telehealth: Payer: Self-pay

## 2022-05-24 NOTE — Telephone Encounter (Signed)
Called patient left message on personal voice mail to call me back.

## 2022-05-24 NOTE — Telephone Encounter (Signed)
If possible please schedule for 06/21/2021 at 3PM to follow roberts

## 2022-05-24 NOTE — Telephone Encounter (Signed)
Alert remote reviewed. Normal device function.   The device reached the elective replacement indicator on 05/24/2022, sent to triage Next remote 06/11/2022.  Kathy Breach, RN, CCDS, [employer]  Forwarding to Dr. Sallyanne Kuster and his nurse/scheduling team to follow up with patient

## 2022-05-25 ENCOUNTER — Telehealth: Payer: Self-pay

## 2022-05-25 ENCOUNTER — Other Ambulatory Visit: Payer: Self-pay

## 2022-05-25 DIAGNOSIS — Z950 Presence of cardiac pacemaker: Secondary | ICD-10-CM

## 2022-05-25 DIAGNOSIS — Z01812 Encounter for preprocedural laboratory examination: Secondary | ICD-10-CM

## 2022-05-25 NOTE — Telephone Encounter (Signed)
Spoke to patient.Pacemaker generator change scheduled Thurs 2/8 at 3:30 pm Arrive at 1:30 pm at [facility].Advised to come to office to see me 06/14/22 between 8:30 am to 12:00 noon to go over instructions and have pre procedure lab.

## 2022-05-25 NOTE — Telephone Encounter (Signed)
Implantable Device Instructions  You are scheduled for:                  Pacemaker                           _____ Generator Change  on  Thursday 06/21/22  with Dr. Sallyanne Kuster.  1.   Please arrive at the [facility], Entrance "A"  at [facility] at  1:30 pm on the day of your procedure. (The address is [address]  Do not eat or drink after midnight the night before your procedure.  3.   Complete pre procedure  lab work on Thursday 06/14/22 at [place] 8:30 am to 12:00 noon. You do not have to be fasting.  4.   A) - Do NOT take these medications for  2 days prior to your procedure: Eliquis           - Hold the following medications the morning of your procedure: Furosemide           - All of your remaining medications may be taken with a small amount of water the morning of your procedure.   5.  Plan for an overnight stay.  Bring your insurance cards and a list of you medications.  6.  Wash your chest and neck with surgical scrub the evening before and the morning of your procedure.  Rinse well. Please review the surgical scrub instruction sheet given       to you.                                                                                                                * If you have ANY questions after you get home, please call @ [phone].  * Every attempt is made to prevent procedures from being rescheduled.

## 2022-06-11 ENCOUNTER — Ambulatory Visit: Payer: 59

## 2022-06-11 DIAGNOSIS — I495 Sick sinus syndrome: Secondary | ICD-10-CM

## 2022-06-12 LAB — CUP PACEART REMOTE DEVICE CHECK
Brady Statistic RA Percent Paced: 79 %
Brady Statistic RV Percent Paced: 77 %
Date Time Interrogation Session: 20240129044100
Implantable Lead Connection Status: 753985
Implantable Lead Connection Status: 753985
Implantable Lead Implant Date: 20140908
Implantable Lead Implant Date: 20140908
Implantable Lead Location: 753859
Implantable Lead Location: 753860
Implantable Lead Model: 4135
Implantable Lead Model: 4136
Implantable Lead Serial Number: 29485718
Implantable Lead Serial Number: 29487623
Implantable Pulse Generator Implant Date: 20140908
Lead Channel Impedance Value: 562 Ohm
Lead Channel Impedance Value: 730 Ohm
Lead Channel Pacing Threshold Amplitude: 0.7 V
Lead Channel Pacing Threshold Pulse Width: 0.4 ms
Lead Channel Setting Pacing Amplitude: 2 V
Lead Channel Setting Pacing Amplitude: 2.5 V
Lead Channel Setting Pacing Pulse Width: 0.4 ms
Lead Channel Setting Sensing Sensitivity: 2.5 mV
Pulse Gen Serial Number: 160873
Zone Setting Status: 755011

## 2022-06-13 ENCOUNTER — Telehealth: Payer: Self-pay | Admitting: Cardiovascular Disease

## 2022-06-13 NOTE — Telephone Encounter (Signed)
Patient states she is supposed to have lab work done tomorrow and see Malachy Mood for her instructions for her upcoming procedure. She states she is not sure when she is supposed to see Malachy Mood tomorrow.

## 2022-06-13 NOTE — Telephone Encounter (Signed)
Called patient, advised of blood work  needed. She states she needs the instructions for her gen changeMalachy Mood sent the instructions via mychart however patient does not read mychart. When she comes for labs she will request that we get these printed and given to her at this lab visit. I advised for her to let the lab tech know to reach out to triage and we would be glad to give her the instructions.   Patient verbalized understanding.   Thankful for call back.

## 2022-06-14 NOTE — Telephone Encounter (Signed)
Orders are in thanks!

## 2022-06-14 NOTE — Telephone Encounter (Signed)
Patient came by office and had blood work completed. Gave patient printed instructions and surgical scrub. Patient aware and verbalized understanding of instructions. Advised patient to call back to office with any issues, questions, or concerns. Patient verbalized understanding.

## 2022-06-15 LAB — CBC WITH DIFFERENTIAL/PLATELET
Basophils Absolute: 0 10*3/uL (ref 0.0–0.2)
Basos: 1 %
EOS (ABSOLUTE): 0.2 10*3/uL (ref 0.0–0.4)
Eos: 2 %
Hematocrit: 36.3 % (ref 34.0–46.6)
Hemoglobin: 11.5 g/dL (ref 11.1–15.9)
Immature Grans (Abs): 0 10*3/uL (ref 0.0–0.1)
Immature Granulocytes: 0 %
Lymphocytes Absolute: 1.4 10*3/uL (ref 0.7–3.1)
Lymphs: 17 %
MCH: 29.1 pg (ref 26.6–33.0)
MCHC: 31.7 g/dL (ref 31.5–35.7)
MCV: 92 fL (ref 79–97)
Monocytes Absolute: 0.6 10*3/uL (ref 0.1–0.9)
Monocytes: 8 %
Neutrophils Absolute: 6 10*3/uL (ref 1.4–7.0)
Neutrophils: 72 %
Platelets: 175 10*3/uL (ref 150–450)
RBC: 3.95 x10E6/uL (ref 3.77–5.28)
RDW: 13.4 % (ref 11.7–15.4)
WBC: 8.2 10*3/uL (ref 3.4–10.8)

## 2022-06-15 LAB — BASIC METABOLIC PANEL
BUN/Creatinine Ratio: 24 (ref 12–28)
BUN: 41 mg/dL — ABNORMAL HIGH (ref 8–27)
CO2: 27 mmol/L (ref 20–29)
Calcium: 9.2 mg/dL (ref 8.7–10.3)
Chloride: 102 mmol/L (ref 96–106)
Creatinine, Ser: 1.68 mg/dL — ABNORMAL HIGH (ref 0.57–1.00)
Glucose: 133 mg/dL — ABNORMAL HIGH (ref 70–99)
Potassium: 4.6 mmol/L (ref 3.5–5.2)
Sodium: 144 mmol/L (ref 134–144)
eGFR: 31 mL/min/{1.73_m2} — ABNORMAL LOW (ref >59)

## 2022-06-21 ENCOUNTER — Inpatient Hospital Stay (HOSPITAL_COMMUNITY)
Admission: AD | Admit: 2022-06-21 | Discharge: 2022-06-23 | DRG: 258 | Disposition: A | Discharge status: Skilled Nursing Facility | Payer: 59 | Attending: Cardiovascular Disease | Admitting: Cardiovascular Disease

## 2022-06-21 ENCOUNTER — Inpatient Hospital Stay (HOSPITAL_COMMUNITY): Payer: 59

## 2022-06-21 ENCOUNTER — Other Ambulatory Visit: Payer: Self-pay

## 2022-06-21 ENCOUNTER — Encounter (HOSPITAL_COMMUNITY): Payer: Self-pay | Admitting: *Deleted

## 2022-06-21 ENCOUNTER — Inpatient Hospital Stay (HOSPITAL_COMMUNITY)
Admission: AD | Disposition: A | Discharge status: Skilled Nursing Facility | Payer: Self-pay | Source: Home / Self Care | Attending: Cardiovascular Disease

## 2022-06-21 DIAGNOSIS — I2510 Atherosclerotic heart disease of native coronary artery without angina pectoris: Secondary | ICD-10-CM | POA: Diagnosis present

## 2022-06-21 DIAGNOSIS — R0902 Hypoxemia: Secondary | ICD-10-CM | POA: Diagnosis not present

## 2022-06-21 DIAGNOSIS — Z7984 Long term (current) use of oral hypoglycemic drugs: Secondary | ICD-10-CM

## 2022-06-21 DIAGNOSIS — Z7983 Long term (current) use of bisphosphonates: Secondary | ICD-10-CM

## 2022-06-21 DIAGNOSIS — I442 Atrioventricular block, complete: Secondary | ICD-10-CM | POA: Diagnosis present

## 2022-06-21 DIAGNOSIS — Z7901 Long term (current) use of anticoagulants: Secondary | ICD-10-CM

## 2022-06-21 DIAGNOSIS — Z7951 Long term (current) use of inhaled steroids: Secondary | ICD-10-CM

## 2022-06-21 DIAGNOSIS — Z8249 Family history of ischemic heart disease and other diseases of the circulatory system: Secondary | ICD-10-CM | POA: Diagnosis not present

## 2022-06-21 DIAGNOSIS — I4891 Unspecified atrial fibrillation: Secondary | ICD-10-CM | POA: Diagnosis present

## 2022-06-21 DIAGNOSIS — I509 Heart failure, unspecified: Principal | ICD-10-CM

## 2022-06-21 DIAGNOSIS — I5082 Biventricular heart failure: Secondary | ICD-10-CM | POA: Diagnosis present

## 2022-06-21 DIAGNOSIS — I5033 Acute on chronic diastolic (congestive) heart failure: Secondary | ICD-10-CM | POA: Diagnosis present

## 2022-06-21 DIAGNOSIS — I110 Hypertensive heart disease with heart failure: Secondary | ICD-10-CM | POA: Diagnosis present

## 2022-06-21 DIAGNOSIS — J449 Chronic obstructive pulmonary disease, unspecified: Secondary | ICD-10-CM | POA: Diagnosis present

## 2022-06-21 DIAGNOSIS — I351 Nonrheumatic aortic (valve) insufficiency: Secondary | ICD-10-CM

## 2022-06-21 DIAGNOSIS — Z6841 Body Mass Index (BMI) 40.0 and over, adult: Secondary | ICD-10-CM

## 2022-06-21 DIAGNOSIS — E119 Type 2 diabetes mellitus without complications: Secondary | ICD-10-CM | POA: Diagnosis present

## 2022-06-21 DIAGNOSIS — Z45010 Encounter for checking and testing of cardiac pacemaker pulse generator [battery]: Secondary | ICD-10-CM

## 2022-06-21 DIAGNOSIS — Z9841 Cataract extraction status, right eye: Secondary | ICD-10-CM

## 2022-06-21 DIAGNOSIS — K219 Gastro-esophageal reflux disease without esophagitis: Secondary | ICD-10-CM | POA: Diagnosis present

## 2022-06-21 DIAGNOSIS — I10 Essential (primary) hypertension: Secondary | ICD-10-CM | POA: Diagnosis present

## 2022-06-21 DIAGNOSIS — I50813 Acute on chronic right heart failure: Secondary | ICD-10-CM

## 2022-06-21 DIAGNOSIS — E039 Hypothyroidism, unspecified: Secondary | ICD-10-CM | POA: Diagnosis present

## 2022-06-21 DIAGNOSIS — I083 Combined rheumatic disorders of mitral, aortic and tricuspid valves: Secondary | ICD-10-CM | POA: Diagnosis present

## 2022-06-21 DIAGNOSIS — E7800 Pure hypercholesterolemia, unspecified: Secondary | ICD-10-CM | POA: Diagnosis present

## 2022-06-21 DIAGNOSIS — L89152 Pressure ulcer of sacral region, stage 2: Secondary | ICD-10-CM | POA: Diagnosis present

## 2022-06-21 DIAGNOSIS — I484 Atypical atrial flutter: Secondary | ICD-10-CM | POA: Diagnosis not present

## 2022-06-21 DIAGNOSIS — F419 Anxiety disorder, unspecified: Secondary | ICD-10-CM | POA: Diagnosis present

## 2022-06-21 DIAGNOSIS — Z950 Presence of cardiac pacemaker: Secondary | ICD-10-CM | POA: Diagnosis not present

## 2022-06-21 DIAGNOSIS — F32A Depression, unspecified: Secondary | ICD-10-CM | POA: Diagnosis present

## 2022-06-21 DIAGNOSIS — I4892 Unspecified atrial flutter: Secondary | ICD-10-CM | POA: Diagnosis present

## 2022-06-21 DIAGNOSIS — J810 Acute pulmonary edema: Secondary | ICD-10-CM

## 2022-06-21 DIAGNOSIS — Z79899 Other long term (current) drug therapy: Secondary | ICD-10-CM

## 2022-06-21 DIAGNOSIS — Z87442 Personal history of urinary calculi: Secondary | ICD-10-CM

## 2022-06-21 DIAGNOSIS — I495 Sick sinus syndrome: Secondary | ICD-10-CM | POA: Diagnosis present

## 2022-06-21 DIAGNOSIS — Z8673 Personal history of transient ischemic attack (TIA), and cerebral infarction without residual deficits: Secondary | ICD-10-CM

## 2022-06-21 DIAGNOSIS — I5032 Chronic diastolic (congestive) heart failure: Secondary | ICD-10-CM | POA: Diagnosis not present

## 2022-06-21 DIAGNOSIS — I2781 Cor pulmonale (chronic): Secondary | ICD-10-CM | POA: Diagnosis present

## 2022-06-21 DIAGNOSIS — Z952 Presence of prosthetic heart valve: Secondary | ICD-10-CM

## 2022-06-21 DIAGNOSIS — Z9842 Cataract extraction status, left eye: Secondary | ICD-10-CM

## 2022-06-21 DIAGNOSIS — L8990 Pressure ulcer of unspecified site, unspecified stage: Secondary | ICD-10-CM | POA: Insufficient documentation

## 2022-06-21 DIAGNOSIS — G4733 Obstructive sleep apnea (adult) (pediatric): Secondary | ICD-10-CM | POA: Diagnosis present

## 2022-06-21 DIAGNOSIS — Z825 Family history of asthma and other chronic lower respiratory diseases: Secondary | ICD-10-CM

## 2022-06-21 LAB — GLUCOSE, CAPILLARY
Glucose-Capillary: 141 mg/dL — ABNORMAL HIGH (ref 70–99)
Glucose-Capillary: 249 mg/dL — ABNORMAL HIGH (ref 70–99)

## 2022-06-21 LAB — CBC
HCT: 41.1 % (ref 36.0–46.0)
Hemoglobin: 12.6 g/dL (ref 12.0–15.0)
MCH: 29.6 pg (ref 26.0–34.0)
MCHC: 30.7 g/dL (ref 30.0–36.0)
MCV: 96.7 fL (ref 80.0–100.0)
Platelets: 203 10*3/uL (ref 150–400)
RBC: 4.25 MIL/uL (ref 3.87–5.11)
RDW: 14.6 % (ref 11.5–15.5)
WBC: 14 10*3/uL — ABNORMAL HIGH (ref 4.0–10.5)
nRBC: 0 % (ref 0.0–0.2)

## 2022-06-21 LAB — CREATININE, SERUM
Creatinine, Ser: 1.73 mg/dL — ABNORMAL HIGH (ref 0.44–1.00)
GFR, Estimated: 30 mL/min — ABNORMAL LOW (ref >60)

## 2022-06-21 LAB — BRAIN NATRIURETIC PEPTIDE: B Natriuretic Peptide: 517.5 pg/mL — ABNORMAL HIGH (ref 0.0–100.0)

## 2022-06-21 LAB — MAGNESIUM: Magnesium: 2.5 mg/dL — ABNORMAL HIGH (ref 1.7–2.4)

## 2022-06-21 SURGERY — INVASIVE LAB ABORTED CASE

## 2022-06-21 MED ORDER — FUROSEMIDE 10 MG/ML IJ SOLN
INTRAMUSCULAR | Status: DC | PRN
  Administered 2022-06-21: 40 mg via INTRAVENOUS

## 2022-06-21 MED ORDER — SODIUM CHLORIDE 0.9 % IV SOLN
INTRAVENOUS | Status: AC
  Filled 2022-06-21: qty 2

## 2022-06-21 MED ORDER — MORPHINE SULFATE (PF) 2 MG/ML IV SOLN
INTRAVENOUS | Status: AC
  Filled 2022-06-21: qty 1

## 2022-06-21 MED ORDER — PANTOPRAZOLE SODIUM 40 MG PO TBEC
40.0000 mg | DELAYED_RELEASE_TABLET | Freq: Every day | ORAL | Status: DC
  Administered 2022-06-22 – 2022-06-23 (×2): 40 mg via ORAL
  Filled 2022-06-21 (×2): qty 1

## 2022-06-21 MED ORDER — MORPHINE SULFATE (PF) 2 MG/ML IV SOLN
INTRAVENOUS | Status: DC | PRN
  Administered 2022-06-21 (×2): 1 mg via INTRAVENOUS

## 2022-06-21 MED ORDER — SODIUM CHLORIDE 0.9 % IV SOLN: INTRAVENOUS | Status: DC

## 2022-06-21 MED ORDER — CLONIDINE HCL 0.2 MG PO TABS
0.2000 mg | ORAL_TABLET | Freq: Every day | ORAL | Status: DC
  Administered 2022-06-22: 0.2 mg via ORAL
  Filled 2022-06-21 (×2): qty 1

## 2022-06-21 MED ORDER — FLUTICASONE FUROATE-VILANTEROL 200-25 MCG/ACT IN AEPB
1.0000 | INHALATION_SPRAY | Freq: Every day | RESPIRATORY_TRACT | Status: DC
  Administered 2022-06-22 – 2022-06-23 (×2): 1 via RESPIRATORY_TRACT
  Filled 2022-06-21: qty 28

## 2022-06-21 MED ORDER — HEPARIN SODIUM (PORCINE) 5000 UNIT/ML IJ SOLN
5000.0000 [IU] | Freq: Three times a day (TID) | INTRAMUSCULAR | Status: DC
  Filled 2022-06-21: qty 1

## 2022-06-21 MED ORDER — CEFAZOLIN SODIUM-DEXTROSE 2-4 GM/100ML-% IV SOLN
INTRAVENOUS | Status: AC
  Filled 2022-06-21: qty 100

## 2022-06-21 MED ORDER — CHLORHEXIDINE GLUCONATE 4 % EX LIQD
4.0000 | Freq: Once | CUTANEOUS | Status: DC
  Filled 2022-06-21: qty 60

## 2022-06-21 MED ORDER — TRAZODONE HCL 50 MG PO TABS
25.0000 mg | ORAL_TABLET | Freq: Every day | ORAL | Status: DC
  Administered 2022-06-21 – 2022-06-22 (×2): 25 mg via ORAL
  Filled 2022-06-21 (×3): qty 1

## 2022-06-21 MED ORDER — LEVOTHYROXINE SODIUM 50 MCG PO TABS
50.0000 ug | ORAL_TABLET | Freq: Every day | ORAL | Status: DC
  Administered 2022-06-22 – 2022-06-23 (×2): 50 ug via ORAL
  Filled 2022-06-21 (×2): qty 1

## 2022-06-21 MED ORDER — ATORVASTATIN CALCIUM 40 MG PO TABS
40.0000 mg | ORAL_TABLET | Freq: Every day | ORAL | Status: DC
  Administered 2022-06-22 – 2022-06-23 (×2): 40 mg via ORAL
  Filled 2022-06-21 (×2): qty 1

## 2022-06-21 MED ORDER — FUROSEMIDE 10 MG/ML IJ SOLN
INTRAMUSCULAR | Status: AC
  Filled 2022-06-21: qty 4

## 2022-06-21 MED ORDER — GABAPENTIN 300 MG PO CAPS
600.0000 mg | ORAL_CAPSULE | Freq: Every day | ORAL | Status: DC
  Administered 2022-06-21 – 2022-06-22 (×2): 600 mg via ORAL
  Filled 2022-06-21 (×2): qty 2

## 2022-06-21 MED ORDER — FUROSEMIDE 10 MG/ML IJ SOLN
40.0000 mg | Freq: Two times a day (BID) | INTRAMUSCULAR | Status: DC
  Administered 2022-06-22 – 2022-06-23 (×3): 40 mg via INTRAVENOUS
  Filled 2022-06-21 (×3): qty 4

## 2022-06-21 MED ORDER — DAPAGLIFLOZIN PROPANEDIOL 10 MG PO TABS
10.0000 mg | ORAL_TABLET | Freq: Every day | ORAL | Status: DC
  Administered 2022-06-22 – 2022-06-23 (×2): 10 mg via ORAL
  Filled 2022-06-21 (×2): qty 1

## 2022-06-21 MED ORDER — ACETAMINOPHEN 325 MG PO TABS: 650.0000 mg | ORAL_TABLET | Freq: Three times a day (TID) | ORAL | Status: DC | PRN

## 2022-06-21 MED ORDER — POVIDONE-IODINE 10 % EX SWAB
2.0000 | Freq: Once | CUTANEOUS | Status: AC
  Administered 2022-06-21: 2 via TOPICAL

## 2022-06-21 MED ORDER — IPRATROPIUM BROMIDE 0.06 % NA SOLN
2.0000 | Freq: Three times a day (TID) | NASAL | Status: DC
  Administered 2022-06-21 – 2022-06-23 (×5): 2 via NASAL
  Filled 2022-06-21: qty 15

## 2022-06-21 MED ORDER — LORATADINE 10 MG PO TABS
10.0000 mg | ORAL_TABLET | Freq: Every day | ORAL | Status: DC
  Administered 2022-06-21 – 2022-06-22 (×2): 10 mg via ORAL
  Filled 2022-06-21 (×2): qty 1

## 2022-06-21 MED ORDER — ALBUTEROL SULFATE (2.5 MG/3ML) 0.083% IN NEBU: 2.5000 mg | INHALATION_SOLUTION | Freq: Four times a day (QID) | RESPIRATORY_TRACT | Status: DC | PRN

## 2022-06-21 MED ORDER — MAGNESIUM OXIDE -MG SUPPLEMENT 400 (240 MG) MG PO TABS
400.0000 mg | ORAL_TABLET | Freq: Every day | ORAL | Status: DC
  Administered 2022-06-22: 400 mg via ORAL
  Filled 2022-06-21: qty 1

## 2022-06-21 MED ORDER — LIDOCAINE HCL (PF) 1 % IJ SOLN
INTRAMUSCULAR | Status: AC
  Filled 2022-06-21: qty 60

## 2022-06-21 MED ORDER — IRBESARTAN 150 MG PO TABS
150.0000 mg | ORAL_TABLET | Freq: Every day | ORAL | Status: DC
  Administered 2022-06-22 – 2022-06-23 (×2): 150 mg via ORAL
  Filled 2022-06-21 (×2): qty 1

## 2022-06-21 MED ORDER — SODIUM CHLORIDE 0.9 % IV SOLN: 80.0000 mg | INTRAVENOUS | Status: DC

## 2022-06-21 MED ORDER — SODIUM CHLORIDE 0.9% FLUSH
3.0000 mL | Freq: Two times a day (BID) | INTRAVENOUS | Status: DC
  Administered 2022-06-21 – 2022-06-23 (×4): 3 mL via INTRAVENOUS

## 2022-06-21 MED ORDER — CEFAZOLIN SODIUM-DEXTROSE 2-4 GM/100ML-% IV SOLN
2.0000 g | INTRAVENOUS | Status: AC
  Administered 2022-06-21: 2 g via INTRAVENOUS

## 2022-06-21 MED ORDER — CEFAZOLIN SODIUM-DEXTROSE 2-3 GM-%(50ML) IV SOLR: INTRAVENOUS | Status: DC | PRN

## 2022-06-21 MED ORDER — FLUOXETINE HCL 20 MG PO CAPS
40.0000 mg | ORAL_CAPSULE | Freq: Every day | ORAL | Status: DC
  Administered 2022-06-22 – 2022-06-23 (×2): 40 mg via ORAL
  Filled 2022-06-21 (×2): qty 2

## 2022-06-21 SURGICAL SUPPLY — 3 items
CABLE SURGICAL S-101-97-12 (CABLE) ×2 IMPLANT
PAD DEFIB RADIO PHYSIO CONN (PAD) ×2 IMPLANT
TRAY PACEMAKER INSERTION (PACKS) ×2 IMPLANT

## 2022-06-21 NOTE — Progress Notes (Addendum)
She was bought to the cath lab for generator change. While supine on the procedure table, she developed severe dyspnea, that persisted even when raised on a wedge. As she was being draped, dyspnea worsened and she had to sit up. The procedure was aborted before actually starting BP was 143/96 and RR 40, with O2 sat as low as 55%. A paced-V paced rhythm. Lung exam consistent with pulmonary edema. Furosemide IV and Morphine 1 mg IV was administered with some improvement. BP dropped to 103/70. O2 sat in low 90s when she allows O2 supplement, but keeps pushing O2 mask and even O2 cannula away, with sats dropping to 70%. She does not want intubation or CPR. I am sure that she will refuse BiPAP. She will require admission for acute heart failure/flash pulmonary edema. She did not take furosemide today for the planned pacemaker procedure. Together with (anxiety-related?) increase in BP, this may have caused acute heart failure exacerbation. Recheck echo in AM. The pacemaker reached ERI on 05/24/2022. We have another 2 months to get the generator change done. Discussed with her daughter, Eustaquio Maize.

## 2022-06-21 NOTE — Progress Notes (Signed)
Pt with large BM see I/O's, peri care provided, purewick removed R/T BM soiling, pt transferred to 6E bed per RN x5, air mattress under pt and used, sacral wound noted with pink colored drsg, removed r/t BM soiling noted, will inform 6E to place new drsg as [facility] does not carry these types of drsgs, pt remains alert, oriented and 6L [place] in place, tolerated well, although HOB remained elevated, safety maintained

## 2022-06-21 NOTE — H&P (Signed)
Cardiology Admission History and Physical   Patient ID: Tonya Glass MRN: [id number]; DOB: 10-23-1945   Admission date: 06/21/2022  PCP:  Roetta Sessions, NP   [facility] Cardiologist:  Pixie Casino, MD        Chief Complaint: Pacemaker battery change  Patient Profile:   Tonya Glass is a 77 y.o. female with Complete heart block who is being seen 06/21/2022 for the evaluation of pacemaker battery depletion.  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  Perimount Magna Ease, 8841, complicated by complete heart block and implantation of a dual-chamber permanent pacemaker ([place] 2014) with subsequent return of normal AV conduction, but still requiring pacemaker therapy for sinus bradycardia.  She has subsequently developed intermittent paroxysmal atrial flutter with successful overdrive pacing in 6606 and spontaneous resolution in January 2022.    Her pacemaker reached elective replacement indicator May 24, 2022 and she is here for generator change out.  Preoperative coronary angiography has demonstrated mild diffuse nonobstructive CAD.  Her aortic prosthesis has moderate paravalvular aortic insufficiency (most recently evaluated by transesophageal echo 06/13/2021 and CT angiography 07/09/2021 and invasive angiography performed 08/07/2021.  She has chronic lower extremity edema due to right heart failure and moderate to severe pulmonary artery hypertension not due to left heart failure (mean PA pressure 34 mmHg, transpulmonary gradient 21 mmHg, PVR 4.2 Wood units by right heart catheterization 08/07/2021).  In turn this is likely due to obstructive sleep apnea and severe obesity.  She has a very long AV conduction time (typical AV delay around 300 ms) and also has a chronic right bundle branch block.  She usually has roughly 80% atrial pacing and 80% ventricular  pacing and she has occasional episodes of very brief nonsustained VT.  Her most recent episode of atrial flutter was a 4-hour event that resolved spontaneously last August.  Additional medical problems include hypercholesterolemia, hypertension, COPD, diabetes mellitus type 2, treated hypothyroidism, GERD.  She is a full-time resident at [facility] assisted living.   Past Medical History:  Diagnosis Date   Anxiety    Aortic valve stenosis 10/10/2016   Arthritis    "fingers" (07/24/2017)   CHF (congestive heart failure) (HCC)    COPD (chronic obstructive pulmonary disease) (HCC)    Depression    Gallstones    Heart murmur    High cholesterol    History of kidney stones    Hypertension    Hypothyroidism    Migraines    "none in years til 01/2013-02/2013; none since"  (07/24/2017)   Orthopnoea    "just since open heart surgery" (02/25/2013)   Pacemaker    [employer]    Pneumonia ~ 2012   "walking pneumonia"   Pneumonia 07/23/2017   Stroke ([place]) 2001   denies residual on 02/25/2013   Symptomatic hypotension 10/10/2016   Syncope and collapse    "prior to open heart surgery" (02/25/2013)   Type II diabetes mellitus ([place])     Past Surgical History:  Procedure Laterality Date   AORTIC ARCH ANGIOGRAPHY N/A 08/07/2021   Procedure: AORTIC ARCH ANGIOGRAPHY;  Surgeon: Sherren Mocha, MD;  Location: [facility];  Service: Cardiovascular;  Laterality: N/A;   BREAST SURGERY Right    "clamped blood vessel and stitched it up; in [place]"   [place]  01/26/2013   "cow valve put in; widened  the second valve" (02/25/2013) - [place], [place]   CATARACT EXTRACTION W/ INTRAOCULAR LENS  IMPLANT, BILATERAL Bilateral 04/2017   CHOLECYSTECTOMY OPEN  1971   CYST EXCISION     "taken off my chest"   INSERT / REPLACE / REMOVE PACEMAKER  01/19/2013   [employer] - dual chamber   LAPAROSCOPIC GASTRIC SLEEVE RESECTION  2009   in [place]    RIGHT/LEFT HEART CATH AND CORONARY ANGIOGRAPHY N/A 08/07/2021   Procedure: RIGHT/LEFT HEART CATH AND CORONARY ANGIOGRAPHY;  Surgeon: Sherren Mocha, MD;  Location: [facility];  Service: Cardiovascular;  Laterality: N/A;   TEE WITHOUT CARDIOVERSION N/A 06/13/2021   Procedure: TRANSESOPHAGEAL ECHOCARDIOGRAM (TEE);  Surgeon: Sanda Klein, MD;  Location: [facility];  Service: Cardiovascular;  Laterality: N/A;   VEIN SURGERY Left    LLE; cut leg open and took out piece of vein that was protruding thru skin; stitched it up; done in [place]     Medications Prior to Admission: Prior to Admission medications   Medication Sig Start Date End Date Taking? Authorizing Provider  acetaminophen (TYLENOL) 650 MG CR tablet Take 650 mg by mouth every 8 (eight) hours as needed (Discomfort).   Yes [provider]  albuterol (PROVENTIL HFA;VENTOLIN HFA) 108 (90 BASE) MCG/ACT inhaler Inhale 2 puffs into the lungs every 6 (six) hours as needed (COPD). 12/08/13  Yes [provider]  albuterol (PROVENTIL) (2.5 MG/3ML) 0.083% nebulizer solution Take 2.5 mg by nebulization every 12 (twelve) hours as needed for wheezing or shortness of breath.   Yes [provider]  alendronate (FOSAMAX) 70 MG tablet Take 70 mg by mouth every Sunday. 07/13/17  Yes [provider]  atorvastatin (LIPITOR) 40 MG tablet Take 40 mg by mouth daily.   Yes [provider]  budesonide-formoterol (SYMBICORT) 160-4.5 MCG/ACT inhaler Inhale 2 puffs into the lungs 2 (two) times daily.   Yes [provider]  carvedilol (COREG) 6.25 MG tablet Take 1 tablet (6.25 mg total) by mouth 2 (two) times daily. 07/05/21  Yes Hilty, Nadean Corwin, MD  cloNIDine (CATAPRES) 0.2 MG tablet Take 1 tablet (0.2 mg total) by mouth at bedtime. 07/05/21  Yes Hilty, Nadean Corwin, MD  cyanocobalamin (VITAMIN B12) 500 MCG tablet Take 500 mcg by mouth daily.   Yes [provider]  dapagliflozin propanediol (FARXIGA) 10  MG TABS tablet Take 1 tablet (10 mg total) by mouth daily before breakfast. 06/22/21  Yes Ordell Prichett, MD  ELIQUIS 5 MG TABS tablet Take 1 tablet (5 mg total) by mouth 2 (two) times daily. 07/05/21  Yes Hilty, Nadean Corwin, MD  Ferrous Sulfate (IRON) 325 (65 Fe) MG TABS Take 325 mg by mouth daily. 08/13/17  Yes [provider]  FLUoxetine (PROZAC) 40 MG capsule Take 40 mg by mouth daily.    Yes [provider]  furosemide (LASIX) 40 MG tablet Take 40 mg by mouth daily at 12 noon.   Yes [provider]  gabapentin (NEURONTIN) 300 MG capsule Take 600 mg by mouth at bedtime.   Yes [provider]  glipiZIDE (GLUCOTROL) 10 MG tablet Take 10 mg by mouth 2 (two) times daily before a meal.   Yes [provider]  ipratropium (ATROVENT) 0.06 % nasal spray Place 2 sprays into both nostrils 3 (three) times daily. Patient taking differently: Place 2 sprays into both nostrils every 8 (eight) hours as needed (Allergies). 12/14/21  Yes Spero Geralds, MD  levocetirizine (XYZAL) 5 MG tablet Take 5 mg by  mouth at bedtime. 09/05/21  Yes [provider]  levothyroxine (SYNTHROID, LEVOTHROID) 50 MCG tablet Take 50 mcg by mouth daily before breakfast.   Yes [provider]  magnesium oxide (MAG-OX) 400 (240 Mg) MG tablet Take 400 mg by mouth daily.   Yes [provider]  olmesartan (BENICAR) 20 MG tablet Take 0.5 tablets (10 mg total) by mouth daily. Patient taking differently: Take 20 mg by mouth daily. 09/11/21  Yes Paylin Hailu, MD  omeprazole (PRILOSEC) 40 MG capsule Take 1 capsule (40 mg total) by mouth daily. 12/14/21  Yes Spero Geralds, MD  sodium chloride (OCEAN) 0.65 % SOLN nasal spray Place 2 sprays into both nostrils 2 (two) times daily.   Yes [provider]  temazepam (RESTORIL) 30 MG capsule Take 30 mg by mouth at bedtime. 07/07/17  Yes [provider]  traZODone (DESYREL) 50 MG tablet Take 25 mg by mouth at bedtime.  04/23/22  Yes [provider]  Vitamins A & D (VITAMIN A & D) ointment Apply 1 Application topically 3 (three) times daily. Additional every 8 hours for skin breakdown   Yes [provider]  amLODipine (NORVASC) 2.5 MG tablet Take 2.5 mg every night Patient not taking: Reported on 06/20/2022 07/05/21   Pixie Casino, MD  nystatin ointment (MYCOSTATIN) Apply 1 application  topically daily as needed (rash). 100,000- units/gram Patient not taking: Reported on 06/20/2022    [provider]     Allergies:   No Known Allergies  Social History:   Social History   Socioeconomic History   Marital status: Widowed    Spouse name: Not on file   Number of children: 2   Years of education: dental sch   Highest education level: Not on file  Occupational History   Occupation: retired  Tobacco Use   Smoking status: Former    Packs/day: 2.00    Years: 40.00    Total pack years: 80.00    Types: Cigarettes    Quit date: 05/29/1999    Years since quitting: 23.0   Smokeless tobacco: Never  Vaping Use   Vaping Use: Never used  Substance and Sexual Activity   Alcohol use: No   Drug use: No   Sexual activity: Yes  Other Topics Concern   Not on file  Social History Narrative   Not on file   Social Determinants of Health   Financial Resource Strain: Not on file  Food Insecurity: Not on file  Transportation Needs: Not on file  Physical Activity: Not on file  Stress: Not on file  Social Connections: Not on file  Intimate Partner Violence: Not on file    Family History:   The patient's family history includes Asthma in her brother; Breast cancer in her maternal aunt; Colon polyps in her brother and daughter; Diabetes in her maternal aunt and paternal aunt; Heart Problems in her father; Hypertension in her brother; Lung cancer in her mother; Skin cancer in her brother.    ROS:  Please see the history of present illness.  All other ROS reviewed and negative.      Physical Exam/Data:  There were no vitals filed for this visit. No intake or output data in the 24 hours ending 06/21/22 1438    02/08/2022    2:32 PM 12/15/2021   11:35 AM 12/14/2021   11:20 AM  Last 3 Weights  Weight (lbs) 213 lb 213 lb 213 lb 8 oz  Weight (kg) 96.616 kg 96.616 kg  96.843 kg     There is no height or weight on file to calculate BMI.  General:  Well nourished, well developed, in no acute distress.  Healthy left subclavian pacemaker site. HEENT: normal Neck: Hard to see JVD Vascular: No carotid bruits; Distal pulses 2+ bilaterally   Cardiac:  normal S1, S2; RRR; no diastolic murmur is heard, 2/6 early peaking aortic ejection murmur. Lungs:  clear to auscultation bilaterally, no wheezing, rhonchi or rales  Abd: soft, nontender, no hepatomegaly  Ext: 3+ bilateral calf edema, with a few small blisters forming in the right pretibial area. Musculoskeletal:  No deformities, BUE and BLE strength normal and equal Skin: warm and dry  Neuro:  CNs 2-12 intact, no focal abnormalities noted Psych:  Normal affect    EKG:  The ECG that was done on 01/31/2022 was personally reviewed and demonstrates this rhythm with very long PR interval and right bundle branch block  Relevant CV Studies: Pacemaker download 06/11/2022 shows pacemaker battery at elective replacement indicator, otherwise normal device function.  Laboratory Data:  High Sensitivity Troponin:  No results for input(s): "TROPONINIHS" in the last 720 hours.    ChemistryNo results for input(s): "NA", "K", "CL", "CO2", "GLUCOSE", "BUN", "CREATININE", "CALCIUM", "MG", "GFRNONAA", "GFRAA", "ANIONGAP" in the last 168 hours.  No results for input(s): "PROT", "ALBUMIN", "AST", "ALT", "ALKPHOS", "BILITOT" in the last 168 hours. Lipids No results for input(s): "CHOL", "TRIG", "HDL", "LABVLDL", "LDLCALC", "CHOLHDL" in the last 168 hours. HematologyNo results for input(s): "WBC", "RBC", "HGB", "HCT", "MCV", "MCH", "MCHC", "RDW",  "PLT" in the last 168 hours. Thyroid No results for input(s): "TSH", "FREET4" in the last 168 hours. BNPNo results for input(s): "BNP", "PROBNP" in the last 168 hours.  DDimer No results for input(s): "DDIMER" in the last 168 hours.   Radiology/Studies:  No results found.   Assessment and Plan:   Pacemaker battery depletion: She is not pacemaker dependent, but does require frequent atrial and ventricular pacing.  Increased risk of pacemaker infection due to diabetes mellitus and recent treatment with prednisone.  For both those reasons I think she would benefit from an antibiotic Aigis pouch.  Generator change of procedure discussed in detail. This procedure has been fully reviewed with the patient and written informed consent has been obtained. AFlutter: Currently appears to be in a paced-V paced rhythm.  Off Eliquis for a couple of doses in anticipation of the procedure today.  Plan to resume Eliquis this evening or tomorrow morning, based on the amount of bleeding at the time of surgery. RHF: Continues to have substantial lower extremity edema, actually right now without signs of weeping wounds, ulcerations or cellulitis.  Most recent available labs from 06/14/2022 showed creatinine of 1.68, improved compared to last September.  Varying renal function parameters based on intensity of diuretic use.    For questions or updates, please contact [place] Please consult [email or web address] for contact info under     Signed, Sanda Klein, MD  06/21/2022 2:38 PM

## 2022-06-22 ENCOUNTER — Other Ambulatory Visit (HOSPITAL_COMMUNITY): Payer: 59

## 2022-06-22 ENCOUNTER — Encounter (HOSPITAL_COMMUNITY)
Admission: AD | Disposition: A | Discharge status: Skilled Nursing Facility | Payer: Self-pay | Source: Home / Self Care | Attending: Cardiovascular Disease

## 2022-06-22 DIAGNOSIS — I495 Sick sinus syndrome: Secondary | ICD-10-CM

## 2022-06-22 DIAGNOSIS — Z45010 Encounter for checking and testing of cardiac pacemaker pulse generator [battery]: Secondary | ICD-10-CM

## 2022-06-22 HISTORY — PX: PPM GENERATOR CHANGEOUT: EP1233

## 2022-06-22 LAB — CBC
HCT: 38.7 % (ref 36.0–46.0)
Hemoglobin: 11.6 g/dL — ABNORMAL LOW (ref 12.0–15.0)
MCH: 29.2 pg (ref 26.0–34.0)
MCHC: 30 g/dL (ref 30.0–36.0)
MCV: 97.5 fL (ref 80.0–100.0)
Platelets: 167 10*3/uL (ref 150–400)
RBC: 3.97 MIL/uL (ref 3.87–5.11)
RDW: 14.7 % (ref 11.5–15.5)
WBC: 14.4 10*3/uL — ABNORMAL HIGH (ref 4.0–10.5)
nRBC: 0 % (ref 0.0–0.2)

## 2022-06-22 LAB — BASIC METABOLIC PANEL
Anion gap: 13 (ref 5–15)
BUN: 41 mg/dL — ABNORMAL HIGH (ref 8–23)
CO2: 26 mmol/L (ref 22–32)
Calcium: 8.5 mg/dL — ABNORMAL LOW (ref 8.9–10.3)
Chloride: 102 mmol/L (ref 98–111)
Creatinine, Ser: 1.73 mg/dL — ABNORMAL HIGH (ref 0.44–1.00)
GFR, Estimated: 30 mL/min — ABNORMAL LOW (ref >60)
Glucose, Bld: 168 mg/dL — ABNORMAL HIGH (ref 70–99)
Potassium: 4 mmol/L (ref 3.5–5.1)
Sodium: 141 mmol/L (ref 135–145)

## 2022-06-22 LAB — GLUCOSE, CAPILLARY
Glucose-Capillary: 124 mg/dL — ABNORMAL HIGH (ref 70–99)
Glucose-Capillary: 146 mg/dL — ABNORMAL HIGH (ref 70–99)
Glucose-Capillary: 169 mg/dL — ABNORMAL HIGH (ref 70–99)

## 2022-06-22 LAB — SURGICAL PCR SCREEN
MRSA, PCR: POSITIVE — AB
Staphylococcus aureus: POSITIVE — AB

## 2022-06-22 LAB — HEMOGLOBIN A1C
Hgb A1c MFr Bld: 7.3 % — ABNORMAL HIGH (ref 4.8–5.6)
Mean Plasma Glucose: 162.81 mg/dL

## 2022-06-22 SURGERY — PPM GENERATOR CHANGEOUT

## 2022-06-22 MED ORDER — CHLORHEXIDINE GLUCONATE 4 % EX LIQD
60.0000 mL | Freq: Once | CUTANEOUS | Status: AC
  Administered 2022-06-22: 4 via TOPICAL
  Filled 2022-06-22: qty 60

## 2022-06-22 MED ORDER — SODIUM CHLORIDE 0.9% FLUSH
3.0000 mL | Freq: Two times a day (BID) | INTRAVENOUS | Status: DC
  Administered 2022-06-23: 3 mL via INTRAVENOUS

## 2022-06-22 MED ORDER — SODIUM CHLORIDE 0.9 % IV SOLN
INTRAVENOUS | Status: AC
  Filled 2022-06-22: qty 2

## 2022-06-22 MED ORDER — MUPIROCIN 2 % EX OINT
1.0000 | TOPICAL_OINTMENT | Freq: Two times a day (BID) | CUTANEOUS | Status: DC
  Administered 2022-06-22 – 2022-06-23 (×2): 1 via NASAL
  Filled 2022-06-22 (×2): qty 22

## 2022-06-22 MED ORDER — VANCOMYCIN HCL IN DEXTROSE 1-5 GM/200ML-% IV SOLN
1000.0000 mg | Freq: Two times a day (BID) | INTRAVENOUS | Status: AC
  Administered 2022-06-22: 1000 mg via INTRAVENOUS
  Filled 2022-06-22: qty 200

## 2022-06-22 MED ORDER — SODIUM CHLORIDE 0.9 % IV SOLN: 250.0000 mL | INTRAVENOUS | Status: DC

## 2022-06-22 MED ORDER — CHLORHEXIDINE GLUCONATE CLOTH 2 % EX PADS: 6.0000 | MEDICATED_PAD | Freq: Every day | CUTANEOUS | Status: DC

## 2022-06-22 MED ORDER — LIDOCAINE HCL 1 % IJ SOLN
INTRAMUSCULAR | Status: AC
  Filled 2022-06-22: qty 60

## 2022-06-22 MED ORDER — INSULIN ASPART 100 UNIT/ML IJ SOLN
0.0000 [IU] | Freq: Three times a day (TID) | INTRAMUSCULAR | Status: DC
  Administered 2022-06-22: 2 [IU] via SUBCUTANEOUS
  Administered 2022-06-23: 3 [IU] via SUBCUTANEOUS

## 2022-06-22 MED ORDER — SODIUM CHLORIDE 0.9% FLUSH: 3.0000 mL | INTRAVENOUS | Status: DC | PRN

## 2022-06-22 MED ORDER — SODIUM CHLORIDE 0.9 % IV SOLN: INTRAVENOUS | Status: AC

## 2022-06-22 MED ORDER — LIDOCAINE HCL (PF) 1 % IJ SOLN
INTRAMUSCULAR | Status: DC | PRN
  Administered 2022-06-22: 50 mL

## 2022-06-22 MED ORDER — CHLORHEXIDINE GLUCONATE 4 % EX LIQD
60.0000 mL | Freq: Once | CUTANEOUS | Status: DC
  Filled 2022-06-22: qty 60

## 2022-06-22 MED ORDER — CEFAZOLIN SODIUM-DEXTROSE 2-4 GM/100ML-% IV SOLN
2.0000 g | INTRAVENOUS | Status: AC
  Administered 2022-06-22: 2 g via INTRAVENOUS

## 2022-06-22 MED ORDER — CEFAZOLIN SODIUM-DEXTROSE 2-4 GM/100ML-% IV SOLN
INTRAVENOUS | Status: AC
  Filled 2022-06-22: qty 100

## 2022-06-22 MED ORDER — SODIUM CHLORIDE 0.9 % IV SOLN
80.0000 mg | INTRAVENOUS | Status: AC
  Administered 2022-06-22: 80 mg

## 2022-06-22 MED FILL — Gentamicin Sulfate Inj 40 MG/ML: INTRAMUSCULAR | Qty: 80 | Status: AC

## 2022-06-22 MED FILL — Midazolam HCl Inj 2 MG/2ML (Base Equivalent): INTRAMUSCULAR | Qty: 2 | Status: AC

## 2022-06-22 SURGICAL SUPPLY — 6 items
CABLE SURGICAL S-101-97-12 (CABLE) ×1 IMPLANT
HEMOSTAT SURGICEL 2X4 FIBR (HEMOSTASIS) IMPLANT
MAT PREVALON FULL STRYKER (MISCELLANEOUS) IMPLANT
PACEMAKER ACCOLADE GR (Pacemaker) IMPLANT
PAD DEFIB RADIO PHYSIO CONN (PAD) ×1 IMPLANT
TRAY PACEMAKER INSERTION (PACKS) ×1 IMPLANT

## 2022-06-22 NOTE — Plan of Care (Signed)

## 2022-06-22 NOTE — Care Management (Signed)
  Transition of Care [facility]) Screening Note   Patient Details  Name: Tonya Glass Date of Birth: 06/19/1945   Transition of Care [facility] CM/SW Contact:    Bethena Roys, RN Phone Number: 06/22/2022, 10:12 AM    Transition of Care Department [facility]) has reviewed the patient. Patient presented for a pacemaker generator change-during procedure dyspnea worsened and patient was initiated on IV Lasix. PTA patient was from [facility] in [place]. Patient states she has durable medical equipment: Transport planner and rollator. Patient has support of her daughter and she states she will not need any home health services at this time. We will continue to monitor patient advancement through interdisciplinary progression rounds. If new patient transition needs arise, please place a TOC consult.

## 2022-06-22 NOTE — Discharge Instructions (Addendum)
Post procedure wound care instructions (pacemaker) Keep incision clean and dry for 10 days. No driving for 2 days.  You can remove large outer PLASTIC dressing on  06/23/22 if not removed at the hospital. Leave steri-strips (little pieces of tape) on until seen in the office for wound check appointment. Call the office [phone]) for redness, drainage, swelling, or fever.

## 2022-06-22 NOTE — TOC Initial Note (Signed)
Transition of Care [facility]) - Initial/Assessment Note    Patient Details  Name: Tonya Glass MRN: [id number] Date of Birth: March 18, 1946  Transition of Care [facility]) CM/SW Contact:    Milas Gain, [place] Phone Number: 06/22/2022, 4:32 PM  Clinical Narrative:                   CSW spoke with patient regarding dc plans when patient is medically ready for dc. Patient reports she is from [facility]. Patient confirms her plan is to return when medically ready for dc. All questions answered. No further questions reported at this time. CSW contacted [facility] who confirmed they can accept patient back when medically ready.Please contact [phone]. Anon Raices if patient discharges over the weekend.   Expected Discharge Plan: Assisted Living Barriers to Discharge: Continued Medical Work up   Patient Goals and CMS Choice Patient states their goals for this hospitalization and ongoing recovery are:: to return to [facility]   Choice offered to / list presented to : Patient      Expected Discharge Plan and Services In-house Referral: Clinical Social Work     Living arrangements for the past 2 months: [place]                                      Prior Living Arrangements/Services Living arrangements for the past 2 months: [place] Lives with:: Facility Resident Patient language and need for interpreter reviewed:: Yes        Need for Family Participation in Patient Care: Yes (Comment) Care giver support system in place?: Yes (comment)   Criminal Activity/Legal Involvement Pertinent to Current Situation/Hospitalization: No - Comment as needed  Activities of Daily Living      Permission Sought/Granted      Share Information with NAME: Beth  Permission granted to share info w AGENCY: ALF  Permission granted to share info w Relationship: daughter  Permission granted to share info w Contact Information:  Eustaquio Maize [phone]  Emotional Assessment   Attitude/Demeanor/Rapport: Gracious Affect (typically observed): Calm Orientation: : Oriented to Self, Oriented to Place, Oriented to  Time, Oriented to Situation (WDL) Alcohol / Substance Use: Not Applicable Psych Involvement: No (comment)  Admission diagnosis:  CHF (congestive heart failure) (HCC) [I50.9] Patient Active Problem List   Diagnosis Date Noted   Cor pulmonale, chronic ([place]) 09/12/2021   PAH (pulmonary artery hypertension) ([place]) 09/12/2021   Peripheral venous insufficiency 09/12/2021   Stage 3b chronic kidney disease ([place]) 09/12/2021   Mixed simple and mucopurulent chronic bronchitis ([place]) 09/12/2021   Paravalvular leak (prosthetic valve) 08/07/2021   Nonrheumatic mitral valve regurgitation    Venous stasis ulcers of both lower extremities ([place]) 04/13/2021   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   Atrial flutter with controlled response ([place]) 04/13/2021   Thoracic aortic aneurysm without rupture ([place]) 03/19/2018   Nonrheumatic aortic (valve) insufficiency 03/19/2018   Acute CHF (congestive heart failure) ([place]) [id number]   Acute diastolic CHF (congestive heart failure) ([place]) 08/18/2017   CHF (congestive heart failure) ([place]) 08/18/2017   Hypothyroidism 07/23/2017   Acute respiratory failure with hypoxia ([place]) 07/23/2017   Pneumonia 07/23/2017   Aortic valve stenosis [id number]   Diastolic murmur Q000111Q   SSS (sick sinus syndrome) ([place]) 03/20/2016   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   Atherosclerosis of native coronary artery of native  heart without angina pectoris 09/06/2014   Pacemaker 05/28/2013   Essential hypertension 05/28/2013   Dyslipidemia 05/28/2013   S/P aortic valve replacement with bioprosthetic valve 05/28/2013   DM2 (diabetes mellitus, type 2) ([place]) 05/28/2013   Chronic heart failure ([place]) 05/28/2013   PCP:  Roetta Sessions,  NP Pharmacy:   [facility] [place] - [address] [place] [address] Phone: [phone] Fax: [phone]     Social Determinants of Health (SDOH) Social History: SDOH Screenings   Tobacco Use: Medium Risk (04/17/2022)   SDOH Interventions:     Readmission Risk Interventions     No data to display

## 2022-06-22 NOTE — Progress Notes (Signed)
Resume Apixaban  on Monday am

## 2022-06-22 NOTE — Progress Notes (Addendum)
Rounding Note    Patient Name: Tonya Glass Date of Encounter: 06/22/2022  [place] Cardiologist: Pixie Casino, MD   Subjective   Patient reports significant improvement in breathing following diuresis. Denies chest pain, palpitations, dyspnea. Continues to have notable bilateral lower extremity edema, cites lack of compression devices that she uses at home.   Inpatient Medications    Scheduled Meds:  atorvastatin  40 mg Oral Daily   cloNIDine  0.2 mg Oral QHS   dapagliflozin propanediol  10 mg Oral QAC breakfast   FLUoxetine  40 mg Oral Daily   fluticasone furoate-vilanterol  1 puff Inhalation Daily   furosemide  40 mg Intravenous BID   gabapentin  600 mg Oral QHS   heparin  5,000 Units Subcutaneous Q8H   ipratropium  2 spray Each Nare TID   irbesartan  150 mg Oral Daily   levothyroxine  50 mcg Oral QAC breakfast   loratadine  10 mg Oral QHS   magnesium oxide  400 mg Oral Daily   pantoprazole  40 mg Oral Daily   sodium chloride flush  3 mL Intravenous Q12H   traZODone  25 mg Oral QHS   Continuous Infusions:  PRN Meds: acetaminophen, albuterol   Vital Signs    Vitals:   06/22/22 0111 06/22/22 0407 06/22/22 0752 06/22/22 0834  BP: (!) 112/56 113/60 (!) 121/56   Pulse:  63 60 60  Resp: 15 16 16 16  $ Temp:  98.5 F (36.9 C) 97.9 F (36.6 C)   TempSrc:  Oral Oral   SpO2:  99% 98% 97%    Intake/Output Summary (Last 24 hours) at 06/22/2022 1015 Last data filed at 06/22/2022 0413 Gross per 24 hour  Intake 240 ml  Output 300 ml  Net -60 ml      02/08/2022    2:32 PM 12/15/2021   11:35 AM 12/14/2021   11:20 AM  Last 3 Weights  Weight (lbs) 213 lb 213 lb 213 lb 8 oz  Weight (kg) 96.616 kg 96.616 kg 96.843 kg      Telemetry    A paced/v paced rhythm - Personally Reviewed  ECG    A paced/v paced rhythm - Personally Reviewed  Physical Exam   GEN: No acute distress.   Neck: mild elevation of JVP Cardiac: RRR, LUSB systolic murmur noted,  2/6. Respiratory: Bilateral lower lobe crackles.  GI: Soft, nontender, non-distended  MS: 3+ bilateral lower extremity edema with edema bullae noted, R>L Neuro:  Nonfocal  Psych: Normal affect   Labs    High Sensitivity Troponin:  No results for input(s): "TROPONINIHS" in the last 720 hours.   Chemistry Recent Labs  Lab 06/21/22 1904 06/22/22 0221  NA  --  141  K  --  4.0  CL  --  102  CO2  --  26  GLUCOSE  --  168*  BUN  --  41*  CREATININE 1.73* 1.73*  CALCIUM  --  8.5*  MG 2.5*  --   GFRNONAA 30* 30*  ANIONGAP  --  13    Lipids No results for input(s): "CHOL", "TRIG", "HDL", "LABVLDL", "LDLCALC", "CHOLHDL" in the last 168 hours.  Hematology Recent Labs  Lab 06/21/22 1904 06/22/22 0221  WBC 14.0* 14.4*  RBC 4.25 3.97  HGB 12.6 11.6*  HCT 41.1 38.7  MCV 96.7 97.5  MCH 29.6 29.2  MCHC 30.7 30.0  RDW 14.6 14.7  PLT 203 167   Thyroid No results for input(s): "TSH", "FREET4"  in the last 168 hours.  BNP Recent Labs  Lab 06/21/22 1904  BNP 517.5*    DDimer No results for input(s): "DDIMER" in the last 168 hours.   Radiology    ABORTED INVASIVE LAB PROCEDURE  Result Date: 06/22/2022 This case was aborted.  DG Chest 1 View  Result Date: 06/21/2022 CLINICAL DATA:  Shortness of breath EXAM: CHEST  1 VIEW COMPARISON:  02/22/2022 FINDINGS: Hyperinflation. Cardiomegaly. Prior CABG. Left pacer remains in place, unchanged. No confluent airspace opacities, effusions or edema. IMPRESSION: Cardiomegaly, hyperinflation. No acute cardiopulmonary disease. Electronically Signed   By: Rolm Baptise M.D.   On: 06/21/2022 19:12    Cardiac Studies   05/02/21 TTE  IMPRESSIONS     1. Left ventricular ejection fraction, by estimation, is 60 to 65%. The  left ventricle has normal function. The left ventricle has no regional  wall motion abnormalities. Left ventricular diastolic parameters are  consistent with Grade II diastolic  dysfunction (pseudonormalization).   2. Right  ventricular systolic function is normal. The right ventricular  size is mildly enlarged. There is moderately elevated pulmonary artery  systolic pressure. The estimated right ventricular systolic pressure is  0000000 mmHg.   3. Left atrial size was mildly dilated.   4. Right atrial size was mildly dilated.   5. Restricted posterior leaflet. Splay artifact (consider TEE for further  evaluation). The mitral valve is degenerative. Moderate to severe mitral  valve regurgitation. No evidence of mitral stenosis. Moderate mitral  annular calcification.   6. Tricuspid valve regurgitation is moderate to severe.   7. The aortic valve has been repaired/replaced. Aortic valve  regurgitation is moderate to severe. No aortic stenosis is present. There  is a 23 mm Perimount Magna Ease valve present in the aortic position.  Procedure Date: 01/26/13. Echo findings are  consistent with normal structure and function of the aortic valve  prosthesis. Aortic regurgitation PHT measures 317 msec. Aortic valve mean  gradient measures 21.0 mmHg. Aortic valve Vmax measures 3.01 m/s.   8. The inferior vena cava is normal in size with greater than 50%  respiratory variability, suggesting right atrial pressure of 3 mmHg.   Comparison(s): Prior images reviewed side by side. 05/27/19 EF 55-60% AV  90mHg mean PG, 362mg peak PG. Mild AI.   FINDINGS   Left Ventricle: Left ventricular ejection fraction, by estimation, is 60  to 65%. The left ventricle has normal function. The left ventricle has no  regional wall motion abnormalities. The left ventricular internal cavity  size was normal in size. There is   no left ventricular hypertrophy. Left ventricular diastolic parameters  are consistent with Grade II diastolic dysfunction (pseudonormalization).   Right Ventricle: The right ventricular size is mildly enlarged. No  increase in right ventricular wall thickness. Right ventricular systolic  function is normal. There is  moderately elevated pulmonary artery systolic  pressure. The tricuspid regurgitant  velocity is 3.21 m/s, and with an assumed right atrial pressure of 8 mmHg,  the estimated right ventricular systolic pressure is 490000000mHg.   Left Atrium: Left atrial size was mildly dilated.   Right Atrium: Right atrial size was mildly dilated.   Pericardium: There is no evidence of pericardial effusion.   Mitral Valve: Restricted posterior leaflet. Splay artifact (consider TEE  for further evaluation). The mitral valve is degenerative in appearance.  There is mild thickening of the mitral valve leaflet(s). There is mild  calcification of the mitral valve  leaflet(s). Moderate mitral annular calcification. Moderate  to severe  mitral valve regurgitation, with eccentric anteriorly directed jet. No  evidence of mitral valve stenosis.   Tricuspid Valve: The tricuspid valve is normal in structure. Tricuspid  valve regurgitation is moderate to severe. No evidence of tricuspid  stenosis.   Aortic Valve: The aortic valve has been repaired/replaced. Aortic valve  regurgitation is moderate to severe. Aortic regurgitation PHT measures 317  msec. No aortic stenosis is present. Aortic valve mean gradient measures  21.0 mmHg. Aortic valve peak  gradient measures 36.2 mmHg. There is a 23 mm Perimount Magna Ease valve  present in the aortic position. Procedure Date: 01/26/13. Echo findings are  consistent with normal structure and function of the aortic valve  prosthesis.   Pulmonic Valve: The pulmonic valve was normal in structure. Pulmonic valve  regurgitation is trivial. No evidence of pulmonic stenosis.   Aorta: The aortic root is normal in size and structure.   Venous: The inferior vena cava is normal in size with greater than 50%  respiratory variability, suggesting right atrial pressure of 3 mmHg.   IAS/Shunts: No atrial level shunt detected by color flow Doppler.   Additional Comments: A device lead  is visualized in the right ventricle  and right atrium.   08/07/21 LHC/RHC  1.  Patent coronary arteries with mild diffuse nonobstructive irregularity throughout.  There are no high-grade stenoses present. 2.  3+ aortic insufficiency based on angiographic assessment  3.  Unable to direct a straight tip wire from the paravalvular space into the left ventricle 4.  Normal wedge pressure and LVEDP of 13 mmHg and 16 mmHg, respectively 5.  Moderate pulmonary hypertension with mean PA pressure 34 mmHg, transpulmonary gradient 21 mmHg, PVR 4.2 Wood units  Patient Profile     77 y.o. female with history of aortic valve replacement, sick sinus syndrome status post pacemaker, diastolic congestive heart failure, COPD, difficult to control hypertension, dyslipidemia and type 2 diabetes.   Assessment & Plan    Acute on chronic diastolic CHF  Patient was to undergo device upgrade yesterday with her device ERI. Unfortunately she became acutely hypoxic with O2 saturation as low as 55% while being prepped for this procedure. Physical exam at that time consistent with acute CHF/flash pulmonary edema. Following IV diuresis, patient now with improved respiratory status, no longer hypoxic. Continues to have significant lower extremity edema with bullae.   Continue IV lasix 75m BID today to optimize volume status for gen change HF GDMT: Olmesartan 236m(receiving dose equivalent Telmisartan) Farxiga 1048mot currently taking spironolactone with CKD IIIb. At this time, would think that risks>benefits Coreg held yesterday due to acute CHF. Will plan to resume this evening if BP stabilizes Plan to repeat TTE this admission  Hypertension  Patient with labile BP this admission. Hypertensive yesterday with acute CHF, now normal to low normal despite home Coreg and amlodipine being held. Has continued to receive irbesartan (at dose equivalent to home Olmesartan).   Continue to monitor vital signs following gen  change today. May need adjustments to home BP regimen.    Hx paroxysmal atrial flutter/atrial fibrillation  Patient first noted to be in atrial flutter March 2021, successfully over-paced out of this rhythm. Recurrence on February 2022, this time with spontaneous conversion. 4 hour episode of atrial flutter noted 01/05/22  A paced V paced today Eliquis currently held for gen change, resume per EP guidance  DM type II  Sliding scale insulin while admitted.  Hypothyroidism  Continue Synthroid 103m87m  GERD  Continue home Omeprazole  For questions or updates, please contact [place] Please consult [email or web address] for contact info under        Signed, Lily Kocher, PA-C  06/22/2022, 10:15 AM    CHF acute diastolic mostly resolved  RH failure, Cor Pulmonale  Pacemaker [employer] at [employer]  Atrial fib/flutter persistent   Decubitus ulcer   Volume and respiratory status better, she says breathing better than her baseline  Have reviewed with Dr Thermon Leyland and will proceed with gen change today  We have reviewed the benefits and risks of generator replacement.  These include but are not limited to lead fracture and infection.  The patient understands, agrees and is willing to proceed.    Has decubitus, but no systemic signs of infection

## 2022-06-22 NOTE — Consult Note (Signed)
WOC Nurse Consult Note: Reason for Consult: pressure injury/MASD Wound type: Stage 2 Pressure injury Pressure Injury POA: Yes Measurement:see nursing flow sheets Wound bed: clean, pink Drainage (amount, consistency, odor) none per nursing flow sheet Periwound: intact  Dressing procedure/placement/frequency: Continue silicone foam per nursing skin care order set. Assess for acute changes EVERY SHIFT Turn and reposition at least every 2 hours.  Nursing did not feel intertriginous skin needed antimicrobial wicking fabric at this time.  Purewick in place for management of incontinence and moisture control.   Re consult if needed, will not follow at this time. Thanks  Chinonso Linker [employer], RN,CWOCN, CNS, [place] [phone])

## 2022-06-23 ENCOUNTER — Other Ambulatory Visit (HOSPITAL_COMMUNITY): Payer: 59

## 2022-06-23 DIAGNOSIS — I5032 Chronic diastolic (congestive) heart failure: Secondary | ICD-10-CM

## 2022-06-23 DIAGNOSIS — L8990 Pressure ulcer of unspecified site, unspecified stage: Secondary | ICD-10-CM | POA: Insufficient documentation

## 2022-06-23 LAB — GLUCOSE, CAPILLARY
Glucose-Capillary: 153 mg/dL — ABNORMAL HIGH (ref 70–99)
Glucose-Capillary: 161 mg/dL — ABNORMAL HIGH (ref 70–99)

## 2022-06-23 MED ORDER — CARVEDILOL 3.125 MG PO TABS: 3.1250 mg | ORAL_TABLET | Freq: Two times a day (BID) | ORAL | Status: DC

## 2022-06-23 MED ORDER — APIXABAN 5 MG PO TABS: 5.0000 mg | ORAL_TABLET | Freq: Two times a day (BID) | ORAL | 1 refills | Status: DC

## 2022-06-23 NOTE — TOC Transition Note (Signed)
Transition of Care [facility]) - CM/SW Discharge Note   Patient Details  Name: Tonya Glass MRN: [id number] Date of Birth: Apr 13, 1946  [facility]) CM/SW Contact:  Amador Cunas, [place] Phone Number: 06/23/2022, 2:15 PM   Clinical Narrative:   Pt for dc back to [facility]. Spoke to [name] at [place] who confirmed they are prepared for pt to admit. Pt's dtr aware of dc and will provide transport. RN provided with number for report. DC summary faxed to [facility] [phone]. SW signing off at dc.   [name], MSW, LCSW [phone] (coverage)      Final next level of care: Assisted Living Barriers to Discharge: No Barriers Identified   Patient Goals and CMS Choice   Choice offered to / list presented to : Patient  Discharge Placement                  Patient to be transferred to facility by: [name] Name of family member notified: [name] Patient and family notified of of transfer: 06/23/22  Discharge Plan and Services Additional resources added to the After Visit Summary for   In-house Referral: Clinical Social Work                                   Social Determinants of Health ([place]) Interventions SDOH Screenings   Food Insecurity: No Food Insecurity (06/22/2022)  Housing: Low Risk  (06/22/2022)  Transportation Needs: No Transportation Needs (06/22/2022)  Utilities: Not At Risk (06/22/2022)  Tobacco Use: Medium Risk (04/17/2022)     Readmission Risk Interventions     No data to display

## 2022-06-23 NOTE — Discharge Summary (Signed)
Discharge Summary    Patient ID: Tonya Glass MRN: [id number]; DOB: May 13, 1946  Admit date: 06/21/2022 Discharge date: 06/23/2022  PCP:  Roetta Sessions, NP   [place] [facility] Cardiologist:  Pixie Casino, MD     Discharge Diagnoses    Principal Problem:   CHF (congestive heart failure) [facility]) Active Problems:   Pacemaker   Essential hypertension   DM2 (diabetes mellitus, type 2) ([place])   SSS (sick sinus syndrome) ([place])   Pressure injury of skin    Diagnostic Studies/Procedures    PPM Generator Changeout 06/22/22 _____________   History of Present Illness     Tonya Glass is a 77 y.o. 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  She has subsequently developed intermittent paroxysmal atrial flutter with successful overdrive pacing in [id number] and spontaneous resolution in January 2022.     Her pacemaker reached elective replacement indicator May 24, 2022 and she presented on 2/8 for [facility] generator changeout.    Preoperative coronary angiography 08/07/21 demonstrated mild diffuse nonobstructive CAD.  Her aortic prosthesis has moderate paravalvular aortic insufficiency (most recently evaluated by transesophageal echo 06/13/2021 and CT angiography 07/09/2021 and invasive angiography performed 08/07/2021.  She has chronic lower extremity edema due to right heart failure and moderate to severe pulmonary artery hypertension not due to left heart failure (mean PA pressure 34 mmHg, transpulmonary gradient 21 mmHg, PVR 4.2 Wood units by right heart catheterization 08/07/2021).  In turn this is likely due to obstructive sleep apnea and severe obesity.   She has a very long  AV conduction time (typical AV delay around 300 ms) and also has a chronic right bundle branch block.  She usually has roughly 80% atrial pacing and 80% ventricular pacing and she has occasional episodes of very brief nonsustained VT.  Her most recent episode of atrial flutter was a 4-hour event that resolved spontaneously last August.   Additional medical problems include hypercholesterolemia, hypertension, COPD, diabetes mellitus type 2, treated hypothyroidism, GERD.   She is a full-time resident at [facility] assisted living.  Hospital Course     Consultants: Wound Care RN   Pacemaker Battery Depletion  SSS - Per Dr. Sallyanne Kuster, patient is not pacemaker dependent but does require frequent atrial and ventricular pacing. Her PPM reached elective repair indicator on 05/24/22 - Patient presented on 2/8 for PPM generator changeout on 2/8 with Dr. Sallyanne Kuster. However, when patient was brought to the cath lab, she developed severe dyspnea while supine. Procedure was aborted and patient was admitted for acute CHF exacerbation  - After IV diuresis, patient's respiratory status improved and patient underwent PPM generator changeout on 2/9 - Site examined by Dr. Margaretann Loveless prior to DC and was stable. Wound care instructions provided on AVS   Acute on Chronic Diastolic CHF - While being prepped for PPM generator changeout on 2/8, patient became acutely hypoxic with O2 saturation dropping as low at 55%. Patient was treated with IV diuretics. Felt much better and was asking to go home on 2/10  - Resume home PO lasix 40 mg daily at discharge  - Continue farxiga 10 mg daily, carvedilol 3.125 mg BID, olmesartan 20 mg daily   HTN  - Continue home regiment at DC  - Decreased carvedilol  to 3.125 mg BID due to low-normal BP prior to DC   Paroxysmal atrial flutter/fib - Per EP-- resume eliquis on 06/25/22 - Continue carvedilol 3.125 mg BID   Decubitus Ulcer  - Patient was evaluated the the wound care nurse while  admitted-- patient had a stage 2 pressure injury on arrival. No signs of infection   Type II DM  - Patient was on SSI while admitted - Resume home medications at DC   Patient was seen and examined by Dr. Margaretann Loveless and was deemed stable for discharge   Patient has a device clinic appointment on 07/04/22 and a general cardiology follow up appointment on 07/12/22   Did the patient have an acute coronary syndrome (MI, NSTEMI, STEMI, etc) this admission?:  No                               Did the patient have a percutaneous coronary intervention (stent / angioplasty)?:  No.        _____________  Discharge Vitals Blood pressure (!) 121/56, pulse 68, temperature 97.7 F (36.5 C), temperature source Oral, resp. rate 18, height 5' (1.524 m), weight 95.1 kg, SpO2 94 %.  Filed Weights   06/22/22 1000  Weight: 95.1 kg    Labs & Radiologic Studies    CBC Recent Labs    06/21/22 1904 06/22/22 0221  WBC 14.0* 14.4*  HGB 12.6 11.6*  HCT 41.1 38.7  MCV 96.7 97.5  PLT 203 [id number]   Basic Metabolic Panel Recent Labs    06/21/22 1904 06/22/22 0221  NA  --  141  K  --  4.0  CL  --  102  CO2  --  26  GLUCOSE  --  168*  BUN  --  41*  CREATININE 1.73* 1.73*  CALCIUM  --  8.5*  MG 2.5*  --    Liver Function Tests No results for input(s): "AST", "ALT", "ALKPHOS", "BILITOT", "PROT", "ALBUMIN" in the last 72 hours. No results for input(s): "LIPASE", "AMYLASE" in the last 72 hours. High Sensitivity Troponin:   No results for input(s): "TROPONINIHS" in the last 720 hours.  BNP Invalid input(s): "POCBNP" D-Dimer No results for input(s): "DDIMER" in the last 72 hours. Hemoglobin A1C Recent Labs    06/22/22 0221  HGBA1C 7.3*   Fasting Lipid Panel No results for input(s): "CHOL", "HDL", "LDLCALC", "TRIG", "CHOLHDL", "LDLDIRECT" in the last 72 hours. Thyroid Function Tests No results for input(s): "TSH", "T4TOTAL", "T3FREE", "THYROIDAB" in the last 72 hours.  Invalid input(s):  "FREET3" _____________  [facility] INVASIVE LAB PROCEDURE  Result Date: 06/22/2022 This case was aborted.  DG Chest 1 View  Result Date: 06/21/2022 CLINICAL DATA:  Shortness of breath EXAM: CHEST  1 VIEW COMPARISON:  02/22/2022 FINDINGS: Hyperinflation. Cardiomegaly. Prior CABG. Left pacer remains in place, unchanged. No confluent airspace opacities, effusions or edema. IMPRESSION: Cardiomegaly, hyperinflation. No acute cardiopulmonary disease. Electronically Signed   By: Rolm Baptise M.D.   On: 06/21/2022 19:12   CUP PACEART REMOTE DEVICE CHECK  Result Date: 06/12/2022 Scheduled remote reviewed. Normal device function.  ERI triggered 05/24/22, replacement scheduled 06/21/22 Next remote 31 days- JJB  Disposition   Pt is being discharged home today in good condition.  Follow-up Plans & Appointments     Follow-up [place] [facility] Follow up on 07/04/2022.   Specialty: Cardiology Why: Appointment at 8:40 AM Contact information: [id number] N  [address], [place] [id number] Narka Bladen [phone]        Ledora Bottcher, PA Follow up on 07/12/2022.   Specialties: Cardiology, Radiology Why: Appointment at 8:25 AM Contact information: [address] [place] [address] [phone]                Discharge Instructions     Diet - low sodium heart healthy   Complete by: As directed    Discharge instructions   Complete by: As directed    Resume Eliquis on 06/25/22   Discharge wound care:   Complete by: As directed    Keep all wounds clean and dry. Follow up with PCP about pressure ulcer on sacrum within 1 week of discharge   Increase activity slowly   Complete by: As directed         Discharge Medications   Allergies as of 06/23/2022   No Known Allergies      Medication List     STOP taking these medications    amLODipine 2.5 MG tablet Commonly known as: NORVASC       TAKE  these medications    acetaminophen 650 MG CR tablet Commonly known as: TYLENOL Take 650 mg by mouth every 8 (eight) hours as needed (Discomfort).   albuterol (2.5 MG/3ML) 0.083% nebulizer solution Commonly known as: PROVENTIL Take 2.5 mg by nebulization every 12 (twelve) hours as needed for wheezing or shortness of breath.   albuterol 108 (90 Base) MCG/ACT inhaler Commonly known as: VENTOLIN HFA Inhale 2 puffs into the lungs every 6 (six) hours as needed (COPD).   alendronate 70 MG tablet Commonly known as: FOSAMAX Take 70 mg by mouth every Sunday.   apixaban 5 MG Tabs tablet Commonly known as: Eliquis Take 1 tablet (5 mg total) by mouth 2 (two) times daily. Start taking on: June 25, 2022 What changed: These instructions start on June 25, 2022. If you are unsure what to do until then, ask your doctor or other care provider.   atorvastatin 40 MG tablet Commonly known as: LIPITOR Take 40 mg by mouth daily.   budesonide-formoterol 160-4.5 MCG/ACT inhaler Commonly known as: SYMBICORT Inhale 2 puffs into the lungs 2 (two) times daily.   carvedilol 3.125 MG tablet Commonly known as: COREG Take 1 tablet (3.125 mg total) by mouth 2 (two) times daily. What changed:  medication strength how much to take   cloNIDine 0.2 MG tablet Commonly known as: CATAPRES Take 1 tablet (0.2 mg total) by mouth at bedtime.   cyanocobalamin 500 MCG tablet Commonly known as: VITAMIN B12 Take 500 mcg by mouth daily.   dapagliflozin propanediol 10 MG Tabs tablet Commonly known as: Farxiga Take 1 tablet (10 mg total) by mouth daily before breakfast.   FLUoxetine 40 MG capsule Commonly known as: PROZAC Take 40 mg by mouth daily.   furosemide 40 MG tablet Commonly known as: LASIX Take 40 mg by mouth daily at 12 noon.   gabapentin 300 MG capsule Commonly known as: NEURONTIN Take 600 mg by mouth at bedtime.   glipiZIDE 10 MG tablet Commonly known as: GLUCOTROL Take 10 mg by mouth  2 (two) times daily before a meal.   ipratropium 0.06 % nasal spray Commonly known as: ATROVENT Place 2 sprays into both nostrils 3 (three) times daily. What changed:  when to take this reasons to take this   Iron 325 (65 Fe) MG Tabs Take 325 mg by mouth daily.   levocetirizine  5 MG tablet Commonly known as: XYZAL Take 5 mg by mouth at bedtime.   levothyroxine 50 MCG tablet Commonly known as: SYNTHROID Take 50 mcg by mouth daily before breakfast.   magnesium oxide 400 (240 Mg) MG tablet Commonly known as: MAG-OX Take 400 mg by mouth daily.   nystatin ointment Commonly known as: MYCOSTATIN Apply 1 application  topically daily as needed (rash). 100,000- units/gram   olmesartan 20 MG tablet Commonly known as: BENICAR Take 0.5 tablets (10 mg total) by mouth daily. What changed: how much to take   omeprazole 40 MG capsule Commonly known as: PRILOSEC Take 1 capsule (40 mg total) by mouth daily.   sodium chloride 0.65 % Soln nasal spray Commonly known as: OCEAN Place 2 sprays into both nostrils 2 (two) times daily.   temazepam 30 MG capsule Commonly known as: RESTORIL Take 30 mg by mouth at bedtime.   traZODone 50 MG tablet Commonly known as: DESYREL Take 25 mg by mouth at bedtime.   vitamin A & D ointment Apply 1 Application topically 3 (three) times daily. Additional every 8 hours for skin breakdown               Discharge Care Instructions  (From admission, onward)           Start     Ordered   06/23/22 0000  Discharge wound care:       Comments: Keep all wounds clean and dry. Follow up with PCP about pressure ulcer on sacrum within 1 week of discharge   06/23/22 1410               Outstanding Labs/Studies     Duration of Discharge Encounter   Greater than 30 minutes including physician time.  Signed, Margie Billet, PA-C 06/23/2022, 2:11 PM

## 2022-06-23 NOTE — Progress Notes (Signed)
Rounding Note    Patient Name: Tonya Glass Date of Encounter: 06/23/2022  [place] Cardiologist: Pixie Casino, MD   Subjective   Feeling much better and wants to go home. Significant UOP despite no documentation.   Inpatient Medications    Scheduled Meds:  atorvastatin  40 mg Oral Daily   Chlorhexidine Gluconate Cloth  6 each Topical Q0600   cloNIDine  0.2 mg Oral QHS   dapagliflozin propanediol  10 mg Oral QAC breakfast   FLUoxetine  40 mg Oral Daily   fluticasone furoate-vilanterol  1 puff Inhalation Daily   furosemide  40 mg Intravenous BID   gabapentin  600 mg Oral QHS   heparin  5,000 Units Subcutaneous Q8H   insulin aspart  0-15 Units Subcutaneous TID WC   ipratropium  2 spray Each Nare TID   irbesartan  150 mg Oral Daily   levothyroxine  50 mcg Oral QAC breakfast   loratadine  10 mg Oral QHS   mupirocin ointment  1 Application Nasal BID   pantoprazole  40 mg Oral Daily   sodium chloride flush  3 mL Intravenous Q12H   sodium chloride flush  3 mL Intravenous Q12H   traZODone  25 mg Oral QHS   Continuous Infusions:  PRN Meds: acetaminophen, albuterol   Vital Signs    Vitals:   06/22/22 2327 06/23/22 0347 06/23/22 0754 06/23/22 0808  BP: (!) 111/53 (!) 104/51 (!) 113/52 (!) 121/56  Pulse: 67 65 60 68  Resp: 15 19 18 18  $ Temp: 97.7 F (36.5 C) 98.1 F (36.7 C) 98 F (36.7 C) 97.7 F (36.5 C)  TempSrc: Oral Oral Oral Oral  SpO2: 90% 95%  94%  Weight:      Height:        Intake/Output Summary (Last 24 hours) at 06/23/2022 1114 Last data filed at 06/23/2022 0348 Gross per 24 hour  Intake 1186.95 ml  Output 300 ml  Net 886.95 ml      06/22/2022   10:00 AM 02/08/2022    2:32 PM 12/15/2021   11:35 AM  Last 3 Weights  Weight (lbs) 209 lb 10.5 oz 213 lb 213 lb  Weight (kg) 95.1 kg 96.616 kg 96.616 kg      Telemetry    A paced/v paced rhythm - Personally Reviewed  ECG    A paced/v paced rhythm - Personally Reviewed  Physical  Exam   GEN: No acute distress.   Neck: no JVD Cardiac: RRR, 2/6 SM. L chest wall pacemaker site dressed with sanguinous drainage on dressing. Respiratory: Bilateral lower lobe crackles.  GI: Soft, nontender, non-distended  MS: 3+ bilateral lower extremity edema with edema bullae noted, R>L Neuro:  Nonfocal  Psych: Normal affect   Labs    High Sensitivity Troponin:  No results for input(s): "TROPONINIHS" in the last 720 hours.   Chemistry Recent Labs  Lab 06/21/22 1904 06/22/22 0221  NA  --  141  K  --  4.0  CL  --  102  CO2  --  26  GLUCOSE  --  168*  BUN  --  41*  CREATININE 1.73* 1.73*  CALCIUM  --  8.5*  MG 2.5*  --   GFRNONAA 30* 30*  ANIONGAP  --  13    Lipids No results for input(s): "CHOL", "TRIG", "HDL", "LABVLDL", "LDLCALC", "CHOLHDL" in the last 168 hours.  Hematology Recent Labs  Lab 06/21/22 1904 06/22/22 0221  WBC 14.0* 14.4*  RBC 4.25  3.97  HGB 12.6 11.6*  HCT 41.1 38.7  MCV 96.7 97.5  MCH 29.6 29.2  MCHC 30.7 30.0  RDW 14.6 14.7  PLT 203 167   Thyroid No results for input(s): "TSH", "FREET4" in the last 168 hours.  BNP Recent Labs  Lab 06/21/22 1904  BNP 517.5*    DDimer No results for input(s): "DDIMER" in the last 168 hours.   Radiology    ABORTED INVASIVE LAB PROCEDURE  Result Date: 06/22/2022 This case was aborted.  DG Chest 1 View  Result Date: 06/21/2022 CLINICAL DATA:  Shortness of breath EXAM: CHEST  1 VIEW COMPARISON:  02/22/2022 FINDINGS: Hyperinflation. Cardiomegaly. Prior CABG. Left pacer remains in place, unchanged. No confluent airspace opacities, effusions or edema. IMPRESSION: Cardiomegaly, hyperinflation. No acute cardiopulmonary disease. Electronically Signed   By: Rolm Baptise M.D.   On: 06/21/2022 19:12    Cardiac Studies   05/02/21 TTE  IMPRESSIONS     1. Left ventricular ejection fraction, by estimation, is 60 to 65%. The  left ventricle has normal function. The left ventricle has no regional  wall motion  abnormalities. Left ventricular diastolic parameters are  consistent with Grade II diastolic  dysfunction (pseudonormalization).   2. Right ventricular systolic function is normal. The right ventricular  size is mildly enlarged. There is moderately elevated pulmonary artery  systolic pressure. The estimated right ventricular systolic pressure is  0000000 mmHg.   3. Left atrial size was mildly dilated.   4. Right atrial size was mildly dilated.   5. Restricted posterior leaflet. Splay artifact (consider TEE for further  evaluation). The mitral valve is degenerative. Moderate to severe mitral  valve regurgitation. No evidence of mitral stenosis. Moderate mitral  annular calcification.   6. Tricuspid valve regurgitation is moderate to severe.   7. The aortic valve has been repaired/replaced. Aortic valve  regurgitation is moderate to severe. No aortic stenosis is present. There  is a 23 mm Perimount Magna Ease valve present in the aortic position.  Procedure Date: 01/26/13. Echo findings are  consistent with normal structure and function of the aortic valve  prosthesis. Aortic regurgitation PHT measures 317 msec. Aortic valve mean  gradient measures 21.0 mmHg. Aortic valve Vmax measures 3.01 m/s.   8. The inferior vena cava is normal in size with greater than 50%  respiratory variability, suggesting right atrial pressure of 3 mmHg.   Comparison(s): Prior images reviewed side by side. 05/27/19 EF 55-60% AV  8mHg mean PG, 342mg peak PG. Mild AI.   FINDINGS   Left Ventricle: Left ventricular ejection fraction, by estimation, is 60  to 65%. The left ventricle has normal function. The left ventricle has no  regional wall motion abnormalities. The left ventricular internal cavity  size was normal in size. There is   no left ventricular hypertrophy. Left ventricular diastolic parameters  are consistent with Grade II diastolic dysfunction (pseudonormalization).   Right Ventricle: The right  ventricular size is mildly enlarged. No  increase in right ventricular wall thickness. Right ventricular systolic  function is normal. There is moderately elevated pulmonary artery systolic  pressure. The tricuspid regurgitant  velocity is 3.21 m/s, and with an assumed right atrial pressure of 8 mmHg,  the estimated right ventricular systolic pressure is 490000000mHg.   Left Atrium: Left atrial size was mildly dilated.   Right Atrium: Right atrial size was mildly dilated.   Pericardium: There is no evidence of pericardial effusion.   Mitral Valve: Restricted posterior leaflet. Splay artifact (consider  TEE  for further evaluation). The mitral valve is degenerative in appearance.  There is mild thickening of the mitral valve leaflet(s). There is mild  calcification of the mitral valve  leaflet(s). Moderate mitral annular calcification. Moderate to severe  mitral valve regurgitation, with eccentric anteriorly directed jet. No  evidence of mitral valve stenosis.   Tricuspid Valve: The tricuspid valve is normal in structure. Tricuspid  valve regurgitation is moderate to severe. No evidence of tricuspid  stenosis.   Aortic Valve: The aortic valve has been repaired/replaced. Aortic valve  regurgitation is moderate to severe. Aortic regurgitation PHT measures 317  msec. No aortic stenosis is present. Aortic valve mean gradient measures  21.0 mmHg. Aortic valve peak  gradient measures 36.2 mmHg. There is a 23 mm Perimount Magna Ease valve  present in the aortic position. Procedure Date: 01/26/13. Echo findings are  consistent with normal structure and function of the aortic valve  prosthesis.   Pulmonic Valve: The pulmonic valve was normal in structure. Pulmonic valve  regurgitation is trivial. No evidence of pulmonic stenosis.   Aorta: The aortic root is normal in size and structure.   Venous: The inferior vena cava is normal in size with greater than 50%  respiratory variability,  suggesting right atrial pressure of 3 mmHg.   IAS/Shunts: No atrial level shunt detected by color flow Doppler.   Additional Comments: A device lead is visualized in the right ventricle  and right atrium.   08/07/21 LHC/RHC  1.  Patent coronary arteries with mild diffuse nonobstructive irregularity throughout.  There are no high-grade stenoses present. 2.  3+ aortic insufficiency based on angiographic assessment  3.  Unable to direct a straight tip wire from the paravalvular space into the left ventricle 4.  Normal wedge pressure and LVEDP of 13 mmHg and 16 mmHg, respectively 5.  Moderate pulmonary hypertension with mean PA pressure 34 mmHg, transpulmonary gradient 21 mmHg, PVR 4.2 Wood units  Patient Profile     77 y.o. female with history of aortic valve replacement, sick sinus syndrome status post pacemaker, diastolic congestive heart failure, COPD, difficult to control hypertension, dyslipidemia and type 2 diabetes.   Assessment & Plan    Acute on chronic diastolic CHF  - feels much better, can return to home dose of lasix.   Hypertension  - stable today, continue home regimen.    Hx paroxysmal atrial flutter/atrial fibrillation  Resume eliquis on 06/25/22 per EP.   DM type II  Sliding scale insulin while admitted.  Hypothyroidism  Continue Synthroid 51mg.   GERD  Continue home Omeprazole  For questions or updates, please contact [facility] consult [email or web address] for contact info under        Signed, GElouise Munroe MD  06/23/2022, 11:14 AM

## 2022-06-25 ENCOUNTER — Encounter (HOSPITAL_COMMUNITY): Payer: Self-pay | Admitting: Internal Medicine

## 2022-07-04 ENCOUNTER — Ambulatory Visit: Payer: 59

## 2022-07-06 ENCOUNTER — Ambulatory Visit: Payer: 59 | Attending: Cardiology

## 2022-07-06 DIAGNOSIS — I495 Sick sinus syndrome: Secondary | ICD-10-CM | POA: Diagnosis not present

## 2022-07-06 LAB — CUP PACEART INCLINIC DEVICE CHECK
Date Time Interrogation Session: 20240223163820
Implantable Lead Connection Status: 753985
Implantable Lead Connection Status: 753985
Implantable Lead Implant Date: 20140908
Implantable Lead Implant Date: 20140908
Implantable Lead Location: 753859
Implantable Lead Location: 753860
Implantable Lead Model: 4135
Implantable Lead Model: 4136
Implantable Lead Serial Number: 29485718
Implantable Lead Serial Number: 29487623
Implantable Pulse Generator Implant Date: 20240209
Lead Channel Impedance Value: 564 Ohm
Lead Channel Impedance Value: 754 Ohm
Lead Channel Pacing Threshold Amplitude: 0.7 V
Lead Channel Pacing Threshold Amplitude: 0.8 V
Lead Channel Pacing Threshold Pulse Width: 0.4 ms
Lead Channel Pacing Threshold Pulse Width: 0.4 ms
Lead Channel Setting Pacing Amplitude: 1.5 V
Lead Channel Setting Pacing Amplitude: 2 V
Lead Channel Setting Pacing Pulse Width: 0.4 ms
Lead Channel Setting Sensing Sensitivity: 2.5 mV
Pulse Gen Serial Number: 710644
Zone Setting Status: 755011

## 2022-07-06 MED ORDER — CEPHALEXIN 250 MG PO CAPS: 250.0000 mg | ORAL_CAPSULE | Freq: Three times a day (TID) | ORAL | 0 refills | Status: AC

## 2022-07-06 NOTE — Progress Notes (Signed)
Wound check appointment. Steri-strips removed. Prior to steri-strip removal dark liquid seen oozing from underneath steri-strips.  See picture below. Normal device function. Thresholds, sensing, and impedances consistent with implant measurements. Device programmed at chronic outputs for generator change. Histogram distribution appropriate for patient and level of activity. No mode switches or high ventricular rates noted.   Patient educated about wound care:    Pt to wash incision site with soap and water 3-4 times a day with clean washcloth for every wash.   Advised Pt if she developed fever or chills she should call device clinic immediately.  Keflex started- 250 mg PO Q8 hours x 5 days.  Follow up appoointment made with device clinic to reevaluate site next Wednesday.

## 2022-07-06 NOTE — Patient Instructions (Signed)
After Your Pacemaker  Start Keflex 250 mg capsule by mouth every 8 hours for 5 days. Wash your incision site with soap and water 3-4 times a day.  Use a new washcloth every time.   Monitor your pacemaker site for redness, swelling, and drainage. Call the device clinic at [phone] if you experience these symptoms or fever/chills.  Your incision was closed with Steri-strips or staples:  You may shower 7 days after your procedure and wash your incision with soap and water. Avoid lotions, ointments, or perfumes over your incision until it is well-healed.  You may use a hot tub or a pool after your wound check appointment if the incision is completely closed.  There are no restrictions in arm movement after your wound check appointment.  You may drive, unless driving has been restricted by your healthcare providers.  Remote monitoring is used to monitor your pacemaker from home. This monitoring is scheduled every 91 days by our office. It allows [place] to keep an eye on the functioning of your device to ensure it is working properly. You will routinely see your Electrophysiologist annually (more often if necessary).

## 2022-07-11 ENCOUNTER — Ambulatory Visit: Payer: 59 | Attending: Interventional Cardiology

## 2022-07-11 DIAGNOSIS — I495 Sick sinus syndrome: Secondary | ICD-10-CM

## 2022-07-11 NOTE — Progress Notes (Signed)
Follow up wound recheck.  Wound site dry, without redness or edema.  See picture below.  Site assessed with Dr. Lovena Le.  Per Dr. Lovena Le continue to wash gently with soap and water.    Pt will continue to monitor site and call with any further issues or concerns.  Follow up as scheduled.

## 2022-07-12 ENCOUNTER — Ambulatory Visit: Payer: Medicare Other

## 2022-07-12 ENCOUNTER — Ambulatory Visit: Payer: 59 | Admitting: Nurse Practitioner

## 2022-07-12 ENCOUNTER — Ambulatory Visit: Payer: 59 | Admitting: Physician Assistant

## 2022-07-27 NOTE — Progress Notes (Signed)
Remote pacemaker transmission.   

## 2022-08-13 ENCOUNTER — Ambulatory Visit: Payer: Medicare Other

## 2022-09-06 ENCOUNTER — Telehealth: Payer: Self-pay | Admitting: Cardiovascular Disease

## 2022-09-06 NOTE — Telephone Encounter (Signed)
Attempted to call patient, left message for patient to call back to office.   

## 2022-09-06 NOTE — Telephone Encounter (Signed)
Agree - prefer Trulicity over insulin.

## 2022-09-06 NOTE — Telephone Encounter (Signed)
Patient returned RN's call. 

## 2022-09-06 NOTE — Telephone Encounter (Signed)
Returned call to patient who states she wants to see if its okay if her PCP changes her from Lantus to Trulicity. Patient would like to know if this would be okay due to her thyroid and kidney function. Or if any other contraindications. Advised patient I would forward message to PharmD/Dr. C for review. Patient verbalized understanding.

## 2022-09-06 NOTE — Telephone Encounter (Signed)
Pt c/o medication issue:  1. Name of Medication: Lantus 10 mg  2. How are you currently taking this medication (dosage and times per day)? 1 injection a day   3. Are you having a reaction (difficulty breathing--STAT)? No   4. What is your medication issue? Patient is wanting to switch to Trulicity, but is wanting to confirm it's okay to switch with her kidney and thyroid issues.

## 2022-09-06 NOTE — Telephone Encounter (Signed)
Spoke to patient Dr.Croitoru prefers Trulicity over Insulin.

## 2022-09-06 NOTE — Telephone Encounter (Signed)
This is just fine to change to Trulicity - will provide added cardiovascular benefit and a little weight loss in addition to glucose lowering. Will not affect thyroid function (only contraindicated in patients with personal/family history of a specific type of thyroid cancer - MTC or MEN2), GLPs have also shown some trends towards kidney protection.

## 2022-09-08 ENCOUNTER — Emergency Department (HOSPITAL_BASED_OUTPATIENT_CLINIC_OR_DEPARTMENT_OTHER): Payer: 59

## 2022-09-08 ENCOUNTER — Other Ambulatory Visit: Payer: Self-pay

## 2022-09-08 ENCOUNTER — Encounter (HOSPITAL_BASED_OUTPATIENT_CLINIC_OR_DEPARTMENT_OTHER): Payer: Self-pay

## 2022-09-08 ENCOUNTER — Emergency Department (HOSPITAL_BASED_OUTPATIENT_CLINIC_OR_DEPARTMENT_OTHER)
Admission: EM | Admit: 2022-09-08 | Discharge: 2022-09-08 | Disposition: A | Discharge status: Home / Self Care | Payer: 59 | Attending: Emergency Medicine | Admitting: Emergency Medicine

## 2022-09-08 DIAGNOSIS — E119 Type 2 diabetes mellitus without complications: Secondary | ICD-10-CM | POA: Insufficient documentation

## 2022-09-08 DIAGNOSIS — J449 Chronic obstructive pulmonary disease, unspecified: Secondary | ICD-10-CM | POA: Insufficient documentation

## 2022-09-08 DIAGNOSIS — I110 Hypertensive heart disease with heart failure: Secondary | ICD-10-CM | POA: Diagnosis not present

## 2022-09-08 DIAGNOSIS — S0990XA Unspecified injury of head, initial encounter: Secondary | ICD-10-CM | POA: Insufficient documentation

## 2022-09-08 DIAGNOSIS — I509 Heart failure, unspecified: Secondary | ICD-10-CM | POA: Diagnosis not present

## 2022-09-08 DIAGNOSIS — S01511A Laceration without foreign body of lip, initial encounter: Secondary | ICD-10-CM | POA: Insufficient documentation

## 2022-09-08 DIAGNOSIS — W1830XA Fall on same level, unspecified, initial encounter: Secondary | ICD-10-CM | POA: Insufficient documentation

## 2022-09-08 DIAGNOSIS — W19XXXA Unspecified fall, initial encounter: Secondary | ICD-10-CM

## 2022-09-08 DIAGNOSIS — Z7901 Long term (current) use of anticoagulants: Secondary | ICD-10-CM | POA: Insufficient documentation

## 2022-09-08 MED ORDER — LIDOCAINE HCL (PF) 1 % IJ SOLN
5.0000 mL | Freq: Once | INTRAMUSCULAR | Status: AC
  Administered 2022-09-08: 5 mL
  Filled 2022-09-08: qty 5

## 2022-09-08 NOTE — ED Notes (Signed)
Daughter contacted and will provide transport back to [facility]

## 2022-09-08 NOTE — ED Triage Notes (Signed)
Pt was bending over while using the bathroom, fell over and hit the shower. Denies hitting head. Currently taking Eliquis. Laceration to the upper lip.

## 2022-09-08 NOTE — Discharge Instructions (Addendum)
You lip was repaired with a combination of absorbable and nonabsorbable sutures. Recommend you follow-up in 7-10 days for wound reassessment and suture removal. Utilize salt water gargles, watch for signs of infection which include redness, swelling, fevers/chills, and purulent drainage. Your trauma imaging was negative for evidence of acute traumatic injury

## 2022-09-08 NOTE — ED Notes (Signed)
Patient transported to CT 

## 2022-09-08 NOTE — ED Provider Notes (Signed)
[place] [facility] Provider Note   CSN: [id number] Arrival date & time: 09/08/22  4098     History  Chief Complaint  Patient presents with   Fall   Lip Laceration    Tonya Glass is a 77 y.o. female.  HPI   77 year old female with medical history significant for HTN, HLD, anxiety, depression, aortic stenosis, COPD, CHF, DM2, prior CVA who presents to the emergency department after a ground-level fall from her [facility] facility.  The patient states that she was bending over while using the bathroom and fell forward and hit her lip on the shower.  She takes Eliquis.  She sustained a laceration to her upper lip through the entire lip.  Her tetanus is up-to-date.  She denies any other injuries or complaints.  She denies any headaches, neck pain or stiffness.  She arrives to the emerged part GCS 15, ABC intact.  Home Medications Prior to Admission medications   Medication Sig Start Date End Date Taking? Authorizing Provider  acetaminophen (TYLENOL) 650 MG CR tablet Take 650 mg by mouth every 8 (eight) hours as needed (Discomfort).    [provider]  albuterol (PROVENTIL HFA;VENTOLIN HFA) 108 (90 BASE) MCG/ACT inhaler Inhale 2 puffs into the lungs every 6 (six) hours as needed (COPD). 12/08/13   [provider]  albuterol (PROVENTIL) (2.5 MG/3ML) 0.083% nebulizer solution Take 2.5 mg by nebulization every 12 (twelve) hours as needed for wheezing or shortness of breath.    [provider]  alendronate (FOSAMAX) 70 MG tablet Take 70 mg by mouth every Sunday. 07/13/17   [provider]  apixaban (ELIQUIS) 5 MG TABS tablet Take 1 tablet (5 mg total) by mouth 2 (two) times daily. 06/25/22   Jonita Albee, PA-C  atorvastatin (LIPITOR) 40 MG tablet Take 40 mg by mouth daily.    [provider]  budesonide-formoterol (SYMBICORT) 160-4.5 MCG/ACT inhaler Inhale 2 puffs into the lungs 2 (two) times daily.     [provider]  carvedilol (COREG) 3.125 MG tablet Take 1 tablet (3.125 mg total) by mouth 2 (two) times daily. 06/23/22   Jonita Albee, PA-C  cloNIDine (CATAPRES) 0.2 MG tablet Take 1 tablet (0.2 mg total) by mouth at bedtime. 07/05/21   Hilty, Lisette Abu, MD  cyanocobalamin (VITAMIN B12) 500 MCG tablet Take 500 mcg by mouth daily.    [provider]  dapagliflozin propanediol (FARXIGA) 10 MG TABS tablet Take 1 tablet (10 mg total) by mouth daily before breakfast. 06/22/21   Croitoru, Mihai, MD  Ferrous Sulfate (IRON) 325 (65 Fe) MG TABS Take 325 mg by mouth daily. 08/13/17   [provider]  FLUoxetine (PROZAC) 40 MG capsule Take 40 mg by mouth daily.     [provider]  furosemide (LASIX) 40 MG tablet Take 40 mg by mouth daily at 12 noon.    [provider]  gabapentin (NEURONTIN) 300 MG capsule Take 600 mg by mouth at bedtime.    [provider]  glipiZIDE (GLUCOTROL) 10 MG tablet Take 10 mg by mouth 2 (two) times daily before a meal.    [provider]  ipratropium (ATROVENT) 0.06 % nasal spray Place 2 sprays into both nostrils 3 (three) times daily. Patient taking differently: Place 2 sprays into both nostrils every 8 (eight) hours as needed (Allergies). 12/14/21   Charlott Holler, MD  levocetirizine (XYZAL) 5 MG tablet Take 5 mg by mouth at bedtime. 09/05/21  [provider]  levothyroxine (SYNTHROID, LEVOTHROID) 50 MCG tablet Take 50 mcg by mouth daily before breakfast.    [provider]  magnesium oxide (MAG-OX) 400 (240 Mg) MG tablet Take 400 mg by mouth daily.    [provider]  nystatin ointment (MYCOSTATIN) Apply 1 application  topically daily as needed (rash). 100,000- units/gram Patient not taking: Reported on 06/20/2022    [provider]  olmesartan (BENICAR) 20 MG tablet Take 0.5 tablets (10 mg total) by mouth daily. Patient taking differently: Take 20 mg by mouth daily. 09/11/21    Croitoru, Mihai, MD  omeprazole (PRILOSEC) 40 MG capsule Take 1 capsule (40 mg total) by mouth daily. 12/14/21   Charlott Holler, MD  sodium chloride (OCEAN) 0.65 % SOLN nasal spray Place 2 sprays into both nostrils 2 (two) times daily.    [provider]  temazepam (RESTORIL) 30 MG capsule Take 30 mg by mouth at bedtime. 07/07/17   [provider]  traZODone (DESYREL) 50 MG tablet Take 25 mg by mouth at bedtime. 04/23/22   [provider]  Vitamins A & D (VITAMIN A & D) ointment Apply 1 Application topically 3 (three) times daily. Additional every 8 hours for skin breakdown    [provider]      Allergies    Patient has no known allergies.    Review of Systems   Review of Systems  All other systems reviewed and are negative.   Physical Exam Updated Vital Signs BP (!) 149/56 (BP Location: Left Arm)   Pulse (!) 59   Temp 98.4 F (36.9 C) (Oral)   Resp 18   SpO2 100%  Physical Exam Vitals and nursing note reviewed.  Constitutional:      General: She is not in acute distress.    Appearance: She is well-developed.     Comments: GCS 15, ABC intact  HENT:     Head: Normocephalic.     Comments: Through and through laceration to the upper lip, just barely involves the vermilion border, hemostatic with pressure Eyes:     Extraocular Movements: Extraocular movements intact.     Conjunctiva/sclera: Conjunctivae normal.     Pupils: Pupils are equal, round, and reactive to light.  Neck:     Comments: No midline tenderness to palpation of the cervical spine.  Range of motion intact Cardiovascular:     Rate and Rhythm: Normal rate and regular rhythm.  Pulmonary:     Effort: Pulmonary effort is normal. No respiratory distress.     Breath sounds: Normal breath sounds.  Chest:     Comments: Clavicles stable nontender to AP compression.  Chest wall stable and nontender to AP and lateral compression. Abdominal:     Palpations: Abdomen is soft.      Tenderness: There is no abdominal tenderness.     Comments: Pelvis stable to lateral compression  Musculoskeletal:     Cervical back: Neck supple.     Comments: No midline tenderness to palpation of the thoracic or lumbar spine.  Extremities atraumatic with intact range of motion with the exception of right elbow tenderness to palpation, distally neurovascularly intact  Skin:    General: Skin is warm and dry.  Neurological:     Mental Status: She is alert.     Comments: Cranial nerves II through XII grossly intact.  Moving all 4 extremities spontaneously.  Sensation grossly intact all 4 extremities     ED Results / Procedures / Treatments  Labs (all labs ordered are listed, but only abnormal results are displayed) Labs Reviewed - No data to display  EKG None  Radiology DG Elbow Complete Right  Result Date: 09/08/2022 CLINICAL DATA:  Elbow pain after falling and hitting head. On Eliquis. EXAM: RIGHT ELBOW - COMPLETE 3+ VIEW COMPARISON:  None Available. FINDINGS: The mineralization and alignment are normal. There is no evidence of acute fracture or dislocation. There are mild degenerative changes with spurring of the olecranon and coracoid processes. Possible mild soft tissue swelling and irregularity over the olecranon process. No joint effusion or foreign body demonstrated. IMPRESSION: No evidence of acute fracture or dislocation. Possible mild soft tissue swelling and irregularity over the olecranon process. Electronically Signed   By: Carey Bullocks M.D.   On: 09/08/2022 10:37   CT HEAD WO CONTRAST  Result Date: 09/08/2022 CLINICAL DATA:  Moderate to severe head trauma. Fall while using the bathroom. EXAM: CT HEAD WITHOUT CONTRAST CT CERVICAL SPINE WITHOUT CONTRAST TECHNIQUE: Multidetector CT imaging of the head and cervical spine was performed following the standard protocol without intravenous contrast. Multiplanar CT image reconstructions of the cervical spine were also generated.  RADIATION DOSE REDUCTION: This exam was performed according to the departmental dose-optimization program which includes automated exposure control, adjustment of the mA and/or kV according to patient size and/or use of iterative reconstruction technique. COMPARISON:  12/15/2018 FINDINGS: CT HEAD FINDINGS Brain: No evidence of acute infarction, hemorrhage, hydrocephalus, extra-axial collection or mass lesion/mass effect. Remote left basal ganglia lacunar infarct. There is mild patchy low-attenuation within the subcortical and periventricular white matter compatible with chronic microvascular disease. Vascular: No hyperdense vessel or unexpected calcification. Skull: Normal. Negative for fracture or focal lesion. Sinuses/Orbits: Paranasal sinuses and the mastoid air cells are clear. Other: None. CT CERVICAL SPINE FINDINGS Alignment: Normal. Skull base and vertebrae: No acute fracture. No primary bone lesion or focal pathologic process. Soft tissues and spinal canal: No prevertebral fluid or swelling. No visible canal hematoma. Disc levels: Mild multilevel disc space narrowing and endplate spurring. Upper chest: Negative. Other: None IMPRESSION: 1. No acute intracranial abnormality. 2. Chronic microvascular disease and brain atrophy. 3. No evidence for cervical spine fracture or subluxation. 4. Mild cervical degenerative disc disease. Electronically Signed   By: Signa Kell M.D.   On: 09/08/2022 10:22   CT CERVICAL SPINE WO CONTRAST  Result Date: 09/08/2022 CLINICAL DATA:  Moderate to severe head trauma. Fall while using the bathroom. EXAM: CT HEAD WITHOUT CONTRAST CT CERVICAL SPINE WITHOUT CONTRAST TECHNIQUE: Multidetector CT imaging of the head and cervical spine was performed following the standard protocol without intravenous contrast. Multiplanar CT image reconstructions of the cervical spine were also generated. RADIATION DOSE REDUCTION: This exam was performed according to the departmental  dose-optimization program which includes automated exposure control, adjustment of the mA and/or kV according to patient size and/or use of iterative reconstruction technique. COMPARISON:  12/15/2018 FINDINGS: CT HEAD FINDINGS Brain: No evidence of acute infarction, hemorrhage, hydrocephalus, extra-axial collection or mass lesion/mass effect. Remote left basal ganglia lacunar infarct. There is mild patchy low-attenuation within the subcortical and periventricular white matter compatible with chronic microvascular disease. Vascular: No hyperdense vessel or unexpected calcification. Skull: Normal. Negative for fracture or focal lesion. Sinuses/Orbits: Paranasal sinuses and the mastoid air cells are clear. Other: None. CT CERVICAL SPINE FINDINGS Alignment: Normal. Skull base and vertebrae: No acute fracture. No primary bone lesion or focal pathologic process. Soft tissues and spinal canal: No prevertebral fluid or swelling. No visible canal  hematoma. Disc levels: Mild multilevel disc space narrowing and endplate spurring. Upper chest: Negative. Other: None IMPRESSION: 1. No acute intracranial abnormality. 2. Chronic microvascular disease and brain atrophy. 3. No evidence for cervical spine fracture or subluxation. 4. Mild cervical degenerative disc disease. Electronically Signed   By: Signa Kell M.D.   On: 09/08/2022 10:22    Procedures .Marland KitchenLaceration Repair  Date/Time: 09/08/2022 12:09 PM  Performed by: Ernie Avena, MD Authorized by: Ernie Avena, MD   Consent:    Consent obtained:  Verbal   Consent given by:  Patient   Risks discussed:  Poor cosmetic result and pain Universal protocol:    Patient identity confirmed:  Verbally with patient Anesthesia:    Anesthesia method:  Local infiltration   Local anesthetic:  Lidocaine 1% w/o epi Laceration details:    Location:  Lip   Lip location:  Upper lip, full thickness   Vermilion border involved: yes     Height of lip laceration:  More than half  vertical height   Length (cm):  1 Treatment:    Area cleansed with:  Saline   Amount of cleaning:  Standard Skin repair:    Repair method:  Sutures   Suture size:  5-0   Suture material:  Nylon (Vicryl)   Number of sutures:  4 Approximation:    Approximation:  Close   Vermilion border well-aligned: yes   Repair type:    Repair type:  Intermediate Post-procedure details:    Dressing:  Open (no dressing)   Procedure completion:  Tolerated Comments:     1 Nylong suture to re-approximate the vermilion border, 1 deep absorbable suture for muscular closure, and 2 additional absorbable sutures for additional approximation of the lips     Medications Ordered in ED Medications  lidocaine (PF) (XYLOCAINE) 1 % injection 5 mL (5 mLs Infiltration Given by Other 09/08/22 1112)    ED Course/ Medical Decision Making/ A&P                             Medical Decision Making Amount and/or Complexity of Data Reviewed Radiology: ordered.  Risk Prescription drug management.    77 year old female with medical history significant for HTN, HLD, anxiety, depression, aortic stenosis, COPD, CHF, DM2, prior CVA who presents to the emergency department after a ground-level fall from her [place] nursing facility.  The patient states that she was bending over while using the bathroom and fell forward and hit her lip on the shower.  She takes Eliquis.  She sustained a laceration to her upper lip through the entire lip.  Her tetanus is up-to-date.  She denies any other injuries or complaints.  She denies any headaches, neck pain or stiffness.  She arrives to the emerged part GCS 15, ABC intact.  On arrival, the patient was vitally stable, afebrile, not tachycardic or tachypneic, BP 139/40, saturating 97% on room air.  Physical exam concerning for an upper lip laceration that was through and through and barely involves the vermilion border.  Some right elbow tenderness to palpation.  CT imaging head and  cervical spine: IMPRESSION:  1. No acute intracranial abnormality.  2. Chronic microvascular disease and brain atrophy.  3. No evidence for cervical spine fracture or subluxation.  4. Mild cervical degenerative disc disease.    XR Right Elbow: Negative  The patient's upper lip laceration was repaired as per the procedure note above. Vermillion border was approximated with a single  non-absorbable Nylon suture. She was advised to follow-up in 7 to 10 days for wound reassessment, suture removal.  Return precautions provided in the event of infectious concern.  Overall stable for discharge.   Final Clinical Impression(s) / ED Diagnoses Final diagnoses:  Fall, initial encounter  Lip laceration, initial encounter    Rx / DC Orders ED Discharge Orders     None         Ernie Avena, MD 09/08/22 1442

## 2022-09-13 ENCOUNTER — Ambulatory Visit: Payer: Medicare Other

## 2022-10-01 ENCOUNTER — Encounter: Payer: Self-pay | Admitting: Cardiovascular Disease

## 2022-10-01 ENCOUNTER — Ambulatory Visit: Payer: 59 | Attending: Cardiovascular Disease | Admitting: Cardiovascular Disease

## 2022-10-01 VITALS — BP 120/62 | HR 68 | Ht 60.0 in | Wt 220.4 lb

## 2022-10-01 DIAGNOSIS — I10 Essential (primary) hypertension: Secondary | ICD-10-CM

## 2022-10-01 DIAGNOSIS — E039 Hypothyroidism, unspecified: Secondary | ICD-10-CM | POA: Diagnosis not present

## 2022-10-01 DIAGNOSIS — I351 Nonrheumatic aortic (valve) insufficiency: Secondary | ICD-10-CM

## 2022-10-01 DIAGNOSIS — I495 Sick sinus syndrome: Secondary | ICD-10-CM

## 2022-10-01 DIAGNOSIS — I5032 Chronic diastolic (congestive) heart failure: Secondary | ICD-10-CM

## 2022-10-01 DIAGNOSIS — Z953 Presence of xenogenic heart valve: Secondary | ICD-10-CM

## 2022-10-01 DIAGNOSIS — I441 Atrioventricular block, second degree: Secondary | ICD-10-CM

## 2022-10-01 DIAGNOSIS — I872 Venous insufficiency (chronic) (peripheral): Secondary | ICD-10-CM

## 2022-10-01 DIAGNOSIS — I2721 Secondary pulmonary arterial hypertension: Secondary | ICD-10-CM

## 2022-10-01 DIAGNOSIS — I50812 Chronic right heart failure: Secondary | ICD-10-CM

## 2022-10-01 DIAGNOSIS — Z950 Presence of cardiac pacemaker: Secondary | ICD-10-CM

## 2022-10-01 DIAGNOSIS — J449 Chronic obstructive pulmonary disease, unspecified: Secondary | ICD-10-CM

## 2022-10-01 DIAGNOSIS — Z9889 Other specified postprocedural states: Secondary | ICD-10-CM

## 2022-10-01 DIAGNOSIS — I2510 Atherosclerotic heart disease of native coronary artery without angina pectoris: Secondary | ICD-10-CM

## 2022-10-01 DIAGNOSIS — E785 Hyperlipidemia, unspecified: Secondary | ICD-10-CM

## 2022-10-01 DIAGNOSIS — D6869 Other thrombophilia: Secondary | ICD-10-CM

## 2022-10-01 DIAGNOSIS — N1832 Chronic kidney disease, stage 3b: Secondary | ICD-10-CM

## 2022-10-01 DIAGNOSIS — E1122 Type 2 diabetes mellitus with diabetic chronic kidney disease: Secondary | ICD-10-CM

## 2022-10-01 DIAGNOSIS — Z7985 Long-term (current) use of injectable non-insulin antidiabetic drugs: Secondary | ICD-10-CM

## 2022-10-01 DIAGNOSIS — Z794 Long term (current) use of insulin: Secondary | ICD-10-CM

## 2022-10-01 DIAGNOSIS — I484 Atypical atrial flutter: Secondary | ICD-10-CM

## 2022-10-01 NOTE — Patient Instructions (Signed)
Medication Instructions:  No changes *If you need a refill on your cardiac medications before your next appointment, please call your pharmacy*   Lab Work: Lipid, A1C, CMET, TSH- today If you have labs (blood work) drawn today and your tests are completely normal, you will receive your results only by: MyChart Message (if you have MyChart) OR A paper copy in the mail If you have any lab test that is abnormal or we need to change your treatment, we will call you to review the results.  Follow-Up: At [facility], you and your health needs are our priority.  As part of our continuing mission to provide you with exceptional heart care, we have created designated Provider Care Teams.  These Care Teams include your primary Cardiologist (physician) and Advanced Practice Providers (APPs -  Physician Assistants and Nurse Practitioners) who all work together to provide you with the care you need, when you need it.  We recommend signing up for the patient portal called "MyChart".  Sign up information is provided on this After Visit Summary.  MyChart is used to connect with patients for Virtual Visits (Telemedicine).  Patients are able to view lab/test results, encounter notes, upcoming appointments, etc.  Non-urgent messages can be sent to your provider as well.   To learn more about what you can do with MyChart, go to [email or web address].    Your next appointment:   6 month(s)- PHYS PACER CK  Provider:   Device check

## 2022-10-01 NOTE — Progress Notes (Signed)
Cardiology Office Note    Date:  10/03/2022   ID:  [facility], DOB [birth year], MRN [id number]  PCP:  Tonya Man, NP  Cardiologist:  [facility], M.D.; [facility] Fair, MD   Chief complaint: CHF, aortic insufficiency  History of Present Illness:  [facility] is a 77 y.o. 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  She had paroxysmal atrial flutter and underwent successful overdrive pacing in 2021, with recurrence in January 2022 that resolved spontaneously.  She has minor CAD by angiography (40% mid LAD pre-AVR 2014; mild diffuse nonobstructive irregularity by angiography 2023).      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  TEE, CT angiography and aortic angiography also showed moderate aortic insufficiency with a perivalvular leak in the vicinity of the left coronary ostium.    Mitral regurgitation, which appeared moderate-severe by transthoracic echo, was much less significant at the time of the TEE, suggesting a secondary/functional mechanism.    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  She is a full-time resident at [facility].  Leg edema is fairly well-controlled with a combination of elastic wraps and diuretics.  She does not currently have any ulcers/weeping wounds.  She habitually sleeps in a recliner due to problems with tailbone pain.  Has not had overt orthopnea or PND and is quite sedentary, so is difficult to assess her functional status.  She has not had this pain, palpitations, dizziness, syncope or focal neurological complaints.  Denies bleeding.  We do not talk much today about her problems with hypersomnolence.  She had a fall in the bathroom when she was using the commode and simply toppled forward.  She did not have dizziness or syncope with it.  She required stitches for a laceration of her upper lip.  She had an uncomplicated generator change out in February with Dr. Graciela [facility].  Device interrogation shows normal pacemaker function.  She has 81% atrial pacing and 98% ventricular pacing.  There has been no atrial fibrillation or ventricular tachycardia.  For her sedentary lifestyle, the heart rate histogram distribution appears appropriate.  Presenting rhythm today is AV sequential pacing.  She has had atrial flutter recorded on her previous device and is on chronic anticoagulation with [facility].  Uncertain about her dry weight anymore.  She has gained 7 pounds since her last appointment with me.  At 220 pounds she is 24 pounds heavier than when she had a right heart catheterization about a year ago (March 2023, she weighed only 196 pounds).  She is currently taking furosemide 80 mg in the morning and 40 mg in the afternoon as well as [place].  We checked labs today.  She has good glycemic control with a hemoglobin A1c of 6.5%.  Her creatinine is at baseline at 1.5 (generally 1.2-1.6).  Potassium level is high normal at 5.0.  Liver function tests and TSH are normal range.  As far as I can tell she never had her sleep study.  Her last  appointment with her pulmonary specialist in August mentions scheduling a sleep study but I do not think this has occurred.  Her pulmonary function test showed normal pulmonary function with some evidence of air trapping/hyperinflation.  She had no improvement in symptoms upon a trial of prednisone.  She was supposed to have a follow-up visit in 3 months, but Mrs. [facility] tells me that she thought she was "released" from the pulmonary clinic.   Past Medical History:  Diagnosis Date   Anxiety    Aortic valve stenosis 10/10/2016   Arthritis    "fingers" (07/24/2017)   CHF (congestive heart failure) (HCC)    COPD (chronic obstructive pulmonary disease) (HCC)    Depression    Gallstones    Heart murmur    High cholesterol    History of kidney stones    Hypertension    Hypothyroidism    Migraines    "none in years til 01/2013-02/2013; none since"  (07/24/2017)   Orthopnoea    "just since open heart surgery" (02/25/2013)   Pacemaker    [employer]    Pneumonia ~ 2012   "walking pneumonia"   Pneumonia 07/23/2017   Stroke (HCC) 2001   denies residual on 02/25/2013   Symptomatic hypotension 10/10/2016   Syncope and collapse    "prior to open heart surgery" (02/25/2013)   Type II diabetes mellitus (HCC)     Past Surgical History:  Procedure Laterality Date   AORTIC ARCH ANGIOGRAPHY N/A 08/07/2021   Procedure: AORTIC ARCH ANGIOGRAPHY;  Surgeon: Tonya Bollman, MD;  Location[facility] INVASIVE CV LAB;  Service: Cardiovascular;  Laterality: N/A;   BREAST SURGERY Right    "clamped blood vessel and stitched it up; in [place]"   CARDIAC CATHETERIZATION     CARDIAC VALVE REPLACEMENT  01/26/2013   "cow valve put in; widened the second valve" (02/25/2013) - [place]   CATARACT EXTRACTION W/ INTRAOCULAR LENS  IMPLANT, BILATERAL Bilateral 04/2017   CHOLECYSTECTOMY OPEN  1971   CYST EXCISION     "taken off my chest"   INSERT / REPLACE / REMOVE PACEMAKER  01/19/2013   [employer] - dual  chamber   LAPAROSCOPIC GASTRIC SLEEVE RESECTION  2009   in [place]   PPM GENERATOR CHANGEOUT N/A 06/22/2022   Procedure: PPM GENERATOR CHANGEOUT;  Surgeon: Tonya Salvia, MD;  Location: [facility] INVASIVE CV LAB;  Service: Cardiovascular;  Laterality: N/A;   RIGHT/LEFT HEART CATH AND CORONARY ANGIOGRAPHY N/A 08/07/2021   Procedure: RIGHT/LEFT HEART CATH AND CORONARY ANGIOGRAPHY;  Surgeon: Tonya Bollman, MD;  Location: [facility] INVASIVE CV LAB;  Service: Cardiovascular;  Laterality: N/A;   TEE WITHOUT CARDIOVERSION N/A 06/13/2021   Procedure: TRANSESOPHAGEAL ECHOCARDIOGRAM (TEE);  Surgeon: [facility] Fair, MD;  Location: [facility] ENDOSCOPY;  Service: Cardiovascular;  Laterality: N/A;   VEIN SURGERY Left    LLE; cut leg open and took out piece of vein that was protruding thru skin; stitched it up; done in [place]    Current Medications: Outpatient Medications Prior to Visit  Medication Sig Dispense Refill   acetaminophen (TYLENOL) 650 MG CR tablet Take 650 mg by mouth every 8 (eight) hours as needed (Discomfort).     albuterol (PROVENTIL HFA;VENTOLIN HFA) 108 (90 BASE) MCG/ACT inhaler Inhale 2 puffs  into the lungs every 6 (six) hours as needed (COPD).     alendronate (FOSAMAX) 70 MG tablet Take 70 mg by mouth every Sunday.     apixaban ([facility]) 5 MG TABS tablet Take 1 tablet (5 mg total) by mouth 2 (two) times daily. 180 tablet 1   atorvastatin (LIPITOR) 40 MG tablet Take 40 mg by mouth daily.     budesonide-formoterol (SYMBICORT) 160-4.5 MCG/ACT inhaler Inhale 2 puffs into the lungs 2 (two) times daily.     carvedilol (COREG) 3.125 MG tablet Take 1 tablet (3.125 mg total) by mouth 2 (two) times daily.     cloNIDine (CATAPRES) 0.2 MG tablet Take 1 tablet (0.2 mg total) by mouth at bedtime. 30 tablet 11   cyanocobalamin (VITAMIN B12) 500 MCG tablet Take 500 mcg by mouth daily.     dapagliflozin propanediol (FARXIGA) 10 MG TABS tablet Take 1 tablet (10 mg total) by mouth daily before breakfast. 30 tablet 4    Ferrous Sulfate (IRON) 325 (65 Fe) MG TABS Take 325 mg by mouth daily.  0   FLUoxetine (PROZAC) 40 MG capsule Take 40 mg by mouth daily.      furosemide (LASIX) 40 MG tablet Take 40 mg by mouth daily at 12 noon.     gabapentin (NEURONTIN) 300 MG capsule Take 600 mg by mouth at bedtime.     glipiZIDE (GLUCOTROL) 10 MG tablet Take 10 mg by mouth 2 (two) times daily before a meal.     ipratropium (ATROVENT) 0.06 % nasal spray Place 2 sprays into both nostrils 3 (three) times daily. (Patient taking differently: Place 2 sprays into both nostrils every 8 (eight) hours as needed (Allergies).) 15 mL 12   LANTUS 100 UNIT/ML injection Inject 10 Units into the skin daily.     levocetirizine (XYZAL) 5 MG tablet Take 5 mg by mouth at bedtime.     levothyroxine (SYNTHROID, LEVOTHROID) 50 MCG tablet Take 50 mcg by mouth daily before breakfast.     magnesium oxide (MAG-OX) 400 (240 Mg) MG tablet Take 400 mg by mouth daily.     nystatin ointment (MYCOSTATIN) Apply 1 application  topically daily as needed (rash). 100,000- units/gram     olmesartan (BENICAR) 20 MG tablet Take 0.5 tablets (10 mg total) by mouth daily. (Patient taking differently: Take 20 mg by mouth daily.) 45 tablet 1   omeprazole (PRILOSEC) 40 MG capsule Take 1 capsule (40 mg total) by mouth daily. 30 capsule 5   sodium chloride (OCEAN) 0.65 % SOLN nasal spray Place 2 sprays into both nostrils 2 (two) times daily.     temazepam (RESTORIL) 30 MG capsule Take 30 mg by mouth at bedtime.     traMADol (ULTRAM) 50 MG tablet Take 50 mg by mouth as needed.     traZODone (DESYREL) 50 MG tablet Take 25 mg by mouth at bedtime.     Vitamins A & D (VITAMIN A & D) ointment Apply 1 Application topically 3 (three) times daily. Additional every 8 hours for skin breakdown     albuterol (PROVENTIL) (2.5 MG/3ML) 0.083% nebulizer solution Take 2.5 mg by nebulization every 12 (twelve) hours as needed for wheezing or shortness of breath. (Patient not taking: Reported on  10/01/2022)     No facility-administered medications prior to visit.     Allergies:   Patient has no known allergies.   Social History   Socioeconomic History   Marital status: Widowed    Spouse name: Not on file  Number of children: 2   Years of education: dental sch   Highest education level: Not on file  Occupational History   Occupation: retired  Tobacco Use   Smoking status: Former    Packs/day: 2.00    Years: 40.00    Additional pack years: 0.00    Total pack years: 80.00    Types: Cigarettes    Quit date: 05/29/1999    Years since quitting: 23.3   Smokeless tobacco: Never  Vaping Use   Vaping Use: Never used  Substance and Sexual Activity   Alcohol use: No   Drug use: No   Sexual activity: Yes  Other Topics Concern   Not on file  Social History Narrative   Not on file   Social Determinants of Health   [facility] Resource Strain: Not on file  Food Insecurity: No Food Insecurity (06/22/2022)   Hunger Vital Sign    Worried About Running Out of Food in the Last Year: Never true    Ran Out of Food in the Last Year: Never true  Transportation Needs: No Transportation Needs (06/22/2022)   PRAPARE - Administrator, Civil Service (Medical): No    Lack of Transportation (Non-Medical): No  Physical Activity: Not on file  Stress: Not on file  Social Connections: Not on file     Family History:  The patient's family history includes Asthma in her brother; Breast cancer in her maternal aunt; Colon polyps in her brother and daughter; Diabetes in her maternal aunt and paternal aunt; Heart Problems in her father; Hypertension in her brother; Lung cancer in her mother; Skin cancer in her brother.   ROS:   Please see the history of present illness.    ROS All other systems are reviewed and are negative.   PHYSICAL EXAM:   VS:  BP 120/62 (BP Location: Left Arm, Patient Position: Sitting, Cuff Size: Normal)   Pulse 68   Ht 5' (1.524 m)   Wt 220 lb 6.4 oz (100  kg)   SpO2 98%   BMI 43.04 kg/m      General: Alert, oriented x3, no distress, morbid obesity.  Healthy left subclavian pacemaker site.  There are prominent collateral veins overlying the left half of the chest, none on the right. Head: no evidence of trauma, PERRL, EOMI, no exophtalmos or lid lag, no myxedema, no xanthelasma; normal ears, nose and oropharynx Neck: normal jugular venous pulsations and no hepatojugular reflux; brisk carotid pulses without delay and no carotid bruits Chest: clear to auscultation, no signs of consolidation by percussion or palpation, normal fremitus, symmetrical and full respiratory excursions Cardiovascular: normal position and quality of the apical impulse, regular rhythm, normal first and second heart sounds, no murmurs, rubs or gallops Abdomen: no tenderness or distention, no masses by palpation, no abnormal pulsatility or arterial bruits, normal bowel sounds, no hepatosplenomegaly Extremities: Legs are wrapped with elastic bandages.  There does not appear to be a lot of residual edema at this time. Neurological: grossly nonfocal Psych: Normal mood and affect     Wt Readings from Last 3 Encounters:  10/01/22 220 lb 6.4 oz (100 kg)  06/22/22 209 lb 10.5 oz (95.1 kg)  02/08/22 213 lb (96.6 kg)      Studies/Labs Reviewed:   TEE 06/13/2021  1. Left ventricular ejection fraction, by estimation, is 60 to 65%. The  left ventricle has normal function. The left ventricle has no regional  wall motion abnormalities. Left ventricular diastolic parameters are  consistent with Grade II diastolic  dysfunction (pseudonormalization). Elevated left atrial pressure.   2. Right ventricular systolic function is mildly reduced. The right  ventricular size is mildly enlarged. There is mildly elevated pulmonary  artery systolic pressure. The estimated right ventricular systolic  pressure is 39.6 mmHg.   3. Left atrial size was mildly dilated. No left atrial/left  atrial  appendage thrombus was detected.   4. Right atrial size was mildly dilated.   5. 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 The effective  regurgitant orifice area is 0.2 cm sq, regurgitant volume 45 ml  (regurgitant fraction is inaccurate due  to significant aortic insufficiency. The mitral valve is rheumatic. Mild  to moderate mitral valve regurgitation. No evidence of mitral stenosis.   6. Tricuspid valve regurgitation is moderate.   7. There appear to be two areas of perivalvular leak of the bioprosthetic  AVR. A larger jet is located at 3 o'clock in the vicinity of the left  coronary cusp. A smaller jet is located posteriorly at 12 o'clock towards  the mitral annulus. There is also a   trivial intravalvular jet. The aortic valve has been repaired/replaced.  Aortic valve regurgitation is moderate. There is a 23 mm Magna valve  present in the aortic position. Procedure Date: 2014 [facility]. Aortic  valve mean gradient measures 13.9 mmHg.  Aortic valve Vmax measures 2.70 m/s. Aortic valve acceleration time  measures 74 msec.   8. S/P ascending aorta graft repair, which is intact. Aortic  root/ascending aorta has been repaired/replaced. There is Moderate (Grade  III) plaque involving the descending aorta.   Cardiac CT angiography 07/06/2021 1.  Status post 23mm Perimount Magna Ease aortic valve replacement   2. There is a paravalular leak adjacent to the left coronary cusp. PVL measures 8mm x 3mm in diameter with perimeter 20mm and area 15mm^2, and involved angle 34 degrees. There also appears to be a  second small PVL in the left coronary cusp close to the commissure between the left and noncoronary cusps that measures 4mm x 2mm in diameter with perimeter 11mm and area 67mm^2, and involved angle 15 degrees.   3. Coronary calcium score 500 (86th percentile)    Right and left heart  catheterization 08/07/2021  1.  Patent coronary arteries with mild diffuse nonobstructive irregularity throughout.  There are no high-grade stenoses present. 2.  3+ aortic insufficiency based on angiographic assessment  3.  Unable to direct a straight tip wire from the paravalvular space into the left ventricle 4.  Normal wedge pressure and LVEDP of 13 mmHg and 16 mmHg, respectively 5.  Moderate pulmonary hypertension with mean PA pressure 34 mmHg, transpulmonary gradient 21 mmHg, PVR 4.2 Wood units   Plan: Continue medical therapy, review case with multidisciplinary heart valve team for consideration of further treatment options. Fick Cardiac Output 4.97 L/min  Fick Cardiac Output Index 2.65 (L/min)/BSA  RA A Wave 13 mmHg  RA V Wave 13 mmHg  RA Mean 8 mmHg  RV Systolic Pressure 46 mmHg  RV Diastolic Pressure 2 mmHg  RV EDP 10 mmHg  PA Systolic Pressure 50 mmHg  PA Diastolic Pressure 16 mmHg  PA Mean 34 mmHg  PW A Wave 17 mmHg  PW V Wave 14 mmHg  PW Mean 13 mmHg  AO Systolic Pressure 191 mmHg  AO Diastolic Pressure 57 mmHg  AO Mean 111 mmHg  LV Systolic Pressure 206 mmHg  LV  Diastolic Pressure 1 mmHg  LV EDP 16 mmHg  AOp Systolic Pressure 193 mmHg  AOp Diastolic Pressure 56 mmHg  AOp Mean Pressure 110 mmHg  LVp Systolic Pressure 203 mmHg  LVp Diastolic Pressure 3 mmHg  LVp EDP Pressure 13 mmHg  QP/QS 1  TPVR Index 12.83 HRUI  TSVR Index 41.87 HRUI  PVR SVR Ratio 0.2  TPVR/TSVR Ratio 0.31      EKG:  EKG is not ordered today and personally reviewed.  Presenting intracardiac electrogram is AV sequential pacing.  Most recent tracing shows AV sequential pacing with a very long delay at about 400 ms.  Some of the QRS complexes are narrow, suggesting some degree of native conduction with fusion.  Note positive R wave in lead V1.    ASSESSMENT:    1. Chronic diastolic heart failure (HCC)   2. Acquired hypothyroidism   3. Chronic right heart failure (HCC)   4. PAH (pulmonary  artery hypertension) (HCC)   5. Peripheral venous insufficiency   6. Nonrheumatic aortic (valve) insufficiency   7. S/P aortic valve replacement with bioprosthetic valve   8. H/O aortic root repair   9. SSS (sick sinus syndrome) (HCC)   10. Second degree atrioventricular block   11. Pacemaker   12. Essential hypertension   13. Coronary artery disease involving native coronary artery of native heart without angina pectoris   14. Atypical atrial flutter (HCC)   15. Acquired thrombophilia (HCC)   16. Type 2 diabetes mellitus with stage 3b chronic kidney disease, without long-term current use of insulin (HCC)   17. Dyslipidemia   18. Chronic obstructive pulmonary disease, unspecified COPD type (HCC)   19. Morbid obesity (HCC)      PLAN:  In order of problems listed above:  CHF: As far as I can tell she is euvolemic or mildly hypervolemic, but does not have any respiratory difficulty.  Unfortunately she has gained a lot of true weight and I am not certain anymore about her dry weight.  She does not have any active venous stasis ulcers or weeping from her leg edema.  Her breathing is at baseline.  She is quite sedentary.   In addition to a well-managed component of left heart failure, the dominant abnormality is persistent pulmonary hypertension, even when left heart filling pressures are normal.  This is consistent with intrinsic pulmonary arteriolar disease.  Sleep study is planned.  On the day of cardiac catheterization her weight was documented as 196 pounds and she is 17 pounds higher today, but it looks like she is gaining some true weight as well.  Continue Farxiga 10 mg daily, will cut back her furosemide to 80 mg in the morning 40 mg in the afternoon in view of her labs obtained today.  Reassess her "dry weight" at 215 pounds or less. 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  Has not yet had a sleep study.  Even when her pulmonary wedge pressure was in normal range  she continues to have edema and signs and symptoms of right heart failure.  Preserved cardiac output.  Her COPD appears to be quite mild, so question if her PAH is due to obstructive sleep apnea or obesity hypoventilation syndrome.  Venous bicarb checked today is normal.  ABG at the time of cardiac catheterization showed normal PO2 levels, but I am not sure whether she was wearing oxygen.  PCO2 was elevated at 48, consistent with some degree of hypoventilation.  As far as  I can tell she does not currently have any evidence of acute exacerbation of right heart failure. Venous stasis ulcer/peripheral venous insufficiency: Ulcers have healed, it is important to prevent recurrence of severe edema.  Doppler ultrasound performed 04/21/2021 did not show any evidence of DVT or venous reflux that would be amenable to treatment.  S/p AVR: Longstanding perivalvular aortic insufficiency, present since the initial surgery.  Results at cardiac catheterization suggest that is not hemodynamically important.  Even if it were significant, attempts to cross the perivalvular defect with a guidewire were unsuccessful, suggesting this will be very difficult to correct with percutaneous means.  She is not a good candidate for repeat open heart surgery. History of aortic root replacement: Intact ascending aortic repair by TEE and CT. SSS: She has sinus bradycardia and mostly atrial paced rhythm with acceptable heart rates considering her sedentary lifestyle. Second-degree AV block: Even though she is not truly pacemaker dependent, the extremely long AV conduction time makes her have virtually 100% ventricular pacing.  Device is subsequently programmed with physiological AV delays.  Pacemaker: Normal device function.  Recent generator change out in February.  Site has healed well.  Continue remote downloads every 3 months. HTN:  very good control. Coronary atherosclerosis.  No angina pectoris.  Nonobstructive lesions by  catheterization 2014 and in 2023, no evidence of disease progression.  However, calcium score on the most recent CT is high at 500 (86 percentile).  The focus is on risk factor modification. Aflutter: Had a 4-hour episode of atrial flutter on August 25, rate controlled and asymptomatic, the first event since January, when it also stopped spontaneously.  Successfully underwent overdrive pacing in April 2021.  The burden of arrhythmia is low and antiarrhythmics are not indicated.  On appropriate [facility] anticoagulation. CHA2DS2-VASc 7 (age 74, gender, HTN, CHF, DM, CAD). Anticoagulation: No bleeding problems.  On [facility]. DM: On Farxiga for heart failure.  Well-controlled without hypoglycemic events.  She is still also taking glipizide.   CKD3b: Renal function is at her baseline.  Continue furosemide 80 mg in the morning and 40 mg in the evening. HLP: All lipid parameters are in desirable range.  LDL 61. Hypothyroidism: Clinically euthyroid on her current dose of levothyroxine supplement.  TSH in normal range. Anemia: Borderline anemia with hemoglobin 11.6, unchanged over the last 12 months.  Normocytic, normochromic. COPD: Last saw her pulmonary specialist in August 2023.  The cough does not bother her as much right now.  Previous PFTs were quite abnormal, but the repeat PFTs showed minimal abnormalities (FEV1 72% of predicted, FVC 95% of predicted, no change with bronchodilators, some air trapping).  Diffusion abnormalities corrected for alveolar volume. OSA/obesity hypoventilation syndrome: Need to figure out why her sleep study has not yet been performed.  Will get in contact with Dr. Celine Mans.    Medication Adjustments/Labs and Tests Ordered: Current medicines are reviewed at length with the patient today.  Concerns regarding medicines are outlined above.  Medication changes, Labs and Tests ordered today are listed in the Patient Instructions below. Patient Instructions  Medication Instructions:  No  changes *If you need a refill on your cardiac medications before your next appointment, please call your pharmacy*   Lab Work: Lipid, A1C, CMET, TSH- today If you have labs (blood work) drawn today and your tests are completely normal, you will receive your results only by: MyChart Message (if you have MyChart) OR A paper copy in the mail If you have any lab test that is abnormal  or we need to change your treatment, we will call you to review the results.  Follow-Up: At [facility], you and your health needs are our priority.  As part of our continuing mission to provide you with exceptional heart care, we have created designated Provider Care Teams.  These Care Teams include your primary Cardiologist (physician) and Advanced Practice Providers (APPs -  Physician Assistants and Nurse Practitioners) who all work together to provide you with the care you need, when you need it.  We recommend signing up for the patient portal called "MyChart".  Sign up information is provided on this After Visit Summary.  MyChart is used to connect with patients for Virtual Visits (Telemedicine).  Patients are able to view lab/test results, encounter notes, upcoming appointments, etc.  Non-urgent messages can be sent to your provider as well.   To learn more about what you can do with MyChart, go to [email or web address].    Your next appointment:   6 month(s)- PHYS PACER CK  Provider:   Device check     Signed, Tonya Fair, MD  10/03/2022 2:38 PM    [facility] [address] [place], [place], [place]  [address] Phone: [phone]; Fax: [phone]

## 2022-10-02 LAB — LIPID PANEL
Chol/HDL Ratio: 2.3 ratio (ref 0.0–4.4)
Cholesterol, Total: 130 mg/dL (ref 100–199)
HDL: 56 mg/dL (ref >39)
LDL Chol Calc (NIH): 61 mg/dL (ref 0–99)
Triglycerides: 58 mg/dL (ref 0–149)
VLDL Cholesterol Cal: 13 mg/dL (ref 5–40)

## 2022-10-02 LAB — COMPREHENSIVE METABOLIC PANEL
ALT: 13 IU/L (ref 0–32)
AST: 18 IU/L (ref 0–40)
Albumin/Globulin Ratio: 1.7 (ref 1.2–2.2)
Albumin: 3.9 g/dL (ref 3.8–4.8)
Alkaline Phosphatase: 118 IU/L (ref 44–121)
BUN/Creatinine Ratio: 26 (ref 12–28)
BUN: 40 mg/dL — ABNORMAL HIGH (ref 8–27)
Bilirubin Total: 0.4 mg/dL (ref 0.0–1.2)
CO2: 26 mmol/L (ref 20–29)
Calcium: 9.2 mg/dL (ref 8.7–10.3)
Chloride: 102 mmol/L (ref 96–106)
Creatinine, Ser: 1.51 mg/dL — ABNORMAL HIGH (ref 0.57–1.00)
Globulin, Total: 2.3 g/dL (ref 1.5–4.5)
Glucose: 104 mg/dL — ABNORMAL HIGH (ref 70–99)
Potassium: 5 mmol/L (ref 3.5–5.2)
Sodium: 144 mmol/L (ref 134–144)
Total Protein: 6.2 g/dL (ref 6.0–8.5)
eGFR: 36 mL/min/{1.73_m2} — ABNORMAL LOW (ref >59)

## 2022-10-02 LAB — HEMOGLOBIN A1C
Est. average glucose Bld gHb Est-mCnc: 140 mg/dL
Hgb A1c MFr Bld: 6.5 % — ABNORMAL HIGH (ref 4.8–5.6)

## 2022-10-02 LAB — TSH: TSH: 4.28 u[IU]/mL (ref 0.450–4.500)

## 2022-10-03 ENCOUNTER — Other Ambulatory Visit: Payer: Self-pay | Admitting: Internal Medicine

## 2022-10-03 NOTE — Progress Notes (Unsigned)
Needs home sleep test ordered

## 2022-10-04 ENCOUNTER — Telehealth: Payer: Self-pay

## 2022-10-04 NOTE — Telephone Encounter (Signed)
Revied the following message from Dr. Celine Mans,  Charlott Holler, MD  You16 hours ago (4:36 PM)    Please order home sleep study and call patient to schedule follow appt with me. Thanks!   Charlott Holler, MD16 hours ago (4:36 PM)    Needs home sleep test ordered    Spoke with pt you states she does not want to do a HST at this time. Risk, benefits and test were explained to pt by RN and pt still refused HST. Nothing further needed at this time.  Routing to Dr. Celine Mans as Lorain Childes

## 2022-10-10 ENCOUNTER — Telehealth: Payer: Self-pay | Admitting: Cardiovascular Disease

## 2022-10-10 NOTE — Telephone Encounter (Signed)
When we last met in the office she still had evidence of too much fluid on board so I do not think we can cut back on the furosemide, but we can try taking the whole 120 mg once daily, so that it wears off in the second half of the day.

## 2022-10-10 NOTE — Telephone Encounter (Signed)
Patient ask for lab results to be discussed.  She ask specifically for A1C and with result ask if she can stop her insulin now.  Advised she cannot, that she needs to continue as ordered and she states agreement.  She ask for labs to be sent to [facility] at fax [phone].   She also ask for Lasix to be changed . Currently taking Lasix 80 In the AM and 40 at 11:30. She ask can the noon dose be stopped as "I have to go to the bathroom all the time".  Patient states continued edema in legs.  She states they just stopped the wraps to her legs today.  Labs faxed.

## 2022-10-10 NOTE — Telephone Encounter (Signed)
Follow Up:      Patient says she would like to have her last lab results please. She also said she would like for her lab results be sent to [facility] please.

## 2022-10-11 MED ORDER — FUROSEMIDE 40 MG PO TABS: ORAL_TABLET | ORAL | 3 refills | Status: DC

## 2022-10-11 NOTE — Telephone Encounter (Signed)
Call to patient and let her know of the changes.  Faxed over New order to facility as well.

## 2022-12-24 ENCOUNTER — Telehealth: Payer: Self-pay | Admitting: Cardiovascular Disease

## 2022-12-24 NOTE — Telephone Encounter (Signed)
Called patient, she states she is questioning if she can cut back further on the Lasix, she does take the 3 tablets daily in the AM currently, she would like to go to taking 2 tablets in the morning. She is using the bathroom all the time now and states she does not have any swelling, weight gain, she is sob but she has COPD that contributes to this. I advised we would have to check with MD to advise further.   Patient also states she is unsure if she needs to be on Lantus, she is aware that PCP handles this medication however she would like to know what Dr.C thinks. She states her facility [facility]) is who she sees for her PCP and recently changed providers, she states this new provider does not really know her. She states her blood sugar readings are between 89, 86 in the morning. I did advise she should mention this to them however she request a message be sent to MD.   Thanks!

## 2022-12-24 NOTE — Telephone Encounter (Signed)
Hard to advise changing the lasix dose by phone, but if she does not have swelling or shortness of breath and weight is under 215 lb we can try dropping the furosemide to 2 tabs once daily. But need to monitor weight daily and go up on the furosemide again if she gains more than 3-5 lb of fluid. The insulin should indeed be left up to her PCP. Judging by the recent labs with A1c of 6.5% her glucose control is just right and she should continue the Lantus. If she loses true weight (not water weight), we could probably cut that back.

## 2022-12-24 NOTE — Telephone Encounter (Signed)
Called patient, advised of note per MD. Will route message over to facility to make them aware of when they can adjust medications.   Patient verbalized understanding.   Faxed over to [phone]

## 2022-12-24 NOTE — Telephone Encounter (Signed)
Pt c/o medication issue:  1. Name of Medication: Furosemide  2. How are you currently taking this medication (dosage and times per day)?   3. Are you having a reaction (difficulty breathing--STAT)?   4. What is your medication issue? Patient wants to know if she cut down on her Furosemide, she says she is  going to the bathroom too much.      Pt c/o medication issue:  1. Name of Medication: Lantus  2. How are you currently taking this medication (dosage and times per day)?   3. Are you having a reaction (difficulty breathing--STAT)?   4. What is your medication issue? Patient says her other doctor put her on the Lantus. She wants to know if Dr C, thinks that she needs to be on it?

## 2023-01-04 ENCOUNTER — Telehealth: Payer: Self-pay | Admitting: Cardiovascular Disease

## 2023-01-04 NOTE — Telephone Encounter (Signed)
Patient is at facility [facility] She state they did not receive a fax to reduce her Lasix to 80mg  from the 120 mg dose.  Per message from Dr Royann Shivers,. The medication cannot be reduced but she could take her full dose in the morning rather than split it between two doses .  She states she must have misunderstood as she thought it was going to be reduced.  Nothing further needed at this time.

## 2023-01-04 NOTE — Telephone Encounter (Signed)
Pt c/o medication issue:  1. Name of Medication: Furosemide  2. How are you currently taking this medication (dosage and times per day)?   3. Are you having a reaction (difficulty breathing--STAT)?   4. What is your medication issue? Patient said Dr C changed her dose of Furosemide- she said the facility  that she stays, needs a new order. They will not give her the new dose- please fax to [phone]

## 2023-01-23 ENCOUNTER — Telehealth: Payer: Self-pay | Admitting: Cardiovascular Disease

## 2023-01-23 NOTE — Telephone Encounter (Signed)
I spoke with daughter per DPR and made her aware that [facility] was requesting recent labs be sent to them.   She stated it was ok to fax labs to facility.  I faxed labs and received confirmation that labs went through.

## 2023-01-23 NOTE — Telephone Encounter (Signed)
Spoke with daughter per DPR. She states the facility her mom livs in has still been giving her 10 units of insulin and they feel she does not need 10 units being that her A1 C is down to 6.3.  She states she doesn't like how the insulin is making her feel.   On 8/12 previous encounter from patient Dr. Royann Shivers stated she needed to discuss insulin changes with PCP.   Daughter states she is aware but the facility was stating they never received a fax with labs from our office.   On that same encounter Nurse Raynelle Fanning did state she faxed new orders to facility.  I offered to refax and daughter stated "Don't resend them anything. Im moving her from the facility.

## 2023-01-23 NOTE — Telephone Encounter (Signed)
Marena Chancy from [facility] called to request that we send the last lab/blood work of patient. Please fax to [phone] ATTN: Marena Chancy

## 2023-01-23 NOTE — Telephone Encounter (Signed)
Patient's daughter is requesting all back to go over past medication changes and faxes that have been sent to her mother's facility. Requesting call back.

## 2023-03-13 ENCOUNTER — Telehealth: Payer: Self-pay | Admitting: Cardiovascular Disease

## 2023-03-13 NOTE — Telephone Encounter (Signed)
Pt states her legs are touring and wants to know what to do. Please advise

## 2023-03-13 NOTE — Telephone Encounter (Signed)
Patient states he legs are "weeping". Patients lives in assisted living facility and states she has sores on both legs that are being treated by the providers there. I recommended she continue to followup with providers at facility as they see her daily and are up to date on her care needs. Pt verbalized understanding.

## 2023-03-14 LAB — BASIC METABOLIC PANEL: EGFR: 29

## 2023-03-27 ENCOUNTER — Encounter: Payer: Self-pay | Admitting: Gastroenterology

## 2023-04-01 LAB — LAB REPORT - SCANNED: EGFR: 31

## 2023-04-02 ENCOUNTER — Encounter: Payer: Self-pay | Admitting: Gastroenterology

## 2023-04-02 ENCOUNTER — Ambulatory Visit (INDEPENDENT_AMBULATORY_CARE_PROVIDER_SITE_OTHER): Payer: 59 | Admitting: Gastroenterology

## 2023-04-02 VITALS — BP 124/60 | HR 60 | Ht 60.0 in | Wt 239.0 lb

## 2023-04-02 DIAGNOSIS — D649 Anemia, unspecified: Secondary | ICD-10-CM | POA: Diagnosis not present

## 2023-04-02 DIAGNOSIS — Z7902 Long term (current) use of antithrombotics/antiplatelets: Secondary | ICD-10-CM

## 2023-04-02 DIAGNOSIS — R195 Other fecal abnormalities: Secondary | ICD-10-CM

## 2023-04-02 NOTE — Progress Notes (Signed)
[facility] Consult Note:  History: Tonya Glass 04/02/2023  Referring provider: Joycelyn Man, NP  Reason for consult/chief complaint: Anemia (Pt is confused and stating that she doest know why she is here, she hasn't seen any blood in stools) and Blood In Stools   Subjective  Prior history:  Tonya Glass was seen at this office in consultation by Dr. Adela Lank in September 2017 for positive fit test, and a colonoscopy was recommended. Colonoscopy by Dr. Adela Lank 02/01/2016 to the terminal ileum had an excellent preparation and only a diminutive rectal hyperplastic polyp was removed with no other significant findings. _____________________________________  Tonya Glass was sent here by her healthcare providers at [facility] nursing facility (Dr.Sheikh Tejan-Sic and Kelli Hope, NP).  The only information I have is what is listed on our clinic schedule today indicating "anemia and blood in stool", which was presumably the clinical history taken by our scheduling staff.  Unfortunately, Tonya Glass has no idea why she has come to our clinic today, and in fact thought she was seeing her cardiologist.  She is a limited historian and seems to have difficulty with her memory, so there was little information I could gather.  Even more unfortunate is that there were no clinical records sent to our clinic.  There is no referral nor notes scanned in the media section of this EHR.  Our clinical staff contacted her facility requesting this information, and we received a partially legible form for consultation with nothing listed under reason for referral, no labs or other clinical notes.  She is not accompanied by any family, and there was a transport individual who waited in the [place] for her today.  Tonya Glass says she has a daughter who lives in [place] who can come to healthcare appointments "if I ask her to".  Tonya Glass denies dysphagia odynophagia, nausea or vomiting.  She describes  what sounds like early satiety ever since a gastric sleeve procedure which, according to her records noted below, was done in [place] in 2009.  She reports to me that she has not seen any blood in her bowel movements, denies abdominal pain, says her bowel movements are regular.  (Some additional records came by fax after the patient had left the office today-see below) ROS:  Review of Systems She denies exertional chest pain She has chronic dyspnea and a productive cough that she attributes to her COPD Mobility is limited (she is in a wheelchair today) Chronic arthritic pain Anxiety  Past Medical History: Past Medical History:  Diagnosis Date   Anxiety    Aortic valve stenosis 10/10/2016   Arthritis    "fingers" (07/24/2017)   CHF (congestive heart failure) (HCC)    COPD (chronic obstructive pulmonary disease) (HCC)    Depression    Gallstones    Heart murmur    High cholesterol    History of kidney stones    Hypertension    Hypothyroidism    Migraines    "none in years til 01/2013-02/2013; none since"  (07/24/2017)   Orthopnoea    "just since open heart surgery" (02/25/2013)   Pacemaker    [employer]    Pneumonia ~ 2012   "walking pneumonia"   Pneumonia 07/23/2017   Stroke (HCC) 2001   denies residual on 02/25/2013   Symptomatic hypotension 10/10/2016   Syncope and collapse    "prior to open heart surgery" (02/25/2013)   Type II diabetes mellitus [facility])    From cardiology office note May 2024:  Tonya Glass  is a 77 y.o. 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  She had paroxysmal atrial flutter and underwent successful overdrive  pacing in 2021, with recurrence in January 2022 that resolved spontaneously.  She has minor CAD by angiography (40% mid LAD pre-AVR 2014; mild diffuse nonobstructive irregularity by angiography 2023).       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  TEE, CT angiography and aortic angiography also showed moderate aortic insufficiency with a perivalvular leak in the vicinity of the left coronary ostium.     Mitral regurgitation, which appeared moderate-severe by transthoracic echo, was much less significant at the time of the TEE, suggesting a secondary/functional mechanism.     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   She is a full-time resident at [employer].  Leg edema is fairly well-controlled with a combination of elastic wraps and diuretics.  She does not currently have any ulcers/weeping wounds.  She habitually sleeps in a recliner due to problems with tailbone pain.  Has not had overt orthopnea or PND and is quite sedentary, so is difficult to assess her functional status.  She has not had this pain, palpitations, dizziness, syncope or focal neurological complaints.  Denies bleeding.  Past Surgical History: Past Surgical History:  Procedure Laterality Date   AORTIC ARCH ANGIOGRAPHY N/A 08/07/2021   Procedure: AORTIC ARCH ANGIOGRAPHY;  Surgeon: Tonny Bollman, MD;  Location: [facility] INVASIVE CV LAB;  Service:  Cardiovascular;  Laterality: N/A;   BREAST SURGERY Right    "clamped blood vessel and stitched it up; in [place]"   CARDIAC CATHETERIZATION     CARDIAC VALVE REPLACEMENT  01/26/2013   "cow valve put in; widened the second valve" (02/25/2013) - [place], [place]   CATARACT EXTRACTION W/ INTRAOCULAR LENS  IMPLANT, BILATERAL Bilateral 04/2017   CHOLECYSTECTOMY OPEN  1971   CYST EXCISION     "taken off my chest"   INSERT / REPLACE / REMOVE PACEMAKER  01/19/2013   [employer] - dual chamber   LAPAROSCOPIC GASTRIC SLEEVE RESECTION  2009   in [place]   PPM GENERATOR CHANGEOUT N/A 06/22/2022   Procedure: PPM GENERATOR CHANGEOUT;  Surgeon: Duke Salvia, MD;  Location: [facility] INVASIVE CV LAB;  Service: Cardiovascular;  Laterality: N/A;   RIGHT/LEFT HEART CATH AND CORONARY ANGIOGRAPHY N/A 08/07/2021   Procedure: RIGHT/LEFT HEART CATH AND CORONARY ANGIOGRAPHY;  Surgeon: Tonny Bollman, MD;  Location: [facility] INVASIVE CV LAB;  Service: Cardiovascular;  Laterality: N/A;   TEE WITHOUT CARDIOVERSION N/A 06/13/2021   Procedure: TRANSESOPHAGEAL ECHOCARDIOGRAM (TEE);  Surgeon: Thurmon Fair, MD;  Location: [facility] ENDOSCOPY;  Service: Cardiovascular;  Laterality: N/A;   VEIN SURGERY Left    LLE; cut leg open and took out piece of vein that was  protruding thru skin; stitched it up; done in [place]     Family History: Family History  Problem Relation Age of Onset   Lung cancer Mother    Heart Problems Father    Hypertension Brother    Colon polyps Brother    Skin cancer Brother    Asthma Brother    Colon polyps Daughter    Diabetes Maternal Aunt    Breast cancer Maternal Aunt        mets to liver   Diabetes Paternal Aunt     Social History: Social History   Socioeconomic History   Marital status: Widowed    Spouse name: Not on file   Number of children: 2   Years of education: dental sch   Highest education level: Not on file  Occupational History   Occupation: retired  Tobacco Use   Smoking status:  Former    Current packs/day: 0.00    Average packs/day: 2.0 packs/day for 40.0 years (80.0 ttl pk-yrs)    Types: Cigarettes    Start date: 05/29/1959    Quit date: 05/29/1999    Years since quitting: 23.8   Smokeless tobacco: Never  Vaping Use   Vaping status: Never Used  Substance and Sexual Activity   Alcohol use: No   Drug use: No   Sexual activity: Yes  Other Topics Concern   Not on file  Social History Narrative   Not on file   Social Determinants of Health   Financial Resource Strain: Not on file  Food Insecurity: No Food Insecurity (06/22/2022)   Hunger Vital Sign    Worried About Running Out of Food in the Last Year: Never true    Ran Out of Food in the Last Year: Never true  Transportation Needs: No Transportation Needs (06/22/2022)   PRAPARE - Administrator, Civil Service (Medical): No    Lack of Transportation (Non-Medical): No  Physical Activity: Not on file  Stress: Not on file  Social Connections: Unknown (09/25/2021)   [facility]   Social Network    Social Network: Not on file    Allergies: No Known Allergies  Outpatient Meds: Current Outpatient Medications  Medication Sig Dispense Refill   acetaminophen (TYLENOL) 650 MG CR tablet Take 650 mg by mouth every 8 (eight) hours as needed (Discomfort).     albuterol (PROVENTIL HFA;VENTOLIN HFA) 108 (90 BASE) MCG/ACT inhaler Inhale 2 puffs into the lungs every 6 (six) hours as needed (COPD).     albuterol (PROVENTIL) (2.5 MG/3ML) 0.083% nebulizer solution Take 2.5 mg by nebulization every 12 (twelve) hours as needed for wheezing or shortness of breath.     alendronate (FOSAMAX) 70 MG tablet Take 70 mg by mouth every Sunday.     apixaban (ELIQUIS) 5 MG TABS tablet Take 1 tablet (5 mg total) by mouth 2 (two) times daily. 180 tablet 1   atorvastatin (LIPITOR) 40 MG tablet Take 40 mg by mouth daily.     budesonide-formoterol (SYMBICORT) 160-4.5 MCG/ACT inhaler Inhale 2 puffs  into the lungs 2 (two) times daily.     carvedilol (COREG) 3.125 MG tablet Take 1 tablet (3.125 mg total) by mouth 2 (two) times daily.     cloNIDine (CATAPRES) 0.2 MG tablet Take 1 tablet (0.2 mg total) by mouth at bedtime. 30 tablet 11   cyanocobalamin (VITAMIN B12) 500 MCG tablet Take 500 mcg by mouth daily.     dapagliflozin propanediol (FARXIGA) 10 MG TABS tablet Take  1 tablet (10 mg total) by mouth daily before breakfast. 30 tablet 4   Ferrous Sulfate (IRON) 325 (65 Fe) MG TABS Take 325 mg by mouth daily.  0   FLUoxetine (PROZAC) 40 MG capsule Take 40 mg by mouth daily.      furosemide (LASIX) 40 MG tablet Take 3 tablets to equal 120 mg Daily, in the morning. 90 tablet 3   gabapentin (NEURONTIN) 300 MG capsule Take 600 mg by mouth at bedtime.     glipiZIDE (GLUCOTROL) 10 MG tablet Take 10 mg by mouth 2 (two) times daily before a meal.     ipratropium (ATROVENT) 0.06 % nasal spray Place 2 sprays into both nostrils 3 (three) times daily. (Patient taking differently: Place 2 sprays into both nostrils every 8 (eight) hours as needed (Allergies).) 15 mL 12   LANTUS 100 UNIT/ML injection Inject 10 Units into the skin daily.     levocetirizine (XYZAL) 5 MG tablet Take 5 mg by mouth at bedtime.     levothyroxine (SYNTHROID, LEVOTHROID) 50 MCG tablet Take 50 mcg by mouth daily before breakfast.     magnesium oxide (MAG-OX) 400 (240 Mg) MG tablet Take 400 mg by mouth daily.     nystatin ointment (MYCOSTATIN) Apply 1 application  topically daily as needed (rash). 100,000- units/gram     olmesartan (BENICAR) 20 MG tablet Take 0.5 tablets (10 mg total) by mouth daily. (Patient taking differently: Take 20 mg by mouth daily.) 45 tablet 1   omeprazole (PRILOSEC) 40 MG capsule Take 1 capsule (40 mg total) by mouth daily. 30 capsule 5   sodium chloride (OCEAN) 0.65 % SOLN nasal spray Place 2 sprays into both nostrils 2 (two) times daily.     temazepam (RESTORIL) 30 MG capsule Take 30 mg by mouth at bedtime.      traMADol (ULTRAM) 50 MG tablet Take 50 mg by mouth as needed.     traZODone (DESYREL) 50 MG tablet Take 25 mg by mouth at bedtime.     Vitamins A & D (VITAMIN A & D) ointment Apply 1 Application topically 3 (three) times daily. Additional every 8 hours for skin breakdown     No current facility-administered medications for this visit.      ___________________________________________________________________ Objective   Exam:  BP 124/60 (BP Location: Left Wrist, Patient Position: Sitting, Cuff Size: Normal)   Pulse 60   Ht 5' (1.524 m)   Wt 239 lb (108.4 kg)   BMI 46.68 kg/m  Wt Readings from Last 3 Encounters:  04/02/23 239 lb (108.4 kg)  10/01/22 220 lb 6.4 oz (100 kg)  06/22/22 209 lb 10.5 oz (95.1 kg)   Note weight increased from last clinical encounter in this EHR She is alert and conversational, limited historian as noted above.  She has difficulty recalling time frames of various testing and procedures.  She also is not certain how long it has been since she has lived independently.  Exam limited since she is in a wheelchair Eyes: sclera anicteric, no redness ENT: oral mucosa moist without lesions, no cervical or supraclavicular lymphadenopathy CV: Heart sounds distant and difficult to auscultate, + marked bilateral peripheral edema with Ace bandage wraps Resp: Breathing comfortably on room air, globally decreased breath sounds bilaterally, no wheezing.  Congested cough during the visit, which she says is typical for her GI: soft, obese, no tenderness, with active bowel sounds. No guarding or palpable organomegaly noted.  (Limited by positioning and body habitus) Skin; no pallor, warm  and dry, no rash or jaundice noted   Labs:  Labs in this EHR showing intermittent normocytic anemia to variable degrees dating back to at least 2019.  Hemoglobin 11.6 with normal MCV in this EHR February 2024  Eventual lab report received from her long-term care facility dated  03/14/2023 includes a creatinine of 2.26, calcium 8.4, glucose 107, BUN 47.5, remainder BMP normal.  No additional labs accompany this.  There was also a long list of her medication administration record and no other clinical notes.  Encounter Diagnoses  Name Primary?   Anemia, unspecified type Yes   Positive FIT (fecal immunochemical test)    Long term (current) use of antithrombotics/antiplatelets     Assessment and Plan  Reported anemia, though no supporting documentation accompanied this patient to the visit today.  We have no clinical information regarding the reported blood in the stool. This patient is a limited historian with multiple chronic medical issues, and she was sent to our office today with insufficient care support for the consultation requested.  She was naturally concerned about blood in the stool since she seem to know nothing about this.  I am afraid I cannot give her any more information about it for the reasons noted above.  She does not seem to have seen in her blood in her stool to the extent she is aware of it.  She appears to have CKD which would seem to be a likely contributing factor to any anemia she might have, but we do not even know what her hemoglobin is.  When she asked what she had been sent, I told her I suspect it was for the question of whether endoscopic procedures were warranted to evaluate a source of blood loss that may be contributing to anemia.  She then stated quite clearly she has no intention of ever undergoing any further endoscopic procedures.  She would clearly be at high risk for complications of endoscopic procedures given her medical comorbidities.   Plan:  This note will be sent back to her referring providers at her facility.  If circumstances change and this patient may be amenable to endoscopic procedures, then she should be referred back to see Dr. Adela Lank.  However, in that instance, an appointment should only made if sufficient  supporting documentation accompanies a referral and the referring provider contacts Dr. Adela Lank first so we can avoid a similar situation to what occurred today.  Thank you for the courtesy of this consult.  Please call me with any questions or concerns.   40 minutes were spent on this encounter (including chart review, history/exam, counseling/coordination of care, and documentation) > 50% of that time was spent on counseling and coordination of care.   Charlie Pitter III  CC: Referring provider noted above

## 2023-04-04 ENCOUNTER — Encounter: Payer: Self-pay | Admitting: Cardiovascular Disease

## 2023-04-04 ENCOUNTER — Ambulatory Visit: Payer: 59 | Attending: Cardiovascular Disease | Admitting: Cardiovascular Disease

## 2023-04-04 VITALS — BP 110/62 | HR 60 | Ht 60.0 in | Wt 240.0 lb

## 2023-04-04 DIAGNOSIS — I2510 Atherosclerotic heart disease of native coronary artery without angina pectoris: Secondary | ICD-10-CM | POA: Diagnosis not present

## 2023-04-04 DIAGNOSIS — E1122 Type 2 diabetes mellitus with diabetic chronic kidney disease: Secondary | ICD-10-CM

## 2023-04-04 DIAGNOSIS — I495 Sick sinus syndrome: Secondary | ICD-10-CM

## 2023-04-04 DIAGNOSIS — E039 Hypothyroidism, unspecified: Secondary | ICD-10-CM

## 2023-04-04 DIAGNOSIS — I441 Atrioventricular block, second degree: Secondary | ICD-10-CM

## 2023-04-04 DIAGNOSIS — I2781 Cor pulmonale (chronic): Secondary | ICD-10-CM

## 2023-04-04 DIAGNOSIS — D649 Anemia, unspecified: Secondary | ICD-10-CM

## 2023-04-04 DIAGNOSIS — I50813 Acute on chronic right heart failure: Secondary | ICD-10-CM

## 2023-04-04 DIAGNOSIS — I5032 Chronic diastolic (congestive) heart failure: Secondary | ICD-10-CM | POA: Diagnosis not present

## 2023-04-04 DIAGNOSIS — I351 Nonrheumatic aortic (valve) insufficiency: Secondary | ICD-10-CM

## 2023-04-04 DIAGNOSIS — E785 Hyperlipidemia, unspecified: Secondary | ICD-10-CM

## 2023-04-04 DIAGNOSIS — J449 Chronic obstructive pulmonary disease, unspecified: Secondary | ICD-10-CM

## 2023-04-04 DIAGNOSIS — I484 Atypical atrial flutter: Secondary | ICD-10-CM

## 2023-04-04 DIAGNOSIS — G4733 Obstructive sleep apnea (adult) (pediatric): Secondary | ICD-10-CM

## 2023-04-04 DIAGNOSIS — I10 Essential (primary) hypertension: Secondary | ICD-10-CM

## 2023-04-04 DIAGNOSIS — D6869 Other thrombophilia: Secondary | ICD-10-CM

## 2023-04-04 DIAGNOSIS — I872 Venous insufficiency (chronic) (peripheral): Secondary | ICD-10-CM

## 2023-04-04 DIAGNOSIS — N1832 Chronic kidney disease, stage 3b: Secondary | ICD-10-CM

## 2023-04-04 DIAGNOSIS — Z950 Presence of cardiac pacemaker: Secondary | ICD-10-CM

## 2023-04-04 DIAGNOSIS — Z9889 Other specified postprocedural states: Secondary | ICD-10-CM

## 2023-04-04 DIAGNOSIS — Z953 Presence of xenogenic heart valve: Secondary | ICD-10-CM

## 2023-04-04 MED ORDER — TORSEMIDE 100 MG PO TABS: 50.0000 mg | ORAL_TABLET | Freq: Two times a day (BID) | ORAL | 3 refills | Status: DC

## 2023-04-04 NOTE — Patient Instructions (Signed)
Medication Instructions:  Increase Torsemide to 50 mg twice a day *If you need a refill on your cardiac medications before your next appointment, please call your pharmacy*  Call if weight is still over 230 pounds by December 1st   Lab Work: BMP, BNP- draw at the facility on Tuesday 04/09/23- fax results to [phone] If you have labs (blood work) drawn today and your tests are completely normal, you will receive your results only by: MyChart Message (if you have MyChart) OR A paper copy in the mail If you have any lab test that is abnormal or we need to change your treatment, we will call you to review the results.  Daily Weights   Low-Sodium Eating Plan Salt (sodium) helps you keep a healthy balance of fluids in your body. Too much sodium can raise your blood pressure. It can also cause fluid and waste to be held in your body. Your health care provider or dietitian may recommend a low-sodium eating plan if you have high blood pressure (hypertension), kidney disease, liver disease, or heart failure. Eating less sodium can help lower your blood pressure and reduce swelling. It can also protect your heart, liver, and kidneys. What are tips for following this plan? Reading food labels  Check food labels for the amount of sodium per serving. If you eat more than one serving, you must multiply the listed amount by the number of servings. Choose foods with less than 140 milligrams (mg) of sodium per serving. Avoid foods with 300 mg of sodium or more per serving. Always check how much sodium is in a product, even if the label says "unsalted" or "no salt added." Shopping  Buy products labeled as "low-sodium" or "no salt added." Buy fresh foods. Avoid canned foods and pre-made or frozen meals. Avoid canned, cured, or processed meats. Buy breads that have less than 80 mg of sodium per slice. Cooking  Eat more home-cooked food. Try to eat less restaurant, buffet, and fast food. Try not  to add salt when you cook. Use salt-free seasonings or herbs instead of table salt or sea salt. Check with your provider or pharmacist before using salt substitutes. Cook with plant-based oils, such as canola, sunflower, or olive oil. Meal planning When eating at a restaurant, ask if your food can be made with less salt or no salt. Avoid dishes labeled as brined, pickled, cured, or smoked. Avoid dishes made with soy sauce, miso, or teriyaki sauce. Avoid foods that have monosodium glutamate (MSG) in them. MSG may be added to some restaurant food, sauces, soups, bouillon, and canned foods. Make meals that can be grilled, baked, poached, roasted, or steamed. These are often made with less sodium. General information Try to limit your sodium intake to 1,500-2,300 mg each day, or the amount told by your provider. What foods should I eat? Fruits Fresh, frozen, or canned fruit. Fruit juice. Vegetables Fresh or frozen vegetables. "No salt added" canned vegetables. "No salt added" tomato sauce and paste. Low-sodium or reduced-sodium tomato and vegetable juice. Grains Low-sodium cereals, such as oats, puffed wheat and rice, and shredded wheat. Low-sodium crackers. Unsalted rice. Unsalted pasta. Low-sodium bread. Whole grain breads and whole grain pasta. Meats and other proteins Fresh or frozen meat, poultry, seafood, and fish. These should have no added salt. Low-sodium canned tuna and salmon. Unsalted nuts. Dried peas, beans, and lentils without added salt. Unsalted canned beans. Eggs. Unsalted nut butters. Dairy Milk. Soy milk. Cheese that is naturally low in sodium, such as  ricotta cheese, fresh mozzarella, or Swiss cheese. Low-sodium or reduced-sodium cheese. Cream cheese. Yogurt. Seasonings and condiments Fresh and dried herbs and spices. Salt-free seasonings. Low-sodium mustard and ketchup. Sodium-free salad dressing. Sodium-free light mayonnaise. Fresh or refrigerated horseradish. Lemon juice.  Vinegar. Other foods Homemade, reduced-sodium, or low-sodium soups. Unsalted popcorn and pretzels. Low-salt or salt-free chips. The items listed above may not be all the foods and drinks you can have. Talk to a dietitian to learn more. What foods should I avoid? Vegetables Sauerkraut, pickled vegetables, and relishes. Olives. [place] fries. Onion rings. Regular canned vegetables, except low-sodium or reduced-sodium items. Regular canned tomato sauce and paste. Regular tomato and vegetable juice. Frozen vegetables in sauces. Grains Instant hot cereals. Bread stuffing, pancake, and biscuit mixes. Croutons. Seasoned rice or pasta mixes. Noodle soup cups. Boxed or frozen macaroni and cheese. Regular salted crackers. Self-rising flour. Meats and other proteins Meat or fish that is salted, canned, smoked, spiced, or pickled. Precooked or cured meat, such as sausages or meat loaves. Tomasa Blase. Ham. Pepperoni. Hot dogs. Corned beef. Chipped beef. Salt pork. Jerky. Pickled herring, anchovies, and sardines. Regular canned tuna. Salted nuts. Dairy Processed cheese and cheese spreads. Hard cheeses. Cheese curds. Blue cheese. Feta cheese. String cheese. Regular cottage cheese. Buttermilk. Canned milk. Fats and oils Salted butter. Regular margarine. Ghee. Bacon fat. Seasonings and condiments Onion salt, garlic salt, seasoned salt, table salt, and sea salt. Canned and packaged gravies. Worcestershire sauce. Tartar sauce. Barbecue sauce. Teriyaki sauce. Soy sauce, including reduced-sodium soy sauce. Steak sauce. Fish sauce. Oyster sauce. Cocktail sauce. Horseradish that you find on the shelf. Regular ketchup and mustard. Meat flavorings and tenderizers. Bouillon cubes. Hot sauce. Pre-made or packaged marinades. Pre-made or packaged taco seasonings. Relishes. Regular salad dressings. Salsa. Other foods Salted popcorn and pretzels. Corn chips and puffs. Potato and tortilla chips. Canned or dried soups. Pizza. Frozen  entrees and pot pies. The items listed above may not be all the foods and drinks you should avoid. Talk to a dietitian to learn more. This information is not intended to replace advice given to you by your health care provider. Make sure you discuss any questions you have with your health care provider. Document Revised: 05/17/2022 Document Reviewed: 05/17/2022 Elsevier Patient Education  2024 Elsevier Inc.   Follow-Up: At [facility], you and your health needs are our priority.  As part of our continuing mission to provide you with exceptional heart care, we have created designated Provider Care Teams.  These Care Teams include your primary Cardiologist (physician) and Advanced Practice Providers (APPs -  Physician Assistants and Nurse Practitioners) who all work together to provide you with the care you need, when you need it.  We recommend signing up for the patient portal called "MyChart".  Sign up information is provided on this After Visit Summary.  MyChart is used to connect with patients for Virtual Visits (Telemedicine).  Patients are able to view lab/test results, encounter notes, upcoming appointments, etc.  Non-urgent messages can be sent to your provider as well.   To learn more about what you can do with MyChart, go to [email or web address].    Your next appointment:   [facility] 4 WEEKS  Dr Royann Shivers in 4-5 months

## 2023-04-04 NOTE — Progress Notes (Signed)
Cardiology Office Note    Date:  04/06/2023   ID:  Tonya Glass, DOB 1945/12/26, MRN [id number]  PCP:  No primary care provider on file.  Cardiologist:  Asencion Gowda, M.D.; Thurmon Fair, MD   Chief complaint: CHF  History of Present Illness:  Tonya Glass is a 77 y.o. 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  She had paroxysmal atrial flutter and underwent successful overdrive pacing in 2021, with recurrence in January 2022 that resolved spontaneously.  She has minor CAD by angiography (40% mid LAD pre-AVR 2014; mild diffuse nonobstructive irregularity by angiography 2023).     Tonya Glass's memory has significantly deteriorated.  She is now in the skilled nursing section of [facility].  Her daughter is with her today and helps fill in the review of systems and history of recent illness.  She has had substantial worsening of her leg edema.  There is a detailed list of her daily weights and she has gained 20 pounds in last 30 days.  In late October, there was a 3-day increase in of her dose of torsemide from 30 mg twice daily to 40 mg twice daily, but she is now back on her standard dose of diuretic.  She is now more than 40 pounds above the weight that she had at the time of her cardiac catheterization last year, when she was close to euvolemic status (196 pounds, PAWP 13 mmHg).  She does not have any significant worsening shortness of breath, but she is quite sedentary.  She does not have orthopnea or PND but typically sleeps in a recliner due to tailbone pain.  She has severe bilateral lower extremity edema, despite elastic bandages and has bilateral weeping  wounds in both calfs.  She has long had findings of right heart failure dominant over any symptoms of left heart failure she has significant daytime hypersomnolence, so there is a strong suspicion that she has untreated obstructive sleep apnea but she has refused sleep studies and will never wear CPAP.  She is morbidly obese with a BMI well over 48 and possibly also has obesity hypoventilation syndrome.  She has not had any recent falls, but remains very unsteady.  She does not move without assistance.  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  TEE, CT angiography and aortic angiography also showed moderate aortic insufficiency with a perivalvular leak in the vicinity of the left coronary ostium.    Mitral regurgitation, which appeared moderate-severe by transthoracic echo, was much less significant at the time of the TEE, suggesting a secondary/functional mechanism.    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   She had an uncomplicated pacemaker generator change out in February 2024 with Dr. Graciela Husbands.  She has  normal pacemaker function with an estimated gentle longevity of 7.5 years.  She has 79% atrial pacing and 98% ventricular pacing.  She has not had episodes of atrial fibrillation with high ventricular rates.  Presenting rhythm today is AV sequential pacing.  She has had atrial flutter recorded on her previous  device and is on chronic anticoagulation with Eliquis.  Labs on 04/01/2023 showed creatinine 2.15, BUN 38, glucose 57, potassium 5.0, CO2 31, sodium 144, TSH 4.53  Past Medical History:  Diagnosis Date   Anxiety    Aortic valve stenosis 10/10/2016   Arthritis    "fingers" (07/24/2017)   CHF (congestive heart failure) (HCC)    COPD (chronic obstructive pulmonary disease) (HCC)    Depression    Gallstones    Heart murmur    High cholesterol    History of kidney stones    Hypertension    Hypothyroidism    Migraines    "none in years til 01/2013-02/2013; none since"  (07/24/2017)   Orthopnoea    "just since open heart surgery" (02/25/2013)   Pacemaker    [employer]    Pneumonia ~ 2012   "walking pneumonia"   Pneumonia 07/23/2017   Stroke (HCC) 2001   denies residual on 02/25/2013   Symptomatic hypotension 10/10/2016   Syncope and collapse    "prior to open heart surgery" (02/25/2013)   Type II diabetes mellitus (HCC)     Past Surgical History:  Procedure Laterality Date   AORTIC ARCH ANGIOGRAPHY N/A 08/07/2021   Procedure: AORTIC ARCH ANGIOGRAPHY;  Glass: Tonny Bollman, MD;  Location: [facility] INVASIVE CV LAB;  Service: Cardiovascular;  Laterality: N/A;   BREAST SURGERY Right    "clamped blood vessel and stitched it up; in [place]"   CARDIAC CATHETERIZATION     CARDIAC VALVE REPLACEMENT  01/26/2013   "cow valve put in; widened the second valve" (02/25/2013) - [place], [place]   CATARACT EXTRACTION W/ INTRAOCULAR LENS  IMPLANT, BILATERAL Bilateral 04/2017   CHOLECYSTECTOMY OPEN  1971   CYST EXCISION     "taken off my chest"   INSERT / REPLACE / REMOVE PACEMAKER  01/19/2013   [employer] - dual chamber   LAPAROSCOPIC GASTRIC SLEEVE RESECTION  2009   in [place]   PPM GENERATOR CHANGEOUT N/A 06/22/2022   Procedure: PPM GENERATOR CHANGEOUT;  Glass: Duke Salvia, MD;  Location: [facility] INVASIVE CV LAB;  Service: Cardiovascular;  Laterality: N/A;   RIGHT/LEFT HEART CATH AND  CORONARY ANGIOGRAPHY N/A 08/07/2021   Procedure: RIGHT/LEFT HEART CATH AND CORONARY ANGIOGRAPHY;  Glass: Tonny Bollman, MD;  Location: [facility] INVASIVE CV LAB;  Service: Cardiovascular;  Laterality: N/A;   TEE WITHOUT CARDIOVERSION N/A 06/13/2021   Procedure: TRANSESOPHAGEAL ECHOCARDIOGRAM (TEE);  Glass: Thurmon Fair, MD;  Location: [facility] ENDOSCOPY;  Service: Cardiovascular;  Laterality: N/A;   VEIN SURGERY Left    LLE; cut leg open and took out piece of vein that was protruding thru skin; stitched it up; done in [place]    Current Medications: Outpatient Medications Prior to Visit  Medication Sig Dispense Refill   albuterol (PROVENTIL) (2.5 MG/3ML) 0.083% nebulizer solution Take 2.5 mg by nebulization every 12 (twelve) hours as needed for wheezing or shortness of breath.     alendronate (FOSAMAX) 70 MG tablet Take 70 mg by mouth every Sunday.     apixaban (ELIQUIS) 5 MG TABS tablet Take 1 tablet (5 mg total) by mouth 2 (two) times daily. 180 tablet 1   atorvastatin (LIPITOR) 40 MG tablet  Take 40 mg by mouth daily.     budesonide-formoterol (SYMBICORT) 160-4.5 MCG/ACT inhaler Inhale 2 puffs into the lungs 2 (two) times daily.     carvedilol (COREG) 3.125 MG tablet Take 1 tablet (3.125 mg total) by mouth 2 (two) times daily.     cloNIDine (CATAPRES) 0.2 MG tablet Take 1 tablet (0.2 mg total) by mouth at bedtime. 30 tablet 11   dapagliflozin propanediol (FARXIGA) 10 MG TABS tablet Take 1 tablet (10 mg total) by mouth daily before breakfast. 30 tablet 4   Ferrous Sulfate (IRON) 325 (65 Fe) MG TABS Take 325 mg by mouth daily.  0   FLUoxetine (PROZAC) 40 MG capsule Take 40 mg by mouth daily.      gabapentin (NEURONTIN) 300 MG capsule Take 600 mg by mouth at bedtime.     glipiZIDE (GLUCOTROL) 10 MG tablet Take 10 mg by mouth 2 (two) times daily before a meal.     HUMALOG KWIKPEN 100 UNIT/ML KwikPen Inject into the skin 2 (two) times daily. Sliding scale     LANTUS SOLOSTAR 100 UNIT/ML Solostar Pen  Inject 12 Units into the skin at bedtime.     levothyroxine (SYNTHROID) 75 MCG tablet Take 75 mcg by mouth daily.     magnesium oxide (MAG-OX) 400 (240 Mg) MG tablet Take 400 mg by mouth daily.     mineral oil-hydrophilic petrolatum (AQUAPHOR) ointment Apply 1 Application topically 3 (three) times daily.     olmesartan (BENICAR) 20 MG tablet Take 0.5 tablets (10 mg total) by mouth daily. (Patient taking differently: Take 20 mg by mouth daily.) 45 tablet 1   pantoprazole (PROTONIX) 40 MG tablet Take 40 mg by mouth 2 (two) times daily.     traZODone (DESYREL) 50 MG tablet Take 25 mg by mouth at bedtime.     torsemide (DEMADEX) 10 MG tablet Take 10 mg by mouth 2 (two) times daily.     torsemide (DEMADEX) 20 MG tablet Take 20 mg by mouth every morning.     acetaminophen (TYLENOL) 650 MG CR tablet Take 650 mg by mouth every 8 (eight) hours as needed (Discomfort). (Patient not taking: Reported on 04/04/2023)     albuterol (PROVENTIL HFA;VENTOLIN HFA) 108 (90 BASE) MCG/ACT inhaler Inhale 2 puffs into the lungs every 6 (six) hours as needed (COPD).     cyanocobalamin (VITAMIN B12) 500 MCG tablet Take 500 mcg by mouth daily.     furosemide (LASIX) 40 MG tablet Take 3 tablets to equal 120 mg Daily, in the morning. 90 tablet 3   ipratropium (ATROVENT) 0.06 % nasal spray Place 2 sprays into both nostrils 3 (three) times daily. (Patient not taking: Reported on 04/04/2023) 15 mL 12   LANTUS 100 UNIT/ML injection Inject 10 Units into the skin daily. (Patient not taking: Reported on 04/04/2023)     levocetirizine (XYZAL) 5 MG tablet Take 5 mg by mouth at bedtime. (Patient not taking: Reported on 04/04/2023)     levothyroxine (SYNTHROID, LEVOTHROID) 50 MCG tablet Take 50 mcg by mouth daily before breakfast. (Patient not taking: Reported on 04/04/2023)     nystatin ointment (MYCOSTATIN) Apply 1 application  topically daily as needed (rash). 100,000- units/gram (Patient not taking: Reported on 04/04/2023)      omeprazole (PRILOSEC) 40 MG capsule Take 1 capsule (40 mg total) by mouth daily. (Patient not taking: Reported on 04/04/2023) 30 capsule 5   PROAIR RESPICLICK 108 (90 Base) MCG/ACT AEPB Inhale 2 puffs into the lungs 4 (four) times daily  as needed. (Patient not taking: Reported on 04/04/2023)     sodium chloride (OCEAN) 0.65 % SOLN nasal spray Place 2 sprays into both nostrils 2 (two) times daily. (Patient not taking: Reported on 04/04/2023)     temazepam (RESTORIL) 30 MG capsule Take 30 mg by mouth at bedtime. (Patient not taking: Reported on 04/04/2023)     traMADol (ULTRAM) 50 MG tablet Take 50 mg by mouth as needed. (Patient not taking: Reported on 04/04/2023)     Vitamins A & D (VITAMIN A & D) ointment Apply 1 Application topically 3 (three) times daily. Additional every 8 hours for skin breakdown (Patient not taking: Reported on 04/04/2023)     No facility-administered medications prior to visit.     Allergies:   Patient has no known allergies.   Social History   Socioeconomic History   Marital status: Widowed    Spouse name: Not on file   Number of children: 2   Years of education: dental sch   Highest education level: Not on file  Occupational History   Occupation: retired  Tobacco Use   Smoking status: Former    Current packs/day: 0.00    Average packs/day: 2.0 packs/day for 40.0 years (80.0 ttl pk-yrs)    Types: Cigarettes    Start date: 05/29/1959    Quit date: 05/29/1999    Years since quitting: 23.8   Smokeless tobacco: Never  Vaping Use   Vaping status: Never Used  Substance and Sexual Activity   Alcohol use: No   Drug use: No   Sexual activity: Yes  Other Topics Concern   Not on file  Social History Narrative   Not on file   Social Determinants of Health   Financial Resource Strain: Not on file  Food Insecurity: No Food Insecurity (06/22/2022)   Hunger Vital Sign    Worried About Running Out of Food in the Last Year: Never true    Ran Out of Food in the Last  Year: Never true  Transportation Needs: No Transportation Needs (06/22/2022)   PRAPARE - Administrator, Civil Service (Medical): No    Lack of Transportation (Non-Medical): No  Physical Activity: Not on file  Stress: Not on file  Social Connections: Unknown (09/25/2021)   [facility]   Social Network    Social Network: Not on file     Family History:  The patient's family history includes Asthma in her brother; Breast cancer in her maternal aunt; Colon polyps in her brother and daughter; Diabetes in her maternal aunt and paternal aunt; Heart Problems in her father; Hypertension in her brother; Lung cancer in her mother; Skin cancer in her brother.   ROS:   Please see the history of present illness.    ROS All other systems are reviewed and are negative.   PHYSICAL EXAM:   VS:  BP 110/62 (BP Location: Left Arm, Patient Position: Sitting, Cuff Size: Large)   Pulse 60   Ht 5' (1.524 m)   Wt 240 lb (108.9 kg) Comment: Patient weighed at home 04/04/2023 am  SpO2 96%   BMI 46.87 kg/m      General: Alert, oriented x3, no distress, morbid obesity.  Healthy left subclavian pacemaker site.  There are prominent collateral veins overlying the left half of the chest, none on the right. Head: no evidence of trauma, PERRL, EOMI, no exophtalmos or lid lag, no myxedema, no xanthelasma; normal ears, nose and oropharynx Neck: Marked bilateral jugular  venous distention to the angle of the jaw while sitting upright, without ability to generate additional hepatojugular reflux; brisk carotid pulses without delay and no carotid bruits Chest: clear to auscultation, no signs of consolidation by percussion or palpation, normal fremitus, symmetrical and full respiratory excursions Cardiovascular: normal position and quality of the apical impulse, regular rhythm, normal first and second heart sounds, no murmurs, rubs or gallops Abdomen: no tenderness or distention, no  masses by palpation, no abnormal pulsatility or arterial bruits, normal bowel sounds, no hepatosplenomegaly Extremities: Severe bilateral lower extremity edema with weeping wounds Neurological: grossly nonfocal Psych: Normal mood and affect   Wt Readings from Last 3 Encounters:  04/04/23 240 lb (108.9 kg)  04/02/23 239 lb (108.4 kg)  10/01/22 220 lb 6.4 oz (100 kg)      Studies/Labs Reviewed:   TEE 06/13/2021  1. Left ventricular ejection fraction, by estimation, is 60 to 65%. The  left ventricle has normal function. The left ventricle has no regional  wall motion abnormalities. Left ventricular diastolic parameters are  consistent with Grade II diastolic  dysfunction (pseudonormalization). Elevated left atrial pressure.   2. Right ventricular systolic function is mildly reduced. The right  ventricular size is mildly enlarged. There is mildly elevated pulmonary  artery systolic pressure. The estimated right ventricular systolic  pressure is 39.6 mmHg.   3. Left atrial size was mildly dilated. No left atrial/left atrial  appendage thrombus was detected.   4. Right atrial size was mildly dilated.   5. 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 The effective  regurgitant orifice area is 0.2 cm sq, regurgitant volume 45 ml  (regurgitant fraction is inaccurate due  to significant aortic insufficiency. The mitral valve is rheumatic. Mild  to moderate mitral valve regurgitation. No evidence of mitral stenosis.   6. Tricuspid valve regurgitation is moderate.   7. There appear to be two areas of perivalvular leak of the bioprosthetic  AVR. A larger jet is located at 3 o'clock in the vicinity of the left  coronary cusp. A smaller jet is located posteriorly at 12 o'clock towards  the mitral annulus. There is also a   trivial intravalvular jet. The aortic valve has been repaired/replaced.  Aortic  valve regurgitation is moderate. There is a 23 mm Magna valve  present in the aortic position. Procedure Date: 2014 [facility]. Aortic  valve mean gradient measures 13.9 mmHg.  Aortic valve Vmax measures 2.70 m/s. Aortic valve acceleration time  measures 74 msec.   8. S/P ascending aorta graft repair, which is intact. Aortic  root/ascending aorta has been repaired/replaced. There is Moderate (Grade  III) plaque involving the descending aorta.   Cardiac CT angiography 07/06/2021 1.  Status post 23mm Perimount Magna Ease aortic valve replacement   2. There is a paravalular leak adjacent to the left coronary cusp. PVL measures 8mm x 3mm in diameter with perimeter 20mm and area 34mm^2, and involved angle 34 degrees. There also appears to be a  second small PVL in the left coronary cusp close to the commissure between the left and noncoronary cusps that measures 4mm x 2mm in diameter with perimeter 11mm and area 67mm^2, and involved angle 15 degrees.   3. Coronary calcium score 500 (86th percentile)    Right and left heart catheterization 08/07/2021  1.  Patent coronary arteries with mild diffuse nonobstructive irregularity throughout.  There are no high-grade stenoses present. 2.  3+ aortic insufficiency  based on angiographic assessment  3.  Unable to direct a straight tip wire from the paravalvular space into the left ventricle 4.  Normal wedge pressure and LVEDP of 13 mmHg and 16 mmHg, respectively 5.  Moderate pulmonary hypertension with mean PA pressure 34 mmHg, transpulmonary gradient 21 mmHg, PVR 4.2 Wood units   Plan: Continue medical therapy, review case with multidisciplinary heart valve team for consideration of further treatment options. Fick Cardiac Output 4.97 L/min  Fick Cardiac Output Index 2.65 (L/min)/BSA  RA A Wave 13 mmHg  RA V Wave 13 mmHg  RA Mean 8 mmHg  RV Systolic Pressure 46 mmHg  RV Diastolic Pressure 2 mmHg  RV EDP 10 mmHg  PA Systolic Pressure 50 mmHg  PA  Diastolic Pressure 16 mmHg  PA Mean 34 mmHg  PW A Wave 17 mmHg  PW V Wave 14 mmHg  PW Mean 13 mmHg  AO Systolic Pressure 191 mmHg  AO Diastolic Pressure 57 mmHg  AO Mean 111 mmHg  LV Systolic Pressure 206 mmHg  LV Diastolic Pressure 1 mmHg  LV EDP 16 mmHg  AOp Systolic Pressure 193 mmHg  AOp Diastolic Pressure 56 mmHg  AOp Mean Pressure 110 mmHg  LVp Systolic Pressure 203 mmHg  LVp Diastolic Pressure 3 mmHg  LVp EDP Pressure 13 mmHg  QP/QS 1  TPVR Index 12.83 HRUI  TSVR Index 41.87 HRUI  PVR SVR Ratio 0.2  TPVR/TSVR Ratio 0.31      EKG:    EKG Interpretation Date/Time:  Thursday April 04 2023 09:42:35 EST Ventricular Rate:  60 PR Interval:  220 QRS Duration:  162 QT Interval:  468 QTC Calculation: 468 R Axis:   -68  Text Interpretation: AV dual-paced rhythm with prolonged AV conduction When compared with ECG of 22-Jun-2022 10:46, No significant change was found Confirmed by Mikhia Dusek ([id number]) on 04/04/2023 9:53:41 AM        04/01/2023  creatinine 2.15, BUN 38, glucose 57, potassium 5.0, CO2 31, sodium 144, TSH 4.53  ASSESSMENT:    1. Acute on chronic right heart failure (HCC)   2. Atherosclerosis of native coronary artery of native heart without angina pectoris   3. Cor pulmonale, chronic (HCC)   4. Peripheral venous insufficiency   5. S/P aortic valve replacement with bioprosthetic valve   6. Nonrheumatic aortic (valve) insufficiency   7. H/O aortic root repair   8. SSS (sick sinus syndrome) (HCC)   9. Second degree atrioventricular block   10. Pacemaker   11. Essential hypertension   12. Coronary artery disease involving native coronary artery of native heart without angina pectoris   13. Atypical atrial flutter (HCC)   14. Acquired thrombophilia (HCC)   15. Type 2 diabetes mellitus with stage 3b chronic kidney disease, without long-term current use of insulin (HCC)   16. Dyslipidemia   17. Acquired hypothyroidism   18. Anemia, unspecified type    19. Chronic obstructive pulmonary disease, unspecified COPD type (HCC)   20. OSA (obstructive sleep apnea)      PLAN:  In order of problems listed above:  CHF exacerbation: She is clearly hypervolemic today.  She has gained 20 pounds just in the last 30 days.  She is 44 pounds above what we estimated to be her dry weight last November.  She has very little respiratory difficulty if any.  Clearly the dominant problem is right heart failure due to chronic cor pulmonale (PAH, probably due to OSA and obesity hypoventilation syndrome as well as COPD).  Although there may be a current component of decompensated left heart failure, the dominant abnormality is persistent pulmonary hypertension, even when left heart filling pressures are normal.  This is consistent with intrinsic pulmonary arteriolar disease.   On the day of cardiac catheterization her weight was documented as 196 pounds and she is 17 pounds higher today, but it looks like she is gaining some true weight as well.  Continue Farxiga 10 mg daily.   Increase torsemide to 50 mg twice daily until her weight is less than 220 pounds and reassess renal function parameters.   If her weight has not decreased to at least 230 pounds by the end of this month I have asked her daughter to call us so we can further adjust her diuretics.  Options include adding metolazone intermittently or using Furoscix. 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  Uses to have a sleep study.  Even when her pulmonary wedge pressure was in normal range she continues to have edema and signs and symptoms of right heart failure.  Preserved cardiac output.  Venous bicarb 31 suggests compensatory metabolic alkalosis from chronic respiratory acidosis.  At the time of cardiac catheterization ABG showed normal PO2 levels, but I am not sure whether she was wearing oxygen.  PCO2 was elevated at 48, consistent with  some degree of hypoventilation.   Venous stasis ulcer/peripheral venous insufficiency: She has previously had a difficult time healing stasis ulcers and it is very important for [place] to treat this severe edema.  Doppler ultrasound performed 04/21/2021 did not show any evidence of DVT or venous reflux that would be amenable to treatment.  S/p AVR: The murmur is quite distinct, but she has longstanding perivalvular aortic insufficiency, present since the initial surgery.  Results at cardiac catheterization suggest that is not hemodynamically important.  Even if it were significant, attempts to cross the perivalvular defect with a guidewire were unsuccessful, so this will be very difficult to correct with percutaneous means.  She is not a good candidate for repeat open heart surgery. History of aortic root replacement: Intact ascending aortic repair by TEE and CT. SSS: She has sinus bradycardia and mostly atrial paced rhythm with acceptable heart rates considering her sedentary lifestyle. Second-degree AV block: Even though she is not truly pacemaker dependent, the extremely long AV conduction time makes her have virtually 100% ventricular pacing.  Device is subsequently programmed with physiological AV delays.  Pacemaker: Normal device function.  Recent generator change out in February.  Site has healed well.  Continue remote downloads every 3 months. HTN: Well-controlled Coronary atherosclerosis.  Asymptomatic, no angina factors nonobstructive lesions by catheterization 2014 and in 2023, no evidence of disease progression.  However, calcium score on the most recent CT is high at 500 (86 percentile).  The focus is on risk factor modification. Aflutter: This has not been documented since her generator change out in February 2024 had a 4-hour episode of atrial flutter on January 05, 2022 which was rate controlled and asymptomatic, the first event since January, when it also stopped spontaneously.  Successfully  underwent overdrive pacing in April 2021.  The burden of arrhythmia is low and antiarrhythmics are not indicated.  On appropriate Eliquis anticoagulation. CHA2DS2-VASc 7 (age 70, gender, HTN, CHF, DM, CAD). Anticoagulation: The recent falls and or bleeding problems.  On Eliquis.  Still qualifies for the 5 mg twice daily dose until she turns 80. DM: On  Farxiga for heart failure.  She has not had symptomatic hypoglycemia events, note that the glucose was low on her recent labs (57).  She is still also taking glipizide and insulin.   CKD3b: Most recent labs showed worsened kidney function with a creatinine of 2.2 compared to baseline which is around 1.7.  This is probably due to heart failure exacerbation with increased renal venous pressures. HLP: Lipid parameters earlier this year were excellent with LDL 61 and HDL 56 and normal triglycerides. Hypothyroidism: Clinically euthyroid on her current dose of levothyroxine supplement.  TSH in normal range. Anemia: Borderline anemia with hemoglobin 11.6, unchanged over the last 12 months.  Normocytic, normochromic. COPD: Currently not coughing.  Previous PFTs were quite abnormal, but the repeat PFTs showed minimal abnormalities (FEV1 72% of predicted, FVC 95% of predicted, no change with bronchodilators, some air trapping).  Diffusion abnormalities corrected for alveolar volume. OSA/obesity hypoventilation syndrome: she declined the sleep study.    Medication Adjustments/Labs and Tests Ordered: Current medicines are reviewed at length with the patient today.  Concerns regarding medicines are outlined above.  Medication changes, Labs and Tests ordered today are listed in the Patient Instructions below. Patient Instructions  Medication Instructions:  Increase Torsemide to 50 mg twice a day *If you need a refill on your cardiac medications before your next appointment, please call your pharmacy*  Call if weight is still over 230 pounds by December 1st   Lab  Work: BMP, BNP- draw at the facility on Tuesday 04/09/23- fax results to [phone] If you have labs (blood work) drawn today and your tests are completely normal, you will receive your results only by: MyChart Message (if you have MyChart) OR A paper copy in the mail If you have any lab test that is abnormal or we need to change your treatment, we will call you to review the results.  Daily Weights   Low-Sodium Eating Plan Salt (sodium) helps you keep a healthy balance of fluids in your body. Too much sodium can raise your blood pressure. It can also cause fluid and waste to be held in your body. Your health care provider or dietitian may recommend a low-sodium eating plan if you have high blood pressure (hypertension), kidney disease, liver disease, or heart failure. Eating less sodium can help lower your blood pressure and reduce swelling. It can also protect your heart, liver, and kidneys. What are tips for following this plan? Reading food labels  Check food labels for the amount of sodium per serving. If you eat more than one serving, you must multiply the listed amount by the number of servings. Choose foods with less than 140 milligrams (mg) of sodium per serving. Avoid foods with 300 mg of sodium or more per serving. Always check how much sodium is in a product, even if the label says "unsalted" or "no salt added." Shopping  Buy products labeled as "low-sodium" or "no salt added." Buy fresh foods. Avoid canned foods and pre-made or frozen meals. Avoid canned, cured, or processed meats. Buy breads that have less than 80 mg of sodium per slice. Cooking  Eat more home-cooked food. Try to eat less restaurant, buffet, and fast food. Try not to add salt when you cook. Use salt-free seasonings or herbs instead of table salt or sea salt. Check with your provider or pharmacist before using salt substitutes. Cook with plant-based oils, such as canola, sunflower, or olive oil. Meal  planning When eating at a restaurant, ask if your food can  be made with less salt or no salt. Avoid dishes labeled as brined, pickled, cured, or smoked. Avoid dishes made with soy sauce, miso, or teriyaki sauce. Avoid foods that have monosodium glutamate (MSG) in them. MSG may be added to some restaurant food, sauces, soups, bouillon, and canned foods. Make meals that can be grilled, baked, poached, roasted, or steamed. These are often made with less sodium. General information Try to limit your sodium intake to 1,500-2,300 mg each day, or the amount told by your provider. What foods should I eat? Fruits Fresh, frozen, or canned fruit. Fruit juice. Vegetables Fresh or frozen vegetables. "No salt added" canned vegetables. "No salt added" tomato sauce and paste. Low-sodium or reduced-sodium tomato and vegetable juice. Grains Low-sodium cereals, such as oats, puffed wheat and rice, and shredded wheat. Low-sodium crackers. Unsalted rice. Unsalted pasta. Low-sodium bread. Whole grain breads and whole grain pasta. Meats and other proteins Fresh or frozen meat, poultry, seafood, and fish. These should have no added salt. Low-sodium canned tuna and salmon. Unsalted nuts. Dried peas, beans, and lentils without added salt. Unsalted canned beans. Eggs. Unsalted nut butters. Dairy Milk. Soy milk. Cheese that is naturally low in sodium, such as ricotta cheese, fresh mozzarella, or Swiss cheese. Low-sodium or reduced-sodium cheese. Cream cheese. Yogurt. Seasonings and condiments Fresh and dried herbs and spices. Salt-free seasonings. Low-sodium mustard and ketchup. Sodium-free salad dressing. Sodium-free light mayonnaise. Fresh or refrigerated horseradish. Lemon juice. Vinegar. Other foods Homemade, reduced-sodium, or low-sodium soups. Unsalted popcorn and pretzels. Low-salt or salt-free chips. The items listed above may not be all the foods and drinks you can have. Talk to a dietitian to learn more. What  foods should I avoid? Vegetables Sauerkraut, pickled vegetables, and relishes. Olives. [place] fries. Onion rings. Regular canned vegetables, except low-sodium or reduced-sodium items. Regular canned tomato sauce and paste. Regular tomato and vegetable juice. Frozen vegetables in sauces. Grains Instant hot cereals. Bread stuffing, pancake, and biscuit mixes. Croutons. Seasoned rice or pasta mixes. Noodle soup cups. Boxed or frozen macaroni and cheese. Regular salted crackers. Self-rising flour. Meats and other proteins Meat or fish that is salted, canned, smoked, spiced, or pickled. Precooked or cured meat, such as sausages or meat loaves. Tomasa Blase. Ham. Pepperoni. Hot dogs. Corned beef. Chipped beef. Salt pork. Jerky. Pickled herring, anchovies, and sardines. Regular canned tuna. Salted nuts. Dairy Processed cheese and cheese spreads. Hard cheeses. Cheese curds. Blue cheese. Feta cheese. String cheese. Regular cottage cheese. Buttermilk. Canned milk. Fats and oils Salted butter. Regular margarine. Ghee. Bacon fat. Seasonings and condiments Onion salt, garlic salt, seasoned salt, table salt, and sea salt. Canned and packaged gravies. Worcestershire sauce. Tartar sauce. Barbecue sauce. Teriyaki sauce. Soy sauce, including reduced-sodium soy sauce. Steak sauce. Fish sauce. Oyster sauce. Cocktail sauce. Horseradish that you find on the shelf. Regular ketchup and mustard. Meat flavorings and tenderizers. Bouillon cubes. Hot sauce. Pre-made or packaged marinades. Pre-made or packaged taco seasonings. Relishes. Regular salad dressings. Salsa. Other foods Salted popcorn and pretzels. Corn chips and puffs. Potato and tortilla chips. Canned or dried soups. Pizza. Frozen entrees and pot pies. The items listed above may not be all the foods and drinks you should avoid. Talk to a dietitian to learn more. This information is not intended to replace advice given to you by your health care provider. Make sure you  discuss any questions you have with your health care provider. Document Revised: 05/17/2022 Document Reviewed: 05/17/2022 Elsevier Patient Education  2024 Elsevier Inc.   Follow-Up: At  [facility], you and your health needs are our priority.  As part of our continuing mission to provide you with exceptional heart care, we have created designated Provider Care Teams.  These Care Teams include your primary Cardiologist (physician) and Advanced Practice Providers (APPs -  Physician Assistants and Nurse Practitioners) who all work together to provide you with the care you need, when you need it.  We recommend signing up for the patient portal called "MyChart".  Sign up information is provided on this After Visit Summary.  MyChart is used to connect with patients for Virtual Visits (Telemedicine).  Patients are able to view lab/test results, encounter notes, upcoming appointments, etc.  Non-urgent messages can be sent to your provider as well.   To learn more about what you can do with MyChart, go to [email or web address].    Your next appointment:   APP 4 WEEKS  Dr Royann Shivers in 4-5 months     Signed, Thurmon Fair, MD  04/06/2023 5:48 PM    [facility] [address] [place], [place], [place]  [address] Phone: [phone]; Fax: [phone]

## 2023-04-06 ENCOUNTER — Encounter: Payer: Self-pay | Admitting: Cardiovascular Disease

## 2023-04-09 LAB — LAB REPORT - SCANNED: EGFR: 28

## 2023-04-10 ENCOUNTER — Emergency Department (HOSPITAL_COMMUNITY): Payer: 59

## 2023-04-10 ENCOUNTER — Inpatient Hospital Stay (HOSPITAL_COMMUNITY)
Admission: EM | Admit: 2023-04-10 | Discharge: 2023-04-24 | DRG: 286 | Disposition: A | Discharge status: Skilled Nursing Facility | Payer: 59 | Source: Skilled Nursing Facility | Attending: Family Medicine | Admitting: Family Medicine

## 2023-04-10 ENCOUNTER — Telehealth: Payer: Self-pay | Admitting: Emergency Medicine

## 2023-04-10 ENCOUNTER — Encounter (HOSPITAL_COMMUNITY): Payer: Self-pay

## 2023-04-10 ENCOUNTER — Other Ambulatory Visit: Payer: Self-pay

## 2023-04-10 DIAGNOSIS — D62 Acute posthemorrhagic anemia: Secondary | ICD-10-CM | POA: Diagnosis not present

## 2023-04-10 DIAGNOSIS — E039 Hypothyroidism, unspecified: Secondary | ICD-10-CM | POA: Diagnosis present

## 2023-04-10 DIAGNOSIS — F32A Depression, unspecified: Secondary | ICD-10-CM | POA: Diagnosis present

## 2023-04-10 DIAGNOSIS — I429 Cardiomyopathy, unspecified: Secondary | ICD-10-CM | POA: Diagnosis not present

## 2023-04-10 DIAGNOSIS — K219 Gastro-esophageal reflux disease without esophagitis: Secondary | ICD-10-CM | POA: Diagnosis present

## 2023-04-10 DIAGNOSIS — J9621 Acute and chronic respiratory failure with hypoxia: Secondary | ICD-10-CM | POA: Diagnosis present

## 2023-04-10 DIAGNOSIS — J411 Mucopurulent chronic bronchitis: Secondary | ICD-10-CM | POA: Diagnosis not present

## 2023-04-10 DIAGNOSIS — Z9049 Acquired absence of other specified parts of digestive tract: Secondary | ICD-10-CM

## 2023-04-10 DIAGNOSIS — I2781 Cor pulmonale (chronic): Secondary | ICD-10-CM | POA: Diagnosis present

## 2023-04-10 DIAGNOSIS — I5020 Unspecified systolic (congestive) heart failure: Secondary | ICD-10-CM | POA: Diagnosis not present

## 2023-04-10 DIAGNOSIS — K5900 Constipation, unspecified: Secondary | ICD-10-CM | POA: Diagnosis present

## 2023-04-10 DIAGNOSIS — I4892 Unspecified atrial flutter: Secondary | ICD-10-CM | POA: Diagnosis present

## 2023-04-10 DIAGNOSIS — G8929 Other chronic pain: Secondary | ICD-10-CM | POA: Diagnosis present

## 2023-04-10 DIAGNOSIS — Z95810 Presence of automatic (implantable) cardiac defibrillator: Secondary | ICD-10-CM

## 2023-04-10 DIAGNOSIS — J189 Pneumonia, unspecified organism: Secondary | ICD-10-CM | POA: Diagnosis present

## 2023-04-10 DIAGNOSIS — I5021 Acute systolic (congestive) heart failure: Secondary | ICD-10-CM | POA: Diagnosis not present

## 2023-04-10 DIAGNOSIS — Z9981 Dependence on supplemental oxygen: Secondary | ICD-10-CM

## 2023-04-10 DIAGNOSIS — G4733 Obstructive sleep apnea (adult) (pediatric): Secondary | ICD-10-CM | POA: Diagnosis present

## 2023-04-10 DIAGNOSIS — I272 Pulmonary hypertension, unspecified: Secondary | ICD-10-CM | POA: Diagnosis not present

## 2023-04-10 DIAGNOSIS — D631 Anemia in chronic kidney disease: Secondary | ICD-10-CM | POA: Diagnosis present

## 2023-04-10 DIAGNOSIS — N1832 Chronic kidney disease, stage 3b: Secondary | ICD-10-CM

## 2023-04-10 DIAGNOSIS — I442 Atrioventricular block, complete: Secondary | ICD-10-CM | POA: Diagnosis present

## 2023-04-10 DIAGNOSIS — Z8249 Family history of ischemic heart disease and other diseases of the circulatory system: Secondary | ICD-10-CM

## 2023-04-10 DIAGNOSIS — K31811 Angiodysplasia of stomach and duodenum with bleeding: Secondary | ICD-10-CM | POA: Diagnosis not present

## 2023-04-10 DIAGNOSIS — J440 Chronic obstructive pulmonary disease with acute lower respiratory infection: Secondary | ICD-10-CM | POA: Diagnosis present

## 2023-04-10 DIAGNOSIS — Z66 Do not resuscitate: Secondary | ICD-10-CM | POA: Diagnosis present

## 2023-04-10 DIAGNOSIS — Z1152 Encounter for screening for COVID-19: Secondary | ICD-10-CM

## 2023-04-10 DIAGNOSIS — I509 Heart failure, unspecified: Secondary | ICD-10-CM

## 2023-04-10 DIAGNOSIS — N184 Chronic kidney disease, stage 4 (severe): Secondary | ICD-10-CM | POA: Diagnosis present

## 2023-04-10 DIAGNOSIS — K31819 Angiodysplasia of stomach and duodenum without bleeding: Secondary | ICD-10-CM | POA: Diagnosis not present

## 2023-04-10 DIAGNOSIS — Z91198 Patient's noncompliance with other medical treatment and regimen for other reason: Secondary | ICD-10-CM

## 2023-04-10 DIAGNOSIS — I2510 Atherosclerotic heart disease of native coronary artery without angina pectoris: Secondary | ICD-10-CM | POA: Diagnosis present

## 2023-04-10 DIAGNOSIS — R0602 Shortness of breath: Secondary | ICD-10-CM | POA: Diagnosis present

## 2023-04-10 DIAGNOSIS — I2723 Pulmonary hypertension due to lung diseases and hypoxia: Secondary | ICD-10-CM | POA: Diagnosis present

## 2023-04-10 DIAGNOSIS — I50813 Acute on chronic right heart failure: Secondary | ICD-10-CM | POA: Diagnosis not present

## 2023-04-10 DIAGNOSIS — R413 Other amnesia: Secondary | ICD-10-CM | POA: Diagnosis not present

## 2023-04-10 DIAGNOSIS — I513 Intracardiac thrombosis, not elsewhere classified: Secondary | ICD-10-CM | POA: Diagnosis present

## 2023-04-10 DIAGNOSIS — I5023 Acute on chronic systolic (congestive) heart failure: Secondary | ICD-10-CM

## 2023-04-10 DIAGNOSIS — E1142 Type 2 diabetes mellitus with diabetic polyneuropathy: Secondary | ICD-10-CM | POA: Diagnosis present

## 2023-04-10 DIAGNOSIS — I495 Sick sinus syndrome: Secondary | ICD-10-CM | POA: Diagnosis present

## 2023-04-10 DIAGNOSIS — Z794 Long term (current) use of insulin: Secondary | ICD-10-CM | POA: Diagnosis not present

## 2023-04-10 DIAGNOSIS — Z22322 Carrier or suspected carrier of Methicillin resistant Staphylococcus aureus: Secondary | ICD-10-CM

## 2023-04-10 DIAGNOSIS — I5022 Chronic systolic (congestive) heart failure: Secondary | ICD-10-CM | POA: Diagnosis not present

## 2023-04-10 DIAGNOSIS — K449 Diaphragmatic hernia without obstruction or gangrene: Secondary | ICD-10-CM | POA: Diagnosis not present

## 2023-04-10 DIAGNOSIS — M810 Age-related osteoporosis without current pathological fracture: Secondary | ICD-10-CM | POA: Diagnosis present

## 2023-04-10 DIAGNOSIS — J449 Chronic obstructive pulmonary disease, unspecified: Secondary | ICD-10-CM | POA: Insufficient documentation

## 2023-04-10 DIAGNOSIS — I5043 Acute on chronic combined systolic (congestive) and diastolic (congestive) heart failure: Secondary | ICD-10-CM | POA: Diagnosis not present

## 2023-04-10 DIAGNOSIS — E876 Hypokalemia: Secondary | ICD-10-CM | POA: Diagnosis present

## 2023-04-10 DIAGNOSIS — N179 Acute kidney failure, unspecified: Secondary | ICD-10-CM | POA: Diagnosis present

## 2023-04-10 DIAGNOSIS — K921 Melena: Secondary | ICD-10-CM | POA: Diagnosis not present

## 2023-04-10 DIAGNOSIS — Z9884 Bariatric surgery status: Secondary | ICD-10-CM

## 2023-04-10 DIAGNOSIS — E119 Type 2 diabetes mellitus without complications: Secondary | ICD-10-CM

## 2023-04-10 DIAGNOSIS — I480 Paroxysmal atrial fibrillation: Secondary | ICD-10-CM | POA: Diagnosis present

## 2023-04-10 DIAGNOSIS — I872 Venous insufficiency (chronic) (peripheral): Secondary | ICD-10-CM | POA: Diagnosis present

## 2023-04-10 DIAGNOSIS — Z79890 Hormone replacement therapy (postmenopausal): Secondary | ICD-10-CM

## 2023-04-10 DIAGNOSIS — D649 Anemia, unspecified: Secondary | ICD-10-CM | POA: Diagnosis not present

## 2023-04-10 DIAGNOSIS — E1122 Type 2 diabetes mellitus with diabetic chronic kidney disease: Secondary | ICD-10-CM | POA: Diagnosis present

## 2023-04-10 DIAGNOSIS — J9622 Acute and chronic respiratory failure with hypercapnia: Secondary | ICD-10-CM | POA: Diagnosis present

## 2023-04-10 DIAGNOSIS — Z953 Presence of xenogenic heart valve: Secondary | ICD-10-CM

## 2023-04-10 DIAGNOSIS — Z952 Presence of prosthetic heart valve: Secondary | ICD-10-CM | POA: Diagnosis not present

## 2023-04-10 DIAGNOSIS — Z961 Presence of intraocular lens: Secondary | ICD-10-CM | POA: Diagnosis present

## 2023-04-10 DIAGNOSIS — Z7984 Long term (current) use of oral hypoglycemic drugs: Secondary | ICD-10-CM | POA: Diagnosis not present

## 2023-04-10 DIAGNOSIS — Z87891 Personal history of nicotine dependence: Secondary | ICD-10-CM

## 2023-04-10 DIAGNOSIS — Z6841 Body Mass Index (BMI) 40.0 and over, adult: Secondary | ICD-10-CM | POA: Diagnosis not present

## 2023-04-10 DIAGNOSIS — R195 Other fecal abnormalities: Secondary | ICD-10-CM | POA: Diagnosis not present

## 2023-04-10 DIAGNOSIS — I350 Nonrheumatic aortic (valve) stenosis: Secondary | ICD-10-CM | POA: Diagnosis present

## 2023-04-10 DIAGNOSIS — E7800 Pure hypercholesterolemia, unspecified: Secondary | ICD-10-CM | POA: Diagnosis present

## 2023-04-10 DIAGNOSIS — Z751 Person awaiting admission to adequate facility elsewhere: Secondary | ICD-10-CM

## 2023-04-10 DIAGNOSIS — Z833 Family history of diabetes mellitus: Secondary | ICD-10-CM

## 2023-04-10 DIAGNOSIS — Z8673 Personal history of transient ischemic attack (TIA), and cerebral infarction without residual deficits: Secondary | ICD-10-CM

## 2023-04-10 DIAGNOSIS — M19042 Primary osteoarthritis, left hand: Secondary | ICD-10-CM | POA: Diagnosis present

## 2023-04-10 DIAGNOSIS — D509 Iron deficiency anemia, unspecified: Secondary | ICD-10-CM | POA: Diagnosis present

## 2023-04-10 DIAGNOSIS — Z7983 Long term (current) use of bisphosphonates: Secondary | ICD-10-CM

## 2023-04-10 DIAGNOSIS — Z950 Presence of cardiac pacemaker: Secondary | ICD-10-CM | POA: Diagnosis not present

## 2023-04-10 DIAGNOSIS — I130 Hypertensive heart and chronic kidney disease with heart failure and stage 1 through stage 4 chronic kidney disease, or unspecified chronic kidney disease: Secondary | ICD-10-CM | POA: Diagnosis present

## 2023-04-10 DIAGNOSIS — I959 Hypotension, unspecified: Secondary | ICD-10-CM | POA: Diagnosis present

## 2023-04-10 DIAGNOSIS — M19041 Primary osteoarthritis, right hand: Secondary | ICD-10-CM | POA: Diagnosis present

## 2023-04-10 DIAGNOSIS — Z79899 Other long term (current) drug therapy: Secondary | ICD-10-CM

## 2023-04-10 DIAGNOSIS — J441 Chronic obstructive pulmonary disease with (acute) exacerbation: Secondary | ICD-10-CM | POA: Insufficient documentation

## 2023-04-10 LAB — CBC WITH DIFFERENTIAL/PLATELET
Abs Immature Granulocytes: 0.02 10*3/uL (ref 0.00–0.07)
Basophils Absolute: 0 10*3/uL (ref 0.0–0.1)
Basophils Relative: 0 %
Eosinophils Absolute: 0.5 10*3/uL (ref 0.0–0.5)
Eosinophils Relative: 5 %
HCT: 26.7 % — ABNORMAL LOW (ref 36.0–46.0)
Hemoglobin: 7.9 g/dL — ABNORMAL LOW (ref 12.0–15.0)
Immature Granulocytes: 0 %
Lymphocytes Relative: 21 %
Lymphs Abs: 2 10*3/uL (ref 0.7–4.0)
MCH: 29 pg (ref 26.0–34.0)
MCHC: 29.6 g/dL — ABNORMAL LOW (ref 30.0–36.0)
MCV: 98.2 fL (ref 80.0–100.0)
Monocytes Absolute: 0.6 10*3/uL (ref 0.1–1.0)
Monocytes Relative: 7 %
Neutro Abs: 6.1 10*3/uL (ref 1.7–7.7)
Neutrophils Relative %: 67 %
Platelets: 161 10*3/uL (ref 150–400)
RBC: 2.72 MIL/uL — ABNORMAL LOW (ref 3.87–5.11)
RDW: 14.7 % (ref 11.5–15.5)
WBC: 9.2 10*3/uL (ref 4.0–10.5)
nRBC: 0 % (ref 0.0–0.2)

## 2023-04-10 LAB — RESP PANEL BY RT-PCR (RSV, FLU A&B, COVID)  RVPGX2
Influenza A by PCR: NEGATIVE
Influenza B by PCR: NEGATIVE
Resp Syncytial Virus by PCR: NEGATIVE
SARS Coronavirus 2 by RT PCR: NEGATIVE

## 2023-04-10 LAB — BASIC METABOLIC PANEL
Anion gap: 8 (ref 5–15)
BUN: 33 mg/dL — ABNORMAL HIGH (ref 8–23)
CO2: 31 mmol/L (ref 22–32)
Calcium: 8.7 mg/dL — ABNORMAL LOW (ref 8.9–10.3)
Chloride: 101 mmol/L (ref 98–111)
Creatinine, Ser: 2.43 mg/dL — ABNORMAL HIGH (ref 0.44–1.00)
GFR, Estimated: 20 mL/min — ABNORMAL LOW (ref >60)
Glucose, Bld: 115 mg/dL — ABNORMAL HIGH (ref 70–99)
Potassium: 3.7 mmol/L (ref 3.5–5.1)
Sodium: 140 mmol/L (ref 135–145)

## 2023-04-10 LAB — BRAIN NATRIURETIC PEPTIDE: B Natriuretic Peptide: 591.4 pg/mL — ABNORMAL HIGH (ref 0.0–100.0)

## 2023-04-10 LAB — GLUCOSE, CAPILLARY: Glucose-Capillary: 148 mg/dL — ABNORMAL HIGH (ref 70–99)

## 2023-04-10 MED ORDER — FERROUS SULFATE 325 (65 FE) MG PO TABS
325.0000 mg | ORAL_TABLET | Freq: Every day | ORAL | Status: DC
  Administered 2023-04-11 – 2023-04-18 (×8): 325 mg via ORAL
  Filled 2023-04-10 (×8): qty 1

## 2023-04-10 MED ORDER — INSULIN ASPART 100 UNIT/ML IJ SOLN
0.0000 [IU] | Freq: Three times a day (TID) | INTRAMUSCULAR | Status: DC
  Administered 2023-04-11 – 2023-04-12 (×2): 1 [IU] via SUBCUTANEOUS
  Administered 2023-04-12 (×2): 2 [IU] via SUBCUTANEOUS
  Administered 2023-04-13: 1 [IU] via SUBCUTANEOUS
  Administered 2023-04-13 (×2): 2 [IU] via SUBCUTANEOUS
  Administered 2023-04-14: 3 [IU] via SUBCUTANEOUS
  Administered 2023-04-14 – 2023-04-16 (×6): 2 [IU] via SUBCUTANEOUS
  Administered 2023-04-16: 3 [IU] via SUBCUTANEOUS
  Administered 2023-04-16 – 2023-04-18 (×6): 2 [IU] via SUBCUTANEOUS
  Administered 2023-04-18 – 2023-04-19 (×2): 3 [IU] via SUBCUTANEOUS
  Administered 2023-04-19 – 2023-04-20 (×4): 2 [IU] via SUBCUTANEOUS
  Administered 2023-04-20 – 2023-04-21 (×3): 3 [IU] via SUBCUTANEOUS
  Administered 2023-04-21 – 2023-04-24 (×9): 2 [IU] via SUBCUTANEOUS

## 2023-04-10 MED ORDER — ATORVASTATIN CALCIUM 40 MG PO TABS
40.0000 mg | ORAL_TABLET | Freq: Every day | ORAL | Status: DC
  Administered 2023-04-11 – 2023-04-24 (×14): 40 mg via ORAL
  Filled 2023-04-10 (×14): qty 1

## 2023-04-10 MED ORDER — ADULT MULTIVITAMIN W/MINERALS CH
1.0000 | ORAL_TABLET | Freq: Every day | ORAL | Status: DC
  Administered 2023-04-11 – 2023-04-24 (×13): 1 via ORAL
  Filled 2023-04-10 (×14): qty 1

## 2023-04-10 MED ORDER — IPRATROPIUM-ALBUTEROL 0.5-2.5 (3) MG/3ML IN SOLN
3.0000 mL | RESPIRATORY_TRACT | Status: DC | PRN
  Administered 2023-04-16: 3 mL via RESPIRATORY_TRACT
  Filled 2023-04-10: qty 3

## 2023-04-10 MED ORDER — ACETAMINOPHEN 325 MG PO TABS
650.0000 mg | ORAL_TABLET | Freq: Four times a day (QID) | ORAL | Status: DC | PRN
  Administered 2023-04-14 – 2023-04-16 (×2): 650 mg via ORAL
  Filled 2023-04-10 (×3): qty 2

## 2023-04-10 MED ORDER — SODIUM CHLORIDE 0.9 % IV SOLN: 250.0000 mL | INTRAVENOUS | Status: AC | PRN

## 2023-04-10 MED ORDER — MAGNESIUM OXIDE -MG SUPPLEMENT 400 (240 MG) MG PO TABS: 400.0000 mg | ORAL_TABLET | Freq: Every day | ORAL | Status: DC

## 2023-04-10 MED ORDER — GABAPENTIN 300 MG PO CAPS
600.0000 mg | ORAL_CAPSULE | Freq: Every day | ORAL | Status: DC
  Administered 2023-04-10 – 2023-04-23 (×14): 600 mg via ORAL
  Filled 2023-04-10 (×14): qty 2

## 2023-04-10 MED ORDER — CLONIDINE HCL 0.2 MG PO TABS: 0.2000 mg | ORAL_TABLET | Freq: Every day | ORAL | Status: DC

## 2023-04-10 MED ORDER — SODIUM CHLORIDE 0.9% FLUSH
3.0000 mL | Freq: Two times a day (BID) | INTRAVENOUS | Status: DC
  Administered 2023-04-10 – 2023-04-24 (×25): 3 mL via INTRAVENOUS

## 2023-04-10 MED ORDER — TRAZODONE HCL 50 MG PO TABS
50.0000 mg | ORAL_TABLET | Freq: Every day | ORAL | Status: DC
  Administered 2023-04-10 – 2023-04-13 (×4): 50 mg via ORAL
  Filled 2023-04-10 (×4): qty 1

## 2023-04-10 MED ORDER — FUROSEMIDE 10 MG/ML IJ SOLN
80.0000 mg | Freq: Once | INTRAMUSCULAR | Status: AC
  Administered 2023-04-10: 80 mg via INTRAVENOUS
  Filled 2023-04-10: qty 8

## 2023-04-10 MED ORDER — LEVOTHYROXINE SODIUM 75 MCG PO TABS
75.0000 ug | ORAL_TABLET | Freq: Every day | ORAL | Status: DC
  Administered 2023-04-11 – 2023-04-24 (×14): 75 ug via ORAL
  Filled 2023-04-10 (×14): qty 1

## 2023-04-10 MED ORDER — SUCRALFATE 1 G PO TABS
1.0000 g | ORAL_TABLET | Freq: Three times a day (TID) | ORAL | Status: DC
  Administered 2023-04-10 – 2023-04-24 (×50): 1 g via ORAL
  Filled 2023-04-10 (×53): qty 1

## 2023-04-10 MED ORDER — GUAIFENESIN ER 600 MG PO TB12
600.0000 mg | ORAL_TABLET | Freq: Two times a day (BID) | ORAL | Status: DC
  Administered 2023-04-10 – 2023-04-14 (×8): 600 mg via ORAL
  Filled 2023-04-10 (×8): qty 1

## 2023-04-10 MED ORDER — ACETAMINOPHEN 650 MG RE SUPP: 650.0000 mg | Freq: Four times a day (QID) | RECTAL | Status: DC | PRN

## 2023-04-10 MED ORDER — POTASSIUM CHLORIDE 10 MEQ/100ML IV SOLN
10.0000 meq | INTRAVENOUS | Status: AC
  Administered 2023-04-10 – 2023-04-11 (×4): 10 meq via INTRAVENOUS
  Filled 2023-04-10 (×4): qty 100

## 2023-04-10 MED ORDER — SODIUM CHLORIDE 0.9% FLUSH: 3.0000 mL | INTRAVENOUS | Status: DC | PRN

## 2023-04-10 MED ORDER — MOMETASONE FURO-FORMOTEROL FUM 200-5 MCG/ACT IN AERO
2.0000 | INHALATION_SPRAY | Freq: Two times a day (BID) | RESPIRATORY_TRACT | Status: DC
  Administered 2023-04-11 – 2023-04-24 (×26): 2 via RESPIRATORY_TRACT
  Filled 2023-04-10 (×2): qty 8.8

## 2023-04-10 MED ORDER — CARVEDILOL 3.125 MG PO TABS: 3.1250 mg | ORAL_TABLET | Freq: Two times a day (BID) | ORAL | Status: DC

## 2023-04-10 MED ORDER — PANTOPRAZOLE SODIUM 40 MG PO TBEC
40.0000 mg | DELAYED_RELEASE_TABLET | Freq: Two times a day (BID) | ORAL | Status: DC
  Administered 2023-04-10 – 2023-04-17 (×14): 40 mg via ORAL
  Filled 2023-04-10 (×14): qty 1

## 2023-04-10 MED ORDER — TORSEMIDE 40 MG PO TABS: 40.0000 mg | ORAL_TABLET | Freq: Two times a day (BID) | ORAL | 3 refills | Status: DC

## 2023-04-10 MED ORDER — FLUOXETINE HCL 20 MG PO CAPS
40.0000 mg | ORAL_CAPSULE | Freq: Every day | ORAL | Status: DC
  Administered 2023-04-11 – 2023-04-24 (×14): 40 mg via ORAL
  Filled 2023-04-10 (×14): qty 2

## 2023-04-10 MED ORDER — IRBESARTAN 300 MG PO TABS
150.0000 mg | ORAL_TABLET | Freq: Every day | ORAL | Status: DC
  Administered 2023-04-11: 150 mg via ORAL
  Filled 2023-04-10: qty 1

## 2023-04-10 NOTE — Addendum Note (Signed)
Addended by: Scheryl Marten on: 04/10/2023 04:49 PM   Modules accepted: Orders

## 2023-04-10 NOTE — ED Notes (Signed)
Large formed bowel movement. Full bed change and placed in gown.

## 2023-04-10 NOTE — ED Provider Notes (Signed)
[facility]ERGENC[facility]PARTMENT AT [facility] Provider Note   CSN: [id number] Arrival date & time: 04/10/23  1748     History Chief Complaint  Patient presents with   Abnormal Lab    Pt coming by EMS from SNF for low hgb. Pts Hgb 8.1 per ems and advised to come to ed for eval. Pt asymptomatic. Wears 2L [place] at baseline. 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 Recent back sx.     HPI Tonya Glass is a 77 y.o. female presenting for multiple complaints.  Primarily, patient is coming in with shortness of breath progressive for the past 72 hours.  Family is at bedside provides much of the collateral history.  Recently diagnosed with pneumonia at her facility but more importantly family identified the patient has been gaining weight at home.  Has gone from her target weight of 230 up to 237 over the past few days.  Last response to Lasix.  Outpatient labs had hemodilution and worsening AKI.  Cardiology messages concern for volume overload recommended admission and brought into the hospital for management. Concerning the pneumonia diagnosis is nonspecific, appears to just be based on x-ray performed at the facility. No fevers at the facility..   Patient's recorded medical, surgical, social, medication list and allergies were reviewed in the Snapshot window as part of the initial history.   Review of Systems   Review of Systems  Constitutional:  Negative for chills and fever.  HENT:  Negative for ear pain and sore throat.   Eyes:  Negative for pain and visual disturbance.  Respiratory:  Positive for cough and shortness of breath.   Cardiovascular:  Negative for chest pain and palpitations.  Gastrointestinal:  Negative for abdominal pain and vomiting.  Genitourinary:  Negative for dysuria and hematuria.  Musculoskeletal:  Negative for arthralgias and back pain.  Skin:  Negative for color change and rash.  Neurological:  Negative for seizures and syncope.  All other systems reviewed and  are negative.   Physical Exam Updated Vital Signs BP (!) 119/27   Pulse 67   Temp 98.8 F (37.1 C) (Oral)   Resp 16   Ht 5' (1.524 m)   Wt 108.4 kg   SpO2 100%   BMI 46.68 kg/m  Physical Exam Vitals and nursing note reviewed.  Constitutional:      General: She is not in acute distress.    Appearance: She is well-developed.  HENT:     Head: Normocephalic and atraumatic.  Eyes:     Conjunctiva/sclera: Conjunctivae normal.  Cardiovascular:     Rate and Rhythm: Normal rate and regular rhythm.     Heart sounds: No murmur heard. Pulmonary:     Effort: Respiratory distress present.     Breath sounds: Normal breath sounds.  Abdominal:     Palpations: Abdomen is soft.     Tenderness: There is no abdominal tenderness.  Musculoskeletal:        General: No swelling.     Cervical back: Neck supple.     Right lower leg: Edema present.     Left lower leg: Edema present.  Skin:    General: Skin is warm and dry.     Capillary Refill: Capillary refill takes less than 2 seconds.  Neurological:     Mental Status: She is alert.  Psychiatric:        Mood and Affect: Mood normal.      ED Course/ Medical Decision Making/ A&P    Procedures Procedures  Medications Ordered in ED Medications  furosemide (LASIX) injection 80 mg (80 mg Intravenous Given 04/10/23 2025)    Medical Decision Making:   77 year old female with a pretty profound medical history presenting in acute respiratory distress.  Bedside nursing called me to bedside immediately on patient's arrival to the emergency room for evaluation. She was rapidly escalating and an oxygen requirement went from 2 to 6 L over 30 minutes. I called for BiPAP to immediately stabilize the patient. Her history of present illness and physical exam findings are most consistent with likely developing heart failure exacerbation based on her gaining weight, profound edema, acute respiratory distress.  Considered starting a nitroglycerin  drip to offload her afterload.  However the patient has baseline borderline hypotension I do not believe would tolerate a nitroglycerin drip at this time. Started diuresis while patient stabilized on BiPAP.  Reassessed frequently at bedside.  Reassessment: After 4 hours in the emergency room, she has stabilized well on BiPAP.  I consulted family medicine for admission.  They evaluated at bedside and agree with plan.  Patient arranged for admission to the progressive care unit.  I favor pneumonia much less likely given lack of fevers and findings on x-ray being bilateral.  White count not elevated at this time while BNP is.  Will treat as heart failure exacerbation and draw blood cultures pending changes in patient's status.  Disposition:   Based on the above findings, I believe this patient is stable for admission.    Patient/family educated about specific findings on our evaluation and explained exact reasons for admission.  Patient/family educated about clinical situation and time was allowed to answer questions.   Admission team communicated with and agreed with need for admission. Patient admitted. Patient ready to move at this time.     Emergency Department Medication Summary:   Medications  furosemide (LASIX) injection 80 mg (80 mg Intravenous Given 04/10/23 2025)         Clinical Impression:  1. SOB (shortness of breath)      Admit   Final Clinical Impression(s) / ED Diagnoses Final diagnoses:  SOB (shortness of breath)    Rx / DC Orders ED Discharge Orders     None         Glyn Ade, MD 04/10/23 2152

## 2023-04-10 NOTE — Telephone Encounter (Signed)
Pt's daughter returned call and states that her mother's weight this morning was 237.2 pounds.  Informed her that I would give Dr C this information and if any changes are needed then I would let her know. She verbalized understanding.

## 2023-04-10 NOTE — Telephone Encounter (Signed)
That sounds wrong. Can we call her daughter?

## 2023-04-10 NOTE — Telephone Encounter (Signed)
Faxed orders: Discontinue Eliquis, Decrease Torsemide to 40 mg twice a day, Recheck H&H and BMET on 04/15/23- fax results to [phone]  Also, that the provider recommends patient be hospitalized due to Hg 8 and current pneumonia.

## 2023-04-10 NOTE — Progress Notes (Signed)
   04/10/23 2159  BiPAP/CPAP/SIPAP  BiPAP/CPAP/SIPAP Pt Type Adult  BiPAP/CPAP/SIPAP V60  Mask Type Full face mask  Mask Size Small  Set Rate 12 breaths/min  Respiratory Rate 20 breaths/min  IPAP 12 cmH20  EPAP 6 cmH2O  FiO2 (%) 50 %  Minute Ventilation 6.9  Leak 10  Peak Inspiratory Pressure (PIP) 13  Tidal Volume (Vt) 345  Patient Home Equipment No  Auto Titrate No   Pt transferred to 3E-15 with no complications.  Resting comfortably on bipap, RT will continue to monitor

## 2023-04-10 NOTE — Progress Notes (Signed)
RT called to bedside due to pt being in respiratory distress. Pt sats were 90% on 10L HFNC. RT placed pt on bipap per MD and pt is currently tolerating it well at this time, sats 98% on 60%. RT will monitor as needed.

## 2023-04-10 NOTE — Telephone Encounter (Signed)
Hemoglobin is low again. Dropped from 11 to 8. This may explain why the CHF is worse again. Please stop eliquis. Decrease the torsemide to 40 mg  twice daily. Recheck hemoglobin and BMET after the holiday weekend. Is the weeping from the legs any better? If she has dyspnea  or overt bleeding, please take to hospital     Spoke with [facility], gave the information above. She reports that the weeping is about the same and that her mother has Pneumonia in her right lung. (Gave this information to Dr Royann Shivers and he said that the patient should be hospitalized due to the drop in Hemoglobin and Pneumonia) Gave this information to [place]- she gave me the number to the facility. I will call the facility and speak with the staff to get fax number to send orders and also tell them that Dr C recommends the patient be hospitalized. Beth verbalized understanding.

## 2023-04-10 NOTE — Telephone Encounter (Signed)
Spoke with a member of Education administrator at [employer]. Was given fax number to send orders= [phone]. Also, informed nursing staff that Dr Royann Shivers recommends that this patient be hospitalized due to her decreased Hemoglobin= 8 and current Pneumonia. She verbalized understanding and is aware of orders being faxed to facility.

## 2023-04-10 NOTE — Telephone Encounter (Addendum)
Croitoru, Mihai, MD  Scheryl Marten, RN Labs show that the BNP is lower (less evidence of excess fluid) and the creatinine is higher (worse kidney function). Is the weight lower than 230 lb yet (we are tentatively shooting for less than 220 lb eventually).  Gave the information above to the patient. I asked her how much she weighs and she said that she is pretty sure it was 239 last night. I told her that I would give this information to Dr Royann Shivers.

## 2023-04-10 NOTE — Telephone Encounter (Signed)
Spoke with the patient's daughter. She will call the facility now and ask what her weight is and call me back.

## 2023-04-10 NOTE — H&P (Incomplete)
Hospital Admission History and Physical Service Pager: [phone]  Patient name: Tonya Glass Medical record number: [id number] Date of Birth: Oct 20, 1945 Age: 77 y.o. Gender: female  Primary Care Provider: No primary care provider on file. Consultants: None Code Status: DNR/DNI, which was confirmed with patient at bedside Preferred Emergency Contact: Contact Information     Name Relation Home Work Mobile   [place] Daughter   [phone]      Other Contacts   None on File    Chief Complaint: Dyspnea  Assessment and Plan: Tonya Glass is a 77 y.o. female presenting with dyspnea and weight gain and admitted for acute hypoxic respiratory failure secondary to acute exacerbation of CHF.  Additionally, considered COPD exacerbation, pneumonia, symptomatic anemia secondary to GI bleed, and symptomatic aortic stenosis/bioprosthetic valve dysfunction on differential.  Given patient's significant volume overload on exam, vascular congestion on CXR, and elevated weight from baseline, leading etiology on differential is decompensated heart failure.  COPD is likely contributing to patient's limited respiratory reserve, but patient's presentation is not consistent with acute exacerbation.  While pneumonia was suspected at patient's facility, CXR findings on admission are more consistent with pulmonary edema and vascular congestion rather than focal consolidation and patient remains afebrile without leukocytosis, making PNA less likely.  Of note, patient is anemic compared to last hemoglobin in February, but it is suspected that this is due to hemodilution, though symptomatic anemia would further exacerbate patient's dyspnea/CHF.  If the patient is acutely anemic due to blood loss, hemoglobin will not improve with diuresis and GI will need to be consulted.  Given patient's history of aortic stenosis and known replacement valve, valvular dysfunction remains on differential but is less likely  and patient follows closely with cardiology outpatient. Assessment & Plan Acute exacerbation of CHF (congestive heart failure) (HCC) Patient significantly volume overloaded on exam.  Weight was under 215 lbs on 8/12 per cardiology note, but over 245 lbs in the ED today.  Last EF 60-65% January 2023.  Status post 80 mg Lasix IV in ED, will follow UOP. -Admit to inpatient, progressive care, attending Dr. Jennette Kettle -PT OT to evaluate and treat -Daily weights, strict I&Os -Redose Lasix in AM -Hold carvedilol given decompensated heart failure, restart as able -Follow-up TTE -Consider cardiology consult for CHF in AM -TSH pending Anemia Hgb has decreased from 11.6 and February 2024 to 7.9 this hospitalization.  Possible hemodilution in setting of poor diuresis and CHF exacerbation with significant volume retention, though would not likely cause such a significant drop.  Also considering GI bleed given mucosal pallor, though lower suspicion at this time.  If anemia is hemodilutional, expect to improve with diuresis.  If not improving, consider consulting GI.  No evidence of acute bleed at this time. -Continue home iron 325 mg daily, adding iron panel to am labs  -Hold home Eliquis 5 mg for now, restart if hemoglobin stable -Occult stool blood card pending -APTT and PT/INR -AM CBC, trend H&H COPD (chronic obstructive pulmonary disease) (HCC) Suspect CHF is driving patient's current dyspnea, though underlying COPD likely contributing to limited respiratory reserve.  Lower suspicion of acute COPD exacerbation.  RPP negative. See no role for steroids/antibiotics at this moment.  -Continue BiPAP until stable, wean O2 as able with goal O2 88-92%, home baseline 4 L -DuoNebs every 4 hours PRN -Continue Dulera 2 puffs BID (on Symbicort formulary equivalent) -Continue guaifenesin 600 mg BID -Follow-up blood cultures DM2 (diabetes mellitus, type 2) (HCC) Last A1c 6.16 Sep 2022.  Chart review shows home meds are  12 units Lantus nightly with sliding scale Humalog BID, glipizide 10 mg BID, and Jardiance 10 mg daily. -CBGs Q4h on BiPAP, then transition to ACHS and AM -Sensitive SSI -Dose long-acting insulin after trending CBGs -Hold home Jardiance 10 mg in setting of AKI -Hold home glipizide 10 mg BID while with poor PO intake to avoid hypoglycemia -Follow-up A1c Acute kidney injury superimposed on stage 3b chronic kidney disease (HCC) Creatinine elevated to 2.43, baseline ~1.5-1.7.  Suspect cardiorenal syndrome given profound volume overload with poor output, expect improvement with diuresis. -Hold Jardiance -Diuresis as above GERD (gastroesophageal reflux disease) Could not find mention of gastritis or GI follow-up in chart, though it is notable that patient is on Carafate.  Lower suspicion for GI bleed, but will manage anemia as per above and consult GI if necessary. -Continue home Carafate 1 g TID -Continue pantoprazole 40 mg PO BID Paroxysmal atrial flutter [facility]) Cardiologist Dr. Royann Shivers recommended holding Eliquis this morning outpatient given patient's hemoglobin drop.  Will evaluate prosthetic valve function on echo. -Hold Eliquis 5 mg BID for now given concern for acute drop in hemoglobin, restart if hemoglobin stable -AM BMP; goal K>4, mag>2  Chronic and stable: Hypothyroidism: Continue levothyroxine 75 mcg daily, check TSH Depression: Continue fluoxetine 40 mg daily, trazodone 50 mg nightly Peripheral neuropathy: Continue gabapentin 600 mg nightly Osteoporosis: On alendronate 70 mg every Sunday HLD: Continue home atorvastatin 40 mg daily HTN: Continue clonidine 0.2 mg nightly, irbesartan 150 mg daily (olmesartan 20 mg formulary equivalent), hold carvedilol 3.125 mg BID  FEN/GI: NPO while on BiPAP then heart healthy and carb modified diet VTE Prophylaxis: SCDs, holding home Eliquis in the setting of hemoglobin drop, will trend H&H  Disposition: Progressive  History of Present  Illness: Tonya Glass is a 77 y.o. female with a pertinent PMH of HFpEF, HTN, sick sinus syndrome status post ICD, aortic stenosis status post valve replacement, CKD 3b, hypothyroidism, COPD, and T2DM presenting with dyspnea and increased oxygen requirement.  Patient has been on torsemide 100 mg BID outpatient, but weight has not been decreasing per family and was not diuresing well outpatient.  Seen by cardiology (Dr. Royann Shivers) recently and noted Hgb decrease from 11 to 8, so stopped Eliquis this AM and decreased torsemide to 40 mg BID.  Cardiology RN called patient's daughter, learned that patient was diagnosed with pneumonia at her facility, and Dr. Royann Shivers recommended hospitalization in setting of worsening anemia and PNA.  Patient is coming from [place] facility.  Reports increased cough on Saturday productive of yellow sputum along with dyspnea, but no fevers.  On 4L of oxygen at home.  CXR showing mild infiltrate at R lung base on 11/25.  Started on amoxicillin after that.  Sleeps sitting up, legs have been weeping for some time.  Stool was darkened a few weeks ago, referred to GI, but patient is unsure of what the outcome was, states stool is normal now.  In the ED, patient severely dyspneic and heavily volume overloaded on exam with anasarca.  Oxygen requirement rapidly escalated from 2L to 10L and required BiPAP, on which oxygen saturation finally increased.  Received 80 mg Lasix IV.  Creatinine elevated to 2.43 and BUN to 33.  BNP elevated to 591.  Blood cultures drawn but no antibiotics started given suspicion for CHF exacerbation over PNA.  RPP negative.  Review Of Systems: Per HPI.  Pertinent Past Medical History: -HFpEF -HLD -Aortic stenosis s/p valve replacement -Sick sinus syndrome -  Hypothyroidism -Peripheral neuropathy -HTN -CKD 3b -COPD -T2DM  Remainder reviewed in history tab.   Pertinent Past Surgical History: -Coronary catheterization 2023 -TEE 2023 -Cardiac  aortic valve replacement, bioprosthetic 2014 -Laparoscopic gastric sleeve resection 2009 -Pacemaker placement 2014  Remainder reviewed in history tab.  Pertinent Social History: Tobacco use: 40 years x 2.5 PPD, quit ~4 years ago Alcohol use: None Other Substance use: None Lives with roommate at [facility] facility  Pertinent Family History: -Lung cancer in mother -Skin cancer in brother -Father with cardiac issues  Remainder reviewed in history tab.   Important Outpatient Medications: -Reviewed in medication history.   Objective: BP (!) 113/46   Pulse 77   Temp 99.4 F (37.4 C) (Oral)   Resp 16   Ht 5' (1.524 m)   Wt 108.4 kg   SpO2 100%   BMI 46.68 kg/m   Physical Exam: General: Elderly female under multiple layers of covers, resting in bed.  NAD.  Alert and at baseline. Eyes: Conjunctival pallor. ENTM: MMM. Neck: JVD present. Cardiovascular: Regular rate and rhythm.  Normal S1/2.  Possible slight flow murmur, no rubs or gallops. Respiratory: Normal work of breathing on BiPAP.  Diminished breath sounds in all lung fields, scattered crackles. Gastrointestinal: No TTP.  Normoactive bowel sounds. MSK: Moving all extremities appropriate. Extremities: 2+ peripheral edema dependently up to hips bilaterally.  Wearing tight Ace wrap on bilateral lower extremities up to knees.  Capillary refill 2 seconds. Derm: Pale, dry skin.  No rashes grossly. Neuro: No focal neurologic status. Psych: Pleasant, appropriate.  Full range active.  Conversing appropriately.  Attention and concentration normal.  Alert and oriented x 4.  Labs:  CBC BMET  Recent Labs  Lab 04/10/23 1837  WBC 9.2  HGB 7.9*  HCT 26.7*  PLT 161   Recent Labs  Lab 04/10/23 1837  NA 140  K 3.7  CL 101  CO2 31  BUN 33*  CREATININE 2.43*  GLUCOSE 115*  CALCIUM 8.7*     Pertinent additional labs: -BNP 591 -RPP negative  EKG pending   Imaging Studies: -CXR: Diffuse fluffy opacities and lung  markings.  Increased bibasilar opacification, atelectasis versus infiltrate.  Sharp costophrenic angles.  Independently reviewed and agree with radiologist's interpretation.  Layla Gramm Sharion Dove, MD 04/10/2023, 8:35 PM  PGY-1, [facility] FPTS Intern pager: [phone], text pages welcome Secure chat group [place] [facility]   I have evaluated this patient along with Dr. Sharion Dove and reviewed the above note, making necessary revisions.  Dorothyann Gibbs, MD 04/11/2023, 1:44 AM PGY-3, [facility]

## 2023-04-10 NOTE — Telephone Encounter (Signed)
Hemoglobin is low again. Dropped from 11 to 8. This may explain why the CHF is worse again. Please stop eliquis. Decrease the torsemide to 40 mg  twice daily. Recheck hemoglobin and BMET after the holiday weekend. Is the weeping from the legs any better? If she has dyspnea  or overt bleeding, please take to hospital

## 2023-04-10 NOTE — ED Notes (Signed)
ED TO INPATIENT HANDOFF REPORT  ED Nurse Name and Phone #: Amil Amen [phone]  S Name/Age/Gender Tonya Glass 77 y.o. female Room/Bed: 001C/001C  Code Status   Code Status: Prior  Home/SNF/Other Nursing Home Patient oriented to: self, place, time, and situation Is this baseline? Yes   Triage Complete: Triage complete  Chief Complaint Acute exacerbation of CHF (congestive heart failure) (HCC) [I50.9]  Triage Note No notes on file   Allergies No Known Allergies  Level of Care/Admitting Diagnosis ED Disposition     ED Disposition  Admit   Condition  --   Comment  Hospital [facility] [[id number]]  Level of Care: Progressive [102]  Admit to Progressive based on following criteria: RESPIRATORY PROBLEMS hypoxemic/hypercapnic respiratory failure that is responsive to NIPPV (BiPAP) or High Flow Nasal Cannula (6-80 lpm). Frequent assessment/intervention, no > Q2 hrs < Q4 hrs, to maintain oxygenation and pulmonary hygiene.  May admit patient to [facility] or [facility] if equivalent level of care is available:: No  Covid Evaluation: Confirmed COVID Negative  Diagnosis: Acute exacerbation of CHF (congestive heart failure) [facility]) [[id number]]  Admitting Physician: Nelia Shi [[id number]]  Attending Physician: Nestor Ramp [[id number]]  Certification:: I certify this patient will need inpatient services for at least 2 midnights  Expected Medical Readiness: 04/14/2023          B Medical/Surgery History Past Medical History:  Diagnosis Date   Anxiety    Aortic valve stenosis 10/10/2016   Arthritis    "fingers" (07/24/2017)   CHF (congestive heart failure) (HCC)    COPD (chronic obstructive pulmonary disease) (HCC)    Depression    Gallstones    Heart murmur    High cholesterol    History of kidney stones    Hypertension    Hypothyroidism    Migraines    "none in years til 01/2013-02/2013; none since"  (07/24/2017)   Orthopnoea    "just since open heart  surgery" (02/25/2013)   Pacemaker    [employer]    Pneumonia ~ 2012   "walking pneumonia"   Pneumonia 07/23/2017   Stroke (HCC) 2001   denies residual on 02/25/2013   Symptomatic hypotension 10/10/2016   Syncope and collapse    "prior to open heart surgery" (02/25/2013)   Type II diabetes mellitus (HCC)    Past Surgical History:  Procedure Laterality Date   AORTIC ARCH ANGIOGRAPHY N/A 08/07/2021   Procedure: AORTIC ARCH ANGIOGRAPHY;  Surgeon: Tonny Bollman, MD;  [facility] INVASIVE CV LAB;  Service: Cardiovascular;  Laterality: N/A;   BREAST SURGERY Right    "clamped blood vessel and stitched it up; in [place]"   CARDIAC CATHETERIZATION     CARDIAC VALVE REPLACEMENT  01/26/2013   "cow valve put in; widened the second valve" (02/25/2013) - [place], [place]   CATARACT EXTRACTION W/ INTRAOCULAR LENS  IMPLANT, BILATERAL Bilateral 04/2017   CHOLECYSTECTOMY OPEN  1971   CYST EXCISION     "taken off my chest"   INSERT / REPLACE / REMOVE PACEMAKER  01/19/2013   [employer] - dual chamber   LAPAROSCOPIC GASTRIC SLEEVE RESECTION  2009   in [place]   PPM GENERATOR CHANGEOUT N/A 06/22/2022   Procedure: PPM GENERATOR CHANGEOUT;  Surgeon: Duke Salvia, MD;  Location: [facility] INVASIVE CV LAB;  Service: Cardiovascular;  Laterality: N/A;   RIGHT/LEFT HEART CATH AND CORONARY ANGIOGRAPHY N/A 08/07/2021   Procedure: RIGHT/LEFT HEART CATH AND CORONARY ANGIOGRAPHY;  Surgeon: Tonny Bollman, MD;  Location: [facility]  INVASIVE CV LAB;  Service: Cardiovascular;  Laterality: N/A;   TEE WITHOUT CARDIOVERSION N/A 06/13/2021   Procedure: TRANSESOPHAGEAL ECHOCARDIOGRAM (TEE);  Surgeon: Thurmon Fair, MD;  Location: [facility] ENDOSCOPY;  Service: Cardiovascular;  Laterality: N/A;   VEIN SURGERY Left    LLE; cut leg open and took out piece of vein that was protruding thru skin; stitched it up; done in [place]     A IV Location/Drains/Wounds Patient Lines/Drains/Airways Status     Active Line/Drains/Airways      Name Placement date Placement time Site Days   Peripheral IV 04/10/23 20 G Anterior;Right Forearm 04/10/23  1842  Forearm  less than 1   Pressure Injury 06/21/22 Sacrum Right Stage 2 -  Partial thickness loss of dermis presenting as a shallow open injury with a red, pink wound bed without slough. quarter-sized, red, partial thickness loss of dermis 06/21/22  2100  -- 293   Pressure Injury 06/21/22 Sacrum Right;Left Stage 1 -  Intact skin with non-blanchable redness of a localized area usually over a bony prominence. Red, non-blanchable 06/21/22  2100  -- 293   Wound / Incision (Open or Dehisced) 06/21/22 Irritant Dermatitis (Moisture Associated Skin Damage) Groin Anterior;Bilateral 06/21/22  2100  Groin  293            Intake/Output Last 24 hours No intake or output data in the 24 hours ending 04/10/23 2128  Labs/Imaging Results for orders placed or performed during the hospital encounter of 04/10/23 (from the past 48 hour(s))  CBC with Differential     Status: Abnormal   Collection Time: 04/10/23  6:37 PM  Result Value Ref Range   WBC 9.2 4.0 - 10.5 K/uL   RBC 2.72 (L) 3.87 - 5.11 MIL/uL   Hemoglobin 7.9 (L) 12.0 - 15.0 g/dL   HCT 81.1 (L) 91.4 - 78.2 %   MCV 98.2 80.0 - 100.0 fL   MCH 29.0 26.0 - 34.0 pg   MCHC 29.6 (L) 30.0 - 36.0 g/dL   RDW 95.6 21.3 - 08.6 %   Platelets 161 150 - 400 K/uL   nRBC 0.0 0.0 - 0.2 %   Neutrophils Relative % 67 %   Neutro Abs 6.1 1.7 - 7.7 K/uL   Lymphocytes Relative 21 %   Lymphs Abs 2.0 0.7 - 4.0 K/uL   Monocytes Relative 7 %   Monocytes Absolute 0.6 0.1 - 1.0 K/uL   Eosinophils Relative 5 %   Eosinophils Absolute 0.5 0.0 - 0.5 K/uL   Basophils Relative 0 %   Basophils Absolute 0.0 0.0 - 0.1 K/uL   Immature Granulocytes 0 %   Abs Immature Granulocytes 0.02 0.00 - 0.07 K/uL    Comment: Performed at [facility], [address] [place], [place] [address]  Basic metabolic panel     Status: Abnormal   Collection Time: 04/10/23  6:37 PM   Result Value Ref Range   Sodium 140 135 - 145 mmol/L   Potassium 3.7 3.5 - 5.1 mmol/L   Chloride 101 98 - 111 mmol/L   CO2 31 22 - 32 mmol/L   Glucose, Bld 115 (H) 70 - 99 mg/dL    Comment: Glucose reference range applies only to samples taken after fasting for at least 8 hours.   BUN 33 (H) 8 - 23 mg/dL   Creatinine, Ser 9.62 (H) 0.44 - 1.00 mg/dL   Calcium 8.7 (L) 8.9 - 10.3 mg/dL   GFR, Estimated 20 (L) >60 mL/min    Comment: (NOTE)  Calculated using the CKD-EPI Creatinine Equation (2021)    Anion gap 8 5 - 15    Comment: Performed at [facility], [address] [place], [place] [address]  Resp panel by RT-PCR (RSV, Flu A&B, Covid) Anterior Nasal Swab     Status: None   Collection Time: 04/10/23  6:37 PM   Specimen: Anterior Nasal Swab  Result Value Ref Range   SARS Coronavirus 2 by RT PCR NEGATIVE NEGATIVE   Influenza A by PCR NEGATIVE NEGATIVE   Influenza B by PCR NEGATIVE NEGATIVE    Comment: (NOTE) The Xpert Xpress SARS-CoV-2/FLU/RSV plus assay is intended as an aid in the diagnosis of influenza from Nasopharyngeal swab specimens and should not be used as a sole basis for treatment. Nasal washings and aspirates are unacceptable for Xpert Xpress SARS-CoV-2/FLU/RSV testing.  Fact Sheet for Patients: BloggerCourse.com  Fact Sheet for Healthcare Providers: SeriousBroker.it  This test is not yet approved or cleared by the [place] FDA and has been authorized for detection and/or diagnosis of SARS-CoV-2 by FDA under an Emergency Use Authorization (EUA). This EUA will remain in effect (meaning this test can be used) for the duration of the COVID-19 declaration under Section 564(b)(1) of the Act, 21 U.S.C. section 360bbb-3(b)(1), unless the authorization is terminated or revoked.     Resp Syncytial Virus by PCR NEGATIVE NEGATIVE    Comment: (NOTE) Fact Sheet for  Patients: BloggerCourse.com  Fact Sheet for Healthcare Providers: SeriousBroker.it  This test is not yet approved or cleared by the [place] FDA and has been authorized for detection and/or diagnosis of SARS-CoV-2 by FDA under an Emergency Use Authorization (EUA). This EUA will remain in effect (meaning this test can be used) for the duration of the COVID-19 declaration under Section 564(b)(1) of the Act, 21 U.S.C. section 360bbb-3(b)(1), unless the authorization is terminated or revoked.  Performed at [facility], [address] [place], [place] [address]   Brain natriuretic peptide     Status: Abnormal   Collection Time: 04/10/23  6:37 PM  Result Value Ref Range   B Natriuretic Peptide 591.4 (H) 0.0 - 100.0 pg/mL    Comment: Performed at [facility], [address], [place], [place] [address]   DG Chest Portable 1 View  Result Date: 04/10/2023 CLINICAL DATA:  Shortness of breath. EXAM: PORTABLE CHEST 1 VIEW COMPARISON:  June 21, 2022 FINDINGS: There is stable dual lead AICD positioning. Multiple sternal wires are present. An artificial aortic valve is also seen. The cardiac silhouette is mildly enlarged and unchanged in size. Mild diffuse, chronic appearing increased lung markings are noted. Mild superimposed areas of atelectasis and/or infiltrate are seen within the mid lung fields and bilateral lung bases. No pleural effusion or pneumothorax is identified. Multilevel degenerative changes are seen throughout the thoracic spine. IMPRESSION: Mild diffuse, chronic appearing increased lung markings with mild superimposed areas of mid lung field and bibasilar atelectasis and/or infiltrate. Electronically Signed   By: Aram Candela M.D.   On: 04/10/2023 18:58    Pending Labs Unresulted Labs (From admission, onward)     Start     Ordered   04/10/23 2019  Blood culture (routine x 2)  BLOOD CULTURE X 2,   R (with  STAT occurrences)      04/10/23 2018            Vitals/Pain Today's Vitals   04/10/23 2030 04/10/23 2045 04/10/23 2100 04/10/23 2115  BP: (!) 121/49 Marland Kitchen)  110/41 (!) 97/59 124/65  Pulse: 73 68 68 68  Resp: (!) 22 20 (!) 21 16  Temp:      TempSrc:      SpO2: 96% 100% 100% 100%  Weight:      Height:      PainSc:        Isolation Precautions No active isolations  Medications Medications  furosemide (LASIX) injection 80 mg (80 mg Intravenous Given 04/10/23 2025)    Mobility non-ambulatory     Focused Assessments Pulmonary Assessment Handoff:  Lung sounds:   O2 Device: (S) Bi-PAP O2 Flow Rate (L/min): 6 L/min    R Recommendations: See Admitting Provider Note  Report given to:   Additional Notes: Given 80mg  Lasix, has purewick. On BIPAP

## 2023-04-11 ENCOUNTER — Inpatient Hospital Stay (HOSPITAL_COMMUNITY): Payer: 59

## 2023-04-11 ENCOUNTER — Encounter (HOSPITAL_COMMUNITY): Payer: Self-pay | Admitting: Family Medicine

## 2023-04-11 DIAGNOSIS — J441 Chronic obstructive pulmonary disease with (acute) exacerbation: Secondary | ICD-10-CM | POA: Insufficient documentation

## 2023-04-11 DIAGNOSIS — J411 Mucopurulent chronic bronchitis: Secondary | ICD-10-CM

## 2023-04-11 DIAGNOSIS — I2781 Cor pulmonale (chronic): Secondary | ICD-10-CM | POA: Diagnosis not present

## 2023-04-11 DIAGNOSIS — N184 Chronic kidney disease, stage 4 (severe): Secondary | ICD-10-CM | POA: Insufficient documentation

## 2023-04-11 DIAGNOSIS — I513 Intracardiac thrombosis, not elsewhere classified: Secondary | ICD-10-CM | POA: Diagnosis not present

## 2023-04-11 DIAGNOSIS — Z953 Presence of xenogenic heart valve: Secondary | ICD-10-CM

## 2023-04-11 DIAGNOSIS — I429 Cardiomyopathy, unspecified: Secondary | ICD-10-CM | POA: Diagnosis not present

## 2023-04-11 DIAGNOSIS — Z950 Presence of cardiac pacemaker: Secondary | ICD-10-CM

## 2023-04-11 DIAGNOSIS — Z952 Presence of prosthetic heart valve: Secondary | ICD-10-CM

## 2023-04-11 DIAGNOSIS — Z7984 Long term (current) use of oral hypoglycemic drugs: Secondary | ICD-10-CM

## 2023-04-11 DIAGNOSIS — K219 Gastro-esophageal reflux disease without esophagitis: Secondary | ICD-10-CM | POA: Insufficient documentation

## 2023-04-11 DIAGNOSIS — N179 Acute kidney failure, unspecified: Secondary | ICD-10-CM | POA: Insufficient documentation

## 2023-04-11 DIAGNOSIS — I50813 Acute on chronic right heart failure: Secondary | ICD-10-CM | POA: Diagnosis not present

## 2023-04-11 DIAGNOSIS — J189 Pneumonia, unspecified organism: Secondary | ICD-10-CM

## 2023-04-11 DIAGNOSIS — I5021 Acute systolic (congestive) heart failure: Secondary | ICD-10-CM

## 2023-04-11 DIAGNOSIS — N1832 Chronic kidney disease, stage 3b: Secondary | ICD-10-CM

## 2023-04-11 DIAGNOSIS — D649 Anemia, unspecified: Secondary | ICD-10-CM | POA: Diagnosis not present

## 2023-04-11 DIAGNOSIS — J449 Chronic obstructive pulmonary disease, unspecified: Secondary | ICD-10-CM | POA: Insufficient documentation

## 2023-04-11 LAB — ECHOCARDIOGRAM COMPLETE
AR max vel: 1.72 cm2
AV Area VTI: 1.95 cm2
AV Area mean vel: 1.92 cm2
AV Mean grad: 19 mm[Hg]
AV Peak grad: 32.7 mm[Hg]
Ao pk vel: 2.86 m/s
Area-P 1/2: 4.63 cm2
Height: 60 in
S' Lateral: 3.3 cm
Weight: 3862.46 [oz_av]

## 2023-04-11 LAB — PROTIME-INR
INR: 1.5 — ABNORMAL HIGH (ref 0.8–1.2)
Prothrombin Time: 18.7 s — ABNORMAL HIGH (ref 11.4–15.2)

## 2023-04-11 LAB — CBC
HCT: 24.5 % — ABNORMAL LOW (ref 36.0–46.0)
HCT: 25.5 % — ABNORMAL LOW (ref 36.0–46.0)
Hemoglobin: 7.4 g/dL — ABNORMAL LOW (ref 12.0–15.0)
Hemoglobin: 8 g/dL — ABNORMAL LOW (ref 12.0–15.0)
MCH: 29.7 pg (ref 26.0–34.0)
MCH: 29.6 pg (ref 26.0–34.0)
MCHC: 30.2 g/dL (ref 30.0–36.0)
MCHC: 31.4 g/dL (ref 30.0–36.0)
MCV: 98.4 fL (ref 80.0–100.0)
MCV: 94.4 fL (ref 80.0–100.0)
Platelets: 142 10*3/uL — ABNORMAL LOW (ref 150–400)
Platelets: 138 10*3/uL — ABNORMAL LOW (ref 150–400)
RBC: 2.49 MIL/uL — ABNORMAL LOW (ref 3.87–5.11)
RBC: 2.7 MIL/uL — ABNORMAL LOW (ref 3.87–5.11)
RDW: 14.6 % (ref 11.5–15.5)
RDW: 14.6 % (ref 11.5–15.5)
WBC: 7.6 10*3/uL (ref 4.0–10.5)
WBC: 10.5 10*3/uL (ref 4.0–10.5)
nRBC: 0 % (ref 0.0–0.2)
nRBC: 0 % (ref 0.0–0.2)

## 2023-04-11 LAB — GLUCOSE, CAPILLARY
Glucose-Capillary: 132 mg/dL — ABNORMAL HIGH (ref 70–99)
Glucose-Capillary: 122 mg/dL — ABNORMAL HIGH (ref 70–99)
Glucose-Capillary: 108 mg/dL — ABNORMAL HIGH (ref 70–99)
Glucose-Capillary: 129 mg/dL — ABNORMAL HIGH (ref 70–99)
Glucose-Capillary: 151 mg/dL — ABNORMAL HIGH (ref 70–99)

## 2023-04-11 LAB — TSH: TSH: 5.259 u[IU]/mL — ABNORMAL HIGH (ref 0.350–4.500)

## 2023-04-11 LAB — IRON AND TIBC
Iron: 30 ug/dL (ref 28–170)
Saturation Ratios: 11 % (ref 10.4–31.8)
TIBC: 286 ug/dL (ref 250–450)
UIBC: 256 ug/dL

## 2023-04-11 LAB — BASIC METABOLIC PANEL
Anion gap: 9 (ref 5–15)
BUN: 34 mg/dL — ABNORMAL HIGH (ref 8–23)
CO2: 29 mmol/L (ref 22–32)
Calcium: 8.1 mg/dL — ABNORMAL LOW (ref 8.9–10.3)
Chloride: 101 mmol/L (ref 98–111)
Creatinine, Ser: 2.58 mg/dL — ABNORMAL HIGH (ref 0.44–1.00)
GFR, Estimated: 19 mL/min — ABNORMAL LOW (ref >60)
Glucose, Bld: 146 mg/dL — ABNORMAL HIGH (ref 70–99)
Potassium: 4.2 mmol/L (ref 3.5–5.1)
Sodium: 139 mmol/L (ref 135–145)

## 2023-04-11 LAB — APTT: aPTT: 38 s — ABNORMAL HIGH (ref 24–36)

## 2023-04-11 LAB — MAGNESIUM: Magnesium: 1.9 mg/dL (ref 1.7–2.4)

## 2023-04-11 LAB — HEMOGLOBIN A1C
Hgb A1c MFr Bld: 6 % — ABNORMAL HIGH (ref 4.8–5.6)
Mean Plasma Glucose: 125.5 mg/dL

## 2023-04-11 LAB — FERRITIN: Ferritin: 74 ng/mL (ref 11–307)

## 2023-04-11 MED ORDER — FUROSEMIDE 10 MG/ML IJ SOLN
10.0000 mg/h | INTRAVENOUS | Status: DC
  Administered 2023-04-11 – 2023-04-15 (×5): 10 mg/h via INTRAVENOUS
  Filled 2023-04-11 (×5): qty 20

## 2023-04-11 MED ORDER — HEPARIN (PORCINE) 25000 UT/250ML-% IV SOLN
1250.0000 [IU]/h | INTRAVENOUS | Status: DC
  Administered 2023-04-11: 1000 [IU]/h via INTRAVENOUS
  Administered 2023-04-12: 1050 [IU]/h via INTRAVENOUS
  Administered 2023-04-14: 1150 [IU]/h via INTRAVENOUS
  Administered 2023-04-16: 1250 [IU]/h via INTRAVENOUS
  Filled 2023-04-11 (×6): qty 250

## 2023-04-11 MED ORDER — FUROSEMIDE 10 MG/ML IJ SOLN
80.0000 mg | Freq: Once | INTRAMUSCULAR | Status: AC
  Administered 2023-04-11: 80 mg via INTRAVENOUS
  Filled 2023-04-11: qty 8

## 2023-04-11 MED ORDER — METOLAZONE 2.5 MG PO TABS
2.5000 mg | ORAL_TABLET | Freq: Once | ORAL | Status: AC
  Administered 2023-04-11: 2.5 mg via ORAL
  Filled 2023-04-11: qty 1

## 2023-04-11 MED ORDER — PERFLUTREN LIPID MICROSPHERE
1.0000 mL | INTRAVENOUS | Status: AC | PRN
  Administered 2023-04-11: 2 mL via INTRAVENOUS

## 2023-04-11 NOTE — Assessment & Plan Note (Signed)
Last A1c 6.16 Sep 2022.  Chart review shows home meds are 12 units Lantus nightly with sliding scale Humalog BID, glipizide 10 mg BID, and Jardiance 10 mg daily. -CBGs Q4h on BiPAP, then transition to ACHS and AM -Sensitive SSI -Dose long-acting insulin after trending CBGs -Hold home Jardiance 10 mg in setting of AKI -Hold home glipizide 10 mg BID while with poor PO intake to avoid hypoglycemia -Follow-up A1c

## 2023-04-11 NOTE — Assessment & Plan Note (Addendum)
Patient significantly volume overloaded on exam.  Weight was under 215 lbs on 8/12 per cardiology note, but over 245 lbs in the ED today.  Last EF 60-65% January 2023.  Status post 80 mg Lasix IV in ED, will follow UOP. -Admit to inpatient, progressive care, attending Dr. Jennette Kettle -PT OT to evaluate and treat -Daily weights, strict I&Os -Redose Lasix in AM -Hold carvedilol given decompensated heart failure, restart as able -Follow-up TTE -Consider cardiology consult for CHF in AM -TSH pending

## 2023-04-11 NOTE — Consult Note (Addendum)
CARDIOLOGY CONSULT NOTE    Patient ID: Tonya Glass; [id number]; 1946-01-30   Admit date: 04/10/2023 Date of Consult: 04/11/2023  Primary Care Provider: No primary care provider on file. Primary Cardiologist:  Primary Electrophysiologist:     Patient Profile:   Tonya Glass is a 77 y.o. female who is being seen today for the evaluation of CHF at the request of Dr Jennette Kettle.  History of Present Illness:   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  After arrival to the ER, BNP was elevated 591, LFTs were not performed, and chest x-ray showed infiltrate and pulmonary edema.  Echocardiogram performed today showed LVEF 35 to 40% with RWMA (hypokinesis/akinesis of mid to distal inferoseptal wall and distal anteroseptal wall), small LV thrombus, normal RV function, mild MR, well-seated bioprosthetic aortic valve with no paravalvular regurgitation and CVP 3 mmHg.  Past Medical History:  Diagnosis Date   Anxiety    Aortic valve stenosis 10/10/2016   Arthritis    "fingers" (07/24/2017)   CHF (congestive heart failure) (HCC)    COPD (chronic obstructive pulmonary disease) (HCC)    Depression    Gallstones    Heart murmur    High cholesterol    History of kidney stones    Hypertension    Hypothyroidism    Migraines    "none in years til 01/2013-02/2013; none since"  (07/24/2017)   Orthopnoea    "just since open heart surgery" (02/25/2013)   Pacemaker    [employer]    Pneumonia ~ 2012   "walking pneumonia"   Pneumonia 07/23/2017   Pneumonia 07/23/2017   Stroke (HCC) 2001    denies residual on 02/25/2013   Symptomatic hypotension 10/10/2016   Syncope and collapse    "prior to open heart surgery" (02/25/2013)   Type II diabetes mellitus (HCC)     Past Surgical History:  Procedure Laterality Date   AORTIC ARCH ANGIOGRAPHY N/A 08/07/2021   Procedure: AORTIC ARCH ANGIOGRAPHY;  Surgeon: Tonny Bollman, MD;  Location: [facility] INVASIVE CV LAB;  Service: Cardiovascular;  Laterality: N/A;   BREAST SURGERY Right    "clamped blood vessel and stitched it up; in [place]"   CARDIAC CATHETERIZATION     CARDIAC VALVE REPLACEMENT  01/26/2013   "cow valve put in; widened the second valve" (02/25/2013) - [place], [place]   CATARACT EXTRACTION W/ INTRAOCULAR LENS  IMPLANT, BILATERAL Bilateral 04/2017   CHOLECYSTECTOMY OPEN  1971   CYST EXCISION     "taken off my chest"   INSERT / REPLACE / REMOVE PACEMAKER  01/19/2013   [employer] - dual chamber   LAPAROSCOPIC GASTRIC SLEEVE RESECTION  2009   in [place]   PPM GENERATOR CHANGEOUT N/A 06/22/2022   Procedure: PPM GENERATOR CHANGEOUT;  Surgeon: Duke Salvia, MD;  Location: [facility] INVASIVE CV LAB;  Service: Cardiovascular;  Laterality: N/A;   RIGHT/LEFT HEART CATH AND CORONARY ANGIOGRAPHY N/A 08/07/2021   Procedure: RIGHT/LEFT HEART CATH AND CORONARY ANGIOGRAPHY;  Surgeon: Tonny Bollman, MD;  Location: [facility] INVASIVE CV LAB;  Service: Cardiovascular;  Laterality: N/A;   TEE WITHOUT CARDIOVERSION N/A 06/13/2021   Procedure: TRANSESOPHAGEAL ECHOCARDIOGRAM (TEE);  Surgeon: Thurmon Fair, MD;  Location: [facility] ENDOSCOPY;  Service: Cardiovascular;  Laterality: N/A;   VEIN SURGERY Left    LLE; cut leg open and took out piece of vein that was protruding thru skin; stitched it up; done in [place]       Inpatient Medications: Scheduled Meds:  atorvastatin  40 mg Oral Daily   ferrous sulfate  325 mg Oral Q1200   FLUoxetine  40 mg Oral Daily   gabapentin  600 mg Oral QHS   guaiFENesin  600 mg Oral BID   insulin aspart  0-9 Units Subcutaneous  TID WC   irbesartan  150 mg Oral Daily   levothyroxine  75 mcg Oral Daily   mometasone-formoterol  2 puff Inhalation BID   multivitamin with minerals  1 tablet Oral Daily   pantoprazole  40 mg Oral BID   sodium chloride flush  3 mL Intravenous Q12H   sucralfate  1 g Oral TID WC & HS   traZODone  50 mg Oral QHS   Continuous Infusions:  sodium chloride     furosemide (LASIX) 200 mg in dextrose 5 % 100 mL (2 mg/mL) infusion 10 mg/hr (04/11/23 1429)   heparin     PRN Meds: sodium chloride, acetaminophen **OR** acetaminophen, ipratropium-albuterol, sodium chloride flush  Allergies:   No Known Allergies  Social History:   Social History   Socioeconomic History   Marital status: Widowed    Spouse name: Not on file   Number of children: 2   Years of education: dental sch   Highest education level: Not on file  Occupational History   Occupation: retired  Tobacco Use   Smoking status: Former    Current packs/day: 0.00    Average packs/day: 2.0 packs/day for 40.0 years (80.0 ttl pk-yrs)    Types: Cigarettes    Start date: 05/29/1959    Quit date: 05/29/1999    Years since quitting: 23.8   Smokeless tobacco: Never  Vaping Use   Vaping status: Never Used  Substance and Sexual Activity   Alcohol use: No   Drug use: No   Sexual activity: Yes  Other Topics Concern   Not on file  Social History Narrative   Not on file   Social Determinants of Health   Financial Resource Strain: Not on file  Food Insecurity: No Food Insecurity (06/22/2022)   Hunger Vital Sign    Worried About Running Out of Food in the Last Year: Never true    Ran Out of Food in the Last Year: Never true  Transportation Needs: No Transportation Needs (06/22/2022)   PRAPARE - Administrator, Civil Service (Medical): No    Lack of Transportation (Non-Medical): No  Physical Activity: Not on file  Stress: Not on file  Social Connections: Unknown (09/25/2021)   Received from [facility]    Social Network    Social Network: Not on file  Intimate Partner Violence: Not At Risk (06/22/2022)   Humiliation, Afraid, Rape, and Kick questionnaire    Fear of Current or Ex-Partner: No    Emotionally Abused: No    Physically Abused: No    Sexually Abused: No    Family History:    Family History  Problem Relation Age of Onset   Lung cancer Mother    Heart Problems Father    Hypertension Brother    Colon polyps Brother    Skin cancer Brother    Asthma Brother    Colon polyps Daughter  Diabetes Maternal Aunt    Breast cancer Maternal Aunt        mets to liver   Diabetes Paternal Aunt      ROS:  Please see the history of present illness.  ROS  All other ROS reviewed and negative.     Physical Exam/Data:   Vitals:   04/11/23 0343 04/11/23 0800 04/11/23 0803 04/11/23 1125  BP: (!) 113/53 (!) 113/46  (!) 108/39  Pulse: 62 (!) 59 (!) 59 60  Resp: 20 16 17 14   Temp: 99.1 F (37.3 C) 98.5 F (36.9 C)  98.5 F (36.9 C)  TempSrc: Axillary Axillary  Oral  SpO2: 100% 99% 100% 100%  Weight: 109.5 kg     Height:        Intake/Output Summary (Last 24 hours) at 04/11/2023 1456 Last data filed at 04/11/2023 1221 Gross per 24 hour  Intake 243 ml  Output 1400 ml  Net -1157 ml   Filed Weights   04/10/23 1750 04/10/23 2219 04/11/23 0343  Weight: 108.4 kg 109.8 kg 109.5 kg   Body mass index is 47.15 kg/m.  General:  Well nourished, well developed, in no acute distress HEENT: normal Lymph: no adenopathy Neck: JVD elevated Endocrine:  No thryomegaly Vascular: No carotid bruits; FA pulses 2+ bilaterally without bruits  Cardiac:  normal S1, S2; RRR; no murmur  Lungs:  clear to auscultation bilaterally, no wheezing, rhonchi or rales  Abd: soft, nontender, no hepatomegaly  Ext: 1+ pitting edema Musculoskeletal:  No deformities, BUE and BLE strength normal and equal Skin: warm and dry  Neuro:  CNs 2-12 intact, no focal abnormalities noted Psych:  Normal affect    EKG:  The EKG was personally reviewed and demonstrates: AV dual paced rhythm Telemetry:  Telemetry was personally reviewed and demonstrates: AV dual paced rhythm   Laboratory Data:  Chemistry Recent Labs  Lab 04/10/23 1837 04/11/23 0629  NA 140 139  K 3.7 4.2  CL 101 101  CO2 31 29  GLUCOSE 115* 146*  BUN 33* 34*  CREATININE 2.43* 2.58*  CALCIUM 8.7* 8.1*  GFRNONAA 20* 19*  ANIONGAP 8 9    No results for input(s): "PROT", "ALBUMIN", "AST", "ALT", "ALKPHOS", "BILITOT" in the last 168 hours. Hematology Recent Labs  Lab 04/10/23 1837 04/11/23 0629  WBC 9.2 7.6  RBC 2.72* 2.49*  HGB 7.9* 7.4*  HCT 26.7* 24.5*  MCV 98.2 98.4  MCH 29.0 29.7  MCHC 29.6* 30.2  RDW 14.7 14.6  PLT 161 142*   Cardiac EnzymesNo results for input(s): "TROPONINI" in the last 168 hours. No results for input(s): "TROPIPOC" in the last 168 hours.  BNP Recent Labs  Lab 04/10/23 1837  BNP 591.4*    DDimer No results for input(s): "DDIMER" in the last 168 hours.  Radiology/Studies:  ECHOCARDIOGRAM COMPLETE  Result Date: 04/11/2023    ECHOCARDIOGRAM REPORT   Patient Name:   KASSADI GELLINGS Date of Exam: 04/11/2023 Medical Rec #:  [id number]      Height:       60.0 in Accession #:    [id number]     Weight:       241.4 lb Date of Birth:  Mar 18, 1946      BSA:          2.022 m Patient Age:    77 years       BP:           113/53 mmHg Patient Gender: F  HR:           60 bpm. Exam Location:  Inpatient Procedure: 2D Echo, Cardiac Doppler, Color Doppler and Intracardiac            Opacification Agent Indications:     Congestive Heart Failure I50.9  History:         Patient has prior history of Echocardiogram examinations, most                  recent 06/13/2021. CHF, COPD and Stroke, Aortic Valve Disease;                  Risk Factors:Diabetes and Hypertension.  Sonographer:     Darlys Gales Referring Phys:  [id number] SARA L NEAL Diagnosing Phys: Dorthula Nettles IMPRESSIONS  1. Left ventricular ejection  fraction, by estimation, is 35 to 40%. The left ventricle has moderately decreased function. The left ventricle demonstrates regional wall motion abnormalities with hypokinesis of the mid to distal inferoseptal wall and distal anteroseptal wall; however, complete wall motion assessment limited by poor visualization of endocardial borders and off axis angles. There is a small LV apical thrombus.  2. Right ventricular systolic function is normal. The right ventricular size is normal.  3. Left atrial size was mildly dilated.  4. The mitral valve is normal in structure. Mild mitral valve regurgitation. No evidence of mitral stenosis.  5. Well seated bioprosthetic aortic valve with no paravalvular regurgitation. Aortic valve mean gradient measures 19.0 mmHg.  6. The inferior vena cava is normal in size with greater than 50% respiratory variability, suggesting right atrial pressure of 3 mmHg. FINDINGS  Left Ventricle: Left ventricular ejection fraction, by estimation, is 35 to 40%. The left ventricle has moderately decreased function. The left ventricle demonstrates regional wall motion abnormalities. Definity contrast agent was given IV to delineate the left ventricular endocardial borders. The left ventricular internal cavity size was normal in size. There is no left ventricular hypertrophy. Left ventricular diastolic function could not be evaluated due to paced rhythm. Left ventricular diastolic function could not be evaluated.  LV Wall Scoring: The mid and distal anterior septum, mid inferoseptal segment, apical anterior segment, apical inferior segment, and apex are hypokinetic. Right Ventricle: The right ventricular size is normal. No increase in right ventricular wall thickness. Right ventricular systolic function is normal. Left Atrium: Left atrial size was mildly dilated. Right Atrium: Right atrial size was normal in size. Pericardium: There is no evidence of pericardial effusion. Mitral Valve: The mitral valve  is normal in structure. Mild mitral annular calcification. Mild mitral valve regurgitation. No evidence of mitral valve stenosis. Tricuspid Valve: The tricuspid valve is normal in structure. Tricuspid valve regurgitation is not demonstrated. No evidence of tricuspid stenosis. Aortic Valve: The aortic valve has been repaired/replaced. Aortic valve regurgitation is mild. Mild aortic stenosis is present. Aortic valve mean gradient measures 19.0 mmHg. Aortic valve peak gradient measures 32.7 mmHg. Aortic valve area, by VTI measures 1.95 cm. There is a bioprosthetic valve present in the aortic position. Pulmonic Valve: The pulmonic valve was normal in structure. Pulmonic valve regurgitation is not visualized. No evidence of pulmonic stenosis. Aorta: The aortic root is normal in size and structure. Venous: The inferior vena cava is normal in size with greater than 50% respiratory variability, suggesting right atrial pressure of 3 mmHg. IAS/Shunts: No atrial level shunt detected by color flow Doppler.  LEFT VENTRICLE PLAX 2D LVIDd:         4.80 cm   Diastology  LVIDs:         3.30 cm   LV e' medial:    5.98 cm/s LV PW:         1.00 cm   LV E/e' medial:  19.9 LV IVS:        1.20 cm   LV e' lateral:   7.29 cm/s LVOT diam:     1.90 cm   LV E/e' lateral: 16.3 LV SV:         134 LV SV Index:   66 LVOT Area:     2.84 cm  LEFT ATRIUM             Index LA Vol (A2C):   72.2 ml 35.71 ml/m LA Vol (A4C):   66.9 ml 33.09 ml/m LA Biplane Vol: 72.0 ml 35.61 ml/m  AORTIC VALVE AV Area (Vmax):    1.72 cm AV Area (Vmean):   1.92 cm AV Area (VTI):     1.95 cm AV Vmax:           286.00 cm/s AV Vmean:          207.000 cm/s AV VTI:            0.688 m AV Peak Grad:      32.7 mmHg AV Mean Grad:      19.0 mmHg LVOT Vmax:         173.00 cm/s LVOT Vmean:        140.000 cm/s LVOT VTI:          0.473 m LVOT/AV VTI ratio: 0.69 MITRAL VALVE                TRICUSPID VALVE MV Area (PHT): 4.63 cm     TR Peak grad:   16.8 mmHg MV Decel Time: 164  msec     TR Vmax:        205.00 cm/s MV E velocity: 119.00 cm/s MV A velocity: 47.70 cm/s   SHUNTS MV E/A ratio:  2.49         Systemic VTI:  0.47 m                             Systemic Diam: 1.90 cm Aditya Sabharwal Electronically signed by Dorthula Nettles Signature Date/Time: 04/11/2023/1:20:03 PM    Final (Updated)    DG Chest Portable 1 View  Result Date: 04/10/2023 CLINICAL DATA:  Shortness of breath. EXAM: PORTABLE CHEST 1 VIEW COMPARISON:  June 21, 2022 FINDINGS: There is stable dual lead AICD positioning. Multiple sternal wires are present. An artificial aortic valve is also seen. The cardiac silhouette is mildly enlarged and unchanged in size. Mild diffuse, chronic appearing increased lung markings are noted. Mild superimposed areas of atelectasis and/or infiltrate are seen within the mid lung fields and bilateral lung bases. No pleural effusion or pneumothorax is identified. Multilevel degenerative changes are seen throughout the thoracic spine. IMPRESSION: Mild diffuse, chronic appearing increased lung markings with mild superimposed areas of mid lung field and bibasilar atelectasis and/or infiltrate. Electronically Signed   By: Aram Candela M.D.   On: 04/10/2023 18:58    Assessment and Plan:   Acute systolic heart failure New onset cardiomyopathy LVEF 35 to 40% Small LV apical thrombus on today's echo AKI on CKD Pneumonia Severe anemia Chronic cor pulmonale Aortic root repair and bioprosthetic AVR in 2014 CHB s/p dual-chamber PPM   -Presented with DOE, orthopnea and 30 pound weight gain. Echo today  showed new onset cardiomyopathy with LVEF 35 to 40% with RWMA (hypokinesis/akinesis of mid to distal inferoseptal wall and distal anteroseptal wall), small LV apical thrombus, normal RV size and function, mild MR, CVP 3 mmHg.  Diastology could not be evaluated due to paced rhythm.  BNP mildly elevated, 591.  Chest x-ray showed pneumonia and pulmonary edema.  Initially on IV Lasix  bolus doses with inadequate urine output. Switch to Lasix drip at 10 mg/h.  Serum creatinine uptrending, 2.58 today (baseline serum creatinine 1.5).  If serum creatinine continues to uptrend tomorrow on Lasix drip, she will need RHC.  Keep n.p.o. after midnight.  Start heparin drip for LV apical thrombus. She will benefit from [facility] to rule out coronary artery embolism from LV apical thrombus, once her AKI resolves.  Will hold irbesartan due to AKI. -Continue atorvastatin 40 mg nightly.   For questions or updates, please contact [facility] Please consult [email or web address] for contact info under Cardiology/STEMI.   Signed, Herbert Deaner, MD 04/11/2023 2:56 PM

## 2023-04-11 NOTE — Progress Notes (Signed)
Patient stated she was breathing easier and did not want to wear bipap. Patients Sp02=95% on 6lpm. Bipap in the room on standby

## 2023-04-11 NOTE — Plan of Care (Signed)
FMTS Brief Progress Note  S:Seen at bedside on evening rounds with Dr. Sharion Dove. Patient is in good spirits and tells [place] that she is feeling better today compared to yesterday. She is a bit nervous about undergoing cardiac catheterization but seems to understand that this is a necessary next step in her workup and management.  Supplemental O2 recently increased to 6L HFNC due to desat to 88% on 4L.  MAPs borderline. 60-63. Last BP logged was 103/27 (49) have asked for a STAT repeat, hope this is erroneous.   O: BP (!) 111/49 (BP Location: Left Arm)   Pulse 60   Temp 98.5 F (36.9 C) (Oral)   Resp 20   Ht 5' (1.524 m)   Wt 109.5 kg   SpO2 (!) 88% Comment: increased O2 to 6lpm  BMI 47.15 kg/m   Gen: Chronically ill appearing but NAD Cardio: RRR, no murmur Pulm: Normal WOB on 6L [place], speaking in full sentences, lungs clear anteriorly   A/P: Findings from today's workup include a newly reduced EF at 35-60% with regional wall motion abnormalities and a small LV apical thrombus. She has been seen by cardiology who has her on a Lasix gtt. They are recommending LHC +/- RHC pending her renal function. Irbesartan is being held.  Of note, around 1900 she had an increase in her O2 requirement from 4L HFNC to 6L HFNC. We will monitor this closely and if her respiratory status continues to decline, will need to place her back on BiPAP  Last Charted BP showed a diastolic pressure of 27. Have asked nursing staff to get a STAT repeat. If her pressures are falling will need to consider addition of inotropic support to help her with her diuresis.  - Orders reviewed. Labs for AM ordered, which was adjusted as needed.  - If condition changes, plan includes pausing Lasix gtt, discussing her case with cardiology, and considering addition of inotrope versus transfer to ICU for closer BP monitoring. Alicia Amel, MD 04/11/2023, 7:39 PM PGY-3, [facility] Night Resident  Please page  [phone] with questions.

## 2023-04-11 NOTE — Plan of Care (Signed)
Patient progressing on all care plans. Patient able to discontinue bipap and remains on O2 via [place] at 4-5L. Started on lasix and heparin gtt this shift. NPO at midnight.

## 2023-04-11 NOTE — Assessment & Plan Note (Addendum)
Creatinine worsened to 2.58, baseline ~1.5-1.7.  Suspect cardiorenal syndrome given profound volume overload with poor output, expect improvement with diuresis. -Hold Jardiance -Diuresis as above -Consult Nephro if continuing to worsen

## 2023-04-11 NOTE — Assessment & Plan Note (Addendum)
Suspect CHF is driving patient's current dyspnea, though underlying COPD likely contributing to limited respiratory reserve.  Lower suspicion of acute COPD exacerbation.  RPP negative. See no role for steroids/antibiotics at this moment.  -Continue BiPAP until stable, wean O2 as able with goal O2 88-92%, home baseline 4 L -DuoNebs every 4 hours PRN -Continue Dulera 2 puffs BID (on Symbicort formulary equivalent) -Continue guaifenesin 600 mg BID -Follow-up blood cultures

## 2023-04-11 NOTE — Hospital Course (Addendum)
Tonya Glass is a 77 y.o. year old with a history of HFpEF, HTN, sick sinus syndrome status post ICD, aortic stenosis status post valve replacement, CKD 3b, hypothyroidism, COPD, and T2DM who presented with acute hypoxic respiratory failure and was admitted to the [facility] Teaching service for acute on chronic HF exacerbation.  Acute on chronic CHF exacerbation Presented with dyspnea and weight gain thought to be in setting of acute CHF exacerbation with diffuse fluffy opacities and lung markings on CXR.  TTE showing EF 35-40%.  Diuresed with Lasix drip per cardiology.  Patient did start retaining CO2 as seen on VBG and was placed on BIPAP with improvement.  Creatinine rose sharply in the setting of overdiuresis and Lasix drip was stopped.  Cardiology performed right heart cath, which showed normal pressures and further diuresis was held.  Restarted on partial dose torsemide 60 mg twice daily with slight creatinine rise, but cleared to discharge with 1 week outpatient BMP per Nephrology.  AKI in setting of CKD3b Suspected in the setting of cardiorenal syndrome initially.  Improved with continued diuresis.  Then, creatinine rose of overdiuresis, and Lasix drip was stopped.  Farxiga and olmesartan were also held.  FENa supported prerenal etiology of AKI.  Nephrology was consulted and did not recommend additional intervention.  Renal ultrasound showed no hydronephrosis and no other pathology.  No urinary retention on bladder scans.  Creatinine gradually trended down towards baseline with PO hydration leading up to discharge, but continued holding Farxiga and olmesartan pending complete resolution of AKI outpatient.  LV Thrombus Found on TTE.  Placed on heparin drip.  Blood cultures x2 showed no growth.  Heparin drip discontinued prior to discharge. Patient was continued on their eliquis.  Anemia Thought to be due to chronic disease and iron deficiency. Hemoglobin remained stablely low between 7-9  while admitted, nephrology ordered ESAs.  Other chronic conditions were medically managed with home medications and formulary alternatives as necessary (COPD, T2DM, GERD, A flutter, hypothyroidism, depression, peripheral neuropathy, osteoporosis, HLD, HTN).  PCP Follow-up Recommendations: Look into sleep study for possible OSA/OHS, Cardiology recommended outpatient Consider GI follow up for potential colonoscopy 2/2 anemia, could also be chronic and depends on goals of care with patient Reduced torsemide to 60 mg BID in setting of AKI, please recheck BMP and increase back to 100 mg BID dose if appropriate Farxiga and olmesartan held at discharge in setting of AKI, restart if Creatinine trending down Stopped glipizide due to concerns for hypoglycemia in setting of worsened renal function

## 2023-04-11 NOTE — Assessment & Plan Note (Addendum)
Creatinine elevated to 2.43, baseline ~1.5-1.7.  Suspect cardiorenal syndrome given profound volume overload with poor output, expect improvement with diuresis. -Hold Jardiance -Diuresis as above

## 2023-04-11 NOTE — Progress Notes (Signed)
Daily Progress Note Intern Pager: [phone]  Patient name: Tonya Glass Medical record number: [id number] Date of birth: 08-22-1945 Age: 77 y.o. Gender: female  Primary Care Provider: No primary care provider on file. Consultants: Cardiology Code Status: DNR/DNI  Pt Overview and Major Events to Date:  -04/10/23 Admitted  Assessment and Plan:  Tonya Glass is 77 y.o. admitted for acute on chronic heart failure exacerbation. Pertinent PMH/PSH includes HFpEF, HLD, Aortic stenosis s/p valve replacement, Sick sinus syndrome, Hypothyroidism, HTN, CKD, COPD, T2DM.  Assessment & Plan Acute exacerbation of CHF (congestive heart failure) (HCC) Improved to 5L HFNC, continues to be volume overloaded on exam. ECHO pending. -Daily weights, strict I&Os -80mg  IV Lasix, 2.5mg  metolazone redose pending cardiology recommendations -Hold carvedilol given decompensated heart failure, restart as able -Follow-up TTE -Cardiology consult, recs appreciated  -PT OT to evaluate and treat Anemia Hgb stable on 7.4 from 7.9. Suspect combination of worsening anemia of chronic disease due to decline in renal function and hemodilution due to CHF. Iron labs within normal limits. -Continue home iron 325 mg daily -Hold home Eliquis 5 mg for now, restart if hemoglobin stable -Occult stool blood card pending -PM and AM CBC -Transfuse <7.0 Hgb given concern for volume overload COPD (chronic obstructive pulmonary disease) (HCC) Suspect CHF is driving patient's current dyspnea, though underlying COPD likely contributing to limited respiratory reserve. See no role for steroids/antibiotics at this moment.  -Continue BiPAP until stable, wean O2 as able with goal O2 88-92%, home baseline 4 L -DuoNebs every 4 hours PRN -Continue Dulera 2 puffs BID (on Symbicort formulary equivalent) -Continue guaifenesin 600 mg BID -Follow-up blood cultures DM2 (diabetes mellitus, type 2) (HCC) A1c 6.0. Chart review shows home meds  are 12 units Lantus nightly with sliding scale Humalog BID, glipizide 10 mg BID, and Jardiance 10 mg daily. -CBGs Q4h on BiPAP, then transition to ACHS and AM -Sensitive SSI -Dose long-acting insulin after trending CBGs -Hold home Jardiance 10 mg in setting of AKI -Hold home glipizide 10 mg BID -Follow-up A1c Acute kidney injury superimposed on stage 3b chronic kidney disease (HCC) Creatinine worsened to 2.58, baseline ~1.5-1.7.  Suspect cardiorenal syndrome given profound volume overload with poor output, expect improvement with diuresis. -Hold Jardiance -Diuresis as above -Consult Nephro if continuing to worsen GERD (gastroesophageal reflux disease) Doubt GI source of anemia. Monitor Hgb as above. -Continue home Carafate 1 g TID -Continue pantoprazole 40 mg PO BID Paroxysmal atrial flutter [facility]) Cardiologist Dr. Royann Shivers recommended holding Eliquis this morning outpatient given patient's hemoglobin drop.  Will evaluate prosthetic valve function on echo. -Hold Eliquis 5 mg BID for now given concern for acute drop in hemoglobin, restart if hemoglobin stable -AM BMP; goal K>4, mag>2   Chronic and Stable Issues: Hypothyroidism: Continue levothyroxine 75 mcg daily, check TSH Depression: Continue fluoxetine 40 mg daily, trazodone 50 mg nightly Peripheral neuropathy: Continue gabapentin 600 mg nightly Osteoporosis: On alendronate 70 mg every Sunday HLD: Continue home atorvastatin 40 mg daily HTN: Hold clonidine 0.2 mg nightly, irbesartan 150 mg daily (olmesartan 20 mg formulary equivalent), hold carvedilol 3.125 mg BID  FEN/GI: NPO on Bipap PPx: Holding Eliquis as above Dispo:Pending clinical improvement  Subjective:  No acute events since admission.  She is now doing better off of BiPAP and on nasal cannula.  Feels her breathing is much improved.  Feels that this is because she has been urinating often.  Objective: Temp:  [98.3 F (36.8 C)-99.4 F (37.4 C)] 99.1 F (37.3 C)  (  11/28 0343) Pulse Rate:  [62-113] 62 (11/28 0343) Resp:  [16-32] 20 (11/28 0343) BP: (97-140)/(27-108) 113/53 (11/28 0343) SpO2:  [94 %-100 %] 100 % (11/28 0343) FiO2 (%):  [40 %-60 %] 50 % (11/27 2159) Weight:  [108.4 kg-109.8 kg] 109.5 kg (11/28 0343) Physical Exam: General: NAD, sitting comfortably in hospital bed Neuro: A&O Cardiovascular: RRR, 3/6 murmur,2+ pitting edema to knee bilaterally Respiratory: normal WOB on 5 HFNC, bibasilar crackles Abdomen: soft, NTTP, no rebound or guarding Extremities: Moving all 4 extremities equally, warm well perfused   Laboratory: Most recent CBC Lab Results  Component Value Date   WBC 7.6 04/11/2023   HGB 7.4 (L) 04/11/2023   HCT 24.5 (L) 04/11/2023   MCV 98.4 04/11/2023   PLT 142 (L) 04/11/2023   Most recent BMP    Latest Ref Rng & Units 04/11/2023    6:29 AM  BMP  Glucose 70 - 99 mg/dL 914   BUN 8 - 23 mg/dL 34   Creatinine 7.82 - 1.00 mg/dL 9.56   Sodium 213 - 086 mmol/L 139   Potassium 3.5 - 5.1 mmol/L 4.2   Chloride 98 - 111 mmol/L 101   CO2 22 - 32 mmol/L 29   Calcium 8.9 - 10.3 mg/dL 8.1     Ferritin 74 Iron 30 TIBC 286 Mag 1.9 TSH 5.259 Hgb A1c 6.0  Imaging/Diagnostic Tests: ECHO 04/11/23 pending.   Celine Mans, MD 04/11/2023, 7:38 AM  PGY-2, [facility] FPTS Intern pager: [phone], text pages welcome Secure chat group [facility] Teaching Service

## 2023-04-11 NOTE — Assessment & Plan Note (Addendum)
Improved to 5L HFNC, continues to be volume overloaded on exam. ECHO pending. -Daily weights, strict I&Os -80mg  IV Lasix, 2.5mg  metolazone redose pending cardiology recommendations -Hold carvedilol given decompensated heart failure, restart as able -Follow-up TTE -Cardiology consult, recs appreciated  -PT OT to evaluate and treat

## 2023-04-11 NOTE — Assessment & Plan Note (Signed)
Cardiologist Dr. Royann Shivers recommended holding Eliquis this morning outpatient given patient's hemoglobin drop.  Will evaluate prosthetic valve function on echo. -Hold Eliquis 5 mg BID for now given concern for acute drop in hemoglobin, restart if hemoglobin stable -AM BMP; goal K>4, mag>2

## 2023-04-11 NOTE — Plan of Care (Signed)
  Problem: Education: Goal: Knowledge of General Education information will improve Description Including pain rating scale, medication(s)/side effects and non-pharmacologic comfort measures Outcome: Progressing   Problem: Health Behavior/Discharge Planning: Goal: Ability to manage health-related needs will improve Outcome: Progressing   

## 2023-04-11 NOTE — Assessment & Plan Note (Addendum)
Hgb has decreased from 11.6 and February 2024 to 7.9 this hospitalization.  Possible hemodilution in setting of poor diuresis and CHF exacerbation with significant volume retention, though would not likely cause such a significant drop.  Also considering GI bleed given mucosal pallor, though lower suspicion at this time.  If anemia is hemodilutional, expect to improve with diuresis.  If not improving, consider consulting GI.  No evidence of acute bleed at this time. -Continue home iron 325 mg daily, adding iron panel to am labs  -Hold home Eliquis 5 mg for now, restart if hemoglobin stable -Occult stool blood card pending -APTT and PT/INR -AM CBC, trend H&H

## 2023-04-11 NOTE — Assessment & Plan Note (Addendum)
Hgb stable on 7.4 from 7.9. Suspect combination of worsening anemia of chronic disease due to decline in renal function and hemodilution due to CHF. Iron labs within normal limits. -Continue home iron 325 mg daily -Hold home Eliquis 5 mg for now, restart if hemoglobin stable -Occult stool blood card pending -PM and AM CBC -Transfuse <7.0 Hgb given concern for volume overload

## 2023-04-11 NOTE — Assessment & Plan Note (Deleted)
Patient with history of aortic stenosis status post repair as well as sick sinus syndrome, now with ICD.  Cardiologist Dr. Royann Shivers recommended holding Eliquis this morning outpatient given patient's hemoglobin drop.  Will evaluate prosthetic valve function on echo. -Hold Eliquis 5 mg BID for now given concern for acute drop in hemoglobin, restart if hemoglobin stable -AM BMP; goal K>4, mag>2

## 2023-04-11 NOTE — Progress Notes (Signed)
PHARMACY - ANTICOAGULATION CONSULT NOTE  Pharmacy Consult for heparin Indication:  LV thrombus  No Known Allergies  Patient Measurements: Height: 5' (152.4 cm) Weight: 109.5 kg (241 lb 6.5 oz) IBW/kg (Calculated) : 45.5 Heparin Dosing Weight: 72kg  Vital Signs: Temp: 98.5 F (36.9 C) (11/28 1125) Temp Source: Oral (11/28 1125) BP: 108/39 (11/28 1125) Pulse Rate: 60 (11/28 1125)  Labs: Recent Labs    04/10/23 1837 04/11/23 0629  HGB 7.9* 7.4*  HCT 26.7* 24.5*  PLT 161 142*  APTT  --  38*  LABPROT  --  18.7*  INR  --  1.5*  CREATININE 2.43* 2.58*    Estimated Creatinine Clearance: 20.5 mL/min (A) (by C-G formula based on SCr of 2.58 mg/dL (H)).   Medical History: Past Medical History:  Diagnosis Date   Anxiety    Aortic valve stenosis 10/10/2016   Arthritis    "fingers" (07/24/2017)   CHF (congestive heart failure) (HCC)    COPD (chronic obstructive pulmonary disease) (HCC)    Depression    Gallstones    Heart murmur    High cholesterol    History of kidney stones    Hypertension    Hypothyroidism    Migraines    "none in years til 01/2013-02/2013; none since"  (07/24/2017)   Orthopnoea    "just since open heart surgery" (02/25/2013)   Pacemaker    [employer]    Pneumonia ~ 2012   "walking pneumonia"   Pneumonia 07/23/2017   Pneumonia 07/23/2017   Stroke (HCC) 2001   denies residual on 02/25/2013   Symptomatic hypotension 10/10/2016   Syncope and collapse    "prior to open heart surgery" (02/25/2013)   Type II diabetes mellitus (HCC)      Assessment: 28 yoF admitted with CHF and anemia. Pt with hx AF on apixaban PTA which was held for anemia ECHO today with LV thrombus, pharmacy consulted to dose IV heparin CBC low but stable, no obvious S/Sx bleeding noted. Will target lower goal for now and defer bolus.  Goal of Therapy:  Heparin level 0.3-0.5 units/ml aPTT 66-85 seconds Monitor platelets by anticoagulation protocol: Yes   Plan:   Heparin 1000 units/h no bolus Check aPTT in 8h Daily heparin level, aPTT, CBC  Fredonia Highland, PharmD, BCPS, [facility] Clinical Pharmacist [phone] Please check AMION for all [facility] numbers 04/11/2023

## 2023-04-11 NOTE — Assessment & Plan Note (Signed)
Suspect CHF is driving patient's current dyspnea, though underlying COPD likely contributing to limited respiratory reserve. See no role for steroids/antibiotics at this moment.  -Continue BiPAP until stable, wean O2 as able with goal O2 88-92%, home baseline 4 L -DuoNebs every 4 hours PRN -Continue Dulera 2 puffs BID (on Symbicort formulary equivalent) -Continue guaifenesin 600 mg BID -Follow-up blood cultures

## 2023-04-11 NOTE — Assessment & Plan Note (Signed)
Doubt GI source of anemia. Monitor Hgb as above. -Continue home Carafate 1 g TID -Continue pantoprazole 40 mg PO BID

## 2023-04-11 NOTE — Assessment & Plan Note (Signed)
Could not find mention of gastritis or GI follow-up in chart, though it is notable that patient is on Carafate.  Lower suspicion for GI bleed, but will manage anemia as per above and consult GI if necessary. -Continue home Carafate 1 g TID -Continue pantoprazole 40 mg PO BID

## 2023-04-11 NOTE — Progress Notes (Signed)
RT removed pt from BiPAP (V60) and placed pt on 5L [place]. Pt tolerating well at this time, no distress noted. RT left BiPAP on stby at bedside.

## 2023-04-11 NOTE — Assessment & Plan Note (Signed)
A1c 6.0. Chart review shows home meds are 12 units Lantus nightly with sliding scale Humalog BID, glipizide 10 mg BID, and Jardiance 10 mg daily. -CBGs Q4h on BiPAP, then transition to ACHS and AM -Sensitive SSI -Dose long-acting insulin after trending CBGs -Hold home Jardiance 10 mg in setting of AKI -Hold home glipizide 10 mg BID -Follow-up A1c

## 2023-04-12 ENCOUNTER — Inpatient Hospital Stay (HOSPITAL_COMMUNITY): Payer: 59

## 2023-04-12 DIAGNOSIS — I513 Intracardiac thrombosis, not elsewhere classified: Secondary | ICD-10-CM

## 2023-04-12 DIAGNOSIS — I5021 Acute systolic (congestive) heart failure: Secondary | ICD-10-CM | POA: Diagnosis not present

## 2023-04-12 HISTORY — DX: Intracardiac thrombosis, not elsewhere classified: I51.3

## 2023-04-12 LAB — CBC
HCT: 26.7 % — ABNORMAL LOW (ref 36.0–46.0)
Hemoglobin: 8 g/dL — ABNORMAL LOW (ref 12.0–15.0)
MCH: 29.1 pg (ref 26.0–34.0)
MCHC: 30 g/dL (ref 30.0–36.0)
MCV: 97.1 fL (ref 80.0–100.0)
Platelets: 168 10*3/uL (ref 150–400)
RBC: 2.75 MIL/uL — ABNORMAL LOW (ref 3.87–5.11)
RDW: 14.4 % (ref 11.5–15.5)
WBC: 11.8 10*3/uL — ABNORMAL HIGH (ref 4.0–10.5)
nRBC: 0 % (ref 0.0–0.2)

## 2023-04-12 LAB — BASIC METABOLIC PANEL
Anion gap: 11 (ref 5–15)
BUN: 32 mg/dL — ABNORMAL HIGH (ref 8–23)
CO2: 32 mmol/L (ref 22–32)
Calcium: 8.8 mg/dL — ABNORMAL LOW (ref 8.9–10.3)
Chloride: 99 mmol/L (ref 98–111)
Creatinine, Ser: 2.38 mg/dL — ABNORMAL HIGH (ref 0.44–1.00)
GFR, Estimated: 20 mL/min — ABNORMAL LOW (ref >60)
Glucose, Bld: 187 mg/dL — ABNORMAL HIGH (ref 70–99)
Potassium: 3.9 mmol/L (ref 3.5–5.1)
Sodium: 142 mmol/L (ref 135–145)

## 2023-04-12 LAB — GLUCOSE, CAPILLARY
Glucose-Capillary: 148 mg/dL — ABNORMAL HIGH (ref 70–99)
Glucose-Capillary: 155 mg/dL — ABNORMAL HIGH (ref 70–99)
Glucose-Capillary: 158 mg/dL — ABNORMAL HIGH (ref 70–99)
Glucose-Capillary: 164 mg/dL — ABNORMAL HIGH (ref 70–99)
Glucose-Capillary: 168 mg/dL — ABNORMAL HIGH (ref 70–99)
Glucose-Capillary: 172 mg/dL — ABNORMAL HIGH (ref 70–99)

## 2023-04-12 LAB — APTT
aPTT: 64 s — ABNORMAL HIGH (ref 24–36)
aPTT: 88 s — ABNORMAL HIGH (ref 24–36)

## 2023-04-12 LAB — HEPARIN LEVEL (UNFRACTIONATED): Heparin Unfractionated: >1.1 [IU]/mL — ABNORMAL HIGH (ref 0.30–0.70)

## 2023-04-12 LAB — MAGNESIUM: Magnesium: 2 mg/dL (ref 1.7–2.4)

## 2023-04-12 MED ORDER — POTASSIUM CHLORIDE 20 MEQ PO PACK
40.0000 meq | PACK | Freq: Once | ORAL | Status: AC
  Administered 2023-04-12: 40 meq via ORAL
  Filled 2023-04-12: qty 2

## 2023-04-12 NOTE — Progress Notes (Signed)
PT Cancellation Note  Patient Details Name: Shashanna Kreitzer MRN: [id number] DOB: 09-25-1945   Cancelled Treatment:    Reason Eval/Treat Not Completed: Medical issues which prohibited therapy per pharmacy note, aPTT is still sub-therapeutic in the context of LV thrombus found on ECHO. Will hold for now, please reach out via epic secure messenger if she has urgent acute PT needs.   Nedra Hai, PT, DPT 04/12/23 8:39 AM

## 2023-04-12 NOTE — Assessment & Plan Note (Addendum)
Hgb stable at 8. Suspect combination of worsening anemia of chronic disease due to decline in renal function and hemodilution due to CHF.  -Continue home iron 325 mg daily -Hold home Eliquis 5 mg for now, currently on heparin drip -Occult stool blood card pending - AM CBC -Transfuse <7.0 Hgb given concern for volume overload

## 2023-04-12 NOTE — Assessment & Plan Note (Addendum)
Breathing comfortably on 5L [place] (uses 4 L at home sometimes). ECHO 11/28 with EF 35-40% and LV regional wall motion abnormalities, and small LV thrombus.  Currently on Lasix drip per cardiology. S/p metolazone yesterday. 24 hr UOP 1.8 L. Did fever to 100.5 this am which quickly resolved and WBC 10.5>11.8. Some concern for possibly PNA contributing to dyspnea though respiratory status has improved significantly without abx. - monitor fever curve, if fever persists, consider abx to cover CAP.  - repeat CXR today -Daily weights, strict I&Os -Cardiology following:  Lasix drip, considering cath -Hold carvedilol given decompensated heart failure, restart as able -Cardiology consult, recs appreciated  -PT OT to evaluate and treat

## 2023-04-12 NOTE — Assessment & Plan Note (Signed)
 Doubt GI source of anemia. Monitor Hgb as above. -Continue home Carafate 1 g TID -Continue pantoprazole 40 mg PO BID

## 2023-04-12 NOTE — Progress Notes (Signed)
Progress Note  Patient Name: Tonya Glass Date of Encounter: 04/12/2023  Primary Cardiologist: Chrystie Nose, MD   Subjective   Patient seen examined at her bedside.  She was awake when I arrived.  Still short of breath something that is improving.  Tolerating the Lasix drip.  Inpatient Medications    Scheduled Meds:  atorvastatin  40 mg Oral Daily   ferrous sulfate  325 mg Oral Q1200   FLUoxetine  40 mg Oral Daily   gabapentin  600 mg Oral QHS   guaiFENesin  600 mg Oral BID   insulin aspart  0-9 Units Subcutaneous TID WC   levothyroxine  75 mcg Oral Daily   mometasone-formoterol  2 puff Inhalation BID   multivitamin with minerals  1 tablet Oral Daily   pantoprazole  40 mg Oral BID   sodium chloride flush  3 mL Intravenous Q12H   sucralfate  1 g Oral TID WC & HS   traZODone  50 mg Oral QHS   Continuous Infusions:  furosemide (LASIX) 200 mg in dextrose 5 % 100 mL (2 mg/mL) infusion 10 mg/hr (04/12/23 0858)   heparin 1,100 Units/hr (04/12/23 0116)   PRN Meds: acetaminophen **OR** acetaminophen, ipratropium-albuterol, sodium chloride flush   Vital Signs    Vitals:   04/12/23 0416 04/12/23 0514 04/12/23 0751 04/12/23 0753  BP: (!) 127/34 (!) 141/51 107/61 107/61  Pulse: 99  93   Resp: 18  18   Temp: 98.7 F (37.1 C)  (!) 100.5 F (38.1 C)   TempSrc: Oral  Oral   SpO2: 98%  100%   Weight: 104.3 kg     Height:        Intake/Output Summary (Last 24 hours) at 04/12/2023 0943 Last data filed at 04/12/2023 6578 Gross per 24 hour  Intake 43.12 ml  Output 2800 ml  Net -2756.88 ml   Filed Weights   04/10/23 2219 04/11/23 0343 04/12/23 0416  Weight: 109.8 kg 109.5 kg 104.3 kg    Telemetry     V-paced rhythm- Personally Reviewed  ECG    On today- Personally Reviewed  Physical Exam    General: Comfortable Head: Atraumatic, normal size  Eyes: PEERLA, EOMI  Neck: Supple, normal JVD Cardiac: Normal S1, S2; RRR; no murmurs, rubs, or gallops Lungs:  Clear to auscultation bilaterally Abd: Soft, nontender, no hepatomegaly  Ext: warm, +3 bilateral lower extremity edema Musculoskeletal: No deformities, BUE and BLE strength normal and equal Skin: Warm and dry, no rashes   Neuro: Alert and oriented to person, place, time, and situation, CNII-XII grossly intact, no focal deficits  Psych: Normal mood and affect   Labs    Chemistry Recent Labs  Lab 04/10/23 1837 04/11/23 0629 04/12/23 0308  NA 140 139 142  K 3.7 4.2 3.9  CL 101 101 99  CO2 31 29 32  GLUCOSE 115* 146* 187*  BUN 33* 34* 32*  CREATININE 2.43* 2.58* 2.38*  CALCIUM 8.7* 8.1* 8.8*  GFRNONAA 20* 19* 20*  ANIONGAP 8 9 11      Hematology Recent Labs  Lab 04/11/23 0629 04/11/23 1606 04/12/23 0308  WBC 7.6 10.5 11.8*  RBC 2.49* 2.70* 2.75*  HGB 7.4* 8.0* 8.0*  HCT 24.5* 25.5* 26.7*  MCV 98.4 94.4 97.1  MCH 29.7 29.6 29.1  MCHC 30.2 31.4 30.0  RDW 14.6 14.6 14.4  PLT 142* 138* 168    Cardiac EnzymesNo results for input(s): "TROPONINI" in the last 168 hours. No results for input(s): "TROPIPOC" in the  last 168 hours.   BNP Recent Labs  Lab 04/10/23 1837  BNP 591.4*     DDimer No results for input(s): "DDIMER" in the last 168 hours.   Radiology    ECHOCARDIOGRAM COMPLETE  Result Date: 04/11/2023    ECHOCARDIOGRAM REPORT   Patient Name:   Tonya Glass Date of Exam: 04/11/2023 Medical Rec #:  [id number]      Height:       60.0 in Accession #:    [id number]     Weight:       241.4 lb Date of Birth:  07/09/1945      BSA:          2.022 m Patient Age:    77 years       BP:           113/53 mmHg Patient Gender: F              HR:           60 bpm. Exam Location:  Inpatient Procedure: 2D Echo, Cardiac Doppler, Color Doppler and Intracardiac            Opacification Agent Indications:     Congestive Heart Failure I50.9  History:         Patient has prior history of Echocardiogram examinations, most                  recent 06/13/2021. CHF, COPD and Stroke, Aortic  Valve Disease;                  Risk Factors:Diabetes and Hypertension.  Sonographer:     Darlys Gales Referring Phys:  [id number] SARA L NEAL Diagnosing Phys: Dorthula Nettles IMPRESSIONS  1. Left ventricular ejection fraction, by estimation, is 35 to 40%. The left ventricle has moderately decreased function. The left ventricle demonstrates regional wall motion abnormalities with hypokinesis of the mid to distal inferoseptal wall and distal anteroseptal wall; however, complete wall motion assessment limited by poor visualization of endocardial borders and off axis angles. There is a small LV apical thrombus.  2. Right ventricular systolic function is normal. The right ventricular size is normal.  3. Left atrial size was mildly dilated.  4. The mitral valve is normal in structure. Mild mitral valve regurgitation. No evidence of mitral stenosis.  5. Well seated bioprosthetic aortic valve with no paravalvular regurgitation. Aortic valve mean gradient measures 19.0 mmHg.  6. The inferior vena cava is normal in size with greater than 50% respiratory variability, suggesting right atrial pressure of 3 mmHg. FINDINGS  Left Ventricle: Left ventricular ejection fraction, by estimation, is 35 to 40%. The left ventricle has moderately decreased function. The left ventricle demonstrates regional wall motion abnormalities. Definity contrast agent was given IV to delineate the left ventricular endocardial borders. The left ventricular internal cavity size was normal in size. There is no left ventricular hypertrophy. Left ventricular diastolic function could not be evaluated due to paced rhythm. Left ventricular diastolic function could not be evaluated.  LV Wall Scoring: The mid and distal anterior septum, mid inferoseptal segment, apical anterior segment, apical inferior segment, and apex are hypokinetic. Right Ventricle: The right ventricular size is normal. No increase in right ventricular wall thickness. Right ventricular systolic  function is normal. Left Atrium: Left atrial size was mildly dilated. Right Atrium: Right atrial size was normal in size. Pericardium: There is no evidence of pericardial effusion. Mitral Valve: The mitral valve is normal in structure. Mild mitral annular calcification.  Mild mitral valve regurgitation. No evidence of mitral valve stenosis. Tricuspid Valve: The tricuspid valve is normal in structure. Tricuspid valve regurgitation is not demonstrated. No evidence of tricuspid stenosis. Aortic Valve: The aortic valve has been repaired/replaced. Aortic valve regurgitation is mild. Mild aortic stenosis is present. Aortic valve mean gradient measures 19.0 mmHg. Aortic valve peak gradient measures 32.7 mmHg. Aortic valve area, by VTI measures 1.95 cm. There is a bioprosthetic valve present in the aortic position. Pulmonic Valve: The pulmonic valve was normal in structure. Pulmonic valve regurgitation is not visualized. No evidence of pulmonic stenosis. Aorta: The aortic root is normal in size and structure. Venous: The inferior vena cava is normal in size with greater than 50% respiratory variability, suggesting right atrial pressure of 3 mmHg. IAS/Shunts: No atrial level shunt detected by color flow Doppler.  LEFT VENTRICLE PLAX 2D LVIDd:         4.80 cm   Diastology LVIDs:         3.30 cm   LV e' medial:    5.98 cm/s LV PW:         1.00 cm   LV E/e' medial:  19.9 LV IVS:        1.20 cm   LV e' lateral:   7.29 cm/s LVOT diam:     1.90 cm   LV E/e' lateral: 16.3 LV SV:         134 LV SV Index:   66 LVOT Area:     2.84 cm  LEFT ATRIUM             Index LA Vol (A2C):   72.2 ml 35.71 ml/m LA Vol (A4C):   66.9 ml 33.09 ml/m LA Biplane Vol: 72.0 ml 35.61 ml/m  AORTIC VALVE AV Area (Vmax):    1.72 cm AV Area (Vmean):   1.92 cm AV Area (VTI):     1.95 cm AV Vmax:           286.00 cm/s AV Vmean:          207.000 cm/s AV VTI:            0.688 m AV Peak Grad:      32.7 mmHg AV Mean Grad:      19.0 mmHg LVOT Vmax:          173.00 cm/s LVOT Vmean:        140.000 cm/s LVOT VTI:          0.473 m LVOT/AV VTI ratio: 0.69 MITRAL VALVE                TRICUSPID VALVE MV Area (PHT): 4.63 cm     TR Peak grad:   16.8 mmHg MV Decel Time: 164 msec     TR Vmax:        205.00 cm/s MV E velocity: 119.00 cm/s MV A velocity: 47.70 cm/s   SHUNTS MV E/A ratio:  2.49         Systemic VTI:  0.47 m                             Systemic Diam: 1.90 cm Aditya Sabharwal Electronically signed by Dorthula Nettles Signature Date/Time: 04/11/2023/1:20:03 PM    Final (Updated)    DG Chest Portable 1 View  Result Date: 04/10/2023 CLINICAL DATA:  Shortness of breath. EXAM: PORTABLE CHEST 1 VIEW COMPARISON:  June 21, 2022 FINDINGS: There is stable dual lead AICD  positioning. Multiple sternal wires are present. An artificial aortic valve is also seen. The cardiac silhouette is mildly enlarged and unchanged in size. Mild diffuse, chronic appearing increased lung markings are noted. Mild superimposed areas of atelectasis and/or infiltrate are seen within the mid lung fields and bilateral lung bases. No pleural effusion or pneumothorax is identified. Multilevel degenerative changes are seen throughout the thoracic spine. IMPRESSION: Mild diffuse, chronic appearing increased lung markings with mild superimposed areas of mid lung field and bibasilar atelectasis and/or infiltrate. Electronically Signed   By: Aram Candela M.D.   On: 04/10/2023 18:58    Cardiac Studies   Review echocardiogram  Patient Profile     77 y.o. female 77 year old woman with new onset cardiomyopathy recent EF 35 to 40%, noted small LV apical thrombus on her echocardiogram.  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  Net negative today -2758 mL.  Creatinine is also improving 2.38 today.  Likely AKI was prerenal azotemia.  In terms of her new onset cardiomyopathy with wall motion abnormalities she definitely would benefit from cardiac catheterization here to rule out ischemic etiology at which time it will be beneficial to do a right and left heart catheterization.  But with her kidney function that at baseline and she still need to be diuresed it would be better to hold off till probably early next week.  I do agree with heparin drip for her noted left ventricular apical thrombus.  Which puts She can be transition to oral anticoagulant.  Again once we are able to fully diurese with improving kidney function we can start her guideline directed medical therapy which would include beta-blocker, ARB/ARNI, Aldactone and SGLT inhibitors. Noted history of aortic valve replacement with aortic valve appears to be well-seated.  Her anemia could be multifactorial in the setting of her heart failure as well as anemia chronic disease.  For questions or updates, please contact [facility] Please consult [email or web address] for contact info under Cardiology/STEMI.      SignedThomasene Ripple, DO  04/12/2023, 9:43 AM

## 2023-04-12 NOTE — TOC Initial Note (Signed)
Transition of Care [facility]) - Initial/Assessment Note    Patient Details  Name: Tonya Glass MRN: [id number] Date of Birth: 18-Sep-1945  [facility]) CM/SW Contact:    Michaela Corner, LCSWA Phone Number: 04/12/2023, 3:31 PM  Clinical Narrative:  Pt reports she is from [facility]. She has a PCP and pharmacy that she uses at her ALF. At time of transport pt reports she can have her dtr get her unless she needs ambulance transport. No DME was reported.             Barriers to Discharge: Continued Medical Work up   Patient Goals and CMS Choice Patient states their goals for this hospitalization and ongoing recovery are:: To return to ALF          Expected Discharge Plan and Services       Living arrangements for the past 2 months: Assisted Living Facility                                      Prior Living Arrangements/Services Living arrangements for the past 2 months: Assisted Living Facility   Patient language and need for interpreter reviewed:: Yes        Need for Family Participation in Patient Care: No (Comment) Care giver support system in place?: No (comment)   Criminal Activity/Legal Involvement Pertinent to Current Situation/Hospitalization: No - Comment as needed  Activities of Daily Living      Permission Sought/Granted                  Emotional Assessment Appearance:: Appears stated age Attitude/Demeanor/Rapport: Engaged Affect (typically observed): Stable, Calm Orientation: : Oriented to Place, Oriented to Self, Oriented to  Time, Oriented to Situation Alcohol / Substance Use: Not Applicable Psych Involvement: No (comment)  Admission diagnosis:  SOB (shortness of breath) [R06.02] Acute exacerbation of CHF (congestive heart failure) (HCC) [I50.9] Patient Active Problem List   Diagnosis Date Noted   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   Acute kidney injury superimposed on stage 3b chronic kidney disease (HCC) 04/11/2023    Anemia 04/11/2023   COPD (chronic obstructive pulmonary disease) (HCC) 04/11/2023   GERD (gastroesophageal reflux disease) 04/11/2023   Acute exacerbation of CHF (congestive heart failure) (HCC) 04/10/2023   Pressure injury of skin 06/23/2022   Cor pulmonale, chronic (HCC) 09/12/2021   PAH (pulmonary artery hypertension) (HCC) 09/12/2021   Peripheral venous insufficiency 09/12/2021   Stage 3b chronic kidney disease (HCC) 09/12/2021   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   Paravalvular leak (prosthetic valve) 08/07/2021   Nonrheumatic mitral valve regurgitation    Venous stasis ulcers of both lower extremities (HCC) 04/13/2021   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   Paroxysmal atrial flutter (HCC) 04/13/2021   Thoracic aortic aneurysm without rupture (HCC) 03/19/2018   Nonrheumatic aortic (valve) insufficiency 03/19/2018   Acute CHF (congestive heart failure) (HCC) 08/18/2017   Acute diastolic CHF (congestive heart failure) (HCC) 08/18/2017   CHF (congestive heart failure) (HCC) 08/18/2017   Hypothyroidism 07/23/2017   Acute respiratory failure with hypoxia (HCC) 07/23/2017   Aortic valve stenosis 10/10/2016   Diastolic murmur 08/02/2016   SSS (sick sinus syndrome) (HCC) 03/20/2016   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   Atherosclerosis of native coronary artery of native heart without angina pectoris 09/06/2014   Pacemaker 05/28/2013   Essential hypertension 05/28/2013  Dyslipidemia 05/28/2013   S/P aortic valve replacement with bioprosthetic valve 05/28/2013   DM2 (diabetes mellitus, type 2) (HCC) 05/28/2013   Chronic heart failure (HCC) 05/28/2013   PCP:  No primary care provider on file. Pharmacy:   [facility] - [place], [place] - [address] [place] [address] Phone: [phone] Fax: [phone]     Social Determinants of Health  (SDOH) Social History: SDOH Screenings   Food Insecurity: No Food Insecurity (04/11/2023)  Housing: Low Risk  (04/11/2023)  Transportation Needs: No Transportation Needs (04/11/2023)  Utilities: Not At Risk (04/11/2023)  Social Connections: Unknown (09/25/2021)   Received from [facility]  Tobacco Use: Medium Risk (04/10/2023)   SDOH Interventions:     Readmission Risk Interventions     No data to display

## 2023-04-12 NOTE — Evaluation (Signed)
Occupational Therapy Evaluation Patient Details Name: Tonya Glass MRN: [id number] DOB: [birth year] Today's Date: 04/12/2023   History of Present Illness Tonya Glass is 77 y.o. admitted for acute on chronic heart failure exacerbation. Small LV thrombus noted with AKI and anemia.   Pertinent PMH/PSH includes HFpEF, HLD, Aortic stenosis s/p valve replacement, Sick sinus syndrome, Hypothyroidism, HTN, CKD, COPD, T2DM.   Clinical Impression   Pt currently limited with selfcare, toileting, and transfers secondary to BLE pain.  Unable to complete scoot pivot transfer or standing from EOB secondary to inability to tolerate pain in her feet with attempted weightbearing.  Oxygen sats at 97-100% on 5Ls nasal cannula.  Prior to admission she was residing a [facility] where she was able to transfer without assist from her lift chair to the bathroom or tasks in her room but relied on staff to help with transport outside of the room via wheelchair.  She needed some assist with bathing and with LB dressing tasks.  Feel she will benefit from acute care OT to help progress ADL independence with recommendation for continued OT via inpatient follow up therapy, <3 hours/day.          If plan is discharge home, recommend the following: Two people to help with walking and/or transfers;Two people to help with bathing/dressing/bathroom;Assist for transportation    Functional Status Assessment  Patient has had a recent decline in their functional status and demonstrates the ability to make significant improvements in function in a reasonable and predictable amount of time.  Equipment Recommendations  Other (comment) (TBD next venue of care)       Precautions / Restrictions Precautions Precautions: Fall Restrictions Weight Bearing Restrictions: No      Mobility Bed Mobility Overal bed mobility: Needs Assistance Bed Mobility: Supine to Sit, Sit to Supine, Rolling Rolling: Min assist, Used rails    Supine to sit: Min assist, HOB elevated, Used rails Sit to supine: Mod assist   General bed mobility comments: Pt needed mod assist to scoot her hips to the EOB once sitting and then max assist for bringing LEs up and straightening out her trunk in the bed.    Transfers                   General transfer comment: Pt unable this session secondary to weakness and BLE pain.      Balance Overall balance assessment: Needs assistance Sitting-balance support: Feet supported Sitting balance-Leahy Scale: Fair         Standing balance comment: unable to attempt this session                           ADL either performed or assessed with clinical judgement   ADL Overall ADL's : Needs assistance/impaired Eating/Feeding: Independent;Sitting   Grooming: Wash/dry hands;Wash/dry face;Sitting;Set up   Upper Body Bathing: Supervision/ safety;Sitting   Lower Body Bathing: Moderate assistance                         General ADL Comments: Pt unable to stand or scoot up EOB once sitting secondary to increased back pain and LE pain when therapist would attempt to help her scoot.  She needed mod assist for supine to sit EOB and then total assist +2 to help scoot up in the bed after laying down.  Oxygen sats at 97% or better on 5 Ls nasal cannula.     Vision Baseline Vision/History:  0 No visual deficits Ability to See in Adequate Light: 0 Adequate Patient Visual Report: No change from baseline Vision Assessment?: No apparent visual deficits     Perception Perception: Within Functional Limits       Praxis Praxis: WFL       Pertinent Vitals/Pain Pain Assessment Pain Assessment: Faces Faces Pain Scale: Hurts little more Pain Location: BLEs Pain Descriptors / Indicators: Discomfort Pain Intervention(s): Limited activity within patient's tolerance, Repositioned     Extremity/Trunk Assessment Upper Extremity Assessment Upper Extremity Assessment:  Generalized weakness (shoulder strength 3+/5 all other joints 4/5 grossly)   Lower Extremity Assessment Lower Extremity Assessment: Defer to PT evaluation   Cervical / Trunk Assessment Cervical / Trunk Assessment: Normal   Communication Communication Communication: No apparent difficulties   Cognition Arousal: Alert Behavior During Therapy: WFL for tasks assessed/performed Overall Cognitive Status: Within Functional Limits for tasks assessed                                                  Home Living Family/patient expects to be discharged to:: Skilled nursing facility [facility])   Available Help at Discharge: Available 24 hours/day               Bathroom Shower/Tub: Producer, television/film/video: Standard     Home Equipment: Rollator (4 wheels);Shower seat;Grab bars - toilet;Lift chair   Additional Comments: Uses lift chair for sleeping and relaxing.  Elevates it for sit to stand.      Prior Functioning/Environment               Mobility Comments: Pt has rollator but would furniture walk in her room.  Does not walk outside her room, they transport her with the wheelchair. ADLs Comments: Had assist with showering but was able to dress herself.        OT Problem List: Impaired balance (sitting and/or standing);Pain;Decreased activity tolerance;Decreased knowledge of use of DME or AE;Decreased strength;Cardiopulmonary status limiting activity      OT Treatment/Interventions: Self-care/ADL training;Therapeutic activities;Balance training;DME and/or AE instruction;Patient/family education;Energy conservation;Neuromuscular education;Therapeutic exercise    OT Goals(Current goals can be found in the care plan section) Acute Rehab OT Goals Patient Stated Goal: Pt wants to go back to [facility] as soon as possible OT Goal Formulation: With patient Time For Goal Achievement: 04/26/23 Potential to Achieve Goals: Good  OT Frequency: Min  1X/week       AM-PAC OT "6 Clicks" Daily Activity     Outcome Measure Help from another person eating meals?: None Help from another person taking care of personal grooming?: A Little Help from another person toileting, which includes using toliet, bedpan, or urinal?: Total Help from another person bathing (including washing, rinsing, drying)?: Total Help from another person to put on and taking off regular upper body clothing?: A Little Help from another person to put on and taking off regular lower body clothing?: Total 6 Click Score: 13   End of Session Nurse Communication: Mobility status  Activity Tolerance: Patient limited by pain Patient left: in bed;with call bell/phone within reach  OT Visit Diagnosis: Unsteadiness on feet (R26.81);Other abnormalities of gait and mobility (R26.89);Pain Pain - Right/Left: Right (BLEs) Pain - part of body: Leg                Time: 1343-1410 OT Time Calculation (min): 27  min Charges:  OT General Charges $OT Visit: 1 Visit OT Evaluation $OT Eval Moderate Complexity: 1 Mod OT Treatments $Self Care/Home Management : 8-22 mins  Perrin Maltese, OTR/L Acute Rehabilitation Services  Office [phone] 04/12/2023

## 2023-04-12 NOTE — Assessment & Plan Note (Addendum)
Cardiologist Dr. Royann Shivers recommended holding Eliquis yesterday given patient's hemoglobin drop.   -Hold Eliquis 5 mg BID, currently on heparin drip. -AM BMP; goal K>4, mag>2

## 2023-04-12 NOTE — Plan of Care (Signed)
  Problem: Education: Goal: Knowledge of General Education information will improve Description Including pain rating scale, medication(s)/side effects and non-pharmacologic comfort measures Outcome: Progressing   Problem: Health Behavior/Discharge Planning: Goal: Ability to manage health-related needs will improve Outcome: Progressing   

## 2023-04-12 NOTE — Consult Note (Signed)
WOC Nurse Consult Note: Reason for Consult: Consult requested for bilat legs.  She wears compression wraps at a SNF prior to admission. Left leg without wounds or weeping.  Small amt edema. Right leg with full thickness wounds to outer calf 2X2 and 3X3X.2cm X.2cm with small amt bloody drainage.  Dressing procedure/placement/frequency: Topical treatment orders provided for bedside nurses to perform as follows: Leave compression wraps on and change Q FRI: apply alginate to right leg wounds [facility] [place] # [phone]), then ABD pad and kerlex and ace wrap, beginning just behind toes to below knees.   Apply kerlex and ace wrap, beginning just behind toes to below knees to left leg Q Fri. Please re-consult if further assistance is needed.  Thank-you,  Cammie Mcgee MSN, RN, CWOCN, [place], CNS [phone]

## 2023-04-12 NOTE — Progress Notes (Addendum)
Daily Progress Note Intern Pager: [phone]  Patient name: Tonya Glass Medical record number: [id number] Date of birth: 06/02/1945 Age: 77 y.o. Gender: female  Primary Care Provider: No primary care provider on file. Consultants: Cardiology Code Status: DNR/DNI  Pt Overview and Major Events to Date:  04/10/2023-admitted  Assessment and Plan: Tonya Glass is 77 y.o. admitted for acute on chronic heart failure exacerbation. Pertinent PMH/PSH includes HFpEF, HLD, Aortic stenosis s/p valve replacement, Sick sinus syndrome, Hypothyroidism, HTN, CKD, COPD, T2DM.  Assessment & Plan Acute exacerbation of CHF (congestive heart failure) (HCC) Breathing comfortably on 5L [place] (uses 4 L at home sometimes). ECHO 11/28 with EF 35-40% and LV regional wall motion abnormalities, and small LV thrombus.  Currently on Lasix drip per cardiology. S/p metolazone yesterday. 24 hr UOP 1.8 L. Did fever to 100.5 this am which quickly resolved and WBC 10.5>11.8. Some concern for possibly PNA contributing to dyspnea though respiratory status has improved significantly without abx. - monitor fever curve, if fever persists, consider abx to cover CAP.  - repeat CXR today -Daily weights, strict I&Os -Cardiology following:  Lasix drip, considering cath -Hold carvedilol given decompensated heart failure, restart as able -Cardiology consult, recs appreciated  -PT OT to evaluate and treat LV thrombus Seen on echo yesterday.  Blood cx Ng2d but did fever this a.m.  - monitor fever curve, considering broad spec abx to cover possible endocarditis  - repeat blood cultures  -Continue heparin drip -Cardiology considering LHC to evaluate for coronary artery embolism Acute kidney injury superimposed on stage 3b chronic kidney disease (HCC) Cr 2.58 >2.38, baseline ~1.5-1.7.  Suspect cardiorenal syndrome given profound volume overload with poor output, expect improvement with diuresis. -Hold Jardiance and  irbesartan -Diuresis as above -Consult Nephro if continuing to worsen -am bmp Anemia Hgb stable at 8. Suspect combination of worsening anemia of chronic disease due to decline in renal function and hemodilution due to CHF.  -Continue home iron 325 mg daily -Hold home Eliquis 5 mg for now, currently on heparin drip -Occult stool blood card pending - AM CBC -Transfuse <7.0 Hgb given concern for volume overload COPD (chronic obstructive pulmonary disease) (HCC) Suspect CHF is driving patient's current dyspnea.  She is doing better from a respiratory standpoint, no longer on high flow and has not used as needed DuoNebs.  - wean O2 as able with goal O2 88-92%, home baseline 4 L -DuoNebs every 4 hours PRN -Continue Dulera 2 puffs BID (on Symbicort formulary equivalent) -Continue guaifenesin 600 mg BID -Follow-up blood cultures DM2 (diabetes mellitus, type 2) (HCC) A1c 6.0. Chart review shows home meds are 12 units Lantus nightly with sliding scale Humalog BID, glipizide 10 mg BID, and Jardiance 10 mg daily. AM glucose 164 -Sensitive SSI -Dose long-acting insulin if needed -Hold home Jardiance 10 mg in setting of AKI -Hold home glipizide 10 mg BID -Follow-up A1c GERD (gastroesophageal reflux disease) Doubt GI source of anemia. Monitor Hgb as above. -Continue home Carafate 1 g TID -Continue pantoprazole 40 mg PO BID Paroxysmal atrial flutter (HCC) Cardiologist Dr. Royann Shivers recommended holding Eliquis yesterday given patient's hemoglobin drop.   -Hold Eliquis 5 mg BID, currently on heparin drip. -AM BMP; goal K>4, mag>2   Chronic and Stable Issues: Hypothyroidism: Continue levothyroxine 75 mcg daily, check TSH Depression: Continue fluoxetine 40 mg daily, trazodone 50 mg nightly Peripheral neuropathy: Continue gabapentin 600 mg nightly Osteoporosis: On alendronate 70 mg every Sunday HLD: Continue home atorvastatin 40 mg daily HTN: Hold clonidine  0.2 mg nightly, irbesartan 150 mg daily  (olmesartan 20 mg formulary equivalent), hold carvedilol 3.125 mg BID Full-thickness wounds to right leg-wound care following, compression wraps to both legs  FEN/GI: N.p.o. PPx: Currently on heparin drip Dispo: Pending continued medical management  Subjective:  Patient states she is doing very well this morning, feeling much better than when she was admitted.  Objective: Temp:  [98.5 F (36.9 C)-99.3 F (37.4 C)] 98.7 F (37.1 C) (11/29 0416) Pulse Rate:  [59-99] 99 (11/29 0416) Resp:  [14-20] 18 (11/29 0416) BP: (103-141)/(27-85) 141/51 (11/29 0514) SpO2:  [88 %-100 %] 98 % (11/29 0416) Weight:  [104.3 kg] 104.3 kg (11/29 0416) Physical Exam: General: 77 year old female, sitting up in bed, getting blood drawn Cardiovascular: RRR Respiratory: CTAB anteriorly, breathing comfortably on 5 L [place] Extremities: Ace wrap in place bilaterally  Laboratory: Most recent CBC Lab Results  Component Value Date   WBC 11.8 (H) 04/12/2023   HGB 8.0 (L) 04/12/2023   HCT 26.7 (L) 04/12/2023   MCV 97.1 04/12/2023   PLT 168 04/12/2023   Most recent BMP    Latest Ref Rng & Units 04/12/2023    3:08 AM  BMP  Glucose 70 - 99 mg/dL 161   BUN 8 - 23 mg/dL 32   Creatinine 0.96 - 1.00 mg/dL 0.45   Sodium 409 - 811 mmol/L 142   Potassium 3.5 - 5.1 mmol/L 3.9   Chloride 98 - 111 mmol/L 99   CO2 22 - 32 mmol/L 32   Calcium 8.9 - 10.3 mg/dL 8.8      Erick Alley, DO 04/12/2023, 7:50 AM  PGY-3, [facility] FPTS Intern pager: [phone], text pages welcome Secure chat group [facility] Teaching Service

## 2023-04-12 NOTE — Progress Notes (Signed)
PHARMACY - ANTICOAGULATION CONSULT NOTE  Pharmacy Consult for heparin Indication:  LV thrombus  No Known Allergies  Patient Measurements: Height: 5' (152.4 cm) Weight: 109.5 kg (241 lb 6.5 oz) IBW/kg (Calculated) : 45.5 Heparin Dosing Weight: 72kg  Vital Signs: Temp: 99 F (37.2 C) (11/29 0101) Temp Source: Oral (11/29 0101) BP: 122/53 (11/29 0101) Pulse Rate: 94 (11/29 0101)  Labs: Recent Labs    04/10/23 1837 04/11/23 0629 04/11/23 1606 04/12/23 0002  HGB 7.9* 7.4* 8.0*  --   HCT 26.7* 24.5* 25.5*  --   PLT 161 142* 138*  --   APTT  --  38*  --  64*  LABPROT  --  18.7*  --   --   INR  --  1.5*  --   --   CREATININE 2.43* 2.58*  --   --     Estimated Creatinine Clearance: 20.5 mL/min (A) (by C-G formula based on SCr of 2.58 mg/dL (H)).   Medical History: Past Medical History:  Diagnosis Date   Anxiety    Aortic valve stenosis 10/10/2016   Arthritis    "fingers" (07/24/2017)   CHF (congestive heart failure) (HCC)    COPD (chronic obstructive pulmonary disease) (HCC)    Depression    Gallstones    Heart murmur    High cholesterol    History of kidney stones    Hypertension    Hypothyroidism    Migraines    "none in years til 01/2013-02/2013; none since"  (07/24/2017)   Orthopnoea    "just since open heart surgery" (02/25/2013)   Pacemaker    [employer]    Pneumonia ~ 2012   "walking pneumonia"   Pneumonia 07/23/2017   Pneumonia 07/23/2017   Stroke (HCC) 2001   denies residual on 02/25/2013   Symptomatic hypotension 10/10/2016   Syncope and collapse    "prior to open heart surgery" (02/25/2013)   Type II diabetes mellitus (HCC)      Assessment: 75 yoF admitted with CHF and anemia. Pt with hx AF on apixaban PTA which was held for anemia ECHO today with LV thrombus, pharmacy consulted to dose IV heparin CBC low but stable, no obvious S/Sx bleeding noted. Will target lower goal for now and defer bolus.  11/29 AM update:  aPTT  sub-therapeutic   Goal of Therapy:  Heparin level 0.3-0.5 units/ml aPTT 66-85 seconds Monitor platelets by anticoagulation protocol: Yes   Plan:  Inc heparin to 1100 units/hr Re-check heparin level and aPTT in 8 hours  Abran Duke, PharmD, BCPS Clinical Pharmacist Phone: [phone]

## 2023-04-12 NOTE — Assessment & Plan Note (Addendum)
Cr 2.58 >2.38, baseline ~1.5-1.7.  Suspect cardiorenal syndrome given profound volume overload with poor output, expect improvement with diuresis. -Hold Jardiance and irbesartan -Diuresis as above -Consult Nephro if continuing to worsen -am bmp

## 2023-04-12 NOTE — Progress Notes (Signed)
PHARMACY - ANTICOAGULATION CONSULT NOTE  Pharmacy Consult for heparin Indication:  LV thrombus  No Known Allergies  Patient Measurements: Height: 5' (152.4 cm) Weight: 104.3 kg (229 lb 15 oz) IBW/kg (Calculated) : 45.5 Heparin Dosing Weight: 72kg  Vital Signs: Temp: 100.3 F (37.9 C) (11/29 1100) Temp Source: Oral (11/29 1100) BP: 107/61 (11/29 0753) Pulse Rate: 93 (11/29 0751)  Labs: Recent Labs    04/10/23 1837 04/11/23 0629 04/11/23 1606 04/12/23 0002 04/12/23 0308 04/12/23 0926  HGB 7.9* 7.4* 8.0*  --  8.0*  --   HCT 26.7* 24.5* 25.5*  --  26.7*  --   PLT 161 142* 138*  --  168  --   APTT  --  38*  --  64*  --  88*  LABPROT  --  18.7*  --   --   --   --   INR  --  1.5*  --   --   --   --   HEPARINUNFRC  --   --   --   --   --  >1.10*  CREATININE 2.43* 2.58*  --   --  2.38*  --     Estimated Creatinine Clearance: 21.6 mL/min (A) (by C-G formula based on SCr of 2.38 mg/dL (H)).   Medical History: Past Medical History:  Diagnosis Date   Anxiety    Aortic valve stenosis 10/10/2016   Arthritis    "fingers" (07/24/2017)   CHF (congestive heart failure) (HCC)    COPD (chronic obstructive pulmonary disease) (HCC)    Depression    Gallstones    Heart murmur    High cholesterol    History of kidney stones    Hypertension    Hypothyroidism    Migraines    "none in years til 01/2013-02/2013; none since"  (07/24/2017)   Orthopnoea    "just since open heart surgery" (02/25/2013)   Pacemaker    [employer]    Pneumonia ~ 2012   "walking pneumonia"   Pneumonia 07/23/2017   Pneumonia 07/23/2017   Stroke (HCC) 2001   denies residual on 02/25/2013   Symptomatic hypotension 10/10/2016   Syncope and collapse    "prior to open heart surgery" (02/25/2013)   Type II diabetes mellitus (HCC)      Assessment: 63 yoF admitted with CHF and anemia. Pt with hx AF on apixaban PTA which was held for anemia ECHO today with LV thrombus, pharmacy consulted to dose IV  heparin CBC low but stable, no obvious S/Sx bleeding noted.   Heparin level remains falsely elevated from recent DOAC use.  Aptt now just slightly above reduced goal range at 88 seconds.  No overt bleeding or complications noted.  Goal of Therapy:  Heparin level 0.3-0.5 units/ml aPTT 66-85 seconds Monitor platelets by anticoagulation protocol: Yes   Plan:  Decrease heparin to 1050 units/hr. Repeat aPTT with morning labs tomorrow. Daily heparin level, aPTT, and CBC.  Reece Leader, Colon Flattery, [facility] Clinical Pharmacist  04/12/2023 11:48 AM   [facility] pharmacy phone numbers are listed on [email or web address]

## 2023-04-12 NOTE — Assessment & Plan Note (Addendum)
Suspect CHF is driving patient's current dyspnea.  She is doing better from a respiratory standpoint, no longer on high flow and has not used as needed DuoNebs.  - wean O2 as able with goal O2 88-92%, home baseline 4 L -DuoNebs every 4 hours PRN -Continue Dulera 2 puffs BID (on Symbicort formulary equivalent) -Continue guaifenesin 600 mg BID -Follow-up blood cultures

## 2023-04-12 NOTE — Assessment & Plan Note (Signed)
A1c 6.0. Chart review shows home meds are 12 units Lantus nightly with sliding scale Humalog BID, glipizide 10 mg BID, and Jardiance 10 mg daily. AM glucose 164 -Sensitive SSI -Dose long-acting insulin if needed -Hold home Jardiance 10 mg in setting of AKI -Hold home glipizide 10 mg BID -Follow-up A1c

## 2023-04-12 NOTE — Assessment & Plan Note (Addendum)
Seen on echo yesterday.  Blood cx Ng2d but did fever this a.m.  - monitor fever curve, considering broad spec abx to cover possible endocarditis  - repeat blood cultures  -Continue heparin drip -Cardiology considering LHC to evaluate for coronary artery embolism

## 2023-04-13 DIAGNOSIS — I5043 Acute on chronic combined systolic (congestive) and diastolic (congestive) heart failure: Secondary | ICD-10-CM | POA: Diagnosis not present

## 2023-04-13 DIAGNOSIS — I5021 Acute systolic (congestive) heart failure: Secondary | ICD-10-CM | POA: Diagnosis not present

## 2023-04-13 LAB — CBC
HCT: 27.6 % — ABNORMAL LOW (ref 36.0–46.0)
Hemoglobin: 8.4 g/dL — ABNORMAL LOW (ref 12.0–15.0)
MCH: 29.6 pg (ref 26.0–34.0)
MCHC: 30.4 g/dL (ref 30.0–36.0)
MCV: 97.2 fL (ref 80.0–100.0)
Platelets: 179 10*3/uL (ref 150–400)
RBC: 2.84 MIL/uL — ABNORMAL LOW (ref 3.87–5.11)
RDW: 14.6 % (ref 11.5–15.5)
WBC: 10.3 10*3/uL (ref 4.0–10.5)
nRBC: 0 % (ref 0.0–0.2)

## 2023-04-13 LAB — BLOOD GAS, VENOUS
Acid-Base Excess: 20.6 mmol/L — ABNORMAL HIGH (ref 0.0–2.0)
Acid-Base Excess: 20.2 mmol/L — ABNORMAL HIGH (ref 0.0–2.0)
Bicarbonate: 50.2 mmol/L — ABNORMAL HIGH (ref 20.0–28.0)
Bicarbonate: 46.5 mmol/L — ABNORMAL HIGH (ref 20.0–28.0)
O2 Saturation: 72.8 %
O2 Saturation: 100 %
Patient temperature: 36.7
Patient temperature: 37
pCO2, Ven: 80 mm[Hg] (ref 44–60)
pCO2, Ven: 57 mm[Hg] (ref 44–60)
pH, Ven: 7.4 (ref 7.25–7.43)
pH, Ven: 7.52 — ABNORMAL HIGH (ref 7.25–7.43)
pO2, Ven: 43 mm[Hg] (ref 32–45)
pO2, Ven: 129 mm[Hg] — ABNORMAL HIGH (ref 32–45)

## 2023-04-13 LAB — BASIC METABOLIC PANEL
Anion gap: 11 (ref 5–15)
Anion gap: 14 (ref 5–15)
BUN: 30 mg/dL — ABNORMAL HIGH (ref 8–23)
BUN: 32 mg/dL — ABNORMAL HIGH (ref 8–23)
CO2: 37 mmol/L — ABNORMAL HIGH (ref 22–32)
CO2: 35 mmol/L — ABNORMAL HIGH (ref 22–32)
Calcium: 9.2 mg/dL (ref 8.9–10.3)
Calcium: 8.8 mg/dL — ABNORMAL LOW (ref 8.9–10.3)
Chloride: 93 mmol/L — ABNORMAL LOW (ref 98–111)
Chloride: 87 mmol/L — ABNORMAL LOW (ref 98–111)
Creatinine, Ser: 2.15 mg/dL — ABNORMAL HIGH (ref 0.44–1.00)
Creatinine, Ser: 1.82 mg/dL — ABNORMAL HIGH (ref 0.44–1.00)
GFR, Estimated: 23 mL/min — ABNORMAL LOW (ref >60)
GFR, Estimated: 28 mL/min — ABNORMAL LOW (ref >60)
Glucose, Bld: 188 mg/dL — ABNORMAL HIGH (ref 70–99)
Glucose, Bld: 211 mg/dL — ABNORMAL HIGH (ref 70–99)
Potassium: 3.8 mmol/L (ref 3.5–5.1)
Potassium: 3.6 mmol/L (ref 3.5–5.1)
Sodium: 141 mmol/L (ref 135–145)
Sodium: 136 mmol/L (ref 135–145)

## 2023-04-13 LAB — GLUCOSE, CAPILLARY
Glucose-Capillary: 135 mg/dL — ABNORMAL HIGH (ref 70–99)
Glucose-Capillary: 142 mg/dL — ABNORMAL HIGH (ref 70–99)
Glucose-Capillary: 157 mg/dL — ABNORMAL HIGH (ref 70–99)
Glucose-Capillary: 162 mg/dL — ABNORMAL HIGH (ref 70–99)
Glucose-Capillary: 165 mg/dL — ABNORMAL HIGH (ref 70–99)
Glucose-Capillary: 177 mg/dL — ABNORMAL HIGH (ref 70–99)

## 2023-04-13 LAB — HEPARIN LEVEL (UNFRACTIONATED): Heparin Unfractionated: >1.1 [IU]/mL — ABNORMAL HIGH (ref 0.30–0.70)

## 2023-04-13 LAB — APTT
aPTT: 58 s — ABNORMAL HIGH (ref 24–36)
aPTT: 62 s — ABNORMAL HIGH (ref 24–36)

## 2023-04-13 LAB — MAGNESIUM: Magnesium: 1.9 mg/dL (ref 1.7–2.4)

## 2023-04-13 MED ORDER — MAGNESIUM SULFATE 2 GM/50ML IV SOLN
2.0000 g | Freq: Once | INTRAVENOUS | Status: AC
  Administered 2023-04-13: 2 g via INTRAVENOUS
  Filled 2023-04-13: qty 50

## 2023-04-13 MED ORDER — POTASSIUM CHLORIDE 20 MEQ PO PACK
40.0000 meq | PACK | Freq: Once | ORAL | Status: AC
  Administered 2023-04-13: 40 meq via ORAL
  Filled 2023-04-13: qty 2

## 2023-04-13 MED ORDER — HYDRALAZINE HCL 25 MG PO TABS
25.0000 mg | ORAL_TABLET | Freq: Three times a day (TID) | ORAL | Status: DC
  Administered 2023-04-13 – 2023-04-24 (×33): 25 mg via ORAL
  Filled 2023-04-13 (×34): qty 1

## 2023-04-13 MED ORDER — ISOSORBIDE MONONITRATE ER 30 MG PO TB24
15.0000 mg | ORAL_TABLET | Freq: Every day | ORAL | Status: DC
  Administered 2023-04-13 – 2023-04-24 (×12): 15 mg via ORAL
  Filled 2023-04-13 (×12): qty 1

## 2023-04-13 MED ORDER — POTASSIUM CHLORIDE 10 MEQ/100ML IV SOLN
10.0000 meq | INTRAVENOUS | Status: AC
  Administered 2023-04-13 – 2023-04-14 (×4): 10 meq via INTRAVENOUS
  Filled 2023-04-13 (×4): qty 100

## 2023-04-13 NOTE — Progress Notes (Signed)
Cardiology Progress Note  Patient ID: Tonya Glass MRN: [id number] DOB: 01-May-1946 Date of Encounter: 04/13/2023 Primary Cardiologist: Chrystie Nose, MD  Subjective   Chief Complaint: SOB  HPI: Remains volume overloaded.  Good urine output.  Noticeable murmur on examination.  ROS:  All other ROS reviewed and negative. Pertinent positives noted in the HPI.     Telemetry  Overnight telemetry shows V paced rhythm 70s, which I personally reviewed.    Physical Exam   Vitals:   04/13/23 0036 04/13/23 0411 04/13/23 0803 04/13/23 0838  BP: (!) 139/44 (!) 131/43 114/61   Pulse:  78 74   Resp:  13 20   Temp: 99 F (37.2 C) 98.3 F (36.8 C) 98 F (36.7 C)   TempSrc: Oral Oral Oral   SpO2: 97% 100% 94% 95%  Weight:  99.2 kg    Height:        Intake/Output Summary (Last 24 hours) at 04/13/2023 0915 Last data filed at 04/13/2023 0910 Gross per 24 hour  Intake 122.63 ml  Output 3700 ml  Net -3577.37 ml       04/13/2023    4:11 AM 04/12/2023    4:16 AM 04/11/2023    3:43 AM  Last 3 Weights  Weight (lbs) 218 lb 11.1 oz 229 lb 15 oz 241 lb 6.5 oz  Weight (kg) 99.2 kg 104.3 kg 109.5 kg    Body mass index is 42.71 kg/m.  General: Well nourished, well developed, in no acute distress Head: Atraumatic, normal size  Eyes: PEERLA, EOMI  Neck: Supple, JVD 10 to 12 cm of water Endocrine: No thryomegaly Cardiac: Normal S1, S2; RRR; 2 out of 6 systolic ejection murmur Lungs: Crackles at the lung bases Abd: Soft, nontender, no hepatomegaly  Ext: Trace edema Musculoskeletal: No deformities, BUE and BLE strength normal and equal Skin: Warm and dry, no rashes   Neuro: Alert and oriented to person, place, time, and situation, CNII-XII grossly intact, no focal deficits  Psych: Normal mood and affect   Cardiac Studies  LHC/RHC 08/07/2021 1.  Patent coronary arteries with mild diffuse nonobstructive irregularity throughout.  There are no high-grade stenoses present. 2.  3+ aortic  insufficiency based on angiographic assessment  3.  Unable to direct a straight tip wire from the paravalvular space into the left ventricle 4.  Normal wedge pressure and LVEDP of 13 mmHg and 16 mmHg, respectively 5.  Moderate pulmonary hypertension with mean PA pressure 34 mmHg, transpulmonary gradient 21 mmHg, PVR 4.2 [place] units  Patient Profile  Tonya Glass is a 77 y.o. 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  Assessment & Plan   # Acute systolic heart failure, EF 35 to 40% # Wall motion abnormality in the LAD distribution # LV apical thrombus -Remains volume overloaded.  She has had good response to Lasix drip.  Will continue this today.  Kidney function electrolytes are stable. -We will start her on hydralazine 25 mg 3 times daily and Imdur 50 mg daily for afterload reduction.  We will add beta-blocker further GDMT as we are able. -Her wall motion abnormality in the LAD distribution is new.  Her left heart catheterization from March of last year was unremarkable.  Given the profound wall motion abnormality I think she merits repeat  left heart catheterization.  We will optimize her to try to do this early next week. -Her cardiomyopathy could also be due to RV pacing.  She is V paced 98% of the time.  Her wall motion abnormality could simply be due to RV apical pacing.  This should be considered. -Heparin drip for LV apical thrombus.  Transition to DOAC after heart cath.  # Bioprosthetic aortic valve # Paravalvular leak -Extensive workup last year.  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 Renaye Rakers notes  # CKD IIIB # AKI -AKI secondary to systolic heart failure.  Improving with  diuresis.  Close monitoring.  # Nonobstructive CAD -Left heart cath last year unremarkable.  Now with new wall motion abnormality.  Seems to be out of proportion to RV apical pacing.  Likely will need left heart catheterization.  # Pulmonary hypertension, group 3 # COPD # OSA -Seems to have elevated transpulmonary gradients on catheterization last year.  Seems to be related to group 3 PAH.  She needs to wear her CPAP.  # Paroxysmal atrial flutter -On heparin drip for now.  Transition back to Eliquis as you are able.  Will likely need left heart cath this admission.     For questions or updates, please contact [facility] Please consult [email or web address] for contact info under        Signed, Gerri Spore T. Flora Lipps, MD, [facility]  [facility]  04/13/2023 9:15 AM

## 2023-04-13 NOTE — Assessment & Plan Note (Addendum)
Holding home eliquis 5mg  BID.  -currently on heparin drip -AM BMP; goal K>4, mag>2

## 2023-04-13 NOTE — Assessment & Plan Note (Addendum)
Hgb stable at 8.4. Suspect combination of worsening anemia of chronic disease due to decline in renal function and hemodilution due to CHF.  -Continue home iron 325 mg daily -Hold home Eliquis 5 mg for now, currently on heparin drip -Occult stool blood card pending -AM CBC -Transfuse <7.0 Hgb given concern for volume overload

## 2023-04-13 NOTE — Assessment & Plan Note (Addendum)
A1c 6.0. Chart review shows home meds are 12 units Lantus nightly with sliding scale Humalog BID, glipizide 10 mg BID, and Jardiance 10 mg daily. AM glucose 135 -Sensitive SSI -Dose long-acting insulin if needed -Hold home Jardiance 10 mg in setting of AKI -Hold home glipizide 10 mg BID -Follow-up A1c

## 2023-04-13 NOTE — Assessment & Plan Note (Addendum)
Cr 2.58 >2.15 this morning, baseline ~1.5-1.7.  Suspect cardiorenal syndrome given profound volume overload with poor output, appears to be improving with diuresis. Once improved cardiology considering cath and GDMT. -Hold Jardiance and irbesartan -Diuresis as above -Consult Nephro if continuing to worsen -am bmp

## 2023-04-13 NOTE — Progress Notes (Signed)
PT Cancellation Note  Patient Details Name: Tonya Glass MRN: [id number] DOB: 09/05/1945   Cancelled Treatment:    Reason Eval/Treat Not Completed: Medical issues which prohibited therapy (PCO2 elevated (80). Pt lethargic and now on BiPAP. Will follow up at later date/time as schedule allows and pt able.)    Renaldo Fiddler PT, DPT Acute Rehabilitation Services Office [phone]  04/13/23 1:38 PM

## 2023-04-13 NOTE — Assessment & Plan Note (Addendum)
Seen on echo.  Blood cx Ng3d, one fever to 100.5 that did not return - monitor fever curve, considering broad spec abx to cover possible endocarditis  - repeat blood cultures NG <24h -Continue heparin drip -Cardiology considering LHC to evaluate for coronary artery embolism

## 2023-04-13 NOTE — Plan of Care (Signed)
  Problem: Education: Goal: Knowledge of General Education information will improve Description Including pain rating scale, medication(s)/side effects and non-pharmacologic comfort measures Outcome: Progressing   Problem: Health Behavior/Discharge Planning: Goal: Ability to manage health-related needs will improve Outcome: Progressing   

## 2023-04-13 NOTE — Progress Notes (Addendum)
PHARMACY - ANTICOAGULATION CONSULT NOTE  Pharmacy Consult for heparin Indication:  LV thrombus  No Known Allergies  Patient Measurements: Height: 5' (152.4 cm) Weight: 99.2 kg (218 lb 11.1 oz) IBW/kg (Calculated) : 45.5 Heparin Dosing Weight: 72kg  Vital Signs: Temp: 98.3 F (36.8 C) (11/30 0411) Temp Source: Oral (11/30 0411) BP: 131/43 (11/30 0411) Pulse Rate: 78 (11/30 0411)  Labs: Recent Labs    04/11/23 0629 04/11/23 1606 04/12/23 0002 04/12/23 0308 04/12/23 0926 04/13/23 0238  HGB 7.4* 8.0*  --  8.0*  --  8.4*  HCT 24.5* 25.5*  --  26.7*  --  27.6*  PLT 142* 138*  --  168  --  179  APTT 38*  --  64*  --  88* 58*  LABPROT 18.7*  --   --   --   --   --   INR 1.5*  --   --   --   --   --   HEPARINUNFRC  --   --   --   --  >1.10* >1.10*  CREATININE 2.58*  --   --  2.38*  --  2.15*    Estimated Creatinine Clearance: 23.2 mL/min (A) (by C-G formula based on SCr of 2.15 mg/dL (H)).   Medical History: Past Medical History:  Diagnosis Date   Anxiety    Aortic valve stenosis 10/10/2016   Arthritis    "fingers" (07/24/2017)   CHF (congestive heart failure) (HCC)    COPD (chronic obstructive pulmonary disease) (HCC)    Depression    Gallstones    Heart murmur    High cholesterol    History of kidney stones    Hypertension    Hypothyroidism    Migraines    "none in years til 01/2013-02/2013; none since"  (07/24/2017)   Orthopnoea    "just since open heart surgery" (02/25/2013)   Pacemaker    [employer]    Pneumonia ~ 2012   "walking pneumonia"   Pneumonia 07/23/2017   Pneumonia 07/23/2017   Stroke (HCC) 2001   denies residual on 02/25/2013   Symptomatic hypotension 10/10/2016   Syncope and collapse    "prior to open heart surgery" (02/25/2013)   Type II diabetes mellitus (HCC)      Assessment: 33 yoF admitted with CHF and anemia. Pt with hx AF on apixaban PTA which was held for anemia 11/27, ECHO 11/28 with LV thrombus, pharmacy consulted to  dose IV heparin.  Heparin level remains falsely elevated from recent DOAC use with heparin infusion at 1050 units/hour. APTT now just slightly below reduced goal range at 58 seconds. Hgb 8.4, Plt 179, stable. No overt bleeding or complications noted. Will continue to dose based on APTT until levels correlate.  - slightly subtherapeutic this admission at 1000 units/h - slightly supratherapeutic this admission at 1100 units/h  Goal of Therapy:  Heparin level 0.3-0.5 units/ml aPTT 66-85 seconds Monitor platelets by anticoagulation protocol: Yes   Plan:  Increase heparin infusion to 1100 units/hr Check aPTT in 8 hours Monitor daily heparin level, aPTT, and CBC Monitor for signs/symptoms bleeding Follow up transition to oral anticoagulation  Stephenie Acres, PharmD PGY1 Pharmacy Resident 04/13/2023 6:54 AM

## 2023-04-13 NOTE — Assessment & Plan Note (Addendum)
Suspect CHF is driving patient's current dyspnea.  Respiratory status has improved, has notlonger on high used as needed DuoNebs.  -wean O2 as able with goal O2 88-92%, home baseline 4 L -DuoNebs every 4 hours PRN -Continue Dulera 2 puffs BID (on Symbicort formulary equivalent) -Continue guaifenesin 600 mg BID

## 2023-04-13 NOTE — Progress Notes (Signed)
PHARMACY - ANTICOAGULATION CONSULT NOTE  Pharmacy Consult for heparin Indication:  LV thrombus  No Known Allergies  Patient Measurements: Height: 5' (152.4 cm) Weight: 99.2 kg (218 lb 11.1 oz) IBW/kg (Calculated) : 45.5 Heparin Dosing Weight: 72kg  Vital Signs: Temp: 98 F (36.7 C) (11/30 1940) Temp Source: Oral (11/30 1940) BP: 129/74 (11/30 1940) Pulse Rate: 83 (11/30 1940)  Labs: Recent Labs    04/11/23 0629 04/11/23 1606 04/12/23 0002 04/12/23 0308 04/12/23 0926 04/13/23 0238 04/13/23 1830  HGB 7.4* 8.0*  --  8.0*  --  8.4*  --   HCT 24.5* 25.5*  --  26.7*  --  27.6*  --   PLT 142* 138*  --  168  --  179  --   APTT 38*  --    < >  --  88* 58* 62*  LABPROT 18.7*  --   --   --   --   --   --   INR 1.5*  --   --   --   --   --   --   HEPARINUNFRC  --   --   --   --  >1.10* >1.10*  --   CREATININE 2.58*  --   --  2.38*  --  2.15*  --    < > = values in this interval not displayed.    Estimated Creatinine Clearance: 23.2 mL/min (A) (by C-G formula based on SCr of 2.15 mg/dL (H)).  Assessment: 65 yoF admitted with CHF and anemia. Pt with hx AF on apixaban PTA which was held for anemia 11/27, ECHO 11/28 with LV thrombus, pharmacy consulted to dose IV heparin.  Heparin level remains falsely elevated from recent DOAC use. aPTT just below reduced goal range at 62 seconds. No overt bleeding or complications noted, no problems per RN. Will continue to dose based on APTT until levels correlate.  Goal of Therapy:  Heparin level 0.3-0.5 units/ml aPTT 66-85 seconds Monitor platelets by anticoagulation protocol: Yes   Plan:  Increase heparin infusion to 1200 units/hr Check aPTT in 8 hours Monitor daily heparin level, aPTT, and CBC Monitor for signs/symptoms bleeding Follow up transition to oral anticoagulation  Thank you for involving pharmacy in this patient's care.  Loura Back, PharmD, BCPS Clinical Pharmacist Clinical phone for 04/13/2023 is  [phone] 04/13/2023 7:44 PM

## 2023-04-13 NOTE — Progress Notes (Addendum)
Daily Progress Note Intern Pager: [phone]  Patient name: Tonya Glass Medical record number: [id number] Date of birth: [birth year] Age: 77 y.o. Gender: female  Primary Care Provider: No primary care provider on file. Consultants: cardiology Code Status:   Code Status: Limited: Do not attempt resuscitation (DNR) -DNR-LIMITED -Do Not Intubate/DNI    Pt Overview and Major Events to Date:  04/10/23 - admitted  Assessment and Plan:  77yo female with PMHx HFpEF, HLD, aortic stenosis s/p valve replacement, sick sinus syndrome, hypothyroidism, HTN, CKD, COPD, T2DM presenting with acute on chronic heart failure exacerbation.  Plan for today includes continuing to diurese per cardiology recommendations.  Assessment & Plan Acute exacerbation of CHF (congestive heart failure) (HCC) Breathing comfortably on 5L [place] (uses 4 L at home). ECHO 11/28 with EF 35-40% and LV regional wall motion abnormalities, and small LV thrombus.  Currently on Lasix drip per cardiology. S/p metolazone x1. 24 hr UOP 4.5L. Afebrile, no leukocytosis. Repeat CXR yesterday stable. No new events on telemetry overnight. -Cardiology following, recs appreciated   - Lasix drip, considering cath  - Hold carvedilol given decompensated heart failure, restart as able -Daily weights, strict I&Os -PT OT evaluate and treat -consider treating for PNA if fevers LV thrombus Seen on echo.  Blood cx Ng3d, one fever to 100.5 that did not return - monitor fever curve, considering broad spec abx to cover possible endocarditis  - repeat blood cultures NG <24h -Continue heparin drip -Cardiology considering LHC to evaluate for coronary artery embolism Acute kidney injury superimposed on stage 3b chronic kidney disease (HCC) Cr 2.58 >2.15 this morning, baseline ~1.5-1.7.  Suspect cardiorenal syndrome given profound volume overload with poor output, appears to be improving with diuresis. Once improved cardiology considering cath and  GDMT. -Hold Jardiance and irbesartan -Diuresis as above -Consult Nephro if continuing to worsen -am bmp Anemia Hgb stable at 8.4. Suspect combination of worsening anemia of chronic disease due to decline in renal function and hemodilution due to CHF.  -Continue home iron 325 mg daily -Hold home Eliquis 5 mg for now, currently on heparin drip -Occult stool blood card pending -AM CBC -Transfuse <7.0 Hgb given concern for volume overload COPD (chronic obstructive pulmonary disease) (HCC) Suspect CHF is driving patient's current dyspnea.  Respiratory status has improved, has notlonger on high used as needed DuoNebs.  -wean O2 as able with goal O2 88-92%, home baseline 4 L -DuoNebs every 4 hours PRN -Continue Dulera 2 puffs BID (on Symbicort formulary equivalent) -Continue guaifenesin 600 mg BID DM2 (diabetes mellitus, type 2) (HCC) A1c 6.0. Chart review shows home meds are 12 units Lantus nightly with sliding scale Humalog BID, glipizide 10 mg BID, and Jardiance 10 mg daily. AM glucose 135 -Sensitive SSI -Dose long-acting insulin if needed -Hold home Jardiance 10 mg in setting of AKI -Hold home glipizide 10 mg BID -Follow-up A1c Paroxysmal atrial flutter (HCC) Holding home eliquis 5mg  BID.  -currently on heparin drip -AM BMP; goal K>4, mag>2   Chronic and Stable Problems:  Hypothyroidism-continue levothyroxine 75 mcg daily Depression-continue fluoxetine 40 mg daily, trazodone 50 mg nightly Peripheral neuropathy-continue gabapentin 600 mg nightly Osteoporosis-continue alendronate 70 mg every Sunday Hyperlipidemia-continue home atorvastatin 40 mg daily Hypertension-holding clonidine 0.2 mg nightly, holding carvedilol 3.125 mg twice daily, continue irbesartan 150 mg daily Wounds to right leg-wound care following continue compression wraps bilateral legs GERD-continue home carafate 1g TID, pantoprazole 40mg  PO BID   FEN/GI: heart healthy diet PPx: heparin Dispo:Home pending  clinical improvement  Subjective:  No acute events overnight. Resting comfortably this morning, states she doesn't know how she feels because she is still trying to sleep.   Objective: Temp:  [98 F (36.7 C)-100.3 F (37.9 C)] 98 F (36.7 C) (11/30 0803) Pulse Rate:  [74-95] 74 (11/30 0803) Resp:  [13-20] 20 (11/30 0803) BP: (114-140)/(43-61) 114/61 (11/30 0803) SpO2:  [94 %-100 %] 95 % (11/30 0838) Weight:  [99.2 kg] 99.2 kg (11/30 0411) Physical Exam: General: well appearing, no acute distress, intermittently falling back asleep during exam Cardiovascular: RRR, systolic murmur  Respiratory: Breathing comfortably on 5L O2. Some crackles at lung bases otherwise CTAB  Laboratory: Most recent CBC Lab Results  Component Value Date   WBC 10.3 04/13/2023   HGB 8.4 (L) 04/13/2023   HCT 27.6 (L) 04/13/2023   MCV 97.2 04/13/2023   PLT 179 04/13/2023   Most recent BMP    Latest Ref Rng & Units 04/13/2023    2:38 AM  BMP  Glucose 70 - 99 mg/dL 409   BUN 8 - 23 mg/dL 30   Creatinine 8.11 - 1.00 mg/dL 9.14   Sodium 782 - 956 mmol/L 141   Potassium 3.5 - 5.1 mmol/L 3.8   Chloride 98 - 111 mmol/L 93   CO2 22 - 32 mmol/L 37   Calcium 8.9 - 10.3 mg/dL 9.2    Imaging/Diagnostic Tests: CXR - stable bilateral lung opacities  Nicholaos Schippers, DO 04/13/2023, 10:39 AM  PGY-1, [facility] FPTS Intern pager: [phone], text pages welcome Secure chat group [facility] Teaching Service

## 2023-04-13 NOTE — Assessment & Plan Note (Addendum)
Breathing comfortably on 5L [place] (uses 4 L at home). ECHO 11/28 with EF 35-40% and LV regional wall motion abnormalities, and small LV thrombus.  Currently on Lasix drip per cardiology. S/p metolazone x1. 24 hr UOP 4.5L. Afebrile, no leukocytosis. Repeat CXR yesterday stable. No new events on telemetry overnight. -Cardiology following, recs appreciated   - Lasix drip, considering cath  - Hold carvedilol given decompensated heart failure, restart as able -Daily weights, strict I&Os -PT OT evaluate and treat -consider treating for PNA if fevers

## 2023-04-13 NOTE — Assessment & Plan Note (Deleted)
 Doubt GI source of anemia. Monitor Hgb as above. -Continue home Carafate 1 g TID -Continue pantoprazole 40 mg PO BID

## 2023-04-13 NOTE — Plan of Care (Signed)
FMTS Interim Progress Note  S:Evaluated pt at beside after being on bipap for a few hours. Pt is more alert and ready to get bipap mask off.  O: BP (!) 111/39 (BP Location: Left Wrist)   Pulse 80   Temp 99.5 F (37.5 C) (Oral)   Resp 20   Ht 5' (1.524 m)   Wt 99.2 kg   SpO2 100%   BMI 42.71 kg/m   Gen: no acute distress, alert, staying awake throughout conversation Resp: unchanged from my exam this morning  A/P: Somnolence Has improved with bipap Repeat VBG Will likely need BIPAP at night while sleeping  Rahi Chandonnet, DO 04/13/2023, 4:23 PM PGY-1, [facility] Service pager [phone]

## 2023-04-13 NOTE — Plan of Care (Signed)
FMTS Interim Progress Note  S:Night rounded w/ Dr Marsh Dolly. Pt was sleeping w/ [place], but quickly woke up as we walked into room. Pt responding appropriately, alert and oriented.  Feels like her breathing is much better compared to prior.  O: BP (!) 125/54   Pulse 85   Temp 98 F (36.7 C) (Oral)   Resp 15   Ht 5' (1.524 m)   Wt 99.2 kg   SpO2 91%   BMI 42.71 kg/m   Gen: alert, well appearing woman. NAD. Resp: Crackles on L lung base.  A/P: - Prior somnolence and acidosis improved. Pt resp status stable. Remains afebrile. Pt planning to go to sleep so spoke with nurse to have bipap replaced while sleeping. Pt agreeable with wearing Bipap.  - UOP 900cc charted since start of shift. Good UOP. - Monitor for fevers.   Lincoln Brigham, MD 04/13/2023, 10:22 PM PGY-2, [facility] Family Medicine Service pager [phone]

## 2023-04-13 NOTE — Progress Notes (Signed)
Pt lethargic earlier, based on blood gas results placed on BiPAP for couple of hours. Pt taken of BiPAP and blood gasses repeated. Pt in no distress, denied SOB. O2 requirements increased, she is on 10L O2 HFNC, SpO2 89-93% Marsh Dolly, MD notified of results and above findings. RN instructed to place pt back on BiPAP after pt eats.  MD will reassess pt later tonight.

## 2023-04-14 DIAGNOSIS — N1832 Chronic kidney disease, stage 3b: Secondary | ICD-10-CM | POA: Diagnosis not present

## 2023-04-14 DIAGNOSIS — I5043 Acute on chronic combined systolic (congestive) and diastolic (congestive) heart failure: Secondary | ICD-10-CM | POA: Diagnosis not present

## 2023-04-14 DIAGNOSIS — I5023 Acute on chronic systolic (congestive) heart failure: Secondary | ICD-10-CM

## 2023-04-14 DIAGNOSIS — I5021 Acute systolic (congestive) heart failure: Secondary | ICD-10-CM | POA: Diagnosis not present

## 2023-04-14 DIAGNOSIS — N179 Acute kidney failure, unspecified: Secondary | ICD-10-CM | POA: Diagnosis not present

## 2023-04-14 LAB — GLUCOSE, CAPILLARY
Glucose-Capillary: 156 mg/dL — ABNORMAL HIGH (ref 70–99)
Glucose-Capillary: 158 mg/dL — ABNORMAL HIGH (ref 70–99)
Glucose-Capillary: 159 mg/dL — ABNORMAL HIGH (ref 70–99)
Glucose-Capillary: 169 mg/dL — ABNORMAL HIGH (ref 70–99)
Glucose-Capillary: 175 mg/dL — ABNORMAL HIGH (ref 70–99)
Glucose-Capillary: 202 mg/dL — ABNORMAL HIGH (ref 70–99)

## 2023-04-14 LAB — BASIC METABOLIC PANEL
Anion gap: 9 (ref 5–15)
BUN: 30 mg/dL — ABNORMAL HIGH (ref 8–23)
CO2: 39 mmol/L — ABNORMAL HIGH (ref 22–32)
Calcium: 8.7 mg/dL — ABNORMAL LOW (ref 8.9–10.3)
Chloride: 89 mmol/L — ABNORMAL LOW (ref 98–111)
Creatinine, Ser: 2.01 mg/dL — ABNORMAL HIGH (ref 0.44–1.00)
GFR, Estimated: 25 mL/min — ABNORMAL LOW (ref >60)
Glucose, Bld: 184 mg/dL — ABNORMAL HIGH (ref 70–99)
Potassium: 3.9 mmol/L (ref 3.5–5.1)
Sodium: 137 mmol/L (ref 135–145)

## 2023-04-14 LAB — HEPARIN LEVEL (UNFRACTIONATED): Heparin Unfractionated: >1.1 [IU]/mL — ABNORMAL HIGH (ref 0.30–0.70)

## 2023-04-14 LAB — MRSA NEXT GEN BY PCR, NASAL: MRSA by PCR Next Gen: DETECTED — AB

## 2023-04-14 LAB — CBC
HCT: 25.1 % — ABNORMAL LOW (ref 36.0–46.0)
Hemoglobin: 7.5 g/dL — ABNORMAL LOW (ref 12.0–15.0)
MCH: 28.2 pg (ref 26.0–34.0)
MCHC: 29.9 g/dL — ABNORMAL LOW (ref 30.0–36.0)
MCV: 94.4 fL (ref 80.0–100.0)
Platelets: 182 10*3/uL (ref 150–400)
RBC: 2.66 MIL/uL — ABNORMAL LOW (ref 3.87–5.11)
RDW: 14.6 % (ref 11.5–15.5)
WBC: 10.9 10*3/uL — ABNORMAL HIGH (ref 4.0–10.5)
nRBC: 0.2 % (ref 0.0–0.2)

## 2023-04-14 LAB — HEMOGLOBIN AND HEMATOCRIT, BLOOD
HCT: 26.5 % — ABNORMAL LOW (ref 36.0–46.0)
Hemoglobin: 8.3 g/dL — ABNORMAL LOW (ref 12.0–15.0)

## 2023-04-14 LAB — APTT
aPTT: 84 s — ABNORMAL HIGH (ref 24–36)
aPTT: 95 s — ABNORMAL HIGH (ref 24–36)

## 2023-04-14 LAB — PREPARE RBC (CROSSMATCH)

## 2023-04-14 LAB — MAGNESIUM: Magnesium: 2.1 mg/dL (ref 1.7–2.4)

## 2023-04-14 LAB — ABO/RH: ABO/RH(D): A POS

## 2023-04-14 MED ORDER — VANCOMYCIN HCL IN DEXTROSE 1-5 GM/200ML-% IV SOLN
1000.0000 mg | Freq: Once | INTRAVENOUS | Status: AC
  Administered 2023-04-14: 1000 mg via INTRAVENOUS
  Filled 2023-04-14: qty 200

## 2023-04-14 MED ORDER — CHLORHEXIDINE GLUCONATE CLOTH 2 % EX PADS
6.0000 | MEDICATED_PAD | Freq: Every day | CUTANEOUS | Status: AC
  Administered 2023-04-15 – 2023-04-19 (×5): 6 via TOPICAL

## 2023-04-14 MED ORDER — MUPIROCIN 2 % EX OINT
1.0000 | TOPICAL_OINTMENT | Freq: Two times a day (BID) | CUTANEOUS | Status: AC
  Administered 2023-04-14 – 2023-04-19 (×10): 1 via NASAL
  Filled 2023-04-14 (×2): qty 22

## 2023-04-14 MED ORDER — CARVEDILOL 3.125 MG PO TABS
3.1250 mg | ORAL_TABLET | Freq: Two times a day (BID) | ORAL | Status: DC
  Administered 2023-04-14 – 2023-04-24 (×21): 3.125 mg via ORAL
  Filled 2023-04-14 (×21): qty 1

## 2023-04-14 MED ORDER — SODIUM CHLORIDE 0.9 % IV SOLN
2.0000 g | INTRAVENOUS | Status: AC
  Administered 2023-04-14 – 2023-04-18 (×5): 2 g via INTRAVENOUS
  Filled 2023-04-14 (×5): qty 12.5

## 2023-04-14 MED ORDER — SENNA 8.6 MG PO TABS
1.0000 | ORAL_TABLET | Freq: Every day | ORAL | Status: DC
  Administered 2023-04-14 – 2023-04-19 (×4): 8.6 mg via ORAL
  Filled 2023-04-14 (×4): qty 1

## 2023-04-14 MED ORDER — POLYETHYLENE GLYCOL 3350 17 G PO PACK
17.0000 g | PACK | Freq: Two times a day (BID) | ORAL | Status: DC
  Administered 2023-04-14 – 2023-04-19 (×9): 17 g via ORAL
  Filled 2023-04-14 (×9): qty 1

## 2023-04-14 MED ORDER — UMECLIDINIUM BROMIDE 62.5 MCG/ACT IN AEPB
1.0000 | INHALATION_SPRAY | Freq: Every day | RESPIRATORY_TRACT | Status: DC
  Administered 2023-04-15 – 2023-04-24 (×9): 1 via RESPIRATORY_TRACT
  Filled 2023-04-14: qty 7

## 2023-04-14 MED ORDER — SODIUM CHLORIDE 0.9% IV SOLUTION: Freq: Once | INTRAVENOUS | Status: AC

## 2023-04-14 MED ORDER — TRAZODONE HCL 50 MG PO TABS
25.0000 mg | ORAL_TABLET | Freq: Every day | ORAL | Status: DC
  Administered 2023-04-14 – 2023-04-23 (×10): 25 mg via ORAL
  Filled 2023-04-14 (×10): qty 1

## 2023-04-14 MED ORDER — VANCOMYCIN VARIABLE DOSE PER UNSTABLE RENAL FUNCTION (PHARMACIST DOSING): Status: DC

## 2023-04-14 MED ORDER — ACETAZOLAMIDE 250 MG PO TABS
250.0000 mg | ORAL_TABLET | Freq: Once | ORAL | Status: AC
  Administered 2023-04-14: 250 mg via ORAL
  Filled 2023-04-14: qty 1

## 2023-04-14 NOTE — Assessment & Plan Note (Addendum)
Seen on echo.  Blood cx Ng3d, had a one-time fever to 100.5 few days ago but have remained afebrile in the last 24 hours.  Blood cultures so far without growth.  Will continue close monitoring.  - monitor fever curve, considering broad spec abx to cover possible endocarditis  -Continue heparin drip -Cardiology considering LHC to evaluate for coronary artery embolism

## 2023-04-14 NOTE — Progress Notes (Signed)
PHARMACY - ANTICOAGULATION CONSULT NOTE  Pharmacy Consult for heparin Indication:  LV thrombus  No Known Allergies  Patient Measurements: Height: 5' (152.4 cm) Weight: 98.8 kg (217 lb 13 oz) IBW/kg (Calculated) : 45.5 Heparin Dosing Weight: 72kg  Vital Signs: Temp: 98.3 F (36.8 C) (12/01 1942) Temp Source: Oral (12/01 1942) BP: 111/45 (12/01 1942) Pulse Rate: 87 (12/01 1942)  Labs: Recent Labs    04/12/23 0308 04/12/23 0926 04/13/23 0238 04/13/23 1830 04/13/23 2006 04/14/23 0507 04/14/23 2007  HGB 8.0*  --  8.4*  --   --  7.5*  --   HCT 26.7*  --  27.6*  --   --  25.1*  --   PLT 168  --  179  --   --  182  --   APTT  --  88* 58* 62*  --  84* 95*  HEPARINUNFRC  --  >1.10* >1.10*  --   --  >1.10*  --   CREATININE 2.38*  --  2.15*  --  1.82* 2.01*  --     Estimated Creatinine Clearance: 24.7 mL/min (A) (by C-G formula based on SCr of 2.01 mg/dL (H)).  Assessment: 6 yoF admitted with CHF and anemia. Pt with hx AF on apixaban PTA which was held for anemia 11/27, ECHO 11/28 with LV thrombus, pharmacy consulted to dose IV heparin.  Heparin level remains falsely elevated from recent DOAC use. aPTT is supratherapeutic at 95 seconds on 1150 units/hr despite last rate decrease. She did receive 1 units RBC for Hgb 7.5. Plt 182 stable. No overt bleeding or complications noted, no problems with the infusion per RN. Will continue to dose based on APTT until levels correlate.  Goal of Therapy:  Heparin level 0.3-0.5 units/ml aPTT 66-85 seconds Monitor platelets by anticoagulation protocol: Yes   Plan:  Decrease heparin infusion to 1000 units/hr Check aPTT in 8 hours Monitor daily heparin level, aPTT, and CBC Monitor for signs/symptoms bleeding Follow up transition to oral anticoagulation  Thank you for involving pharmacy in this patient's care.  Loura Back, PharmD, BCPS Clinical Pharmacist Clinical phone for 04/14/2023 is [phone] 04/14/2023 9:17 PM

## 2023-04-14 NOTE — Assessment & Plan Note (Addendum)
Currently diuresing on Lasix drip with good UOP.  Had about 2.3L output in the last 24 hours and a total of 8L since admission.  Endorses improved breathing however still has increased oxygen requirement. If no improvement in 24hrs will consider abx coverage for pneumonia.  On 9L HFNC -Cardiology following, recs appreciated   -Continue Lasix drip -Hydralazine 25 mg TID, Imdur 15 mg daily, carvedilol 3.125 mg BID added -Daily weights, strict I&Os -PT OT evaluate and treat -consider treating for PNA if fevers

## 2023-04-14 NOTE — Progress Notes (Signed)
PHARMACY - ANTICOAGULATION CONSULT NOTE  Pharmacy Consult for heparin Indication:  LV thrombus  No Known Allergies  Patient Measurements: Height: 5' (152.4 cm) Weight: 98.8 kg (217 lb 13 oz) IBW/kg (Calculated) : 45.5 Heparin Dosing Weight: 72kg  Vital Signs: Temp: 99.9 F (37.7 C) (12/01 0422) Temp Source: Oral (12/01 0422) BP: 125/94 (12/01 0422) Pulse Rate: 85 (12/01 0422)  Labs: Recent Labs    04/12/23 0308 04/12/23 0926 04/13/23 0238 04/13/23 1830 04/13/23 2006 04/14/23 0507  HGB 8.0*  --  8.4*  --   --  7.5*  HCT 26.7*  --  27.6*  --   --  25.1*  PLT 168  --  179  --   --  182  APTT  --  88* 58* 62*  --  84*  HEPARINUNFRC  --  >1.10* >1.10*  --   --  >1.10*  CREATININE 2.38*  --  2.15*  --  1.82* 2.01*    Estimated Creatinine Clearance: 24.7 mL/min (A) (by C-G formula based on SCr of 2.01 mg/dL (H)).  Assessment: 66 yoF admitted with CHF and anemia. Pt with hx AF on apixaban PTA which was held for anemia 11/27, ECHO 11/28 with LV thrombus, pharmacy consulted to dose IV heparin.  Heparin level remains falsely elevated from recent DOAC use. aPTT therapeutic at 84 seconds (nearing supra therapeutic for reduced therapeutic range) with heparin infusion at 1200 units/h. Hgb 7.5, decreased from 8.4. Plt 182 stable. No overt bleeding or complications noted, no problems with the infusion per RN. Will continue to dose based on APTT until levels correlate.  Goal of Therapy:  Heparin level 0.3-0.5 units/ml aPTT 66-85 seconds Monitor platelets by anticoagulation protocol: Yes   Plan:  Decrease heparin infusion slightly to 1150 units/h Check aPTT in 8 hours Monitor daily heparin level, aPTT, and CBC Monitor for signs/symptoms bleeding Follow up transition to oral anticoagulation  Thank you for involving pharmacy in this patient's care.  Stephenie Acres, PharmD PGY1 Pharmacy Resident 04/14/2023 6:59 AM

## 2023-04-14 NOTE — Progress Notes (Signed)
Pt requested BiPAP to be off, per pt she cannot tolerated Bipap because it hurts her face.  Will attempt to place it back on in about .

## 2023-04-14 NOTE — Plan of Care (Signed)
FMTS Brief Progress Note  S: Patient resting comfortably in bed on exam this evening.  No current complaints.  Patient is agreeable to wearing BiPAP until it annoys her.   O: BP (!) 120/47   Pulse 90   Temp 97.7 F (36.5 C) (Oral)   Resp 18   Ht 5' (1.524 m)   Wt 98.8 kg   SpO2 (!) 88%   BMI 42.54 kg/m   General: A&O, NAD Cardiac: RRR, no M/R/G Respiratory: Mild expiratory wheeze in the bilateral lung bases.  No increased work of breathing.  Comfortable on 4 L oxygen Exremeties: 2+ peripheral pulses in BUE  A/P: Monitor respiratory status overnight, patient will wear BiPAP as tolerated, RT has been notified. - Orders reviewed. Labs for AM ordered, which was adjusted as needed.  - If condition changes, plan includes re-evaluation and titration of O2.   Gerrit Heck, DO 04/14/2023, 7:10 PM PGY-1, [facility] Night Resident  Please page [phone] with questions.

## 2023-04-14 NOTE — Progress Notes (Signed)
Pt placed back on BiPAP

## 2023-04-14 NOTE — Assessment & Plan Note (Signed)
A1c 6.0.  CBG in last 24 hours have remained appropriate.  Needed only 5 unit SCI in the last 24 hours. -Continue sensitive SSI -Dose long-acting insulin if needed -Hold home Jardiance 10 mg in setting of AKI -Hold home glipizide 10 mg BID

## 2023-04-14 NOTE — Assessment & Plan Note (Addendum)
Hgb 7.5 (8.4 yesterday).  No Sign of acute bleeding, suspected to be secondary to anemia of chronic disease. Will transfuse with 1 pRBC -Continue home iron 325 mg daily -Occult stool blood card pending - Follow post H&H -Trend with Daily AM CBC -Transfuse <8.0 Hgb given she has has significant UOP with diuresis

## 2023-04-14 NOTE — Assessment & Plan Note (Signed)
Cr 2.01 this morning, baseline ~1.5-1.7.  Creatinine slightly increased from yesterday, suspect this to be secondary to ongoing diuresis.  Will continue close monitoring.  -Hold Jardiance and irbesartan -Diuresis as above -Consult Nephro if continuing to worsen -am bmp

## 2023-04-14 NOTE — Progress Notes (Signed)
Pharmacy Antibiotic Note  Tonya Glass is a 77 y.o. female admitted on 04/10/2023 with heart failure exacerbation/ LV thrombus. Now with worsening oxygen requirements, suspect pneumonia. Pharmacy has been consulted for vancomycin and cefepime dosing.  CXR 11/29:  Stable patchy bilateral lung opacities concerning for edema or possibly multifocal inflammation Worsening oxygen requirements, 5L [place] > 8L HFNC  WBC 10.9, slightly elevated, Afebrile, SBP 100-110s, RR 18, HR 80s Scr 2, increased from 1.8 (BL ~1.5-1.7?), Net IO Since Admission: -8,153.71 mL [04/14/23 1244]  Plan: Cefepime 2 g IV every 24 hours Vancomycin 2000 mg IV x1 loading dose > Unstable renal function. Will monitor and order future doses accordingly > Goal trough 15-20 mcg/mL. Goal AUC 400-600 > Obtain levels as clinically indicated per pharmacy protocol Monitor CBC, renal function, and signs of clinical improvement Follow up MRSA PCR results and cultures Deescalate as able   Height: 5' (152.4 cm) Weight: 98.8 kg (217 lb 13 oz) IBW/kg (Calculated) : 45.5  Temp (24hrs), Avg:99 F (37.2 C), Min:98 F (36.7 C), Max:99.9 F (37.7 C)  Recent Labs  Lab 04/11/23 0629 04/11/23 1606 04/12/23 0308 04/13/23 0238 04/13/23 2006 04/14/23 0507  WBC 7.6 10.5 11.8* 10.3  --  10.9*  CREATININE 2.58*  --  2.38* 2.15* 1.82* 2.01*    Estimated Creatinine Clearance: 24.7 mL/min (A) (by C-G formula based on SCr of 2.01 mg/dL (H)).    No Known Allergies  Antimicrobials this admission: 12/1 vancomycin  >>  12/1 cefepime  >>    Microbiology results: 11/27 Bcx: no growth 4 days 11/27 Viral panel: negative 11/29 BCx: no growth 2 days 12/1 MRSA PCR: in process  Thank you for allowing pharmacy to be a part of this patient's care.  Stephenie Acres, PharmD PGY1 Pharmacy Resident 04/14/2023 12:09 PM

## 2023-04-14 NOTE — Assessment & Plan Note (Addendum)
Holding home eliquis 5mg  BID.   -currently on heparin drip for LV apical thrombus -AM BMP; goal K>4, mag>2  Constipation No BM in 3 days.  Added laxatives including MiraLAX and senna.

## 2023-04-14 NOTE — Progress Notes (Signed)
Cardiology Progress Note  Patient ID: Tonya Glass MRN: [id number] DOB: [birth year] Date of Encounter: 04/14/2023 Primary Cardiologist: Chrystie Nose, MD  Subjective   Chief Complaint: SOB  HPI: Oxygen requirement has increased per nursing.  Good diuresis.  Still volume up.  ROS:  All other ROS reviewed and negative. Pertinent positives noted in the HPI.     Telemetry  Overnight telemetry shows V paced 90s, which I personally reviewed.    Physical Exam   Vitals:   04/14/23 0422 04/14/23 0615 04/14/23 0713 04/14/23 0811  BP: (!) 125/94  (!) 113/51   Pulse: 85  95   Resp: 15  17   Temp: 99.9 F (37.7 C)  99.5 F (37.5 C)   TempSrc: Oral  Axillary   SpO2: 96% 99% 96% 92%  Weight: 98.8 kg     Height:        Intake/Output Summary (Last 24 hours) at 04/14/2023 0835 Last data filed at 04/14/2023 0433 Gross per 24 hour  Intake 932.04 ml  Output 2350 ml  Net -1417.96 ml       04/14/2023    4:22 AM 04/13/2023    4:11 AM 04/12/2023    4:16 AM  Last 3 Weights  Weight (lbs) 217 lb 13 oz 218 lb 11.1 oz 229 lb 15 oz  Weight (kg) 98.8 kg 99.2 kg 104.3 kg    Body mass index is 42.54 kg/m.  General: Well nourished, well developed, in no acute distress Head: Atraumatic, normal size  Eyes: PEERLA, EOMI  Neck: Supple, JVD 10 to 12 cm of water Endocrine: No thryomegaly Cardiac: Normal S1, S2; RRR; no murmurs, rubs, or gallops Lungs: Crackles at the lung bases, diminished breath sounds Abd: Soft, nontender, no hepatomegaly  Ext: Trace edema Musculoskeletal: No deformities, BUE and BLE strength normal and equal Skin: Warm and dry, no rashes   Neuro: Alert and oriented to person, place, time, and situation, CNII-XII grossly intact, no focal deficits  Psych: Normal mood and affect   Cardiac Studies  TTE 04/11/2023  1. Left ventricular ejection fraction, by estimation, is 35 to 40%. The  left ventricle has moderately decreased function. The left ventricle  demonstrates  regional wall motion abnormalities with hypokinesis of the  mid to distal inferoseptal wall and  distal anteroseptal wall; however, complete wall motion assessment limited  by poor visualization of endocardial borders and off axis angles. There is  a small LV apical thrombus.   2. Right ventricular systolic function is normal. The right ventricular  size is normal.   3. Left atrial size was mildly dilated.   4. The mitral valve is normal in structure. Mild mitral valve  regurgitation. No evidence of mitral stenosis.   5. Well seated bioprosthetic aortic valve with no paravalvular  regurgitation. Aortic valve mean gradient measures 19.0 mmHg.   6. The inferior vena cava is normal in size with greater than 50%  respiratory variability, suggesting right atrial pressure of 3 mmHg.   Patient Profile  Tonya Glass is a 77 y.o. 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   Assessment & Plan   # Acute on chronic systolic heart failure, EF 35-40% # Well-nourished abnormality in the LAD distribution -Remains volume overloaded.  Net -1.4 L overnight.  She is  net -8.1 L admission.  Oxygen requirement continues to climb.  We will continue with diuresis today.  Discussed with primary team possibility of pneumonia.  We will see how she does today. -Continue Lasix drip.  Kidney function is stable.  We have added hydralazine 25 mg 3 times daily and Imdur 15 mg daily for afterload reduction. -I have added acetazolamide given rising bicarbonate. -Add carvedilol 3.125 mg twice daily. -Left heart catheterization with minimal CAD last year.  She also is RV paced 98% of the time.  I do question RV pacing.  Explain her  cardiomyopathy.  Regardless she will need a left heart catheterization.  At this point given her high oxygen requirement I do not believe she can tolerate the procedure.  Would continue with aggressive diuresis and workup for pneumonia to optimize her for cardiac cath  # LV apical thrombus -Noted on echo.  Continue with heparin drip.  Transition back to DOAC after cath.  # CKD stage IIIb # AKI -Kidney function is stable.  For now continue with diuresis.  Close monitoring of renal output.  # Pulmonary hypertension, group 3 # COPD # OSA -Known history of group 3 pulmonary hypertension.  Confirmed a right heart catheterization last year.  Continue to wear CPAP.  I do think a lot of her decompensation is due to systolic dysfunction.  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  They do not believe this is worsening.  She does have a murmur on exam but likely some element of bioprosthetic valve stenosis is present.  # Nonobstructive CAD -Cath last year with minimal CAD.  No wall motion abnormality described above.  Plan for heart cath at some point as kidney function allows. -Continue statin.  Not on aspirin as she is on Eliquis.  # Paroxysmal atrial flutter -Maintaining sinus rhythm.  Starting beta-blocker today.  Plan for heparin drip until no further need for procedures.  Transition back to DOAC as you are able.      For questions or updates, please contact [facility] Please consult [email or web address] for contact info under      Signed, Gerri Spore T. Flora Lipps, MD, [facility]  [facility]  04/14/2023 8:35 AM

## 2023-04-14 NOTE — Assessment & Plan Note (Signed)
Suspect CHF is driving patient's current dyspnea.  Respiratory status has improved, has notlonger on high used as needed DuoNebs.  -wean O2 as able with goal O2 88-92%, home baseline 4 L -DuoNebs every 4 hours PRN -Continue Dulera 2 puffs BID (on Symbicort formulary equivalent) -Continue guaifenesin 600 mg BID -Nightly BiPAP

## 2023-04-14 NOTE — Progress Notes (Addendum)
Daily Progress Note Intern Pager: [phone]  Patient name: Tonya Glass Medical record number: [id number] Date of birth: Sep 21, 1945 Age: 77 y.o. Gender: female  Primary Care Provider: No primary care provider on file. Consultants: Cardiology Code Status: DNR-Limited/DNI  Pt Overview and Major Events to Date:  04/10/2023-admitted  Assessment and Plan:  Tonya Glass is a 77 year old female admitted for CHF exacerbation. Pertinent PMH/PSH includes HFpEF, HLD, aortic stenosis s/p valve replacement, sick sinus syndrome, hypothyroidism, HTN, CKD, COPD on home oxygen 4L, T2DM.  Assessment & Plan Acute exacerbation of CHF (congestive heart failure) (HCC) Currently diuresing on Lasix drip with good UOP.  Had about 2.3L output in the last 24 hours and a total of 8L since admission.  Endorses improved breathing however still has increased oxygen requirement. If no improvement in 24hrs will consider abx coverage for pneumonia.  On 9L HFNC -Cardiology following, recs appreciated   -Continue Lasix drip -Hydralazine 25 mg TID, Imdur 15 mg daily, carvedilol 3.125 mg BID added -Daily weights, strict I&Os -PT OT evaluate and treat -consider treating for PNA if fevers LV thrombus Seen on echo.  Blood cx Ng3d, had a one-time fever to 100.5 few days ago but have remained afebrile in the last 24 hours.  Blood cultures so far without growth.  Will continue close monitoring.  - monitor fever curve, considering broad spec abx to cover possible endocarditis  -Continue heparin drip -Cardiology considering LHC to evaluate for coronary artery embolism Acute kidney injury superimposed on stage 3b chronic kidney disease (HCC) Cr 2.01 this morning, baseline ~1.5-1.7.  Creatinine slightly increased from yesterday, suspect this to be secondary to ongoing diuresis.  Will continue close monitoring.  -Hold Jardiance and irbesartan -Diuresis as above -Consult Nephro if continuing to worsen -am bmp Anemia Hgb  7.5 (8.4 yesterday).  No Sign of acute bleeding, suspected to be secondary to anemia of chronic disease. Will transfuse with 1 pRBC -Continue home iron 325 mg daily -Occult stool blood card pending - Follow post H&H -Trend with Daily AM CBC -Transfuse <8.0 Hgb given she has has significant UOP with diuresis  COPD (chronic obstructive pulmonary disease) (HCC) Suspect CHF is driving patient's current dyspnea.  Respiratory status has improved, has notlonger on high used as needed DuoNebs.  -wean O2 as able with goal O2 88-92%, home baseline 4 L -DuoNebs every 4 hours PRN -Continue Dulera 2 puffs BID (on Symbicort formulary equivalent) -Continue guaifenesin 600 mg BID -Nightly BiPAP DM2 (diabetes mellitus, type 2) (HCC) A1c 6.0.  CBG in last 24 hours have remained appropriate.  Needed only 5 unit SCI in the last 24 hours. -Continue sensitive SSI -Dose long-acting insulin if needed -Hold home Jardiance 10 mg in setting of AKI -Hold home glipizide 10 mg BID  Paroxysmal atrial flutter (HCC) Holding home eliquis 5mg  BID.   -currently on heparin drip for LV apical thrombus -AM BMP; goal K>4, mag>2  Constipation No BM in 3 days.  Added laxatives including MiraLAX and senna.   Chronic and Stable Problems:  Hypothyroidism-continue levothyroxine 75 mcg daily Depression-continue fluoxetine 40 mg daily, decreased trazodone to 25 mg nightly given somnolence Peripheral neuropathy-continue gabapentin 600 mg nightly Osteoporosis-continue alendronate 70 mg every Sunday Hyperlipidemia-continue home atorvastatin 40 mg daily Hypertension-holding clonidine 0.2 mg nightly, holding carvedilol 3.125 mg twice daily, continue irbesartan 150 mg daily Wounds to right leg-wound care following continue compression wraps bilateral legs GERD-continue home carafate 1g TID, pantoprazole 40mg  PO BID   FEN/GI: Heart healthy diet PPx: Heparin  Dispo:Home pending clinical improvement . Barriers include needing  continued diuresis.   Subjective:  Patient says she is doing well.  And breathing has since improved, a little sleepy this morning the patient said this is normal for her to be sleepy in the morning and needing additional 2 hours of sleep before she starts her day.  Attributes it to poor sleeping during night.  Objective: Temp:  [98 F (36.7 C)-99.9 F (37.7 C)] 99.5 F (37.5 C) (12/01 0713) Pulse Rate:  [73-95] 95 (12/01 0713) Resp:  [15-20] 17 (12/01 0713) BP: (111-134)/(39-94) 113/51 (12/01 0713) SpO2:  [86 %-100 %] 96 % (12/01 0713) FiO2 (%):  [40 %] 40 % (11/30 2319) Weight:  [98.8 kg] 98.8 kg (12/01 0422) Physical Exam: General: Sleepy, morbidly obese, NAD Cardiovascular: RRR, no murmurs Respiratory: Normal WOB on 9L HFNC Abdomen: Soft, no tenderness or distention Extremities: LLE wrapped in Unna boots.  Laboratory: Most recent CBC Lab Results  Component Value Date   WBC 10.9 (H) 04/14/2023   HGB 7.5 (L) 04/14/2023   HCT 25.1 (L) 04/14/2023   MCV 94.4 04/14/2023   PLT 182 04/14/2023   Most recent BMP    Latest Ref Rng & Units 04/14/2023    5:07 AM  BMP  Glucose 70 - 99 mg/dL 425   BUN 8 - 23 mg/dL 30   Creatinine 9.56 - 1.00 mg/dL 3.87   Sodium 564 - 332 mmol/L 137   Potassium 3.5 - 5.1 mmol/L 3.9   Chloride 98 - 111 mmol/L 89   CO2 22 - 32 mmol/L 39   Calcium 8.9 - 10.3 mg/dL 8.7     Other pertinent labs  Mg: 2.1  Imaging/Diagnostic Tests: No new images  Jerre Simon, MD 04/14/2023, 7:57 AM  PGY-3, [facility] FPTS Intern pager: [phone], text pages welcome Secure chat group [facility] Teaching Service

## 2023-04-14 NOTE — Plan of Care (Signed)
Saw patient for midday rounds.  On arrival patient was laying down comfortably in bed.  She is doing fine and feels like her breathing is much better despite having high oxygen requirement.  Endorses cough which for patient is chronic due to COPD.  Does not report any worsening cough or sputum production.  Today we obtained a nasal MRSA swab which I informed her came back positive and given that she has continued to have high oxygen requirement despite good urine outputs with diuresis we started on cefepime and vancomycin for CAP coverage.  In the room patient was started 97% on 8 L HFNC.  Discussed with nurse that given her history of COPD O2 sats goal should be 88-92%.  While in the room was able to wean patient down to 5L and O2 sats stabilized at 95%.  Inform nurse we can try to wean her down to 3L by end of shift and to monitor pulse ox with weaning.  Patient appears to be doing well, normal work of breathing and more awake this afternoon than earlier in the day when I saw her.  Of note patient's hemoglobin this morning was 7.5.  Given her history of CAD/CHF threshold for transfusion is less than 8.  Transfusion with 1 units pRBC just completed.  Will follow-up with post H&H.   Jerre Simon, MD PGY-3, [facility] Resident

## 2023-04-14 NOTE — Evaluation (Signed)
Physical Therapy Evaluation Patient Details Name: Tonya Glass MRN: [id number] DOB: Sep 14, 1945 Today's Date: 04/14/2023  History of Present Illness  Tonya Glass is 77 y.o. admitted for acute on chronic heart failure exacerbation. Small LV thrombus noted with AKI and anemia. Pt requiring intermittent bipap. Pertinent PMH/PSH includes HFpEF, HLD, Aortic stenosis s/p valve replacement, Sick sinus syndrome, Hypothyroidism, HTN, CKD, COPD, T2DM.  Clinical Impression  Pt admitted with above diagnosis and presents to PT with functional limitations due to deficits listed below (See PT problem list). Pt needs skilled PT to maximize independence and safety. Prior to admission pt limited to short distance ambulation and used w/c if out of her room. Pt very weak and mobility limited to getting almost to EOB. Difficulty scooting on textured sheets. Expect progress to be slow. Patient will benefit from continued inpatient follow up therapy, <3 hours/day.           If plan is discharge home, recommend the following: Two people to help with walking and/or transfers;Two people to help with bathing/dressing/bathroom   Can travel by private vehicle   No    Equipment Recommendations None recommended by PT  Recommendations for Other Services       Functional Status Assessment Patient has had a recent decline in their functional status and/or demonstrates limited ability to make significant improvements in function in a reasonable and predictable amount of time     Precautions / Restrictions Precautions Precautions: Fall Restrictions Weight Bearing Restrictions: No      Mobility  Bed Mobility Overal bed mobility: Needs Assistance Bed Mobility: Supine to Sit, Sit to Supine     Supine to sit: Max assist, HOB elevated, Used rails Sit to supine: Mod assist, HOB elevated   General bed mobility comments: Assist to elevate trunk into sitting and bring hips to EOB. Assist to bring legs back up into  bed.    Transfers                   General transfer comment: Unable to attempt due to weakness and BLE pain    Ambulation/Gait                  Stairs            Wheelchair Mobility     Tilt Bed    Modified Rankin (Stroke Patients Only)       Balance Overall balance assessment: Needs assistance Sitting-balance support: Feet unsupported, Bilateral upper extremity supported Sitting balance-Leahy Scale: Poor Sitting balance - Comments: UE support. Unable to scoot far enough to EOB to get feet to floor due to textured sheets and bedpad.                                     Pertinent Vitals/Pain Pain Assessment Pain Assessment: Faces Faces Pain Scale: Hurts little more Pain Location: BLEs Pain Descriptors / Indicators: Grimacing, Guarding, Sore Pain Intervention(s): Limited activity within patient's tolerance, Repositioned, Monitored during session    Home Living Family/patient expects to be discharged to:: Skilled nursing facility ([facility])   Available Help at Discharge: Available 24 hours/day             Home Equipment: Rollator (4 wheels);Shower seat;Grab bars - toilet;Lift chair Additional Comments: Uses lift chair for sleeping and relaxing.  Elevates it for sit to stand.    Prior Function Prior Level of Function : Needs assist  Physical Assist : ADLs (physical)     Mobility Comments: Pt has rollator but would furniture walk in her room.  Does not walk outside her room, they transport her with the wheelchair. ADLs Comments: Had assist with showering but was able to dress herself.     Extremity/Trunk Assessment   Upper Extremity Assessment Upper Extremity Assessment: Defer to OT evaluation    Lower Extremity Assessment Lower Extremity Assessment: Generalized weakness       Communication   Communication Communication: No apparent difficulties  Cognition Arousal: Alert Behavior During Therapy: WFL  for tasks assessed/performed Overall Cognitive Status: Within Functional Limits for tasks assessed                                          General Comments      Exercises     Assessment/Plan    PT Assessment Patient needs continued PT services  PT Problem List Decreased strength;Decreased balance;Decreased mobility;Obesity;Decreased activity tolerance;Pain       PT Treatment Interventions DME instruction;Therapeutic activities;Gait training;Therapeutic exercise;Stair training;Balance training;Functional mobility training;Patient/family education    PT Goals (Current goals can be found in the Care Plan section)  Acute Rehab PT Goals Patient Stated Goal: get better PT Goal Formulation: With patient Time For Goal Achievement: 04/28/23 Potential to Achieve Goals: Fair    Frequency Min 1X/week     Co-evaluation               AM-PAC PT "6 Clicks" Mobility  Outcome Measure Help needed turning from your back to your side while in a flat bed without using bedrails?: A Lot Help needed moving from lying on your back to sitting on the side of a flat bed without using bedrails?: A Lot Help needed moving to and from a bed to a chair (including a wheelchair)?: Total Help needed standing up from a chair using your arms (e.g., wheelchair or bedside chair)?: Total Help needed to walk in hospital room?: Total Help needed climbing 3-5 steps with a railing? : Total 6 Click Score: 8    End of Session Equipment Utilized During Treatment: Oxygen Activity Tolerance: Patient limited by fatigue;Patient limited by pain Patient left: in bed;with call bell/phone within reach;with bed alarm set Nurse Communication: Mobility status (recommend dermatherapy sheets to decrease burden of care with bed mobility) PT Visit Diagnosis: Other abnormalities of gait and mobility (R26.89);Muscle weakness (generalized) (M62.81);Pain Pain - Right/Left:  (bilateral) Pain - part of body:  Ankle and joints of foot;Leg;Knee    Time: 1610-9604 PT Time Calculation (min) (ACUTE ONLY): 20 min   Charges:   PT Evaluation $PT Eval Moderate Complexity: 1 Mod             [facility] PT Acute Rehabilitation Services Office [phone]   Angelina Ok [facility] 04/14/2023, 11:17 AM

## 2023-04-15 DIAGNOSIS — I513 Intracardiac thrombosis, not elsewhere classified: Secondary | ICD-10-CM | POA: Diagnosis not present

## 2023-04-15 DIAGNOSIS — I5021 Acute systolic (congestive) heart failure: Secondary | ICD-10-CM | POA: Diagnosis not present

## 2023-04-15 DIAGNOSIS — I5043 Acute on chronic combined systolic (congestive) and diastolic (congestive) heart failure: Secondary | ICD-10-CM | POA: Diagnosis not present

## 2023-04-15 DIAGNOSIS — I272 Pulmonary hypertension, unspecified: Secondary | ICD-10-CM

## 2023-04-15 DIAGNOSIS — J189 Pneumonia, unspecified organism: Secondary | ICD-10-CM | POA: Diagnosis not present

## 2023-04-15 DIAGNOSIS — N179 Acute kidney failure, unspecified: Secondary | ICD-10-CM | POA: Diagnosis not present

## 2023-04-15 DIAGNOSIS — J9621 Acute and chronic respiratory failure with hypoxia: Secondary | ICD-10-CM | POA: Diagnosis not present

## 2023-04-15 DIAGNOSIS — I4892 Unspecified atrial flutter: Secondary | ICD-10-CM

## 2023-04-15 DIAGNOSIS — K5900 Constipation, unspecified: Secondary | ICD-10-CM

## 2023-04-15 LAB — TYPE AND SCREEN
ABO/RH(D): A POS
Antibody Screen: NEGATIVE
Unit division: 0

## 2023-04-15 LAB — BASIC METABOLIC PANEL
Anion gap: 15 (ref 5–15)
BUN: 34 mg/dL — ABNORMAL HIGH (ref 8–23)
CO2: 36 mmol/L — ABNORMAL HIGH (ref 22–32)
Calcium: 8.7 mg/dL — ABNORMAL LOW (ref 8.9–10.3)
Chloride: 87 mmol/L — ABNORMAL LOW (ref 98–111)
Creatinine, Ser: 2.17 mg/dL — ABNORMAL HIGH (ref 0.44–1.00)
GFR, Estimated: 23 mL/min — ABNORMAL LOW (ref >60)
Glucose, Bld: 205 mg/dL — ABNORMAL HIGH (ref 70–99)
Potassium: 3.5 mmol/L (ref 3.5–5.1)
Sodium: 138 mmol/L (ref 135–145)

## 2023-04-15 LAB — BPAM RBC
Blood Product Expiration Date: 202412012359
ISSUE DATE / TIME: 202412011534
Unit Type and Rh: 600

## 2023-04-15 LAB — CULTURE, BLOOD (ROUTINE X 2)
Culture: NO GROWTH
Culture: NO GROWTH

## 2023-04-15 LAB — MAGNESIUM: Magnesium: 2.1 mg/dL (ref 1.7–2.4)

## 2023-04-15 LAB — GLUCOSE, CAPILLARY
Glucose-Capillary: 186 mg/dL — ABNORMAL HIGH (ref 70–99)
Glucose-Capillary: 174 mg/dL — ABNORMAL HIGH (ref 70–99)
Glucose-Capillary: 173 mg/dL — ABNORMAL HIGH (ref 70–99)

## 2023-04-15 LAB — CBC
HCT: 28.8 % — ABNORMAL LOW (ref 36.0–46.0)
Hemoglobin: 9.1 g/dL — ABNORMAL LOW (ref 12.0–15.0)
MCH: 29.1 pg (ref 26.0–34.0)
MCHC: 31.6 g/dL (ref 30.0–36.0)
MCV: 92 fL (ref 80.0–100.0)
Platelets: 208 10*3/uL (ref 150–400)
RBC: 3.13 MIL/uL — ABNORMAL LOW (ref 3.87–5.11)
RDW: 15.4 % (ref 11.5–15.5)
WBC: 11.4 10*3/uL — ABNORMAL HIGH (ref 4.0–10.5)
nRBC: 0.4 % — ABNORMAL HIGH (ref 0.0–0.2)

## 2023-04-15 LAB — APTT
aPTT: 52 s — ABNORMAL HIGH (ref 24–36)
aPTT: 56 s — ABNORMAL HIGH (ref 24–36)
aPTT: 59 s — ABNORMAL HIGH (ref 24–36)

## 2023-04-15 LAB — HEPARIN LEVEL (UNFRACTIONATED): Heparin Unfractionated: 0.93 [IU]/mL — ABNORMAL HIGH (ref 0.30–0.70)

## 2023-04-15 MED ORDER — POTASSIUM CHLORIDE CRYS ER 20 MEQ PO TBCR
40.0000 meq | EXTENDED_RELEASE_TABLET | ORAL | Status: AC
  Administered 2023-04-15 (×2): 40 meq via ORAL
  Filled 2023-04-15 (×2): qty 2

## 2023-04-15 MED ORDER — LINEZOLID 600 MG PO TABS
600.0000 mg | ORAL_TABLET | Freq: Two times a day (BID) | ORAL | Status: AC
  Administered 2023-04-15 – 2023-04-18 (×7): 600 mg via ORAL
  Filled 2023-04-15 (×7): qty 1

## 2023-04-15 NOTE — Progress Notes (Signed)
Rounding Note    Patient Name: Tonya Glass Date of Encounter: 04/15/2023  [facility] Cardiologist: Chrystie Nose, MD   Subjective   Tired this AM. Reports that she cannot lie flat at baseline, cannot lie more than about 45 degrees currently. Discussed cath in general but she is not ready yet, see below.  Inpatient Medications    Scheduled Meds:  atorvastatin  40 mg Oral Daily   carvedilol  3.125 mg Oral BID WC   Chlorhexidine Gluconate Cloth  6 each Topical Q0600   ferrous sulfate  325 mg Oral Q1200   FLUoxetine  40 mg Oral Daily   gabapentin  600 mg Oral QHS   hydrALAZINE  25 mg Oral Q8H   insulin aspart  0-9 Units Subcutaneous TID WC   isosorbide mononitrate  15 mg Oral Daily   levothyroxine  75 mcg Oral Daily   linezolid  600 mg Oral Q12H   mometasone-formoterol  2 puff Inhalation BID   multivitamin with minerals  1 tablet Oral Daily   mupirocin ointment  1 Application Nasal BID   pantoprazole  40 mg Oral BID   polyethylene glycol  17 g Oral BID   potassium chloride  40 mEq Oral Q4H   senna  1 tablet Oral Daily   sodium chloride flush  3 mL Intravenous Q12H   sucralfate  1 g Oral TID WC & HS   traZODone  25 mg Oral QHS   umeclidinium bromide  1 puff Inhalation Daily   Continuous Infusions:  ceFEPime (MAXIPIME) IV 2 g (04/14/23 1311)   heparin 1,000 Units/hr (04/14/23 2123)   PRN Meds: acetaminophen **OR** acetaminophen, ipratropium-albuterol, sodium chloride flush   Vital Signs    Vitals:   04/15/23 0734 04/15/23 0808 04/15/23 0813 04/15/23 1104  BP: (!) 127/41  (!) 127/41 (!) 141/49  Pulse: 82 74 74 77  Resp: 19   17  Temp: 98.1 F (36.7 C)   98.4 F (36.9 C)  TempSrc: Oral   Oral  SpO2: 94% 92%  96%  Weight:      Height:        Intake/Output Summary (Last 24 hours) at 04/15/2023 1109 Last data filed at 04/15/2023 0736 Gross per 24 hour  Intake 1452.87 ml  Output 1450 ml  Net 2.87 ml      04/15/2023    4:34 AM 04/14/2023     4:22 AM 04/13/2023    4:11 AM  Last 3 Weights  Weight (lbs) 208 lb 12.4 oz 217 lb 13 oz 218 lb 11.1 oz  Weight (kg) 94.7 kg 98.8 kg 99.2 kg      Telemetry    V paced - Personally Reviewed  Physical Exam   GEN: No acute distress.  O2 by [place] in place. Neck: No JVD at 45 degrees  Cardiac: RRR, no murmurs, rubs, or gallops.  Respiratory: slightly diminished at bilateral bases, diffusely coarse GI: Soft, nontender, non-distended  MS: bilateral LE wrapped with ace bandages, no significant pitting edema appreciated Neuro:  Nonfocal  Psych: Normal affect   New pertinent results (labs, ECG, imaging, cardiac studies)    TTE 04/11/23 reviewed  Patient Profile     77 y.o. female with PMH nonobstructive CAD, surgical AVR 2014 (23 mm perimount magna ease) with aortic root repair, complete heart block s/p DC PPM, pulmonary hypertension, CKD stage 3b, type II diabetes, hypertension who presented   Assessment & Plan    Acute on chronic systolic and diastolic heart  failure Acute on chronic hypoxemic and hypercarbic respiratory failure Type II diabetes Hypertension -EF 35-40% with regional wall motion abnormalities in the septum. Normal RV, RVSP 19 mmhg, RAP 3 mmHg -with normal RA and RV pressures, does not appear that she has significant extra volume intravascularly. Will aim for afterload reduction, on hydralazine and imdur. Stopping lasix drip today given rising Cr. Allow for vascular equilibration, reassess filling pressures on cath -plan is for cath once respiratory status and renal function improve, neither are ready for cath today. Previously had nonobstructive CAD. Would pursue R and LHC when able -baseline uses 4L [place] O2 at home. -with above pressures, multifocal opacities on CXR, and O2 requirement, agree with primary team's treatment for pneumonia -suggests significant LE edema is likely not cardiac in etiology, likely venous insufficiency. Has history of stasis ulcers as well  LV  apical thrombus Paroxysmal atrial flutter -on heparin drip, plan for DOAC post cath -CHA2DS2/VAS Stroke Risk Points=9  -cautious given anemia , s/p transfusion 04/14/23  Chronic kidney disease stage 4 -Cr continues to rise, 2.17 today, nadir 1.83 -with RAP and RV pressure normal on echo, concern would be for overdiuresis. Will focus on afterload reduction. Stop lasix drip. Continue imdur, hydralazine given renal function  Pulmonary hypertension, WHO group 3 COPD  OSA -RHC as above when stable, though echo pressures were normal  Bioprosthetic aortic valve with paravalvular leak -stable, mean gradient 19 mmHg  Complete heart block s/p DC PPM -v paced 98% of the time. This may be the etiology of her cardiomyopathy. Would consider CDT upgrade   High complexity medical decision making given multiple high risk comorbidities    Signed, Jodelle Red, MD  04/15/2023, 11:09 AM

## 2023-04-15 NOTE — Assessment & Plan Note (Addendum)
Currently diuresing on Lasix drip, appears nearly euvolemic on exam with minimal JVD.  Total 8L diuresis since admission, suspect UOP not accurate last 24 hours. -Daily weights, strict I&Os -PT OT evaluate and treat -Out of bed order with meals -Consult RD given poor p.o. intake and nutrition -Cardiology following, recs appreciated   -Continue Lasix drip -Hydralazine 25 mg TID, Imdur 15 mg daily, carvedilol 3.125 mg BID added -L/R heart cath when creatinine improves, possibly tomorrow

## 2023-04-15 NOTE — Assessment & Plan Note (Signed)
Possible HAP given CXR and increased oxygen requirement.  On 2 L O2 this morning, intermittently requiring more; appears to require 4 L with exertion at baseline but none when at rest normally. -Start linezolid 600 mg every 12 hours for possible HAP

## 2023-04-15 NOTE — Assessment & Plan Note (Signed)
Had BM 12/1. -Continue bowel regimen with MiraLAX and senna

## 2023-04-15 NOTE — Care Management Important Message (Signed)
Important Message  Patient Details  Name: Tonya Glass MRN: [id number] Date of Birth: 1945/06/13   Important Message Given:  Yes - Medicare IM     Dorena Bodo 04/15/2023, 3:37 PM

## 2023-04-15 NOTE — Progress Notes (Signed)
Able to wean pt to 1L O2 via nasal cannula, SpO2 90-94 %, no increased work of breathing.

## 2023-04-15 NOTE — Plan of Care (Signed)

## 2023-04-15 NOTE — Progress Notes (Addendum)
Daily Progress Note Intern Pager: [phone]  Patient name: Tonya Glass Medical record number: [id number] Date of birth: [birth year] Age: 77 y.o. Gender: female  Primary Care Provider: No primary care provider on file. Consultants: Cardiology Code Status: DNR/DNI  Pt Overview and Major Events to Date:  11/27 - Admitted 12/1 - Hemoglobin <8, transfused 1 unit PRBC, Cr trending up   Assessment and Plan: Tonya Glass is a 77 y.o. female with a pertinent PMH of HFpEF, HLD, aortic stenosis s/p valve replacement, sick sinus syndrome, hypothyroidism, HTN, CKD, COPD on home oxygen 4L, and T2DM who presented with dyspnea and was admitted for CHF exacerbation.  Diuresis continuing per cardiology.  Given patient's continuing increased O2 requirement and concerning CXR 11/29, will treat for possible HAP at this time.  Remains afebrile and low concern for endocarditis.  Of note, creatinine continues to trend up which may be due to Lasix drip but will also order nearly bladder scans to rule out postobstructive uropathy.  Will need creatinine to improve for L heart cath per Cardiology.  Did require 1 unit of PRBC yesterday for downtrending hemoglobin, no concern for acute bleed and appropriate for follow-up outpatient with GI for possible colonoscopy. Assessment & Plan Acute exacerbation of CHF (congestive heart failure) (HCC) Currently diuresing on Lasix drip, appears nearly euvolemic on exam with minimal JVD.  Total 8L diuresis since admission, suspect UOP not accurate last 24 hours. -Daily weights, strict I&Os -PT OT evaluate and treat -Out of bed order with meals -Consult RD given poor p.o. intake and nutrition -Cardiology following, recs appreciated   -Continue Lasix drip -Hydralazine 25 mg TID, Imdur 15 mg daily, carvedilol 3.125 mg BID added -L/R heart cath when creatinine improves, possibly tomorrow Acute on chronic hypoxic respiratory failure (HCC) Possible HAP given CXR and  increased oxygen requirement.  On 2 L O2 this morning, intermittently requiring more; appears to require 4 L with exertion at baseline but none when at rest normally. -Start linezolid 600 mg every 12 hours for possible HAP Acute kidney injury superimposed on stage 3b chronic kidney disease (HCC) Cr up to 2.17, baseline ~1.5-1.7.  Likely continues to increase in setting of Lasix diuresis.  Would require improvement in Cr prior to heart cath procedure. -Hold Jardiance and irbesartan for now -Daily bladder scan to ensure patient is not retaining and creatinine is not increasing because of postobstructive uropathy -Diuresis as above -Daily BMP Anemia Hgb 9.1 s/p 1 unit PRBC yesterday and APTT still subtherapeutic.  No sign of acute bleeding, suspected to be secondary to anemia of chronic disease given ferritin WNL, though etiology not fully clear and will obtain outpatient colonoscopy.  Is already on Carafate TID. -Continue home iron 325 mg daily -Transfuse <8.0 Hgb given she has has had significant UOP with diuresis -Trend daily hemoglobin on CBC LV thrombus Seen on echo.  Blood CX NGTD at 4d with fever to 100.5 once on 11/29, but continues to remain afebrile.  Lower concern for endocarditis at this time. -Monitor fever curve, discontinued vancomycin -Continue heparin drip -Cardiology planning for L/R heart cath to evaluate for coronary artery embolism when creatinine is improved COPD (chronic obstructive pulmonary disease) (HCC) Suspect CHF is driving patient's current dyspnea.  Respiratory status has improved, has not required DuoNebs and no longer on HFNC.  Discussed importance of adherence to BiPAP overnight, will motivate patient at night shift. -Wean O2 as able with goal O2 88-92%, home baseline 4 L with exertion -DuoNebs every 4 hours  PRN -Continue Dulera 2 puffs BID (on Symbicort formulary equivalent) -Continue guaifenesin 600 mg BID -Nightly BiPAP, patient continues to be  nonadherent DM2 (diabetes mellitus, type 2) (HCC) A1c 6.0.  CBG in last 24 hours have remained appropriate.  7 units SAI in past 24 hours. 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 -Hold home Eliquis 5mg  BID -Currently on heparin drip for LV apical thrombus -AM BMP; goal K>4, mag>2 Constipation Had BM 12/1. -Continue bowel regimen with MiraLAX and senna  Chronic and Stable Problems: Hypothyroidism: Continue levothyroxine 75 mcg daily Depression: Continue fluoxetine 40 mg daily, decreased trazodone to 25 mg nightly given somnolence Peripheral neuropathy: Continue gabapentin 600 mg nightly Osteoporosis: On alendronate 70 mg every Sunday HLD: Continue home atorvastatin 40 mg daily HTN: Continue clonidine 0.2 mg nightly, irbesartan 150 mg daily (olmesartan 20 mg formulary equivalent), hold carvedilol 3.125 mg BID Wounds to right leg: Wound care following continue compression wraps bilateral legs GERD: Continue home carafate 1g TID, pantoprazole 40mg  PO BID  FEN/GI: Heart healthy diet PPx: Heparin Dispo:Home pending clinical improvement . Barriers include needing continued diuresis.  Subjective: This morning, patient states that she is feeling well.  Denies shortness of breath.  Clarifies that she uses oxygen at home only with exertion.  States that she has not been eating well due to poor appetite.  Objective: Temp:  [97.7 F (36.5 C)-98.8 F (37.1 C)] 98.1 F (36.7 C) (12/02 0734) Pulse Rate:  [74-90] 74 (12/02 0813) Resp:  [14-19] 19 (12/02 0734) BP: (98-153)/(39-59) 127/41 (12/02 0813) SpO2:  [88 %-100 %] 92 % (12/02 0808) Weight:  [94.7 kg] 94.7 kg (12/02 0434)  Physical Exam: General: Age-appropriate, resting comfortably in chair, NAD, WNWD, alert and at baseline. HEENT: MMM. Cardiovascular: JVD just above  clavicle.  Minimal peripheral edema.  Regular rate and rhythm. Normal S1/S2. No murmurs, rubs, or gallops appreciated. 2+ radial pulses. Pulmonary: Clear bilaterally to ascultation. No increased WOB, no accessory muscle usage on 2 L O2 [place].  No wheezes, rales, or crackles, but difficult to auscultate due to patient habitus. Abdominal: Normoactive bowel sounds, nondistended. No tenderness to deep or light palpation. No rebound or guarding. Extremities: 1+ pitting edema at ankles bilaterally.  Capillary refill <2 seconds.  Laboratory: Most recent CBC Lab Results  Component Value Date   WBC 11.4 (H) 04/15/2023   HGB 9.1 (L) 04/15/2023   HCT 28.8 (L) 04/15/2023   MCV 92.0 04/15/2023   PLT 208 04/15/2023   Most recent BMP    Latest Ref Rng & Units 04/15/2023    6:55 AM  BMP  Glucose 70 - 99 mg/dL 132   BUN 8 - 23 mg/dL 34   Creatinine 4.40 - 1.00 mg/dL 1.02   Sodium 725 - 366 mmol/L 138   Potassium 3.5 - 5.1 mmol/L 3.5   Chloride 98 - 111 mmol/L 87   CO2 22 - 32 mmol/L 36   Calcium 8.9 - 10.3 mg/dL 8.7     Other pertinent labs: -None  New Imaging/Diagnostic Tests: -None  Znya Albino, MD 04/15/2023, 8:16 AM  PGY-1, [facility] FPTS Intern pager: [phone], text pages welcome Secure chat group [facility] Teaching Service

## 2023-04-15 NOTE — Assessment & Plan Note (Signed)
LV apical thrombus noted on TTE. -Hold home Eliquis 5mg  BID -Currently on heparin drip for LV apical thrombus -AM BMP; goal K>4, mag>2

## 2023-04-15 NOTE — Assessment & Plan Note (Addendum)
Suspect CHF is driving patient's current dyspnea.  Respiratory status has improved, has not required DuoNebs and no longer on HFNC.  Discussed importance of adherence to BiPAP overnight, will motivate patient at night shift. -Wean O2 as able with goal O2 88-92%, home baseline 4 L with exertion -DuoNebs every 4 hours PRN -Continue Dulera 2 puffs BID (on Symbicort formulary equivalent) -Continue guaifenesin 600 mg BID -Nightly BiPAP, patient continues to be nonadherent

## 2023-04-15 NOTE — Assessment & Plan Note (Addendum)
Seen on echo.  Blood CX NGTD at 4d with fever to 100.5 once on 11/29, but continues to remain afebrile.  Lower concern for endocarditis at this time. -Monitor fever curve, discontinued vancomycin -Continue heparin drip -Cardiology planning for L/R heart cath to evaluate for coronary artery embolism when creatinine is improved

## 2023-04-15 NOTE — Progress Notes (Signed)
PHARMACY - ANTICOAGULATION CONSULT NOTE  Pharmacy Consult for heparin Indication:  LV thrombus  No Known Allergies  Patient Measurements: Height: 5' (152.4 cm) Weight: 94.7 kg (208 lb 12.4 oz) IBW/kg (Calculated) : 45.5 Heparin Dosing Weight: 72kg  Vital Signs: Temp: 98.3 F (36.8 C) (12/02 1948) Temp Source: Oral (12/02 1948) BP: 115/47 (12/02 1948) Pulse Rate: 72 (12/02 2011)  Labs: Recent Labs    04/13/23 0238 04/13/23 1830 04/13/23 2006 04/14/23 0507 04/14/23 2007 04/15/23 0655 04/15/23 1605 04/15/23 2044  HGB 8.4*  --   --  7.5* 8.3* 9.1*  --   --   HCT 27.6*  --   --  25.1* 26.5* 28.8*  --   --   PLT 179  --   --  182  --  208  --   --   APTT 58*   < >  --  84* 95* 52* 56* 59*  HEPARINUNFRC >1.10*  --   --  >1.10*  --  0.93*  --   --   CREATININE 2.15*  --  1.82* 2.01*  --  2.17*  --   --    < > = values in this interval not displayed.    Estimated Creatinine Clearance: 22.3 mL/min (A) (by C-G formula based on SCr of 2.17 mg/dL (H)).  Assessment: 28 yoF admitted with CHF and anemia. Pt with hx AF on apixaban PTA which was held for anemia 11/27, ECHO 11/28 with LV thrombus, pharmacy consulted to dose IV heparin.  Heparin level remains falsely elevated from recent DOAC use, but trending down. aPTT remains subtherapeutic (59 sec) on infusion at 1100 units/hr. No issues with line or bleeding reported per RN.  Goal of Therapy:  Heparin level 0.3-0.5 units/ml aPTT 66-85 seconds Monitor platelets by anticoagulation protocol: Yes   Plan:  Increase heparin infusion to 1200 units/hr Check aPTT and heparin level in 8 hours  Thank you for involving pharmacy in this patient's care.  Christoper Fabian, PharmD, BCPS Please see amion for complete clinical pharmacist phone list 04/15/2023 9:49 PM

## 2023-04-15 NOTE — Assessment & Plan Note (Signed)
A1c 6.0.  CBG in last 24 hours have remained appropriate.  7 units SAI in past 24 hours. -Continue sensitive SSI -Dose long-acting insulin if needed -Hold home Jardiance 10 mg in setting of AKI -Hold home glipizide 10 mg BID

## 2023-04-15 NOTE — Assessment & Plan Note (Addendum)
Cr up to 2.17, baseline ~1.5-1.7.  Likely continues to increase in setting of Lasix diuresis.  Would require improvement in Cr prior to heart cath procedure. -Hold Jardiance and irbesartan for now -Daily bladder scan to ensure patient is not retaining and creatinine is not increasing because of postobstructive uropathy -Diuresis as above -Daily BMP

## 2023-04-15 NOTE — Progress Notes (Signed)
Heart Failure Navigator Progress Note  Assessed for Heart & Vascular TOC clinic readiness.  Patient EF 35-40%, has a scheduled [facility] appointment on 05/03/23.   Navigator will sign off at this time.   Rhae Hammock, BSN, Scientist, clinical (histocompatibility and immunogenetics) Only

## 2023-04-15 NOTE — Progress Notes (Signed)
PHARMACY - ANTICOAGULATION CONSULT NOTE  Pharmacy Consult for heparin Indication:  LV thrombus  No Known Allergies  Patient Measurements: Height: 5' (152.4 cm) Weight: 94.7 kg (208 lb 12.4 oz) IBW/kg (Calculated) : 45.5 Heparin Dosing Weight: 72kg  Vital Signs: Temp: 98.1 F (36.7 C) (12/02 0734) Temp Source: Oral (12/02 0734) BP: 127/41 (12/02 0734) Pulse Rate: 82 (12/02 0734)  Labs: Recent Labs    04/12/23 0926 04/12/23 0926 04/13/23 0238 04/13/23 1830 04/13/23 2006 04/14/23 0507 04/14/23 2007 04/15/23 0655  HGB  --    < > 8.4*  --   --  7.5* 8.3* 9.1*  HCT  --    < > 27.6*  --   --  25.1* 26.5* 28.8*  PLT  --   --  179  --   --  182  --  208  APTT 88*  --  58* 62*  --  84* 95*  --   HEPARINUNFRC >1.10*  --  >1.10*  --   --  >1.10*  --   --   CREATININE  --   --  2.15*  --  1.82* 2.01*  --   --    < > = values in this interval not displayed.    Estimated Creatinine Clearance: 24.1 mL/min (A) (by C-G formula based on SCr of 2.01 mg/dL (H)).  Assessment: 33 yoF admitted with CHF and anemia. Pt with hx AF on apixaban PTA which was held for anemia 11/27, ECHO 11/28 with LV thrombus, pharmacy consulted to dose IV heparin.  12/2: Heparin level remains falsely elevated from recent DOAC use, but trending down. aPTT is subtherapeutic at 52s on 1000 units/hr. CBC improved following PRBC transfusion. No overt bleeding or complications noted, no problems with the infusion per RN. Will continue to dose based on APTT until levels correlate. Will use a slightly lower maintenance dose titration as patient was previously supratherapeutic on 1150 units/hour.   Goal of Therapy:  Heparin level 0.3-0.5 units/ml aPTT 66-85 seconds Monitor platelets by anticoagulation protocol: Yes   Plan:  Increase heparin infusion to 1100 units/hr Check aPTT in 8 hours Monitor daily heparin level, aPTT, and CBC Monitor for signs/symptoms bleeding Follow up transition to oral  anticoagulation  Thank you for involving pharmacy in this patient's care.  Jani Gravel, PharmD Clinical Pharmacist  04/15/2023 7:40 AM

## 2023-04-15 NOTE — Assessment & Plan Note (Addendum)
Hgb 9.1 s/p 1 unit PRBC yesterday and APTT still subtherapeutic.  No sign of acute bleeding, suspected to be secondary to anemia of chronic disease given ferritin WNL, though etiology not fully clear and will obtain outpatient colonoscopy.  Is already on Carafate TID. -Continue home iron 325 mg daily -Transfuse <8.0 Hgb given she has has had significant UOP with diuresis -Trend daily hemoglobin on CBC

## 2023-04-16 ENCOUNTER — Inpatient Hospital Stay (HOSPITAL_COMMUNITY): Payer: 59

## 2023-04-16 DIAGNOSIS — N184 Chronic kidney disease, stage 4 (severe): Secondary | ICD-10-CM

## 2023-04-16 DIAGNOSIS — Z953 Presence of xenogenic heart valve: Secondary | ICD-10-CM | POA: Diagnosis not present

## 2023-04-16 DIAGNOSIS — I5043 Acute on chronic combined systolic (congestive) and diastolic (congestive) heart failure: Secondary | ICD-10-CM | POA: Diagnosis not present

## 2023-04-16 DIAGNOSIS — I272 Pulmonary hypertension, unspecified: Secondary | ICD-10-CM | POA: Diagnosis not present

## 2023-04-16 DIAGNOSIS — J189 Pneumonia, unspecified organism: Secondary | ICD-10-CM | POA: Diagnosis not present

## 2023-04-16 DIAGNOSIS — I513 Intracardiac thrombosis, not elsewhere classified: Secondary | ICD-10-CM | POA: Diagnosis not present

## 2023-04-16 DIAGNOSIS — I4892 Unspecified atrial flutter: Secondary | ICD-10-CM | POA: Diagnosis not present

## 2023-04-16 LAB — BASIC METABOLIC PANEL
Anion gap: 15 (ref 5–15)
BUN: 41 mg/dL — ABNORMAL HIGH (ref 8–23)
CO2: 33 mmol/L — ABNORMAL HIGH (ref 22–32)
Calcium: 8.9 mg/dL (ref 8.9–10.3)
Chloride: 91 mmol/L — ABNORMAL LOW (ref 98–111)
Creatinine, Ser: 2.26 mg/dL — ABNORMAL HIGH (ref 0.44–1.00)
GFR, Estimated: 22 mL/min — ABNORMAL LOW (ref >60)
Glucose, Bld: 193 mg/dL — ABNORMAL HIGH (ref 70–99)
Potassium: 3.9 mmol/L (ref 3.5–5.1)
Sodium: 139 mmol/L (ref 135–145)

## 2023-04-16 LAB — CBC
HCT: 31.3 % — ABNORMAL LOW (ref 36.0–46.0)
Hemoglobin: 9.7 g/dL — ABNORMAL LOW (ref 12.0–15.0)
MCH: 29 pg (ref 26.0–34.0)
MCHC: 31 g/dL (ref 30.0–36.0)
MCV: 93.7 fL (ref 80.0–100.0)
Platelets: 259 10*3/uL (ref 150–400)
RBC: 3.34 MIL/uL — ABNORMAL LOW (ref 3.87–5.11)
RDW: 15 % (ref 11.5–15.5)
WBC: 9.7 10*3/uL (ref 4.0–10.5)
nRBC: 0.3 % — ABNORMAL HIGH (ref 0.0–0.2)

## 2023-04-16 LAB — APTT
aPTT: 67 s — ABNORMAL HIGH (ref 24–36)
aPTT: 60 s — ABNORMAL HIGH (ref 24–36)

## 2023-04-16 LAB — MAGNESIUM: Magnesium: 2.2 mg/dL (ref 1.7–2.4)

## 2023-04-16 LAB — HEPARIN LEVEL (UNFRACTIONATED)
Heparin Unfractionated: 0.72 [IU]/mL — ABNORMAL HIGH (ref 0.30–0.70)
Heparin Unfractionated: 0.73 [IU]/mL — ABNORMAL HIGH (ref 0.30–0.70)

## 2023-04-16 LAB — GLUCOSE, CAPILLARY
Glucose-Capillary: 190 mg/dL — ABNORMAL HIGH (ref 70–99)
Glucose-Capillary: 174 mg/dL — ABNORMAL HIGH (ref 70–99)
Glucose-Capillary: 216 mg/dL — ABNORMAL HIGH (ref 70–99)

## 2023-04-16 MED ORDER — POTASSIUM CHLORIDE 20 MEQ PO PACK
20.0000 meq | PACK | Freq: Once | ORAL | Status: AC
  Administered 2023-04-16: 20 meq via ORAL
  Filled 2023-04-16: qty 1

## 2023-04-16 NOTE — Assessment & Plan Note (Deleted)
Possible HAP given CXR and increased oxygen requirement.  On 2 L O2 this morning, intermittently requiring more; appears to require 4 L with exertion at baseline but none when at rest normally. -Start linezolid 600 mg every 12 hours for possible HAP

## 2023-04-16 NOTE — Assessment & Plan Note (Addendum)
Cr up to 2.26, former baseline ~1.5-1.7.  Elevation thought to be in setting of Lasix diuresis, but Lasix has been stopped.  Bladder scans normal.  No clear etiology for creatinine rise, but this may be new baseline.  Per Cardiology, would require improvement in Cr prior to heart cath procedure. -Hold Jardiance and irbesartan for now -Daily bladder scan to ensure patient is not retaining and creatinine is not increasing because of postobstructive uropathy -Renal US to evaluate for hydronephrosis, other pathology -Daily BMP

## 2023-04-16 NOTE — Progress Notes (Signed)
Initial Nutrition Assessment  DOCUMENTATION CODES:   Obesity unspecified  INTERVENTION:  Change diet to dysphagia 3 for ease of chewing MVI with minerals daily Encourage family/visitors to bring foods desired from outside hospital when visiting  NUTRITION DIAGNOSIS:   Inadequate oral intake related to decreased appetite as evidenced by per patient/family report.  GOAL:   Patient will meet greater than or equal to 90% of their needs  MONITOR:   PO intake, Labs, Weight trends, Skin  REASON FOR ASSESSMENT:   Consult Poor PO  ASSESSMENT:   Pt admitted with c/o dyspnea d/t acute CHF exacerbation. PMH significant for HFpEF, HLD, AS s/p valve replacement, SSS, hypothyroidism, HTN, CKD, COPD, T2DM.   Cardiology planning for heart cath once creatinine improved. Likely tomorrow.   Pt sitting up in bedside recliner, doing breathing treatment at time of visit.  She reports that PTA she was eating at her baseline. She does not often eat breakfast but will occasionally fry an egg. Lunch she will use her airfryer and make chicken wings or frozen sausage. Dinner she reports that she "picks at" but does not usually eat a big meal.   Pt states that she does not like hospital food and will just pick at it. She has a difficult time with the protein in the meals offered here. She has previously tried protein supplements and refuses them as she does not like the taste. Offered alternatives but pt declines. Provided snack options to offer between meals, pt also declines snack options. Encouraged pt to have family bring food when visit but she reports that her daughter just left town for the next 3 days.   Meal completions: 11/30: 45% breakfast 12/1: 0% x 3 recorded meals 12/2: 15% breakfast, 20% lunch, 50% dinner  Pt reports intermittent difficulty with chewing. Pt wears dentures. She is agreeable to trying chopped foods to see if this helps with PO intake.   Pt is uncertain of her usual  weight but feels that her clothes have been fitting the same.  Admit weight: 109.8 kg Current weight: 95.7 kg Limited prior weight history on file to review. Pt's weight in February documented at 95.1 kg. Suspect this is closer to pt's dry weight, though dry weight is unknown.  + mild generalized, BLE  Medications: ferrous sulfate, SSI 0-9 units TID, linezolid, MVI, protonix, miralax, senna daily, carafate, IV abx  Labs: BUN 41, Cr 2.26, GFR 22, CBG's 173-190 x24 hours  NUTRITION - FOCUSED PHYSICAL EXAM:  Flowsheet Row Most Recent Value  Orbital Region No depletion  Upper Arm Region No depletion  Thoracic and Lumbar Region No depletion  Buccal Region No depletion  Temple Region Mild depletion  Clavicle Bone Region No depletion  Clavicle and Acromion Bone Region No depletion  Scapular Bone Region No depletion  Dorsal Hand Mild depletion  Patellar Region Unable to assess  Anterior Thigh Region Unable to assess  Posterior Calf Region Severe depletion  [weaping skin]  Edema (RD Assessment) Mild  [BLE]  Hair Reviewed  Eyes Reviewed  Mouth Other (Comment)  [edentulous]  Skin Reviewed  Nails Reviewed       Diet Order:   Diet Order             Diet NPO time specified  Diet effective midnight           DIET DYS 3 Room service appropriate? Yes with Assist; Fluid consistency: Thin  Diet effective now  EDUCATION NEEDS:   Education needs have been addressed  Skin:  Skin Assessment: Skin Integrity Issues: Skin Integrity Issues:: Stage II Stage II: sacrum  Last BM:  12/1  Height:   Ht Readings from Last 1 Encounters:  04/10/23 5' (1.524 m)    Weight:   Wt Readings from Last 1 Encounters:  04/16/23 95.7 kg    Ideal Body Weight:  45.5 kg  BMI:  Body mass index is 41.2 kg/m.  Estimated Nutritional Needs:   Kcal:  1300-1500  Protein:  65-80g  Fluid:  >/=1.5L  Drusilla Kanner, RDN, LDN Clinical Nutrition

## 2023-04-16 NOTE — Progress Notes (Signed)
PHARMACY - ANTICOAGULATION CONSULT NOTE  Pharmacy Consult for heparin Indication:  LV thrombus  No Known Allergies  Patient Measurements: Height: 5' (152.4 cm) Weight: 95.7 kg (210 lb 15.7 oz) IBW/kg (Calculated) : 45.5 Heparin Dosing Weight: 72kg  Vital Signs: Temp: 97.4 F (36.3 C) (12/03 1649) Temp Source: Oral (12/03 1649) BP: 130/61 (12/03 1649) Pulse Rate: 72 (12/03 1649)  Labs: Recent Labs    04/14/23 0507 04/14/23 2007 04/15/23 0655 04/15/23 1605 04/15/23 2044 04/16/23 0650 04/16/23 1518  HGB 7.5* 8.3* 9.1*  --   --  9.7*  --   HCT 25.1* 26.5* 28.8*  --   --  31.3*  --   PLT 182  --  208  --   --  259  --   APTT 84* 95* 52*   < > 59* 67* 60*  HEPARINUNFRC >1.10*  --  0.93*  --   --  0.72* 0.73*  CREATININE 2.01*  --  2.17*  --   --  2.26*  --    < > = values in this interval not displayed.    Estimated Creatinine Clearance: 21.6 mL/min (A) (by C-G formula based on SCr of 2.26 mg/dL (H)).  Assessment: 40 yoF admitted with CHF and anemia. Pt with hx AF on apixaban PTA which was held for anemia 11/27, ECHO 11/28 with LV thrombus, pharmacy consulted to dose IV heparin.  12/3: Heparin level remains falsely elevated from recent DOAC use, but trending down. aPTT is therapeutic at 67s on 1200 units/hr. CBC improved following PRBC transfusion. No overt bleeding or complications noted, no problems with the infusion per RN. Will continue to dose based on APTT until levels correlate.   12/3 PM: Heparin level 0.73, remains falsely elevated in the setting of recent DOAC use. aPTT 60, subtherapeutic. No issues with heparin infusion running or signs of bleeding noted per RN.   Goal of Therapy:  Heparin level 0.3-0.5 units/ml aPTT 66-85 seconds Monitor platelets by anticoagulation protocol: Yes   Plan:  Increase heparin infusion to 1250 units/hr - slight increase due to recent supra-therapeutic levels Check aPTT/HL in 8 hours Monitor daily heparin level, aPTT, and  CBC Monitor for signs/symptoms bleeding Follow up transition to oral anticoagulation  Thank you for involving pharmacy in this patient's care.  Enos Fling, PharmD PGY-1 Acute Care Pharmacy Resident 04/16/2023 6:22 PM

## 2023-04-16 NOTE — Assessment & Plan Note (Addendum)
Hgb 9.1>9.7.  1 unit PRBC on 12/1.  No sign of acute bleeding, suspected to be secondary to anemia of chronic disease given ferritin WNL, though etiology not fully clear and will obtain outpatient colonoscopy.  Is already on Carafate TID. -Continue home iron 325 mg daily -Transfuse <8.0 Hgb given she has has had significant UOP with diuresis -Trend daily hemoglobin on CBC

## 2023-04-16 NOTE — Progress Notes (Signed)
PHARMACY - ANTICOAGULATION CONSULT NOTE  Pharmacy Consult for heparin Indication:  LV thrombus  No Known Allergies  Patient Measurements: Height: 5' (152.4 cm) Weight: 95.7 kg (210 lb 15.7 oz) IBW/kg (Calculated) : 45.5 Heparin Dosing Weight: 72kg  Vital Signs: Temp: 97.7 F (36.5 C) (12/03 0437) Temp Source: Axillary (12/03 0437) BP: 131/52 (12/03 0437) Pulse Rate: 71 (12/03 0437)  Labs: Recent Labs    04/13/23 1830 04/13/23 2006 04/14/23 0507 04/14/23 2007 04/15/23 0655 04/15/23 1605 04/15/23 2044 04/16/23 0650  HGB   < >  --  7.5* 8.3* 9.1*  --   --  9.7*  HCT   < >  --  25.1* 26.5* 28.8*  --   --  31.3*  PLT  --   --  182  --  208  --   --  259  APTT  --   --  84* 95* 52* 56* 59* 67*  HEPARINUNFRC  --   --  >1.10*  --  0.93*  --   --  0.72*  CREATININE  --  1.82* 2.01*  --  2.17*  --   --   --    < > = values in this interval not displayed.    Estimated Creatinine Clearance: 22.5 mL/min (A) (by C-G formula based on SCr of 2.17 mg/dL (H)).  Assessment: 61 yoF admitted with CHF and anemia. Pt with hx AF on apixaban PTA which was held for anemia 11/27, ECHO 11/28 with LV thrombus, pharmacy consulted to dose IV heparin.  12/3: Heparin level remains falsely elevated from recent DOAC use, but trending down. aPTT is therapeutic at 67s on 1200 units/hr. CBC improved following PRBC transfusion. No overt bleeding or complications noted, no problems with the infusion per RN. Will continue to dose based on APTT until levels correlate.   Goal of Therapy:  Heparin level 0.3-0.5 units/ml aPTT 66-85 seconds Monitor platelets by anticoagulation protocol: Yes   Plan:  Continue heparin infusion at 1200 units/hr Check aPTT/HL in 8 hours Monitor daily heparin level, aPTT, and CBC Monitor for signs/symptoms bleeding Follow up transition to oral anticoagulation  Thank you for involving pharmacy in this patient's care.  Jani Gravel, PharmD Clinical Pharmacist  04/16/2023  8:14 AM

## 2023-04-16 NOTE — Assessment & Plan Note (Addendum)
Cardiology holding Lasix drip and all other diuresis in setting of elevated creatinine with plan to do L/R heart cath as Cr stabilizes.  Appears nearly euvolemic on exam.  Net negative 8.5 L since admission. -Daily weights, strict I&Os -PT OT evaluate and treat -Out of bed order with meals -Consult RD given poor p.o. intake and nutrition, follow up recs -Cardiology following, recs appreciated   -Hold Lasix drip to allow for renovascular equilibration -Hydralazine 25 mg TID, Imdur 15 mg daily, carvedilol 3.125 mg BID -L/R heart cath when creatinine and respiratory function improve

## 2023-04-16 NOTE — Plan of Care (Addendum)
Observed patient today around her usual bedtime.  RT was placing BiPAP at that time.  RT also reports she has worn her BiPAP for the last 2 nights without complaint or issue.  Compliance is documented and BiPAP/CPAP tab under flowsheets.  Episodes are documented with vitals at time of placement.

## 2023-04-16 NOTE — Assessment & Plan Note (Signed)
A1c 6.0.  CBG in last 24 hours have remained appropriate.  7 units SAI in past 24 hours. -Continue sensitive SSI -Dose long-acting insulin if needed -Hold home Jardiance 10 mg in setting of AKI -Hold home glipizide 10 mg BID

## 2023-04-16 NOTE — Assessment & Plan Note (Signed)
Seen on echo.  Blood CX NGTD at 4d with fever to 100.5 once on 11/29, but continues to remain afebrile.  Lower concern for endocarditis at this time. -Monitor fever curve, discontinued vancomycin -Continue heparin drip -Cardiology planning for L/R heart cath to evaluate for coronary artery embolism when creatinine is improved

## 2023-04-16 NOTE — Assessment & Plan Note (Addendum)
LV apical thrombus noted on TTE. -Hold home Eliquis 5mg  BID -Currently on heparin drip for LV apical thrombus -AM BMP, mag; goal K>4, mag>2

## 2023-04-16 NOTE — Assessment & Plan Note (Addendum)
Last BM 12/1. -Continue bowel regimen with MiraLAX and senna

## 2023-04-16 NOTE — Progress Notes (Signed)
Daily Progress Note Intern Pager: [phone]  Patient name: Tonya Glass Medical record number: [id number] Date of birth: 1946/02/25 Age: 77 y.o. Gender: female  Primary Care Provider: No primary care provider on file. Consultants: Cardiology Code Status: DNR/DNI  Pt Overview and Major Events to Date:  11/27 - Admitted 12/1 - Hemoglobin <8, transfused 1 unit PRBC, Cr trending up   Assessment and Plan: Tonya Glass is a 77 y.o. female with a pertinent PMH of HFpEF, HLD, aortic stenosis s/p valve replacement, sick sinus syndrome, hypothyroidism, HTN, CKD, COPD on home oxygen 4L, and T2DM who presented with dyspnea and was admitted for CHF exacerbation.  Patient is now awaiting L/R heart cath per cardiology, pending creatinine improvement.  She is stable and her volume status appears euvolemic.  There is no clear etiology for increased creatinine, no evidence of postobstructive uropathy and now discontinued Lasix.  No reason to consult nephrology at this time, will continue to follow-up for improvement and obtain renal ultrasound.  Hemoglobin still holding stable.  Oxygen requirement of 2 L at baseline is greater than at home, did notice wheezing on lung exam today and will offer DuoNebs.  Still some question of HAP and will finish antibiotic treatment for 5 days total. Assessment & Plan Acute exacerbation of CHF (congestive heart failure) [facility]) Cardiology holding Lasix drip and all other diuresis in setting of elevated creatinine with plan to do L/R heart cath as Cr stabilizes.  Appears nearly euvolemic on exam.  Net negative 8.5 L since admission. -Daily weights, strict I&Os -PT OT evaluate and treat -Out of bed order with meals -Consult RD given poor p.o. intake and nutrition, follow up recs -Cardiology following, recs appreciated   -Hold Lasix drip to allow for renovascular equilibration -Hydralazine 25 mg TID, Imdur 15 mg daily, carvedilol 3.125 mg BID -L/R heart cath when  creatinine and respiratory function improve Acute kidney injury superimposed on stage 3b chronic kidney disease (HCC) Cr up to 2.26, former baseline ~1.5-1.7.  Elevation thought to be in setting of Lasix diuresis, but Lasix has been stopped.  Bladder scans normal.  No clear etiology for creatinine rise, but this may be new baseline.  Per Cardiology, would require improvement in Cr prior to heart cath procedure. -Hold Jardiance and irbesartan for now -Daily bladder scan to ensure patient is not retaining and creatinine is not increasing because of postobstructive uropathy -Renal US to evaluate for hydronephrosis, other pathology -Daily BMP Acute on chronic hypoxic respiratory failure (HCC) Possible HAP given CXR and increased oxygen requirement.  On 2 L O2 this morning, intermittently requiring more; appears to require 4 L with exertion at baseline but none when at rest normally.  Does have wheezing on lung exam today. -Continue linezolid 600 mg every 12 hours for possible HAP (day 3 of 5) -Requested administration of PRN DuoNeb this AM Anemia Hgb 9.1>9.7.  1 unit PRBC on 12/1.  No sign of acute bleeding, suspected to be secondary to anemia of chronic disease given ferritin WNL, though etiology not fully clear and will obtain outpatient colonoscopy.  Is already on Carafate TID. -Continue home iron 325 mg daily -Transfuse <8.0 Hgb given she has has had significant UOP with diuresis -Trend daily hemoglobin on CBC LV thrombus Seen on echo.  Blood CX NGTD at 4d with fever to 100.5 once on 11/29, but continues to remain afebrile.  Lower concern for endocarditis at this time. -Monitor fever curve, discontinued vancomycin -Continue heparin drip -Cardiology planning for L/R  heart cath to evaluate for coronary artery embolism when creatinine is improved COPD (chronic obstructive pulmonary disease) (HCC) Suspect CHF is driving patient's current dyspnea.  Respiratory status has improved, has not required  DuoNebs and no longer on HFNC.  Discussed importance of adherence to BiPAP overnight, will motivate patient at night shift. -Wean O2 as able with goal O2 88-92%, home baseline 4 L with exertion -DuoNebs every 4 hours PRN -Continue Dulera 2 puffs BID (on Symbicort formulary equivalent) -Continue guaifenesin 600 mg BID -Nightly BiPAP, patient continues to be nonadherent DM2 (diabetes mellitus, type 2) (HCC) A1c 6.0.  CBG in last 24 hours have remained appropriate.  7 units SAI in past 24 hours. 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 -Hold home Eliquis 5mg  BID -Currently on heparin drip for LV apical thrombus -AM BMP, mag; goal K>4, mag>2 Constipation Last BM 12/1. -Continue bowel regimen with MiraLAX and senna  Chronic and Stable Problems: Hypothyroidism: Continue levothyroxine 75 mcg daily Depression: Continue fluoxetine 40 mg daily, decreased trazodone to 25 mg nightly given somnolence Peripheral neuropathy: Continue gabapentin 600 mg nightly Osteoporosis: On alendronate 70 mg every Sunday HLD: Continue home atorvastatin 40 mg daily HTN: Continue clonidine 0.2 mg nightly, irbesartan 150 mg daily (olmesartan 20 mg formulary equivalent), hold carvedilol 3.125 mg BID Wounds to right leg: Wound care following continue compression wraps bilateral legs GERD: Continue home carafate 1g TID, pantoprazole 40mg  PO BID  FEN/GI: Heart healthy diet PPx: Heparin Dispo:Home pending clinical improvement . Barriers include needing continued diuresis.  Subjective:  This morning, patient states that she does not remember if she wore BiPAP last night or not.  States that "if they give it to me, I will wear it."  She states she is not short of breath and she is hesitant to get out of bed, but agrees to try.  Complains of some toe pain  bilaterally, which is chronic.  Objective: Temp:  [97.7 F (36.5 C)-98.4 F (36.9 C)] 97.7 F (36.5 C) (12/03 0437) Pulse Rate:  [66-77] 71 (12/03 0437) Resp:  [12-18] 18 (12/03 0437) BP: (115-141)/(41-52) 131/52 (12/03 0437) SpO2:  [92 %-99 %] 96 % (12/03 0437) FiO2 (%):  [32 %] 32 % (12/03 0250) Weight:  [95.7 kg] 95.7 kg (12/03 0437)  Physical Exam: General: Age-appropriate, resting comfortably in bed, NAD, alert and at baseline. HEENT: MMM. Cardiovascular: No JVD.  Regular rate and rhythm. Normal S1/S2. No murmurs, rubs, or gallops appreciated. 2+ radial pulses. Pulmonary: Mild diffuse wheezing on auscultation, no work of breathing on 2 L [place].  No rales or crackles.   Abdominal: Normoactive bowel sounds, nondistended. No tenderness to deep or light palpation. No rebound or guarding. Skin: Warm and dry. Extremities: 1+ pitting edema ankles bilaterally, wearing compression wraps.  Laboratory: Most recent CBC Lab Results  Component Value Date   WBC 9.7 04/16/2023   HGB 9.7 (L) 04/16/2023   HCT 31.3 (L) 04/16/2023   MCV 93.7 04/16/2023   PLT 259 04/16/2023   Most recent BMP    Latest Ref Rng & Units 04/15/2023    6:55 AM  BMP  Glucose 70 - 99 mg/dL 161   BUN 8 - 23 mg/dL 34   Creatinine 0.96 - 1.00 mg/dL 0.45   Sodium 409 - 811 mmol/L 138   Potassium 3.5 - 5.1 mmol/L 3.5   Chloride  98 - 111 mmol/L 87   CO2 22 - 32 mmol/L 36   Calcium 8.9 - 10.3 mg/dL 8.7     Other pertinent labs: -None  New Imaging/Diagnostic Tests: -None  Claudio Mondry, MD 04/16/2023, 7:51 AM  PGY-1, [facility] FPTS Intern pager: [phone], text pages welcome Secure chat group [facility] Teaching Service

## 2023-04-16 NOTE — Plan of Care (Signed)

## 2023-04-16 NOTE — Progress Notes (Signed)
Rounding Note    Patient Name: Tonya Glass Date of Encounter: 04/16/2023  [facility] Cardiologist: Chrystie Nose, MD   Subjective   Seen after working with PT to stand up. Feels fatigued. Has had several bowel movements. Reports that she slept well overnight. Reviewed potential plans for cath and timing, see below.  Inpatient Medications    Scheduled Meds:  atorvastatin  40 mg Oral Daily   carvedilol  3.125 mg Oral BID WC   Chlorhexidine Gluconate Cloth  6 each Topical Q0600   ferrous sulfate  325 mg Oral Q1200   FLUoxetine  40 mg Oral Daily   gabapentin  600 mg Oral QHS   hydrALAZINE  25 mg Oral Q8H   insulin aspart  0-9 Units Subcutaneous TID WC   isosorbide mononitrate  15 mg Oral Daily   levothyroxine  75 mcg Oral Daily   linezolid  600 mg Oral Q12H   mometasone-formoterol  2 puff Inhalation BID   multivitamin with minerals  1 tablet Oral Daily   mupirocin ointment  1 Application Nasal BID   pantoprazole  40 mg Oral BID   polyethylene glycol  17 g Oral BID   senna  1 tablet Oral Daily   sodium chloride flush  3 mL Intravenous Q12H   sucralfate  1 g Oral TID WC & HS   traZODone  25 mg Oral QHS   umeclidinium bromide  1 puff Inhalation Daily   Continuous Infusions:  ceFEPime (MAXIPIME) IV 2 g (04/15/23 1330)   heparin 1,200 Units/hr (04/15/23 2212)   PRN Meds: acetaminophen **OR** acetaminophen, ipratropium-albuterol, sodium chloride flush   Vital Signs    Vitals:   04/16/23 0025 04/16/23 0250 04/16/23 0437 04/16/23 0847  BP: (!) 134/52  (!) 131/52 (!) 126/39  Pulse: 72 66 71 76  Resp: 18 12 18 20   Temp: 98.3 F (36.8 C)  97.7 F (36.5 C) 98.3 F (36.8 C)  TempSrc: Oral  Axillary Oral  SpO2: 93% 97% 96% 95%  Weight:   95.7 kg   Height:        Intake/Output Summary (Last 24 hours) at 04/16/2023 1111 Last data filed at 04/16/2023 0300 Gross per 24 hour  Intake 173 ml  Output 550 ml  Net -377 ml      04/16/2023    4:37 AM 04/15/2023     4:34 AM 04/14/2023    4:22 AM  Last 3 Weights  Weight (lbs) 210 lb 15.7 oz 208 lb 12.4 oz 217 lb 13 oz  Weight (kg) 95.7 kg 94.7 kg 98.8 kg      Telemetry    V paced - Personally Reviewed  Physical Exam   GEN: Well nourished, well developed in no acute distress. O2 by nasal cannula NECK: No JVD appreciated sitting up at 90 degrees CARDIAC: regular rhythm, normal S1 and S2, no rubs or gallops. 1/6 systolic murmur. VASCULAR: Radial pulses 2+ bilaterally.  RESPIRATORY:  Diminished at bases, slightly coarse in rest of lung fields ABDOMEN: Soft, non-tender, non-distended MUSCULOSKELETAL:  Moves all 4 limbs independently SKIN: Warm and dry, bilateral LE with chronic venous stasis changes, +brawny edema but no pitting NEUROLOGIC:  No focal neuro deficits noted. PSYCHIATRIC:  Normal affect    New pertinent results (labs, ECG, imaging, cardiac studies)    TTE 04/11/23 reviewed  Patient Profile     77 y.o. female with PMH nonobstructive CAD, surgical AVR 2014 (23 mm perimount magna ease) with aortic root repair, complete heart  block s/p DC PPM, pulmonary hypertension, CKD stage 3b, type II diabetes, hypertension who presented   Assessment & Plan    Acute on chronic systolic and diastolic heart failure Acute on chronic hypoxemic and hypercarbic respiratory failure Type II diabetes Hypertension -EF 35-40% with regional wall motion abnormalities in the septum. Normal RV, RVSP 19 mmhg, RAP 3 mmHg -with normal RA and RV pressures, does not appear that she has significant extra volume intravascularly. Will aim for afterload reduction, on hydralazine and imdur. Stopped lasix drip 12/2. Allow for vascular equilibration, reassess filling pressures on cath -baseline uses 4L [place] O2 at home. -with above pressures, multifocal opacities on CXR, and O2 requirement, agree with primary team's treatment for pneumonia -suggests significant LE edema is likely not cardiac in etiology, likely venous  insufficiency. Has history of stasis ulcers as well  We discussed cath at length today. She chronically cannot lie flat, is unsure if she could tolerate lying with wedge. She does have reduced EF and septal wall motion abnormalities, which is new, but prior cath a year ago with nonobstructive disease. With her acute kidney injury on CKD 4, I am concerned about contrast.  We discussed options and will plan for the following: if she can lay at an angle in the AM and kidney function improved, will plan for right and left heart cath. If her renal function is not improved, we will plan for right heart cath only to assess filling pressures. She is amenable. Will make NPO and schedule cath once we know tomorrow whether it will be right only or right and left.  Informed Consent   Shared Decision Making/Informed Consent The risks [stroke (1 in 1000), death (1 in 1000), kidney failure [usually temporary] (1 in 500), bleeding (1 in 200), allergic reaction [possibly serious] (1 in 200)], benefits (diagnostic support and management of coronary artery disease) and alternatives of a cardiac catheterization were discussed in detail with Ms. Birkeland and she is willing to proceed.      LV apical thrombus Paroxysmal atrial flutter -on heparin drip, plan for DOAC post cath -CHA2DS2/VAS Stroke Risk Points=9  -cautious given anemia , s/p transfusion 04/14/23  Chronic kidney disease stage 4 -Cr continues to rise, 2.26 today, nadir 1.83 -with RAP and RV pressure normal on echo, concern would be for overdiuresis. Will focus on afterload reduction. Lasix drip stopped 12/2. Continue imdur, hydralazine given renal function  Pulmonary hypertension, WHO group 3 COPD  OSA -RHC as above when stable, though echo pressures were normal  Bioprosthetic aortic valve with paravalvular leak -stable, mean gradient 19 mmHg -known moderate AI perivalvular previously, not well seen on most recent echo  Complete heart block s/p DC  PPM -v paced 98% of the time. This may be the etiology of her cardiomyopathy. Would consider CRT upgrade once stable  High complexity medical decision making given multiple high risk comorbidities    Signed, Jodelle Red, MD  04/16/2023, 11:11 AM

## 2023-04-16 NOTE — Assessment & Plan Note (Signed)
Suspect CHF is driving patient's current dyspnea.  Respiratory status has improved, has not required DuoNebs and no longer on HFNC.  Discussed importance of adherence to BiPAP overnight, will motivate patient at night shift. -Wean O2 as able with goal O2 88-92%, home baseline 4 L with exertion -DuoNebs every 4 hours PRN -Continue Dulera 2 puffs BID (on Symbicort formulary equivalent) -Continue guaifenesin 600 mg BID -Nightly BiPAP, patient continues to be nonadherent

## 2023-04-16 NOTE — Assessment & Plan Note (Addendum)
Possible HAP given CXR and increased oxygen requirement.  On 2 L O2 this morning, intermittently requiring more; appears to require 4 L with exertion at baseline but none when at rest normally.  Does have wheezing on lung exam today. -Continue linezolid 600 mg every 12 hours for possible HAP (day 3 of 5) -Requested administration of PRN DuoNeb this AM

## 2023-04-16 NOTE — Progress Notes (Signed)
Occupational Therapy Treatment Patient Details Name: Tonya Glass MRN: [id number] DOB: [birth year] Today's Date: 04/16/2023   History of present illness Tonya Glass is 77 y.o. admitted for acute on chronic heart failure exacerbation. Small LV thrombus noted with AKI and anemia. Pt requiring intermittent bipap. Pertinent PMH/PSH includes HFpEF, HLD, Aortic stenosis s/p valve replacement, Sick sinus syndrome, Hypothyroidism, HTN, CKD, COPD, T2DM.   OT comments  Pt making gradual progress towards OT goals. Session focused on initial standing and OOB transfers. With use of [place] and elevated bed (to simulate lift chair that pt uses), pt able to stand with Max A x 2. In standing, pt with multiple bouts of loose stool, requiring Total A and extended time for cleanup. Pt left in recliner w/ maximove lift pad for back to bed transfer. Patient will benefit from continued inpatient follow up therapy, <3 hours/day at DC unless [facility] able to provide the physical assist that pt currently needs.       If plan is discharge home, recommend the following:  Two people to help with walking and/or transfers;Two people to help with bathing/dressing/bathroom;Assist for transportation;A lot of help with walking and/or transfers   Equipment Recommendations  Other (comment) (TBD)    Recommendations for Other Services      Precautions / Restrictions Precautions Precautions: Fall Restrictions Weight Bearing Restrictions: No       Mobility Bed Mobility Overal bed mobility: Needs Assistance Bed Mobility: Supine to Sit     Supine to sit: Mod assist, +2 for physical assistance, +2 for safety/equipment, HOB elevated, Used rails          Transfers Overall transfer level: Needs assistance Equipment used: Ambulation equipment used Transfers: Sit to/from Stand Sit to Stand: Max assist, +2 physical assistance, +2 safety/equipment, From elevated surface           General transfer comment: pt  typically uses lift chair to stand, Max A x 2 for standing in [place] from elevated bed. Able to stand from [facility] pads without assist multiple times due to bowel incontinence and pt need for rest breaks from fatigue     Balance Overall balance assessment: Needs assistance Sitting-balance support: Feet unsupported, Bilateral upper extremity supported Sitting balance-Leahy Scale: Fair     Standing balance support: Bilateral upper extremity supported, During functional activity Standing balance-Leahy Scale: Poor                             ADL either performed or assessed with clinical judgement   ADL Overall ADL's : Needs assistance/impaired                     Lower Body Dressing: Total assistance;Sitting/lateral leans Lower Body Dressing Details (indicate cue type and reason): sock mgmt, has assist with this at baseline     Toileting- Clothing Manipulation and Hygiene: Total assistance;Sit to/from stand;+2 for safety/equipment Toileting - Clothing Manipulation Details (indicate cue type and reason): multiple instances of bowel incontinence standing in [place], Total A and extended time for cleanup            Extremity/Trunk Assessment Upper Extremity Assessment Upper Extremity Assessment: Right hand dominant;Generalized weakness   Lower Extremity Assessment Lower Extremity Assessment: Defer to PT evaluation        Vision   Vision Assessment?: No apparent visual deficits   Perception     Praxis      Cognition Arousal: Alert Behavior During Therapy: [facility] for  tasks assessed/performed Overall Cognitive Status: Within Functional Limits for tasks assessed                                          Exercises      Shoulder Instructions       General Comments      Pertinent Vitals/ Pain       Pain Assessment Pain Assessment: Faces Faces Pain Scale: Hurts little more Pain Location: BLE (L>R) Pain Descriptors / Indicators:  Grimacing, Guarding, Sore Pain Intervention(s): Monitored during session, Limited activity within patient's tolerance  Home Living                                          Prior Functioning/Environment              Frequency  Min 1X/week        Progress Toward Goals  OT Goals(current goals can now be found in the care plan section)  Progress towards OT goals: Progressing toward goals  Acute Rehab OT Goals Patient Stated Goal: increase strength OT Goal Formulation: With patient Time For Goal Achievement: 04/26/23 Potential to Achieve Goals: Good ADL Goals Pt Will Perform Lower Body Bathing: with mod assist;sit to/from stand;with adaptive equipment Pt Will Perform Lower Body Dressing: with mod assist;sit to/from stand;with adaptive equipment Pt Will Transfer to Toilet: with mod assist;stand pivot transfer;bedside commode Pt Will Perform Toileting - Clothing Manipulation and hygiene: with mod assist;sit to/from stand Pt/caregiver will Perform Home Exercise Program: Increased strength;Both right and left upper extremity;With written HEP provided;With Supervision;With theraband Additional ADL Goal #1: Pt will transfer supine to sit with mod assist in preparation for selfcare tasks.  Plan      Co-evaluation                 AM-PAC OT "6 Clicks" Daily Activity     Outcome Measure   Help from another person eating meals?: None Help from another person taking care of personal grooming?: A Little Help from another person toileting, which includes using toliet, bedpan, or urinal?: Total Help from another person bathing (including washing, rinsing, drying)?: Total Help from another person to put on and taking off regular upper body clothing?: A Little Help from another person to put on and taking off regular lower body clothing?: Total 6 Click Score: 13    End of Session Equipment Utilized During Treatment: Gait belt;Oxygen  OT Visit Diagnosis:  Unsteadiness on feet (R26.81);Other abnormalities of gait and mobility (R26.89);Pain Pain - Right/Left: Right Pain - part of body: Leg   Activity Tolerance Patient tolerated treatment well   Patient Left in chair;with call bell/phone within reach;with chair alarm set   Nurse Communication Mobility status;Need for lift equipment        Time: 1004-1047 OT Time Calculation (min): 43 min  Charges: OT General Charges $OT Visit: 1 Visit OT Treatments $Self Care/Home Management : 23-37 mins $Therapeutic Activity: 8-22 mins  Bradd Canary, OTR/L Acute Rehab Services Office: [phone]   Lorre Munroe 04/16/2023, 11:30 AM

## 2023-04-16 NOTE — TOC Progression Note (Addendum)
Transition of Care [facility]) - Progression Note    Patient Details  Name: Lillyanna Ciolli MRN: [id number] Date of Birth: [birth year]  Transition of Care [facility]) CM/SW Contact  Michaela Corner, [place] Phone Number: 04/16/2023, 2:22 PM  Clinical Narrative:   CSW called [facility] about [facility] stated [facility] from LTC facility. Clydie Braun stated that CSW will just need to get auth for them to take pt back.  2:57PM: FL2 done. CSW reached out to PT to see pt - need new PT note for ins auth.    Barriers to Discharge: Continued Medical Work up  Expected Discharge Plan and Services       Living arrangements for the past 2 months: Assisted Living Facility                                       Social Determinants of Health (SDOH) Interventions SDOH Screenings   Food Insecurity: No Food Insecurity (04/11/2023)  Housing: Low Risk  (04/11/2023)  Transportation Needs: No Transportation Needs (04/11/2023)  Utilities: Not At Risk (04/11/2023)  Social Connections: Unknown (09/25/2021)   Received from [facility]  Tobacco Use: Medium Risk (04/10/2023)    Readmission Risk Interventions     No data to display

## 2023-04-17 ENCOUNTER — Encounter (HOSPITAL_COMMUNITY)
Admission: EM | Disposition: A | Discharge status: Skilled Nursing Facility | Payer: Self-pay | Source: Skilled Nursing Facility | Attending: Family Medicine

## 2023-04-17 ENCOUNTER — Inpatient Hospital Stay (HOSPITAL_COMMUNITY): Payer: 59

## 2023-04-17 ENCOUNTER — Encounter (HOSPITAL_COMMUNITY): Payer: Self-pay | Admitting: Cardiovascular Disease

## 2023-04-17 DIAGNOSIS — I5021 Acute systolic (congestive) heart failure: Secondary | ICD-10-CM | POA: Diagnosis not present

## 2023-04-17 DIAGNOSIS — N179 Acute kidney failure, unspecified: Secondary | ICD-10-CM | POA: Diagnosis not present

## 2023-04-17 DIAGNOSIS — I5022 Chronic systolic (congestive) heart failure: Secondary | ICD-10-CM | POA: Diagnosis not present

## 2023-04-17 DIAGNOSIS — I4892 Unspecified atrial flutter: Secondary | ICD-10-CM | POA: Diagnosis not present

## 2023-04-17 DIAGNOSIS — J9621 Acute and chronic respiratory failure with hypoxia: Secondary | ICD-10-CM | POA: Diagnosis not present

## 2023-04-17 HISTORY — PX: RIGHT HEART CATH: CATH118263

## 2023-04-17 LAB — BASIC METABOLIC PANEL
Anion gap: 12 (ref 5–15)
BUN: 46 mg/dL — ABNORMAL HIGH (ref 8–23)
CO2: 32 mmol/L (ref 22–32)
Calcium: 8.8 mg/dL — ABNORMAL LOW (ref 8.9–10.3)
Chloride: 93 mmol/L — ABNORMAL LOW (ref 98–111)
Creatinine, Ser: 2.39 mg/dL — ABNORMAL HIGH (ref 0.44–1.00)
GFR, Estimated: 20 mL/min — ABNORMAL LOW (ref >60)
Glucose, Bld: 197 mg/dL — ABNORMAL HIGH (ref 70–99)
Potassium: 4.1 mmol/L (ref 3.5–5.1)
Sodium: 137 mmol/L (ref 135–145)

## 2023-04-17 LAB — POCT I-STAT EG7
Acid-Base Excess: 10 mmol/L — ABNORMAL HIGH (ref 0.0–2.0)
Acid-Base Excess: 11 mmol/L — ABNORMAL HIGH (ref 0.0–2.0)
Bicarbonate: 34.8 mmol/L — ABNORMAL HIGH (ref 20.0–28.0)
Bicarbonate: 35.8 mmol/L — ABNORMAL HIGH (ref 20.0–28.0)
Calcium, Ion: 1.02 mmol/L — ABNORMAL LOW (ref 1.15–1.40)
Calcium, Ion: 1.08 mmol/L — ABNORMAL LOW (ref 1.15–1.40)
HCT: 30 % — ABNORMAL LOW (ref 36.0–46.0)
HCT: 32 % — ABNORMAL LOW (ref 36.0–46.0)
Hemoglobin: 10.2 g/dL — ABNORMAL LOW (ref 12.0–15.0)
Hemoglobin: 10.9 g/dL — ABNORMAL LOW (ref 12.0–15.0)
O2 Saturation: 60 %
O2 Saturation: 61 %
Potassium: 3.1 mmol/L — ABNORMAL LOW (ref 3.5–5.1)
Potassium: 3.3 mmol/L — ABNORMAL LOW (ref 3.5–5.1)
Sodium: 137 mmol/L (ref 135–145)
Sodium: 139 mmol/L (ref 135–145)
TCO2: 36 mmol/L — ABNORMAL HIGH (ref 22–32)
TCO2: 37 mmol/L — ABNORMAL HIGH (ref 22–32)
pCO2, Ven: 47.1 mm[Hg] (ref 44–60)
pCO2, Ven: 48.4 mm[Hg] (ref 44–60)
pH, Ven: 7.477 — ABNORMAL HIGH (ref 7.25–7.43)
pH, Ven: 7.477 — ABNORMAL HIGH (ref 7.25–7.43)
pO2, Ven: 29 mm[Hg] — CL (ref 32–45)
pO2, Ven: 30 mm[Hg] — CL (ref 32–45)

## 2023-04-17 LAB — CULTURE, BLOOD (ROUTINE X 2)
Culture: NO GROWTH
Culture: NO GROWTH

## 2023-04-17 LAB — GLUCOSE, CAPILLARY
Glucose-Capillary: 187 mg/dL — ABNORMAL HIGH (ref 70–99)
Glucose-Capillary: 170 mg/dL — ABNORMAL HIGH (ref 70–99)
Glucose-Capillary: 175 mg/dL — ABNORMAL HIGH (ref 70–99)
Glucose-Capillary: 154 mg/dL — ABNORMAL HIGH (ref 70–99)
Glucose-Capillary: 174 mg/dL — ABNORMAL HIGH (ref 70–99)

## 2023-04-17 LAB — CBC
HCT: 30.8 % — ABNORMAL LOW (ref 36.0–46.0)
Hemoglobin: 9.5 g/dL — ABNORMAL LOW (ref 12.0–15.0)
MCH: 28.6 pg (ref 26.0–34.0)
MCHC: 30.8 g/dL (ref 30.0–36.0)
MCV: 92.8 fL (ref 80.0–100.0)
Platelets: 269 10*3/uL (ref 150–400)
RBC: 3.32 MIL/uL — ABNORMAL LOW (ref 3.87–5.11)
RDW: 15 % (ref 11.5–15.5)
WBC: 10.2 10*3/uL (ref 4.0–10.5)
nRBC: 0.3 % — ABNORMAL HIGH (ref 0.0–0.2)

## 2023-04-17 LAB — APTT: aPTT: 86 s — ABNORMAL HIGH (ref 24–36)

## 2023-04-17 LAB — HEPARIN LEVEL (UNFRACTIONATED): Heparin Unfractionated: 0.96 [IU]/mL — ABNORMAL HIGH (ref 0.30–0.70)

## 2023-04-17 LAB — MAGNESIUM: Magnesium: 2.4 mg/dL (ref 1.7–2.4)

## 2023-04-17 SURGERY — RIGHT HEART CATH

## 2023-04-17 MED ORDER — LIDOCAINE HCL (PF) 1 % IJ SOLN
INTRAMUSCULAR | Status: AC
  Filled 2023-04-17: qty 30

## 2023-04-17 MED ORDER — LIDOCAINE HCL (PF) 1 % IJ SOLN
INTRAMUSCULAR | Status: DC | PRN
  Administered 2023-04-17: 2 mL

## 2023-04-17 MED ORDER — SODIUM CHLORIDE 0.9 % IV SOLN: 250.0000 mL | INTRAVENOUS | Status: AC | PRN

## 2023-04-17 MED ORDER — INSULIN GLARGINE-YFGN 100 UNIT/ML ~~LOC~~ SOLN
4.0000 [IU] | Freq: Every day | SUBCUTANEOUS | Status: DC
  Administered 2023-04-18 – 2023-04-22 (×5): 4 [IU] via SUBCUTANEOUS
  Filled 2023-04-17 (×5): qty 0.04

## 2023-04-17 MED ORDER — INSULIN GLARGINE-YFGN 100 UNIT/ML ~~LOC~~ SOLN: 4.0000 [IU] | Freq: Every day | SUBCUTANEOUS | Status: DC

## 2023-04-17 MED ORDER — PANTOPRAZOLE SODIUM 40 MG PO TBEC
40.0000 mg | DELAYED_RELEASE_TABLET | Freq: Every day | ORAL | Status: DC
  Administered 2023-04-18 – 2023-04-24 (×7): 40 mg via ORAL
  Filled 2023-04-17 (×8): qty 1

## 2023-04-17 MED ORDER — APIXABAN 5 MG PO TABS
5.0000 mg | ORAL_TABLET | Freq: Two times a day (BID) | ORAL | Status: DC
  Administered 2023-04-17 – 2023-04-24 (×14): 5 mg via ORAL
  Filled 2023-04-17 (×14): qty 1

## 2023-04-17 MED ORDER — GERHARDT'S BUTT CREAM
TOPICAL_CREAM | CUTANEOUS | Status: DC | PRN
  Filled 2023-04-17: qty 60

## 2023-04-17 SURGICAL SUPPLY — 5 items
CATH BALLN WEDGE 5F 110CM (CATHETERS) IMPLANT
GUIDEWIRE .025 260CM (WIRE) IMPLANT
PACK CARDIAC CATHETERIZATION (CUSTOM PROCEDURE TRAY) IMPLANT
SHEATH GLIDE SLENDER 4/5FR (SHEATH) IMPLANT
SHEATH PROBE COVER 6X72 (BAG) IMPLANT

## 2023-04-17 NOTE — Assessment & Plan Note (Addendum)
A1c 6.0.  CBG in last 24 hours have remained appropriate.  9 units SAI in past 24 hours. -Continue sensitive SSI -Start 4 units [place]gle[place] starting tomorrow -Hold home Jardiance 10 mg in setting of AKI -Hold home glipizide 10 mg BID

## 2023-04-17 NOTE — H&P (View-Only) (Signed)
Rounding Note    Patient Name: Tonya Glass Date of Encounter: 04/17/2023  [facility] Cardiologist: Chrystie Nose, MD   Subjective   Feeling much better today, got decent sleep. She is limited in her range of motion by back pain--attempted to lay her nearly flat, she was comfortable laying at 20 degrees with her neck supported by a pillow. Reviewed plans for cath today. Given her renal function, will not pursue coronary angiography at this time, but will pursue right heart cath.  Inpatient Medications    Scheduled Meds:  atorvastatin  40 mg Oral Daily   carvedilol  3.125 mg Oral BID WC   Chlorhexidine Gluconate Cloth  6 each Topical Q0600   ferrous sulfate  325 mg Oral Q1200   FLUoxetine  40 mg Oral Daily   gabapentin  600 mg Oral QHS   hydrALAZINE  25 mg Oral Q8H   insulin aspart  0-9 Units Subcutaneous TID WC   isosorbide mononitrate  15 mg Oral Daily   levothyroxine  75 mcg Oral Daily   linezolid  600 mg Oral Q12H   mometasone-formoterol  2 puff Inhalation BID   multivitamin with minerals  1 tablet Oral Daily   mupirocin ointment  1 Application Nasal BID   pantoprazole  40 mg Oral BID   polyethylene glycol  17 g Oral BID   senna  1 tablet Oral Daily   sodium chloride flush  3 mL Intravenous Q12H   sucralfate  1 g Oral TID WC & HS   traZODone  25 mg Oral QHS   umeclidinium bromide  1 puff Inhalation Daily   Continuous Infusions:  ceFEPime (MAXIPIME) IV 2 g (04/16/23 1228)   heparin 1,250 Units/hr (04/16/23 2006)   PRN Meds: acetaminophen **OR** acetaminophen, ipratropium-albuterol, sodium chloride flush   Vital Signs    Vitals:   04/16/23 2350 04/17/23 0025 04/17/23 0423 04/17/23 0750  BP:  (!) 144/53 139/61 (!) 142/58  Pulse: 71 72 76 84  Resp: 18 20 20 16   Temp:  97.6 F (36.4 C) 98.6 F (37 C) 97.6 F (36.4 C)  TempSrc:  Axillary Axillary Oral  SpO2: 99% 98% 99% 100%  Weight:      Height:        Intake/Output Summary (Last 24 hours)  at 04/17/2023 1012 Last data filed at 04/17/2023 0001 Gross per 24 hour  Intake 120 ml  Output 190 ml  Net -70 ml      04/16/2023    4:37 AM 04/15/2023    4:34 AM 04/14/2023    4:22 AM  Last 3 Weights  Weight (lbs) 210 lb 15.7 oz 208 lb 12.4 oz 217 lb 13 oz  Weight (kg) 95.7 kg 94.7 kg 98.8 kg      Telemetry    V paced - Personally Reviewed  Physical Exam   GEN: Well nourished, well developed in no acute distress. O2 by nasal cannula NECK: No JVD appreciated at 45 degrees CARDIAC: regular rhythm, normal S1 and S2, no rubs or gallops. 1-2/6 systolic murmur. VASCULAR: Radial pulses 2+ bilaterally.  RESPIRATORY:  Diminished at bases, mildly coarse throughout ABDOMEN: Soft, non-tender, non-distended MUSCULOSKELETAL:  Moves all 4 limbs independently SKIN: Warm and dry, bilateral LE edema with chronic venous stasis changes, predominantly brawny edema without significant pitting NEUROLOGIC:  No focal neuro deficits noted. PSYCHIATRIC:  Normal affect    New pertinent results (labs, ECG, imaging, cardiac studies)    TTE 04/11/23 reviewed  Patient Profile  77 y.o. female with PMH nonobstructive CAD, surgical AVR 2014 (23 mm perimount magna ease) with aortic root repair, complete heart block s/p DC PPM, pulmonary hypertension, CKD stage 3b, type II diabetes, hypertension who presented   Assessment & Plan    Acute on chronic systolic and diastolic heart failure Acute on chronic hypoxemic and hypercarbic respiratory failure Type II diabetes Hypertension -EF 35-40% with regional wall motion abnormalities in the septum. Normal RV, RVSP 19 mmhg, RAP 3 mmHg -with normal RA and RV pressures on echo, does not appear that she has significant extra volume intravascularly. Will aim for afterload reduction, on hydralazine and imdur. Stopped lasix drip 12/2. Allow for vascular equilibration, reassess filling pressures on cath -baseline uses 4L [place] O2 at home. -with above pressures, multifocal  opacities on CXR, and O2 requirement, agree with primary team's treatment for pneumonia -suggests significant LE edema is likely not cardiac in etiology, likely venous insufficiency. Has history of stasis ulcers as well  See extensive discussion of cath from 04/16/23. Given renal function, will not pursue coronary angiography at this time. With uncertainty regarding filling pressures/degree of heart failure, RHC will help [place] determine next steps. She is amenable. Can lie at about 20 degrees with neck support (limited due to chronic back pain).  LV apical thrombus Paroxysmal atrial flutter -on heparin drip, plan for DOAC post cath -CHA2DS2/VAS Stroke Risk Points=9  -cautious given anemia , s/p transfusion 04/14/23  Chronic kidney disease stage 4 -Cr continues to rise, 2.39 today, nadir 1.83 -with RAP and RV pressure normal on echo, concern would be for overdiuresis. Will focus on afterload reduction. Lasix drip stopped 12/2. Continue imdur, hydralazine given renal function -RHC today for further evaluation  Pulmonary hypertension, WHO group 3 COPD  OSA -per primary team, with RHC as above to evaluate for pulmonary hypertension  Bioprosthetic aortic valve with paravalvular leak -stable, mean gradient 19 mmHg -known moderate AI perivalvular previously, not well seen on most recent echo  Complete heart block s/p DC PPM -v paced 98% of the time. This may be the etiology of her cardiomyopathy. Would consider CRT upgrade once stable  Total time of encounter: I spent 50 minutes dedicated to the care of this patient on the date of this encounter to include  review of records, face-to-face time with the patient discussing conditions above, and clinical documentation with the electronic health record. We specifically spent time today discussing cath options, trialing lying flat for several minutes, and arranging cardiac procedure for today with Dr. Kirke Corin.  Jodelle Red, MD, PhD, [facility]  [facility]  [place]  Heart & Vascular at [facility] at [facility] [address] [place], [place] [address] [phone]     Signed, Jodelle Red, MD  04/17/2023, 10:12 AM

## 2023-04-17 NOTE — Assessment & Plan Note (Addendum)
Last BM today, 12/4. -Continue bowel regimen with MiraLAX and senna

## 2023-04-17 NOTE — Progress Notes (Signed)
Rounding Note    Patient Name: Tonya Glass Date of Encounter: 04/17/2023  [facility] Cardiologist: Chrystie Nose, MD   Subjective   Feeling much better today, got decent sleep. She is limited in her range of motion by back pain--attempted to lay her nearly flat, she was comfortable laying at 20 degrees with her neck supported by a pillow. Reviewed plans for cath today. Given her renal function, will not pursue coronary angiography at this time, but will pursue right heart cath.  Inpatient Medications    Scheduled Meds:  atorvastatin  40 mg Oral Daily   carvedilol  3.125 mg Oral BID WC   Chlorhexidine Gluconate Cloth  6 each Topical Q0600   ferrous sulfate  325 mg Oral Q1200   FLUoxetine  40 mg Oral Daily   gabapentin  600 mg Oral QHS   hydrALAZINE  25 mg Oral Q8H   insulin aspart  0-9 Units Subcutaneous TID WC   isosorbide mononitrate  15 mg Oral Daily   levothyroxine  75 mcg Oral Daily   linezolid  600 mg Oral Q12H   mometasone-formoterol  2 puff Inhalation BID   multivitamin with minerals  1 tablet Oral Daily   mupirocin ointment  1 Application Nasal BID   pantoprazole  40 mg Oral BID   polyethylene glycol  17 g Oral BID   senna  1 tablet Oral Daily   sodium chloride flush  3 mL Intravenous Q12H   sucralfate  1 g Oral TID WC & HS   traZODone  25 mg Oral QHS   umeclidinium bromide  1 puff Inhalation Daily   Continuous Infusions:  ceFEPime (MAXIPIME) IV 2 g (04/16/23 1228)   heparin 1,250 Units/hr (04/16/23 2006)   PRN Meds: acetaminophen **OR** acetaminophen, ipratropium-albuterol, sodium chloride flush   Vital Signs    Vitals:   04/16/23 2350 04/17/23 0025 04/17/23 0423 04/17/23 0750  BP:  (!) 144/53 139/61 (!) 142/58  Pulse: 71 72 76 84  Resp: 18 20 20 16   Temp:  97.6 F (36.4 C) 98.6 F (37 C) 97.6 F (36.4 C)  TempSrc:  Axillary Axillary Oral  SpO2: 99% 98% 99% 100%  Weight:      Height:        Intake/Output Summary (Last 24 hours)  at 04/17/2023 1012 Last data filed at 04/17/2023 0001 Gross per 24 hour  Intake 120 ml  Output 190 ml  Net -70 ml      04/16/2023    4:37 AM 04/15/2023    4:34 AM 04/14/2023    4:22 AM  Last 3 Weights  Weight (lbs) 210 lb 15.7 oz 208 lb 12.4 oz 217 lb 13 oz  Weight (kg) 95.7 kg 94.7 kg 98.8 kg      Telemetry    V paced - Personally Reviewed  Physical Exam   GEN: Well nourished, well developed in no acute distress. O2 by nasal cannula NECK: No JVD appreciated at 45 degrees CARDIAC: regular rhythm, normal S1 and S2, no rubs or gallops. 1-2/6 systolic murmur. VASCULAR: Radial pulses 2+ bilaterally.  RESPIRATORY:  Diminished at bases, mildly coarse throughout ABDOMEN: Soft, non-tender, non-distended MUSCULOSKELETAL:  Moves all 4 limbs independently SKIN: Warm and dry, bilateral LE edema with chronic venous stasis changes, predominantly brawny edema without significant pitting NEUROLOGIC:  No focal neuro deficits noted. PSYCHIATRIC:  Normal affect    New pertinent results (labs, ECG, imaging, cardiac studies)    TTE 04/11/23 reviewed  Patient Profile  77 y.o. female with PMH nonobstructive CAD, surgical AVR 2014 (23 mm perimount magna ease) with aortic root repair, complete heart block s/p DC PPM, pulmonary hypertension, CKD stage 3b, type II diabetes, hypertension who presented   Assessment & Plan    Acute on chronic systolic and diastolic heart failure Acute on chronic hypoxemic and hypercarbic respiratory failure Type II diabetes Hypertension -EF 35-40% with regional wall motion abnormalities in the septum. Normal RV, RVSP 19 mmhg, RAP 3 mmHg -with normal RA and RV pressures on echo, does not appear that she has significant extra volume intravascularly. Will aim for afterload reduction, on hydralazine and imdur. Stopped lasix drip 12/2. Allow for vascular equilibration, reassess filling pressures on cath -baseline uses 4L [place] O2 at home. -with above pressures, multifocal  opacities on CXR, and O2 requirement, agree with primary team's treatment for pneumonia -suggests significant LE edema is likely not cardiac in etiology, likely venous insufficiency. Has history of stasis ulcers as well  See extensive discussion of cath from 04/16/23. Given renal function, will not pursue coronary angiography at this time. With uncertainty regarding filling pressures/degree of heart failure, RHC will help [place] determine next steps. She is amenable. Can lie at about 20 degrees with neck support (limited due to chronic back pain).  LV apical thrombus Paroxysmal atrial flutter -on heparin drip, plan for DOAC post cath -CHA2DS2/VAS Stroke Risk Points=9  -cautious given anemia , s/p transfusion 04/14/23  Chronic kidney disease stage 4 -Cr continues to rise, 2.39 today, nadir 1.83 -with RAP and RV pressure normal on echo, concern would be for overdiuresis. Will focus on afterload reduction. Lasix drip stopped 12/2. Continue imdur, hydralazine given renal function -RHC today for further evaluation  Pulmonary hypertension, WHO group 3 COPD  OSA -per primary team, with RHC as above to evaluate for pulmonary hypertension  Bioprosthetic aortic valve with paravalvular leak -stable, mean gradient 19 mmHg -known moderate AI perivalvular previously, not well seen on most recent echo  Complete heart block s/p DC PPM -v paced 98% of the time. This may be the etiology of her cardiomyopathy. Would consider CRT upgrade once stable  Total time of encounter: I spent 50 minutes dedicated to the care of this patient on the date of this encounter to include  review of records, face-to-face time with the patient discussing conditions above, and clinical documentation with the electronic health record. We specifically spent time today discussing cath options, trialing lying flat for several minutes, and arranging cardiac procedure for today with Dr. Kirke Corin.  Jodelle Red, MD, PhD, [facility]  [facility]  [place]  Heart & Vascular at [facility] at [facility] [address] [place], [place] [address] [phone]     Signed, Jodelle Red, MD  04/17/2023, 10:12 AM

## 2023-04-17 NOTE — Plan of Care (Signed)
FMTS Interim Progress Note  Patient assessed at bedside after RHC.  Doing well, eating pineapple off her plate.  Denies chest pain or shortness of breath.  No acute concerns.  Spoke with daughter Waynetta Sandy on the phone and provided update on patient's care plan.  All questions answered and patient informed I spoke with her.   Elberta Fortis, MD 04/17/2023, 3:19 PM PGY-2, [facility] Family Medicine Service pager [phone]

## 2023-04-17 NOTE — Progress Notes (Addendum)
Daily Progress Note Intern Pager: [phone]  Patient name: Tonya Glass Medical record number: [id number] Date of birth: February 08, 1946 Age: 77 y.o. Gender: female  Primary Care Provider: No primary care provider on file. Consultants: Cardiology Code Status: DNR/DNI  Pt Overview and Major Events to Date:  11/27 - Admitted 12/1 - Hemoglobin <8, transfused 1 unit PRBC, Cr trending up 12/2 - Lasix gtt stopped, no further diuresis 12/4 - R heart cath   Assessment and Plan: Tonya Glass is a 77 y.o. female with a pertinent PMH of HFpEF, HLD, aortic stenosis s/p valve replacement, sick sinus syndrome, hypothyroidism, HTN, CKD stage IV, T2DM, and COPD on home oxygen 4L with exertion who presented with dyspnea and was admitted for CHF exacerbation, currently complicated by creatinine elevation.  Patient will undergo right heart catheterization today with cardiology to evaluate volume status and determine further diuresis needs.  Unclear if overdiuresed or still volume overloaded, difficult to evaluate on imaging and physical exam, though does appear euvolemic.  Creatinine remains stably elevated and overdiuresis as a possible culprit, no evidence of volume retention and renal ultrasound without hydronephrosis.  In terms of disposition, PT is recommending SNF, patient planning to return to [facility] where he has resided at ALF for past 2 months.  TOC working on prior [place]. Assessment & Plan Acute exacerbation of CHF (congestive heart failure) [facility]) Cardiology concern for overdiuresis and focusing on afterload reduction, no further Lasix at this time.  Right heart cath today in setting of elevated creatinine to evaluate PCWP and volume status.  Appears euvolemic on exam.  Still net negative 8.5 L since admission. -Daily weights, strict I&Os, messaged RN to verify -PT/OT evaluate and treat -Out of bed order with meals -Consult RD given poor p.o. intake and nutrition, follow up  recs -Cardiology following, recs appreciated   -Hold Lasix drip to allow for renovascular equilibration -Hydralazine 25 mg TID, Imdur 15 mg daily, carvedilol 3.125 mg BID -Follow up recs after RHC Acute kidney injury 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) Cr up to 2.39 and gradually trending up, former baseline ~1.5-1.7.  New baseline may be higher given possible progression of CKD.  Cardiology suspects overdiuresis.  No other clear etiology for creatinine rise.  Renal US without hydronephrosis, no acute changes. -Hold Jardiance and irbesartan for now -Daily bladder scan to ensure patient is not retaining urine -Daily BMP Acute on chronic hypoxic respiratory failure (HCC) Possible HAP given CXR and increased oxygen requirement.  On 2 L O2 this morning; appears to require 4 L with exertion at baseline but no oxygen when at rest normally.  Will plan to wean O2 after heart cath.  Wheezing resolved after DuoNeb. -Continue linezolid 600 mg every 12 hours for possible HAP (day 4 of 5) -Requested administration of PRN DuoNeb this AM Anemia Hgb stable at 9.5.  S/p 1 unit PRBC on 12/1.  Recommend outpatient colonoscopy.  Low concern for GI bleed, will descalate pantoprazole to once daily.  Is already on Carafate TID. -Continue home iron 325 mg daily -Transfuse <8.0 Hgb given she has has had significant UOP with diuresis -Pantoprazole 40 mg to once daily from BID -Trend daily hemoglobin on CBC LV thrombus Seen on echo 11/28. -Monitor fever curve -Continue heparin drip -Cardiology planning for L/R heart cath to evaluate for coronary artery embolism when clinically stable COPD (chronic obstructive pulmonary disease) (HCC) Low concern for acute exacerbation.  Patient has now been adherent with BiPAP overnight, did receive  DuoNeb once yesterday for wheezing. -Wean O2 as able with goal O2 88-92%, home baseline 4 L with exertion -DuoNebs every 4 hours PRN -Continue Dulera 2 puffs BID (on  Symbicort formulary equivalent) -Continue guaifenesin 600 mg BID -Nightly BiPAP DM2 (diabetes mellitus, type 2) (HCC) A1c 6.0.  CBG in last 24 hours have remained appropriate.  9 units SAI in past 24 hours. -Continue sensitive SSI -Start 4 units Semglee starting tomorrow -Hold home Jardiance 10 mg in setting of AKI -Hold home glipizide 10 mg BID Paroxysmal atrial flutter (HCC) LV apical thrombus noted on TTE 11/28. -Hold home Eliquis 5mg  BID -Currently on heparin drip for LV apical thrombus -AM BMP, mag; goal K>4, mag>2 Constipation Last BM today, 12/4. -Continue bowel regimen with MiraLAX and senna  Chronic and Stable Problems: Hypothyroidism: Continue levothyroxine 75 mcg daily Depression: Continue fluoxetine 40 mg daily, decreased trazodone to 25 mg nightly given somnolence Peripheral neuropathy: Continue gabapentin 600 mg nightly HLD: Continue home atorvastatin 40 mg daily Wounds to right leg: Wound care following continue compression wraps bilateral legs GERD: Continue home carafate 1 g TID, descalate to pantoprazole 40 mg ONCE daily  FEN/GI: Heart healthy diet after RHC PPx: Heparin TID Dispo: SNF pending clinical improvement . Barriers include creatinine and heart cath.  Subjective:  Patient did wear BiPAP overnight.  This morning, she reports she is feeling well and is not short of breath.  Objective: Temp:  [97.4 F (36.3 C)-98.6 F (37 C)] 97.6 F (36.4 C) (12/04 0750) Pulse Rate:  [70-84] 84 (12/04 0750) Resp:  [16-20] 16 (12/04 0750) BP: (119-144)/(39-61) 142/58 (12/04 0750) SpO2:  [95 %-100 %] 100 % (12/04 0750) FiO2 (%):  [32 %] 32 % (12/03 2350)  Physical Exam: General: Age-appropriate, resting comfortably in bed, NAD, alert when awoken. Cardiovascular: Regular rate and rhythm. Normal S1/S2. No murmurs, rubs, or gallops appreciated. 2+ radial pulses. Pulmonary: Clear bilaterally to ascultation, no wheezing. No increased WOB, no accessory muscle usage on 2  L [place]. Abdominal: Normoactive bowel sounds, nondistended. No tenderness to deep or light palpation. Skin: Warm and dry. Extremities: Trace dependent peripheral edema bilaterally.  Chronic venous stasis changes over BLE.  Laboratory: Most recent CBC Lab Results  Component Value Date   WBC 10.2 04/17/2023   HGB 9.5 (L) 04/17/2023   HCT 30.8 (L) 04/17/2023   MCV 92.8 04/17/2023   PLT 269 04/17/2023   Most recent BMP    Latest Ref Rng & Units 04/17/2023    3:06 AM  BMP  Glucose 70 - 99 mg/dL 846   BUN 8 - 23 mg/dL 46   Creatinine 9.62 - 1.00 mg/dL 9.52   Sodium 841 - 324 mmol/L 137   Potassium 3.5 - 5.1 mmol/L 4.1   Chloride 98 - 111 mmol/L 93   CO2 22 - 32 mmol/L 32   Calcium 8.9 - 10.3 mg/dL 8.8     Other pertinent labs: -None  New Imaging/Diagnostic Tests: -Renal ultrasound normal  Wesleigh Markovic, MD 04/17/2023, 8:14 AM  PGY-1, [facility] FPTS Intern pager: [phone], text pages welcome Secure chat group [facility] Teaching Service

## 2023-04-17 NOTE — Assessment & Plan Note (Addendum)
LV apical thrombus noted on TTE 11/28. -Hold home Eliquis 5mg  BID -Currently on heparin drip for LV apical thrombus -AM BMP, mag; goal K>4, mag>2

## 2023-04-17 NOTE — Assessment & Plan Note (Addendum)
Cardiology concern for overdiuresis and focusing on afterload reduction, no further Lasix at this time.  Right heart cath today in setting of elevated creatinine to evaluate PCWP and volume status.  Appears euvolemic on exam.  Still net negative 8.5 L since admission. -Daily weights, strict I&Os, messaged RN to verify -PT/OT evaluate and treat -Out of bed order with meals -Consult RD given poor p.o. intake and nutrition, follow up recs -Cardiology following, recs appreciated   -Hold Lasix drip to allow for renovascular equilibration -Hydralazine 25 mg TID, Imdur 15 mg daily, carvedilol 3.125 mg BID -Follow up recs after RHC

## 2023-04-17 NOTE — Plan of Care (Signed)
  Problem: Education: Goal: Knowledge of General Education information will improve Description: Including pain rating scale, medication(s)/side effects and non-pharmacologic comfort measures Outcome: Progressing   Problem: Health Behavior/Discharge Planning: Goal: Ability to manage health-related needs will improve Outcome: Progressing   Problem: Clinical Measurements: Goal: Ability to maintain clinical measurements within normal limits will improve Outcome: Progressing Goal: Will remain free from infection Outcome: Progressing Goal: Diagnostic test results will improve Outcome: Progressing Goal: Respiratory complications will improve Outcome: Progressing Goal: Cardiovascular complication will be avoided Outcome: Progressing   Problem: Activity: Goal: Risk for activity intolerance will decrease Outcome: Progressing   Problem: Nutrition: Goal: Adequate nutrition will be maintained Outcome: Progressing   Problem: Coping: Goal: Level of anxiety will decrease Outcome: Progressing   Problem: Elimination: Goal: Will not experience complications related to bowel motility Outcome: Progressing Goal: Will not experience complications related to urinary retention Outcome: Progressing   Problem: Pain Management: Goal: General experience of comfort will improve Outcome: Progressing   Problem: Safety: Goal: Ability to remain free from injury will improve Outcome: Progressing   Problem: Skin Integrity: Goal: Risk for impaired skin integrity will decrease Outcome: Progressing   Problem: Education: Goal: Ability to describe self-care measures that may prevent or decrease complications (Diabetes Survival Skills Education) will improve Outcome: Progressing Goal: Individualized Educational Video(s) Outcome: Progressing   Problem: Coping: Goal: Ability to adjust to condition or change in health will improve Outcome: Progressing   Problem: Fluid Volume: Goal: Ability to  maintain a balanced intake and output will improve Outcome: Progressing   Problem: Health Behavior/Discharge Planning: Goal: Ability to identify and utilize available resources and services will improve Outcome: Progressing Goal: Ability to manage health-related needs will improve Outcome: Progressing   Problem: Metabolic: Goal: Ability to maintain appropriate glucose levels will improve Outcome: Progressing   Problem: Nutritional: Goal: Maintenance of adequate nutrition will improve Outcome: Progressing Goal: Progress toward achieving an optimal weight will improve Outcome: Progressing   Problem: Skin Integrity: Goal: Risk for impaired skin integrity will decrease Outcome: Progressing   Problem: Tissue Perfusion: Goal: Adequacy of tissue perfusion will improve Outcome: Progressing   Problem: Education: Goal: Understanding of CV disease, CV risk reduction, and recovery process will improve Outcome: Progressing Goal: Individualized Educational Video(s) Outcome: Progressing   Problem: Activity: Goal: Ability to return to baseline activity level will improve Outcome: Progressing   Problem: Cardiovascular: Goal: Ability to achieve and maintain adequate cardiovascular perfusion will improve Outcome: Progressing Goal: Vascular access site(s) Level 0-1 will be maintained Outcome: Progressing   Problem: Health Behavior/Discharge Planning: Goal: Ability to safely manage health-related needs after discharge will improve Outcome: Progressing

## 2023-04-17 NOTE — Assessment & Plan Note (Addendum)
Cr up to 2.39 and gradually trending up, former baseline ~1.5-1.7.  New baseline may be higher given possible progression of CKD.  Cardiology suspects overdiuresis.  No other clear etiology for creatinine rise.  Renal US without hydronephrosis, no acute changes. -Hold Jardiance and irbesartan for now -Daily bladder scan to ensure patient is not retaining urine -Daily BMP

## 2023-04-17 NOTE — Interval H&P Note (Signed)
History and Physical Interval Note:  04/17/2023 11:44 AM  Tonya Glass  has presented today for surgery, with the diagnosis of heart failure.  The various methods of treatment have been discussed with the patient and family. After consideration of risks, benefits and other options for treatment, the patient has consented to  Procedure(s): RIGHT HEART CATH (N/A) as a surgical intervention.  The patient's history has been reviewed, patient examined, no change in status, stable for surgery.  I have reviewed the patient's chart and labs.  Questions were answered to the patient's satisfaction.      Verne Carrow

## 2023-04-17 NOTE — Progress Notes (Signed)
PHARMACY - ANTICOAGULATION CONSULT NOTE  Pharmacy Consult for heparin Indication:  LV thrombus  No Known Allergies  Patient Measurements: Height: 5' (152.4 cm) Weight: 95.7 kg (210 lb 15.7 oz) IBW/kg (Calculated) : 45.5 Heparin Dosing Weight: 72kg  Vital Signs: Temp: 98.6 F (37 C) (12/04 0423) Temp Source: Axillary (12/04 0423) BP: 139/61 (12/04 0423) Pulse Rate: 76 (12/04 0423)  Labs: Recent Labs    04/15/23 0655 04/15/23 1605 04/16/23 0650 04/16/23 1518 04/17/23 0306  HGB 9.1*  --  9.7*  --  9.5*  HCT 28.8*  --  31.3*  --  30.8*  PLT 208  --  259  --  269  APTT 52*   < > 67* 60* 86*  HEPARINUNFRC 0.93*  --  0.72* 0.73* 0.96*  CREATININE 2.17*  --  2.26*  --  2.39*   < > = values in this interval not displayed.    Estimated Creatinine Clearance: 20.4 mL/min (A) (by C-G formula based on SCr of 2.39 mg/dL (H)).  Assessment: 53 yoF admitted with CHF and anemia. Pt with hx AF on apixaban PTA which was held for anemia 11/27, ECHO 11/28 with LV thrombus, pharmacy consulted to dose IV heparin.  12/3: Heparin level remains falsely elevated from recent DOAC use, but trending down. aPTT is therapeutic at 86s on 1200 units/hr. CBC improved following PRBC transfusion. No overt bleeding or complications noted, no problems with the infusion per RN. Will continue to dose based on APTT until levels correlate.   Goal of Therapy:  Heparin level 0.3-0.5 units/ml aPTT 66-85 seconds Monitor platelets by anticoagulation protocol: Yes   Plan:  Continue heparin infusion at 1250 units/hr Monitor daily heparin level, aPTT, and CBC Monitor for signs/symptoms bleeding Follow up transition to oral anticoagulation F/U post-cath   Thank you for involving pharmacy in this patient's care.  Jani Gravel, PharmD Clinical Pharmacist  04/17/2023 7:34 AM

## 2023-04-17 NOTE — Assessment & Plan Note (Addendum)
Hgb stable at 9.5.  S/p 1 unit PRBC on 12/1.  Recommend outpatient colonoscopy.  Low concern for GI bleed, will descalate pantoprazole to once daily.  Is already on Carafate TID. -Continue home iron 325 mg daily -Transfuse <8.0 Hgb given she has has had significant UOP with diuresis -Pantoprazole 40 mg to once daily from BID -Trend daily hemoglobin on CBC

## 2023-04-17 NOTE — Progress Notes (Signed)
PT Cancellation Note  Patient Details Name: Tonya Glass MRN: [id number] DOB: 07-10-1945   Cancelled Treatment:    Reason Eval/Treat Not Completed: (P) Patient at procedure or test/unavailable. Will plan to follow-up later as time permits.   Virgil Benedict, PT, DPT Acute Rehabilitation Services  Office: [phone]    Bettina Gavia 04/17/2023, 11:56 AM

## 2023-04-17 NOTE — Assessment & Plan Note (Addendum)
Seen on echo 11/28. -Monitor fever curve -Continue heparin drip -Cardiology planning for L/R heart cath to evaluate for coronary artery embolism when clinically stable

## 2023-04-17 NOTE — Progress Notes (Signed)
Physical Therapy Treatment Patient Details Name: Ramya Lingg MRN: [id number] DOB: 04-Jun-1945 Today's Date: 04/17/2023   History of Present Illness Maebelle Dimmick is 77 y.o. admitted for acute on chronic heart failure exacerbation. Small LV thrombus noted with AKI and anemia. Pt requiring intermittent bipap. S/p R heart cath 12/4. Pertinent PMH/PSH includes HFpEF, HLD, Aortic stenosis s/p valve replacement, Sick sinus syndrome, Hypothyroidism, HTN, CKD, COPD, T2DM.    PT Comments  Pt educated to not utilize her R UE for all functional mobility this date as pt just had heart cath with R radial access earlier today. Pt required modAx2 to roll, modA to transition supine to sit, maxAx2 to transition sit to supine, and min-modAx2 to transfer to stand using the stedy today. Her increased independence with transfers suggests improved activity tolerance, strength, and power through her legs. She remains limited by pain in her hips though. Pt became more pale in her face and seemed to have slower processing after sitting on the commode to have a BM for >5 min. Unable to get a BP at the time, but her BP seemed within her normal range once supine at end of session. Pt may benefit from her BP being monitored with positional changes. Will continue to follow acutely.    If plan is discharge home, recommend the following: Two people to help with walking and/or transfers;Two people to help with bathing/dressing/bathroom;Assistance with cooking/housework;Assist for transportation   Can travel by private vehicle     No  Equipment Recommendations  None recommended by PT    Recommendations for Other Services       Precautions / Restrictions Precautions Precautions: Fall Precaution Comments: R radial access heart cath 12/4 Restrictions Weight Bearing Restrictions: No     Mobility  Bed Mobility Overal bed mobility: Needs Assistance Bed Mobility: Supine to Sit, Sit to Supine, Rolling Rolling: Mod assist,  +2 for physical assistance, +2 for safety/equipment, Used rails   Supine to sit: HOB elevated, Used rails, Mod assist Sit to supine: Max assist, +2 for physical assistance, +2 for safety/equipment   General bed mobility comments: ModAx2 needed to rotate trunk and bring leg over other leg to rotate hips. Pt able to manage each leg off EOB when cued and initiate pushing through L UE to ascend trunk, but needed modA to ascend trunk and scoot hips to EOB using bed pad. MaxAx2 needed to manage trunk and legs back to supine. With all mobility, cued pt not to use her R UE s/p cath access earlier today.    Transfers Overall transfer level: Needs assistance Equipment used: Ambulation equipment used Transfers: Bed to chair/wheelchair/BSC, Sit to/from Stand Sit to Stand: Min assist, Mod assist, +2 safety/equipment, +2 physical assistance, From elevated surface           General transfer comment: Cued pt with all mobility not to use her R UE s/p cath access earlier today. Pt able to pull up on stedy from elevated EOB 1x, from stedy flaps 2x, and from low commode 1x with L UE and assistance at hips to extend and power up to stand. MinAx2 to stand from elevated surfaces, modAx2 from lower surfaces Transfer via Lift Equipment: Stedy  Ambulation/Gait               General Gait Details: unable   Stairs             Wheelchair Mobility     Tilt Bed    Modified Rankin (Stroke Patients Only)  Balance Overall balance assessment: Needs assistance Sitting-balance support: Bilateral upper extremity supported, Feet supported, Single extremity supported Sitting balance-Leahy Scale: Poor Sitting balance - Comments: UE support.   Standing balance support: Single extremity supported, During functional activity Standing balance-Leahy Scale: Poor Standing balance comment: L UE support and external physical assistance to stand with stedy                             Cognition Arousal: Alert Behavior During Therapy: WFL for tasks assessed/performed Overall Cognitive Status: Within Functional Limits for tasks assessed                                 General Comments: Slow to process at times after prolonged sitting up on commode        Exercises      General Comments General comments (skin integrity, edema, etc.): BP stable supine end of session, unable to get measure while up OOB      Pertinent Vitals/Pain Pain Assessment Pain Assessment: Faces Faces Pain Scale: Hurts little more Pain Location: hips Pain Descriptors / Indicators: Grimacing, Guarding, Sore, Moaning Pain Intervention(s): Monitored during session, Limited activity within patient's tolerance, Repositioned    Home Living                          Prior Function            PT Goals (current goals can now be found in the care plan section) Acute Rehab PT Goals Patient Stated Goal: get better PT Goal Formulation: With patient Time For Goal Achievement: 04/28/23 Potential to Achieve Goals: Fair Progress towards PT goals: Progressing toward goals    Frequency    Min 1X/week      PT Plan      Co-evaluation              AM-PAC PT "6 Clicks" Mobility   Outcome Measure  Help needed turning from your back to your side while in a flat bed without using bedrails?: Total Help needed moving from lying on your back to sitting on the side of a flat bed without using bedrails?: A Lot Help needed moving to and from a bed to a chair (including a wheelchair)?: Total Help needed standing up from a chair using your arms (e.g., wheelchair or bedside chair)?: Total Help needed to walk in hospital room?: Total Help needed climbing 3-5 steps with a railing? : Total 6 Click Score: 7    End of Session Equipment Utilized During Treatment: Oxygen Activity Tolerance: Patient limited by fatigue;Patient limited by pain Patient left: in bed;with call  bell/phone within reach;with bed alarm set Nurse Communication: Mobility status;Need for lift equipment PT Visit Diagnosis: Other abnormalities of gait and mobility (R26.89);Muscle weakness (generalized) (M62.81);Pain;Unsteadiness on feet (R26.81);Difficulty in walking, not elsewhere classified (R26.2) Pain - Right/Left:  (bilateral) Pain - part of body: Ankle and joints of foot;Leg;Knee;Hip     Time: 8119-1478 PT Time Calculation (min) (ACUTE ONLY): 37 min  Charges:    $Therapeutic Activity: 23-37 mins PT General Charges $$ ACUTE PT VISIT: 1 Visit                     Virgil Benedict, PT, DPT Acute Rehabilitation Services  Office: [phone]    Bettina Gavia 04/17/2023, 3:00 PM

## 2023-04-17 NOTE — Assessment & Plan Note (Addendum)
Possible HAP given CXR and increased oxygen requirement.  On 2 L O2 this morning; appears to require 4 L with exertion at baseline but no oxygen when at rest normally.  Will plan to wean O2 after heart cath.  Wheezing resolved after DuoNeb. -Continue linezolid 600 mg every 12 hours for possible HAP (day 4 of 5) -Requested administration of PRN DuoNeb this AM

## 2023-04-17 NOTE — Plan of Care (Signed)

## 2023-04-17 NOTE — Assessment & Plan Note (Addendum)
Low concern for acute exacerbation.  Patient has now been adherent with BiPAP overnight, did receive DuoNeb once yesterday for wheezing. -Wean O2 as able with goal O2 88-92%, home baseline 4 L with exertion -DuoNebs every 4 hours PRN -Continue Dulera 2 puffs BID (on Symbicort formulary equivalent) -Continue guaifenesin 600 mg BID -Nightly BiPAP

## 2023-04-17 NOTE — Progress Notes (Signed)
   04/17/23 2158  BiPAP/CPAP/SIPAP  BiPAP/CPAP/SIPAP Pt Type Adult  BiPAP/CPAP/SIPAP V60  Mask Type Full face mask  Mask Size Small  Set Rate 10 breaths/min  Respiratory Rate 18 breaths/min  IPAP 12 cmH20  EPAP 5 cmH2O  FiO2 (%) 30 %  Minute Ventilation 7.1  Leak 0  Peak Inspiratory Pressure (PIP) 12  Tidal Volume (Vt) 401  Patient Home Equipment No  Auto Titrate No  Press High Alarm 25 cmH2O  Press Low Alarm 5 cmH2O  Oxygen Percent 30 %  BiPAP/CPAP /SiPAP Vitals  Pulse Rate 83  Resp 18  SpO2 96 %

## 2023-04-18 DIAGNOSIS — R0602 Shortness of breath: Secondary | ICD-10-CM | POA: Diagnosis not present

## 2023-04-18 DIAGNOSIS — N184 Chronic kidney disease, stage 4 (severe): Secondary | ICD-10-CM | POA: Diagnosis not present

## 2023-04-18 DIAGNOSIS — N179 Acute kidney failure, unspecified: Secondary | ICD-10-CM | POA: Diagnosis not present

## 2023-04-18 DIAGNOSIS — I5043 Acute on chronic combined systolic (congestive) and diastolic (congestive) heart failure: Secondary | ICD-10-CM | POA: Diagnosis not present

## 2023-04-18 LAB — BASIC METABOLIC PANEL
Anion gap: 15 (ref 5–15)
BUN: 54 mg/dL — ABNORMAL HIGH (ref 8–23)
CO2: 30 mmol/L (ref 22–32)
Calcium: 8.8 mg/dL — ABNORMAL LOW (ref 8.9–10.3)
Chloride: 95 mmol/L — ABNORMAL LOW (ref 98–111)
Creatinine, Ser: 2.52 mg/dL — ABNORMAL HIGH (ref 0.44–1.00)
GFR, Estimated: 19 mL/min — ABNORMAL LOW (ref >60)
Glucose, Bld: 172 mg/dL — ABNORMAL HIGH (ref 70–99)
Potassium: 4.9 mmol/L (ref 3.5–5.1)
Sodium: 140 mmol/L (ref 135–145)

## 2023-04-18 LAB — GLUCOSE, CAPILLARY
Glucose-Capillary: 163 mg/dL — ABNORMAL HIGH (ref 70–99)
Glucose-Capillary: 149 mg/dL — ABNORMAL HIGH (ref 70–99)
Glucose-Capillary: 204 mg/dL — ABNORMAL HIGH (ref 70–99)
Glucose-Capillary: 180 mg/dL — ABNORMAL HIGH (ref 70–99)
Glucose-Capillary: 162 mg/dL — ABNORMAL HIGH (ref 70–99)

## 2023-04-18 LAB — URINALYSIS, ROUTINE W REFLEX MICROSCOPIC
Bilirubin Urine: NEGATIVE
Glucose, UA: NEGATIVE mg/dL
Hgb urine dipstick: NEGATIVE
Ketones, ur: 5 mg/dL — AB
Leukocytes,Ua: NEGATIVE
Nitrite: NEGATIVE
Protein, ur: 30 mg/dL — AB
Specific Gravity, Urine: 1.023 (ref 1.005–1.030)
pH: 6 (ref 5.0–8.0)

## 2023-04-18 LAB — CBC
HCT: 31.5 % — ABNORMAL LOW (ref 36.0–46.0)
Hemoglobin: 9.8 g/dL — ABNORMAL LOW (ref 12.0–15.0)
MCH: 29.6 pg (ref 26.0–34.0)
MCHC: 31.1 g/dL (ref 30.0–36.0)
MCV: 95.2 fL (ref 80.0–100.0)
Platelets: 272 10*3/uL (ref 150–400)
RBC: 3.31 MIL/uL — ABNORMAL LOW (ref 3.87–5.11)
RDW: 15.2 % (ref 11.5–15.5)
WBC: 11.4 10*3/uL — ABNORMAL HIGH (ref 4.0–10.5)
nRBC: 0 % (ref 0.0–0.2)

## 2023-04-18 LAB — SODIUM, URINE, RANDOM: Sodium, Ur: 29 mmol/L

## 2023-04-18 LAB — MAGNESIUM: Magnesium: 2.7 mg/dL — ABNORMAL HIGH (ref 1.7–2.4)

## 2023-04-18 LAB — CREATININE, URINE, RANDOM: Creatinine, Urine: 166 mg/dL

## 2023-04-18 NOTE — Plan of Care (Signed)
  Problem: Education: Goal: Knowledge of General Education information will improve Description: Including pain rating scale, medication(s)/side effects and non-pharmacologic comfort measures Outcome: Progressing   Problem: Health Behavior/Discharge Planning: Goal: Ability to manage health-related needs will improve Outcome: Progressing   Problem: Clinical Measurements: Goal: Ability to maintain clinical measurements within normal limits will improve Outcome: Progressing Goal: Will remain free from infection Outcome: Progressing Goal: Diagnostic test results will improve Outcome: Progressing Goal: Respiratory complications will improve Outcome: Progressing Goal: Cardiovascular complication will be avoided Outcome: Progressing   Problem: Activity: Goal: Risk for activity intolerance will decrease Outcome: Progressing   Problem: Nutrition: Goal: Adequate nutrition will be maintained Outcome: Progressing   Problem: Coping: Goal: Level of anxiety will decrease Outcome: Progressing   Problem: Elimination: Goal: Will not experience complications related to bowel motility Outcome: Progressing Goal: Will not experience complications related to urinary retention Outcome: Progressing   Problem: Pain Management: Goal: General experience of comfort will improve Outcome: Progressing   Problem: Safety: Goal: Ability to remain free from injury will improve Outcome: Progressing   Problem: Skin Integrity: Goal: Risk for impaired skin integrity will decrease Outcome: Progressing   Problem: Education: Goal: Ability to describe self-care measures that may prevent or decrease complications (Diabetes Survival Skills Education) will improve Outcome: Progressing Goal: Individualized Educational Video(s) Outcome: Progressing   Problem: Coping: Goal: Ability to adjust to condition or change in health will improve Outcome: Progressing   Problem: Fluid Volume: Goal: Ability to  maintain a balanced intake and output will improve Outcome: Progressing   Problem: Health Behavior/Discharge Planning: Goal: Ability to identify and utilize available resources and services will improve Outcome: Progressing Goal: Ability to manage health-related needs will improve Outcome: Progressing   Problem: Metabolic: Goal: Ability to maintain appropriate glucose levels will improve Outcome: Progressing   Problem: Nutritional: Goal: Maintenance of adequate nutrition will improve Outcome: Progressing Goal: Progress toward achieving an optimal weight will improve Outcome: Progressing   Problem: Skin Integrity: Goal: Risk for impaired skin integrity will decrease Outcome: Progressing   Problem: Tissue Perfusion: Goal: Adequacy of tissue perfusion will improve Outcome: Progressing   Problem: Education: Goal: Understanding of CV disease, CV risk reduction, and recovery process will improve Outcome: Progressing Goal: Individualized Educational Video(s) Outcome: Progressing   Problem: Activity: Goal: Ability to return to baseline activity level will improve Outcome: Progressing   Problem: Cardiovascular: Goal: Ability to achieve and maintain adequate cardiovascular perfusion will improve Outcome: Progressing Goal: Vascular access site(s) Level 0-1 will be maintained Outcome: Progressing   Problem: Health Behavior/Discharge Planning: Goal: Ability to safely manage health-related needs after discharge will improve Outcome: Progressing

## 2023-04-18 NOTE — Plan of Care (Signed)
Problem: Education: Goal: Knowledge of General Education information will improve Description: Including pain rating scale, medication(s)/side effects and non-pharmacologic comfort measures 04/18/2023 1558 by Lance Bosch, RN Outcome: Progressing 04/18/2023 1544 by Lance Bosch, RN Outcome: Progressing   Problem: Health Behavior/Discharge Planning: Goal: Ability to manage health-related needs will improve 04/18/2023 1558 by Lance Bosch, RN Outcome: Progressing 04/18/2023 1544 by Lance Bosch, RN Outcome: Progressing   Problem: Clinical Measurements: Goal: Ability to maintain clinical measurements within normal limits will improve 04/18/2023 1558 by Lance Bosch, RN Outcome: Progressing 04/18/2023 1544 by Lance Bosch, RN Outcome: Progressing Goal: Will remain free from infection 04/18/2023 1558 by Lance Bosch, RN Outcome: Progressing 04/18/2023 1544 by Lance Bosch, RN Outcome: Progressing Goal: Diagnostic test results will improve 04/18/2023 1558 by Lance Bosch, RN Outcome: Progressing 04/18/2023 1544 by Lance Bosch, RN Outcome: Progressing Goal: Respiratory complications will improve 04/18/2023 1558 by Lance Bosch, RN Outcome: Progressing 04/18/2023 1544 by Lance Bosch, RN Outcome: Progressing Goal: Cardiovascular complication will be avoided 04/18/2023 1558 by Lance Bosch, RN Outcome: Progressing 04/18/2023 1544 by Lance Bosch, RN Outcome: Progressing   Problem: Activity: Goal: Risk for activity intolerance will decrease 04/18/2023 1558 by Lance Bosch, RN Outcome: Progressing 04/18/2023 1544 by Lance Bosch, RN Outcome: Progressing   Problem: Nutrition: Goal: Adequate nutrition will be maintained 04/18/2023 1558 by Lance Bosch, RN Outcome: Progressing 04/18/2023 1544 by Lance Bosch, RN Outcome: Progressing   Problem: Coping: Goal: Level of anxiety will decrease 04/18/2023  1558 by Lance Bosch, RN Outcome: Progressing 04/18/2023 1544 by Lance Bosch, RN Outcome: Progressing   Problem: Elimination: Goal: Will not experience complications related to bowel motility 04/18/2023 1558 by Lance Bosch, RN Outcome: Progressing 04/18/2023 1544 by Lance Bosch, RN Outcome: Progressing Goal: Will not experience complications related to urinary retention 04/18/2023 1558 by Lance Bosch, RN Outcome: Progressing 04/18/2023 1544 by Lance Bosch, RN Outcome: Progressing   Problem: Pain Management: Goal: General experience of comfort will improve 04/18/2023 1558 by Lance Bosch, RN Outcome: Progressing 04/18/2023 1544 by Lance Bosch, RN Outcome: Progressing   Problem: Safety: Goal: Ability to remain free from injury will improve 04/18/2023 1558 by Lance Bosch, RN Outcome: Progressing 04/18/2023 1544 by Lance Bosch, RN Outcome: Progressing   Problem: Skin Integrity: Goal: Risk for impaired skin integrity will decrease 04/18/2023 1558 by Lance Bosch, RN Outcome: Progressing 04/18/2023 1544 by Lance Bosch, RN Outcome: Progressing   Problem: Education: Goal: Ability to describe self-care measures that may prevent or decrease complications (Diabetes Survival Skills Education) will improve 04/18/2023 1558 by Lance Bosch, RN Outcome: Progressing 04/18/2023 1544 by Lance Bosch, RN Outcome: Progressing Goal: Individualized Educational Video(s) 04/18/2023 1558 by Lance Bosch, RN Outcome: Progressing 04/18/2023 1544 by Lance Bosch, RN Outcome: Progressing   Problem: Coping: Goal: Ability to adjust to condition or change in health will improve 04/18/2023 1558 by Lance Bosch, RN Outcome: Progressing 04/18/2023 1544 by Lance Bosch, RN Outcome: Progressing   Problem: Fluid Volume: Goal: Ability to maintain a balanced intake and output will improve 04/18/2023 1558 by Lance Bosch, RN Outcome: Progressing 04/18/2023 1544 by Lance Bosch, RN Outcome: Progressing   Problem: Health Behavior/Discharge Planning: Goal: Ability to identify and utilize available resources and services will improve 04/18/2023 1558 by Lance Bosch, RN Outcome: Progressing 04/18/2023 1544 by Lance Bosch,  RN Outcome: Progressing Goal: Ability to manage health-related needs will improve 04/18/2023 1558 by Lance Bosch, RN Outcome: Progressing 04/18/2023 1544 by Lance Bosch, RN Outcome: Progressing   Problem: Metabolic: Goal: Ability to maintain appropriate glucose levels will improve 04/18/2023 1558 by Lance Bosch, RN Outcome: Progressing 04/18/2023 1544 by Lance Bosch, RN Outcome: Progressing   Problem: Nutritional: Goal: Maintenance of adequate nutrition will improve 04/18/2023 1558 by Lance Bosch, RN Outcome: Progressing 04/18/2023 1544 by Lance Bosch, RN Outcome: Progressing Goal: Progress toward achieving an optimal weight will improve 04/18/2023 1558 by Lance Bosch, RN Outcome: Progressing 04/18/2023 1544 by Lance Bosch, RN Outcome: Progressing   Problem: Skin Integrity: Goal: Risk for impaired skin integrity will decrease 04/18/2023 1558 by Lance Bosch, RN Outcome: Progressing 04/18/2023 1544 by Lance Bosch, RN Outcome: Progressing   Problem: Tissue Perfusion: Goal: Adequacy of tissue perfusion will improve 04/18/2023 1558 by Lance Bosch, RN Outcome: Progressing 04/18/2023 1544 by Lance Bosch, RN Outcome: Progressing   Problem: Education: Goal: Understanding of CV disease, CV risk reduction, and recovery process will improve 04/18/2023 1558 by Lance Bosch, RN Outcome: Progressing 04/18/2023 1544 by Lance Bosch, RN Outcome: Progressing Goal: Individualized Educational Video(s) 04/18/2023 1558 by Lance Bosch, RN Outcome: Progressing 04/18/2023 1544 by Lance Bosch, RN Outcome: Progressing   Problem: Activity: Goal: Ability to return to baseline activity level will improve 04/18/2023 1558 by Lance Bosch, RN Outcome: Progressing 04/18/2023 1544 by Lance Bosch, RN Outcome: Progressing   Problem: Cardiovascular: Goal: Ability to achieve and maintain adequate cardiovascular perfusion will improve 04/18/2023 1558 by Lance Bosch, RN Outcome: Progressing 04/18/2023 1544 by Lance Bosch, RN Outcome: Progressing Goal: Vascular access site(s) Level 0-1 will be maintained 04/18/2023 1558 by Lance Bosch, RN Outcome: Progressing 04/18/2023 1544 by Lance Bosch, RN Outcome: Progressing   Problem: Health Behavior/Discharge Planning: Goal: Ability to safely manage health-related needs after discharge will improve 04/18/2023 1558 by Lance Bosch, RN Outcome: Progressing 04/18/2023 1544 by Lance Bosch, RN Outcome: Progressing

## 2023-04-18 NOTE — Assessment & Plan Note (Signed)
Seen on echo 11/28. -Monitor fever curve -Continue heparin drip -Cardiology planning for L/R heart cath to evaluate for coronary artery embolism when clinically stable

## 2023-04-18 NOTE — Assessment & Plan Note (Addendum)
Cr up to 2.52 and gradually trending up, former baseline ~1.5-1.7.  New baseline may be higher given possible progression of CKD.  Cardiology suspects overdiuresis, furthermore FeNa 0.3 today and favoring prerenal etiology. -Hold Jardiance and irbesartan for now -Nephrology consulted, may need some gentle volume replation -Daily bladder scan to ensure patient is not retaining urine -Daily BMP

## 2023-04-18 NOTE — Assessment & Plan Note (Addendum)
Saturations now remaining >88% on room air, does desaturate when when she drifts off to sleep, suspect patient require oxygen at night for home.  Really requires BiPAP when sleeping. -Finished 5 days of linezolid 600 mg today -DuoNeb Q6h PRN

## 2023-04-18 NOTE — Progress Notes (Addendum)
Patient seen and examined, note reviewed with the signed Advanced Practice Provider. I personally reviewed laboratory data, imaging studies and relevant notes. I independently examined the patient and formulated the important aspects of the plan. I have personally discussed the plan with the patient and/or family. Comments or changes to the note/plan are indicated below.  Reviewed results of her RHC, which showed normal right sided pressures. She has now been seen by nephrology, who recommended holding diuretics today but likely needing to restart prior to admission. Exam unchanged from yesterday.  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 Normal RV, RVSP 19 mmhg, RAP 3 mmHg -RHC with normal right sided filling pressures. On hydralazine and imdur for afterload reduction. Does not suggest significant pulmonary hypertension, which had been noted previously -baseline uses 4L [place] O2 at home. -suggests significant LE edema is likely not cardiac in etiology, likely venous insufficiency. Has history of stasis ulcers as well   With her renal function, will not pursue cath at this time. Could consider cath as an outpatient if renal function significantly improves, vs. Cardiac PET to evaluate for ischemia  LV apical thrombus Paroxysmal atrial flutter -continue DOAC -CHA2DS2/VAS Stroke Risk Points=9  -cautious given anemia , s/p transfusion 04/14/23   Bioprosthetic aortic valve with paravalvular leak -stable, mean gradient 19 mmHg -known moderate AI perivalvular previously, not well seen on most recent echo   Complete heart block s/p DC PPM -v paced 98% of the time. This may be the etiology of her cardiomyopathy. Would consider CRT upgrade once stable  Given that  nephrology is now following and can manage diuretics, cardiology will sign off.  [facility] will sign off.   Medication Recommendations:  continue apixaban, atorvastatin, carvedilol, hydralazine, imdur as currently ordered. Diuretics per nephrology Other recommendations (labs, testing, etc):  consider CRT, cardiac PET as outpatient Follow up as an outpatient:  Has follow up with Bernadene Person on 05/03/23    Jodelle Red, MD, PhD, [facility]  [facility]    Heart & Vascular at [facility] [address], [address] [place], [place] [address] [phone]           Progress Note  Patient Name: Tonya Glass Date of Encounter: 04/18/2023  Primary Cardiologist: Chrystie Nose, MD  Subjective   She is overall feeling much better. Denies SOB. Still with some edema on board. A+O to [facility], year.  Inpatient Medications    Scheduled Meds:  apixaban  5 mg Oral BID   atorvastatin  40 mg Oral Daily   carvedilol  3.125 mg Oral BID WC   Chlorhexidine Gluconate Cloth  6 each Topical Q0600   ferrous sulfate  325 mg Oral Q1200   FLUoxetine  40 mg Oral Daily   gabapentin  600 mg Oral QHS   hydrALAZINE  25 mg Oral Q8H   insulin aspart  0-9 Units Subcutaneous TID WC   insulin glargine-yfgn  4 Units Subcutaneous Daily   isosorbide mononitrate  15 mg Oral Daily   levothyroxine  75 mcg Oral Daily   linezolid  600 mg Oral Q12H   mometasone-formoterol  2 puff Inhalation BID   multivitamin with minerals  1 tablet Oral Daily   mupirocin ointment  1 Application Nasal BID   pantoprazole  40 mg Oral Daily   polyethylene glycol  17 g Oral BID   senna  1 tablet Oral Daily   sodium chloride flush  3 mL Intravenous Q12H   sucralfate  1 g Oral TID WC & HS   traZODone  25 mg Oral QHS   umeclidinium bromide  1 puff Inhalation Daily   Continuous Infusions:  sodium chloride     ceFEPime (MAXIPIME) IV 2 g (04/17/23 1405)    PRN Meds: sodium chloride, acetaminophen **OR** acetaminophen, Gerhardt's butt cream, ipratropium-albuterol, sodium chloride flush   Vital Signs    Vitals:   04/18/23 0357 04/18/23 0802 04/18/23 0838 04/18/23 0842  BP: (!) 123/48 (!) 134/33 122/64   Pulse:  81 90   Resp: 18 20  16   Temp: 97.8 F (36.6 C) (!) 97.5 F (36.4 C)    TempSrc: Oral Oral    SpO2:  96% 93%   Weight: 96.7 kg     Height:        Intake/Output Summary (Last 24 hours) at 04/18/2023 1040 Last data filed at 04/18/2023 0856 Gross per 24 hour  Intake 230 ml  Output 400 ml  Net -170 ml      04/18/2023    3:57 AM 04/17/2023    3:00 PM 04/16/2023    4:37 AM  Last 3 Weights  Weight (lbs) 213 lb 3 oz 213 lb 13.5 oz 210 lb 15.7 oz  Weight (kg) 96.7 kg 97 kg 95.7 kg     Telemetry    Atrial sensed V pacing - Personally Reviewed  Physical Exam   GEN: No acute distress.  HEENT: Normocephalic, atraumatic, sclera non-icteric. Neck: No JVD or bruits. Cardiac: RRR, soft SEM, no rubs or gallops.  Respiratory: Mildly decreased throughout but no acute wheezing, rhonchi or rales. Breathing is unlabored. GI: Soft, nontender, non-distended, BS +x 4. MS: no deformity but does have diffuse UE ecchymosis, states she bruises easily. RHC without complication. Extremities: No clubbing or cyanosis. Thickened hyperpigmentation skin changes of lower legs with associated edema with sockline edema Neuro:  AAOx3. Follows commands. Psych:  Responds to questions appropriately with a normal affect.  Labs    High Sensitivity Troponin:  No results for input(s): "TROPONINIHS" in the last 720 hours.    Cardiac EnzymesNo results for input(s): "TROPONINI" in the last 168 hours. No results for input(s): "TROPIPOC" in the last 168 hours.   Chemistry Recent Labs  Lab 04/16/23 0650 04/17/23 0306 04/17/23 1220 04/18/23 0238  NA 139 137 139  137 140  K 3.9 4.1 3.1*  3.3* 4.9  CL 91* 93*  --  95*  CO2 33* 32  --  30  GLUCOSE  193* 197*  --  172*  BUN 41* 46*  --  54*  CREATININE 2.26* 2.39*  --  2.52*  CALCIUM 8.9 8.8*  --  8.8*  GFRNONAA 22* 20*  --  19*  ANIONGAP 15 12  --  15     Hematology Recent Labs  Lab 04/16/23 0650 04/17/23 0306 04/17/23 1220 04/18/23 0238  WBC 9.7 10.2  --  11.4*  RBC 3.34* 3.32*  --  3.31*  HGB 9.7* 9.5* 10.2*  10.9* 9.8*  HCT 31.3* 30.8* 30.0*  32.0* 31.5*  MCV 93.7 92.8  --  95.2  MCH 29.0 28.6  --  29.6  MCHC 31.0 30.8  --  31.1  RDW 15.0 15.0  --  15.2  PLT 259 269  --  272    BNPNo  results for input(s): "BNP", "PROBNP" in the last 168 hours.   DDimer No results for input(s): "DDIMER" in the last 168 hours.   Radiology    CARDIAC CATHETERIZATION  Result Date: 04/17/2023 Overall normal right heart pressures. Mild elevation of PA pressure (mean 27 mmHg) PCWP 12 mmHg CO 5.6 L/min CI 2.94.   US RENAL  Result Date: 04/17/2023 CLINICAL DATA:  Dehydration. EXAM: RENAL / URINARY TRACT ULTRASOUND COMPLETE COMPARISON:  CT, 07/06/2021 FINDINGS: Right Kidney: Renal measurements: 8.9 x 4.0 x 5.4 cm. = volume: 100.1 mL. Normal parenchymal echogenicity. Mild cortical thinning. No renal mass or stone. No hydronephrosis. Left Kidney: Renal measurements: 8.7 x 4.6 x 5.1 cm = volume: 107.2 mL. Normal parenchymal echogenicity. Mild cortical thinning. No renal mass or stone. No hydronephrosis. Bladder: Appears normal for degree of bladder distention. Other: None. IMPRESSION: 1. No acute findings.  No hydronephrosis. 2. Decreased renal sizes with bilateral renal cortical thinning. Appearance unchanged from the prior CTA chest, abdomen and pelvis. Electronically Signed   By: Amie Portland M.D.   On: 04/17/2023 10:48    Cardiac Studies   RHC 04/17/23 Overall normal right heart pressures.  Mild elevation of PA pressure (mean 27 mmHg) PCWP 12 mmHg CO 5.6 L/min CI 2.94.     Patient Profile     77 y.o. female with aortic root repair and bioprosthetic AVR c/w CHB s/p [employer] dual-chamber PPM in 2014 and generator change in 2024, paroxysmal atrial flutter s/p prior overdrive pacing, nonobstructive CAD (40% mid LAD pre-AVR in 2014 and mild diffuse nonobstructive irregularity by angiography in 2023), chronic cor pulmonale, DM, CKD 3b, peripheral venous insufficiency, memory loss, morbid obesity with suspected OSA and refusal of sleep study. Presented to the hospital with worsening DOE, orthopnea, 30lb weight gain, admitted with acute HFrEF (EF 35-40% previously 55-60 in 05/2021), AKI on CKD, worsening anemia, and small LV thrombus. Primary team also treating for HAP.  Assessment & Plan    1. Acute HFrEF/new cardiomyopathy - current GDMT: carvedilol 3.125mg  BID, hydralazine 25mg  Q8Hr, Imdur 15mg  daily  - avoid ACEI/ARB/ARNI/spiro/SGLT2i acutely with AKI - s/p IV Lasix and metolazone much earlier in admission - RHC yesterday with overall normal right heart pressures, mild elevation of PA pressure mean , PCWP , CO 5.6L/Min, CI 2.94  2. LV apical thrombus - given new finding cannot definitively exclude underlying progression of CAD but not presently a good candidate for invasive evaluation. See extensive discussion of LCP from 04/16/23. Given renal function, will not pursue coronary angiography at this time. Anticipate medical therapy for now and outpatient follow-up - per d/w Dr. Cristal Deer, continue apixaban 5mg  BID, hold off acute thrombus dosing at this time  3. AKI on CKD stage 3b-4 - baseline Cr previously around 1.5-1.7, admitted with Cr 2.43, nadir 1.8 on 11/30 but up to 2.52 today - RHC as above - avoid nephrotoxic agents - recommend nephrology f/u as OP  4. New worsening anemia - previous Hgb 11 range, down to nadir 7.4, txf 1 u PRBC - primary team managing with low concern for GIB and consideration of outpatient colonoscopy, they are continuing home iron  5. H/o paroxysmal atrial flutter - transitioned back to [facility], need to follow anemia  closely as OP  6. Pulm HTN, COPD, suspected prior concern for OSA/OHS, morbid obesity - previously refused sleep study, would not use CPAP even if diagnosed - manage in context above - will need assessment of O2 needs at DC  7. H/o aortic  root repair and bioprosthetic AVR 2014 -stable, mean gradient 19 mmHg -known moderate AI perivalvular previously, not well seen on most recent echo  8. CHB s/p PPM -v paced 98% of the time, per Dr. Cristal Deer ?potentially contributing to cardiomyopathy, would consider CRT upgrade once stable   Has f/u with Bernadene Person Glass 12/20, outlined on AVS Will discuss dispo recs with MD - was on substantial dose of torsemide PTA  For questions or updates, please contact [facility] Please consult [email or web address] for contact info under Cardiology/STEMI.  Signed, Laurann Montana, PA-C 04/18/2023, 10:40 AM

## 2023-04-18 NOTE — Assessment & Plan Note (Addendum)
Hgb stable at 9.8.  S/p 1 unit PRBC on 12/1.  Recommend outpatient colonoscopy.  Low concern for GI bleed, will descalate pantoprazole to once daily.  Is already on Carafate TID. -Continue home iron 325 mg daily -Transfuse <8.0 Hgb given she has has had significant UOP with diuresis -Pantoprazole 40 mg to once daily from BID -Trend daily hemoglobin on CBC

## 2023-04-18 NOTE — Assessment & Plan Note (Signed)
LV apical thrombus noted on TTE 11/28. -Hold home Eliquis 5mg  BID -Currently on heparin drip for LV apical thrombus -AM BMP, mag; goal K>4, mag>2

## 2023-04-18 NOTE — Assessment & Plan Note (Addendum)
RHC showing normal volume status yesterday, no further diuresis at this time.  Still net negative 8.5 L since admission. -Daily weights, strict I&Os -PT/OT evaluate and treat -Out of bed order with meals -Consult RD given poor p.o. intake and nutrition, follow up recs -Cardiology following, recs appreciated   -Hold Lasix drip to allow for renovascular equilibration -GDMT: Hydralazine 25 mg TID, Imdur 15 mg daily, carvedilol 3.125 mg BID -Follow up recs after RHC

## 2023-04-18 NOTE — Assessment & Plan Note (Addendum)
A1c 6.0.  CBG in last 24 hours have remained appropriate. -Continue sensitive SSI -Start 4 units Semglee starting tomorrow -Hold home Jardiance 10 mg in setting of AKI -Hold home glipizide 10 mg BID

## 2023-04-18 NOTE — Consult Note (Signed)
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  Workup most consistent with acute on chronic CHF exacerbation.  TTE with new LVEF 35 to 40%.  She was diuresed with IV Lasix and weights have improved by 11 kg with accompanying improvement in her edema.  Respiratory status has improved and she is now on room air.  Diuretics have been held since 04/15/2023 and at that time she was on a parenteral infusion.  Trend in creatinine as below.  Creatinine, Ser (mg/dL)  Date Value  16/02/9603 2.52 (H)  04/17/2023 2.39 (H)  04/16/2023 2.26 (H)  04/15/2023 2.17 (H)  04/14/2023 2.01 (H)  04/13/2023 1.82 (H)  04/13/2023 2.15 (H)  04/12/2023 2.38 (H)  04/11/2023 2.58 (H)  04/10/2023 2.43 (H)   I have seen her 1 time about 18 months ago at [facility].  At that time she had CKD 3B thought to be due to her complicated cardiovascular disease, longstanding DM2 and hypertension.  Since admission her olmesartan and Jardiance have been held.  Renal ultrasound completed yesterday was without acute structural or obstructive findings.  Urine sodium this morning 29 off diuretics giving a FENa of 0.3%.  Urine analysis today with no hematuria, pyuria.  Trace proteinuria present.  She is normotensive.  She underwent right heart cath 12/4 with normal right-sided heart pressures, mild elevation of the PA pressure, and a wedge of 12 mmHg.  She has been placed on nitrates/hydralazine, carvedilol.  She is on apixaban for the LV thrombus.  She has no specific complaints at the  current time.  She gets winded if she moves around or lays down flat in the bed.  I/Os: I/O last 3 completed shifts: In: 350 [P.O.:350] Out: 490 [Urine:490]   ROS NSAIDS: No exposure IV Contrast no exposure TMP/SMX no exposure Hypotension no exposure Balance of 12 systems is negative w/ exceptions as above  PMH  Past Medical History:  Diagnosis Date   Anxiety    Aortic valve stenosis 10/10/2016   Arthritis    "fingers" (07/24/2017)   CHF (congestive heart failure) (HCC)    COPD (chronic obstructive pulmonary disease) (HCC)    Depression    Gallstones    Heart murmur    High cholesterol    History of kidney stones    Hypertension    Hypothyroidism    Migraines    "none in years til 01/2013-02/2013; none since"  (07/24/2017)   Orthopnoea    "just since open heart surgery" (02/25/2013)   Pacemaker    [employer]    Pneumonia ~ 2012   "walking pneumonia"   Pneumonia 07/23/2017   Pneumonia 07/23/2017   Stroke (HCC) 2001   denies residual on 02/25/2013   Symptomatic hypotension 10/10/2016   Syncope and collapse    "prior to open heart surgery" (02/25/2013)   Type II diabetes mellitus (HCC)    PSH  Past Surgical History:  Procedure Laterality Date   AORTIC ARCH ANGIOGRAPHY N/A 08/07/2021   Procedure: AORTIC ARCH ANGIOGRAPHY;  Surgeon: Tonny Bollman, MD;  Location: [facility] INVASIVE CV LAB;  Service: Cardiovascular;  Laterality: N/A;   BREAST  SURGERY Right    "clamped blood vessel and stitched it up; in [place]"   CARDIAC CATHETERIZATION     CARDIAC VALVE REPLACEMENT  01/26/2013   "cow valve put in; widened the second valve" (02/25/2013) - [place]   CATARACT EXTRACTION W/ INTRAOCULAR LENS  IMPLANT, BILATERAL Bilateral 04/2017   CHOLECYSTECTOMY OPEN  1971   CYST EXCISION     "taken off my chest"   INSERT / REPLACE / REMOVE PACEMAKER  01/19/2013   [employer] - dual chamber   LAPAROSCOPIC GASTRIC SLEEVE RESECTION  2009   in [place]   PPM GENERATOR CHANGEOUT  N/A 06/22/2022   Procedure: PPM GENERATOR CHANGEOUT;  Surgeon: Duke Salvia, MD;  Location: [facility] INVASIVE CV LAB;  Service: Cardiovascular;  Laterality: N/A;   RIGHT HEART CATH N/A 04/17/2023   Procedure: RIGHT HEART CATH;  Surgeon: Kathleene Hazel, MD;  Location: [facility] INVASIVE CV LAB;  Service: Cardiovascular;  Laterality: N/A;   RIGHT/LEFT HEART CATH AND CORONARY ANGIOGRAPHY N/A 08/07/2021   Procedure: RIGHT/LEFT HEART CATH AND CORONARY ANGIOGRAPHY;  Surgeon: Tonny Bollman, MD;  Location: [facility] INVASIVE CV LAB;  Service: Cardiovascular;  Laterality: N/A;   TEE WITHOUT CARDIOVERSION N/A 06/13/2021   Procedure: TRANSESOPHAGEAL ECHOCARDIOGRAM (TEE);  Surgeon: Thurmon Fair, MD;  Location: [facility] ENDOSCOPY;  Service: Cardiovascular;  Laterality: N/A;   VEIN SURGERY Left    LLE; cut leg open and took out piece of vein that was protruding thru skin; stitched it up; done in [place]   FH  Family History  Problem Relation Age of Onset   Lung cancer Mother    Heart Problems Father    Hypertension Brother    Colon polyps Brother    Skin cancer Brother    Asthma Brother    Colon polyps Daughter    Diabetes Maternal Aunt    Breast cancer Maternal Aunt        mets to liver   Diabetes Paternal Aunt    SH  reports that she quit smoking about 23 years ago. Her smoking use included cigarettes. She started smoking about 63 years ago. She has a 80 pack-year smoking history. She has never used smokeless tobacco. She reports that she does not drink alcohol and does not use drugs. Allergies No Known Allergies Home medications Prior to Admission medications   Medication Sig Start Date End Date Taking? Authorizing Provider  acetaminophen (TYLENOL) 650 MG CR tablet Take 650 mg by mouth every 8 (eight) hours as needed (Discomfort).   Yes [provider]  alendronate (FOSAMAX) 70 MG tablet Take 70 mg by mouth every Sunday. 07/13/17  Yes [provider]  apixaban (ELIQUIS) 5 MG TABS tablet Take 5  mg by mouth 2 (two) times daily.   Yes [provider]  atorvastatin (LIPITOR) 40 MG tablet Take 40 mg by mouth daily.   Yes [provider]  Benzethonium Chloride 0.13 % LIQD Apply 1 Application topically 2 (two) times a week. On Tuesdays and Fridays   Yes [provider]  budesonide-formoterol (SYMBICORT) 160-4.5 MCG/ACT inhaler Inhale 2 puffs into the lungs 2 (two) times daily.   Yes [provider]  carvedilol (COREG) 3.125 MG tablet Take 1 tablet (3.125 mg total) by mouth 2 (two) times daily. 06/23/22  Yes Jonita Albee, PA-C  cloNIDine (CATAPRES) 0.2 MG tablet Take 1 tablet (0.2 mg total) by mouth at bedtime. 07/05/21  Yes Hilty, Lisette Abu, MD  doxycycline (VIBRA-TABS) 100 MG tablet Take 100 mg by mouth  2 (two) times daily. 04/09/23  Yes [provider]  Emollient (AQUAPHOR ADV THERAPY HEALING) 41 % OINT Apply 1 Application topically See admin instructions. Apply 1 application topically every shift, apply to sacrum wound.   Yes [provider]  Ferrous Sulfate (IRON) 325 (65 Fe) MG TABS Take 325 mg by mouth daily. 08/13/17  Yes [provider]  FLUoxetine (PROZAC) 40 MG capsule Take 40 mg by mouth daily.    Yes [provider]  gabapentin (NEURONTIN) 300 MG capsule Take 600 mg by mouth at bedtime.   Yes [provider]  glipiZIDE (GLUCOTROL) 10 MG tablet Take 10 mg by mouth 2 (two) times daily before a meal.   Yes [provider]  guaiFENesin (MUCINEX) 600 MG 12 hr tablet Take 600 mg by mouth 2 (two) times daily.   Yes [provider]  HUMALOG KWIKPEN 100 UNIT/ML KwikPen Inject into the skin 2 (two) times daily. Sliding scale 03/11/23  Yes [provider]  ipratropium (ATROVENT) 0.06 % nasal spray Place 2 sprays into both nostrils 3 (three) times daily. Patient taking differently: Place 2 sprays into both nostrils every 8 (eight) hours as needed for rhinitis. 12/14/21  Yes Charlott Holler,  MD  JARDIANCE 10 MG TABS tablet Take 10 mg by mouth daily. 04/08/23  Yes [provider]  LANTUS SOLOSTAR 100 UNIT/ML Solostar Pen Inject 12 Units into the skin at bedtime. 04/02/23  Yes [provider]  levothyroxine (SYNTHROID) 75 MCG tablet Take 75 mcg by mouth daily. 03/22/23  Yes [provider]  magnesium oxide (MAG-OX) 400 (240 Mg) MG tablet Take 400 mg by mouth daily.   Yes [provider]  nystatin ointment (MYCOSTATIN) Apply 1 application  topically daily as needed (rash). 100,000- units/gram   Yes [provider]  olmesartan (BENICAR) 20 MG tablet Take 0.5 tablets (10 mg total) by mouth daily. Patient taking differently: Take 20 mg by mouth daily. 09/11/21  Yes Croitoru, Mihai, MD  pantoprazole (PROTONIX) 40 MG tablet Take 40 mg by mouth 2 (two) times daily. 03/26/23  Yes [provider]  PROAIR RESPICLICK 108 (90 Base) MCG/ACT AEPB Inhale 2 puffs into the lungs 4 (four) times daily as needed. 02/19/23  Yes [provider]  sodium chloride (OCEAN) 0.65 % SOLN nasal spray Place 2 sprays into both nostrils every 6 (six) hours as needed for congestion.   Yes [provider]  sucralfate (CARAFATE) 1 g tablet Take 1 g by mouth 4 (four) times daily -  with meals and at bedtime.   Yes [provider]  torsemide (DEMADEX) 100 MG tablet Take 100 mg by mouth 2 (two) times daily.   Yes [provider]  traMADol (ULTRAM) 50 MG tablet Take 50 mg by mouth every 6 (six) hours as needed for moderate pain (pain score 4-6) or severe pain (pain score 7-10). 04/10/17  Yes [provider]  traZODone (DESYREL) 50 MG tablet Take 50 mg by mouth at bedtime. 04/23/22  Yes [provider]  levocetirizine (XYZAL) 5 MG tablet Take 5 mg by mouth at bedtime. Patient not taking: Reported on 04/04/2023 09/05/21   [provider]    Current Medications Scheduled Meds:  apixaban  5 mg Oral BID   atorvastatin   40 mg Oral Daily   carvedilol  3.125 mg Oral BID WC   Chlorhexidine Gluconate Cloth  6 each Topical Q0600   ferrous sulfate  325 mg Oral Q1200   FLUoxetine  40 mg Oral Daily   gabapentin  600 mg Oral QHS   hydrALAZINE  25 mg Oral Q8H   insulin aspart  0-9 Units Subcutaneous TID WC   insulin glargine-yfgn  4 Units Subcutaneous Daily   isosorbide mononitrate  15 mg Oral Daily   levothyroxine  75 mcg Oral Daily   linezolid  600 mg Oral Q12H   mometasone-formoterol  2 puff Inhalation BID   multivitamin with minerals  1 tablet Oral Daily   mupirocin ointment  1 Application Nasal BID   pantoprazole  40 mg Oral Daily   polyethylene glycol  17 g Oral BID   senna  1 tablet Oral Daily   sodium chloride flush  3 mL Intravenous Q12H   sucralfate  1 g Oral TID WC & HS   traZODone  25 mg Oral QHS   umeclidinium bromide  1 puff Inhalation Daily   Continuous Infusions:  ceFEPime (MAXIPIME) IV 2 g (04/18/23 1356)   PRN Meds:.acetaminophen **OR** acetaminophen, Gerhardt's butt cream, ipratropium-albuterol, sodium chloride flush  CBC Recent Labs  Lab 04/16/23 0650 04/17/23 0306 04/17/23 1220 04/18/23 0238  WBC 9.7 10.2  --  11.4*  HGB 9.7* 9.5* 10.2*  10.9* 9.8*  HCT 31.3* 30.8* 30.0*  32.0* 31.5*  MCV 93.7 92.8  --  95.2  PLT 259 269  --  272   Basic Metabolic Panel Recent Labs  Lab 04/13/23 0238 04/13/23 2006 04/14/23 0507 04/15/23 0655 04/16/23 0650 04/17/23 0306 04/17/23 1220 04/18/23 0238  NA 141 136 137 138 139 137 139  137 140  K 3.8 3.6 3.9 3.5 3.9 4.1 3.1*  3.3* 4.9  CL 93* 87* 89* 87* 91* 93*  --  95*  CO2 37* 35* 39* 36* 33* 32  --  30  GLUCOSE 188* 211* 184* 205* 193* 197*  --  172*  BUN 30* 32* 30* 34* 41* 46*  --  54*  CREATININE 2.15* 1.82* 2.01* 2.17* 2.26* 2.39*  --  2.52*  CALCIUM 9.2 8.8* 8.7* 8.7* 8.9 8.8*  --  8.8*    Physical Exam  Blood pressure (!) 113/52, pulse 78, temperature (!) 97.5 F (36.4 C), temperature source Oral, resp. rate 20,  height 5' (1.524 m), weight 96.7 kg, SpO2 93%. GEN: Chronically ill-appearing elderly female sitting in the chair, NAD ENT: NCAT EYES: EOMI CV: Regular, normal S1 and S2, no rub PULM: Diminished throughout, especially in the bases ABD: Soft, nontender SKIN: Deep erythema consistent with chronic lower extremity edema and venous stasis of the bilateral lower legs EXT: Edema largely resolved until you get towards the sock line where is 1+.  Assessment 85F presenting with acute HFrEF exacerbation progressive CKD.    CKD4 versus some acute on chronic component.  Likely driven by worsening cardiac status and new acute HFrEF.  Some increase in creatinine even off diuretics.  FENa consistent with true or pseudo intravascular volume depletion == ineffective arterial blood volume Acute HFrEF presenting with hypervolemia, improving.  Cardiology following.  Potentially needing ischemia workup in the future. Anemia: Hemoglobin is fairly stable over the past several days. History of bioprosthetic aortic valve replacement History of PPM for CHB Current LV thrombus on Longstanding DM2 and hypertension  Plan Would not resume diuretics at this time, continue to support and likely GFR will settle out Maintenance dose diuretics will be necessary prior to discharge, if kidney function is improving tomorrow can consider starting tomorrow Dialysis is not indicated and likely would be very challenging for her  if chosen Once GFR stabilizes likely can resume Jardiance Daily weights, Daily Renal Panel, Strict I/Os, Avoid nephrotoxins (NSAIDs, judicious IV Contrast)  CK[facility] will cont to follow.    Arita Miss, MD  04/18/2023, 2:03 PM

## 2023-04-18 NOTE — Progress Notes (Signed)
Daily Progress Note Intern Pager: [phone]  Patient name: Tonya Glass Medical record number: [id number] Date of birth: March 16, 1946 Age: 77 y.o. Gender: female  Primary Care Provider: No primary care provider on file. Consultants: Cardiology, Nephrology Code Status: DNR/DNI  Pt Overview and Major Events to Date:  1/27 - Admitted 12/1 - Hemoglobin <8, transfused 1 unit PRBC, Cr trending up 12/2 - Lasix gtt stopped, no further diuresis 12/4 - RHC euvolemic   Assessment and Plan: Blayze Kober is a 77 y.o. female with a pertinent PMH of HFpEF, HLD, aortic stenosis s/p valve replacement, sick sinus syndrome, hypothyroidism, HTN, CKD stage IV, T2DM, and COPD on home oxygen 4L with exertion who presented with dyspnea and was admitted for CHF exacerbation, currently complicated by creatinine elevation.  Creatinine now trending up further to 2.52, and with FeNa of 0.3 today, prerenal etiology suspected, most likely in the setting of overdiuresis.  Renal ultrasound was unrevealing yesterday, no evidence of retention on daily bladder scans.  Will consult nephrology and follow-up recommendations, consider volume repletion as needed.  Otherwise, awaiting SNF placement per TOC. Assessment & Plan Acute exacerbation of CHF (congestive heart failure) (HCC) RHC showing normal volume status yesterday, no further diuresis at this time.  Still net negative 8.5 L since admission. -Daily weights, strict I&Os -PT/OT evaluate and treat -Out of bed order with meals -Consult RD given poor p.o. intake and nutrition, follow up recs -Cardiology following, recs appreciated   -Hold Lasix drip to allow for renovascular equilibration -GDMT: Hydralazine 25 mg TID, Imdur 15 mg daily, carvedilol 3.125 mg BID -Follow up recs after RHC Acute kidney injury 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) Cr up to 2.52 and gradually trending up, former baseline ~1.5-1.7.  New baseline may be higher given  possible progression of CKD.  Cardiology suspects overdiuresis, furthermore FeNa 0.3 today and favoring prerenal etiology. -Hold Jardiance and irbesartan for now -Nephrology consulted, may need some gentle volume replation -Daily bladder scan to ensure patient is not retaining urine -Daily BMP Acute on chronic hypoxic respiratory failure (HCC) Saturations now remaining >88% on room air, does desaturate when when she drifts off to sleep, suspect patient require oxygen at night for home.  Really requires BiPAP when sleeping. -Finished 5 days of linezolid 600 mg today -DuoNeb Q6h PRN Anemia Hgb stable at 9.8.  S/p 1 unit PRBC on 12/1.  Recommend outpatient colonoscopy.  Low concern for GI bleed, will descalate pantoprazole to once daily.  Is already on Carafate TID. -Continue home iron 325 mg daily -Transfuse <8.0 Hgb given she has has had significant UOP with diuresis -Pantoprazole 40 mg to once daily from BID -Trend daily hemoglobin on CBC LV thrombus Seen on echo 11/28. -Monitor fever curve -Continue heparin drip -Cardiology planning for L/R heart cath to evaluate for coronary artery embolism when clinically stable COPD (chronic obstructive pulmonary disease) (HCC) Low concern for acute exacerbation.  Patient has now been adherent with BiPAP overnight, did receive DuoNeb once yesterday for wheezing. -Wean O2 as able with goal O2 88-92%, home baseline 4 L with exertion -DuoNebs every 4 hours PRN -Continue Dulera 2 puffs BID (on Symbicort formulary equivalent) -Continue guaifenesin 600 mg BID -Nightly BiPAP DM2 (diabetes mellitus, type 2) (HCC) A1c 6.0.  CBG in last 24 hours have remained appropriate. -Continue sensitive SSI -Start 4 units Semglee starting tomorrow -Hold home Jardiance 10 mg in setting of AKI -Hold home glipizide 10 mg BID Paroxysmal atrial flutter (HCC) LV apical  thrombus noted on TTE 11/28. -Hold home Eliquis 5mg  BID -Currently on heparin drip for LV apical  thrombus -AM BMP, mag; goal K>4, mag>2 Constipation Last BM 12/4. -Continue bowel regimen with MiraLAX and senna  Chronic and Stable Problems: Hypothyroidism: Continue levothyroxine 75 mcg daily Depression: Continue fluoxetine 40 mg daily, decreased trazodone to 25 mg nightly given somnolence Peripheral neuropathy: Continue gabapentin 600 mg nightly HLD: Continue home atorvastatin 40 mg daily Wounds to right leg: Wound care following continue compression wraps bilateral legs GERD: Continue home carafate 1 g TID, descalate to pantoprazole 40 mg ONCE daily  FEN/GI: Heart healthy diet PPx: Heparin TID Dispo: SNF pending clinical improvement . Barriers include creatinine trend.  Subjective:  This morning, patient denies shortness of breath, chest pain.  States she feels well.  Continues to have poor p.o. intake, states she is not hungry and does not like the hospital food.  Objective: Temp:  [97.5 F (36.4 C)-98.4 F (36.9 C)] 97.5 F (36.4 C) (12/05 0802) Pulse Rate:  [74-88] 81 (12/05 0802) Resp:  [10-23] 20 (12/05 0802) BP: (121-169)/(33-62) 134/33 (12/05 0802) SpO2:  [91 %-99 %] 96 % (12/05 0802) FiO2 (%):  [30 %] 30 % (12/04 2158) Weight:  [96.7 kg-97 kg] 96.7 kg (12/05 0357)  Physical Exam: General: Age-appropriate, resting comfortably in bed, NAD, alert and at baseline. Cardiovascular: No JVD.  Regular rate and rhythm. Normal S1/S2. No murmurs, rubs, or gallops appreciated. 2+ radial pulses. Pulmonary: Clear bilaterally to ascultation. No increased WOB, no accessory muscle usage on room air. No wheezes, rales, or crackles. Abdominal: Normoactive bowel sounds, nondistended. No tenderness to deep or light palpation. No rebound or guarding. No HSM. Skin: Warm and dry. Extremities: No peripheral edema bilaterally.  Laboratory: Most recent CBC Lab Results  Component Value Date   WBC 11.4 (H) 04/18/2023   HGB 9.8 (L) 04/18/2023   HCT 31.5 (L) 04/18/2023   MCV 95.2  04/18/2023   PLT 272 04/18/2023   Most recent BMP    Latest Ref Rng & Units 04/18/2023    2:38 AM  BMP  Glucose 70 - 99 mg/dL 401   BUN 8 - 23 mg/dL 54   Creatinine 0.27 - 1.00 mg/dL 2.53   Sodium 664 - 403 mmol/L 140   Potassium 3.5 - 5.1 mmol/L 4.9   Chloride 98 - 111 mmol/L 95   CO2 22 - 32 mmol/L 30   Calcium 8.9 - 10.3 mg/dL 8.8     Other pertinent labs: -None  New Imaging/Diagnostic Tests: -None  Prabhleen Montemayor, MD 04/18/2023, 8:36 AM  PGY-1, [facility] FPTS Intern pager: [phone], text pages welcome Secure chat group [facility] Teaching Service

## 2023-04-18 NOTE — Progress Notes (Signed)
Mobility Specialist Progress Note:    04/18/23 1147  Mobility  Activity Transferred to/from [facility]  Level of Assistance Moderate assist, patient does 50-74%  Assistive Device Stedy  Activity Response Tolerated well  Mobility Referral Yes  Mobility visit 1 Mobility  Mobility Specialist Start Time (ACUTE ONLY) 1040  Mobility Specialist Stop Time (ACUTE ONLY) 1051  Mobility Specialist Time Calculation (min) (ACUTE ONLY) 11 min   Pt received on BSC agreeable to mobility. Pt needed ModA to rise from the [facility] and contact guard to rise from stedy. RN assisted w/ pericare, BM complete. Returned to recliner w/o fault. Call bell and personal belongings in reach. RN in room.   Thompson Grayer Mobility Specialist  Please contact vis Secure Chat or  Rehab Office [phone]

## 2023-04-18 NOTE — Assessment & Plan Note (Signed)
Low concern for acute exacerbation.  Patient has now been adherent with BiPAP overnight, did receive DuoNeb once yesterday for wheezing. -Wean O2 as able with goal O2 88-92%, home baseline 4 L with exertion -DuoNebs every 4 hours PRN -Continue Dulera 2 puffs BID (on Symbicort formulary equivalent) -Continue guaifenesin 600 mg BID -Nightly BiPAP

## 2023-04-18 NOTE — Progress Notes (Signed)
Physical Therapy Treatment Patient Details Name: Tonya Glass MRN: [id number] DOB: June 01, 1945 Today's Date: 04/18/2023   History of Present Illness Tonya Glass is 77 y.o. admitted for acute on chronic heart failure exacerbation. Small LV thrombus noted with AKI and anemia. Pt requiring intermittent bipap. S/p R heart cath 12/4. Pertinent PMH/PSH includes HFpEF, HLD, Aortic stenosis s/p valve replacement, Sick sinus syndrome, Hypothyroidism, HTN, CKD, COPD, T2DM.    PT Comments  Pt's BP appeared to remain stable even though she reported feeling a little lightheaded with mobility today. She continues to demonstrate deficits in lower extremity muscular strength and power, impacting her ease with transfers. She required heavy modA to stand from lower surfaces like a recliner or commode. However, she was able to stand from higher surfaces, like the stedy flaps, with CGA-minA today. She has difficulty fully extending her hips. Guided pt through serial sit <> stand reps from the stedy flaps to try to improve her strength. Will continue to follow acutely.     If plan is discharge home, recommend the following: Two people to help with walking and/or transfers;Two people to help with bathing/dressing/bathroom;Assistance with cooking/housework;Assist for transportation   Can travel by private vehicle     No  Equipment Recommendations  None recommended by PT    Recommendations for Other Services       Precautions / Restrictions Precautions Precautions: Fall Precaution Comments: R radial access heart cath 12/4 Restrictions Weight Bearing Restrictions: No     Mobility  Bed Mobility Overal bed mobility: Needs Assistance Bed Mobility: Sit to Supine       Sit to supine: Mod assist   General bed mobility comments: Cues provided for pt to lean to R elbow to descend trunk, modA needed to lift bil legs to bed surface    Transfers Overall transfer level: Needs assistance Equipment used:  Ambulation equipment used Transfers: Bed to chair/wheelchair/BSC, Sit to/from Stand Sit to Stand: Mod assist, Min assist, Contact guard assist           General transfer comment: Cued pt with all mobility not to use or just use her her R UE minimally s/p cath access yesterday. Pt able to pull up on stedy with L UE and assistance at hips to extend and power up to stand. Pt required heavy modA to stand from recliner 1x (x2 attempts before successful) and from commode 1x. CGA-minA needed to stand from stedy flaps 6x. Transfer via Lift Equipment: Stedy  Ambulation/Gait               General Gait Details: unable   Optometrist     Tilt Bed    Modified Rankin (Stroke Patients Only)       Balance Overall balance assessment: Needs assistance Sitting-balance support: Bilateral upper extremity supported, Feet supported, Single extremity supported Sitting balance-Leahy Scale: Poor Sitting balance - Comments: UE support.   Standing balance support: Single extremity supported, During functional activity Standing balance-Leahy Scale: Poor Standing balance comment: L UE support and external physical assistance to stand with stedy                            Cognition Arousal: Alert Behavior During Therapy: WFL for tasks assessed/performed Overall Cognitive Status: Impaired/Different from baseline Area of Impairment: Memory, Problem solving  Memory: Decreased short-term memory       Problem Solving: Slow processing, Difficulty sequencing, Requires verbal cues General Comments: intermittent slow processing and often states "Now what are we doing?"        Exercises Other Exercises Other Exercises: x5 serial sit <> stand from stedy flaps, minA-CGA    General Comments General comments (skin integrity, edema, etc.): BP stable with positional changes even though pt reported feeling lightheaded       Pertinent Vitals/Pain Pain Assessment Pain Assessment: Faces Faces Pain Scale: Hurts little more Pain Location: L hip, knees Pain Descriptors / Indicators: Grimacing, Guarding, Sore, Moaning, Discomfort Pain Intervention(s): Limited activity within patient's tolerance, Monitored during session, Repositioned    Home Living                          Prior Function            PT Goals (current goals can now be found in the care plan section) Acute Rehab PT Goals Patient Stated Goal: get better PT Goal Formulation: With patient Time For Goal Achievement: 04/28/23 Potential to Achieve Goals: Fair Progress towards PT goals: Progressing toward goals    Frequency    Min 1X/week      PT Plan      Co-evaluation              AM-PAC PT "6 Clicks" Mobility   Outcome Measure  Help needed turning from your back to your side while in a flat bed without using bedrails?: A Lot Help needed moving from lying on your back to sitting on the side of a flat bed without using bedrails?: A Lot Help needed moving to and from a bed to a chair (including a wheelchair)?: Total Help needed standing up from a chair using your arms (e.g., wheelchair or bedside chair)?: A Lot Help needed to walk in hospital room?: Total Help needed climbing 3-5 steps with a railing? : Total 6 Click Score: 9    End of Session Equipment Utilized During Treatment: Oxygen;Gait belt Activity Tolerance: Patient tolerated treatment well Patient left: in bed;with call bell/phone within reach;with bed alarm set   PT Visit Diagnosis: Other abnormalities of gait and mobility (R26.89);Muscle weakness (generalized) (M62.81);Pain;Unsteadiness on feet (R26.81);Difficulty in walking, not elsewhere classified (R26.2) Pain - Right/Left:  (bilateral) Pain - part of body: Ankle and joints of foot;Leg;Knee;Hip     Time: 1610-9604 PT Time Calculation (min) (ACUTE ONLY): 29 min  Charges:    $Therapeutic  Exercise: 8-22 mins $Therapeutic Activity: 8-22 mins PT General Charges $$ ACUTE PT VISIT: 1 Visit                     Virgil Benedict, PT, DPT Acute Rehabilitation Services  Office: [phone]    Bettina Gavia 04/18/2023, 5:35 PM

## 2023-04-18 NOTE — TOC Progression Note (Signed)
Transition of Care [facility]) - Progression Note    Patient Details  Name: Tonya Glass MRN: [id number] Date of Birth: 05-May-1946  Transition of Care [facility]) CM/SW Contact  Michaela Corner, [place] Phone Number: 04/18/2023, 11:44 AM  Clinical Narrative:   [facility] received - Auth id: [id number] , auth expires:04/17/2023 - 04/19/2023.       Barriers to Discharge: Continued Medical Work up  Expected Discharge Plan and Services       Living arrangements for the past 2 months: Assisted Living Facility                                       Social Determinants of Health (SDOH) Interventions SDOH Screenings   Food Insecurity: No Food Insecurity (04/11/2023)  Housing: Low Risk  (04/11/2023)  Transportation Needs: No Transportation Needs (04/11/2023)  Utilities: Not At Risk (04/11/2023)  Social Connections: Unknown (09/25/2021)   Received from [facility]  Tobacco Use: Medium Risk (04/10/2023)    Readmission Risk Interventions     No data to display

## 2023-04-18 NOTE — Plan of Care (Signed)
  Problem: Education: Goal: Knowledge of General Education information will improve Description Including pain rating scale, medication(s)/side effects and non-pharmacologic comfort measures Outcome: Progressing   Problem: Health Behavior/Discharge Planning: Goal: Ability to manage health-related needs will improve Outcome: Progressing   

## 2023-04-18 NOTE — Assessment & Plan Note (Addendum)
Last BM 12/4. -Continue bowel regimen with MiraLAX and senna

## 2023-04-19 DIAGNOSIS — I5043 Acute on chronic combined systolic (congestive) and diastolic (congestive) heart failure: Secondary | ICD-10-CM | POA: Diagnosis not present

## 2023-04-19 LAB — BASIC METABOLIC PANEL WITH GFR
Anion gap: 13 (ref 5–15)
BUN: 53 mg/dL — ABNORMAL HIGH (ref 8–23)
CO2: 31 mmol/L (ref 22–32)
Calcium: 8.9 mg/dL (ref 8.9–10.3)
Chloride: 92 mmol/L — ABNORMAL LOW (ref 98–111)
Creatinine, Ser: 2.66 mg/dL — ABNORMAL HIGH (ref 0.44–1.00)
GFR, Estimated: 18 mL/min — ABNORMAL LOW
Glucose, Bld: 156 mg/dL — ABNORMAL HIGH (ref 70–99)
Potassium: 2.9 mmol/L — ABNORMAL LOW (ref 3.5–5.1)
Sodium: 136 mmol/L (ref 135–145)

## 2023-04-19 LAB — CBC
HCT: 30.3 % — ABNORMAL LOW (ref 36.0–46.0)
Hemoglobin: 9.4 g/dL — ABNORMAL LOW (ref 12.0–15.0)
MCH: 28.8 pg (ref 26.0–34.0)
MCHC: 31 g/dL (ref 30.0–36.0)
MCV: 92.9 fL (ref 80.0–100.0)
Platelets: 323 10*3/uL (ref 150–400)
RBC: 3.26 MIL/uL — ABNORMAL LOW (ref 3.87–5.11)
RDW: 15.1 % (ref 11.5–15.5)
WBC: 10.5 10*3/uL (ref 4.0–10.5)
nRBC: 0 % (ref 0.0–0.2)

## 2023-04-19 LAB — GLUCOSE, CAPILLARY
Glucose-Capillary: 146 mg/dL — ABNORMAL HIGH (ref 70–99)
Glucose-Capillary: 158 mg/dL — ABNORMAL HIGH (ref 70–99)
Glucose-Capillary: 176 mg/dL — ABNORMAL HIGH (ref 70–99)
Glucose-Capillary: 195 mg/dL — ABNORMAL HIGH (ref 70–99)
Glucose-Capillary: 232 mg/dL — ABNORMAL HIGH (ref 70–99)

## 2023-04-19 LAB — RENAL FUNCTION PANEL
Albumin: 2.9 g/dL — ABNORMAL LOW (ref 3.5–5.0)
Anion gap: 14 (ref 5–15)
BUN: 55 mg/dL — ABNORMAL HIGH (ref 8–23)
CO2: 30 mmol/L (ref 22–32)
Calcium: 9.1 mg/dL (ref 8.9–10.3)
Chloride: 95 mmol/L — ABNORMAL LOW (ref 98–111)
Creatinine, Ser: 2.61 mg/dL — ABNORMAL HIGH (ref 0.44–1.00)
GFR, Estimated: 18 mL/min — ABNORMAL LOW (ref >60)
Glucose, Bld: 193 mg/dL — ABNORMAL HIGH (ref 70–99)
Phosphorus: 2.6 mg/dL (ref 2.5–4.6)
Potassium: 3.6 mmol/L (ref 3.5–5.1)
Sodium: 139 mmol/L (ref 135–145)

## 2023-04-19 LAB — MAGNESIUM: Magnesium: 2.9 mg/dL — ABNORMAL HIGH (ref 1.7–2.4)

## 2023-04-19 MED ORDER — ENSURE ENLIVE PO LIQD
237.0000 mL | ORAL | Status: DC
  Administered 2023-04-19 – 2023-04-22 (×2): 237 mL via ORAL

## 2023-04-19 MED ORDER — POTASSIUM CHLORIDE CRYS ER 20 MEQ PO TBCR: 50.0000 meq | EXTENDED_RELEASE_TABLET | Freq: Once | ORAL | Status: DC

## 2023-04-19 MED ORDER — NA FERRIC GLUC CPLX IN SUCROSE 12.5 MG/ML IV SOLN
250.0000 mg | Freq: Every day | INTRAVENOUS | Status: AC
  Administered 2023-04-19 – 2023-04-22 (×4): 250 mg via INTRAVENOUS
  Filled 2023-04-19 (×4): qty 20

## 2023-04-19 MED ORDER — POTASSIUM CHLORIDE 20 MEQ PO PACK
60.0000 meq | PACK | Freq: Once | ORAL | Status: AC
  Administered 2023-04-19: 60 meq via ORAL
  Filled 2023-04-19: qty 3

## 2023-04-19 MED ORDER — POTASSIUM CHLORIDE CRYS ER 20 MEQ PO TBCR: 60.0000 meq | EXTENDED_RELEASE_TABLET | Freq: Once | ORAL | Status: DC

## 2023-04-19 NOTE — Plan of Care (Signed)
  Problem: Education: Goal: Knowledge of General Education information will improve Description Including pain rating scale, medication(s)/side effects and non-pharmacologic comfort measures Outcome: Progressing   Problem: Health Behavior/Discharge Planning: Goal: Ability to manage health-related needs will improve Outcome: Progressing   

## 2023-04-19 NOTE — Progress Notes (Signed)
Daily Progress Note Intern Pager: [phone]  Patient name: Tonya Glass Medical record number: [id number] Date of birth: November 25, 1945 Age: 77 y.o. Gender: female  Primary Care Provider: No primary care provider on file. Consultants: Cardiology, Nephrology  Code Status: DNR/DNI  Pt Overview and Major Events to Date:  11/27 - Admitted 12/1 - Hemoglobin <8, transfused 1 unit PRBC, Cr trending up 12/2 - Lasix gtt stopped, no further diuresis 12/4 - RHC euvolemic 12/5 - Nephrology consult   Assessment and Plan: Tonya Glass is a 77 y.o. female with a pertinent PMH of HFpEF, HLD, aortic stenosis s/p valve replacement, sick sinus syndrome, hypothyroidism, HTN, CKD stage IV, T2DM, and COPD on home oxygen 4L with exertion who presented with dyspnea and was admitted for CHF exacerbation, currently complicated by creatinine elevation.  Now awaiting SNF placement, but with a gradual and progressive uptrend in creatinine.  Nephrology was consulted yesterday and favor intravascular depletion leading to an effective arterial blood volume.  Recommended supportive care, no indication for dialysis at this time.  Will need to see improvement in creatinine prior to discharge in order to restart the necessary maintenance dose diuretics.  Patient has had poor p.o. intake, suspect this is contributing to her persistent volume depletion following diuresis.  Placed nursing care order for nurse to encourage 150 mL intake every 4 hours.  After conversation with daughter, suspect patient is becoming gradually becoming more delirious but did not have any signs supporting acute mental status change or concern for recent stroke.  More just gradually becoming more forgetful and slightly more confused than patient.  Will start delirium precautions and follow closely. Assessment & Plan Acute exacerbation of CHF (congestive heart failure) (HCC) RHC showing normal volume status yesterday, no further diuresis at this  time.  Still net negative 8.5 L since admission. -Daily weights, strict I&Os -PT/OT evaluate and treat -Out of bed order with meals -Cardiology following, recs appreciated   -Hold Lasix drip to allow for renovascular equilibration -GDMT: Hydralazine 25 mg TID, Imdur 15 mg daily, carvedilol 3.125 mg BID -Follow up recs after RHC Acute kidney injury 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) Cr up to 2.66 and continually trending up but GFR stable, former baseline ~1.5-1.7.  Per nephrology, FENa indicates ineffective arterial blood volume, recommend supportive care and restarting maintenance dose diuretics when kidney function improves.  Do not recommend dialysis.  Poor capillary refill on exam. -Hold Jardiance and irbesartan for now -Avoid nephrotoxins -Encourage PO intake, nursing care order for 150 mL to be offered every 4 hours -Seen by RD for poor PO, following up about Ensure -Appreciate Nephrology recs, who will continue to follow -PM RFP to check electrolytes and phosphorus -Daily RFP per Nephrology Acute on chronic hypoxic respiratory failure (HCC) Saturations now remaining >88% on room air, does desaturate when when she drifts off to sleep, suspect patient require oxygen at night for home.  Really requires BiPAP when sleeping. -Finished 5 days of linezolid 600 mg -DuoNeb Q6h PRN Anemia Hgb stable at 9.8.  S/p 1 unit PRBC on 12/1.  Recommend considering outpatient colonoscopy.  Low concern for GI bleed.  Is already on Carafate TID. -Continue home iron 325 mg daily -Transfuse <8.0 Hgb given she has has had significant UOP with diuresis -Pantoprazole 40 mg once daily -Trend daily hemoglobin on CBC LV thrombus Seen on echo 11/28. -Monitor fever curve -Continue heparin drip -Cardiology planning for L/R heart cath to evaluate for coronary artery embolism when clinically  stable COPD (chronic obstructive pulmonary disease) (HCC) Low concern for acute exacerbation.  Patient  has now been adherent with BiPAP overnight. -Wean O2 as able with goal O2 88-92%, home baseline 4 L with exertion -DuoNebs every 4 hours PRN -Continue Dulera 2 puffs BID (on Symbicort formulary equivalent) -Continue guaifenesin 600 mg BID -Nightly BiPAP DM2 (diabetes mellitus, type 2) (HCC) A1c 6.0.  CBG in last 24 hours have remained appropriate. -Continue sensitive SSI -Start 4 units Semglee starting tomorrow -Hold home Jardiance 10 mg in setting of AKI -Hold home glipizide 10 mg BID Paroxysmal atrial flutter (HCC) LV apical thrombus noted on TTE 11/28. -Hold home Eliquis 5mg  BID -Currently on heparin drip for LV apical thrombus -AM BMP, mag; goal K>4, mag>2 Constipation Last BM 12/5. -Continue bowel regimen with MiraLAX and senna  Chronic and Stable Problems: Hypothyroidism: Continue levothyroxine 75 mcg daily Depression: Continue fluoxetine 40 mg daily, decreased trazodone to 25 mg nightly given somnolence Peripheral neuropathy: Continue gabapentin 600 mg nightly HLD: Continue home atorvastatin 40 mg daily Wounds to right leg: Wound care following continue compression wraps bilateral legs GERD: Continue home carafate 1 g TID, descalate to pantoprazole 40 mg ONCE daily  FEN/GI: Renal diet PPx: Heparin TID Dispo: SNF pending clinical improvement . Barriers include creatinine trending.  Subjective:  This morning, patient was comfortable in her room.  Alert to self, place, situation, but not time.  Denies dyspnea, chest pain.  Called patient's daughter, Debara Pickett, to update on patient's progress.  Discussed creatinine increase and poor PO intake at length.  Daughter expresses significant concern that patient is more forgetful on her daily calls with daughter.  She is forgetting details of conversations and things said just minutes ago.  Shared suspicion that patient may be becoming more delirious in the hospital, encouraged daughter to reorient patient as  able.  Objective: Temp:  [97.5 F (36.4 C)-98 F (36.7 C)] 97.9 F (36.6 C) (12/06 0400) Pulse Rate:  [78-90] 80 (12/06 0400) Resp:  [16-20] 19 (12/06 0400) BP: (106-139)/(33-64) 122/40 (12/06 0412) SpO2:  [90 %-98 %] 98 % (12/06 0400) FiO2 (%):  [21 %] 21 % (12/05 0842) Weight:  [95.2 kg] 95.2 kg (12/06 0241)  Physical Exam: General: Age-appropriate, resting comfortably in chair, NAD, alert when awoken. Cardiovascular: No JVD.  Regular rate and rhythm. Normal S1/S2. No murmurs, rubs, or gallops appreciated. 2+ radial pulses. Pulmonary: Clear bilaterally to ascultation. No increased WOB, no accessory muscle usage on room air, does desat to 86% when snoozing. No wheezes, rales, or crackles. Extremities: No peripheral edema bilaterally.  Laboratory: Most recent CBC Lab Results  Component Value Date   WBC 10.5 04/19/2023   HGB 9.4 (L) 04/19/2023   HCT 30.3 (L) 04/19/2023   MCV 92.9 04/19/2023   PLT 323 04/19/2023   Most recent BMP    Latest Ref Rng & Units 04/19/2023    2:54 AM  BMP  Glucose 70 - 99 mg/dL 161   BUN 8 - 23 mg/dL 53   Creatinine 0.96 - 1.00 mg/dL 0.45   Sodium 409 - 811 mmol/L 136   Potassium 3.5 - 5.1 mmol/L 2.9   Chloride 98 - 111 mmol/L 92   CO2 22 - 32 mmol/L 31   Calcium 8.9 - 10.3 mg/dL 8.9     Other pertinent labs: -None  New Imaging/Diagnostic Tests: -None  Donnielle Addison, MD 04/19/2023, 7:15 AM  PGY-1, [facility] FPTS Intern pager: [phone], text pages welcome Secure chat group [facility]  [facility] Teaching Service

## 2023-04-19 NOTE — Assessment & Plan Note (Addendum)
Cr up to 2.66 and continually trending up but GFR stable, former baseline ~1.5-1.7.  Per nephrology, FENa indicates ineffective arterial blood volume, recommend supportive care and restarting maintenance dose diuretics when kidney function improves.  Do not recommend dialysis.  Poor capillary refill on exam. -Hold Jardiance and irbesartan for now -Avoid nephrotoxins -Encourage PO intake, nursing care order for 150 mL to be offered every 4 hours -Seen by RD for poor PO, following up about Ensure -Appreciate Nephrology recs, who will continue to follow -PM RFP to check electrolytes and phosphorus -Daily RFP per Nephrology

## 2023-04-19 NOTE — TOC Progression Note (Signed)
Transition of Care [facility]) - Progression Note    Patient Details  Name: Camila Boscia MRN: [id number] Date of Birth: [birth year]  Transition of Care [facility]) CM/SW Contact  Michaela Corner, [place] Phone Number: 04/19/2023, 11:24 AM  Clinical Narrative:   CSW called [employer] to void ins auth for SNF as pt is not medically stable to DC and insurance auth expires 04/19/2023.   Will need to resubmit auth when pt is medically stable.       Barriers to Discharge: Continued Medical Work up  Expected Discharge Plan and Services       Living arrangements for the past 2 months: Assisted Living Facility                                       Social Determinants of Health (SDOH) Interventions SDOH Screenings   Food Insecurity: No Food Insecurity (04/11/2023)  Housing: Low Risk  (04/11/2023)  Transportation Needs: No Transportation Needs (04/11/2023)  Utilities: Not At Risk (04/11/2023)  Social Connections: Unknown (09/25/2021)   Received from [facility]  Tobacco Use: Medium Risk (04/10/2023)    Readmission Risk Interventions     No data to display

## 2023-04-19 NOTE — Assessment & Plan Note (Addendum)
Low concern for acute exacerbation.  Patient has now been adherent with BiPAP overnight. -Wean O2 as able with goal O2 88-92%, home baseline 4 L with exertion -DuoNebs every 4 hours PRN -Continue Dulera 2 puffs BID (on Symbicort formulary equivalent) -Continue guaifenesin 600 mg BID -Nightly BiPAP

## 2023-04-19 NOTE — Progress Notes (Signed)
[facility] Progress Note   Assessment/ Plan:   1. Acute kidney Injury on chronic kidney disease stage IIIB: Suspected to be hemodynamically mediated/cardiorenal in the setting of new acute HFrEF.  Agree with holding ARB/SGLT2 inhibitor and diuretics at this time as we await for stabilization and potential improvement of renal function.  Appears anuric based on charted urine output however, 2.  Hypokalemia: Possibly secondary to diuretic induced losses, magnesium level elevated/adequate. 3.  Anemia: Likely secondary to chronic illness, without overt loss and with low iron levels when checked on 11/28.  Will order for intravenous iron. 4.  History of bioprosthetic aortic valve replacement valve: With recently recognized LV thrombus and currently on apixaban.  Subjective:   Reports to be feeling fatigued this morning, suspect she slept poorly at night   Objective:   BP (!) 122/40   Pulse 74   Temp 97.9 F (36.6 C) (Oral)   Resp 18   Ht 5' (1.524 m)   Wt 95.2 kg   SpO2 98%   BMI 40.99 kg/m  No intake or output data in the 24 hours ending 04/19/23 0956 Weight change: -1.8 kg  Physical Exam: Gen: Appears fatigued resting in bed, awakens to conversation with appropriate responses CVS: Pulse regular, normal rate, S1 and S2 normal Resp: Decreased breath sounds over bases, no distinct rales/rhonchi Abd: Soft, flat, nontender, bowel sounds normal Ext: 1+ bilateral lower extremity edema with venous stasis changes  Imaging: CARDIAC CATHETERIZATION  Result Date: 04/17/2023 Overall normal right heart pressures. Mild elevation of PA pressure (mean 27 mmHg) PCWP 12 mmHg CO 5.6 L/min CI 2.94.    Labs: BMET Recent Labs  Lab 04/13/23 2006 04/14/23 0507 04/15/23 0655 04/16/23 0650 04/17/23 0306 04/17/23 1220 04/18/23 0238 04/19/23 0254  NA 136 137 138 139 137 139  137 140 136  K 3.6 3.9 3.5 3.9 4.1 3.1*  3.3* 4.9 2.9*  CL 87* 89* 87* 91* 93*  --  95* 92*  CO2 35* 39*  36* 33* 32  --  30 31  GLUCOSE 211* 184* 205* 193* 197*  --  172* 156*  BUN 32* 30* 34* 41* 46*  --  54* 53*  CREATININE 1.82* 2.01* 2.17* 2.26* 2.39*  --  2.52* 2.66*  CALCIUM 8.8* 8.7* 8.7* 8.9 8.8*  --  8.8* 8.9   CBC Recent Labs  Lab 04/16/23 0650 04/17/23 0306 04/17/23 1220 04/18/23 0238 04/19/23 0254  WBC 9.7 10.2  --  11.4* 10.5  HGB 9.7* 9.5* 10.2*  10.9* 9.8* 9.4*  HCT 31.3* 30.8* 30.0*  32.0* 31.5* 30.3*  MCV 93.7 92.8  --  95.2 92.9  PLT 259 269  --  272 323    Medications:     apixaban  5 mg Oral BID   atorvastatin  40 mg Oral Daily   carvedilol  3.125 mg Oral BID WC   ferrous sulfate  325 mg Oral Q1200   FLUoxetine  40 mg Oral Daily   gabapentin  600 mg Oral QHS   hydrALAZINE  25 mg Oral Q8H   insulin aspart  0-9 Units Subcutaneous TID WC   insulin glargine-yfgn  4 Units Subcutaneous Daily   isosorbide mononitrate  15 mg Oral Daily   levothyroxine  75 mcg Oral Daily   mometasone-formoterol  2 puff Inhalation BID   multivitamin with minerals  1 tablet Oral Daily   mupirocin ointment  1 Application Nasal BID   pantoprazole  40 mg Oral Daily   polyethylene glycol  17 g Oral BID   senna  1 tablet Oral Daily   sodium chloride flush  3 mL Intravenous Q12H   sucralfate  1 g Oral TID WC & HS   traZODone  25 mg Oral QHS   umeclidinium bromide  1 puff Inhalation Daily   Zetta Bills, MD 04/19/2023, 9:56 AM

## 2023-04-19 NOTE — Progress Notes (Signed)
Pt. Refused bipap tonight, resting comfortably at this time

## 2023-04-19 NOTE — Progress Notes (Signed)
Occupational Therapy Treatment Patient Details Name: Tonya Glass MRN: [id number] DOB: 1945/05/18 Today's Date: 04/19/2023   History of present illness Tonya Glass is 77 y.o. admitted for acute on chronic heart failure exacerbation. Small LV thrombus noted with AKI and anemia. Pt requiring intermittent bipap. S/p R heart cath 12/4. Pertinent PMH/PSH includes HFpEF, HLD, Aortic stenosis s/p valve replacement, Sick sinus syndrome, Hypothyroidism, HTN, CKD, COPD, T2DM.   OT comments  Pt making steady progress towards OT goals. Hoped to use Stedy for standing ADLs at sink though pt required extended time for toileting task on [facility] during session. Some increased confusion noted today in comparison to prior OT session.       If plan is discharge home, recommend the following:  Two people to help with walking and/or transfers;Two people to help with bathing/dressing/bathroom;Assist for transportation;A lot of help with walking and/or transfers   Equipment Recommendations  Other (comment) (TBD)    Recommendations for Other Services      Precautions / Restrictions Precautions Precautions: Fall Precaution Comments: R radial access heart cath 12/4 Restrictions Weight Bearing Restrictions: No       Mobility Bed Mobility               General bed mobility comments: in recliner on entry    Transfers Overall transfer level: Needs assistance Equipment used: Ambulation equipment used Transfers: Sit to/from Stand, Bed to chair/wheelchair/BSC Sit to Stand: Min assist, +2 physical assistance, +2 safety/equipment           General transfer comment: Min A x 2 to stand from recliner into [place]. Once up, pt endorsed need to use BSC. Transferred to [facility] with Stedy Transfer via Lift Equipment: [employer] Overall balance assessment: Needs assistance Sitting-balance support: Bilateral upper extremity supported, Feet supported, Single extremity supported Sitting balance-Leahy Scale:  Fair     Standing balance support: Bilateral upper extremity supported, During functional activity Standing balance-Leahy Scale: Poor                             ADL either performed or assessed with clinical judgement   ADL Overall ADL's : Needs assistance/impaired                         Toilet Transfer: Agricultural engineer Details (indicate cue type and reason): use of Stedy to [facility] with Min A x 2 to get into Stedy and good control of hips to [facility]. Toileting- Clothing Manipulation and Hygiene: Total assistance;Sit to/from stand;+2 for safety/equipment Toileting - Clothing Manipulation Details (indicate cue type and reason): for cleanup after bowel incontinence            Extremity/Trunk Assessment Upper Extremity Assessment Upper Extremity Assessment: Generalized weakness;Right hand dominant   Lower Extremity Assessment Lower Extremity Assessment: Defer to PT evaluation        Vision   Vision Assessment?: No apparent visual deficits   Perception     Praxis      Cognition Arousal: Alert Behavior During Therapy: WFL for tasks assessed/performed Overall Cognitive Status: Impaired/Different from baseline Area of Impairment: Memory, Problem solving                     Memory: Decreased short-term memory       Problem Solving: Slow processing, Difficulty sequencing, Requires verbal cues General Comments: minor confusion noted in comparison to prior session, does endorse being sleepy with  alertness improvements noted with mobility. at end of session, pt with difficulty using phone to call daughter and said "good morning, afternoon, evening - whatever it is"        Exercises      Shoulder Instructions       General Comments      Pertinent Vitals/ Pain       Pain Assessment Pain Assessment: Faces Faces Pain Scale: Hurts little more Pain Location: B knees Pain Descriptors / Indicators: Grimacing, Guarding, Sore, Moaning,  Discomfort Pain Intervention(s): Monitored during session, Limited activity within patient's tolerance  Home Living                                          Prior Functioning/Environment              Frequency  Min 1X/week        Progress Toward Goals  OT Goals(current goals can now be found in the care plan section)  Progress towards OT goals: Progressing toward goals  Acute Rehab OT Goals Patient Stated Goal: feel better and not have to be readmitted OT Goal Formulation: With patient Time For Goal Achievement: 04/26/23 Potential to Achieve Goals: Good ADL Goals Pt Will Perform Lower Body Bathing: with mod assist;sit to/from stand;with adaptive equipment Pt Will Perform Lower Body Dressing: with mod assist;sit to/from stand;with adaptive equipment Pt Will Transfer to Toilet: with mod assist;stand pivot transfer;bedside commode Pt Will Perform Toileting - Clothing Manipulation and hygiene: with mod assist;sit to/from stand Pt/caregiver will Perform Home Exercise Program: Increased strength;Both right and left upper extremity;With written HEP provided;With Supervision;With theraband Additional ADL Goal #1: Pt will transfer supine to sit with mod assist in preparation for selfcare tasks.  Plan      Co-evaluation                 AM-PAC OT "6 Clicks" Daily Activity     Outcome Measure   Help from another person eating meals?: None Help from another person taking care of personal grooming?: A Little Help from another person toileting, which includes using toliet, bedpan, or urinal?: Total Help from another person bathing (including washing, rinsing, drying)?: Total Help from another person to put on and taking off regular upper body clothing?: A Little Help from another person to put on and taking off regular lower body clothing?: Total 6 Click Score: 13    End of Session Equipment Utilized During Treatment: Gait belt  OT Visit Diagnosis:  Unsteadiness on feet (R26.81);Other abnormalities of gait and mobility (R26.89);Pain Pain - Right/Left: Right Pain - part of body: Leg   Activity Tolerance Patient tolerated treatment well   Patient Left in chair;with call bell/phone within reach;with chair alarm set   Nurse Communication Mobility status        Time: 3220-2542 OT Time Calculation (min): 33 min  Charges: OT General Charges $OT Visit: 1 Visit OT Treatments $Self Care/Home Management : 23-37 mins  Bradd Canary, OTR/L Acute Rehab Services Office: [phone]   Lorre Munroe 04/19/2023, 2:50 PM

## 2023-04-19 NOTE — Assessment & Plan Note (Signed)
 Seen on echo 11/28. -Monitor fever curve -Continue heparin drip -Cardiology planning for L/R heart cath to evaluate for coronary artery embolism when clinically stable

## 2023-04-19 NOTE — Assessment & Plan Note (Addendum)
Saturations now remaining >88% on room air, does desaturate when when she drifts off to sleep, suspect patient require oxygen at night for home.  Really requires BiPAP when sleeping. -Finished 5 days of linezolid 600 mg -DuoNeb Q6h PRN

## 2023-04-19 NOTE — Assessment & Plan Note (Addendum)
RHC showing normal volume status yesterday, no further diuresis at this time.  Still net negative 8.5 L since admission. -Daily weights, strict I&Os -PT/OT evaluate and treat -Out of bed order with meals -Cardiology following, recs appreciated   -Hold Lasix drip to allow for renovascular equilibration -GDMT: Hydralazine 25 mg TID, Imdur 15 mg daily, carvedilol 3.125 mg BID -Follow up recs after RHC

## 2023-04-19 NOTE — Progress Notes (Signed)
Physical Therapy Treatment Patient Details Name: Tonya Glass MRN: [id number] DOB: 1946-03-23 Today's Date: 04/19/2023   History of Present Illness Tonya Glass is 77 y.o. admitted for acute on chronic heart failure exacerbation. Small LV thrombus noted with AKI and anemia. Pt requiring intermittent bipap. S/p R heart cath 12/4. Pertinent PMH/PSH includes HFpEF, HLD, Aortic stenosis s/p valve replacement, Sick sinus syndrome, Hypothyroidism, HTN, CKD, COPD, T2DM.    PT Comments  Pt is continuing to demonstrate deficits in overall strength and power, impacting her ease and independence with bed mobility and transfers. She required modA to roll, modAx2 to transition supine to sit EOB, and minAx2 to transfer to stand from a lower surface while using the stedy. She is at risk for falls. She also demonstrates deficits in memory, not seeming to recall techniques/cues from prior sessions when performing functional mobility. Will continue to follow acutely.     If plan is discharge home, recommend the following: Two people to help with walking and/or transfers;Two people to help with bathing/dressing/bathroom;Assistance with cooking/housework;Assist for transportation   Can travel by private vehicle     No  Equipment Recommendations  None recommended by PT    Recommendations for Other Services       Precautions / Restrictions Precautions Precautions: Fall Precaution Comments: R radial access heart cath 12/4 Restrictions Weight Bearing Restrictions: No     Mobility  Bed Mobility Overal bed mobility: Needs Assistance Bed Mobility: Supine to Sit, Rolling Rolling: Mod assist, Used rails   Supine to sit: Mod assist, +2 for physical assistance, +2 for safety/equipment, HOB elevated     General bed mobility comments: Cues provided to use rails and bring leg across other leg to roll, modA at trunk to rotate. ModAx2 needed to bring legs off R EOB and ascend trunk. Cued pt to limit R wrist  use due to heart cath 12/4.    Transfers Overall transfer level: Needs assistance Equipment used: Ambulation equipment used Transfers: Bed to chair/wheelchair/BSC, Sit to/from Stand Sit to Stand: Contact guard assist, +2 physical assistance, +2 safety/equipment, Min assist           General transfer comment: Cued pt with all mobility not to use or just use her her R UE minimally s/p cath access 12/4. Pt able to pull up on stedy with L UE and assistance at hips to extend and power up to stand. Pt required minAx2 to stand from EOB 1x. CGA needed to stand from stedy flaps Transfer via Lift Equipment: Stedy  Ambulation/Gait               General Gait Details: unable   Stairs             Wheelchair Mobility     Tilt Bed    Modified Rankin (Stroke Patients Only)       Balance Overall balance assessment: Needs assistance Sitting-balance support: Bilateral upper extremity supported, Feet supported, Single extremity supported Sitting balance-Leahy Scale: Poor Sitting balance - Comments: UE support.   Standing balance support: Single extremity supported, During functional activity Standing balance-Leahy Scale: Poor Standing balance comment: L UE support and external physical assistance to stand with stedy                            Cognition Arousal: Alert Behavior During Therapy: WFL for tasks assessed/performed Overall Cognitive Status: Impaired/Different from baseline Area of Impairment: Memory, Problem solving  Memory: Decreased short-term memory       Problem Solving: Slow processing, Difficulty sequencing, Requires verbal cues General Comments: intermittent slow processing and does not recall how she transfers from prior sessions        Exercises      General Comments        Pertinent Vitals/Pain Pain Assessment Pain Assessment: Faces Faces Pain Scale: Hurts little more Pain Location: L hip,  knees Pain Descriptors / Indicators: Grimacing, Guarding, Sore, Moaning, Discomfort Pain Intervention(s): Limited activity within patient's tolerance, Monitored during session, Repositioned    Home Living                          Prior Function            PT Goals (current goals can now be found in the care plan section) Acute Rehab PT Goals Patient Stated Goal: get better PT Goal Formulation: With patient Time For Goal Achievement: 04/28/23 Potential to Achieve Goals: Fair Progress towards PT goals: Progressing toward goals    Frequency    Min 1X/week      PT Plan      Co-evaluation              AM-PAC PT "6 Clicks" Mobility   Outcome Measure  Help needed turning from your back to your side while in a flat bed without using bedrails?: A Lot Help needed moving from lying on your back to sitting on the side of a flat bed without using bedrails?: Total Help needed moving to and from a bed to a chair (including a wheelchair)?: Total Help needed standing up from a chair using your arms (e.g., wheelchair or bedside chair)?: Total Help needed to walk in hospital room?: Total Help needed climbing 3-5 steps with a railing? : Total 6 Click Score: 7    End of Session Equipment Utilized During Treatment: Oxygen;Gait belt Activity Tolerance: Patient tolerated treatment well Patient left: with call bell/phone within reach;in chair;with chair alarm set Nurse Communication: Mobility status (NT) PT Visit Diagnosis: Other abnormalities of gait and mobility (R26.89);Muscle weakness (generalized) (M62.81);Pain;Unsteadiness on feet (R26.81);Difficulty in walking, not elsewhere classified (R26.2) Pain - Right/Left:  (bilateral) Pain - part of body: Ankle and joints of foot;Leg;Knee;Hip     Time: 4132-4401 PT Time Calculation (min) (ACUTE ONLY): 20 min  Charges:    $Therapeutic Activity: 8-22 mins PT General Charges $$ ACUTE PT VISIT: 1 Visit                      Virgil Benedict, PT, DPT Acute Rehabilitation Services  Office: [phone]    Bettina Gavia 04/19/2023, 12:52 PM

## 2023-04-19 NOTE — Assessment & Plan Note (Signed)
 LV apical thrombus noted on TTE 11/28. -Hold home Eliquis 5mg  BID -Currently on heparin drip for LV apical thrombus -AM BMP, mag; goal K>4, mag>2

## 2023-04-19 NOTE — Assessment & Plan Note (Signed)
 A1c 6.0.  CBG in last 24 hours have remained appropriate. -Continue sensitive SSI -Start 4 units Semglee starting tomorrow -Hold home Jardiance 10 mg in setting of AKI -Hold home glipizide 10 mg BID

## 2023-04-19 NOTE — Assessment & Plan Note (Addendum)
Hgb stable at 9.8.  S/p 1 unit PRBC on 12/1.  Recommend considering outpatient colonoscopy.  Low concern for GI bleed.  Is already on Carafate TID. -Continue home iron 325 mg daily -Transfuse <8.0 Hgb given she has has had significant UOP with diuresis -Pantoprazole 40 mg once daily -Trend daily hemoglobin on CBC

## 2023-04-19 NOTE — Assessment & Plan Note (Addendum)
Last BM 12/5. -Continue bowel regimen with MiraLAX and senna

## 2023-04-20 DIAGNOSIS — I5043 Acute on chronic combined systolic (congestive) and diastolic (congestive) heart failure: Secondary | ICD-10-CM | POA: Diagnosis not present

## 2023-04-20 LAB — BASIC METABOLIC PANEL
Anion gap: 11 (ref 5–15)
BUN: 59 mg/dL — ABNORMAL HIGH (ref 8–23)
CO2: 26 mmol/L (ref 22–32)
Calcium: 8.7 mg/dL — ABNORMAL LOW (ref 8.9–10.3)
Chloride: 101 mmol/L (ref 98–111)
Creatinine, Ser: 2.72 mg/dL — ABNORMAL HIGH (ref 0.44–1.00)
GFR, Estimated: 17 mL/min — ABNORMAL LOW (ref >60)
Glucose, Bld: 241 mg/dL — ABNORMAL HIGH (ref 70–99)
Potassium: 4.7 mmol/L (ref 3.5–5.1)
Sodium: 138 mmol/L (ref 135–145)

## 2023-04-20 LAB — CBC
HCT: 27.5 % — ABNORMAL LOW (ref 36.0–46.0)
HCT: 28.9 % — ABNORMAL LOW (ref 36.0–46.0)
Hemoglobin: 8.5 g/dL — ABNORMAL LOW (ref 12.0–15.0)
Hemoglobin: 8.7 g/dL — ABNORMAL LOW (ref 12.0–15.0)
MCH: 28.8 pg (ref 26.0–34.0)
MCH: 28.8 pg (ref 26.0–34.0)
MCHC: 30.9 g/dL (ref 30.0–36.0)
MCHC: 30.1 g/dL (ref 30.0–36.0)
MCV: 93.2 fL (ref 80.0–100.0)
MCV: 95.7 fL (ref 80.0–100.0)
Platelets: 335 10*3/uL (ref 150–400)
Platelets: 332 10*3/uL (ref 150–400)
RBC: 2.95 MIL/uL — ABNORMAL LOW (ref 3.87–5.11)
RBC: 3.02 MIL/uL — ABNORMAL LOW (ref 3.87–5.11)
RDW: 15 % (ref 11.5–15.5)
RDW: 15.3 % (ref 11.5–15.5)
WBC: 10.1 10*3/uL (ref 4.0–10.5)
WBC: 10.7 10*3/uL — ABNORMAL HIGH (ref 4.0–10.5)
nRBC: 0 % (ref 0.0–0.2)
nRBC: 0.2 % (ref 0.0–0.2)

## 2023-04-20 LAB — RENAL FUNCTION PANEL
Albumin: 2.6 g/dL — ABNORMAL LOW (ref 3.5–5.0)
Anion gap: 14 (ref 5–15)
BUN: 60 mg/dL — ABNORMAL HIGH (ref 8–23)
CO2: 29 mmol/L (ref 22–32)
Calcium: 8.7 mg/dL — ABNORMAL LOW (ref 8.9–10.3)
Chloride: 97 mmol/L — ABNORMAL LOW (ref 98–111)
Creatinine, Ser: 2.72 mg/dL — ABNORMAL HIGH (ref 0.44–1.00)
GFR, Estimated: 17 mL/min — ABNORMAL LOW (ref >60)
Glucose, Bld: 187 mg/dL — ABNORMAL HIGH (ref 70–99)
Phosphorus: 2.4 mg/dL — ABNORMAL LOW (ref 2.5–4.6)
Potassium: 3.2 mmol/L — ABNORMAL LOW (ref 3.5–5.1)
Sodium: 140 mmol/L (ref 135–145)

## 2023-04-20 LAB — GLUCOSE, CAPILLARY
Glucose-Capillary: 178 mg/dL — ABNORMAL HIGH (ref 70–99)
Glucose-Capillary: 171 mg/dL — ABNORMAL HIGH (ref 70–99)
Glucose-Capillary: 205 mg/dL — ABNORMAL HIGH (ref 70–99)

## 2023-04-20 LAB — MAGNESIUM: Magnesium: 2.7 mg/dL — ABNORMAL HIGH (ref 1.7–2.4)

## 2023-04-20 MED ORDER — POTASSIUM CHLORIDE 20 MEQ PO PACK
40.0000 meq | PACK | Freq: Two times a day (BID) | ORAL | Status: DC
  Administered 2023-04-20: 40 meq via ORAL
  Filled 2023-04-20: qty 2

## 2023-04-20 MED ORDER — POTASSIUM CHLORIDE 20 MEQ PO PACK
40.0000 meq | PACK | Freq: Once | ORAL | Status: AC
  Administered 2023-04-20: 40 meq via ORAL
  Filled 2023-04-20: qty 2

## 2023-04-20 MED ORDER — POTASSIUM CHLORIDE 20 MEQ PO PACK: 60.0000 meq | PACK | Freq: Once | ORAL | Status: DC

## 2023-04-20 NOTE — Assessment & Plan Note (Signed)
RHC showing normal volume status yesterday, no further diuresis at this time.  Net negative 8.6 L since admission. -Daily weights, strict I&Os -PT/OT evaluate and treat -Out of bed order with meals -Cardiology following, recs appreciated   -Hold Lasix drip to allow for renovascular equilibration -GDMT: Hydralazine 25 mg TID, Imdur 15 mg daily, carvedilol 3.125 mg BID

## 2023-04-20 NOTE — Assessment & Plan Note (Addendum)
Cr 2.61>2.72 baseline ~1.5-1.7.  Per nephrology, FENa indicates ineffective arterial blood volume, recommend supportive care and restarting maintenance dose diuretics when kidney function improves.  Do not recommend dialysis.   24 UOP 150 ml w/ 3 uncharted.  -Hold Jardiance and irbesartan for now -Avoid nephrotoxins -Encourage PO intake, nursing care order for 150 mL to be offered every 4 hours -Seen by RD for poor PO, started Ensure -Appreciate Nephrology recs, who will continue to follow -PM RFP to check electrolytes and phosphorus - K repleted today -Daily RFP per Nephrology

## 2023-04-20 NOTE — Assessment & Plan Note (Addendum)
LV apical thrombus noted on TTE 11/28. -Home Eliquis 5mg  BID -AM BMP, mag; goal K>4, mag>2

## 2023-04-20 NOTE — Assessment & Plan Note (Addendum)
Hgb 9.4>8.5.  S/p 1 unit PRBC on 12/1.  Recommend considering outpatient colonoscopy.  Low concern for GI bleed.  Is already on Carafate TID. -Getting IV iron infusions -Transfuse <8.0 Hgb  -Pantoprazole 40 mg once daily -pm CBC -Trend daily hemoglobin on CBC

## 2023-04-20 NOTE — Assessment & Plan Note (Signed)
Low concern for acute exacerbation.  Patient has now been adherent with BiPAP overnight. -Wean O2 as able with goal O2 88-92%, home baseline 4 L with exertion -DuoNebs every 4 hours PRN -Continue Dulera 2 puffs BID (on Symbicort formulary equivalent) -Continue guaifenesin 600 mg BID -Nightly BiPAP

## 2023-04-20 NOTE — Progress Notes (Signed)
Daily Progress Note Intern Pager: [phone]  Patient name: Tonya Glass Medical record number: [id number] Date of birth: Dec 02, 1945 Age: 77 y.o. Gender: female  Primary Care Provider: No primary care provider on file. Consultants: Cardiology, nephrology Code Status: DNR/DNI  Pt Overview and Major Events to Date:  11/27 - Admitted 12/1 - Hemoglobin <8, transfused 1 unit PRBC, Cr trending up 12/2 - Lasix gtt stopped, no further diuresis 12/4 - RHC euvolemic 12/5 - Nephrology consult  Assessment and Plan: Tonya Glass is a 77 y.o. female with a pertinent PMH of HFpEF, HLD, aortic stenosis s/p valve replacement, sick sinus syndrome, hypothyroidism, HTN, CKD stage IV, T2DM, and COPD on home oxygen 4L with exertion who presented with dyspnea and was admitted for CHF exacerbation, currently complicated by creatinine elevation.  Assessment & Plan Acute exacerbation of CHF (congestive heart failure) (HCC) RHC showing normal volume status yesterday, no further diuresis at this time.  Net negative 8.6 L since admission. -Daily weights, strict I&Os -PT/OT evaluate and treat -Out of bed order with meals -Cardiology following, recs appreciated   -Hold Lasix drip to allow for renovascular equilibration -GDMT: Hydralazine 25 mg TID, Imdur 15 mg daily, carvedilol 3.125 mg BID  Acute kidney injury 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) Cr 2.61>2.72 baseline ~1.5-1.7.  Per nephrology, FENa indicates ineffective arterial blood volume, recommend supportive care and restarting maintenance dose diuretics when kidney function improves.  Do not recommend dialysis.   24 UOP 150 ml w/ 3 uncharted.  -Hold Jardiance and irbesartan for now -Avoid nephrotoxins -Encourage PO intake, nursing care order for 150 mL to be offered every 4 hours -Seen by RD for poor PO, started Ensure -Appreciate Nephrology recs, who will continue to follow -PM RFP to check electrolytes and phosphorus - K  repleted today -Daily RFP per Nephrology Acute on chronic hypoxic respiratory failure (HCC) Saturations now remaining >88% on room air, does desaturate when when she drifts off to sleep, suspect patient require oxygen at night for home.  Really requires BiPAP when sleeping. -Finished 5 days of linezolid 600 mg -DuoNeb Q6h PRN Anemia Hgb 9.4>8.5.  S/p 1 unit PRBC on 12/1.  Recommend considering outpatient colonoscopy.  Low concern for GI bleed.  Is already on Carafate TID. -Getting IV iron infusions -Transfuse <8.0 Hgb  -Pantoprazole 40 mg once daily -pm CBC -Trend daily hemoglobin on CBC LV thrombus Seen on echo 11/28. -Monitor fever curve -Continue Eliquis 5 mg twice daily -Cardiology planning for L/R heart cath to evaluate for coronary artery embolism when clinically stable COPD (chronic obstructive pulmonary disease) (HCC) Low concern for acute exacerbation.  Patient has now been adherent with BiPAP overnight. -Wean O2 as able with goal O2 88-92%, home baseline 4 L with exertion -DuoNebs every 4 hours PRN -Continue Dulera 2 puffs BID (on Symbicort formulary equivalent) -Continue guaifenesin 600 mg BID -Nightly BiPAP DM2 (diabetes mellitus, type 2) (HCC) A1c 6.0.  CBG in last 24 hours have remained appropriate. -Continue sensitive SSI - 4 units Semglee daily -Hold home Jardiance 10 mg in setting of AKI -Hold home glipizide 10 mg BID Paroxysmal atrial flutter (HCC) LV apical thrombus noted on TTE 11/28. -Home Eliquis 5mg  BID -AM BMP, mag; goal K>4, mag>2 Constipation Last BM 12/5. -Continue bowel regimen with MiraLAX and senna   Chronic and Stable Issues: Hypothyroidism: Continue levothyroxine 75 mcg daily Depression: Continue fluoxetine 40 mg daily, decreased trazodone to 25 mg nightly given somnolence Peripheral neuropathy: Continue gabapentin 600 mg nightly  HLD: Continue home atorvastatin 40 mg daily Wounds to right leg: Wound care following continue compression wraps  bilateral legs GERD: Continue home carafate 1 g TID, descalate to pantoprazole 40 mg ONCE daily  FEN/GI: Renal diet PPx: Heparin Dispo: SNF pending continued medical management  Subjective:  Patient states he is feeling well this morning, denies any shortness of breath.  Eating breakfast.  Objective: Temp:  [97.7 F (36.5 C)-98.7 F (37.1 C)] 97.7 F (36.5 C) (12/07 0745) Pulse Rate:  [76-83] 83 (12/07 0549) Resp:  [18-20] 19 (12/07 0745) BP: (114-146)/(42-62) 116/42 (12/07 0745) SpO2:  [90 %-93 %] 93 % (12/07 0549) Weight:  [93.5 kg] 93.5 kg (12/07 0549) Physical Exam: General: 77 year old female, sitting up in bed eating, pleasant Cardiovascular: RRR Respiratory: CTAB anteriorly, breathing comfortably on room air Extremities: Chronic skin changes of BLEs with erythema and wounds covered with bandages, trace pitting edema bilaterally  Laboratory: Most recent CBC Lab Results  Component Value Date   WBC 10.1 04/20/2023   HGB 8.5 (L) 04/20/2023   HCT 27.5 (L) 04/20/2023   MCV 93.2 04/20/2023   PLT 335 04/20/2023   Most recent BMP    Latest Ref Rng & Units 04/20/2023    2:26 AM  BMP  Glucose 70 - 99 mg/dL 161   BUN 8 - 23 mg/dL 60   Creatinine 0.96 - 1.00 mg/dL 0.45   Sodium 409 - 811 mmol/L 140   Potassium 3.5 - 5.1 mmol/L 3.2   Chloride 98 - 111 mmol/L 97   CO2 22 - 32 mmol/L 29   Calcium 8.9 - 10.3 mg/dL 8.7    Erick Alley, DO 04/20/2023, 8:04 AM  PGY-3, [facility] FPTS Intern pager: [phone], text pages welcome Secure chat group [facility] Teaching Service

## 2023-04-20 NOTE — Assessment & Plan Note (Addendum)
Seen on echo 11/28. -Monitor fever curve -Continue Eliquis 5 mg twice daily -Cardiology planning for L/R heart cath to evaluate for coronary artery embolism when clinically stable

## 2023-04-20 NOTE — Assessment & Plan Note (Signed)
Last BM 12/5. -Continue bowel regimen with MiraLAX and senna

## 2023-04-20 NOTE — Assessment & Plan Note (Signed)
Saturations now remaining >88% on room air, does desaturate when when she drifts off to sleep, suspect patient require oxygen at night for home.  Really requires BiPAP when sleeping. -Finished 5 days of linezolid 600 mg -DuoNeb Q6h PRN

## 2023-04-20 NOTE — Progress Notes (Signed)
[facility] Progress Note   Assessment/ Plan:   1. Acute kidney Injury on chronic kidney disease stage IIIB: Suspected to be hemodynamically mediated/cardiorenal in the setting of new acute HFrEF.  ARB and SGLT2 inhibitor currently on hold.  Overnight with slight worsening of renal function and poorly charted urine output that appears to be in oliguric range.  She does not appear to have evidence of pulmonary edema on physical exam to prompt diuretic dosing today. 2.  Hypokalemia: Possibly secondary to diuretic induced losses, magnesium level elevated/adequate.  Oral replacement ordered earlier by primary service.   3.  Anemia: Likely secondary to chronic illness, without overt loss and with low iron levels when checked on 11/28.  Getting intravenous iron. 4.  History of bioprosthetic aortic valve replacement valve: With recently recognized LV thrombus and currently on apixaban.  Subjective:   Denies any acute events overnight, denies shortness of breath or chest pain.   Objective:   BP (!) 116/42 (BP Location: Left Arm)   Pulse 83   Temp 97.7 F (36.5 C) (Oral)   Resp 19   Ht 5' (1.524 m)   Wt 93.5 kg   SpO2 93%   BMI 40.26 kg/m   Intake/Output Summary (Last 24 hours) at 04/20/2023 0856 Last data filed at 04/20/2023 0818 Gross per 24 hour  Intake 440 ml  Output 150 ml  Net 290 ml   Weight change: -1.7 kg  Physical Exam: Gen: Comfortably sitting up in bed, oxygen via nasal cannula CVS: Pulse regular, normal rate, S1 and S2 normal Resp: No rales, expiratory rhonchi audible (clear with coughing) Abd: Soft, flat, nontender, bowel sounds normal Ext: 1+ bilateral lower extremity edema with venous stasis changes  Imaging: No results found.  Labs: BMET Recent Labs  Lab 04/15/23 0655 04/16/23 0650 04/17/23 0306 04/17/23 1220 04/18/23 0238 04/19/23 0254 04/19/23 1401 04/20/23 0226  NA 138 139 137 139  137 140 136 139 140  K 3.5 3.9 4.1 3.1*  3.3* 4.9 2.9*  3.6 3.2*  CL 87* 91* 93*  --  95* 92* 95* 97*  CO2 36* 33* 32  --  30 31 30 29   GLUCOSE 205* 193* 197*  --  172* 156* 193* 187*  BUN 34* 41* 46*  --  54* 53* 55* 60*  CREATININE 2.17* 2.26* 2.39*  --  2.52* 2.66* 2.61* 2.72*  CALCIUM 8.7* 8.9 8.8*  --  8.8* 8.9 9.1 8.7*  PHOS  --   --   --   --   --   --  2.6 2.4*   CBC Recent Labs  Lab 04/17/23 0306 04/17/23 1220 04/18/23 0238 04/19/23 0254 04/20/23 0226  WBC 10.2  --  11.4* 10.5 10.1  HGB 9.5* 10.2*  10.9* 9.8* 9.4* 8.5*  HCT 30.8* 30.0*  32.0* 31.5* 30.3* 27.5*  MCV 92.8  --  95.2 92.9 93.2  PLT 269  --  272 323 335    Medications:     apixaban  5 mg Oral BID   atorvastatin  40 mg Oral Daily   carvedilol  3.125 mg Oral BID WC   feeding supplement  237 mL Oral Q24H   FLUoxetine  40 mg Oral Daily   gabapentin  600 mg Oral QHS   hydrALAZINE  25 mg Oral Q8H   insulin aspart  0-9 Units Subcutaneous TID WC   insulin glargine-yfgn  4 Units Subcutaneous Daily   isosorbide mononitrate  15 mg Oral Daily   levothyroxine  75  mcg Oral Daily   mometasone-formoterol  2 puff Inhalation BID   multivitamin with minerals  1 tablet Oral Daily   pantoprazole  40 mg Oral Daily   polyethylene glycol  17 g Oral BID   potassium chloride  40 mEq Oral Once   senna  1 tablet Oral Daily   sodium chloride flush  3 mL Intravenous Q12H   sucralfate  1 g Oral TID WC & HS   traZODone  25 mg Oral QHS   umeclidinium bromide  1 puff Inhalation Daily   Zetta Bills, MD 04/20/2023, 8:56 AM

## 2023-04-20 NOTE — Assessment & Plan Note (Signed)
A1c 6.0.  CBG in last 24 hours have remained appropriate. -Continue sensitive SSI - 4 units Semglee daily -Hold home Jardiance 10 mg in setting of AKI -Hold home glipizide 10 mg BID

## 2023-04-20 NOTE — Plan of Care (Signed)

## 2023-04-21 DIAGNOSIS — R0602 Shortness of breath: Secondary | ICD-10-CM | POA: Diagnosis not present

## 2023-04-21 DIAGNOSIS — I5043 Acute on chronic combined systolic (congestive) and diastolic (congestive) heart failure: Secondary | ICD-10-CM | POA: Diagnosis not present

## 2023-04-21 LAB — CBC
HCT: 30.7 % — ABNORMAL LOW (ref 36.0–46.0)
Hemoglobin: 9.3 g/dL — ABNORMAL LOW (ref 12.0–15.0)
MCH: 28.9 pg (ref 26.0–34.0)
MCHC: 30.3 g/dL (ref 30.0–36.0)
MCV: 95.3 fL (ref 80.0–100.0)
Platelets: 347 10*3/uL (ref 150–400)
RBC: 3.22 MIL/uL — ABNORMAL LOW (ref 3.87–5.11)
RDW: 15.3 % (ref 11.5–15.5)
WBC: 10.2 10*3/uL (ref 4.0–10.5)
nRBC: 0.3 % — ABNORMAL HIGH (ref 0.0–0.2)

## 2023-04-21 LAB — GLUCOSE, CAPILLARY
Glucose-Capillary: 171 mg/dL — ABNORMAL HIGH (ref 70–99)
Glucose-Capillary: 206 mg/dL — ABNORMAL HIGH (ref 70–99)
Glucose-Capillary: 203 mg/dL — ABNORMAL HIGH (ref 70–99)

## 2023-04-21 LAB — BASIC METABOLIC PANEL
Anion gap: 12 (ref 5–15)
BUN: 55 mg/dL — ABNORMAL HIGH (ref 8–23)
CO2: 27 mmol/L (ref 22–32)
Calcium: 8.9 mg/dL (ref 8.9–10.3)
Chloride: 101 mmol/L (ref 98–111)
Creatinine, Ser: 2.49 mg/dL — ABNORMAL HIGH (ref 0.44–1.00)
GFR, Estimated: 19 mL/min — ABNORMAL LOW (ref >60)
Glucose, Bld: 176 mg/dL — ABNORMAL HIGH (ref 70–99)
Potassium: 3.9 mmol/L (ref 3.5–5.1)
Sodium: 140 mmol/L (ref 135–145)

## 2023-04-21 LAB — MAGNESIUM: Magnesium: 2.8 mg/dL — ABNORMAL HIGH (ref 1.7–2.4)

## 2023-04-21 MED ORDER — POTASSIUM CHLORIDE CRYS ER 10 MEQ PO TBCR
10.0000 meq | EXTENDED_RELEASE_TABLET | Freq: Once | ORAL | Status: AC
  Administered 2023-04-21: 10 meq via ORAL
  Filled 2023-04-21: qty 1

## 2023-04-21 NOTE — Assessment & Plan Note (Addendum)
LV apical thrombus noted on TTE 11/28. -Home Eliquis 5 mg BID -AM BMP, mag; goal K>4, mag>2

## 2023-04-21 NOTE — Assessment & Plan Note (Addendum)
Last BM 12/7. -Continue bowel regimen with MiraLAX and senna

## 2023-04-21 NOTE — Assessment & Plan Note (Deleted)
Seen on echo 11/28. -Monitor fever curve -Continue Eliquis 5 mg twice daily -Cardiology planning for L/R heart cath to evaluate for coronary artery embolism when clinically stable

## 2023-04-21 NOTE — Assessment & Plan Note (Signed)
A1c 6.0.  CBG in last 24 hours have remained appropriate. -Continue sensitive SSI - 4 units Semglee daily -Hold home Jardiance 10 mg in setting of AKI -Hold home glipizide 10 mg BID

## 2023-04-21 NOTE — Assessment & Plan Note (Signed)
Low concern for acute exacerbation.  Patient has now been adherent with BiPAP overnight. -Wean O2 as able with goal O2 88-92%, home baseline 4 L with exertion -DuoNebs every 4 hours PRN -Continue Dulera 2 puffs BID (on Symbicort formulary equivalent) -Continue guaifenesin 600 mg BID -Nightly BiPAP

## 2023-04-21 NOTE — Assessment & Plan Note (Addendum)
Hgb 9.4>8.5.  S/p 1 unit PRBC on 12/1, now receiving IV iron per Nephro.  Recommend considering outpatient colonoscopy, though more likely chronic disease.  Low concern for GI bleed.  Is already on Carafate TID. -Getting IV iron infusions -Transfuse <8.0 Hgb  -Pantoprazole 40 mg once daily -CBC every other day

## 2023-04-21 NOTE — Assessment & Plan Note (Signed)
Saturations now remaining >88% on room air, does desaturate when when she drifts off to sleep, suspect patient require oxygen at night for home.  Really requires BiPAP when sleeping. -Finished 5 days of linezolid 600 mg -DuoNeb Q6h PRN

## 2023-04-21 NOTE — Progress Notes (Signed)
Daily Progress Note Intern Pager: [phone]  Patient name: Tonya Glass Medical record number: [id number] Date of birth: 13-Jun-1945 Age: 77 y.o. Gender: female  Primary Care Provider: No primary care provider on file. Consultants: Cardiology, Nephrology Code Status: DNR/DNI  Pt Overview and Major Events to Date:  11/27 - Admitted 12/1 - Hemoglobin <8, transfused 1 unit PRBC, Cr trending up 12/2 - Lasix gtt stopped, no further diuresis 12/4 - RHC euvolemic 12/5 - Nephrology consult   Assessment and Plan: Tonya Glass is a 77 y.o. female with a pertinent PMH of HFpEF, HLD, aortic stenosis s/p valve replacement, sick sinus syndrome, hypothyroidism, HTN, CKD stage IV, T2DM, and COPD on home oxygen 4L with exertion who presented with dyspnea and was admitted for CHF exacerbation, currently complicated by creatinine elevation which is now finally downtrending.  Creatinine is now finally improving after holding diuresis to allow for ATN resolution and providing PO hydration.  Continue to encourage p.o. intake, Ensure now ordered but patient is refusing to drink them.  Protein intake would facilitate recovery of pressure ulcers and facilitate strengthening.  Will discharge to SNF once more certain that creatinine is improving consistently. Assessment & Plan Acute exacerbation of CHF (congestive heart failure) (HCC) Euvolemic, no further diuresis at this time.  Net negative 8.4 L since admission, UOP likely inaccurate.  Will need to restart maintenance dose diuretics when kidney function further improves. -Daily weights, strict I&Os -PT/OT evaluate and treat -Out of bed order with meals -Cardiology following, recs appreciated  -GDMT: Hydralazine 25 mg TID, Imdur 15 mg daily, carvedilol 3.125 mg BID Acute kidney injury 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) Cr 2.72>2.49, baseline ~1.5-1.7, and finally improving.  UOP inaccurate overnight.  Now progressing towards  discharge. -Hold Jardiance and irbesartan for now -Avoid nephrotoxins -Encourage PO intake, nursing care order for 150 mL to be offered every 4 hours -Seen by RD for poor PO, started Ensure -Appreciate Nephrology recs, who will continue to follow -PM RFP to check electrolytes and phosphorus - K repleted today -Daily RFP per Nephrology Acute on chronic hypoxic respiratory failure (HCC) Saturations now remaining >88% on room air, does desaturate when when she drifts off to sleep, suspect patient require oxygen at night for home.  Really requires BiPAP when sleeping. -Finished 5 days of linezolid 600 mg -DuoNeb Q6h PRN Anemia Hgb 9.4>8.5.  S/p 1 unit PRBC on 12/1, now receiving IV iron per Nephro.  Recommend considering outpatient colonoscopy, though more likely chronic disease.  Low concern for GI bleed.  Is already on Carafate TID. -Getting IV iron infusions -Transfuse <8.0 Hgb  -Pantoprazole 40 mg once daily -CBC every other day COPD (chronic obstructive pulmonary disease) (HCC) Low concern for acute exacerbation.  Patient has now been adherent with BiPAP overnight. -Wean O2 as able with goal O2 88-92%, home baseline 4 L with exertion -DuoNebs every 4 hours PRN -Continue Dulera 2 puffs BID (on Symbicort formulary equivalent) -Continue guaifenesin 600 mg BID -Nightly BiPAP DM2 (diabetes mellitus, type 2) (HCC) A1c 6.0.  CBG in last 24 hours have remained appropriate. -Continue sensitive SSI - 4 units Semglee daily -Hold home Jardiance 10 mg in setting of AKI -Hold home glipizide 10 mg BID Paroxysmal atrial flutter (HCC) LV apical thrombus noted on TTE 11/28. -Home Eliquis 5 mg BID -AM BMP, mag; goal K>4, mag>2 Constipation Last BM 12/7. -Continue bowel regimen with MiraLAX and senna  Chronic and Stable Problems: Hypothyroidism: Continue levothyroxine 75 mcg daily Depression: Continue  fluoxetine 40 mg daily, decreased trazodone to 25 mg nightly given somnolence Peripheral  neuropathy: Continue gabapentin 600 mg nightly HLD: Continue home atorvastatin 40 mg daily Wounds to right leg: Wound care following continue compression wraps bilateral legs GERD: Continue home carafate 1 g TID, descalate to pantoprazole 40 mg ONCE daily  FEN/GI: Renal diet PPx: Heparin TID Dispo: SNF pending clinical improvement of creatinine.  Subjective:  Patient this morning states she is feeling well overall.  Has been attempting to drink more fluids when offered.  Has a bagel in front of her with cream cheese cup, does forget some parts of the conversation throughout evaluation, repeats self occasionally.  Patient does report some mild left heel pain, no skin breakdown noted on exam but a little bit of erythema at heel and his pillow placed to offload pressure.  Objective: Temp:  [97.6 F (36.4 C)-98.2 F (36.8 C)] 98.2 F (36.8 C) (12/08 0417) Pulse Rate:  [75-87] 75 (12/08 0417) Resp:  [18-23] 20 (12/08 0417) BP: (116-148)/(42-76) 134/46 (12/08 0417) SpO2:  [92 %-96 %] 96 % (12/08 0417) Weight:  [91.5 kg] 91.5 kg (12/08 0344)  Physical Exam: General: Elderly, deconditioned, chronically ill.  Resting comfortably in bed, NAD, alert and at baseline. Cardiovascular: No JVD.  Regular rate and rhythm. Normal S1/S2. No murmurs, rubs, or gallops appreciated. 2+ radial pulses. Pulmonary: Clear bilaterally to ascultation. No increased WOB, no accessory muscle usage on room air. No wheezes, rales, or crackles. Skin: Warm and dry.  Dressings over bilateral hips dry and nondraining. Extremities: Trace peripheral edema bilaterally, mild erythema of left heel over calcaneus, no skin breakdown.  Laboratory: Most recent CBC Lab Results  Component Value Date   WBC 10.2 04/21/2023   HGB 9.3 (L) 04/21/2023   HCT 30.7 (L) 04/21/2023   MCV 95.3 04/21/2023   PLT 347 04/21/2023   Most recent BMP    Latest Ref Rng & Units 04/21/2023    2:37 AM  BMP  Glucose 70 - 99 mg/dL 782   BUN 8 -  23 mg/dL 55   Creatinine 9.56 - 1.00 mg/dL 2.13   Sodium 086 - 578 mmol/L 140   Potassium 3.5 - 5.1 mmol/L 3.9   Chloride 98 - 111 mmol/L 101   CO2 22 - 32 mmol/L 27   Calcium 8.9 - 10.3 mg/dL 8.9     Other pertinent labs: -None  New Imaging/Diagnostic Tests: -None  Delorean Knutzen, MD 04/21/2023, 7:21 AM  PGY-1, [facility] FPTS Intern pager: [phone], text pages welcome Secure chat group [facility] Teaching Service

## 2023-04-21 NOTE — Progress Notes (Signed)
[facility] Progress Note   Assessment/ Plan:   1. Acute kidney Injury on chronic kidney disease stage IIIB: Suspected to be hemodynamically mediated/cardiorenal in the setting of new acute HFrEF.  ARB and SGLT2 inhibitor currently on hold.  Overnight with slight improvement of renal function with likely inaccurately charted urine output.  Lungs remain clear and without indication for diuretics at this time.  Continue surveillance of labs for renal function monitoring anticipating recovery is underway. 2.  Hypokalemia: Possibly secondary to diuretic induced losses, magnesium level elevated/adequate.  Corrected with replacement. 3.  Anemia: Likely secondary to chronic illness, without overt loss and with low iron levels when checked on 11/28.  Getting intravenous iron. 4.  History of bioprosthetic aortic valve replacement valve: With recently recognized LV thrombus and currently on apixaban.  Subjective:   Reports that she is feeling better and denies any acute events overnight.   Objective:   BP (!) 141/75 (BP Location: Left Arm)   Pulse 75   Temp 98.5 F (36.9 C) (Oral)   Resp 18   Ht 5' (1.524 m)   Wt 91.5 kg   SpO2 99%   BMI 39.40 kg/m   Intake/Output Summary (Last 24 hours) at 04/21/2023 1610 Last data filed at 04/20/2023 2000 Gross per 24 hour  Intake 120 ml  Output 100 ml  Net 20 ml   Weight change: -2 kg  Physical Exam: Gen: Comfortably sitting up in bed without oxygen supplementation CVS: Pulse regular, normal rate, S1 and S2 normal Resp: No rales, expiratory rhonchi audible (clear with coughing) Abd: Soft, flat, nontender, bowel sounds normal Ext: 1+ bilateral lower extremity edema with venous stasis changes  Imaging: No results found.  Labs: BMET Recent Labs  Lab 04/17/23 0306 04/17/23 1220 04/18/23 0238 04/19/23 0254 04/19/23 1401 04/20/23 0226 04/20/23 1455 04/21/23 0237  NA 137 139  137 140 136 139 140 138 140  K 4.1 3.1*  3.3* 4.9  2.9* 3.6 3.2* 4.7 3.9  CL 93*  --  95* 92* 95* 97* 101 101  CO2 32  --  30 31 30 29 26 27   GLUCOSE 197*  --  172* 156* 193* 187* 241* 176*  BUN 46*  --  54* 53* 55* 60* 59* 55*  CREATININE 2.39*  --  2.52* 2.66* 2.61* 2.72* 2.72* 2.49*  CALCIUM 8.8*  --  8.8* 8.9 9.1 8.7* 8.7* 8.9  PHOS  --   --   --   --  2.6 2.4*  --   --    CBC Recent Labs  Lab 04/19/23 0254 04/20/23 0226 04/20/23 1455 04/21/23 0237  WBC 10.5 10.1 10.7* 10.2  HGB 9.4* 8.5* 8.7* 9.3*  HCT 30.3* 27.5* 28.9* 30.7*  MCV 92.9 93.2 95.7 95.3  PLT 323 335 332 347    Medications:     apixaban  5 mg Oral BID   atorvastatin  40 mg Oral Daily   carvedilol  3.125 mg Oral BID WC   feeding supplement  237 mL Oral Q24H   FLUoxetine  40 mg Oral Daily   gabapentin  600 mg Oral QHS   hydrALAZINE  25 mg Oral Q8H   insulin aspart  0-9 Units Subcutaneous TID WC   insulin glargine-yfgn  4 Units Subcutaneous Daily   isosorbide mononitrate  15 mg Oral Daily   levothyroxine  75 mcg Oral Daily   mometasone-formoterol  2 puff Inhalation BID   multivitamin with minerals  1 tablet Oral Daily   pantoprazole  40 mg Oral Daily   sodium chloride flush  3 mL Intravenous Q12H   sucralfate  1 g Oral TID WC & HS   traZODone  25 mg Oral QHS   umeclidinium bromide  1 puff Inhalation Daily   Zetta Bills, MD 04/21/2023, 9:23 AM

## 2023-04-21 NOTE — Assessment & Plan Note (Addendum)
Cr 2.72>2.49, baseline ~1.5-1.7, and finally improving.  UOP inaccurate overnight.  Now progressing towards discharge. -Hold Jardiance and irbesartan for now -Avoid nephrotoxins -Encourage PO intake, nursing care order for 150 mL to be offered every 4 hours -Seen by RD for poor PO, started Ensure -Appreciate Nephrology recs, who will continue to follow -PM RFP to check electrolytes and phosphorus - K repleted today -Daily RFP per Nephrology

## 2023-04-21 NOTE — Assessment & Plan Note (Addendum)
Euvolemic, no further diuresis at this time.  Net negative 8.4 L since admission, UOP likely inaccurate.  Will need to restart maintenance dose diuretics when kidney function further improves. -Daily weights, strict I&Os -PT/OT evaluate and treat -Out of bed order with meals -Cardiology following, recs appreciated  -GDMT: Hydralazine 25 mg TID, Imdur 15 mg daily, carvedilol 3.125 mg BID

## 2023-04-22 ENCOUNTER — Encounter (HOSPITAL_COMMUNITY): Payer: Self-pay | Admitting: Family Medicine

## 2023-04-22 DIAGNOSIS — I5043 Acute on chronic combined systolic (congestive) and diastolic (congestive) heart failure: Secondary | ICD-10-CM | POA: Diagnosis not present

## 2023-04-22 LAB — GLUCOSE, CAPILLARY
Glucose-Capillary: 196 mg/dL — ABNORMAL HIGH (ref 70–99)
Glucose-Capillary: 152 mg/dL — ABNORMAL HIGH (ref 70–99)
Glucose-Capillary: 167 mg/dL — ABNORMAL HIGH (ref 70–99)

## 2023-04-22 LAB — RENAL FUNCTION PANEL
Albumin: 2.7 g/dL — ABNORMAL LOW (ref 3.5–5.0)
Anion gap: 10 (ref 5–15)
BUN: 52 mg/dL — ABNORMAL HIGH (ref 8–23)
CO2: 28 mmol/L (ref 22–32)
Calcium: 8.7 mg/dL — ABNORMAL LOW (ref 8.9–10.3)
Chloride: 101 mmol/L (ref 98–111)
Creatinine, Ser: 2.4 mg/dL — ABNORMAL HIGH (ref 0.44–1.00)
GFR, Estimated: 20 mL/min — ABNORMAL LOW (ref >60)
Glucose, Bld: 238 mg/dL — ABNORMAL HIGH (ref 70–99)
Phosphorus: 2.4 mg/dL — ABNORMAL LOW (ref 2.5–4.6)
Potassium: 3.3 mmol/L — ABNORMAL LOW (ref 3.5–5.1)
Sodium: 139 mmol/L (ref 135–145)

## 2023-04-22 LAB — MAGNESIUM: Magnesium: 2.6 mg/dL — ABNORMAL HIGH (ref 1.7–2.4)

## 2023-04-22 MED ORDER — INSULIN GLARGINE-YFGN 100 UNIT/ML ~~LOC~~ SOLN
6.0000 [IU] | Freq: Every day | SUBCUTANEOUS | Status: DC
Start: 2023-04-23 — End: 2023-04-24
  Administered 2023-04-23 – 2023-04-24 (×2): 6 [IU] via SUBCUTANEOUS
  Filled 2023-04-22 (×2): qty 0.06

## 2023-04-22 MED ORDER — POTASSIUM CHLORIDE CRYS ER 20 MEQ PO TBCR
40.0000 meq | EXTENDED_RELEASE_TABLET | Freq: Once | ORAL | Status: AC
  Administered 2023-04-22: 40 meq via ORAL
  Filled 2023-04-22: qty 2

## 2023-04-22 MED ORDER — TORSEMIDE 20 MG PO TABS
60.0000 mg | ORAL_TABLET | Freq: Two times a day (BID) | ORAL | Status: DC
  Administered 2023-04-22 – 2023-04-24 (×4): 60 mg via ORAL
  Filled 2023-04-22 (×4): qty 3

## 2023-04-22 MED ORDER — POTASSIUM CHLORIDE CRYS ER 20 MEQ PO TBCR
40.0000 meq | EXTENDED_RELEASE_TABLET | Freq: Once | ORAL | Status: DC
Start: 2023-04-22 — End: 2023-04-22

## 2023-04-22 NOTE — Assessment & Plan Note (Addendum)
Hemoglobin stable.  S/p 1 unit PRBC on 12/1, now receiving IV iron per Nephro.  Recommend considering outpatient colonoscopy depending on goals of care, though more likely chronic disease.  Low concern for GI bleed.  Is already on Carafate TID. -Getting IV iron infusions -Transfuse <8.0 Hgb  -Pantoprazole 40 mg once daily -CBC every other day

## 2023-04-22 NOTE — Progress Notes (Signed)
Daily Progress Note Intern Pager: [phone]  Patient name: Tonya Glass Medical record number: [id number] Date of birth: [birth year] Age: 77 y.o. Gender: female  Primary Care Provider: No primary care provider on file. Consultants: Cardiology, Nephrology  Code Status: DNR/DNI  Pt Overview and Major Events to Date:  11/27 - Admitted 12/1 - Hemoglobin <8, transfused 1 unit PRBC, Cr trending up 12/2 - Lasix gtt stopped, no further diuresis 12/4 - RHC euvolemic 12/5 - Nephrology consult for persistently elevated creatinine 12/9 - Creatinine downtrending, restarting torsemide for discharge home  Assessment and Plan: Tonya Glass is a 77 y.o. female with a pertinent PMH of HFpEF, HLD, aortic stenosis s/p valve replacement, sick sinus syndrome, hypothyroidism, HTN, CKD stage IV, T2DM, and COPD on home oxygen 4L with exertion who presented with dyspnea and was admitted for CHF exacerbation, currently complicated by creatinine elevation which is now finally downtrending.  Per nephrology, creatinine is now stable and will restart 60 mg twice daily of torsemide to evaluate for toleration.  If stable may discharge tomorrow.  Will discharge to SNF at [facility] where she has a known bed, but will require insurance authorization with TOC once discharge is planned. Assessment & Plan Acute exacerbation of CHF (congestive heart failure) (HCC) Euvolemic, no further diuresis at this time.  Net negative 8.5 L since admission, UOP likely inaccurate.  Restarting partial dose maintenance diuretics today, on 100 mg torsemide BID at home. -Daily weights, strict I&Os -PT/OT evaluate and treat -Out of bed order with meals -Restart torsemide 60 mg twice daily, assess for tolerance -Cardiology following, recs appreciated -GDMT: Hydralazine 25 mg TID, Imdur 15 mg daily, carvedilol 3.125 mg BID Acute kidney injury 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) Creatinine peaked and downtrending  gradually, baseline ~1.5-1.7.  UOP inaccurate overnight.  Now progressing towards discharge. -Hold Jardiance and irbesartan for now, restart at discharge or outpatient depending on creatinine -Avoid nephrotoxins -Encourage PO intake, nursing care order for 150 mL to be offered every 4 hours -Seen by RD for poor PO, started Ensure -Appreciate Nephrology recs, who will continue to follow -PM RFP to check electrolytes and phosphorus - K repleted today -Daily RFP per Nephrology Acute on chronic hypoxic respiratory failure (HCC) Saturations now remaining >88% on room air, does desaturate when when she drifts off to sleep, suspect patient require oxygen at night for home.  Really requires BiPAP when sleeping. -Finished 5 days of linezolid 600 mg -DuoNeb Q6h PRN Anemia Hemoglobin stable.  S/p 1 unit PRBC on 12/1, now receiving IV iron per Nephro.  Recommend considering outpatient colonoscopy depending on goals of care, though more likely chronic disease.  Low concern for GI bleed.  Is already on Carafate TID. -Getting IV iron infusions -Transfuse <8.0 Hgb  -Pantoprazole 40 mg once daily -CBC every other day COPD (chronic obstructive pulmonary disease) (HCC) Low concern for acute exacerbation.  Patient has now been adherent with BiPAP overnight. -Wean O2 as able with goal O2 88-92%, home baseline 4 L with exertion -DuoNebs every 4 hours PRN -Continue Dulera 2 puffs BID (on Symbicort formulary equivalent) -Continue guaifenesin 600 mg BID -Nightly BiPAP DM2 (diabetes mellitus, type 2) (HCC) A1c 6.0.  Home meds: Lantus 12 units and 2 units TID with meals.  CBG in last 24 hours have been borderline elevated to ~200. -Continue sensitive SSI -Increase to 6 units Semglee daily -Hold home Jardiance 10 mg in setting of AKI -Hold home glipizide 10 mg BID Paroxysmal atrial flutter (HCC)  LV apical thrombus noted on TTE 11/28. -Home Eliquis 5 mg BID -AM BMP, mag; goal K>4, mag>2  Chronic and Stable  Problems: Hypothyroidism: Continue levothyroxine 75 mcg daily Depression: Continue fluoxetine 40 mg daily, decreased trazodone to 25 mg nightly given somnolence Peripheral neuropathy: Continue gabapentin 600 mg nightly HLD: Continue home atorvastatin 40 mg daily Wounds to right leg: Wound care following continue compression wraps bilateral legs GERD: Continue home carafate 1 g TID, descalate to pantoprazole 40 mg ONCE daily  FEN/GI: Renal diet PPx: Heparin TID Dispo: SNF at [facility] pending clinical improvement of creatinine.  Subjective:  This morning, patient states she is feeling well overall.  No new symptoms overnight.  Continues to maintain that she does not like the food in the hospital.  Continues to refuse Ensure even when offered and encouraged.  Ready to leave the hospital when medically cleared.  Objective: Temp:  [98 F (36.7 C)-98.7 F (37.1 C)] 98.4 F (36.9 C) (12/09 0743) Pulse Rate:  [78-89] 78 (12/09 0743) Resp:  [16-20] 20 (12/09 0743) BP: (117-145)/(44-53) 145/50 (12/09 0743) SpO2:  [88 %-99 %] 92 % (12/09 0743) Weight:  [96.2 kg] 96.2 kg (12/09 0428)  Physical Exam: General: Age-appropriate, resting comfortably in chair, NAD, WNWD, alert and at baseline. HEENT: MMM. Cardiovascular: No JVD.  Regular rate and rhythm. Normal S1/S2.  Systolic mechanical valve murmur, no rubs or gallops appreciated. 2+ radial pulses. Pulmonary: Clear bilaterally to ascultation. No increased WOB, no accessory muscle usage on room air. No wheezes, rales, or crackles. Abdominal: Nondistended. No tenderness to deep or light palpation. No rebound or guarding. Extremities: Trace peripheral edema bilaterally.  Bilateral leg wounds with dry dressings, no drainage, minimal edema.  Laboratory: Most recent CBC Lab Results  Component Value Date   WBC 10.2 04/21/2023   HGB 9.3 (L) 04/21/2023   HCT 30.7 (L) 04/21/2023   MCV 95.3 04/21/2023   PLT 347 04/21/2023   Most recent BMP     Latest Ref Rng & Units 04/22/2023    2:55 AM  BMP  Glucose 70 - 99 mg/dL 284   BUN 8 - 23 mg/dL 52   Creatinine 1.32 - 1.00 mg/dL 4.40   Sodium 102 - 725 mmol/L 139   Potassium 3.5 - 5.1 mmol/L 3.3   Chloride 98 - 111 mmol/L 101   CO2 22 - 32 mmol/L 28   Calcium 8.9 - 10.3 mg/dL 8.7     Other pertinent labs: -None  New Imaging/Diagnostic Tests: -None  Jamarion Jumon[place], MD 04/22/2023, 8:04 AM  PGY-1, [facility] FPTS Intern pager: [phone], text pages welcome Secure chat group [facility] Teaching Service

## 2023-04-22 NOTE — Assessment & Plan Note (Addendum)
Euvolemic, no further diuresis at this time.  Net negative 8.5 L since admission, UOP likely inaccurate.  Restarting partial dose maintenance diuretics today, on 100 mg torsemide BID at home. -Daily weights, strict I&Os -PT/OT evaluate and treat -Out of bed order with meals -Restart torsemide 60 mg twice daily, assess for tolerance -Cardiology following, recs appreciated -GDMT: Hydralazine 25 mg TID, Imdur 15 mg daily, carvedilol 3.125 mg BID

## 2023-04-22 NOTE — Assessment & Plan Note (Signed)
LV apical thrombus noted on TTE 11/28. -Home Eliquis 5 mg BID -AM BMP, mag; goal K>4, mag>2

## 2023-04-22 NOTE — Assessment & Plan Note (Addendum)
Creatinine peaked and downtrending gradually, baseline ~1.5-1.7.  UOP inaccurate overnight.  Now progressing towards discharge. -Hold Jardiance and irbesartan for now, restart at discharge or outpatient depending on creatinine -Avoid nephrotoxins -Encourage PO intake, nursing care order for 150 mL to be offered every 4 hours -Seen by RD for poor PO, started Ensure -Appreciate Nephrology recs, who will continue to follow -PM RFP to check electrolytes and phosphorus - K repleted today -Daily RFP per Nephrology

## 2023-04-22 NOTE — Assessment & Plan Note (Signed)
Saturations now remaining >88% on room air, does desaturate when when she drifts off to sleep, suspect patient require oxygen at night for home.  Really requires BiPAP when sleeping. -Finished 5 days of linezolid 600 mg -DuoNeb Q6h PRN

## 2023-04-22 NOTE — Assessment & Plan Note (Signed)
Low concern for acute exacerbation.  Patient has now been adherent with BiPAP overnight. -Wean O2 as able with goal O2 88-92%, home baseline 4 L with exertion -DuoNebs every 4 hours PRN -Continue Dulera 2 puffs BID (on Symbicort formulary equivalent) -Continue guaifenesin 600 mg BID -Nightly BiPAP

## 2023-04-22 NOTE — Progress Notes (Signed)
Tonya Glass Progress Note   Assessment/ Plan:   1. Acute kidney Injury on chronic kidney disease stage IIIB: Suspected to be hemodynamically mediated/cardiorenal in the setting of new acute HFrEF.  ARB and SGLT2 inhibitor currently on hold.  Crt essentially stable today. At home on torsemide 100mg  BID. Will restart 60mg  BID and see how she tolerated. If stable tomorrow may be able to DC. 2.  Hypokalemia: replace prn 3.  Anemia: Likely secondary to chronic illness, without overt loss and with low iron levels when checked on 11/28.  IV iron has been given 4.  History of bioprosthetic aortic valve replacement valve: With recently recognized LV thrombus and currently on apixaban.  Subjective:   Continues to have orthopnea but says this is chronic for years. Otherwise no concerns.   Objective:   BP (!) 145/50 (BP Location: Left Wrist)   Pulse 78   Temp 98.4 F (36.9 C) (Oral)   Resp 20   Ht 5' (1.524 m)   Wt 96.2 kg   SpO2 92%   BMI 41.42 kg/m   Intake/Output Summary (Last 24 hours) at 04/22/2023 1025 Last data filed at 04/22/2023 0834 Gross per 24 hour  Intake 908.95 ml  Output 450 ml  Net 458.95 ml   Weight change: 4.7 kg  Physical Exam: Gen: Comfortably sitting up in bed, nad CVS: normal rate, no rub Resp: mild iwob, bilateral chest rise Abd: Soft, flat, nontender, bowel sounds normal Ext: 1+ bilateral lower extremity edema with venous stasis changes  Imaging: No results found.  Labs: BMET Recent Labs  Lab 04/18/23 0238 04/19/23 0254 04/19/23 1401 04/20/23 0226 04/20/23 1455 04/21/23 0237 04/22/23 0255  NA 140 136 139 140 138 140 139  K 4.9 2.9* 3.6 3.2* 4.7 3.9 3.3*  CL 95* 92* 95* 97* 101 101 101  CO2 30 31 30 29 26 27 28   GLUCOSE 172* 156* 193* 187* 241* 176* 238*  BUN 54* 53* 55* 60* 59* 55* 52*  CREATININE 2.52* 2.66* 2.61* 2.72* 2.72* 2.49* 2.40*  CALCIUM 8.8* 8.9 9.1 8.7* 8.7* 8.9 8.7*  PHOS  --   --  2.6 2.4*  --   --  2.4*    CBC Recent Labs  Lab 04/19/23 0254 04/20/23 0226 04/20/23 1455 04/21/23 0237  WBC 10.5 10.1 10.7* 10.2  HGB 9.4* 8.5* 8.7* 9.3*  HCT 30.3* 27.5* 28.9* 30.7*  MCV 92.9 93.2 95.7 95.3  PLT 323 335 332 347    Medications:     apixaban  5 mg Oral BID   atorvastatin  40 mg Oral Daily   carvedilol  3.125 mg Oral BID WC   feeding supplement  237 mL Oral Q24H   FLUoxetine  40 mg Oral Daily   gabapentin  600 mg Oral QHS   hydrALAZINE  25 mg Oral Q8H   insulin aspart  0-9 Units Subcutaneous TID WC   insulin glargine-yfgn  4 Units Subcutaneous Daily   isosorbide mononitrate  15 mg Oral Daily   levothyroxine  75 mcg Oral Daily   mometasone-formoterol  2 puff Inhalation BID   multivitamin with minerals  1 tablet Oral Daily   pantoprazole  40 mg Oral Daily   sodium chloride flush  3 mL Intravenous Q12H   sucralfate  1 g Oral TID WC & HS   traZODone  25 mg Oral QHS   umeclidinium bromide  1 puff Inhalation Daily   Darnell Level  04/22/2023, 10:25 AM

## 2023-04-22 NOTE — Progress Notes (Signed)
   04/22/23 1949  BiPAP/CPAP/SIPAP  BiPAP/CPAP/SIPAP Pt Type Adult  Reason BIPAP/CPAP not in use Non-compliant

## 2023-04-22 NOTE — Progress Notes (Signed)
Mobility Specialist Progress Note:   04/22/23 1215  Mobility  Activity Transferred from bed to chair  Level of Assistance +2 (takes two people) (ModA)  Assistive Device Stedy  Activity Response Tolerated well  Mobility Referral Yes  Mobility visit 1 Mobility  Mobility Specialist Start Time (ACUTE ONLY) 1102  Mobility Specialist Stop Time (ACUTE ONLY) 1117  Mobility Specialist Time Calculation (min) (ACUTE ONLY) 15 min   Pt received trying to get EOB w/ RN in the room, agreeable to mobility. Pt was able to stand from bed with ModA +2 and from stedy with MinA. No c/o throughout. Left in chair w/ call bell and personal belongings in reach. Chair alarm on. NT in room.   Thompson Grayer Mobility Specialist  Please contact vis Secure Chat or  Rehab Office [phone]

## 2023-04-22 NOTE — Assessment & Plan Note (Addendum)
A1c 6.0.  Home meds: Lantus 12 units and 2 units TID with meals.  CBG in last 24 hours have been borderline elevated to ~200. -Continue sensitive SSI -Increase to 6 units Semglee daily -Hold home Jardiance 10 mg in setting of AKI -Hold home glipizide 10 mg BID

## 2023-04-22 NOTE — Progress Notes (Signed)
Physical Therapy Treatment Patient Details Name: Tonya Glass MRN: [id number] DOB: 1945/12/15 Today's Date: 04/22/2023   History of Present Illness Tonya Glass is 77 y.o. admitted for acute on chronic heart failure exacerbation. Small LV thrombus noted with AKI and anemia. Pt requiring intermittent bipap. S/p R heart cath 12/4. Pertinent PMH/PSH includes HFpEF, HLD, Aortic stenosis s/p valve replacement, Sick sinus syndrome, Hypothyroidism, HTN, CKD, COPD, T2DM.    PT Comments  Pt eager to return to [facility]. Today only one person assist available. Attempted standing from low recliner with Stedy but unable to fully stand. Focused on strengthening of trunk and extremities. Patient will benefit from continued inpatient follow up therapy, <3 hours/day and likely will progress better with scheduled therapies and 2 person assist if available.      If plan is discharge home, recommend the following: Two people to help with walking and/or transfers;Two people to help with bathing/dressing/bathroom;Assistance with cooking/housework;Assist for transportation   Can travel by private vehicle     No  Equipment Recommendations  None recommended by PT    Recommendations for Other Services       Precautions / Restrictions Precautions Precautions: Fall Restrictions Weight Bearing Restrictions: No     Mobility  Bed Mobility               General bed mobility comments: in recliner on entry    Transfers Overall transfer level: Needs assistance Equipment used: Ambulation equipment used Transfers: Sit to/from Stand             General transfer comment: Attempted x 2 to stand from low recliner with Stedy. Pt unable to fully rise with one person assist. Transfer via Lift Equipment: Stedy  Ambulation/Gait                   Stairs             Wheelchair Mobility     Tilt Bed    Modified Rankin (Stroke Patients Only)       Balance Overall balance  assessment: Needs assistance Sitting-balance support: Bilateral upper extremity supported, Feet supported, Single extremity supported Sitting balance-Leahy Scale: Fair                                      Cognition Arousal: Alert Behavior During Therapy: WFL for tasks assessed/performed Overall Cognitive Status: Impaired/Different from baseline Area of Impairment: Memory, Problem solving                     Memory: Decreased short-term memory       Problem Solving: Slow processing, Difficulty sequencing, Requires verbal cues          Exercises General Exercises - Upper Extremity Shoulder Flexion: AROM, Both, 10 reps, Seated Shoulder Horizontal ABduction: AROM, Both, 10 reps, Seated Shoulder Horizontal ADduction: AROM, Both, 10 reps, Seated General Exercises - Lower Extremity Long Arc Quad: AROM, Both, 10 reps, Seated Hip ABduction/ADduction: Strengthening, Both, 10 reps (Isometric against manual resistance)    General Comments        Pertinent Vitals/Pain Pain Assessment Pain Assessment: Faces Faces Pain Scale: Hurts little more Pain Location: B knees and back Pain Descriptors / Indicators: Grimacing, Guarding, Sore, Moaning, Discomfort    Home Living                          Prior Function  PT Goals (current goals can now be found in the care plan section) Progress towards PT goals: Not progressing toward goals - comment    Frequency    Min 1X/week      PT Plan      Co-evaluation              AM-PAC PT "6 Clicks" Mobility   Outcome Measure  Help needed turning from your back to your side while in a flat bed without using bedrails?: A Lot Help needed moving from lying on your back to sitting on the side of a flat bed without using bedrails?: Total Help needed moving to and from a bed to a chair (including a wheelchair)?: Total Help needed standing up from a chair using your arms (e.g., wheelchair or  bedside chair)?: Total Help needed to walk in hospital room?: Total Help needed climbing 3-5 steps with a railing? : Total 6 Click Score: 7    End of Session   Activity Tolerance: Patient limited by fatigue Patient left: in chair;with call bell/phone within reach;with chair alarm set   PT Visit Diagnosis: Other abnormalities of gait and mobility (R26.89);Muscle weakness (generalized) (M62.81);Pain;Unsteadiness on feet (R26.81);Difficulty in walking, not elsewhere classified (R26.2) Pain - Right/Left:  (bilateral) Pain - part of body: Knee     Time: 1222-1237 PT Time Calculation (min) (ACUTE ONLY): 15 min  Charges:      PT General Charges $$ ACUTE PT VISIT: 1 Visit                     [facility] PT Acute Rehabilitation Services Office [phone]    Angelina Ok [facility] 04/22/2023, 2:28 PM

## 2023-04-22 NOTE — Assessment & Plan Note (Deleted)
Seen on echo 11/28. -Monitor fever curve -Continue Eliquis 5 mg twice daily

## 2023-04-22 NOTE — Assessment & Plan Note (Deleted)
Last BM 12/7. -Continue bowel regimen with MiraLAX and senna

## 2023-04-23 DIAGNOSIS — I5043 Acute on chronic combined systolic (congestive) and diastolic (congestive) heart failure: Secondary | ICD-10-CM | POA: Diagnosis not present

## 2023-04-23 LAB — GLUCOSE, CAPILLARY
Glucose-Capillary: 189 mg/dL — ABNORMAL HIGH (ref 70–99)
Glucose-Capillary: 150 mg/dL — ABNORMAL HIGH (ref 70–99)
Glucose-Capillary: 197 mg/dL — ABNORMAL HIGH (ref 70–99)
Glucose-Capillary: 183 mg/dL — ABNORMAL HIGH (ref 70–99)

## 2023-04-23 LAB — RENAL FUNCTION PANEL
Albumin: 3 g/dL — ABNORMAL LOW (ref 3.5–5.0)
Anion gap: 11 (ref 5–15)
BUN: 52 mg/dL — ABNORMAL HIGH (ref 8–23)
CO2: 25 mmol/L (ref 22–32)
Calcium: 9.2 mg/dL (ref 8.9–10.3)
Chloride: 102 mmol/L (ref 98–111)
Creatinine, Ser: 2.25 mg/dL — ABNORMAL HIGH (ref 0.44–1.00)
GFR, Estimated: 22 mL/min — ABNORMAL LOW (ref >60)
Glucose, Bld: 219 mg/dL — ABNORMAL HIGH (ref 70–99)
Phosphorus: 1.9 mg/dL — ABNORMAL LOW (ref 2.5–4.6)
Potassium: 3.8 mmol/L (ref 3.5–5.1)
Sodium: 138 mmol/L (ref 135–145)

## 2023-04-23 LAB — MAGNESIUM: Magnesium: 2.4 mg/dL (ref 1.7–2.4)

## 2023-04-23 MED ORDER — K PHOS MONO-SOD PHOS DI & MONO 155-852-130 MG PO TABS
500.0000 mg | ORAL_TABLET | Freq: Once | ORAL | Status: AC
  Administered 2023-04-23: 500 mg via ORAL
  Filled 2023-04-23: qty 2

## 2023-04-23 MED ORDER — TRAZODONE HCL 50 MG PO TABS: 25.0000 mg | ORAL_TABLET | Freq: Every day | ORAL | Status: DC

## 2023-04-23 MED ORDER — TORSEMIDE 60 MG PO TABS: 60.0000 mg | ORAL_TABLET | Freq: Two times a day (BID) | ORAL | Status: DC

## 2023-04-23 MED ORDER — LANTUS SOLOSTAR 100 UNIT/ML ~~LOC~~ SOPN: 6.0000 [IU] | PEN_INJECTOR | Freq: Every day | SUBCUTANEOUS | 11 refills | Status: DC

## 2023-04-23 MED ORDER — POTASSIUM CHLORIDE 20 MEQ PO PACK
40.0000 meq | PACK | Freq: Once | ORAL | Status: AC
  Administered 2023-04-23: 40 meq via ORAL
  Filled 2023-04-23: qty 2

## 2023-04-23 MED ORDER — HYDRALAZINE HCL 25 MG PO TABS: 25.0000 mg | ORAL_TABLET | Freq: Three times a day (TID) | ORAL | Status: DC

## 2023-04-23 MED ORDER — ISOSORBIDE MONONITRATE ER 30 MG PO TB24: 15.0000 mg | ORAL_TABLET | Freq: Every day | ORAL | Status: DC

## 2023-04-23 NOTE — Progress Notes (Signed)
[facility] Progress Note   Assessment/ Plan:   1. Acute kidney Injury on chronic kidney disease stage IIIB: Suspected to be hemodynamically mediated/cardiorenal in the setting of new acute HFrEF.  ARB and SGLT2 inhibitor currently on hold.  Crt essentially stable today. At home on torsemide 100mg  BID.  Restarted torsemide 60 mg twice daily.  Consider uptitrating in the outpatient setting if needed.  Given her stable creatinine we will sign off at this time.  We will arrange follow-up in our office. 2.  Hypokalemia: replace prn.  Overall improved 3.  Anemia: Likely secondary to chronic illness, without overt loss and with low iron levels when checked on 11/28.  IV iron has been given 4.  History of bioprosthetic aortic valve replacement valve: With recently recognized LV thrombus and currently on apixaban. 5.  Hypophosphatemia: Phosphorus somewhat low.  Possibly related to decreased p.o. intake.  Likely will improve in the outpatient setting with improved appetite.  Would not put her on standing replacement at this time.  Subjective:   Patient feels great today and hoping to go home.  No complaints   Objective:   BP (!) 144/76 (BP Location: Left Wrist)   Pulse 78   Temp (!) 97.5 F (36.4 C) (Oral)   Resp 16   Ht 5' (1.524 m)   Wt 96.2 kg   SpO2 96%   BMI 41.42 kg/m   Intake/Output Summary (Last 24 hours) at 04/23/2023 0946 Last data filed at 04/23/2023 0934 Gross per 24 hour  Intake 480 ml  Output 450 ml  Net 30 ml   Weight change:   Physical Exam: Gen: Comfortably sitting up in bed, nad CVS: normal rate, no rub Resp: mild iwob, bilateral chest rise Abd: Soft, flat, nontender, bowel sounds normal Ext: 1+ bilateral lower extremity edema with venous stasis changes  Imaging: No results found.  Labs: BMET Recent Labs  Lab 04/19/23 0254 04/19/23 1401 04/20/23 0226 04/20/23 1455 04/21/23 0237 04/22/23 0255 04/23/23 0248  NA 136 139 140 138 140 139 138   K 2.9* 3.6 3.2* 4.7 3.9 3.3* 3.8  CL 92* 95* 97* 101 101 101 102  CO2 31 30 29 26 27 28 25   GLUCOSE 156* 193* 187* 241* 176* 238* 219*  BUN 53* 55* 60* 59* 55* 52* 52*  CREATININE 2.66* 2.61* 2.72* 2.72* 2.49* 2.40* 2.25*  CALCIUM 8.9 9.1 8.7* 8.7* 8.9 8.7* 9.2  PHOS  --  2.6 2.4*  --   --  2.4* 1.9*   CBC Recent Labs  Lab 04/19/23 0254 04/20/23 0226 04/20/23 1455 04/21/23 0237  WBC 10.5 10.1 10.7* 10.2  HGB 9.4* 8.5* 8.7* 9.3*  HCT 30.3* 27.5* 28.9* 30.7*  MCV 92.9 93.2 95.7 95.3  PLT 323 335 332 347    Medications:     apixaban  5 mg Oral BID   atorvastatin  40 mg Oral Daily   carvedilol  3.125 mg Oral BID WC   feeding supplement  237 mL Oral Q24H   FLUoxetine  40 mg Oral Daily   gabapentin  600 mg Oral QHS   hydrALAZINE  25 mg Oral Q8H   insulin aspart  0-9 Units Subcutaneous TID WC   insulin glargine-yfgn  6 Units Subcutaneous Daily   isosorbide mononitrate  15 mg Oral Daily   levothyroxine  75 mcg Oral Daily   mometasone-formoterol  2 puff Inhalation BID   multivitamin with minerals  1 tablet Oral Daily   pantoprazole  40 mg Oral Daily  sodium chloride flush  3 mL Intravenous Q12H   sucralfate  1 g Oral TID WC & HS   torsemide  60 mg Oral BID   traZODone  25 mg Oral QHS   umeclidinium bromide  1 puff Inhalation Daily   Darnell Level  04/23/2023, 9:46 AM

## 2023-04-23 NOTE — Progress Notes (Signed)
Nutrition Follow-up  DOCUMENTATION CODES:  Obesity unspecified  INTERVENTION:  Liberalize diet from renal to regular to promote nutritional adequacy Encouraged adequate protein intake; provided examples of ways to incorporate more of this food group when consuming little meat in diet MVI with minerals daily  NUTRITION DIAGNOSIS:  Inadequate oral intake related to decreased appetite as evidenced by per patient/family report. - remains applicable  GOAL:  Patient will meet greater than or equal to 90% of their needs - goal unmet  MONITOR: PO intake, Labs, Weight trends, Skin  REASON FOR ASSESSMENT:  Consult Poor PO  ASSESSMENT:  Pt admitted with c/o dyspnea d/t acute CHF exacerbation. PMH significant for HFpEF, HLD, AS s/p valve replacement, SSS, hypothyroidism, HTN, CKD, COPD, T2DM.  Admission c/b AKI on CKD IIIb which is suspected to be related to hemodynamics/cardiorenal. Cr stable. Noted pt with hypophosphatemia continuing to trend down. Discussed with Nephrology. Question whether this is related to refeeding syndrome given poor PO intake throughout admission. Pt planned for discharge today, nephrology plans to hold on repletion as PO intake likely to change and hopefully increase once discharged.   Pt resting in bedside recliner at time of visit. She awoke easily with shoulder tap. Pt eating as best as able despite dislike for hospital food. She reports that she enjoys the food at her SNF and is looking forward to discharging. Discussed ways with pt to increase protein intake as she does not like nutrition supplements and rarely eats meat. Of note, beans, yogurt, nuts and chocolate are all phosphorus containing foods which  may help with low lab levels.   Meal completions: 12/7: 10% breakfast, 20% lunch 12/8: 50% breakfast, 85% lunch 12/9: 50% breakfast, 50% lunch, 50% dinner 12/10: 10% breakfast  Admit weight: 109.8 kg Current weight: 96.2 kg + non-pitting BUE; moderate  pitting BLE  Medications: SSI 0-9 units TID, semglee 6 units daily, MVI, protonix, carafate, torsemide  Labs: BUN 52, Cr 2.25, Phos 1.9 (down from 2.4), GFR 22, CBG's 150-197 x24 hours  Diet Order:   Diet Order             Diet regular Room service appropriate? Yes with Assist; Fluid consistency: Thin; Fluid restriction: 1800 mL Fluid  Diet effective now                   EDUCATION NEEDS:  Education needs have been addressed  Skin:  Skin Assessment: Skin Integrity Issues: Skin Integrity Issues:: Stage II Stage II: sacrum  Last BM:  12/10 type 6 smear  Height:  Ht Readings from Last 1 Encounters:  04/10/23 5' (1.524 m)    Weight:  Wt Readings from Last 1 Encounters:  04/22/23 96.2 kg    Ideal Body Weight:  45.5 kg  BMI:  Body mass index is 41.42 kg/m.  Estimated Nutritional Needs:  Kcal:  1300-1500 Protein:  65-80g Fluid:  >/=1.5L  Drusilla Kanner, RDN, LDN Clinical Nutrition

## 2023-04-23 NOTE — Discharge Summary (Incomplete)
Family Medicine Teaching [facility] Discharge Summary  Patient name: Tonya Glass Medical record number: [id number] Date of birth: [birth year] Age: 77 y.o. Gender: female Date of Admission: 04/10/2023  Date of Discharge: 04/24/2023 Admitting Physician: Nelia Shi, MD  Primary Care Provider: No primary care provider on file. Consultants: Cardiology, Nephrology  Indication for Hospitalization: Heart failure exacerbation  Discharge Diagnoses/Problem List:  Principal Problem for Admission: Heart failure exacerbation Other Problems addressed during stay:  Principal Problem:   Acute exacerbation of CHF (congestive heart failure) (HCC) Active Problems:   S/P aortic valve replacement with bioprosthetic valve   DM2 (diabetes mellitus, type 2) (HCC)   Acute on chronic hypoxic respiratory failure (HCC)   Paroxysmal atrial flutter (HCC)   Acute kidney injury 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)   Anemia   COPD (chronic obstructive pulmonary disease) (HCC)   GERD (gastroesophageal reflux disease)   Acute systolic heart failure (HCC)   Pulmonary hypertension, unspecified (HCC)   Hospital-acquired pneumonia   SOB (shortness of breath)   Brief Hospital Course:  Tonya Glass is a 78 y.o. year old with a history of HFpEF, HTN, sick sinus syndrome status post ICD, aortic stenosis status post valve replacement, CKD 3b, hypothyroidism, COPD, and T2DM who presented with acute hypoxic respiratory failure and was admitted to the [facility] Medicine Teaching service for acute on chronic HF exacerbation.  Acute on chronic CHF exacerbation Presented with dyspnea and weight gain thought to be in setting of acute CHF exacerbation with diffuse fluffy opacities and lung markings on CXR.  TTE showing EF 35-40%.  Diuresed with Lasix drip per cardiology.  Patient did start retaining CO2 as seen on VBG and was placed on BIPAP with improvement.  Creatinine rose sharply in the setting of  overdiuresis and Lasix drip was stopped.  Cardiology performed right heart cath, which showed normal pressures and further diuresis was held.  Restarted on partial dose torsemide 60 mg twice daily with slight creatinine rise, but cleared to discharge with 1 week outpatient BMP per Nephrology.  AKI in setting of CKD3b Suspected in the setting of cardiorenal syndrome initially.  Improved with continued diuresis.  Then, creatinine rose of overdiuresis, and Lasix drip was stopped.  Farxiga and olmesartan were also held.  FENa supported prerenal etiology of AKI.  Nephrology was consulted and did not recommend additional intervention.  Renal ultrasound showed no hydronephrosis and no other pathology.  No urinary retention on bladder scans.  Creatinine gradually trended down towards baseline with PO hydration leading up to discharge, but continued holding Farxiga and olmesartan pending complete resolution of AKI outpatient.  LV Thrombus Found on TTE.  Placed on heparin drip.  Blood cultures x2 showed no growth.  Heparin drip discontinued prior to discharge. Patient was continued on their eliquis.  Anemia Thought to be due to chronic disease and iron deficiency. Hemoglobin remained stablely low between 7-9 while admitted, nephrology ordered ESAs.  Other chronic conditions were medically managed with home medications and formulary alternatives as necessary (COPD, T2DM, GERD, A flutter, hypothyroidism, depression, peripheral neuropathy, osteoporosis, HLD, HTN).  PCP Follow-up Recommendations: Look into sleep study for possible OSA/OHS, Cardiology recommended outpatient Consider GI follow up for potential colonoscopy 2/2 anemia, could also be chronic and depends on goals of care with patient Reduced torsemide to 60 mg BID in setting of AKI, please recheck BMP and increase back to 100 mg BID dose if appropriate Farxiga and olmesartan held at discharge in setting of AKI, restart if Creatinine  trending  down Stopped glipizide due to concerns for hypoglycemia in setting of worsened renal function  Disposition: SNF at [facility]  Discharge Condition: Stable, euvolemic, creatinine trending down  Discharge Exam:  Vitals:   04/24/23 1100 04/24/23 1110  BP: (!) 130/52 (!) 117/32  Pulse: 71   Resp: 18 18  Temp: 97.6 F (36.4 C) 98 F (36.7 C)  SpO2: 93%    Physical Exam: General: Elderly female, comfortably resting in chair at bedside.  Forgetful, alert and oriented to self, situation, but not place. Eyes: No scleral icterus or mucosal pallor. ENTM: MMM. Cardiovascular: RRR.  Normal S1/S2.  3/6 systolic prosthetic valve murmur, no rubs or gallops.  No JVD. Respiratory: Normal work of breathing on room air.  CTAB. Gastrointestinal: Tenderness to palpation all quadrants. MSK: Moving all extremities appropriately. Extremities: 2+ pitting peripheral edema bilaterally, overlying venous stasis skin changes.  Capillary refill 2 seconds. Derm: No rashes grossly.  Bilateral lower extremity wounds with dry dressings, no drainage, no erythema, mildly tender to palpation. Neuro: No focal neurological deficit. Psych: Full range affect, pleasant, appropriate.  Conversational.  Forgetful.  Significant Procedures: RHC 12/4 with normal pressures.  Significant Labs and Imaging:  No results for input(s): "WBC", "HGB", "HCT", "PLT" in the last 48 hours. Recent Labs  Lab 04/23/23 0248 04/24/23 0706  NA 138 139  K 3.8 4.9  CL 102 105  CO2 25 21*  GLUCOSE 219* 224*  BUN 52* 56*  CREATININE 2.25* 2.52*  CALCIUM 9.2 9.5  MG 2.4  --   PHOS 1.9*  --   ALBUMIN 3.0*  --     -TTE: EF 35-40%. -Admission CXR: Diffuse fluffy opacities and lung markings.  Increased bibasilar opacification, atelectasis versus infiltrate.  Sharp costophrenic angles. -Renal US: No hydronephrosis.  Results/Tests Pending at Time of Discharge: None  Discharge Medications:  Allergies as of 04/24/2023   No Known  Allergies      Medication List     STOP taking these medications    cloNIDine 0.2 MG tablet Commonly known as: CATAPRES   doxycycline 100 MG tablet Commonly known as: VIBRA-TABS   glipiZIDE 10 MG tablet Commonly known as: GLUCOTROL   Jardiance 10 MG Tabs tablet Generic drug: empagliflozin   olmesartan 20 MG tablet Commonly known as: BENICAR   traMADol 50 MG tablet Commonly known as: ULTRAM       TAKE these medications    acetaminophen 650 MG CR tablet Commonly known as: TYLENOL Take 650 mg by mouth every 8 (eight) hours as needed (Discomfort).   alendronate 70 MG tablet Commonly known as: FOSAMAX Take 70 mg by mouth every Sunday.   Aquaphor Adv Therapy Healing 41 % Oint Apply 1 Application topically See admin instructions. Apply 1 application topically every shift, apply to sacrum wound.   atorvastatin 40 MG tablet Commonly known as: LIPITOR Take 40 mg by mouth daily.   Benzethonium Chloride 0.13 % Liqd Apply 1 Application topically 2 (two) times a week. On Tuesdays and Fridays   budesonide-formoterol 160-4.5 MCG/ACT inhaler Commonly known as: SYMBICORT Inhale 2 puffs into the lungs 2 (two) times daily.   carvedilol 3.125 MG tablet Commonly known as: COREG Take 1 tablet (3.125 mg total) by mouth 2 (two) times daily.   Eliquis 5 MG Tabs tablet Generic drug: apixaban Take 5 mg by mouth 2 (two) times daily.   FLUoxetine 40 MG capsule Commonly known as: PROZAC Take 40 mg by mouth daily.   gabapentin 300 MG capsule Commonly known  as: NEURONTIN Take 600 mg by mouth at bedtime.   guaiFENesin 600 MG 12 hr tablet Commonly known as: MUCINEX Take 600 mg by mouth 2 (two) times daily.   HumaLOG KwikPen 100 UNIT/ML KwikPen Generic drug: insulin lispro Inject into the skin 2 (two) times daily. Sliding scale   hydrALAZINE 25 MG tablet Commonly known as: APRESOLINE Take 1 tablet (25 mg total) by mouth every 8 (eight) hours.   ipratropium 0.06 % nasal  spray Commonly known as: ATROVENT Place 2 sprays into both nostrils 3 (three) times daily. What changed:  when to take this reasons to take this   Iron 325 (65 Fe) MG Tabs Take 325 mg by mouth daily.   isosorbide mononitrate 30 MG 24 hr tablet Commonly known as: IMDUR Take 0.5 tablets (15 mg total) by mouth daily.   Lantus SoloStar 100 UNIT/ML Solostar Pen Generic drug: insulin glargine Inject 6 Units into the skin at bedtime. What changed: how much to take   levocetirizine 5 MG tablet Commonly known as: XYZAL Take 5 mg by mouth at bedtime.   levothyroxine 75 MCG tablet Commonly known as: SYNTHROID Take 75 mcg by mouth daily.   magnesium oxide 400 (240 Mg) MG tablet Commonly known as: MAG-OX Take 400 mg by mouth daily.   nystatin ointment Commonly known as: MYCOSTATIN Apply 1 application  topically daily as needed (rash). 100,000- units/gram   pantoprazole 40 MG tablet Commonly known as: PROTONIX Take 40 mg by mouth 2 (two) times daily.   ProAir RespiClick 108 (90 Base) MCG/ACT Aepb Generic drug: Albuterol Sulfate Inhale 2 puffs into the lungs 4 (four) times daily as needed.   sodium chloride 0.65 % Soln nasal spray Commonly known as: OCEAN Place 2 sprays into both nostrils every 6 (six) hours as needed for congestion.   sucralfate 1 g tablet Commonly known as: CARAFATE Take 1 g by mouth 4 (four) times daily -  with meals and at bedtime.   Torsemide 60 MG Tabs Take 60 mg by mouth 2 (two) times daily. What changed:  medication strength how much to take   traZODone 50 MG tablet Commonly known as: DESYREL Take 0.5 tablets (25 mg total) by mouth at bedtime. What changed: how much to take        Discharge Instructions: Please refer to Patient Instructions section of EMR for full details.  Patient was counseled important signs and symptoms that should prompt return to medical care, changes in medications, dietary instructions, activity restrictions, and  follow up appointments.   Follow-Up Appointments: Future Appointments  Date Time Provider Department Center  05/03/2023 10:55 AM Joylene Grapes, NP [facility] None  06/21/2023  7:00 AM [facility] DEVICE REMOTES [facility]  08/01/2023 10:00 AM Croitoru, Rachelle Hora, MD [facility] None  09/20/2023  7:00 AM [facility] DEVICE REMOTES [facility]    Phynix Horton, MD 04/24/2023, 12:10 PM PGY-1, [facility]  Upper Level Addendum: I have seen and evaluated this patient along with Dr. Sharion Dove and reviewed the above note, making necessary revisions as appropriate. I agree with the medical decision making and physical exam as noted above. Bess Kinds, MD PGY-2 [facility] Family Medicine Residency

## 2023-04-23 NOTE — Progress Notes (Signed)
Mobility Specialist Progress Note:   04/23/23 1117  Mobility  Activity Transferred to/from [facility]  Level of Assistance +2 (takes two people) (ModA)  Assistive Device Stedy  Activity Response Tolerated well  Mobility Referral Yes  Mobility visit 1 Mobility  Mobility Specialist Start Time (ACUTE ONLY) [id number]  Mobility Specialist Stop Time (ACUTE ONLY) [id number]  Mobility Specialist Time Calculation (min) (ACUTE ONLY) 13 min   Pt received in [facility], NT requesting assistance getting pt to the chair. Pt needed ModA +2 to stand and transfer to the chair. Pt c/o mild pain on the bottom of both feet, otherwise no c/o. Left in chair w/ call bell and personal belongings in reach. Chair alarm on. All needs met. NT in room.   Thompson Grayer Mobility Specialist  Please contact vis Secure Chat or  Rehab Office [phone]

## 2023-04-23 NOTE — Assessment & Plan Note (Signed)
Euvolemic, no further diuresis at this time.  Net negative 8.5 L since admission, UOP likely inaccurate.  Stable on partial dose 60 mg torsemide BID, on 100 mg torsemide BID at home, will titrate up outpatient based on creatinine. -Daily weights, strict I&Os -PT/OT evaluate and treat -Out of bed order with meals -Restart torsemide 60 mg twice daily, assess for tolerance -Cardiology following, recs appreciated -GDMT: Hydralazine 25 mg TID, Imdur 15 mg daily, carvedilol 3.125 mg BID

## 2023-04-23 NOTE — Progress Notes (Signed)
Occupational Therapy Treatment Patient Details Name: Tonya Glass MRN: [id number] DOB: 24-Nov-1945 Today's Date: 04/23/2023   History of present illness Tonya Glass is 77 y.o. admitted for acute on chronic heart failure exacerbation. Small LV thrombus noted with AKI and anemia. Pt requiring intermittent bipap. S/p R heart cath 12/4. Pertinent PMH/PSH includes HFpEF, HLD, Aortic stenosis s/p valve replacement, Sick sinus syndrome, Hypothyroidism, HTN, CKD, COPD, T2DM.   OT comments  Session focused on UE HEP education using theraband to maximize UB strength for ADLs/transfers. Pt abel to return demo 4/4 UE exercises with cues; handout provided to maximize carryover.       If plan is discharge home, recommend the following:  Two people to help with walking and/or transfers;Two people to help with bathing/dressing/bathroom;Assist for transportation;A lot of help with walking and/or transfers   Equipment Recommendations  Other (comment) (TBD)    Recommendations for Other Services      Precautions / Restrictions Precautions Precautions: Fall Restrictions Weight Bearing Restrictions: No       Mobility Bed Mobility               General bed mobility comments: in recliner on entry    Transfers                         Balance Overall balance assessment: Needs assistance Sitting-balance support: Feet supported, No upper extremity supported Sitting balance-Leahy Scale: Fair                                     ADL either performed or assessed with clinical judgement   ADL                                              Extremity/Trunk Assessment Upper Extremity Assessment Upper Extremity Assessment: Generalized weakness;Right hand dominant   Lower Extremity Assessment Lower Extremity Assessment: Defer to PT evaluation        Vision   Vision Assessment?: No apparent visual deficits   Perception     Praxis       Cognition Arousal: Alert Behavior During Therapy: WFL for tasks assessed/performed Overall Cognitive Status: Impaired/Different from baseline Area of Impairment: Memory, Problem solving                     Memory: Decreased short-term memory       Problem Solving: Slow processing, Difficulty sequencing, Requires verbal cues General Comments: some continued minor confusion with memory deficits and decreased overall awareness. still pleasant, following commands but requires multimodal cues and repetition        Exercises Exercises: General Upper Extremity, General Lower Extremity, Other exercises General Exercises - Upper Extremity Shoulder Flexion: Strengthening, Both, 10 reps, Seated, Theraband Theraband Level (Shoulder Flexion): Level 1 (Yellow) Shoulder Horizontal ABduction: Strengthening, Both, 10 reps, Theraband Theraband Level (Shoulder Horizontal Abduction): Level 1 (Yellow) Elbow Flexion: Strengthening, Both, 10 reps, Seated, Theraband Theraband Level (Elbow Flexion): Level 1 (Yellow) Elbow Extension: Strengthening, Both, 10 reps, Theraband, Seated Theraband Level (Elbow Extension): Level 1 (Yellow)    Shoulder Instructions       General Comments      Pertinent Vitals/ Pain       Pain Assessment Pain Assessment: Faces Faces Pain Scale: Hurts a little bit  Pain Location: B knees to feet Pain Descriptors / Indicators: Grimacing, Guarding, Sore, Discomfort Pain Intervention(s): Monitored during session  Home Living                                          Prior Functioning/Environment              Frequency  Min 1X/week        Progress Toward Goals  OT Goals(current goals can now be found in the care plan section)  Progress towards OT goals: Progressing toward goals  Acute Rehab OT Goals Patient Stated Goal: be able to stand and transfer OT Goal Formulation: With patient Time For Goal Achievement: 04/26/23 Potential to  Achieve Goals: Good ADL Goals Pt Will Perform Lower Body Bathing: with mod assist;sit to/from stand;with adaptive equipment Pt Will Perform Lower Body Dressing: with mod assist;sit to/from stand;with adaptive equipment Pt Will Transfer to Toilet: with mod assist;stand pivot transfer;bedside commode Pt Will Perform Toileting - Clothing Manipulation and hygiene: with mod assist;sit to/from stand Pt/caregiver will Perform Home Exercise Program: Increased strength;Both right and left upper extremity;With written HEP provided;With Supervision;With theraband Additional ADL Goal #1: Pt will transfer supine to sit with mod assist in preparation for selfcare tasks.  Plan      Co-evaluation                 AM-PAC OT "6 Clicks" Daily Activity     Outcome Measure   Help from another person eating meals?: None Help from another person taking care of personal grooming?: A Little Help from another person toileting, which includes using toliet, bedpan, or urinal?: Total Help from another person bathing (including washing, rinsing, drying)?: Total Help from another person to put on and taking off regular upper body clothing?: A Little Help from another person to put on and taking off regular lower body clothing?: Total 6 Click Score: 13    End of Session    OT Visit Diagnosis: Unsteadiness on feet (R26.81);Other abnormalities of gait and mobility (R26.89);Pain Pain - Right/Left: Right Pain - part of body: Leg   Activity Tolerance Patient tolerated treatment well   Patient Left in chair;with call bell/phone within reach   Nurse Communication          Time: 8295-6213 OT Time Calculation (min): 16 min  Charges: OT General Charges $OT Visit: 1 Visit OT Treatments $Therapeutic Exercise: 8-22 mins  Bradd Canary, OTR/L Acute Rehab Services Office: [phone]   Lorre Munroe 04/23/2023, 10:09 AM

## 2023-04-23 NOTE — Assessment & Plan Note (Signed)
LV apical thrombus noted on TTE 11/28. -Home Eliquis 5 mg BID -AM BMP, mag; goal K>4, mag>2

## 2023-04-23 NOTE — Assessment & Plan Note (Signed)
Low concern for acute exacerbation.  Patient has now been adherent with BiPAP overnight. -Wean O2 as able with goal O2 88-92%, home baseline 4 L with exertion -DuoNebs every 4 hours PRN -Continue Dulera 2 puffs BID (on Symbicort formulary equivalent) -Continue guaifenesin 600 mg BID -Nightly BiPAP

## 2023-04-23 NOTE — TOC Progression Note (Signed)
Transition of Care [facility]) - Progression Note    Patient Details  Name: Phynix Wachowiak MRN: [id number] Date of Birth: 11-01-1945  Transition of Care [facility]) CM/SW Contact  Michaela Corner, [place] Phone Number: 04/23/2023, 8:33 AM  Clinical Narrative:   CSW submitted for ins auth per MD. Berkley Harvey ID: [id number].  CSW called [employer] to confirm pt can return today once Berkley Harvey is obtained. Liaison stated that pt is able to return.       Barriers to Discharge: Continued Medical Work up  Expected Discharge Plan and Services       Living arrangements for the past 2 months: Assisted Living Facility                                       Social Determinants of Health (SDOH) Interventions SDOH Screenings   Food Insecurity: No Food Insecurity (04/11/2023)  Housing: Low Risk  (04/11/2023)  Transportation Needs: No Transportation Needs (04/11/2023)  Utilities: Not At Risk (04/11/2023)  Social Connections: Unknown (09/25/2021)   Received from [facility], [facility]  Tobacco Use: Medium Risk (04/10/2023)    Readmission Risk Interventions     No data to display

## 2023-04-23 NOTE — Assessment & Plan Note (Signed)
Hemoglobin stable.  S/p 1 unit PRBC on 12/1, now receiving IV iron per Nephro.  Can considering outpatient colonoscopy depending on goals of care, though more likely chronic disease.  Low concern for GI bleed.  Is already on Carafate TID. -Getting IV iron infusions per nephro -Transfuse <8.0 Hgb  -Pantoprazole 40 mg once daily -CBC every other day

## 2023-04-23 NOTE — Assessment & Plan Note (Signed)
Saturations now remaining >88% on room air, does desaturate when when she drifts off to sleep, suspect patient require oxygen at night for home.  Really requires BiPAP when sleeping. -Finished 5 days of linezolid 600 mg -DuoNeb Q6h PRN

## 2023-04-23 NOTE — Assessment & Plan Note (Signed)
Creatinine peaked and downtrending gradually, baseline ~1.5-1.7.  UOP inaccurate overnight.  Stable for discharge. -Will continue to hold Jardiance and irbesartan at discharge, restart outpatient -Avoid nephrotoxins -Encourage PO intake, nursing care order for 150 mL to be offered every 4 hours -Seen by RD for poor PO, started Ensure -Appreciate Nephrology recs, who will continue to follow -PM RFP to check electrolytes and phosphorus - K repleted today -Daily RFP per Nephrology

## 2023-04-23 NOTE — Plan of Care (Signed)
  Problem: Education: Goal: Knowledge of General Education information will improve Description: Including pain rating scale, medication(s)/side effects and non-pharmacologic comfort measures Outcome: Progressing   Problem: Health Behavior/Discharge Planning: Goal: Ability to manage health-related needs will improve Outcome: Progressing   Problem: Clinical Measurements: Goal: Will remain free from infection Outcome: Progressing   

## 2023-04-23 NOTE — Assessment & Plan Note (Signed)
A1c 6.0.  Home meds: Lantus 12 units and 2 units TID with meals.  CBG in last 24 hours have been borderline elevated to ~200. -Continue sensitive SSI -Increase to 6 units Semglee daily -Hold home Jardiance 10 mg in setting of AKI -Hold home glipizide 10 mg BID

## 2023-04-23 NOTE — Progress Notes (Signed)
Daily Progress Note Intern Pager: [phone]  Patient name: Tonya Glass Medical record number: [id number] Date of birth: 19-Oct-1945 Age: 77 y.o. Gender: female  Primary Care Provider: No primary care provider on file. Consultants: Cardiology, Nephrology  Code Status: DNR/DNI   Pt Overview and Major Events to Date:  11/27 - Admitted 12/1 - Hemoglobin <8, transfused 1 unit PRBC, Cr trending up 12/2 - Lasix gtt stopped, no further diuresis 12/4 - RHC euvolemic 12/5 - Nephrology consult for persistently elevated creatinine 12/9 - Creatinine downtrending, restarting torsemide for discharge home   Assessment and Plan: Tonya Glass is a 77 y.o. female with a pertinent PMH of HFpEF, HLD, aortic stenosis s/p valve replacement, sick sinus syndrome, hypothyroidism, HTN, CKD stage IV, T2DM, and COPD on home oxygen 4L with exertion who presented with dyspnea and was admitted for CHF exacerbation, which was complicated by AKI that is now resolving.  Patient is stable for discharge to SNF at [facility], awaiting insurance authorization hopefully later today. Assessment & Plan Acute exacerbation of CHF (congestive heart failure) (HCC) Euvolemic, no further diuresis at this time.  Net negative 8.5 L since admission, UOP likely inaccurate.  Stable on partial dose 60 mg torsemide BID, on 100 mg torsemide BID at home, will titrate up outpatient based on creatinine. -Daily weights, strict I&Os -PT/OT evaluate and treat -Out of bed order with meals -Restart torsemide 60 mg twice daily, assess for tolerance -Cardiology following, recs appreciated -GDMT: Hydralazine 25 mg TID, Imdur 15 mg daily, carvedilol 3.125 mg BID Acute kidney injury 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) Creatinine peaked and downtrending gradually, baseline ~1.5-1.7.  UOP inaccurate overnight.  Stable for discharge. -Will continue to hold Jardiance and irbesartan at discharge, restart outpatient -Avoid  nephrotoxins -Encourage PO intake, nursing care order for 150 mL to be offered every 4 hours -Seen by RD for poor PO, started Ensure -Appreciate Nephrology recs, who will continue to follow -PM RFP to check electrolytes and phosphorus - K repleted today -Daily RFP per Nephrology Acute on chronic hypoxic respiratory failure (HCC) Saturations now remaining >88% on room air, does desaturate when when she drifts off to sleep, suspect patient require oxygen at night for home.  Really requires BiPAP when sleeping. -Finished 5 days of linezolid 600 mg -DuoNeb Q6h PRN Anemia Hemoglobin stable.  S/p 1 unit PRBC on 12/1, now receiving IV iron per Nephro.  Can considering outpatient colonoscopy depending on goals of care, though more likely chronic disease.  Low concern for GI bleed.  Is already on Carafate TID. -Getting IV iron infusions per nephro -Transfuse <8.0 Hgb  -Pantoprazole 40 mg once daily -CBC every other day COPD (chronic obstructive pulmonary disease) (HCC) Low concern for acute exacerbation.  Patient has now been adherent with BiPAP overnight. -Wean O2 as able with goal O2 88-92%, home baseline 4 L with exertion -DuoNebs every 4 hours PRN -Continue Dulera 2 puffs BID (on Symbicort formulary equivalent) -Continue guaifenesin 600 mg BID -Nightly BiPAP DM2 (diabetes mellitus, type 2) (HCC) A1c 6.0.  Home meds: Lantus 12 units and 2 units TID with meals.  CBG in last 24 hours have been borderline elevated to ~200. -Continue sensitive SSI -Increase to 6 units Semglee daily -Hold home Jardiance 10 mg in setting of AKI -Hold home glipizide 10 mg BID Paroxysmal atrial flutter (HCC) LV apical thrombus noted on TTE 11/28. -Home Eliquis 5 mg BID -AM BMP, mag; goal K>4, mag>2  Chronic and Stable Problems: Hypothyroidism: Continue levothyroxine  75 mcg daily Depression: Continue fluoxetine 40 mg daily, decreased trazodone to 25 mg nightly given somnolence Peripheral neuropathy: Continue  gabapentin 600 mg nightly HLD: Continue home atorvastatin 40 mg daily Wounds to right leg: Wound care following continue compression wraps bilateral legs GERD: Continue home carafate 1 g TID, descalate to pantoprazole 40 mg ONCE daily   FEN/GI: Renal diet PPx: Heparin TID Dispo: SNF at [employer] authorization.   Subjective:  This morning, patient is comfortable and ready for discharge, excited to go back to [facility] facility.  Denies chest pain, shortness of breath.  Slept sitting up in recliner next to bed due to discomfort in her knees, does have some increased bilateral lower extremity swelling after this.  Objective: Temp:  [97.3 F (36.3 C)-97.8 F (36.6 C)] 97.8 F (36.6 C) (12/10 1106) Pulse Rate:  [69-85] 69 (12/10 1106) Resp:  [16-20] 18 (12/10 1106) BP: (109-144)/(41-78) 138/69 (12/10 1106) SpO2:  [90 %-96 %] 90 % (12/10 1106)  Physical Exam: General: Elderly female, comfortably resting in chair at bedside.  Forgetful, alert and oriented to self, situation, but not place. Eyes: No scleral icterus or mucosal pallor. ENTM: MMM. Cardiovascular: RRR.  Normal S1/S2.  3/6 systolic prosthetic valve murmur, no rubs or gallops.  No JVD. Respiratory: Normal work of breathing on room air.  CTAB. Gastrointestinal: Tenderness to palpation all quadrants. MSK: Moving all extremities appropriately. Extremities: 2+ pitting peripheral edema bilaterally, overlying venous stasis skin changes.  Capillary refill 2 seconds. Derm: No rashes grossly.  Bilateral lower extremity wounds with dry dressings, no drainage, no erythema, mildly tender to palpation. Neuro: No focal neurological deficit. Psych: Full range affect, pleasant, appropriate.  Conversational.  Forgetful.  Laboratory: Most recent CBC Lab Results  Component Value Date   WBC 10.2 04/21/2023   HGB 9.3 (L) 04/21/2023   HCT 30.7 (L) 04/21/2023   MCV 95.3 04/21/2023   PLT 347 04/21/2023   Most recent  BMP    Latest Ref Rng & Units 04/23/2023    2:48 AM  BMP  Glucose 70 - 99 mg/dL 161   BUN 8 - 23 mg/dL 52   Creatinine 0.96 - 1.00 mg/dL 0.45   Sodium 409 - 811 mmol/L 138   Potassium 3.5 - 5.1 mmol/L 3.8   Chloride 98 - 111 mmol/L 102   CO2 22 - 32 mmol/L 25   Calcium 8.9 - 10.3 mg/dL 9.2     Other pertinent labs: -None  New Imaging/Diagnostic Tests: -None  Tonya Warmoth, MD 04/23/2023, 1:48 PM  PGY-1, [facility] FPTS Intern pager: [phone], text pages welcome Secure chat group [facility] Teaching Service

## 2023-04-24 ENCOUNTER — Ambulatory Visit (INDEPENDENT_AMBULATORY_CARE_PROVIDER_SITE_OTHER): Payer: 59

## 2023-04-24 DIAGNOSIS — I495 Sick sinus syndrome: Secondary | ICD-10-CM

## 2023-04-24 DIAGNOSIS — I5043 Acute on chronic combined systolic (congestive) and diastolic (congestive) heart failure: Secondary | ICD-10-CM | POA: Diagnosis not present

## 2023-04-24 LAB — GLUCOSE, CAPILLARY
Glucose-Capillary: 192 mg/dL — ABNORMAL HIGH (ref 70–99)
Glucose-Capillary: 193 mg/dL — ABNORMAL HIGH (ref 70–99)

## 2023-04-24 LAB — BASIC METABOLIC PANEL
Anion gap: 13 (ref 5–15)
BUN: 56 mg/dL — ABNORMAL HIGH (ref 8–23)
CO2: 21 mmol/L — ABNORMAL LOW (ref 22–32)
Calcium: 9.5 mg/dL (ref 8.9–10.3)
Chloride: 105 mmol/L (ref 98–111)
Creatinine, Ser: 2.52 mg/dL — ABNORMAL HIGH (ref 0.44–1.00)
GFR, Estimated: 19 mL/min — ABNORMAL LOW (ref >60)
Glucose, Bld: 224 mg/dL — ABNORMAL HIGH (ref 70–99)
Potassium: 4.9 mmol/L (ref 3.5–5.1)
Sodium: 139 mmol/L (ref 135–145)

## 2023-04-24 NOTE — TOC Transition Note (Signed)
Transition of Care [facility]) - CM/SW Discharge Note   Patient Details  Name: Tonya Glass MRN: [id number] Date of Birth: 08/09/1945  [facility]) CM/SW Contact:  Michaela Corner, LCSWA Phone Number: 04/24/2023, 12:34 PM   Clinical Narrative:   Patient will DC to: [facility] SNF Anticipated DC date: 04/24/2023 Family notified: Beth - dtr  Transport by: ptar    Per MD patient ready for DC to [facility]. RN to call report prior to discharge [phone]; room 10). RN, patient, patient's family, and facility notified of DC. Discharge Summary and FL2 sent to facility. DC packet on chart. Ambulance transport requested for patient.   CSW will sign off for now as social work intervention is no longer needed. Please consult [place] again if new needs arise.     Final next level of care: Skilled Nursing Facility Barriers to Discharge: Barriers Resolved   Patient Goals and CMS Choice      Discharge Placement                  Patient to be transferred to facility by: Ptar Name of family member notified: Beth - dtr Patient and family notified of of transfer: 04/24/23  Discharge Plan and Services Additional resources added to the After Visit Summary for                                       Social Determinants of Health (SDOH) Interventions SDOH Screenings   Food Insecurity: No Food Insecurity (04/11/2023)  Housing: Low Risk  (04/11/2023)  Transportation Needs: No Transportation Needs (04/11/2023)  Utilities: Not At Risk (04/11/2023)  Social Connections: Unknown (09/25/2021)   Received from [facility], [facility]  Tobacco Use: Medium Risk (04/10/2023)     Readmission Risk Interventions     No data to display

## 2023-04-24 NOTE — NC FL2 (Signed)
[place] MEDICAID FL2 LEVEL OF CARE FORM     IDENTIFICATION  Patient Name: Tonya Glass Birthdate: 12/03/1945 Sex: female Admission Date (Current Location): 04/10/2023  [facility] and [place] Number:  Producer, television/film/video and Address:  [facility], [address], [place], [place] [address]      Provider Number: [id number]  Attending Physician Name and Address:  Billey Co, MD  Relative Name and Phone Number:       Current Level of Care: Hospital Recommended Level of Care: Skilled Nursing Facility Prior Approval Number:    Date Approved/Denied:   PASRR Number: [id number]  Discharge Plan: SNF    Current Diagnoses: Patient Active Problem List   Diagnosis Date Noted   SOB (shortness of breath) 04/18/2023   Pulmonary hypertension, unspecified (HCC) 04/15/2023   Hospital-acquired pneumonia 04/15/2023   Acute systolic heart failure (HCC) 04/14/2023   Acute kidney injury 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) 04/11/2023   Anemia 04/11/2023   COPD (chronic obstructive pulmonary disease) (HCC) 04/11/2023   GERD (gastroesophageal reflux disease) 04/11/2023   Acute exacerbation of CHF (congestive heart failure) (HCC) 04/10/2023   Pressure injury of skin 06/23/2022   Cor pulmonale, chronic (HCC) 09/12/2021   PAH (pulmonary artery hypertension) (HCC) 09/12/2021   Peripheral venous insufficiency 09/12/2021   Stage 3b chronic kidney disease (HCC) 09/12/2021   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   Paravalvular leak (prosthetic valve) 08/07/2021   Nonrheumatic mitral valve regurgitation    Venous stasis ulcers of both lower extremities (HCC) 04/13/2021   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   Paroxysmal atrial flutter (HCC) 04/13/2021   Thoracic aortic aneurysm without rupture (HCC) 03/19/2018   Nonrheumatic aortic (valve) insufficiency  03/19/2018   Acute CHF (congestive heart failure) (HCC) 08/18/2017   Acute diastolic CHF (congestive heart failure) (HCC) 08/18/2017   CHF (congestive heart failure) (HCC) 08/18/2017   Hypothyroidism 07/23/2017   Acute on chronic hypoxic respiratory failure (HCC) 07/23/2017   Aortic valve stenosis 10/10/2016   Diastolic murmur 08/02/2016   SSS (sick sinus syndrome) (HCC) 03/20/2016   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   Atherosclerosis of native coronary artery of native heart without angina pectoris 09/06/2014   Pacemaker 05/28/2013   Essential hypertension 05/28/2013   Dyslipidemia 05/28/2013   S/P aortic valve replacement with bioprosthetic valve 05/28/2013   DM2 (diabetes mellitus, type 2) (HCC) 05/28/2013   Chronic heart failure (HCC) 05/28/2013    Orientation RESPIRATION BLADDER Height & Weight        Normal External catheter, Continent Weight: 211 lb 10.3 oz (96 kg) Height:  5' (152.4 cm)  BEHAVIORAL SYMPTOMS/MOOD NEUROLOGICAL BOWEL NUTRITION STATUS      Continent Diet (See dc summary)  AMBULATORY STATUS COMMUNICATION OF NEEDS Skin     Verbally Surgical wounds (Wound / Incision - right leg)                       Personal Care Assistance Level of Assistance  Bathing, Feeding, Dressing Bathing Assistance: Maximum assistance Feeding assistance: Limited assistance Dressing Assistance: Maximum assistance     Functional Limitations Info  Sight, Hearing, Speech Sight Info: Adequate Hearing Info: Adequate Speech Info: Adequate    SPECIAL CARE FACTORS FREQUENCY  PT (By licensed PT), OT (By licensed OT)     PT Frequency: 5x week OT Frequency: 5x week  Contractures Contractures Info: Not present    Additional Factors Info  Code Status Code Status Info: DNR limited             Current Medications (04/24/2023):  This is the current hospital active medication list Current Facility-Administered Medications  Medication Dose Route Frequency  Provider Last Rate Last Admin   acetaminophen (TYLENOL) tablet 650 mg  650 mg Oral Q6H PRN Kathleene Hazel, MD   650 mg at 04/16/23 0901   Or   acetaminophen (TYLENOL) suppository 650 mg  650 mg Rectal Q6H PRN Kathleene Hazel, MD       apixaban Everlene Balls) tablet 5 mg  5 mg Oral BID Elberta Fortis, MD   5 mg at 04/24/23 0900   atorvastatin (LIPITOR) tablet 40 mg  40 mg Oral Daily Kathleene Hazel, MD   40 mg at 04/24/23 0900   carvedilol (COREG) tablet 3.125 mg  3.125 mg Oral BID WC Kathleene Hazel, MD   3.125 mg at 04/24/23 9147   feeding supplement (ENSURE ENLIVE / ENSURE PLUS) liquid 237 mL  237 mL Oral Q24H Shitarev, Dimitry, MD   237 mL at 04/22/23 1612   FLUoxetine (PROZAC) capsule 40 mg  40 mg Oral Daily Kathleene Hazel, MD   40 mg at 04/24/23 0859   gabapentin (NEURONTIN) capsule 600 mg  600 mg Oral QHS Kathleene Hazel, MD   600 mg at 04/23/23 2216   Gerhardt's butt cream   Topical PRN Westley Chandler, MD       hydrALAZINE (APRESOLINE) tablet 25 mg  25 mg Oral Q8H Verne Carrow D, MD   25 mg at 04/24/23 1159   insulin aspart (novoLOG) injection 0-9 Units  0-9 Units Subcutaneous TID WC Kathleene Hazel, MD   2 Units at 04/24/23 1200   insulin glargine-yfgn [facility]) injection 6 Units  6 Units Subcutaneous Daily Gerrit Heck, DO   6 Units at 04/24/23 0900   ipratropium-albuterol (DUONEB) 0.5-2.5 (3) MG/3ML nebulizer solution 3 mL  3 mL Nebulization Q4H PRN Kathleene Hazel, MD   3 mL at 04/16/23 1119   isosorbide mononitrate (IMDUR) 24 hr tablet 15 mg  15 mg Oral Daily Kathleene Hazel, MD   15 mg at 04/24/23 0900   levothyroxine (SYNTHROID) tablet 75 mcg  75 mcg Oral Daily Kathleene Hazel, MD   75 mcg at 04/24/23 8295   mometasone-formoterol (DULERA) 200-5 MCG/ACT inhaler 2 puff  2 puff Inhalation BID Kathleene Hazel, MD   2 puff at 04/24/23 0905   multivitamin with minerals tablet 1 tablet  1  tablet Oral Daily Kathleene Hazel, MD   1 tablet at 04/24/23 0900   pantoprazole (PROTONIX) EC tablet 40 mg  40 mg Oral Daily Kathleene Hazel, MD   40 mg at 04/24/23 0900   sodium chloride flush (NS) 0.9 % injection 3 mL  3 mL Intravenous Q12H Verne Carrow D, MD   3 mL at 04/24/23 0901   sodium chloride flush (NS) 0.9 % injection 3 mL  3 mL Intravenous PRN Kathleene Hazel, MD       sucralfate (CARAFATE) tablet 1 g  1 g Oral TID WC & HS Kathleene Hazel, MD   1 g at 04/24/23 1158   torsemide (DEMADEX) tablet 60 mg  60 mg Oral BID Darnell Level, MD   60 mg at 04/24/23 0859   traZODone (DESYREL) tablet 25 mg  25 mg Oral QHS Verne Carrow  D, MD   25 mg at 04/23/23 2216   umeclidinium bromide (INCRUSE ELLIPTA) 62.5 MCG/ACT 1 puff  1 puff Inhalation Daily Kathleene Hazel, MD   1 puff at 04/24/23 0905     Discharge Medications: Please see discharge summary for a list of discharge medications.  Relevant Imaging Results:  Relevant Lab Results:   Additional Information SS# [id number] [place], [place]

## 2023-04-24 NOTE — Progress Notes (Signed)
Pt being d/c to SNF, AVS printed, VSS, IV removed, Report called.   Balinda Quails, RN 04/24/2023 12:20 PM

## 2023-04-24 NOTE — TOC Progression Note (Addendum)
Transition of Care [facility]) - Progression Note    Patient Details  Name: Malasha Everman MRN: [id number] Date of Birth: August 14, 1945  Transition of Care [facility]) CM/SW Contact  Michaela Corner, [place] Phone Number: 04/24/2023, 11:43 AM  Clinical Narrative:   CSW called [employer] to discuss pending auth. Per [employer], ins Berkley Harvey is still pending. CSW asked rep if [facility] Grumbling needs any updated notes - per representative they do not need documentation at this time and will call CSW if need be.  CSW confirmed that we are only waiting on auth status to be approved at this time - [employer] representative stated yes and there is nothing for CSW to do at this time.   12:02PM: CSW spoke w/ pts dtr about ins authorization process. Dtr, Waynetta Sandy, gave verbal understanding of process.   12:06PM: Auth approved - 04/23/2023-04/25/2023; CSW spoke with [facility] to confirm pt DC back today, [facility] can accept pt back, will need DC summary.      Barriers to Discharge: Continued Medical Work up  Expected Discharge Plan and Services       Living arrangements for the past 2 months: Assisted Living Facility                                       Social Determinants of Health (SDOH) Interventions SDOH Screenings   Food Insecurity: No Food Insecurity (04/11/2023)  Housing: Low Risk  (04/11/2023)  Transportation Needs: No Transportation Needs (04/11/2023)  Utilities: Not At Risk (04/11/2023)  Social Connections: Unknown (09/25/2021)   Received from [facility], [facility]  Tobacco Use: Medium Risk (04/10/2023)    Readmission Risk Interventions     No data to display

## 2023-04-25 LAB — CUP PACEART REMOTE DEVICE CHECK
Battery Remaining Longevity: 96 mo
Battery Remaining Percentage: 100 %
Brady Statistic RA Percent Paced: 26 %
Brady Statistic RV Percent Paced: 100 %
Date Time Interrogation Session: 20241211155500
Implantable Lead Connection Status: 753985
Implantable Lead Connection Status: 753985
Implantable Lead Implant Date: 20140908
Implantable Lead Implant Date: 20140908
Implantable Lead Location: 753859
Implantable Lead Location: 753860
Implantable Lead Model: 4135
Implantable Lead Model: 4136
Implantable Lead Serial Number: 29485718
Implantable Lead Serial Number: 29487623
Implantable Pulse Generator Implant Date: 20240209
Lead Channel Impedance Value: 572 Ohm
Lead Channel Impedance Value: 677 Ohm
Lead Channel Pacing Threshold Amplitude: 1.1 V
Lead Channel Pacing Threshold Pulse Width: 0.4 ms
Lead Channel Setting Pacing Amplitude: 1.6 V
Lead Channel Setting Pacing Amplitude: 2 V
Lead Channel Setting Pacing Pulse Width: 0.4 ms
Lead Channel Setting Sensing Sensitivity: 2.5 mV
Pulse Gen Serial Number: 710644
Zone Setting Status: 755011

## 2023-04-26 IMAGING — CT CT HEART MORP W/ CTA COR W/ SCORE W/ CA W/CM &/OR W/O CM
2 of 7 series · 11 of 20 positions shown, 13 images · non-contrast
Comparison: None.
COMPARISON: None.

Addendum:
EXAM:
OVER-READ INTERPRETATION  CT CHEST

The following report is an over-read performed by radiologist Dr.
Mikee Ziegler [REDACTED] on 07/06/2021. This
over-read does not include interpretation of cardiac or coronary
anatomy or pathology. The coronary calcium score/coronary CTA
interpretation by the cardiologist is attached.
CLINICAL DATA: 75-year-old female with aortic valve replacement (23
mm Perimount magna ease), evaluate paravalvular leak
Cardiac CT
TECHNIQUE: The patient was scanned on a Phillips Force scanner. A 120 kV
retrospective scan was triggered in the descending thoracic aorta at
111 HU's. Gantry rotation speed was 250 msecs and collimation was .6
mm. No beta blockade or nitro were given. The 3D data set was
reconstructed in 5% intervals of the R-R cycle. Systolic and
diastolic phases were analyzed on a dedicated work station using
MPR, MIP and VRT modes. The patient received 80 cc of contrast.

[Series 6: 0-90% · axial · 0.39mm/px · z∈[-77,+50]mm · 5 of 3170 slices shown]
[im 529/3170  vessel]
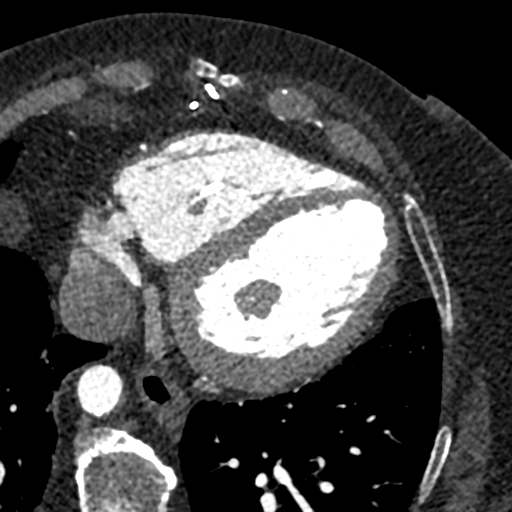
[im 1057/3170  vessel]
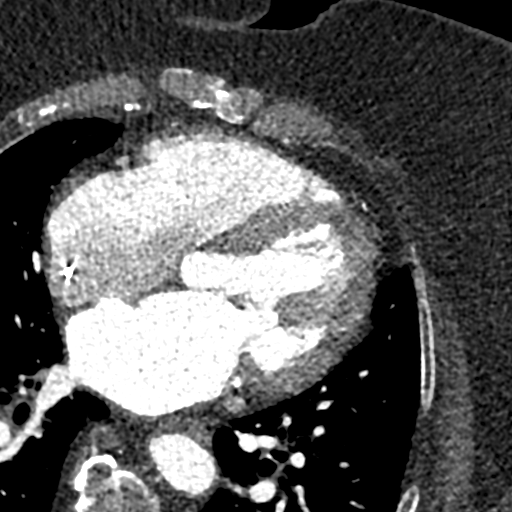
[im 1585/3170  vessel]
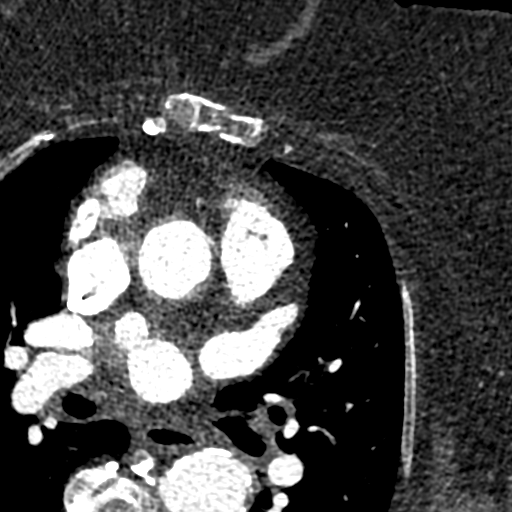
[im 2113/3170  vessel]
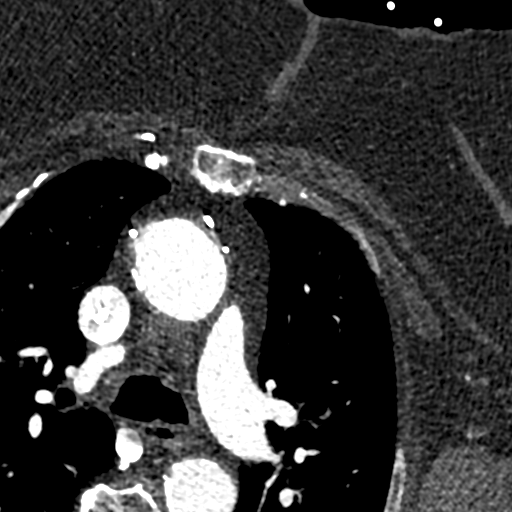
[im 2641/3170  vessel]
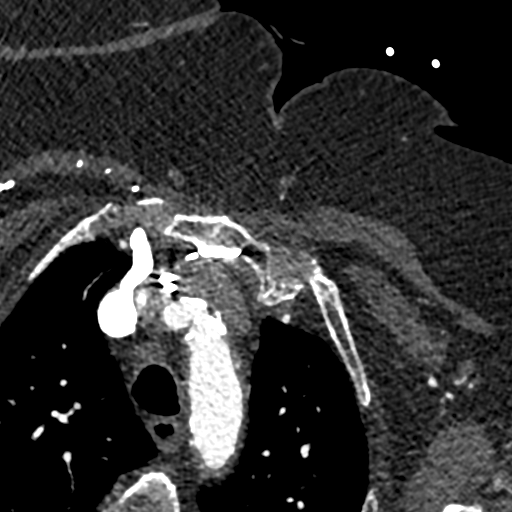

[Series 7: 5-95% · axial · 0.39mm/px · z∈[-81,+55]mm · 6 of 3170 slices shown, 8 images]
[im 453/3170  vessel]
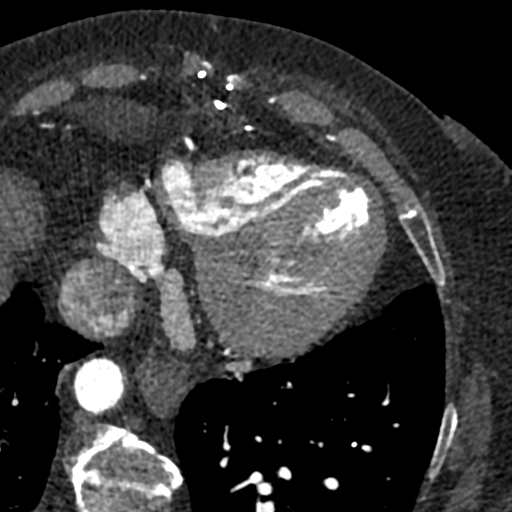
[im 453/3170  lung]
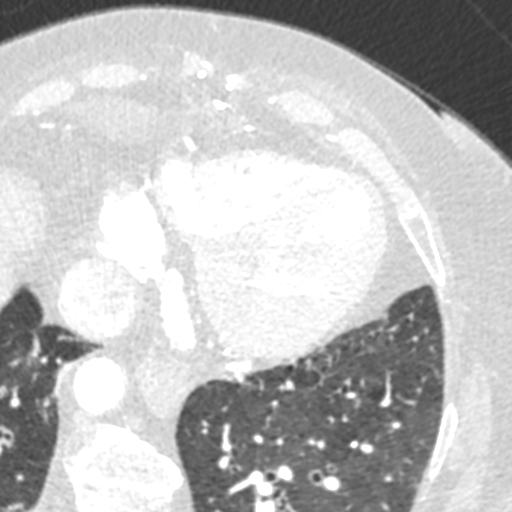
[im 906/3170  vessel]
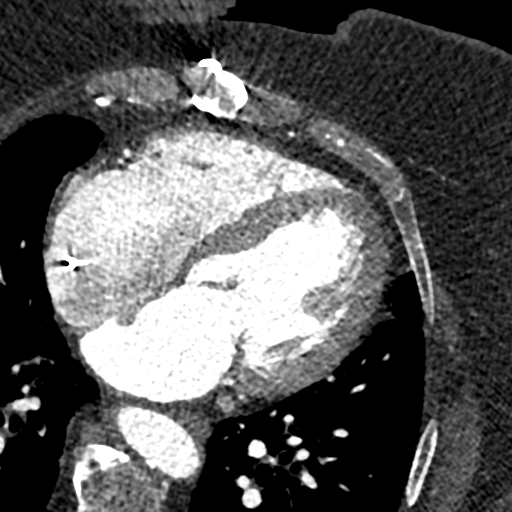
[im 1359/3170  vessel]
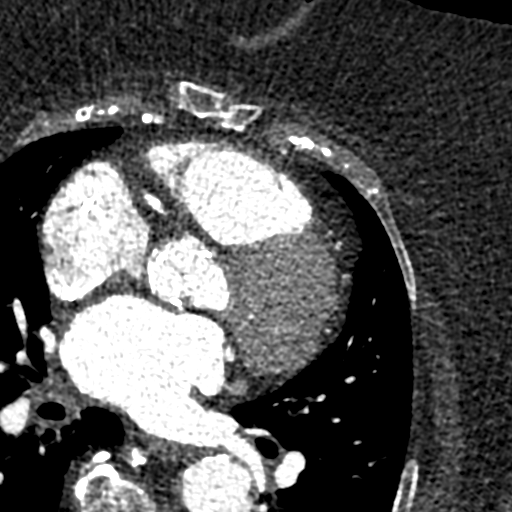
[im 1811/3170  vessel]
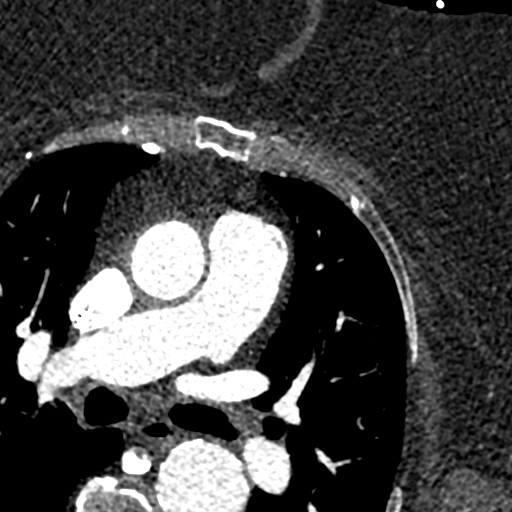
[im 2264/3170  vessel]
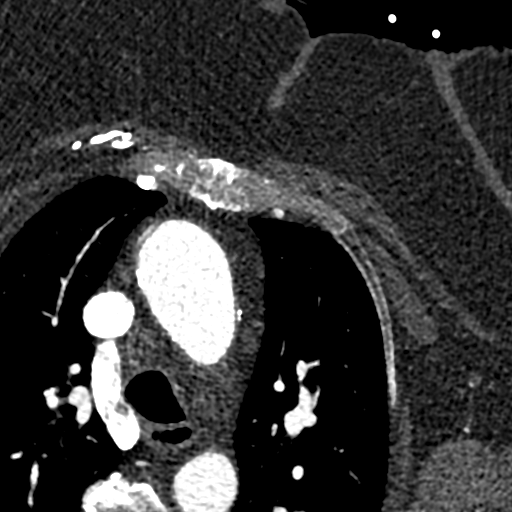
[im 2264/3170  lung]
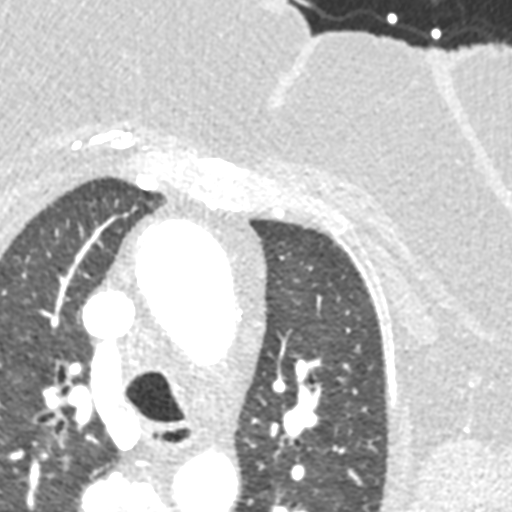
[im 2717/3170  vessel]
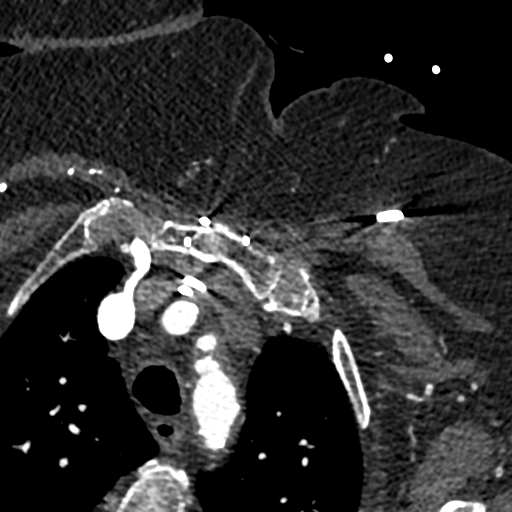

[11 of 20 positions shown; findings below may reference images not displayed]

FINDINGS: Extracardiac findings will be described separately under dictation
for contemporaneously obtained CTA chest, abdomen and pelvis.
IMPRESSION: Please see separate dictation for contemporaneously obtained CTA
chest, abdomen and pelvis dated 07/06/2021 for full description of
relevant extracardiac findings.
FINDINGS: Aortic valve: S/p 23 mm Perimount magna ease AVR. There is a
paravalular leak adjacent to the left coronary cusp. PVL measures
8mm x 3mm in diameter with perimeter 20mm and area 20mm^2, and
involved angle 34 degrees. There also appears to be a second small
PVL in the left coronary cusp close to the commissure between the
left and noncoronary cusps that measures 4mm x 2mm in diamater with
perimeter 11mm and area 6mm^2, with involved angle 15 degrees.

Right atrium: Mild enlargement

Right ventricle: Normal size.  Dual chamber pacemaker

Pulmonary arteries: Normal size

Pulmonary veins: Normal configuration

Left atrium: Moderate enlargement

Left ventricle: Moderate dilatation

Pericardium: Normal thickness

Aprta: S/p aortic root repair with 28 mm Terumo graft

Coronary arteries: Calcium score 500 (86th percentile)
IMPRESSION: 1.  Status post 23mm Perimount Magna Ease aortic valve replacement

2. There is a paravalular leak adjacent to the left coronary cusp.
PVL measures 8mm x 3mm in diameter with perimeter 20mm and area
20mm^2, and involved angle 34 degrees. There also appears to be a
second small PVL in the left coronary cusp close to the commissure
between the left and noncoronary cusps that measures 4mm x 2mm in
diameter with perimeter 11mm and area 6mm^2, and involved angle 15
degrees.

3. Coronary calcium score 500 (86th percentile)

*** End of Addendum ***
EXAM:
OVER-READ INTERPRETATION  CT CHEST

The following report is an over-read performed by radiologist Dr.
Mikee Ziegler [REDACTED] on 07/06/2021. This
over-read does not include interpretation of cardiac or coronary
anatomy or pathology. The coronary calcium score/coronary CTA
interpretation by the cardiologist is attached.
FINDINGS: Extracardiac findings will be described separately under dictation
for contemporaneously obtained CTA chest, abdomen and pelvis.
IMPRESSION: Please see separate dictation for contemporaneously obtained CTA
chest, abdomen and pelvis dated 07/06/2021 for full description of
relevant extracardiac findings.

## 2023-04-26 IMAGING — CT CT ANGIO CHEST
2 of 7 series · 15 of 36 positions shown · IV contrast (APPLIED)
Comparison: Chest CT 04/02/2018.

CLINICAL DATA: 75-year-old male status post aortic valve
replacement with aortic regurgitation/paravalvular leak. Evaluate
for potential transcatheter aortic valve replacement (TAVR)
procedure.

EXAM:
CT ANGIOGRAPHY CHEST, ABDOMEN AND PELVIS
TECHNIQUE: Multidetector CT imaging through the chest, abdomen and pelvis was
performed using the standard protocol during bolus administration of
intravenous contrast. Multiplanar reconstructed images and MIPs were
obtained and reviewed to evaluate the vascular anatomy.

[Series 5: ax thins · axial · 0.59mm/px · z∈[-448,+184]mm · 14 of 707 slices shown]
[im 38/707  lung]
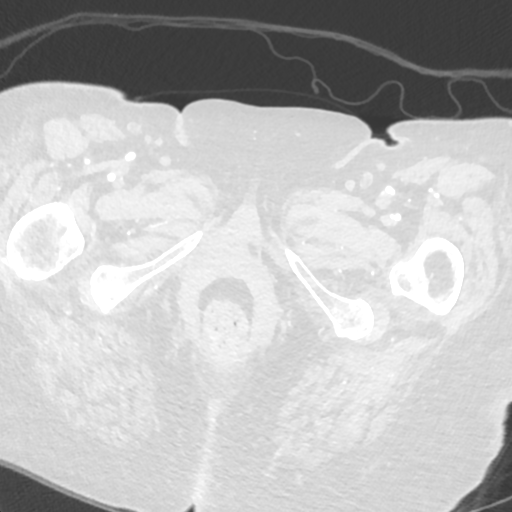
[im 75/707  mediastinal]
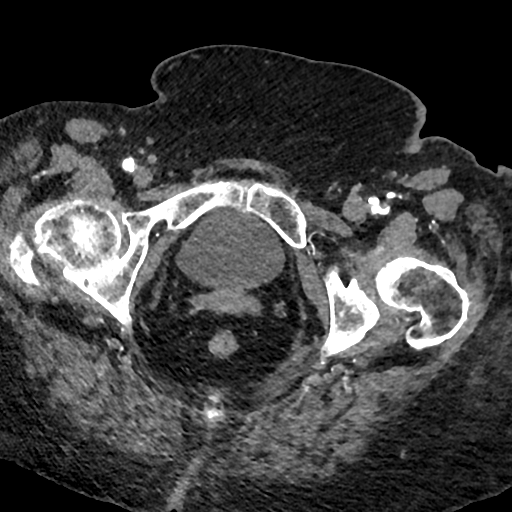
[im 149/707  lung]
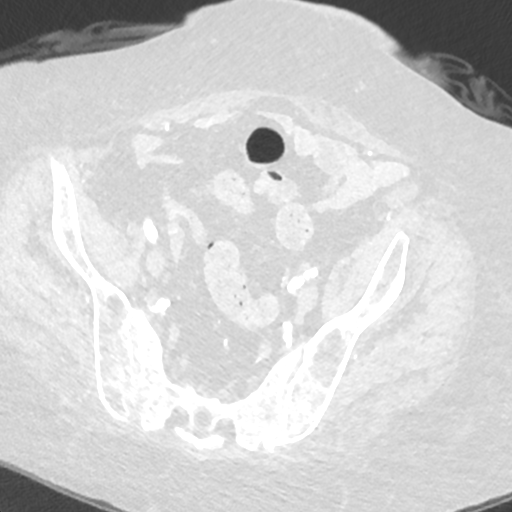
[im 186/707  mediastinal]
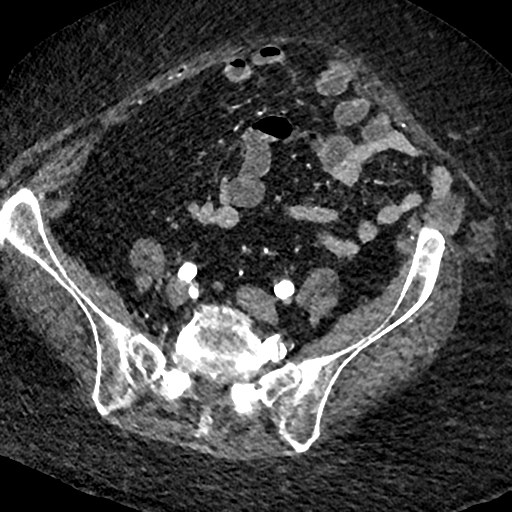
[im 223/707  lung]
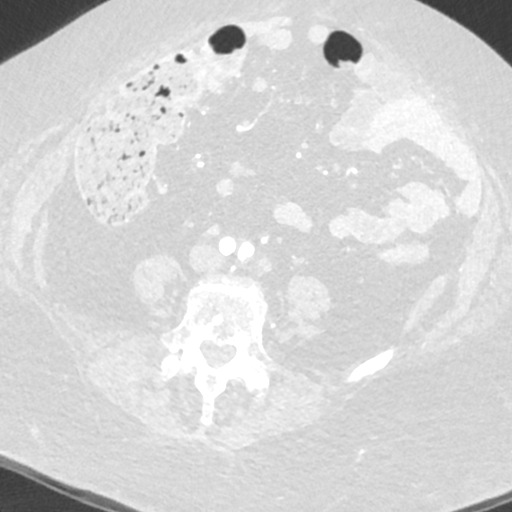
[im 298/707  mediastinal]
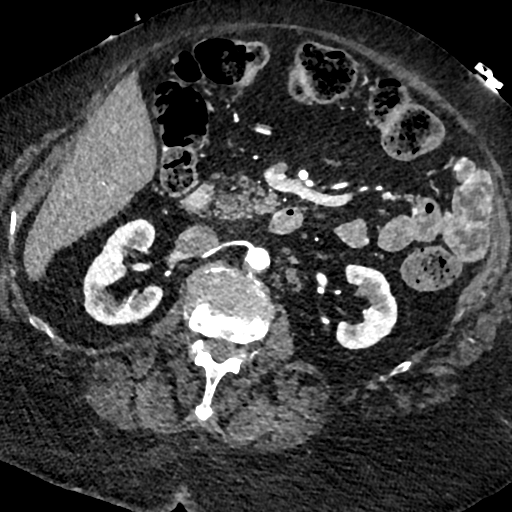
[im 335/707  lung]
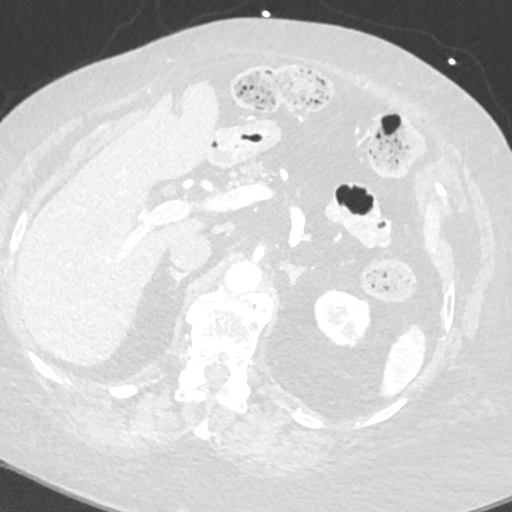
[im 372/707  mediastinal]
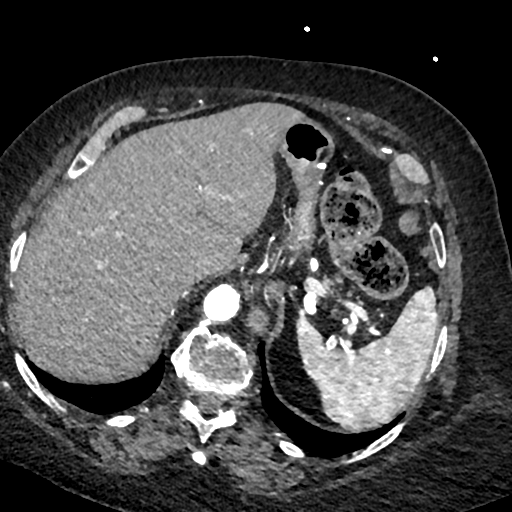
[im 409/707  lung]
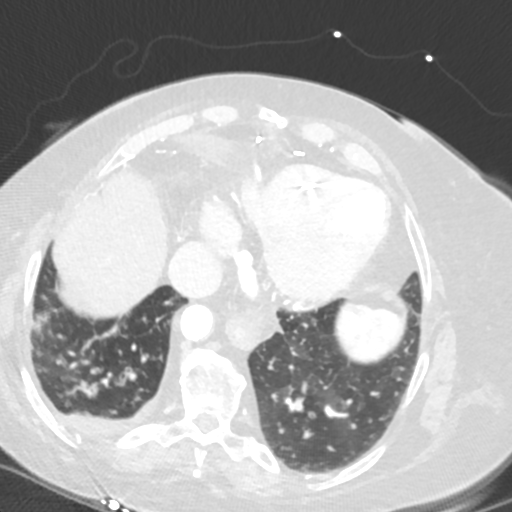
[im 484/707  mediastinal]
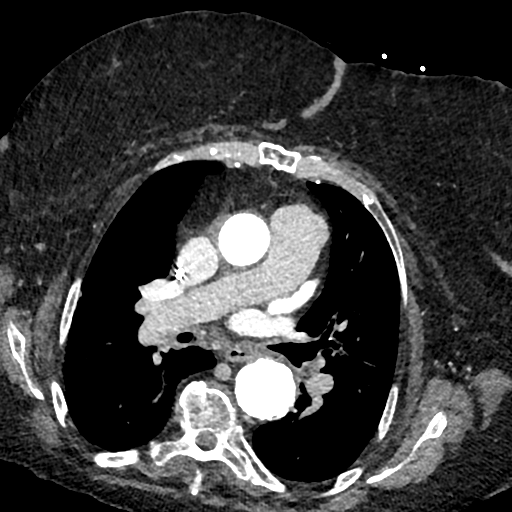
[im 521/707  lung]
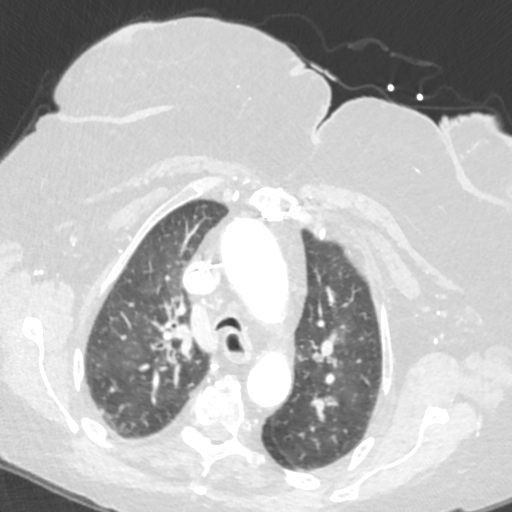
[im 558/707  mediastinal]
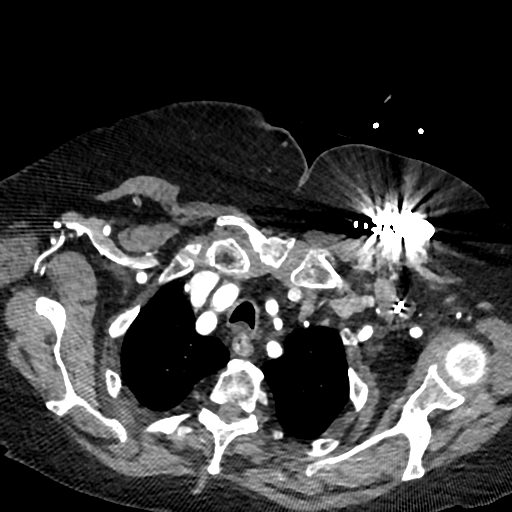
[im 632/707  lung]
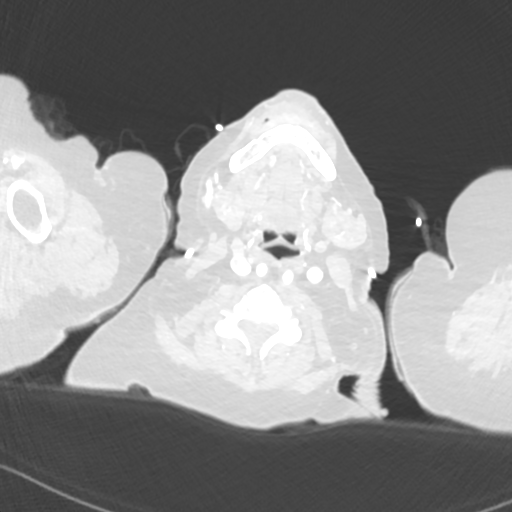
[im 669/707  mediastinal]
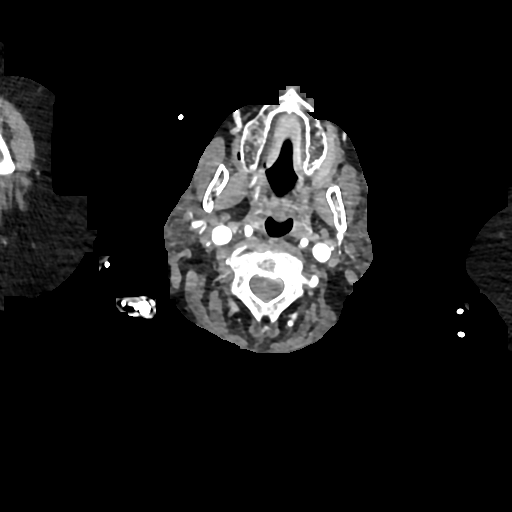

[Series 8: cor · coronal · 0.92mm/px · 1 of 167 slices shown]
[im 84/167  mediastinal]
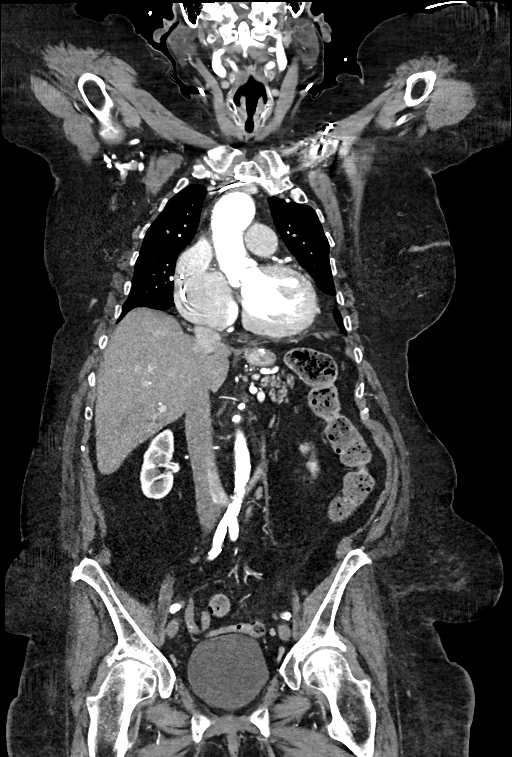

[15 of 36 positions shown; findings below may reference images not displayed]

RADIATION DOSE REDUCTION: This exam was performed according to the
departmental dose-optimization program which includes automated
exposure control, adjustment of the mA and/or kV according to
patient size and/or use of iterative reconstruction technique.

CONTRAST:  95mL OMNIPAQUE IOHEXOL 350 MG/ML SOLN
FINDINGS: CTA CHEST FINDINGS

Cardiovascular: Heart size is mildly enlarged. There is no
significant pericardial fluid, thickening or pericardial
calcification. There is aortic atherosclerosis, as well as
atherosclerosis of the great vessels of the mediastinum and the
coronary arteries, including calcified atherosclerotic plaque in the
left main, left anterior descending, left circumflex and right
coronary arteries. Fusiform ectasia of the proximal descending
thoracic aorta measuring up to 3.5 x 3.6 cm. Status post median
sternotomy for aortic valve replacement with a stented
bioprosthesis. Left-sided pacemaker with lead tips terminating in
the right atrium and right ventricular apex. Separate origin of the
left vertebral artery directly off the aortic arch (normal
anatomical variant) incidentally noted.

Mediastinum/Lymph Nodes: No pathologically enlarged mediastinal or
hilar lymph nodes. Moderate-sized hiatal hernia. No axillary
lymphadenopathy.

Lungs/Pleura: New left lower lobe pulmonary nodule measuring 1.6 x
1.3 cm (axial image 54 of series 7). Mild irregularity of the
surfaces of the inferior aspect of the right hemithorax posteriorly
where there is a trace amount of loculated pleural fluid. No left
pleural effusion. No acute consolidative airspace disease.
Substantial collapse of the trachea, indicative of tracheomalacia.

Musculoskeletal/Soft Tissues: There are no aggressive appearing
lytic or blastic lesions noted in the visualized portions of the
skeleton. Median sternotomy wires.

CTA ABDOMEN AND PELVIS FINDINGS

Hepatobiliary: No suspicious cystic or solid hepatic lesions. No
intra or extrahepatic biliary ductal dilatation. Status post
cholecystectomy.

Pancreas: No pancreatic mass. No pancreatic ductal dilatation. No
pancreatic or peripancreatic fluid collections or inflammatory
changes.

Spleen: Unremarkable.

Adrenals/Urinary Tract: Bilateral kidneys and bilateral adrenal
glands are normal in appearance. No hydroureteronephrosis. Urinary
bladder is normal in appearance.

Stomach/Bowel: Postoperative changes of sleeve gastrectomy. No
pathologic dilatation of small bowel or colon. The appendix is not
confidently identified and may be surgically absent. Regardless,
there are no inflammatory changes noted adjacent to the cecum to
suggest the presence of an acute appendicitis at this time.

Vascular/Lymphatic: Aortic atherosclerosis, without evidence of
aneurysm or dissection in the abdominal or pelvic vasculature.
Vascular findings and measurements pertinent to potential TAVR
procedure, as detailed below. Retroaortic left renal vein (normal
anatomical variant) incidentally noted. No lymphadenopathy noted in
the abdomen or pelvis.

Reproductive: Uterus and ovaries are atrophic.

Other: Small umbilical hernia containing only omental fat. No
significant volume of ascites. No pneumoperitoneum.

Musculoskeletal: There are no aggressive appearing lytic or blastic
lesions noted in the visualized portions of the skeleton.

VASCULAR MEASUREMENTS PERTINENT TO TAVR:

AORTA:

Minimal Aortic Yiameter-55 x 10 mm

Severity of Aortic Calcification-moderate to severe

RIGHT PELVIS:

Right Common Iliac Artery -

Minimal Hiameter-R.4 x 5.1 mm

Tortuosity-mild

Calcification-severe

Right External Iliac Artery -

Minimal Xiameter-D.I x 5.0 mm

Tortuosity-mild-to-moderate

Calcification-severe

Right Common Femoral Artery -

Minimal 7iameter-4.4 x 4.2 mm

Tortuosity-mild

Calcification-moderate

LEFT PELVIS:

Left Common Iliac Artery -

Minimal Siameter-Z.F x 5.2 mm

Tortuosity-moderate

Calcification-moderate to severe

Left External Iliac Artery -

Minimal Xiameter-D.I x 4.0 mm

Tortuosity-moderate

Calcification-severe

Left Common Femoral Artery -

Minimal Kiameter-H.7 x 3.0 mm

Tortuosity-mild

Calcification-moderate

Review of the MIP images confirms the above findings.
IMPRESSION: 1. Vascular findings and measurements pertinent to potential TAVR
procedure, as detailed above.
2. Status post median sternotomy for aortic valve replacement with a
stented bioprosthesis.
3. New 1.6 x 1.3 cm left lower lobe pulmonary nodule concerning for
primary bronchogenic neoplasm. Consider one of the following in 3
months for both low-risk and high-risk individuals: (a) repeat chest
CT or (b) follow-up PET-CT. This recommendation follows the
consensus statement: Guidelines for Management of Incidental
Pulmonary Nodules Detected on CT Images: From the [HOSPITAL]
4. Trace amount of apparently loculated right-sided pleural fluid.
Attention at time of repeat chest CT is recommended to ensure
stability or resolution.
5. Cardiomegaly.
6. Tracheomalacia.
7. Small umbilical hernia containing only omental fat. No associated
bowel incarceration or obstruction at this time.
8. Additional incidental findings, as above.

## 2023-04-30 ENCOUNTER — Encounter (HOSPITAL_COMMUNITY): Payer: Self-pay

## 2023-04-30 ENCOUNTER — Emergency Department (HOSPITAL_COMMUNITY): Payer: 59

## 2023-04-30 ENCOUNTER — Inpatient Hospital Stay (HOSPITAL_COMMUNITY)
Admission: EM | Admit: 2023-04-30 | Discharge: 2023-05-05 | DRG: 377 | Disposition: A | Discharge status: Skilled Nursing Facility | Payer: 59 | Attending: Family Medicine | Admitting: Family Medicine

## 2023-04-30 ENCOUNTER — Other Ambulatory Visit: Payer: Self-pay

## 2023-04-30 DIAGNOSIS — E1140 Type 2 diabetes mellitus with diabetic neuropathy, unspecified: Secondary | ICD-10-CM | POA: Diagnosis present

## 2023-04-30 DIAGNOSIS — M810 Age-related osteoporosis without current pathological fracture: Secondary | ICD-10-CM | POA: Diagnosis present

## 2023-04-30 DIAGNOSIS — E1122 Type 2 diabetes mellitus with diabetic chronic kidney disease: Secondary | ICD-10-CM | POA: Diagnosis present

## 2023-04-30 DIAGNOSIS — Z79899 Other long term (current) drug therapy: Secondary | ICD-10-CM

## 2023-04-30 DIAGNOSIS — Z9981 Dependence on supplemental oxygen: Secondary | ICD-10-CM

## 2023-04-30 DIAGNOSIS — E119 Type 2 diabetes mellitus without complications: Secondary | ICD-10-CM

## 2023-04-30 DIAGNOSIS — K264 Chronic or unspecified duodenal ulcer with hemorrhage: Secondary | ICD-10-CM | POA: Diagnosis present

## 2023-04-30 DIAGNOSIS — I2510 Atherosclerotic heart disease of native coronary artery without angina pectoris: Secondary | ICD-10-CM | POA: Diagnosis present

## 2023-04-30 DIAGNOSIS — Z79890 Hormone replacement therapy (postmenopausal): Secondary | ICD-10-CM

## 2023-04-30 DIAGNOSIS — Z87891 Personal history of nicotine dependence: Secondary | ICD-10-CM

## 2023-04-30 DIAGNOSIS — I5043 Acute on chronic combined systolic (congestive) and diastolic (congestive) heart failure: Secondary | ICD-10-CM

## 2023-04-30 DIAGNOSIS — N179 Acute kidney failure, unspecified: Secondary | ICD-10-CM | POA: Diagnosis present

## 2023-04-30 DIAGNOSIS — Z9842 Cataract extraction status, left eye: Secondary | ICD-10-CM

## 2023-04-30 DIAGNOSIS — I4892 Unspecified atrial flutter: Secondary | ICD-10-CM | POA: Diagnosis present

## 2023-04-30 DIAGNOSIS — J440 Chronic obstructive pulmonary disease with acute lower respiratory infection: Secondary | ICD-10-CM | POA: Diagnosis present

## 2023-04-30 DIAGNOSIS — K921 Melena: Secondary | ICD-10-CM

## 2023-04-30 DIAGNOSIS — L89312 Pressure ulcer of right buttock, stage 2: Secondary | ICD-10-CM | POA: Diagnosis present

## 2023-04-30 DIAGNOSIS — Z6841 Body Mass Index (BMI) 40.0 and over, adult: Secondary | ICD-10-CM

## 2023-04-30 DIAGNOSIS — Z95810 Presence of automatic (implantable) cardiac defibrillator: Secondary | ICD-10-CM

## 2023-04-30 DIAGNOSIS — J449 Chronic obstructive pulmonary disease, unspecified: Secondary | ICD-10-CM | POA: Diagnosis present

## 2023-04-30 DIAGNOSIS — G4733 Obstructive sleep apnea (adult) (pediatric): Secondary | ICD-10-CM | POA: Diagnosis present

## 2023-04-30 DIAGNOSIS — I5020 Unspecified systolic (congestive) heart failure: Secondary | ICD-10-CM | POA: Insufficient documentation

## 2023-04-30 DIAGNOSIS — Z953 Presence of xenogenic heart valve: Secondary | ICD-10-CM

## 2023-04-30 DIAGNOSIS — K449 Diaphragmatic hernia without obstruction or gangrene: Secondary | ICD-10-CM | POA: Diagnosis present

## 2023-04-30 DIAGNOSIS — Z7951 Long term (current) use of inhaled steroids: Secondary | ICD-10-CM

## 2023-04-30 DIAGNOSIS — I130 Hypertensive heart and chronic kidney disease with heart failure and stage 1 through stage 4 chronic kidney disease, or unspecified chronic kidney disease: Secondary | ICD-10-CM | POA: Diagnosis present

## 2023-04-30 DIAGNOSIS — R195 Other fecal abnormalities: Secondary | ICD-10-CM

## 2023-04-30 DIAGNOSIS — J9811 Atelectasis: Secondary | ICD-10-CM | POA: Diagnosis present

## 2023-04-30 DIAGNOSIS — Z794 Long term (current) use of insulin: Secondary | ICD-10-CM

## 2023-04-30 DIAGNOSIS — I513 Intracardiac thrombosis, not elsewhere classified: Secondary | ICD-10-CM

## 2023-04-30 DIAGNOSIS — D631 Anemia in chronic kidney disease: Secondary | ICD-10-CM | POA: Diagnosis present

## 2023-04-30 DIAGNOSIS — E7800 Pure hypercholesterolemia, unspecified: Secondary | ICD-10-CM | POA: Diagnosis present

## 2023-04-30 DIAGNOSIS — S32000A Wedge compression fracture of unspecified lumbar vertebra, initial encounter for closed fracture: Secondary | ICD-10-CM | POA: Insufficient documentation

## 2023-04-30 DIAGNOSIS — Z961 Presence of intraocular lens: Secondary | ICD-10-CM | POA: Diagnosis present

## 2023-04-30 DIAGNOSIS — Z66 Do not resuscitate: Secondary | ICD-10-CM | POA: Diagnosis present

## 2023-04-30 DIAGNOSIS — Z8673 Personal history of transient ischemic attack (TIA), and cerebral infarction without residual deficits: Secondary | ICD-10-CM

## 2023-04-30 DIAGNOSIS — I4891 Unspecified atrial fibrillation: Secondary | ICD-10-CM | POA: Diagnosis present

## 2023-04-30 DIAGNOSIS — K31811 Angiodysplasia of stomach and duodenum with bleeding: Principal | ICD-10-CM

## 2023-04-30 DIAGNOSIS — Z7901 Long term (current) use of anticoagulants: Secondary | ICD-10-CM

## 2023-04-30 DIAGNOSIS — N184 Chronic kidney disease, stage 4 (severe): Secondary | ICD-10-CM | POA: Diagnosis present

## 2023-04-30 DIAGNOSIS — S31000A Unspecified open wound of lower back and pelvis without penetration into retroperitoneum, initial encounter: Secondary | ICD-10-CM | POA: Insufficient documentation

## 2023-04-30 DIAGNOSIS — R0682 Tachypnea, not elsewhere classified: Secondary | ICD-10-CM | POA: Diagnosis present

## 2023-04-30 DIAGNOSIS — Z515 Encounter for palliative care: Secondary | ICD-10-CM

## 2023-04-30 DIAGNOSIS — T45515A Adverse effect of anticoagulants, initial encounter: Secondary | ICD-10-CM | POA: Diagnosis present

## 2023-04-30 DIAGNOSIS — Z8701 Personal history of pneumonia (recurrent): Secondary | ICD-10-CM

## 2023-04-30 DIAGNOSIS — M4856XA Collapsed vertebra, not elsewhere classified, lumbar region, initial encounter for fracture: Secondary | ICD-10-CM | POA: Diagnosis present

## 2023-04-30 DIAGNOSIS — F32A Depression, unspecified: Secondary | ICD-10-CM | POA: Diagnosis present

## 2023-04-30 DIAGNOSIS — S301XXA Contusion of abdominal wall, initial encounter: Secondary | ICD-10-CM

## 2023-04-30 DIAGNOSIS — Q399 Congenital malformation of esophagus, unspecified: Secondary | ICD-10-CM

## 2023-04-30 DIAGNOSIS — R531 Weakness: Secondary | ICD-10-CM | POA: Diagnosis not present

## 2023-04-30 DIAGNOSIS — S32020A Wedge compression fracture of second lumbar vertebra, initial encounter for closed fracture: Secondary | ICD-10-CM

## 2023-04-30 DIAGNOSIS — D649 Anemia, unspecified: Secondary | ICD-10-CM | POA: Diagnosis present

## 2023-04-30 DIAGNOSIS — E039 Hypothyroidism, unspecified: Secondary | ICD-10-CM | POA: Diagnosis present

## 2023-04-30 DIAGNOSIS — Z7983 Long term (current) use of bisphosphonates: Secondary | ICD-10-CM

## 2023-04-30 DIAGNOSIS — L89322 Pressure ulcer of left buttock, stage 2: Secondary | ICD-10-CM | POA: Diagnosis present

## 2023-04-30 DIAGNOSIS — J441 Chronic obstructive pulmonary disease with (acute) exacerbation: Secondary | ICD-10-CM | POA: Diagnosis present

## 2023-04-30 DIAGNOSIS — J189 Pneumonia, unspecified organism: Principal | ICD-10-CM | POA: Diagnosis present

## 2023-04-30 DIAGNOSIS — E66813 Obesity, class 3: Secondary | ICD-10-CM | POA: Diagnosis present

## 2023-04-30 DIAGNOSIS — R9431 Abnormal electrocardiogram [ECG] [EKG]: Secondary | ICD-10-CM

## 2023-04-30 DIAGNOSIS — L89151 Pressure ulcer of sacral region, stage 1: Secondary | ICD-10-CM | POA: Diagnosis present

## 2023-04-30 DIAGNOSIS — D62 Acute posthemorrhagic anemia: Principal | ICD-10-CM | POA: Diagnosis present

## 2023-04-30 DIAGNOSIS — I2781 Cor pulmonale (chronic): Secondary | ICD-10-CM | POA: Diagnosis present

## 2023-04-30 DIAGNOSIS — R413 Other amnesia: Secondary | ICD-10-CM | POA: Diagnosis present

## 2023-04-30 DIAGNOSIS — I350 Nonrheumatic aortic (valve) stenosis: Secondary | ICD-10-CM | POA: Diagnosis present

## 2023-04-30 DIAGNOSIS — E8809 Other disorders of plasma-protein metabolism, not elsewhere classified: Secondary | ICD-10-CM | POA: Diagnosis present

## 2023-04-30 DIAGNOSIS — Z9841 Cataract extraction status, right eye: Secondary | ICD-10-CM

## 2023-04-30 HISTORY — DX: Anemia, unspecified: D64.9

## 2023-04-30 HISTORY — DX: Chronic kidney disease, unspecified: N18.9

## 2023-04-30 HISTORY — DX: Chronic systolic (congestive) heart failure: I50.22

## 2023-04-30 LAB — CBC WITH DIFFERENTIAL/PLATELET
Abs Immature Granulocytes: 0.09 10*3/uL — ABNORMAL HIGH (ref 0.00–0.07)
Basophils Absolute: 0 10*3/uL (ref 0.0–0.1)
Basophils Relative: 0 %
Eosinophils Absolute: 0.2 10*3/uL (ref 0.0–0.5)
Eosinophils Relative: 2 %
HCT: 25.3 % — ABNORMAL LOW (ref 36.0–46.0)
Hemoglobin: 7.4 g/dL — ABNORMAL LOW (ref 12.0–15.0)
Immature Granulocytes: 1 %
Lymphocytes Relative: 17 %
Lymphs Abs: 1.6 10*3/uL (ref 0.7–4.0)
MCH: 29.2 pg (ref 26.0–34.0)
MCHC: 29.2 g/dL — ABNORMAL LOW (ref 30.0–36.0)
MCV: 100 fL (ref 80.0–100.0)
Monocytes Absolute: 0.8 10*3/uL (ref 0.1–1.0)
Monocytes Relative: 8 %
Neutro Abs: 6.8 10*3/uL (ref 1.7–7.7)
Neutrophils Relative %: 72 %
Platelets: 261 10*3/uL (ref 150–400)
RBC: 2.53 MIL/uL — ABNORMAL LOW (ref 3.87–5.11)
RDW: 17.3 % — ABNORMAL HIGH (ref 11.5–15.5)
WBC: 9.5 10*3/uL (ref 4.0–10.5)
nRBC: 0 % (ref 0.0–0.2)

## 2023-04-30 LAB — I-STAT CHEM 8, ED
BUN: 71 mg/dL — ABNORMAL HIGH (ref 8–23)
Calcium, Ion: 1.12 mmol/L — ABNORMAL LOW (ref 1.15–1.40)
Chloride: 101 mmol/L (ref 98–111)
Creatinine, Ser: 2.2 mg/dL — ABNORMAL HIGH (ref 0.44–1.00)
Glucose, Bld: 86 mg/dL (ref 70–99)
HCT: 22 % — ABNORMAL LOW (ref 36.0–46.0)
Hemoglobin: 7.5 g/dL — ABNORMAL LOW (ref 12.0–15.0)
Potassium: 3.6 mmol/L (ref 3.5–5.1)
Sodium: 138 mmol/L (ref 135–145)
TCO2: 27 mmol/L (ref 22–32)

## 2023-04-30 LAB — COMPREHENSIVE METABOLIC PANEL
ALT: 16 U/L (ref 0–44)
AST: 19 U/L (ref 15–41)
Albumin: 2.8 g/dL — ABNORMAL LOW (ref 3.5–5.0)
Alkaline Phosphatase: 72 U/L (ref 38–126)
Anion gap: 11 (ref 5–15)
BUN: 71 mg/dL — ABNORMAL HIGH (ref 8–23)
CO2: 27 mmol/L (ref 22–32)
Calcium: 9 mg/dL (ref 8.9–10.3)
Chloride: 102 mmol/L (ref 98–111)
Creatinine, Ser: 2.05 mg/dL — ABNORMAL HIGH (ref 0.44–1.00)
GFR, Estimated: 25 mL/min — ABNORMAL LOW (ref >60)
Glucose, Bld: 91 mg/dL (ref 70–99)
Potassium: 3.6 mmol/L (ref 3.5–5.1)
Sodium: 140 mmol/L (ref 135–145)
Total Bilirubin: 1.2 mg/dL — ABNORMAL HIGH (ref <1.2)
Total Protein: 6 g/dL — ABNORMAL LOW (ref 6.5–8.1)

## 2023-04-30 LAB — PROTIME-INR
INR: 1.9 — ABNORMAL HIGH (ref 0.8–1.2)
Prothrombin Time: 21.6 s — ABNORMAL HIGH (ref 11.4–15.2)

## 2023-04-30 LAB — TROPONIN I (HIGH SENSITIVITY): Troponin I (High Sensitivity): 57 ng/L — ABNORMAL HIGH (ref <18)

## 2023-04-30 LAB — POC OCCULT BLOOD, ED: Fecal Occult Bld: POSITIVE — AB

## 2023-04-30 MED ORDER — SODIUM CHLORIDE 0.9 % IV SOLN
1.0000 g | Freq: Once | INTRAVENOUS | Status: AC
  Administered 2023-04-30: 1 g via INTRAVENOUS
  Filled 2023-04-30: qty 10

## 2023-04-30 MED ORDER — SODIUM CHLORIDE 0.9 % IV SOLN
500.0000 mg | Freq: Once | INTRAVENOUS | Status: AC
  Administered 2023-05-01: 500 mg via INTRAVENOUS
  Filled 2023-04-30: qty 5

## 2023-04-30 NOTE — ED Provider Notes (Signed)
  Physical Exam  BP 129/63   Pulse 64   Temp 98.7 F (37.1 C) (Oral)   Resp 18   Ht 5' (1.524 m)   Wt 96 kg   SpO2 100%   BMI 41.33 kg/m   Physical Exam  Procedures  Procedures  ED Course / MDM    Medical Decision Making Amount and/or Complexity of Data Reviewed Labs: ordered. Radiology: ordered.  Risk Decision regarding hospitalization.   Admit for multifocal pneumonia, suspected GI bleed. Has L2 compression deformity with TSLO in place. Consult already placed and pending.

## 2023-04-30 NOTE — ED Provider Notes (Signed)
[place] [facility] AT [facility] Provider Note   CSN: [id number] Arrival date & time: 04/30/23  1929     History  Chief Complaint  Patient presents with   Weakness    Tonya Glass is a 77 y.o. female, hx of DMII, aortic valve stenosis, pacemaker, CVA, who presents to the ED 2/2 to low hemoglobin.  She is from [place], [place], and she states she has a buttocks pain, and back pain for the last couple days.  She is now also stating that she has some chest pain, that just hurts, that started this morning.  She reports progressive weakness, for the last week.  Denies any kind of radiation of the pain, to an arm, leg, or any kind of look localized weakness on one side the body.  She denies any black or bloody stools.  No current abdominal pain.  Denies any kind of nausea, vomiting.    Home Medications Prior to Admission medications   Medication Sig Start Date End Date Taking? Authorizing Provider  acetaminophen (TYLENOL) 650 MG CR tablet Take 650 mg by mouth every 8 (eight) hours as needed (Discomfort).    [provider]  alendronate (FOSAMAX) 70 MG tablet Take 70 mg by mouth every Sunday. 07/13/17   [provider]  apixaban (ELIQUIS) 5 MG TABS tablet Take 5 mg by mouth 2 (two) times daily.    [provider]  atorvastatin (LIPITOR) 40 MG tablet Take 40 mg by mouth daily.    [provider]  Benzethonium Chloride 0.13 % LIQD Apply 1 Application topically 2 (two) times a week. On Tuesdays and Fridays    [provider]  budesonide-formoterol (SYMBICORT) 160-4.5 MCG/ACT inhaler Inhale 2 puffs into the lungs 2 (two) times daily.    [provider]  carvedilol (COREG) 3.125 MG tablet Take 1 tablet (3.125 mg total) by mouth 2 (two) times daily. 06/23/22   Jonita Albee, PA-C  Emollient (AQUAPHOR ADV THERAPY HEALING) 41 % OINT Apply 1 Application topically See admin instructions. Apply 1 application topically  every shift, apply to sacrum wound.    [provider]  Ferrous Sulfate (IRON) 325 (65 Fe) MG TABS Take 325 mg by mouth daily. 08/13/17   [provider]  FLUoxetine (PROZAC) 40 MG capsule Take 40 mg by mouth daily.     [provider]  gabapentin (NEURONTIN) 300 MG capsule Take 600 mg by mouth at bedtime.    [provider]  guaiFENesin (MUCINEX) 600 MG 12 hr tablet Take 600 mg by mouth 2 (two) times daily.    [provider]  HUMALOG KWIKPEN 100 UNIT/ML KwikPen Inject into the skin 2 (two) times daily. Sliding scale 03/11/23   [provider]  hydrALAZINE (APRESOLINE) 25 MG tablet Take 1 tablet (25 mg total) by mouth every 8 (eight) hours. 04/23/23   Elberta Fortis, MD  ipratropium (ATROVENT) 0.06 % nasal spray Place 2 sprays into both nostrils 3 (three) times daily. Patient taking differently: Place 2 sprays into both nostrils every 8 (eight) hours as needed for rhinitis. 12/14/21   Charlott Holler, MD  isosorbide mononitrate (IMDUR) 30 MG 24 hr tablet Take 0.5 tablets (15 mg total) by mouth daily. 04/23/23   Elberta Fortis, MD  LANTUS SOLOSTAR 100 UNIT/ML Solostar Pen Inject 6 Units into the skin at bedtime. 04/23/23   Elberta Fortis, MD  levocetirizine (XYZAL) 5 MG tablet Take 5 mg by mouth at bedtime. Patient not taking: Reported on  04/04/2023 09/05/21   [provider]  levothyroxine (SYNTHROID) 75 MCG tablet Take 75 mcg by mouth daily. 03/22/23   [provider]  magnesium oxide (MAG-OX) 400 (240 Mg) MG tablet Take 400 mg by mouth daily.    [provider]  nystatin ointment (MYCOSTATIN) Apply 1 application  topically daily as needed (rash). 100,000- units/gram    [provider]  pantoprazole (PROTONIX) 40 MG tablet Take 40 mg by mouth 2 (two) times daily. 03/26/23   [provider]  PROAIR RESPICLICK 108 (90 Base) MCG/ACT AEPB Inhale 2 puffs into the lungs 4 (four) times daily as needed.  02/19/23   [provider]  sodium chloride (OCEAN) 0.65 % SOLN nasal spray Place 2 sprays into both nostrils every 6 (six) hours as needed for congestion.    [provider]  sucralfate (CARAFATE) 1 g tablet Take 1 g by mouth 4 (four) times daily -  with meals and at bedtime.    [provider]  torsemide 60 MG TABS Take 60 mg by mouth 2 (two) times daily. 04/23/23   Elberta Fortis, MD  traZODone (DESYREL) 50 MG tablet Take 0.5 tablets (25 mg total) by mouth at bedtime. 04/23/23   Elberta Fortis, MD      Allergies    Patient has no known allergies.    Review of Systems   Review of Systems  Neurological:  Positive for weakness.    Physical Exam Updated Vital Signs BP 129/63   Pulse 64   Temp 98.7 F (37.1 C) (Oral)   Resp 18   Ht 5' (1.524 m)   Wt 96 kg   SpO2 100%   BMI 41.33 kg/m  Physical Exam Vitals and nursing note reviewed. Exam conducted with a chaperone present.  Constitutional:      General: She is not in acute distress.    Appearance: She is well-developed.  HENT:     Head: Normocephalic and atraumatic.  Eyes:     Conjunctiva/sclera: Conjunctivae normal.  Cardiovascular:     Rate and Rhythm: Normal rate and regular rhythm.     Heart sounds: No murmur heard. Pulmonary:     Effort: Pulmonary effort is normal. No respiratory distress.     Comments: +crackles throughout Chest:     Comments: No ecchymoses or wounds Abdominal:     Palpations: Abdomen is soft.     Tenderness: There is no abdominal tenderness.     Comments: No ecchymoses or wounds  Genitourinary:    Rectum: Guaiac result positive. No external hemorrhoid or internal hemorrhoid.  Musculoskeletal:        General: No swelling.     Cervical back: Neck supple.  Skin:    General: Skin is warm and dry.     Capillary Refill: Capillary refill takes less than 2 seconds.  Neurological:     Mental Status: She is alert.  Psychiatric:        Mood and Affect: Mood normal.      ED Results / Procedures / Treatments   Labs (all labs ordered are listed, but only abnormal results are displayed) Labs Reviewed  COMPREHENSIVE METABOLIC PANEL - Abnormal; Notable for the following components:      Result Value   BUN 71 (*)    Creatinine, Ser 2.05 (*)    Total Protein 6.0 (*)    Albumin 2.8 (*)    Total Bilirubin 1.2 (*)    GFR, Estimated 25 (*)    All other components  within normal limits  CBC WITH DIFFERENTIAL/PLATELET - Abnormal; Notable for the following components:   RBC 2.53 (*)    Hemoglobin 7.4 (*)    HCT 25.3 (*)    MCHC 29.2 (*)    RDW 17.3 (*)    Abs Immature Granulocytes 0.09 (*)    All other components within normal limits  PROTIME-INR - Abnormal; Notable for the following components:   Prothrombin Time 21.6 (*)    INR 1.9 (*)    All other components within normal limits  I-STAT CHEM 8, ED - Abnormal; Notable for the following components:   BUN 71 (*)    Creatinine, Ser 2.20 (*)    Calcium, Ion 1.12 (*)    Hemoglobin 7.5 (*)    HCT 22.0 (*)    All other components within normal limits  POC OCCULT BLOOD, ED - Abnormal; Notable for the following components:   Fecal Occult Bld POSITIVE (*)    All other components within normal limits  TROPONIN I (HIGH SENSITIVITY) - Abnormal; Notable for the following components:   Troponin I (High Sensitivity) 57 (*)    All other components within normal limits  URINALYSIS, ROUTINE W REFLEX MICROSCOPIC  OCCULT BLOOD X 1 CARD TO LAB, STOOL  TYPE AND SCREEN  TROPONIN I (HIGH SENSITIVITY)    EKG EKG Interpretation Date/Time:  Tuesday April 30 2023 19:42:03 EST Ventricular Rate:  66 PR Interval:  66 QRS Duration:  122 QT Interval:  486 QTC Calculation: 510 R Axis:   -59  Text Interpretation: Ventricular-paced rhythm No further analysis attempted due to paced rhythm Diffuse TW abnormality Confirmed by Alvira Monday ([id number]) on 04/30/2023 9:50:13 PM  Radiology CT CHEST ABDOMEN PELVIS WO  CONTRAST Result Date: 04/30/2023 CLINICAL DATA:  Chest pain.  Back pain.  Hemoglobin drop. EXAM: CT CHEST, ABDOMEN AND PELVIS WITHOUT CONTRAST TECHNIQUE: Multidetector CT imaging of the chest, abdomen and pelvis was performed following the standard protocol without IV contrast. RADIATION DOSE REDUCTION: This exam was performed according to the departmental dose-optimization program which includes automated exposure control, adjustment of the mA and/or kV according to patient size and/or use of iterative reconstruction technique. COMPARISON:  CT 07/06/2021 FINDINGS: CT CHEST FINDINGS Cardiovascular: Left-sided pacemaker in place. Heart is normal in size. Coronary artery calcifications. Prosthetic aortic valve. Advanced aortic atherosclerosis. Dilated descending aorta measuring 3.7 cm post median sternotomy. No pericardial effusion. Decreased density of the blood pool suggestive of anemia. Mediastinum/Nodes: Mildly enlarged and borderline mediastinal lymph nodes, including a 14 mm anterior paratracheal node series 2, image 20. Hilar assessment is limited in the absence of IV contrast. Eeva Schlosser to moderate-sized hiatal hernia. Lungs/Pleura: Moderate to advanced multifocal lung opacities with ground-glass and nodular configuration. Airspace disease obscures the area of previous left lower lobe nodule. Toula Miyasaki left and Dorinne Graeff to moderate right pleural effusion, simple fluid density. Consolidation in the dependent right lower lobe may represent compressive atelectasis or infection. There is moderate right middle and lower lobe bronchial thickening. No endobronchial lesion. Musculoskeletal: Moderate thoracic spondylosis with diffuse anterior spurring. Prior median sternotomy. There are no acute or suspicious osseous abnormalities. CT ABDOMEN PELVIS FINDINGS Hepatobiliary: Unremarkable unenhanced appearance of the liver. Clips in the gallbladder fossa postcholecystectomy. No biliary dilatation. Pancreas: Parenchymal atrophy. No  ductal dilatation or inflammation. Spleen: Normal in size without focal abnormality. Adrenals/Urinary Tract: No adrenal nodule. No hydronephrosis. No renal calculi. No evidence of focal renal abnormality. Decompressed ureters. Nondistended urinary bladder. Stomach/Bowel: Postsurgical change in the greater curvature of the stomach moderate-sized  hiatal hernia. The appendix is not definitively seen. No evidence of bowel wall thickening, distention, or inflammatory changes. Lilyanah Celestin to moderate volume of colonic stool. Vascular/Lymphatic: Advanced aortic and branch atherosclerosis. Retroaortic left renal vein. No evidence of retroperitoneal hematoma. No adenopathy. Reproductive: Uterus and bilateral adnexa are unremarkable. Other: Broad-based rectus diastasis. Elfrieda Espino fat containing umbilical hernia. 2.3 x 1.2 cm hyperdensity in the right lateral abdominal wall musculature, series 2, image 83. This is new from prior exam. Scattered gluteal granulomas are again seen. No significant ascites. No free air. Musculoskeletal: Moderate diffuse lumbar degenerative change. Scattered Schmorl's nodes. Chronic compression deformity of L5. Undulation of inferior L2 vertebral body is new from prior. IMPRESSION: 1. Moderate to advanced multifocal lung opacities with ground-glass and nodular configuration, suspicious for multifocal pneumonia. Airspace nodularity obscures the previous left lower lobe pulmonary nodule. 2. Desiree Fleming left and Umaiza Matusik to moderate right pleural effusions. Consolidation in the dependent right lower lobe may represent compressive atelectasis or infection. 3. Mildly enlarged and borderline mediastinal lymph nodes, likely reactive. 4. Undulation of inferior L2 vertebral body is new from prior exam, suspicious for subacute or acute compression fracture. Recommend correlation with focal tenderness. 5. A 2.3 x 1.2 cm hyperdensity in the right lateral abdominal wall musculature is new from prior exam, suspicious for Fuad Forget  intramuscular abdominal wall hematoma. Aortic Atherosclerosis (ICD10-I70.0). Electronically Signed   By: Narda Rutherford M.D.   On: 04/30/2023 22:45    Procedures Procedures    Medications Ordered in ED Medications  cefTRIAXone (ROCEPHIN) 1 g in sodium chloride 0.9 % 100 mL IVPB (1 g Intravenous New Bag/Given 04/30/23 2312)  azithromycin (ZITHROMAX) 500 mg in sodium chloride 0.9 % 250 mL IVPB (has no administration in time range)    ED Course/ Medical Decision Making/ A&P                                 Medical Decision Making Patient is a 77 year old female, history of cognitive impairment, who presents to the ED secondary to chest pain, low back pain, as well as increased weakness.  She has crackles on exam, as well as concern for GI bleed, given the new anemia.  Performed a rectal exam and there was melena.  Given her kidney function, we will do CT chest, abdomen pelvis, for further evaluation.  Additionally we will to obtain troponins.  Amount and/or Complexity of Data Reviewed Labs: ordered.    Details: Troponin mildly elevated, 57, positive Hemoccult, hemoglobin of 7.5 Radiology: ordered.    Details: CT chest, abdomen, pelvis, shows multifocal pneumonia, as well as some bilateral pleural effusions, as well as a Elai Vanwyk right-sided interval vascular abdominal wall hematoma, Discussion of management or test interpretation with external provider(s): Patient is a 77 year old female, found to have multifocal pneumonia, started on ceftriaxone and azithromycin, she is on her 4 L of O2, which she is at home, will admit to hospitalist for multifocal pneumonia.  Additionally found to have positive Hemoccult, concerning for melena, concern for GI bleed, given new onset of anemia, previously hemoglobin was in the nines, now 7.5.  Will admit to hospitalist, for trending of hemoglobin, especially d/t Eliquis use and GI consultation in AM.  Also found to have intra muscular, abdominal wall, hematoma.  May be the reason for slight drop in hemoglobin. Found to have compression fracture, TLSO brace ordered, Ok'd by neurosurgery PA, Esperanza Richters.  Handed off [facility] PA, pending admission.  Started on  IV PPI, per request of GI, I consulted them, they will see them in the a.m.  Risk Prescription drug management. Decision regarding hospitalization.    Final Clinical Impression(s) / ED Diagnoses Final diagnoses:  Multifocal pneumonia  Gastrointestinal hemorrhage with melena  Anemia, unspecified type  Hematoma of right flank, initial encounter  Closed compression fracture of L2 vertebra, initial encounter [facility])    Rx / DC Orders ED Discharge Orders     None         Ninel Abdella, Harley [place], PA 05/01/23 0002    Alvira Monday, MD 05/02/23 1016

## 2023-04-30 NOTE — ED Triage Notes (Signed)
Pt presents via EMS c/o weakness and low hemoglobin per EMS from [facility] Pt reports back pain and buttocks pain x2 days. A&O x4. EMS reports pt c/o increased weakness interfereing with ADLs for over 1 week.

## 2023-04-30 NOTE — ED Triage Notes (Signed)
Pt on 4L 02 at home per EMS.

## 2023-05-01 ENCOUNTER — Encounter (HOSPITAL_COMMUNITY): Payer: Self-pay | Admitting: Family Medicine

## 2023-05-01 ENCOUNTER — Other Ambulatory Visit (HOSPITAL_COMMUNITY): Payer: 59

## 2023-05-01 DIAGNOSIS — K449 Diaphragmatic hernia without obstruction or gangrene: Secondary | ICD-10-CM | POA: Diagnosis not present

## 2023-05-01 DIAGNOSIS — L89312 Pressure ulcer of right buttock, stage 2: Secondary | ICD-10-CM | POA: Diagnosis present

## 2023-05-01 DIAGNOSIS — R413 Other amnesia: Secondary | ICD-10-CM

## 2023-05-01 DIAGNOSIS — D62 Acute posthemorrhagic anemia: Secondary | ICD-10-CM | POA: Diagnosis present

## 2023-05-01 DIAGNOSIS — I513 Intracardiac thrombosis, not elsewhere classified: Secondary | ICD-10-CM

## 2023-05-01 DIAGNOSIS — Z794 Long term (current) use of insulin: Secondary | ICD-10-CM

## 2023-05-01 DIAGNOSIS — J9811 Atelectasis: Secondary | ICD-10-CM | POA: Diagnosis present

## 2023-05-01 DIAGNOSIS — Z953 Presence of xenogenic heart valve: Secondary | ICD-10-CM | POA: Diagnosis not present

## 2023-05-01 DIAGNOSIS — I5043 Acute on chronic combined systolic (congestive) and diastolic (congestive) heart failure: Secondary | ICD-10-CM | POA: Diagnosis present

## 2023-05-01 DIAGNOSIS — E039 Hypothyroidism, unspecified: Secondary | ICD-10-CM | POA: Diagnosis present

## 2023-05-01 DIAGNOSIS — J440 Chronic obstructive pulmonary disease with acute lower respiratory infection: Secondary | ICD-10-CM | POA: Diagnosis present

## 2023-05-01 DIAGNOSIS — I5020 Unspecified systolic (congestive) heart failure: Secondary | ICD-10-CM | POA: Insufficient documentation

## 2023-05-01 DIAGNOSIS — K921 Melena: Secondary | ICD-10-CM | POA: Diagnosis not present

## 2023-05-01 DIAGNOSIS — I5022 Chronic systolic (congestive) heart failure: Secondary | ICD-10-CM

## 2023-05-01 DIAGNOSIS — M4856XA Collapsed vertebra, not elsewhere classified, lumbar region, initial encounter for fracture: Secondary | ICD-10-CM | POA: Diagnosis present

## 2023-05-01 DIAGNOSIS — D649 Anemia, unspecified: Secondary | ICD-10-CM

## 2023-05-01 DIAGNOSIS — R9431 Abnormal electrocardiogram [ECG] [EKG]: Secondary | ICD-10-CM

## 2023-05-01 DIAGNOSIS — K264 Chronic or unspecified duodenal ulcer with hemorrhage: Secondary | ICD-10-CM | POA: Diagnosis present

## 2023-05-01 DIAGNOSIS — N179 Acute kidney failure, unspecified: Secondary | ICD-10-CM | POA: Diagnosis present

## 2023-05-01 DIAGNOSIS — S32000A Wedge compression fracture of unspecified lumbar vertebra, initial encounter for closed fracture: Secondary | ICD-10-CM | POA: Insufficient documentation

## 2023-05-01 DIAGNOSIS — I2781 Cor pulmonale (chronic): Secondary | ICD-10-CM | POA: Diagnosis present

## 2023-05-01 DIAGNOSIS — E8809 Other disorders of plasma-protein metabolism, not elsewhere classified: Secondary | ICD-10-CM | POA: Diagnosis present

## 2023-05-01 DIAGNOSIS — R531 Weakness: Secondary | ICD-10-CM | POA: Diagnosis present

## 2023-05-01 DIAGNOSIS — D631 Anemia in chronic kidney disease: Secondary | ICD-10-CM | POA: Diagnosis present

## 2023-05-01 DIAGNOSIS — J9621 Acute and chronic respiratory failure with hypoxia: Secondary | ICD-10-CM

## 2023-05-01 DIAGNOSIS — J189 Pneumonia, unspecified organism: Secondary | ICD-10-CM

## 2023-05-01 DIAGNOSIS — I130 Hypertensive heart and chronic kidney disease with heart failure and stage 1 through stage 4 chronic kidney disease, or unspecified chronic kidney disease: Secondary | ICD-10-CM | POA: Diagnosis present

## 2023-05-01 DIAGNOSIS — K31811 Angiodysplasia of stomach and duodenum with bleeding: Secondary | ICD-10-CM | POA: Diagnosis present

## 2023-05-01 DIAGNOSIS — K31819 Angiodysplasia of stomach and duodenum without bleeding: Secondary | ICD-10-CM | POA: Diagnosis not present

## 2023-05-01 DIAGNOSIS — Z515 Encounter for palliative care: Secondary | ICD-10-CM | POA: Diagnosis not present

## 2023-05-01 DIAGNOSIS — R195 Other fecal abnormalities: Secondary | ICD-10-CM

## 2023-05-01 DIAGNOSIS — L89322 Pressure ulcer of left buttock, stage 2: Secondary | ICD-10-CM | POA: Diagnosis present

## 2023-05-01 DIAGNOSIS — E119 Type 2 diabetes mellitus without complications: Secondary | ICD-10-CM

## 2023-05-01 DIAGNOSIS — N184 Chronic kidney disease, stage 4 (severe): Secondary | ICD-10-CM | POA: Diagnosis present

## 2023-05-01 DIAGNOSIS — E1122 Type 2 diabetes mellitus with diabetic chronic kidney disease: Secondary | ICD-10-CM | POA: Diagnosis present

## 2023-05-01 DIAGNOSIS — Z66 Do not resuscitate: Secondary | ICD-10-CM | POA: Diagnosis present

## 2023-05-01 DIAGNOSIS — I4892 Unspecified atrial flutter: Secondary | ICD-10-CM | POA: Diagnosis present

## 2023-05-01 DIAGNOSIS — Z6841 Body Mass Index (BMI) 40.0 and over, adult: Secondary | ICD-10-CM | POA: Diagnosis not present

## 2023-05-01 DIAGNOSIS — F32A Depression, unspecified: Secondary | ICD-10-CM | POA: Diagnosis present

## 2023-05-01 LAB — PROCALCITONIN: Procalcitonin: 0.15 ng/mL

## 2023-05-01 LAB — CBG MONITORING, ED
Glucose-Capillary: 161 mg/dL — ABNORMAL HIGH (ref 70–99)
Glucose-Capillary: 150 mg/dL — ABNORMAL HIGH (ref 70–99)
Glucose-Capillary: 153 mg/dL — ABNORMAL HIGH (ref 70–99)
Glucose-Capillary: 157 mg/dL — ABNORMAL HIGH (ref 70–99)

## 2023-05-01 LAB — BASIC METABOLIC PANEL
Anion gap: 10 (ref 5–15)
BUN: 72 mg/dL — ABNORMAL HIGH (ref 8–23)
CO2: 25 mmol/L (ref 22–32)
Calcium: 8.2 mg/dL — ABNORMAL LOW (ref 8.9–10.3)
Chloride: 103 mmol/L (ref 98–111)
Creatinine, Ser: 2.04 mg/dL — ABNORMAL HIGH (ref 0.44–1.00)
GFR, Estimated: 25 mL/min — ABNORMAL LOW (ref >60)
Glucose, Bld: 178 mg/dL — ABNORMAL HIGH (ref 70–99)
Potassium: 4.5 mmol/L (ref 3.5–5.1)
Sodium: 138 mmol/L (ref 135–145)

## 2023-05-01 LAB — HEMOGLOBIN AND HEMATOCRIT, BLOOD
HCT: 22.9 % — ABNORMAL LOW (ref 36.0–46.0)
HCT: 31.6 % — ABNORMAL LOW (ref 36.0–46.0)
Hemoglobin: 6.9 g/dL — CL (ref 12.0–15.0)
Hemoglobin: 9.9 g/dL — ABNORMAL LOW (ref 12.0–15.0)

## 2023-05-01 LAB — MAGNESIUM: Magnesium: 2 mg/dL (ref 1.7–2.4)

## 2023-05-01 LAB — FERRITIN: Ferritin: 523 ng/mL — ABNORMAL HIGH (ref 11–307)

## 2023-05-01 LAB — GLUCOSE, CAPILLARY: Glucose-Capillary: 174 mg/dL — ABNORMAL HIGH (ref 70–99)

## 2023-05-01 LAB — BRAIN NATRIURETIC PEPTIDE: B Natriuretic Peptide: 697 pg/mL — ABNORMAL HIGH (ref 0.0–100.0)

## 2023-05-01 LAB — PREPARE RBC (CROSSMATCH)

## 2023-05-01 LAB — CK: Total CK: 18 U/L — ABNORMAL LOW (ref 38–234)

## 2023-05-01 LAB — C-REACTIVE PROTEIN: CRP: 12.1 mg/dL — ABNORMAL HIGH (ref <1.0)

## 2023-05-01 LAB — SEDIMENTATION RATE: Sed Rate: 76 mm/h — ABNORMAL HIGH (ref 0–22)

## 2023-05-01 LAB — TROPONIN I (HIGH SENSITIVITY): Troponin I (High Sensitivity): 52 ng/L — ABNORMAL HIGH (ref <18)

## 2023-05-01 MED ORDER — IPRATROPIUM BROMIDE 0.06 % NA SOLN
2.0000 | Freq: Three times a day (TID) | NASAL | Status: DC
Start: 2023-05-01 — End: 2023-05-05
  Administered 2023-05-01 – 2023-05-05 (×13): 2 via NASAL
  Filled 2023-05-01 (×2): qty 15

## 2023-05-01 MED ORDER — HYDRALAZINE HCL 25 MG PO TABS
25.0000 mg | ORAL_TABLET | Freq: Three times a day (TID) | ORAL | Status: DC
Start: 2023-05-01 — End: 2023-05-05
  Administered 2023-05-01 – 2023-05-05 (×13): 25 mg via ORAL
  Filled 2023-05-01 (×13): qty 1

## 2023-05-01 MED ORDER — FUROSEMIDE 10 MG/ML IJ SOLN: 20.0000 mg | Freq: Once | INTRAMUSCULAR | Status: DC

## 2023-05-01 MED ORDER — ACETAMINOPHEN 325 MG PO TABS
650.0000 mg | ORAL_TABLET | Freq: Four times a day (QID) | ORAL | Status: DC
  Administered 2023-05-01 – 2023-05-05 (×15): 650 mg via ORAL
  Filled 2023-05-01 (×16): qty 2

## 2023-05-01 MED ORDER — FUROSEMIDE 10 MG/ML IJ SOLN: 40.0000 mg | Freq: Once | INTRAMUSCULAR | Status: DC

## 2023-05-01 MED ORDER — GUAIFENESIN ER 600 MG PO TB12
600.0000 mg | ORAL_TABLET | Freq: Two times a day (BID) | ORAL | Status: DC
  Administered 2023-05-01 – 2023-05-05 (×9): 600 mg via ORAL
  Filled 2023-05-01 (×9): qty 1

## 2023-05-01 MED ORDER — CALCITONIN (SALMON) 200 UNIT/ACT NA SOLN
1.0000 | Freq: Every day | NASAL | Status: DC
Start: 2023-05-01 — End: 2023-05-02
  Filled 2023-05-01: qty 3.7

## 2023-05-01 MED ORDER — SODIUM CHLORIDE 0.9 % IV SOLN
500.0000 mg | INTRAVENOUS | Status: AC
  Administered 2023-05-01 – 2023-05-03 (×2): 500 mg via INTRAVENOUS
  Filled 2023-05-01 (×2): qty 5

## 2023-05-01 MED ORDER — PANTOPRAZOLE SODIUM 40 MG IV SOLR
40.0000 mg | Freq: Two times a day (BID) | INTRAVENOUS | Status: DC
  Administered 2023-05-01 – 2023-05-03 (×5): 40 mg via INTRAVENOUS
  Filled 2023-05-01 (×5): qty 10

## 2023-05-01 MED ORDER — CARVEDILOL 3.125 MG PO TABS
3.1250 mg | ORAL_TABLET | Freq: Two times a day (BID) | ORAL | Status: DC
  Administered 2023-05-01 – 2023-05-05 (×9): 3.125 mg via ORAL
  Filled 2023-05-01 (×9): qty 1

## 2023-05-01 MED ORDER — ACETAMINOPHEN 650 MG RE SUPP: 650.0000 mg | Freq: Four times a day (QID) | RECTAL | Status: DC | PRN

## 2023-05-01 MED ORDER — SUCRALFATE 1 G PO TABS
1.0000 g | ORAL_TABLET | Freq: Three times a day (TID) | ORAL | Status: DC
  Administered 2023-05-01 – 2023-05-05 (×15): 1 g via ORAL
  Filled 2023-05-01 (×15): qty 1

## 2023-05-01 MED ORDER — SODIUM CHLORIDE 0.9% IV SOLUTION: Freq: Once | INTRAVENOUS | Status: AC

## 2023-05-01 MED ORDER — FLUOXETINE HCL 20 MG PO CAPS
40.0000 mg | ORAL_CAPSULE | Freq: Every day | ORAL | Status: DC
  Administered 2023-05-01 – 2023-05-05 (×5): 40 mg via ORAL
  Filled 2023-05-01 (×5): qty 2

## 2023-05-01 MED ORDER — ATORVASTATIN CALCIUM 40 MG PO TABS
40.0000 mg | ORAL_TABLET | Freq: Every day | ORAL | Status: DC
Start: 2023-05-01 — End: 2023-05-05
  Administered 2023-05-01 – 2023-05-05 (×5): 40 mg via ORAL
  Filled 2023-05-01 (×5): qty 1

## 2023-05-01 MED ORDER — INSULIN ASPART 100 UNIT/ML IJ SOLN: 0.0000 [IU] | Freq: Three times a day (TID) | INTRAMUSCULAR | Status: DC

## 2023-05-01 MED ORDER — LEVOTHYROXINE SODIUM 75 MCG PO TABS
75.0000 ug | ORAL_TABLET | Freq: Every day | ORAL | Status: DC
  Administered 2023-05-01 – 2023-05-05 (×5): 75 ug via ORAL
  Filled 2023-05-01 (×5): qty 1

## 2023-05-01 MED ORDER — MOMETASONE FURO-FORMOTEROL FUM 200-5 MCG/ACT IN AERO
2.0000 | INHALATION_SPRAY | Freq: Two times a day (BID) | RESPIRATORY_TRACT | Status: DC
  Administered 2023-05-01 – 2023-05-05 (×9): 2 via RESPIRATORY_TRACT
  Filled 2023-05-01: qty 8.8

## 2023-05-01 MED ORDER — PANTOPRAZOLE SODIUM 40 MG IV SOLR
40.0000 mg | Freq: Once | INTRAVENOUS | Status: AC
  Administered 2023-05-01: 40 mg via INTRAVENOUS
  Filled 2023-05-01: qty 10

## 2023-05-01 MED ORDER — SODIUM CHLORIDE 0.9 % IV SOLN
2.0000 g | INTRAVENOUS | Status: AC
  Administered 2023-05-01 – 2023-05-04 (×4): 2 g via INTRAVENOUS
  Filled 2023-05-01 (×4): qty 20

## 2023-05-01 MED ORDER — IPRATROPIUM-ALBUTEROL 0.5-2.5 (3) MG/3ML IN SOLN
3.0000 mL | Freq: Four times a day (QID) | RESPIRATORY_TRACT | Status: DC | PRN
  Administered 2023-05-01: 3 mL via RESPIRATORY_TRACT
  Filled 2023-05-01: qty 3

## 2023-05-01 MED ORDER — GABAPENTIN 300 MG PO CAPS
600.0000 mg | ORAL_CAPSULE | Freq: Every day | ORAL | Status: DC
  Administered 2023-05-01 – 2023-05-04 (×4): 600 mg via ORAL
  Filled 2023-05-01 (×4): qty 2

## 2023-05-01 MED ORDER — INSULIN ASPART 100 UNIT/ML IJ SOLN
0.0000 [IU] | Freq: Four times a day (QID) | INTRAMUSCULAR | Status: DC
Start: 2023-05-01 — End: 2023-05-01
  Administered 2023-05-01 (×2): 2 [IU] via SUBCUTANEOUS

## 2023-05-01 MED ORDER — FUROSEMIDE 10 MG/ML IJ SOLN
60.0000 mg | Freq: Once | INTRAMUSCULAR | Status: AC
  Administered 2023-05-01: 60 mg via INTRAVENOUS
  Filled 2023-05-01: qty 6

## 2023-05-01 MED ORDER — VITAMIN D 25 MCG (1000 UNIT) PO TABS
1000.0000 [IU] | ORAL_TABLET | Freq: Every day | ORAL | Status: DC
Start: 2023-05-01 — End: 2023-05-05
  Administered 2023-05-01 – 2023-05-05 (×5): 1000 [IU] via ORAL
  Filled 2023-05-01 (×5): qty 1

## 2023-05-01 MED ORDER — ISOSORBIDE MONONITRATE ER 30 MG PO TB24
15.0000 mg | ORAL_TABLET | Freq: Every day | ORAL | Status: DC
Start: 2023-05-01 — End: 2023-05-05
  Administered 2023-05-01 – 2023-05-05 (×5): 15 mg via ORAL
  Filled 2023-05-01 (×5): qty 1

## 2023-05-01 MED ORDER — ACETAMINOPHEN 325 MG PO TABS: 650.0000 mg | ORAL_TABLET | Freq: Four times a day (QID) | ORAL | Status: DC | PRN

## 2023-05-01 MED ORDER — INSULIN ASPART 100 UNIT/ML IJ SOLN
0.0000 [IU] | Freq: Three times a day (TID) | INTRAMUSCULAR | Status: DC
  Administered 2023-05-02: 2 [IU] via SUBCUTANEOUS
  Administered 2023-05-02: 1 [IU] via SUBCUTANEOUS
  Administered 2023-05-02 – 2023-05-03 (×3): 2 [IU] via SUBCUTANEOUS
  Administered 2023-05-04 (×2): 1 [IU] via SUBCUTANEOUS
  Administered 2023-05-04: 2 [IU] via SUBCUTANEOUS
  Administered 2023-05-05: 3 [IU] via SUBCUTANEOUS
  Administered 2023-05-05: 2 [IU] via SUBCUTANEOUS

## 2023-05-01 MED ORDER — SODIUM CHLORIDE 0.9 % IV SOLN: INTRAVENOUS | Status: DC | PRN

## 2023-05-01 MED ORDER — TRAZODONE HCL 50 MG PO TABS
25.0000 mg | ORAL_TABLET | Freq: Every day | ORAL | Status: DC
  Administered 2023-05-01 – 2023-05-04 (×4): 25 mg via ORAL
  Filled 2023-05-01 (×4): qty 1

## 2023-05-01 NOTE — Assessment & Plan Note (Addendum)
Hgb 6.9 on admission, patient is now receiving 2 units pRBCs. FOBT positive. Concern for GI bleed. Will consult GI -f/u post-transfusion H&H - will hold eliqius in setting of bleed - continue IV PPI BID - keep NPO pending GI recs  -Transfusion threshold Hgb<8 given A fib

## 2023-05-01 NOTE — Assessment & Plan Note (Signed)
QTcB 510 and receiving pantoprazole 40 mg IV BID.  However, not on any other QT prolonging meds for pantoprazole to interact with currently and benefits outweigh risks in setting of suspected UGIB. -f/u repeat EKG -Avoid QT prolonging meds

## 2023-05-01 NOTE — Plan of Care (Signed)
FMTS Brief Progress Note Phone Call with Neurosurgery  Spoke with on-call Neurosurgeon Dr. Marcelyn Ditty, about the compression fracture that patient has. I let him know that it was found incidentally and that patient was not complaining about pain from it initially. He noted that the compression fracture looked old, and that there was nothing more to do. He said that he didn't eve think that she needed to wear the brace, as it would not likely do anything. I asked if there was any need for intranasal calcitonin and he said there was not.   Provider thanked Dr. Chancy Milroy for his help.  Bess Kinds, MD 05/01/2023, 1:56 PM PGY-3, [facility] Night Resident  Please page [phone] with questions.

## 2023-05-01 NOTE — ED Notes (Signed)
Pt told me she pee'd a bunch. We changed brief which was in fact very wet and there was also stool present.    After changing her we placed a purewick

## 2023-05-01 NOTE — Plan of Care (Signed)
FMTS Interim Progress Note  S: To bedside after RN message that patient was feeling SOB. Pt reports some worsening shortness of breath compared to yesterday; denies chest pain. Still on 2L.  O: BP (!) 155/45 (BP Location: Right Wrist)   Pulse 66   Temp 98.1 F (36.7 C) (Oral)   Resp 20   Ht 5' (1.524 m)   Wt 96 kg   SpO2 97%   BMI 41.33 kg/m    Gen: NAD, pleasant and communicative CV: RRR, JVP not appreciated Respiratory: slightly increased work of breathing but no overt respiratory distress. Diffuse wheezing and rhonchi bilaterally.  Ext: trace b/l lower extremity edema  A/P:  Shortness of breath Stable on 2L O2 Wheezing on exam In setting of COPD and multifocal pneumonia CHF but does not appear acutely hypervolemic  -Duoneb treatment for wheezing (respiratory paged by RN, appreciate their care) -if persistent or worsening after treatment, consider diuresis +/- CXR  Vonna Drafts, MD 05/01/2023, 11:56 PM PGY-2, [facility] Service pager [phone]

## 2023-05-01 NOTE — Plan of Care (Addendum)
FMTS Interim Progress Note  S: In to evaluate fluid status of patient. Patient experiencing some shortness of breath, did not have cannula on as she was getting inhaler.  O: BP (!) 148/132   Pulse 61   Temp (!) 97.5 F (36.4 C) (Oral)   Resp 17   Ht 5' (1.524 m)   Wt 96 kg   SpO2 99%   BMI 41.33 kg/m   General: alert, resting in bed comfortably, brace in place  Cardiac: RRR, prosthetic valve murmur  Respiratory: CTAB, 2L [place] in place  GI: Soft, NTTP MSK: no peripheral edema noted   A/P: CHF exacerbation: - s/p 49m IV lasix - cardiology following, awaiting recs from cardiology team re further diuresis  - continue to wean O2 as tolerated   Rest of plan per progress note from today  Penne Lash, MD 05/01/2023, 5:14 PM PGY-1, [facility] pager [phone]

## 2023-05-01 NOTE — Assessment & Plan Note (Addendum)
Suspect acute fracture d/t osteoporosis in setting of physical therapy.  Currently wearing TLSO brace and pain is minimal.Will reach out to neurosurgery considering patient denies any trauma. - neurosurgery consulted, appreciate recs - Will add calcitonin spray daily  - continue tylenol 650 m q5h  -Nursing order for activity as tolerated with brace -PT/OT eval and treat

## 2023-05-01 NOTE — ED Notes (Signed)
Pt states the only discomfort she has in her back is from the brace still being applied. Brace removed with permission from Dr. Georg Ruddle

## 2023-05-01 NOTE — Progress Notes (Signed)
Patient states that she does not wear bipap, no machine in room at this time.

## 2023-05-01 NOTE — Assessment & Plan Note (Signed)
Creatinine improved from discharge 12/11, but above former baseline ~1.5-1.7.  Possibly prerenal given BUN/Cr ratio >20. -Still holding Farxiga from last admission, was not restarted outpatient -Use caution with fluids given HFrEF -Avoid nephrotoxic medications, caution with contrast

## 2023-05-01 NOTE — Assessment & Plan Note (Addendum)
Will hold Eliquis 5 mg BID given suspected UGIB, restart per GI recs. -Follow up TTE to evaluate status of LV apical thrombus previously noted on TTE 11/28 -Consult Cardiology in AM -AM BMP; goal K>4, mag>2

## 2023-05-01 NOTE — Assessment & Plan Note (Addendum)
CT chest concerning for multifocal pneumonia. Patient was recently admitted and treated for HAP. Will discuss with pulmonology regarding multi pneumonia vs COPD vs other cause.  - pulmonology consult, appreciate recs . - will continue CTX 1 g (day 1 of 5) and azithromycin 500 mg (day 1 of 3) pending pulm recs

## 2023-05-01 NOTE — Progress Notes (Signed)
FMTS Interim Progress Note  S: Patient seen in ED. In brace. Denies any complaints at this time.   O: BP (!) 149/77   Pulse 79   Temp 99.3 F (37.4 C) (Oral)   Resp (!) 21   Ht 5' (1.524 m)   Wt 96 kg   SpO2 96%   BMI 41.33 kg/m   General: alert to name and place, NAD  HEENT: No sign of trauma, EOM grossly intact. Does have brace in place Cardiac: paced ryhtmn, prosthetic valve murmur Respiratory: mild crackles at lower lung bases, NWOB on 2L [place]  GI: Brace in place, able to palpate above, does not endorse tenderness  Psych: pleasant    A/P: Assessment & Plan Symptomatic anemia Hgb 6.9 on admission, patient is now receiving 2 units pRBCs. FOBT positive. Concern for GI bleed. Will consult GI -f/u post-transfusion H&H - will hold eliqius in setting of bleed - continue IV PPI BID - keep NPO pending GI recs  -Transfusion threshold Hgb<8 given A fib   Multifocal pneumonia CT chest concerning for multifocal pneumonia. Patient was recently admitted and treated for HAP. Will discuss with pulmonology regarding multi pneumonia vs COPD vs other cause.  - pulmonology consult, appreciate recs . - will continue CTX 1 g (day 1 of 5) and azithromycin 500 mg (day 1 of 3) pending pulm recs   HFrEF (heart failure with reduced ejection fraction) (HCC) Patient with recent ECHO showed EF 35-40%, discharged on torsemide 60 mg BID.  BNP elevated. Will treat for HF exacerbation with IV diuresis. Given patient has been complaint with medication and seems to be worse since last admission just a week ago, concerning for worsening of heart failure.  - 60 mg IV diuresis, will reassess after blood transfusion and consider redosing. Patient also showing some EKG changes from baseline- including increased amplitude of T waves.  - cardiology following, appreciate recs  -Use caution with fluids  -Strict I&Os, daily weights -Continue carvedilol 3.125 mg BID  Lumbar compression fracture (HCC) Suspect acute  fracture d/t osteoporosis in setting of physical therapy.  Currently wearing TLSO brace and pain is minimal.Will reach out to neurosurgery considering patient denies any trauma. - neurosurgery consulted, appreciate recs - Will add calcitonin spray daily  - continue tylenol 650 m q5h  -Nursing order for activity as tolerated with brace -PT/OT eval and treat  Paroxysmal atrial flutter (HCC) Will hold Eliquis 5 mg BID given suspected UGIB, restart per GI recs. -Follow up TTE to evaluate status of LV apical thrombus previously noted on TTE 11/28 -Consult Cardiology in AM -AM BMP; goal K>4, mag>2  Chronic and stable conditions: A fib: will hold eliquis in setting of suspected bleed. Will consider TEE as patient had LV thrombus on TTE on 11/28. Cardiology following.  DMII: CBGs q6h, sensitive SSI   Tonya Berkovich, MD 05/01/2023, 8:11 AM PGY-1, [facility] pager [phone]

## 2023-05-01 NOTE — Progress Notes (Signed)
PT Cancellation Note  Patient Details Name: Tonya Glass MRN: [id number] DOB: June 10, 1945   Cancelled Treatment:    Reason Eval/Treat Not Completed: Patient at procedure or test/unavailable;Medical issues which prohibited therapy (Patient getting an IV and about to start a unit of blood. PT will continue with attempts as appropriate.)  Donna Bernard, PT, MPT  [name] 05/01/2023, 10:51 AM

## 2023-05-01 NOTE — Assessment & Plan Note (Addendum)
Patient with recent ECHO showed EF 35-40%, discharged on torsemide 60 mg BID.  BNP elevated. Will treat for HF exacerbation with IV diuresis. Given patient has been complaint with medication and seems to be worse since last admission just a week ago, concerning for worsening of heart failure.  - 60 mg IV diuresis, will reassess after blood transfusion and consider redosing. Patient also showing some EKG changes from baseline- including increased amplitude of T waves.  - cardiology following, appreciate recs  -Use caution with fluids  -Strict I&Os, daily weights -Continue carvedilol 3.125 mg BID

## 2023-05-01 NOTE — Assessment & Plan Note (Signed)
Hgb 6.9 this AM on admission.  Concern for UGIB as above, though also with L2 compression fracture and small intramuscular abdominal hematoma. -Admit to FMTS, attending Dr. Miquel Dunn, Progressive unit -Transfuse 1 unit PRBC -Place 2 large bore IVs -f/u post-transfusion H&H -Transfusion threshold Hgb<8 given CAD, extensive cardiac Hx -NPO for possible endoscopy -Continue pantoprazole 40 mg IV BID and carafate 1 mg TID -Avoid NSAIDs -Already getting CTX for CAP coverage -Follow up GI recs in AM

## 2023-05-01 NOTE — Progress Notes (Signed)
Orthopedic Tech Progress Note Patient Details:  Tonya Glass 08/16/1945 [id number]  Ortho Devices Type of Ortho Device: Thoracolumbar corset (TLSO) Ortho Device/Splint Interventions: Ordered, Application, Adjustment   Post Interventions Patient Tolerated: Well Instructions Provided: Care of device, Adjustment of device  [place] A [name] 05/01/2023, 12:07 AM

## 2023-05-01 NOTE — Assessment & Plan Note (Signed)
A1c 6.0, discharged on 6 units LAI daily and SSI. -CBGs Q6h while NPO -No LAI while NPO, dose as needed based on CBGs with goal 140-180 -Sensitive SSI -Still holding Farxiga from last admission as noted above

## 2023-05-01 NOTE — Consult Note (Signed)
NAME:  Tonya Glass, MRN:  [id number], DOB:  1945-10-30, LOS: 0 ADMISSION DATE:  04/30/2023, CONSULTATION DATE:  05/01/23 REFERRING MD:  Linwood Dibbles CHIEF COMPLAINT:  Dyspnea   History of Present Illness:  Tonya Glass is a 77 y.o. female who has a PMH including but not limited to sCHF (Echo from Nov 2024 with EF 35-40%), AS s/p AVR, sick sinus syndrome, HTN, Hypothyroidism, CKD IV, DM2, COPD on 2-4L O2 (PFt's with normal function but some air trapping and hyperinflation). She presented to [facility] ED 12/18 with progressive weakness. She had recent admission 11/27 through 12/11 for what was presumed to be CHF exacerbation s/p diuresis followed by normal right heart cath. Her diuretic regimen was decreased (had been on Lasix gtt) and she was discharged on Torsemide 60mg  BID with instructions to follow up as an outpatient.  In ED, she was hypoxic at her baseline with 2-4L O2 requirement. CT C/A/P demonstrated multifocal PNA with GGO's bilaterally and small bilateral effusions and RLL consolidation. She was started on CTX/Azithro coverage. She endorsed intermittent productive cough and intermittent pleuritic left sided chest pain. Denies any recent fevers/chills/sweats, chest pain, N/V/D, abd pain, myalgias.  On prior admission, CXR showed bilateral interstitial opacities. Given CT this admission with ongoing bilateral opacities, PCCM was called to see in consultation.   Pertinent  Medical History:  has Pacemaker; Essential hypertension; Dyslipidemia; S/P aortic valve replacement with bioprosthetic valve; T2DM (type 2 diabetes mellitus) (HCC); Chronic heart failure (HCC); H/O aortic root repair; Atherosclerosis of native coronary artery of native heart without angina pectoris; SSS (sick sinus syndrome) (HCC); Diastolic murmur; Aortic valve stenosis; Hypothyroidism; Acute on chronic hypoxic respiratory failure (HCC); Acute CHF (congestive heart failure) (HCC); Acute diastolic CHF (congestive heart failure)  (HCC); CHF (congestive heart failure) (HCC); Thoracic aortic aneurysm without rupture (HCC); Nonrheumatic aortic (valve) insufficiency; Venous stasis ulcers of both lower extremities (HCC); Second degree atrioventricular block; Aortic prosthetic valve regurgitation; Paroxysmal atrial flutter (HCC); Nonrheumatic mitral valve regurgitation; Paravalvular leak (prosthetic valve); Cor pulmonale, chronic (HCC); PAH (pulmonary artery hypertension) (HCC); Peripheral venous insufficiency; Stage 3b chronic kidney disease (HCC); Mixed simple and mucopurulent chronic bronchitis (HCC); Pressure injury of skin; Acute exacerbation of CHF (congestive heart failure) (HCC); Acute kidney injury 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); Anemia; COPD (chronic obstructive pulmonary disease) (HCC); GERD (gastroesophageal reflux disease); Acute systolic heart failure (HCC); Pulmonary hypertension, unspecified (HCC); Multifocal pneumonia; SOB (shortness of breath); Symptomatic anemia; HFrEF (heart failure with reduced ejection fraction) (HCC); Prolonged QT interval; and Lumbar compression fracture (HCC) on their problem list.  Significant Hospital Events: Including procedures, antibiotic start and stop dates in addition to other pertinent events   12/18 admit  Interim History / Subjective:  Comfortable, able to speak clearly. Gets dyspneic with exertion.  Objective:  Blood pressure (!) 146/45, pulse 64, temperature (!) 97 F (36.1 C), temperature source Oral, resp. rate (!) 22, height 5' (1.524 m), weight 96 kg, SpO2 100%.        Intake/Output Summary (Last 24 hours) at 05/01/2023 1314 Last data filed at 05/01/2023 0848 Gross per 24 hour  Intake 315 ml  Output --  Net 315 ml   Filed Weights   04/30/23 1946  Weight: 96 kg    Examination: General: Elderly female, well nourished, resting in bed, in NAD. Neuro: A&O x 3, no deficits. HEENT: [place]/AT. Sclerae anicteric. EOMI. Cardiovascular: RRR, paced, no  M/R/G.  Lungs: Respirations even and unlabored.  CTA bilaterally, No W/R/R. Weak cough. Abdomen: BS x  4, soft, NT/ND.  Musculoskeletal: No gross deformities, no edema. TLSO brace in place. Skin: Intact, warm, no rashes.  Labs/imaging personally reviewed:  CT C/A/P 12/18 > multifocal PNA with GGO's bilaterally and small bilateral effusions and RLL consolidation  Assessment & Plan:   Acute on chronic hypoxic respiratory failure - CT shows persistent bilateral opacities that were evident on CXR last admission. Unclear etiology but broad differential. Favor multifocal PNA in pt from SNF with weak cough etc. Can not rule out autoimmune process vs HSP leading to ILD type picture. She did have recent admission with CAP coverage; therefore, this could be slow to resolve PNA needing longer treatment. Atelectasis - likely from hypoinflation in setting dyspnea + back pain from lumbar compression fx. Hx mild COPD though 2-4L O2 dependent at baseline (followed by Dr. Celine Mans). - Continue supplemental O2 as needed to maintain SpO2 > 92%. - Continue abx though would consider escalating to HAP coverage given being a SNF resident. Will await PCT and go from there. - Get full autoimmune and HSP panel. - Consider steroids. - Diuresis as able for goal net neg balance especially after PRBC resuscitation. - Ambulate/mobilize as able. - Continue BD's. - Push IS. - Consider SLP eval and possibly MBS.   Rest per primary team. Dr. Delton Coombes to see later this afternoon and will follow along.  Best practice (evaluated daily):  Per primary team.  Labs   CBC: Recent Labs  Lab 04/30/23 2006 04/30/23 2011 05/01/23 0415  WBC 9.5  --   --   NEUTROABS 6.8  --   --   HGB 7.4* 7.5* 6.9*  HCT 25.3* 22.0* 22.9*  MCV 100.0  --   --   PLT 261  --   --     Basic Metabolic Panel: Recent Labs  Lab 04/30/23 2006 04/30/23 2011 05/01/23 0415  NA 140 138 138  K 3.6 3.6 4.5  CL 102 101 103  CO2 27  --  25  GLUCOSE  91 86 178*  BUN 71* 71* 72*  CREATININE 2.05* 2.20* 2.04*  CALCIUM 9.0  --  8.2*  MG  --   --  2.0   GFR: Estimated Creatinine Clearance: 24 mL/min (A) (by C-G formula based on SCr of 2.04 mg/dL (H)). Recent Labs  Lab 04/30/23 2006  WBC 9.5    Liver Function Tests: Recent Labs  Lab 04/30/23 2006  AST 19  ALT 16  ALKPHOS 72  BILITOT 1.2*  PROT 6.0*  ALBUMIN 2.8*   No results for input(s): "LIPASE", "AMYLASE" in the last 168 hours. No results for input(s): "AMMONIA" in the last 168 hours.  ABG    Component Value Date/Time   PHART 7.324 (L) 08/07/2021 1306   PCO2ART 47.8 08/07/2021 1306   PO2ART 107 08/07/2021 1306   HCO3 35.8 (H) 04/17/2023 1220   HCO3 34.8 (H) 04/17/2023 1220   TCO2 27 04/30/2023 2011   ACIDBASEDEF 2.0 08/07/2021 1306   O2SAT 61 04/17/2023 1220   O2SAT 60 04/17/2023 1220     Coagulation Profile: Recent Labs  Lab 04/30/23 2006  INR 1.9*    Cardiac Enzymes: No results for input(s): "CKTOTAL", "CKMB", "CKMBINDEX", "TROPONINI" in the last 168 hours.  HbA1C: Hgb A1c MFr Bld  Date/Time Value Ref Range Status  04/11/2023 06:29 AM 6.0 (H) 4.8 - 5.6 % Final    Comment:    (NOTE) Pre diabetes:          5.7%-6.4%  Diabetes:              >  6.4%  Glycemic control for   <7.0% adults with diabetes   10/01/2022 10:43 AM 6.5 (H) 4.8 - 5.6 % Final    Comment:             Prediabetes: 5.7 - 6.4          Diabetes: >6.4          Glycemic control for adults with diabetes: <7.0     CBG: Recent Labs  Lab 05/01/23 0649 05/01/23 1250  GLUCAP 161* 150*    Review of Systems:   All negative; except for those that are bolded, which indicate positives.  Constitutional: weight loss, weight gain, night sweats, fevers, chills, fatigue, weakness.  HEENT: headaches, sore throat, sneezing, nasal congestion, post nasal drip, difficulty swallowing, tooth/dental problems, visual complaints, visual changes, ear aches. Neuro: difficulty with speech,  weakness, numbness, ataxia. CV:  chest pain, orthopnea, PND, swelling in lower extremities, dizziness, palpitations, syncope.  Resp: cough, hemoptysis, dyspnea, wheezing. GI: heartburn, indigestion, abdominal pain, nausea, vomiting, diarrhea, constipation, change in bowel habits, loss of appetite, hematemesis, melena, hematochezia.  GU: dysuria, change in color of urine, urgency or frequency, flank pain, hematuria. MSK: joint pain or swelling, decreased range of motion, back pain. Psych: change in mood or affect, depression, anxiety, suicidal ideations, homicidal ideations. Skin: rash, itching, bruising.   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   Surgical History:   Past Surgical History:  Procedure Laterality Date   AORTIC ARCH ANGIOGRAPHY N/A 08/07/2021   Procedure: AORTIC ARCH ANGIOGRAPHY;  Surgeon: Tonny Bollman, MD;  Location: [facility] INVASIVE CV LAB;  Service: Cardiovascular;  Laterality: N/A;   BREAST SURGERY Right    "clamped blood vessel and stitched it up; in [place]"   CARDIAC CATHETERIZATION     CARDIAC VALVE REPLACEMENT  01/26/2013   "cow valve put in; widened the second valve" (02/25/2013) - [place], [place]   CATARACT EXTRACTION W/ INTRAOCULAR LENS  IMPLANT, BILATERAL Bilateral 04/2017   CHOLECYSTECTOMY OPEN  1971   CYST EXCISION     "taken off my chest"   INSERT / REPLACE / REMOVE PACEMAKER  01/19/2013   [employer] - dual chamber    LAPAROSCOPIC GASTRIC SLEEVE RESECTION  2009   in [place]   PPM GENERATOR CHANGEOUT N/A 06/22/2022   Procedure: PPM GENERATOR CHANGEOUT;  Surgeon: Duke Salvia, MD;  Location[facility] INVASIVE CV LAB;  Service: Cardiovascular;  Laterality: N/A;   RIGHT HEART CATH N/A 04/17/2023   Procedure: RIGHT HEART CATH;  Surgeon: Kathleene Hazel, MD;  Location: [facility] INVASIVE CV LAB;  Service: Cardiovascular;  Laterality: N/A;   RIGHT/LEFT HEART CATH AND CORONARY ANGIOGRAPHY N/A 08/07/2021   Procedure: RIGHT/LEFT HEART CATH AND CORONARY ANGIOGRAPHY;  Surgeon: Tonny Bollman, MD;  Location: [facility] INVASIVE CV LAB;  Service: Cardiovascular;  Laterality: N/A;   TEE WITHOUT CARDIOVERSION N/A 06/13/2021   Procedure: TRANSESOPHAGEAL ECHOCARDIOGRAM (TEE);  Surgeon: Thurmon Fair, MD;  Location: [facility] ENDOSCOPY;  Service: Cardiovascular;  Laterality: N/A;   VEIN SURGERY Left    LLE; cut leg open and took out piece of vein that was protruding thru skin; stitched it up; done  in [place]     Social History:   reports that she quit smoking about 23 years ago. Her smoking use included cigarettes. She started smoking about 63 years ago. She has a 80 pack-year smoking history. She has never used smokeless tobacco. She reports that she does not drink alcohol and does not use drugs.   Family History:  Her family history includes Asthma in her brother; Breast cancer in her maternal aunt; Colon polyps in her brother and daughter; Diabetes in her maternal aunt and paternal aunt; Heart Problems in her father; Hypertension in her brother; Lung cancer in her mother; Skin cancer in her brother.   Allergies No Known Allergies   Home Medications  Prior to Admission medications   Medication Sig Start Date End Date Taking? Authorizing Provider  acetaminophen (TYLENOL) 325 MG tablet Take 650 mg by mouth every 8 (eight) hours as needed for mild pain (pain score 1-3).   Yes [provider]  alendronate (FOSAMAX) 70 MG tablet Take 70  mg by mouth every Sunday. 07/13/17  Yes [provider]  Amino Acids-Protein Hydrolys (FEEDING SUPPLEMENT, PRO-STAT SUGAR FREE 64,) LIQD Take 30 mLs by mouth 2 (two) times daily.   Yes [provider]  apixaban (ELIQUIS) 5 MG TABS tablet Take 5 mg by mouth 2 (two) times daily.   Yes [provider]  atorvastatin (LIPITOR) 40 MG tablet Take 40 mg by mouth daily.   Yes [provider]  budesonide-formoterol (SYMBICORT) 160-4.5 MCG/ACT inhaler Inhale 2 puffs into the lungs 2 (two) times daily.   Yes [provider]  carvedilol (COREG) 3.125 MG tablet Take 1 tablet (3.125 mg total) by mouth 2 (two) times daily. 06/23/22  Yes Jonita Albee, PA-C  Cholecalciferol 50 MCG (2000 UT) TABS Take 1 tablet by mouth daily.   Yes [provider]  Emollient (AQUAPHOR ADV THERAPY HEALING) 41 % OINT Apply 1 Application topically See admin instructions. Apply 1 application topically every shift, apply to sacrum wound.   Yes [provider]  Ferrous Sulfate (IRON) 325 (65 Fe) MG TABS Take 325 mg by mouth daily. 08/13/17  Yes [provider]  FLUoxetine (PROZAC) 40 MG capsule Take 40 mg by mouth daily.    Yes [provider]  gabapentin (NEURONTIN) 600 MG tablet Take 600 mg by mouth at bedtime. 04/24/23  Yes [provider]  guaiFENesin (MUCINEX) 600 MG 12 hr tablet Take 600 mg by mouth 2 (two) times daily.   Yes [provider]  HUMALOG KWIKPEN 100 UNIT/ML KwikPen Inject into the skin 2 (two) times daily. Sliding scale 03/11/23  Yes [provider]  hydrALAZINE (APRESOLINE) 25 MG tablet Take 1 tablet (25 mg total) by mouth every 8 (eight) hours. 04/23/23  Yes Elberta Fortis, MD  ipratropium (ATROVENT) 0.06 % nasal spray Place 2 sprays into both nostrils 3 (three) times daily. 12/14/21  Yes Charlott Holler, MD  isosorbide mononitrate (IMDUR) 30 MG 24 hr tablet Take 0.5 tablets (15 mg total) by mouth daily. 04/23/23   Yes Elberta Fortis, MD  LANTUS SOLOSTAR 100 UNIT/ML Solostar Pen Inject 6 Units into the skin at bedtime. 04/23/23  Yes Elberta Fortis, MD  levocetirizine (XYZAL) 5 MG tablet Take 5 mg by mouth at bedtime. 09/05/21  Yes [provider]  levothyroxine (SYNTHROID) 75 MCG tablet Take 75 mcg by mouth daily. 03/22/23  Yes [provider]  magnesium oxide (MAG-OX) 400 (240 Mg) MG tablet Take 400 mg by mouth daily.  Yes [provider]  [facility] powder Apply 1 Application topically 2 (two) times daily. 04/24/23  Yes [provider]  pantoprazole (PROTONIX) 40 MG tablet Take 40 mg by mouth 2 (two) times daily. 03/26/23  Yes [provider]  PROAIR RESPICLICK 108 (90 Base) MCG/ACT AEPB Inhale 2 puffs into the lungs 4 (four) times daily as needed. 02/19/23  Yes [provider]  sodium chloride (OCEAN) 0.65 % SOLN nasal spray Place 2 sprays into both nostrils every 6 (six) hours as needed for congestion.   Yes [provider]  sucralfate (CARAFATE) 1 g tablet Take 1 g by mouth 4 (four) times daily -  with meals and at bedtime.   Yes [provider]  torsemide (DEMADEX) 20 MG tablet Take 60 mg by mouth 2 (two) times daily. 04/30/23  Yes [provider]  traZODone (DESYREL) 50 MG tablet Take 0.5 tablets (25 mg total) by mouth at bedtime. 04/23/23  Yes Elberta Fortis, MD  Benzethonium Chloride 0.13 % LIQD Apply 1 Application topically 2 (two) times a week. On Tuesdays and Fridays    [provider]  nystatin ointment (MYCOSTATIN) Apply 1 application  topically daily as needed (rash). 100,000- units/gram    [provider]      Rutherford Guys, PA - [facility] For pager details, please see AMION or use Epic chat  After 1900, please call [facility] for cross coverage needs 05/01/2023, 1:14 PM

## 2023-05-01 NOTE — Assessment & Plan Note (Deleted)
Do not favor acute exacerbation, though does have productive cough. -Titrate O2 to sat goal 88-92% (4 L with exertion at home) -DuoNebs every 4 hours PRN -Continue Dulera 2 puffs BID (Symbicort formulary equivalent) -Continue guaifenesin 600 mg BID -Nightly BiPAP, no official OSA diagnosis

## 2023-05-01 NOTE — H&P (Addendum)
Hospital Admission History and Physical Service Pager: [phone]  Patient name: Tonya Glass Medical record number: [id number] Date of Birth: 10-29-1945 Age: 77 y.o. Gender: female  Primary Care Provider: No primary care provider on file. Consultants: Gastroenterology Code Status: DNR/DNI, which was confirmed with patient at bedside Preferred Emergency Contact: Contact Information     Name Relation Home Work Mobile   [place] Daughter   [phone]      Other Contacts   None on File     Chief Complaint: Progressive weakness, lower back and buttocks pain  Assessment and Plan: Tonya Glass is a 77 y.o. female presenting with progressive weakness and pain in low back/buttocks and found to be anemic below transfusion threshold.  Will transfuse 1 unit PRBC and follow up post transfusion H&H.  Does have a transfusion threshold of 8 given CAD.  Furthermore, patient has a tenuous fluid balance given CKD, HFrEF, and suspected blood loss anemia.  The patient is not tachycardic with normal blood pressure, suggesting more subacute volume loss.  At the same time, she does have JVD on exam, suggesting volume overload.  Will need to use caution to balance transfusions and volume overload.  Further complicating the clinical picture, will need to hold Eliquis in setting of suspected bleed, but patient had an apical LV thrombus noted on TTE 11/28.  Will obtain repeat echo to evaluate its status.  Most likely etiology for this patient's anemia includes UGIB, though also considered bleeding from compression fracture or abdominal hematoma.  UGIB would likely be 2/2 gastric or duodenal ulcer, versus esophagitis or gastritis.  Patient's positive FOBT and reported melanotic stool in rectal vault are suggestive of upper GI bleeding.  Additionally, UGIB is further supported by BUN/Cr of ~32 and Hgb <8.  However, the patient's abdominal hematoma or compression fracture could be contributory.  Unsure  if the patient has true multifocal pneumonia given that similar findings were noted on her CXRs this most recent admission and she is currently afebrile without leukocytosis.  However, will treat empirically for now.  If patient does have pneumonia and imaging findings are similar to those of the previous admission, it is worth considering whether she has CAP versus HAP.  Patient may benefit from goals of care conversation if she continues to deteriorate, as she is not a great candidate for aggressive intervention.  Day team to reach out to daughter and discuss care plans further. Assessment & Plan Symptomatic anemia Hgb 6.9 this AM on admission.  Concern for UGIB as above, though also with L2 compression fracture and small intramuscular abdominal hematoma. -Admit to FMTS, attending Dr. Miquel Dunn, Progressive unit -Transfuse 1 unit PRBC -Place 2 large bore IVs -f/u post-transfusion H&H -Transfusion threshold Hgb<8 given CAD, extensive cardiac Hx -NPO for possible endoscopy -Continue pantoprazole 40 mg IV BID and carafate 1 mg TID -Avoid NSAIDs -Already getting CTX for CAP coverage -Follow up GI recs in AM COPD (chronic obstructive pulmonary disease) (HCC) Do not favor acute exacerbation, though does have productive cough. -Titrate O2 to sat goal 88-92% (4 L with exertion at home) -DuoNebs every 4 hours PRN -Continue Dulera 2 puffs BID (Symbicort formulary equivalent) -Continue guaifenesin 600 mg BID -Nightly BiPAP, no official OSA diagnosis Multifocal pneumonia Not certain if truly PNA versus atelectasis since multifocal findings present last admission and patient afebrile without leukocytosis, but will treat for now.  Furthermore, if PNA, questionable if CAP versus HAP. -CTX 1 g (day 1 of 5) and azithromycin 500 mg (day  1 of 3) HFrEF (heart failure with reduced ejection fraction) (HCC) EF 35-40%, discharged on torsemide 60 mg BID.  Appears volume overloaded with JVD on exam. -Use caution with  fluids and PRBCs, Lasix 20 mg IV ordered following transfusion -Dose diuretics in AM following GI evaluation and PRBC transfusion -Strict I&Os, daily weights -Continue carvedilol 3.125 mg BID -Irbesartan and Farxiga held last admission in setting of AKI, not yet restarted outpatient, likely will need as GDMT -Cardiology consult in AM Acute kidney injury 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) Creatinine improved from discharge 12/11, but above former baseline ~1.5-1.7.  Possibly prerenal given BUN/Cr ratio >20. -Still holding Farxiga from last admission, was not restarted outpatient -Use caution with fluids given HFrEF -Avoid nephrotoxic medications, caution with contrast Prolonged QT interval QTcB 510 and receiving pantoprazole 40 mg IV BID.  However, not on any other QT prolonging meds for pantoprazole to interact with currently and benefits outweigh risks in setting of suspected UGIB. -f/u repeat EKG -Avoid QT prolonging meds Lumbar compression fracture (HCC) Suspect acute fracture d/t osteoporosis in setting of physical therapy.  Currently wearing TLSO brace and pain is minimal. -Pain regimen: Acetaminophen 650 mg Q6h scheduled, consider opiates based on AM eval -Will benefit from RD eval and nutritional supplementation to aid recovery once medically stable -Nursing order for activity as tolerated with brace -PT/OT eval and treat -Consider Neurosurgery follow up Paroxysmal atrial flutter (HCC) Will hold Eliquis 5 mg BID given suspected UGIB, restart per GI recs. -Follow up TTE to evaluate status of LV apical thrombus previously noted on TTE 11/28 -Consult Cardiology in AM -AM BMP; goal K>4, mag>2 T2DM (type 2 diabetes mellitus) (HCC) A1c 6.0, discharged on 6 units LAI daily and SSI. -CBGs Q6h while NPO -No LAI while NPO, dose as needed based on CBGs with goal 140-180 -Sensitive SSI -Still holding Farxiga from last admission as noted above  Chronic and  stable: Hypothyroidism: Continue levothyroxine 75 mcg daily, check TSH Depression: Continue fluoxetine 40 mg daily, trazodone 25 mg nightly Peripheral neuropathy: Continue gabapentin 600 mg nightly Osteoporosis: Continue cholecalciferol 2000 IU daily.  Also on alendronate 70 mg every Sunday, hold inpatient. HLD: Continue home atorvastatin 40 mg daily R leg chronic wounds: WOC to evaluate and place recommendations HTN: Continue Imdur 15 mg daily, hydralazine 25 mg Q8h, holding irbesartan and Farxiga from last admission  FEN/GI: NPO pending GI evaluation VTE Prophylaxis: SCDs, hold pharmacologic PPx in setting of possible UGIB  Disposition: Progressive  History of Present Illness: Tonya Glass is a 77 y.o. female with a pertinent PMH of HFpEF, HLD, aortic stenosis s/p valve replacement, sick sinus syndrome, hypothyroidism, HTN, CKD stage IV, T2DM, and COPD on home oxygen 4L with exertion presenting with progressive weakness and pain in low back/buttocks.  Of note, patient was recently admitted to [facility] on 11/27 for CHF exacerbation and discharged on 12/11.  During that admission, patient was thoroughly diuresed and experienced AKI, which was improving at time of discharge.  She also had anemia thought to be due to mix of chronic disease and iron deficiency, did require 1 unit PRBC transfusion on 12/1.  Was recommended for outpatient colonoscopy, which was not yet completed at time of readmission  In the ED, patient was placed on 4L [place].  CT CAP showed possible multifocal pneumonia with bilateral pleural effusions and dependent RLL consolidation (atelectasis versus PNA).  Started on CTX 1 g and azithromycin 500 mg.  CT also showed small intramuscular abdominal wall hematoma.  Hgb low at 7.4, type and screen obtained.  PT/INR both elevated.  FOBT positive with melanotic stool in rectal vault on exam.  GI was consulted and patient was given pantoprazole 40 mg IV, though PA at signout unable to confirm  if GI will see patient in AM.  Further noted to have L2 vertebral body undulation on CT, suspicious for compression fracture.  Placed in a TLSO brace per Neurosurgery PA [facility].  FMTS consulted for admission.  On FMTS evaluation, patient reports progressive weakness for the past week along with low back and buttock pain, in particular with PT at [facility].  Also notes intermittent productive cough and pleuritic left sided chest pain (not present currently).  Endorses chronic shortness of breath with intermittent use of home 4 L oxygen.  Reports she did undergo some PT at her SNF but denies any falls or recent trauma to her back or abdomen.  Has not noticed dark or bloody stools, last BM was yesterday 12/17.   Review Of Systems: Per HPI.  Pertinent Past Medical History: -HFrEF -HLD -Aortic stenosis s/p valve replacement -Sick sinus syndrome -Hypothyroidism -Peripheral neuropathy -HTN -CKD 3b -COPD -T2DM  Remainder reviewed in history tab.   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  Pertinent Social History: Tobacco use: 40 years x 2.5 PPD, quit ~4 years ago Alcohol use: None Other Substance use: None Lives with roommate at [facility] facility, has been in SNF  Pertinent Family History: -Lung cancer in mother -Skin cancer in brother -Father with cardiac issues  Remainder reviewed in history tab.   Important Outpatient Medications: -Olmesartan 20 mg daily (held at discharge 12/11) -Jardiance 10 mg daily (held at discharge 12/11)  -Alendronate 70 mg every Sunday -Atorvastatin 40 mg daily -Symbicort 2 puffs BID -Carvedilol 3.125 mg BID -Eliquis 5 mg BID -Floxetine 40 mg daily -Gabapentin 600 mg QHS -Guaifenesin 600 mg BID -Hydralazine 25 mg Q8h -Iron 325 mg  daily -Lantus 6 units QHS -Levothyroxine 75 mcg daily -ProAir RespiClick QID PRN -Sucralfate 1 g QID -Torsemide 60 mg BID  Remainder reviewed in medication history.   Objective: BP (!) 133/55   Pulse 71   Temp 98.7 F (37.1 C) (Oral)   Resp (!) 24   Ht 5' (1.524 m)   Wt 96 kg   SpO2 99%   BMI 41.33 kg/m   Physical Exam: General: Elderly female, resting in bed in TLSO brace.  NAD, but uncomfortable appearing.  Alert and oriented x4. Eyes: Mucosal pallor. ENTM: MMM. Neck: JVD 2-3 cm below angle of jaw. Cardiovascular: RRR.  Prosthetic valve systolic murmur.  No rubs/gallops. Respiratory: Difficult to auscultate given body habitus and positioning in brace.  However, no wheezes or crackles heard on limited exam.  Normal WOB on 4 L O2. Gastrointestinal: Mild epigastric TTP, but otherwise no rebound or guarding.  No abdominal distension.  Normoactive bowel sounds. MSK: Deconditioned, poor mobility in the brace. Extremities: Capillary refill <2 seconds.  Bilateral LE in compression wraps, significant pitting edema. 2+ DP and PT pulses bilaterally. Derm: No ecchymoses. Neuro: Alert and oriented x4.  No focal neurological deficit. Psych: Pleasant, appropriate.  Forgetful, poor historian.  Attention and concentration normal.  Labs:  CBC BMET  Recent Labs  Lab 04/30/23 2006 04/30/23 2011  WBC 9.5  --   HGB 7.4* 7.5*  HCT 25.3* 22.0*  PLT 261  --  Recent Labs  Lab 04/30/23 2006 04/30/23 2011  NA 140 138  K 3.6 3.6  CL 102 101  CO2 27  --   BUN 71* 71*  CREATININE 2.05* 2.20*  GLUCOSE 91 86  CALCIUM 9.0  --      Pertinent additional labs: -Troponin 57>52 -FOBT positive  Own interpretation of EKG: Paced rhythm, possible Wellens waves in V2/V3, ?RBBB.  QTcB 510.  Imaging Studies: -CT CAP: Possible multifocal pneumonia with bilateral pleural effusions and dependent RLL consolidation (atelectasis versus PNA).  Small intramuscular abdominal wall hematoma.  L2  vertebral body undulation, suspicious for compression fracture.  Independently reviewed and agree with radiologist's interpretation.  Dimitry Sharion Dove, MD 05/01/2023, 3:48 AM  PGY-1, [facility] FPTS Intern pager: [phone], text pages welcome Secure chat group [facility] Teaching Service   I was personally present and performed or re-performed the history, physical exam and medical decision making activities of this service and have verified that the service and findings are accurately documented in the resident's note.  Vonna Drafts, MD                  05/01/2023, 7:31 AM

## 2023-05-01 NOTE — Assessment & Plan Note (Signed)
Not certain if truly PNA versus atelectasis since multifocal findings present last admission and patient afebrile without leukocytosis, but will treat for now.  Furthermore, if PNA, questionable if CAP versus HAP. -CTX 1 g (day 1 of 5) and azithromycin 500 mg (day 1 of 3)

## 2023-05-01 NOTE — Plan of Care (Signed)
Called Cardiology PA overnight to discuss EKG findings (significant change from prior 11/28, T wave inversions in lateral leads) in setting of extensive cardiac history.  PA Dayna Dunn deferred to Cardiologist on call, who returned call shortly after.  He stated that repeat EKG was not concerning in setting of profound anemia and paced rhythm, no intervention indicated.  Cardiologist then ended call.

## 2023-05-01 NOTE — Consult Note (Signed)
Consultation  Referring Provider:   [facility] Primary Care Physician:  No primary care provider on file. Primary Gastroenterologist:  Dr. Myrtie Neither       Reason for Consultation:   Anemia in setting of multifocal pneumonia DOA: 04/30/2023         Hospital Day: 2         HPI:   Tonya Glass is a 77 y.o. female with past medical history significant for type 2 diabetes, aortic valve stenosis status post aortic valve replacement with bioprosthetic valve, SSS status post pacemaker, history of CVA, anemia, CKD, atrial flutter on Eliquis, hypothyroidism, COPD with OSA, peripheral venous insufficiency, cor pulmonale, chronic right heart failure, memory loss, obesity.   Colonoscopy by Dr. Adela Lank 02/01/2016 for positive fit test to the terminal ileum had an excellent preparation and only a diminutive rectal hyperplastic polyp was removed with no other significant findings.   Presents from [place] (SNF) to the ER with progressive weakness and found to have anemia.  Patient was also describing some chest discomfort.   Work up notable for  Hgb 7.4, repeat 6.9 FOBT positive Baseline hemoglobin between 8-10, most recently about 10 days ago was 8.7 No leukocytosis, normal platelets BUN 71, creatinine 2.20, creatinine appears to be close to baseline, did have increase in BUN Troponin 57, repeat 52 Albumin 2.8, bilirubin 1.2 otherwise unremarkable liver function INR 1.9 in setting of Eliquis  pending BNP CT chest abdomen pelvis without contrast shows moderate to advanced multifocal lung opacities with groundglass and nodular configuration suspicious for multifocal pneumonia, airspace nodularity subscribed previous left lower lobe pulmonary nodule.  Small left and's small to moderate right pleural effusions, mildly enlarged borderline mediastinal lymph nodes likely reactive.  L2 vertebral undulation suspicious for subacute or acute compression fracture, 2.3 x 1.2 cm hyperdensity in the right  lateral abdominal wall musculature suspicious for small intramuscular abdominal wall hematoma  No family was present at the time of my evaluation. Patient rather pleasant, was aware of where she is at why she was here urine date, appeared to be well-oriented at this time. Patient was in neck/back brace and was uncertain why she was put in that denies any recent falls. States she has been having cough with mucus production with chills, weakness, no fevers.  Denies any chest pain. States she has a bowel movement once a day denies seeing any melena or hematochezia.  Denies reflux, dysphagia, abdominal pain. Per records had 1 episode of nonbloody nonbilious emesis on arrival in the ER, otherwise has not had any other GI symptoms. Last bowel movement yesterday. Patient denies any oropharyngeal dysphagia or coughing while eating. States she is normally on 4 L at home of oxygen. States she had Eliquis yesterday at some point uncertain if this was morning or in the evening.   Abnormal ED labs: Abnormal Labs Reviewed  COMPREHENSIVE METABOLIC PANEL - Abnormal; Notable for the following components:      Result Value   BUN 71 (*)    Creatinine, Ser 2.05 (*)    Total Protein 6.0 (*)    Albumin 2.8 (*)    Total Bilirubin 1.2 (*)    GFR, Estimated 25 (*)    All other components within normal limits  CBC WITH DIFFERENTIAL/PLATELET - Abnormal; Notable for the following components:   RBC 2.53 (*)    Hemoglobin 7.4 (*)    HCT 25.3 (*)    MCHC 29.2 (*)    RDW 17.3 (*)  Abs Immature Granulocytes 0.09 (*)    All other components within normal limits  PROTIME-INR - Abnormal; Notable for the following components:   Prothrombin Time 21.6 (*)    INR 1.9 (*)    All other components within normal limits  HEMOGLOBIN AND HEMATOCRIT, BLOOD - Abnormal; Notable for the following components:   Hemoglobin 6.9 (*)    HCT 22.9 (*)    All other components within normal limits  BASIC METABOLIC PANEL - Abnormal;  Notable for the following components:   Glucose, Bld 178 (*)    BUN 72 (*)    Creatinine, Ser 2.04 (*)    Calcium 8.2 (*)    GFR, Estimated 25 (*)    All other components within normal limits  I-STAT CHEM 8, ED - Abnormal; Notable for the following components:   BUN 71 (*)    Creatinine, Ser 2.20 (*)    Calcium, Ion 1.12 (*)    Hemoglobin 7.5 (*)    HCT 22.0 (*)    All other components within normal limits  POC OCCULT BLOOD, ED - Abnormal; Notable for the following components:   Fecal Occult Bld POSITIVE (*)    All other components within normal limits  CBG MONITORING, ED - Abnormal; Notable for the following components:   Glucose-Capillary 161 (*)    All other components within normal limits  TROPONIN I (HIGH SENSITIVITY) - Abnormal; Notable for the following components:   Troponin I (High Sensitivity) 57 (*)    All other components within normal limits  TROPONIN I (HIGH SENSITIVITY) - Abnormal; Notable for the following components:   Troponin I (High Sensitivity) 52 (*)    All other components within normal limits    Past Medical History:  Diagnosis Date   Anxiety    Aortic valve stenosis 10/10/2016   Arthritis    "fingers" (07/24/2017)   CHF (congestive heart failure) (HCC)    Constipation 04/15/2023   COPD (chronic obstructive pulmonary disease) (HCC)    Depression    Gallstones    Heart murmur    High cholesterol    History of kidney stones    Hypertension    Hypothyroidism    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   Migraines    "none in years til 01/2013-02/2013; none since"  (07/24/2017)   Orthopnoea    "just since open heart surgery" (02/25/2013)   Pacemaker    [employer]    Pneumonia ~ 2012   "walking pneumonia"   Pneumonia 07/23/2017   Pneumonia 07/23/2017   Stroke (HCC) 2001   denies residual on 02/25/2013   Symptomatic hypotension 10/10/2016   Syncope and collapse    "prior to open heart surgery" (02/25/2013)   Type II diabetes mellitus (HCC)      Surgical History:  She  has a past surgical history that includes Laparoscopic gastric sleeve resection (2009); Cholecystectomy open (1971); Cataract extraction w/ intraocular lens  implant, bilateral (Bilateral, 04/2017); Cardiac catheterization; Insert / replace / remove pacemaker (01/19/2013); Cardiac valve replacement (01/26/2013); Cyst excision; Vein Surgery (Left); Breast surgery (Right); TEE without cardioversion (N/A, 06/13/2021); RIGHT/LEFT HEART CATH AND CORONARY ANGIOGRAPHY (N/A, 08/07/2021); AORTIC ARCH ANGIOGRAPHY (N/A, 08/07/2021); PPM GENERATOR CHANGEOUT (N/A, 06/22/2022); and RIGHT HEART CATH (N/A, 04/17/2023). Family History:  Her family history includes Asthma in her brother; Breast cancer in her maternal aunt; Colon polyps in her brother and daughter; Diabetes in her maternal aunt and paternal aunt; Heart Problems in her father; Hypertension in her brother; Lung cancer in her mother; Skin  cancer in her brother. Social History:   reports that she quit smoking about 23 years ago. Her smoking use included cigarettes. She started smoking about 63 years ago. She has a 80 pack-year smoking history. She has never used smokeless tobacco. She reports that she does not drink alcohol and does not use drugs.  Prior to Admission medications   Medication Sig Start Date End Date Taking? Authorizing Provider  acetaminophen (TYLENOL) 325 MG tablet Take 650 mg by mouth every 8 (eight) hours as needed for mild pain (pain score 1-3).   Yes [provider]  alendronate (FOSAMAX) 70 MG tablet Take 70 mg by mouth every Sunday. 07/13/17  Yes [provider]  Amino Acids-Protein Hydrolys (FEEDING SUPPLEMENT, PRO-STAT SUGAR FREE 64,) LIQD Take 30 mLs by mouth 2 (two) times daily.   Yes [provider]  apixaban (ELIQUIS) 5 MG TABS tablet Take 5 mg by mouth 2 (two) times daily.   Yes [provider]  atorvastatin (LIPITOR) 40 MG tablet Take 40 mg by mouth daily.   Yes [provider]  budesonide-formoterol (SYMBICORT) 160-4.5 MCG/ACT inhaler Inhale 2 puffs into the lungs 2 (two) times daily.   Yes [provider]  carvedilol (COREG) 3.125 MG tablet Take 1 tablet (3.125 mg total) by mouth 2 (two) times daily. 06/23/22  Yes Jonita Albee, PA-C  Cholecalciferol 50 MCG (2000 UT) TABS Take 1 tablet by mouth daily.   Yes [provider]  Emollient (AQUAPHOR ADV THERAPY HEALING) 41 % OINT Apply 1 Application topically See admin instructions. Apply 1 application topically every shift, apply to sacrum wound.   Yes [provider]  Ferrous Sulfate (IRON) 325 (65 Fe) MG TABS Take 325 mg by mouth daily. 08/13/17  Yes [provider]  FLUoxetine (PROZAC) 40 MG capsule Take 40 mg by mouth daily.    Yes [provider]  gabapentin (NEURONTIN) 600 MG tablet Take 600 mg by mouth at bedtime. 04/24/23  Yes [provider]  guaiFENesin (MUCINEX) 600 MG 12 hr tablet Take 600 mg by mouth 2 (two) times daily.   Yes [provider]  HUMALOG KWIKPEN 100 UNIT/ML KwikPen Inject into the skin 2 (two) times daily. Sliding scale 03/11/23  Yes [provider]  hydrALAZINE (APRESOLINE) 25 MG tablet Take 1 tablet (25 mg total) by mouth every 8 (eight) hours. 04/23/23  Yes Elberta Fortis, MD  ipratropium (ATROVENT) 0.06 % nasal spray Place 2 sprays into both nostrils 3 (three) times daily. 12/14/21  Yes Charlott Holler, MD  isosorbide mononitrate (IMDUR) 30 MG 24 hr tablet Take 0.5 tablets (15 mg total) by mouth daily. 04/23/23  Yes Elberta Fortis, MD  LANTUS SOLOSTAR 100 UNIT/ML Solostar Pen Inject 6 Units into the skin at bedtime. 04/23/23  Yes Elberta Fortis, MD  levocetirizine (XYZAL) 5 MG tablet Take 5 mg by mouth at bedtime. 09/05/21  Yes [provider]  levothyroxine (SYNTHROID) 75 MCG tablet Take 75 mcg by mouth daily. 03/22/23  Yes [provider]  magnesium oxide (MAG-OX) 400 (240 Mg) MG tablet  Take 400 mg by mouth daily.   Yes [provider]  [facility] powder Apply 1 Application topically 2 (two) times daily. 04/24/23  Yes [provider]  pantoprazole (PROTONIX) 40 MG tablet Take 40 mg by mouth 2 (two) times daily. 03/26/23  Yes [provider]  PROAIR RESPICLICK 108 (90 Base) MCG/ACT AEPB Inhale 2 puffs into the lungs 4 (four) times daily as needed. 02/19/23  Yes [provider]  sodium chloride (OCEAN) 0.65 % SOLN nasal spray Place 2 sprays into both nostrils every 6 (six) hours as needed for congestion.   Yes [provider]  sucralfate (CARAFATE) 1 g tablet Take 1 g by mouth 4 (four) times daily -  with meals and at bedtime.   Yes [provider]  torsemide (DEMADEX) 20 MG tablet Take 60 mg by mouth 2 (two) times daily. 04/30/23  Yes [provider]  traZODone (DESYREL) 50 MG tablet Take 0.5 tablets (25 mg total) by mouth at bedtime. 04/23/23  Yes Elberta Fortis, MD  Benzethonium Chloride 0.13 % LIQD Apply 1 Application topically 2 (two) times a week. On Tuesdays and Fridays    [provider]  nystatin ointment (MYCOSTATIN) Apply 1 application  topically daily as needed (rash). 100,000- units/gram    [provider]    Current Facility-Administered Medications  Medication Dose Route Frequency Provider Last Rate Last Admin   acetaminophen (TYLENOL) tablet 650 mg  650 mg Oral Q6H Shitarev, Dimitry, MD   650 mg at 05/01/23 0658   atorvastatin (LIPITOR) tablet 40 mg  40 mg Oral Daily Shitarev, Dimitry, MD       [START ON 05/02/2023] azithromycin (ZITHROMAX) 500 mg in sodium chloride 0.9 % 250 mL IVPB  500 mg Intravenous Q24H Shitarev, Dimitry, MD       carvedilol (COREG) tablet 3.125 mg  3.125 mg Oral BID Shitarev, Dimitry, MD       cefTRIAXone (ROCEPHIN) 2 g in sodium chloride 0.9 % 100 mL IVPB  2 g Intravenous Q24H Shitarev, Dimitry, MD       cholecalciferol (VITAMIN D3) 25 MCG (1000 UNIT) tablet 1,000  Units  1,000 Units Oral Daily Shitarev, Dimitry, MD       FLUoxetine (PROZAC) capsule 40 mg  40 mg Oral Daily Shitarev, Dimitry, MD       furosemide (LASIX) injection 20 mg  20 mg Intravenous Once Shitarev, Dimitry, MD       gabapentin (NEURONTIN) capsule 600 mg  600 mg Oral QHS Shitarev, Dimitry, MD       guaiFENesin (MUCINEX) 12 hr tablet 600 mg  600 mg Oral BID Shitarev, Dimitry, MD       hydrALAZINE (APRESOLINE) tablet 25 mg  25 mg Oral Q8H Shitarev, Dimitry, MD   25 mg at 05/01/23 0658   insulin aspart (novoLOG) injection 0-9 Units  0-9 Units Subcutaneous Q6H Shitarev, Dimitry, MD   2 Units at 05/01/23 0658   ipratropium (ATROVENT) 0.06 % nasal spray 2 spray  2 spray Each Nare TID Shitarev, Dimitry, MD       ipratropium-albuterol (DUONEB) 0.5-2.5 (3) MG/3ML nebulizer solution 3 mL  3 mL Nebulization Q6H PRN Shitarev, Dimitry, MD       isosorbide mononitrate (IMDUR) 24 hr tablet 15 mg  15 mg Oral Daily Shitarev, Dimitry, MD       levothyroxine (SYNTHROID) tablet 75 mcg  75 mcg Oral Daily Shitarev, Dimitry, MD   75 mcg at 05/01/23 0658   mometasone-formoterol (DULERA) 200-5 MCG/ACT inhaler 2 puff  2 puff Inhalation BID Shitarev, Dimitry, MD       pantoprazole (PROTONIX) injection 40 mg  40 mg Intravenous BID Shitarev, Dimitry, MD       sucralfate (CARAFATE) tablet 1 g  1 g Oral TID WC & HS Shitarev, Dimitry, MD       traZODone (DESYREL) tablet 25 mg  25 mg Oral QHS Shitarev, Dimitry, MD  Current Outpatient Medications  Medication Sig Dispense Refill   acetaminophen (TYLENOL) 325 MG tablet Take 650 mg by mouth every 8 (eight) hours as needed for mild pain (pain score 1-3).     alendronate (FOSAMAX) 70 MG tablet Take 70 mg by mouth every Sunday.     Amino Acids-Protein Hydrolys (FEEDING SUPPLEMENT, PRO-STAT SUGAR FREE 64,) LIQD Take 30 mLs by mouth 2 (two) times daily.     apixaban (ELIQUIS) 5 MG TABS tablet Take 5 mg by mouth 2 (two) times daily.     atorvastatin (LIPITOR) 40 MG tablet  Take 40 mg by mouth daily.     budesonide-formoterol (SYMBICORT) 160-4.5 MCG/ACT inhaler Inhale 2 puffs into the lungs 2 (two) times daily.     carvedilol (COREG) 3.125 MG tablet Take 1 tablet (3.125 mg total) by mouth 2 (two) times daily.     Cholecalciferol 50 MCG (2000 UT) TABS Take 1 tablet by mouth daily.     Emollient (AQUAPHOR ADV THERAPY HEALING) 41 % OINT Apply 1 Application topically See admin instructions. Apply 1 application topically every shift, apply to sacrum wound.     Ferrous Sulfate (IRON) 325 (65 Fe) MG TABS Take 325 mg by mouth daily.  0   FLUoxetine (PROZAC) 40 MG capsule Take 40 mg by mouth daily.      gabapentin (NEURONTIN) 600 MG tablet Take 600 mg by mouth at bedtime.     guaiFENesin (MUCINEX) 600 MG 12 hr tablet Take 600 mg by mouth 2 (two) times daily.     HUMALOG KWIKPEN 100 UNIT/ML KwikPen Inject into the skin 2 (two) times daily. Sliding scale     hydrALAZINE (APRESOLINE) 25 MG tablet Take 1 tablet (25 mg total) by mouth every 8 (eight) hours.     ipratropium (ATROVENT) 0.06 % nasal spray Place 2 sprays into both nostrils 3 (three) times daily. 15 mL 12   isosorbide mononitrate (IMDUR) 30 MG 24 hr tablet Take 0.5 tablets (15 mg total) by mouth daily.     LANTUS SOLOSTAR 100 UNIT/ML Solostar Pen Inject 6 Units into the skin at bedtime. 15 mL 11   levocetirizine (XYZAL) 5 MG tablet Take 5 mg by mouth at bedtime.     levothyroxine (SYNTHROID) 75 MCG tablet Take 75 mcg by mouth daily.     magnesium oxide (MAG-OX) 400 (240 Mg) MG tablet Take 400 mg by mouth daily.     NYAMYC powder Apply 1 Application topically 2 (two) times daily.     pantoprazole (PROTONIX) 40 MG tablet Take 40 mg by mouth 2 (two) times daily.     PROAIR RESPICLICK 108 (90 Base) MCG/ACT AEPB Inhale 2 puffs into the lungs 4 (four) times daily as needed.     sodium chloride (OCEAN) 0.65 % SOLN nasal spray Place 2 sprays into both nostrils every 6 (six) hours as needed for congestion.     sucralfate  (CARAFATE) 1 g tablet Take 1 g by mouth 4 (four) times daily -  with meals and at bedtime.     torsemide (DEMADEX) 20 MG tablet Take 60 mg by mouth 2 (two) times daily.     traZODone (DESYREL) 50 MG tablet Take 0.5 tablets (25 mg total) by mouth at bedtime.     Benzethonium Chloride 0.13 % LIQD Apply 1 Application topically 2 (two) times a week. On Tuesdays and Fridays     nystatin ointment (MYCOSTATIN) Apply 1 application  topically daily as needed (rash). 100,000- units/gram  Allergies as of 04/30/2023   (No Known Allergies)    Review of Systems:    Constitutional: No weight loss, fever, chills, weakness or fatigue HEENT: Eyes: No change in vision               Ears, Nose, Throat:  No change in hearing or congestion Skin: No rash or itching Cardiovascular: No chest pain, chest pressure or palpitations   Respiratory: No SOB or cough Gastrointestinal: See HPI and otherwise negative Genitourinary: No dysuria or change in urinary frequency Neurological: No headache, dizziness or syncope Musculoskeletal: No new muscle or joint pain Hematologic: No bleeding or bruising Psychiatric: No history of depression or anxiety     Physical Exam:  Vital signs in last 24 hours: Temp:  [98 F (36.7 C)-99.3 F (37.4 C)] 99.3 F (37.4 C) (12/18 0645) Pulse Rate:  [61-79] 64 (12/18 0830) Resp:  [18-29] 20 (12/18 0830) BP: (96-152)/(38-82) 136/49 (12/18 0830) SpO2:  [96 %-100 %] 100 % (12/18 0830) Weight:  [96 kg] 96 kg (12/17 1946)   Last BM recorded by nurses in past 5 days No data recorded  General:   Pleasant, well developed female in no acute distress Head:  Normocephalic and atraumatic. Eyes: sclerae anicteric,conjunctive pale  Heart:  regular rate and rhythm, systolic murmur Pulm: 2 to 3 L oxygen with wheezing and rhonchi bilaterally Abdomen:  Soft, Obese AB, Active bowel sounds. No tenderness . Without guarding and Without rebound, No organomegaly appreciated. Extremities:  With  1+ edema bilateral legs with ecchymoses and stasis dermatitis. Msk:  Symmetrical without gross deformities. Peripheral pulses intact.  Patient with back/neck brace on, denies any falls Neurologic:  Alert and  oriented x4;  No focal deficits.  Skin: Ecchymoses along bilateral legs and arms.  No rashes Psychiatric:  Cooperative. Normal mood and affect.  LAB RESULTS: Recent Labs    04/30/23 2006 04/30/23 2011 05/01/23 0415  WBC 9.5  --   --   HGB 7.4* 7.5* 6.9*  HCT 25.3* 22.0* 22.9*  PLT 261  --   --    BMET Recent Labs    04/30/23 2006 04/30/23 2011 05/01/23 0415  NA 140 138 138  K 3.6 3.6 4.5  CL 102 101 103  CO2 27  --  25  GLUCOSE 91 86 178*  BUN 71* 71* 72*  CREATININE 2.05* 2.20* 2.04*  CALCIUM 9.0  --  8.2*   LFT Recent Labs    04/30/23 2006  PROT 6.0*  ALBUMIN 2.8*  AST 19  ALT 16  ALKPHOS 72  BILITOT 1.2*   PT/INR Recent Labs    04/30/23 2006  LABPROT 21.6*  INR 1.9*    STUDIES: CT CHEST ABDOMEN PELVIS WO CONTRAST Result Date: 04/30/2023 CLINICAL DATA:  Chest pain.  Back pain.  Hemoglobin drop. EXAM: CT CHEST, ABDOMEN AND PELVIS WITHOUT CONTRAST TECHNIQUE: Multidetector CT imaging of the chest, abdomen and pelvis was performed following the standard protocol without IV contrast. RADIATION DOSE REDUCTION: This exam was performed according to the departmental dose-optimization program which includes automated exposure control, adjustment of the mA and/or kV according to patient size and/or use of iterative reconstruction technique. COMPARISON:  CT 07/06/2021 FINDINGS: CT CHEST FINDINGS Cardiovascular: Left-sided pacemaker in place. Heart is normal in size. Coronary artery calcifications. Prosthetic aortic valve. Advanced aortic atherosclerosis. Dilated descending aorta measuring 3.7 cm post median sternotomy. No pericardial effusion. Decreased density of the blood pool suggestive of anemia. Mediastinum/Nodes: Mildly enlarged and borderline mediastinal lymph  nodes, including  a 14 mm anterior paratracheal node series 2, image 20. Hilar assessment is limited in the absence of IV contrast. Small to moderate-sized hiatal hernia. Lungs/Pleura: Moderate to advanced multifocal lung opacities with ground-glass and nodular configuration. Airspace disease obscures the area of previous left lower lobe nodule. Small left and small to moderate right pleural effusion, simple fluid density. Consolidation in the dependent right lower lobe may represent compressive atelectasis or infection. There is moderate right middle and lower lobe bronchial thickening. No endobronchial lesion. Musculoskeletal: Moderate thoracic spondylosis with diffuse anterior spurring. Prior median sternotomy. There are no acute or suspicious osseous abnormalities. CT ABDOMEN PELVIS FINDINGS Hepatobiliary: Unremarkable unenhanced appearance of the liver. Clips in the gallbladder fossa postcholecystectomy. No biliary dilatation. Pancreas: Parenchymal atrophy. No ductal dilatation or inflammation. Spleen: Normal in size without focal abnormality. Adrenals/Urinary Tract: No adrenal nodule. No hydronephrosis. No renal calculi. No evidence of focal renal abnormality. Decompressed ureters. Nondistended urinary bladder. Stomach/Bowel: Postsurgical change in the greater curvature of the stomach moderate-sized hiatal hernia. The appendix is not definitively seen. No evidence of bowel wall thickening, distention, or inflammatory changes. Small to moderate volume of colonic stool. Vascular/Lymphatic: Advanced aortic and branch atherosclerosis. Retroaortic left renal vein. No evidence of retroperitoneal hematoma. No adenopathy. Reproductive: Uterus and bilateral adnexa are unremarkable. Other: Broad-based rectus diastasis. Small fat containing umbilical hernia. 2.3 x 1.2 cm hyperdensity in the right lateral abdominal wall musculature, series 2, image 83. This is new from prior exam. Scattered gluteal granulomas are again  seen. No significant ascites. No free air. Musculoskeletal: Moderate diffuse lumbar degenerative change. Scattered Schmorl's nodes. Chronic compression deformity of L5. Undulation of inferior L2 vertebral body is new from prior. IMPRESSION: 1. Moderate to advanced multifocal lung opacities with ground-glass and nodular configuration, suspicious for multifocal pneumonia. Airspace nodularity obscures the previous left lower lobe pulmonary nodule. 2. Small left and small to moderate right pleural effusions. Consolidation in the dependent right lower lobe may represent compressive atelectasis or infection. 3. Mildly enlarged and borderline mediastinal lymph nodes, likely reactive. 4. Undulation of inferior L2 vertebral body is new from prior exam, suspicious for subacute or acute compression fracture. Recommend correlation with focal tenderness. 5. A 2.3 x 1.2 cm hyperdensity in the right lateral abdominal wall musculature is new from prior exam, suspicious for small intramuscular abdominal wall hematoma. Aortic Atherosclerosis (ICD10-I70.0). Electronically Signed   By: Narda Rutherford M.D.   On: 04/30/2023 22:45      Impression    Acute on chronic symptomatic anemia on Eliquis Hemoglobin 6.9 on repeat, baseline appears to be between 8 and 9 FOBT positive Has had some BUN elevation out of portion to creatinine 01/2016 colonoscopy due to positive FIT diminutive hyperplastic polyp CT chest abdomen pelvis 2.3 x 1.2 cm intramuscular abdominal wall hematoma Has been transfused 1 PRBC transfusing second Patient currently on Protonix 40 mg twice daily and Carafate  Atrial flutter on Eliquis Last dose sometime yesterday  CKD Likely contributing to anemia  Creatinine baseline appears to be around 1.8 to, currently at 2.20 with BUN elevated at 71  Multifocal pneumonia in setting of COPD with OSA overlap, cor pulmonale and chronic right heart failure CT chest shows advanced multifocal lung opacities with  groundglass and nodular configuration suspicious for multifocal pneumonia, small left and small to moderate right pleural effusions No leukocytosis Negative respiratory panel, pending strep and Legionella Patient denies oropharyngeal symptoms or signs of aspiration Consider MBS if any signs of coughing while eating or drinking Patient on Rocephin  and azithromycin  Morbid obesity  Memory loss Appears to be oriented at this time  Insulin-dependent type 2 diabetes  Principal Problem:   Symptomatic anemia Active Problems:   T2DM (type 2 diabetes mellitus) (HCC)   Paroxysmal atrial flutter (HCC)   Acute kidney injury 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)   COPD (chronic obstructive pulmonary disease) (HCC)   Multifocal pneumonia   HFrEF (heart failure with reduced ejection fraction) (HCC)   Prolonged QT interval   Lumbar compression fracture (HCC)    LOS: 0 days     Plan   77 year old female with history of atrial flutter on Eliquis, CKD, COPD with OSA, chronic right heart failure, cor pulmonale presents with weakness, found to have multifocal pneumonia and acute on chronic anemia, she is FOBT positive, had colonoscopy 2017 unremarkable for positive fit does have slight elevation of BUN out of proportion to creatinine Some concern for upper GI bleed -Continue Protonix 40 mg twice daily -Continue supportive care transfuse hemoglobin less than 7 -Hold Eliquis -Monitor CBC, c-Met -Check iron, ferritin, B12, iron as needed -Consider EGD when patient is more stable from respiratory standpoint  Thank you for your kind consultation, we will continue to follow.   Doree Albee  05/01/2023, 8:47 AM

## 2023-05-01 NOTE — ED Notes (Signed)
ED TO INPATIENT HANDOFF REPORT  ED Nurse Name and Phone #: Berna Spare [phone]  S Name/Age/Gender Tonya Glass 77 y.o. female Room/Bed: 044C/044C  Code Status   Code Status: Limited: Do not attempt resuscitation (DNR) -DNR-LIMITED -Do Not Intubate/DNI   Home/SNF/Other Skilled nursing facility Patient oriented to: self, place, and time Is this baseline? Yes   Triage Complete: Triage complete  Chief Complaint Symptomatic anemia [D64.9]  Triage Note Pt presents via EMS c/o weakness and low hemoglobin per EMS from [facility]. Pt reports back pain and buttocks pain x2 days. A&O x4. EMS reports pt c/o increased weakness interfereing with ADLs for over 1 week.   Pt on 4L 02 at home per EMS.    Allergies No Known Allergies  Level of Care/Admitting Diagnosis ED Disposition     ED Disposition  Admit   Condition  --   Comment  Hospital Area: [facility] [[id number]]  Level of Care: Progressive [102]  Admit to Progressive based on following criteria: GI, ENDOCRINE disease patients with GI bleeding, acute liver failure or pancreatitis, stable with diabetic ketoacidosis or thyrotoxicosis (hypothyroid) state.  May admit patient to [facility] or [facility] if equivalent level of care is available:: No  Covid Evaluation: Asymptomatic - no recent exposure (last 10 days) testing not required  Diagnosis: Symptomatic anemia [[id number]]  Admitting Physician: Nelia Shi [[id number]]  Attending Physician: Billey Co [[id number]]  Certification:: I certify this patient will need inpatient services for at least 2 midnights  Expected Medical Readiness: 05/04/2023          B Medical/Surgery History Past Medical History:  Diagnosis Date   Anemia    Anxiety    Aortic valve stenosis 10/10/2016   Arthritis    "fingers" (07/24/2017)   Chronic HFrEF (heart failure with reduced ejection fraction) (HCC)    CKD (chronic kidney disease)    Constipation  04/15/2023   COPD (chronic obstructive pulmonary disease) (HCC)    Depression    Gallstones    Heart murmur    High cholesterol    History of kidney stones    Hypertension    Hypothyroidism    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   Migraines    "none in years til 01/2013-02/2013; none since"  (07/24/2017)   Orthopnoea    "just since open heart surgery" (02/25/2013)   Pacemaker    [employer]    Pneumonia 07/23/2017   Stroke (HCC) 2001   denies residual on 02/25/2013   Symptomatic hypotension 10/10/2016   Syncope and collapse    "prior to open heart surgery" (02/25/2013)   Type II diabetes mellitus (HCC)    Past Surgical History:  Procedure Laterality Date   AORTIC ARCH ANGIOGRAPHY N/A 08/07/2021   Procedure: AORTIC ARCH ANGIOGRAPHY;  Surgeon: Tonny Bollman, MD;  Location: [facility] INVASIVE CV LAB;  Service: Cardiovascular;  Laterality: N/A;   BREAST SURGERY Right    "clamped blood vessel and stitched it up; in [place]"   CARDIAC CATHETERIZATION     CARDIAC VALVE REPLACEMENT  01/26/2013   "cow valve put in; widened the second valve" (02/25/2013) - [place], [place]   CATARACT EXTRACTION W/ INTRAOCULAR LENS  IMPLANT, BILATERAL Bilateral 04/2017   CHOLECYSTECTOMY OPEN  1971   CYST EXCISION     "taken off my chest"   INSERT / REPLACE / REMOVE PACEMAKER  01/19/2013   [employer] - dual chamber   LAPAROSCOPIC GASTRIC SLEEVE RESECTION  2009   in [place]   PPM GENERATOR  CHANGEOUT N/A 06/22/2022   Procedure: PPM GENERATOR CHANGEOUT;  Surgeon: Duke Salvia, MD;  Location: [facility] INVASIVE CV LAB;  Service: Cardiovascular;  Laterality: N/A;   RIGHT HEART CATH N/A 04/17/2023   Procedure: RIGHT HEART CATH;  Surgeon: Kathleene Hazel, MD;  Location: [facility] INVASIVE CV LAB;  Service: Cardiovascular;  Laterality: N/A;   RIGHT/LEFT HEART CATH AND CORONARY ANGIOGRAPHY N/A 08/07/2021   Procedure: RIGHT/LEFT HEART CATH AND CORONARY ANGIOGRAPHY;  Surgeon: Tonny Bollman, MD;  Location: [facility] INVASIVE CV LAB;   Service: Cardiovascular;  Laterality: N/A;   TEE WITHOUT CARDIOVERSION N/A 06/13/2021   Procedure: TRANSESOPHAGEAL ECHOCARDIOGRAM (TEE);  Surgeon: Thurmon Fair, MD;  Location: [facility] ENDOSCOPY;  Service: Cardiovascular;  Laterality: N/A;   VEIN SURGERY Left    LLE; cut leg open and took out piece of vein that was protruding thru skin; stitched it up; done in [place]     A IV Location/Drains/Wounds Patient Lines/Drains/Airways Status     Active Line/Drains/Airways     Name Placement date Placement time Site Days   Peripheral IV 05/01/23 20 G 1.88" Posterior;Left;Lateral Forearm 05/01/23  0930  Forearm  less than 1   Peripheral IV 05/01/23 20 G 1.88" Posterior;Right;Lateral Forearm 05/01/23  0955  Forearm  less than 1   External Urinary Catheter 05/01/23  1435  --  less than 1   Wound / Incision (Open or Dehisced) 06/21/22 Irritant Dermatitis (Moisture Associated Skin Damage) Groin Anterior;Bilateral 06/21/22  2100  Groin  314   Wound / Incision (Open or Dehisced) 04/12/23 Other (Comment) Leg Right partial thickness wound 04/12/23  --  Leg  19   Wound / Incision (Open or Dehisced) 04/17/23 Irritant Dermatitis (Moisture Associated Skin Damage) Buttocks Right;Left 04/17/23  1330  Buttocks  14            Intake/Output Last 24 hours  Intake/Output Summary (Last 24 hours) at 05/01/2023 1557 Last data filed at 05/01/2023 0848 Gross per 24 hour  Intake 315 ml  Output --  Net 315 ml    Labs/Imaging Results for orders placed or performed during the hospital encounter of 04/30/23 (from the past 48 hours)  Type and screen [facility]     Status: None (Preliminary result)   Collection Time: 04/30/23  8:03 PM  Result Value Ref Range   ABO/RH(D) A POS    Antibody Screen NEG    Sample Expiration 05/03/2023,2359    Unit Number [id number]    Blood Component Type RED CELLS,LR    Unit division 00    Status of Unit ISSUED    Transfusion Status OK TO TRANSFUSE     Crossmatch Result Compatible    Unit Number [id number]    Blood Component Type RED CELLS,LR    Unit division 00    Status of Unit ISSUED    Transfusion Status OK TO TRANSFUSE    Crossmatch Result      Compatible Performed at [facility] Lab, [address], [place], [place] [address]   Comprehensive metabolic panel     Status: Abnormal   Collection Time: 04/30/23  8:06 PM  Result Value Ref Range   Sodium 140 135 - 145 mmol/L   Potassium 3.6 3.5 - 5.1 mmol/L   Chloride 102 98 - 111 mmol/L   CO2 27 22 - 32 mmol/L   Glucose, Bld 91 70 - 99 mg/dL    Comment: Glucose reference range applies only to samples taken after fasting for at least 8 hours.  BUN 71 (H) 8 - 23 mg/dL   Creatinine, Ser 1.61 (H) 0.44 - 1.00 mg/dL   Calcium 9.0 8.9 - 09.6 mg/dL   Total Protein 6.0 (L) 6.5 - 8.1 g/dL   Albumin 2.8 (L) 3.5 - 5.0 g/dL   AST 19 15 - 41 U/L   ALT 16 0 - 44 U/L   Alkaline Phosphatase 72 38 - 126 U/L   Total Bilirubin 1.2 (H) <1.2 mg/dL   GFR, Estimated 25 (L) >60 mL/min    Comment: (NOTE) Calculated using the CKD-EPI Creatinine Equation (2021)    Anion gap 11 5 - 15    Comment: Performed at [facility], [address] [place], [place] [address]  CBC with Differential     Status: Abnormal   Collection Time: 04/30/23  8:06 PM  Result Value Ref Range   WBC 9.5 4.0 - 10.5 K/uL   RBC 2.53 (L) 3.87 - 5.11 MIL/uL   Hemoglobin 7.4 (L) 12.0 - 15.0 g/dL   HCT 98.1 (L) 19.1 - 47.8 %   MCV 100.0 80.0 - 100.0 fL   MCH 29.2 26.0 - 34.0 pg   MCHC 29.2 (L) 30.0 - 36.0 g/dL   RDW 29.5 (H) 62.1 - 30.8 %   Platelets 261 150 - 400 K/uL   nRBC 0.0 0.0 - 0.2 %   Neutrophils Relative % 72 %   Neutro Abs 6.8 1.7 - 7.7 K/uL   Lymphocytes Relative 17 %   Lymphs Abs 1.6 0.7 - 4.0 K/uL   Monocytes Relative 8 %   Monocytes Absolute 0.8 0.1 - 1.0 K/uL   Eosinophils Relative 2 %   Eosinophils Absolute 0.2 0.0 - 0.5 K/uL   Basophils Relative 0 %   Basophils Absolute 0.0 0.0 - 0.1 K/uL    Immature Granulocytes 1 %   Abs Immature Granulocytes 0.09 (H) 0.00 - 0.07 K/uL    Comment: Performed at [facility], [address], [place], [place] [address]  Protime-INR     Status: Abnormal   Collection Time: 04/30/23  8:06 PM  Result Value Ref Range   Prothrombin Time 21.6 (H) 11.4 - 15.2 seconds   INR 1.9 (H) 0.8 - 1.2    Comment: (NOTE) INR goal varies based on device and disease states. Performed at [facility], [address], [place], [place] [address]   Troponin I (High Sensitivity)     Status: Abnormal   Collection Time: 04/30/23  8:06 PM  Result Value Ref Range   Troponin I (High Sensitivity) 57 (H) <18 ng/L    Comment: (NOTE) Elevated high sensitivity troponin I (hsTnI) values and significant  changes across serial measurements may suggest ACS but many other  chronic and acute conditions are known to elevate hsTnI results.  Refer to the "Links" section for chest pain algorithms and additional  guidance. Performed at [facility], [address], [place], [place] [address]   I-stat chem 8, ED (not at [facility] or [facility])     Status: Abnormal   Collection Time: 04/30/23  8:11 PM  Result Value Ref Range   Sodium 138 135 - 145 mmol/L   Potassium 3.6 3.5 - 5.1 mmol/L   Chloride 101 98 - 111 mmol/L   BUN 71 (H) 8 - 23 mg/dL   Creatinine, Ser 3.24 (H) 0.44 - 1.00 mg/dL   Glucose, Bld 86 70 - 99 mg/dL    Comment: Glucose reference range applies only to samples  taken after fasting for at least 8 hours.   Calcium, Ion 1.12 (L) 1.15 - 1.40 mmol/L   TCO2 27 22 - 32 mmol/L   Hemoglobin 7.5 (L) 12.0 - 15.0 g/dL   HCT 44.0 (L) 10.2 - 72.5 %  POC occult blood, ED Provider will collect     Status: Abnormal   Collection Time: 04/30/23 10:34 PM  Result Value Ref Range   Fecal Occult Bld POSITIVE (A) NEGATIVE  Troponin I (High Sensitivity)     Status: Abnormal   Collection Time: 04/30/23 11:12 PM  Result Value Ref Range   Troponin I (High Sensitivity) 52  (H) <18 ng/L    Comment: (NOTE) Elevated high sensitivity troponin I (hsTnI) values and significant  changes across serial measurements may suggest ACS but many other  chronic and acute conditions are known to elevate hsTnI results.  Refer to the "Links" section for chest pain algorithms and additional  guidance. Performed at [facility], [address], [place], [place] [address]   Hemoglobin and hematocrit, blood     Status: Abnormal   Collection Time: 05/01/23  4:15 AM  Result Value Ref Range   Hemoglobin 6.9 (LL) 12.0 - 15.0 g/dL    Comment: REPEATED TO VERIFY THIS CRITICAL RESULT HAS VERIFIED AND BEEN CALLED TO H.ORE RN BY GLENDA GANADEN ON 12 18 2024 AT 0439, AND HAS BEEN READ BACK.     HCT 22.9 (L) 36.0 - 46.0 %    Comment: Performed at [facility], [address] [place], [place] [address]  Basic metabolic panel     Status: Abnormal   Collection Time: 05/01/23  4:15 AM  Result Value Ref Range   Sodium 138 135 - 145 mmol/L   Potassium 4.5 3.5 - 5.1 mmol/L    Comment: HEMOLYSIS AT THIS LEVEL MAY AFFECT RESULT DELTA CHECK NOTED    Chloride 103 98 - 111 mmol/L   CO2 25 22 - 32 mmol/L   Glucose, Bld 178 (H) 70 - 99 mg/dL    Comment: Glucose reference range applies only to samples taken after fasting for at least 8 hours.   BUN 72 (H) 8 - 23 mg/dL   Creatinine, Ser 2.59 (H) 0.44 - 1.00 mg/dL   Calcium 8.2 (L) 8.9 - 10.3 mg/dL   GFR, Estimated 25 (L) >60 mL/min    Comment: (NOTE) Calculated using the CKD-EPI Creatinine Equation (2021)    Anion gap 10 5 - 15    Comment: Performed at [facility], [address], [place], [place] [address]  Magnesium     Status: None   Collection Time: 05/01/23  4:15 AM  Result Value Ref Range   Magnesium 2.0 1.7 - 2.4 mg/dL    Comment: Performed at [facility], [address] [place], [place] [address]  Brain natriuretic peptide     Status: Abnormal   Collection Time: 05/01/23  4:15 AM  Result Value Ref  Range   B Natriuretic Peptide 697.0 (H) 0.0 - 100.0 pg/mL    Comment: Performed at [facility], [address], [place], [place] [address]  Prepare RBC (crossmatch)     Status: None   Collection Time: 05/01/23  5:06 AM  Result Value Ref Range   Order Confirmation      ORDER PROCESSED BY BLOOD BANK Performed at [facility], [address] [place], [place] [address]   CBG monitoring, ED     Status:  Abnormal   Collection Time: 05/01/23  6:49 AM  Result Value Ref Range   Glucose-Capillary 161 (H) 70 - 99 mg/dL    Comment: Glucose reference range applies only to samples taken after fasting for at least 8 hours.  Prepare RBC (crossmatch)     Status: None   Collection Time: 05/01/23  9:00 AM  Result Value Ref Range   Order Confirmation      ORDER PROCESSED BY BLOOD BANK Performed at [facility], [address] [place], [place] [address]   CBG monitoring, ED     Status: Abnormal   Collection Time: 05/01/23 12:50 PM  Result Value Ref Range   Glucose-Capillary 150 (H) 70 - 99 mg/dL    Comment: Glucose reference range applies only to samples taken after fasting for at least 8 hours.  Procalcitonin     Status: None   Collection Time: 05/01/23  2:33 PM  Result Value Ref Range   Procalcitonin 0.15 ng/mL    Comment:        Interpretation: PCT (Procalcitonin) <= 0.5 ng/mL: Systemic infection (sepsis) is not likely. Local bacterial infection is possible. (NOTE)       Sepsis PCT Algorithm           Lower Respiratory Tract                                      Infection PCT Algorithm    ----------------------------     ----------------------------         PCT < 0.25 ng/mL                PCT < 0.10 ng/mL          Strongly encourage             Strongly discourage   discontinuation of antibiotics    initiation of antibiotics    ----------------------------     -----------------------------       PCT 0.25 - 0.50 ng/mL            PCT 0.10 - 0.25 ng/mL               OR        >80% decrease in PCT            Discourage initiation of                                            antibiotics      Encourage discontinuation           of antibiotics    ----------------------------     -----------------------------         PCT >= 0.50 ng/mL              PCT 0.26 - 0.50 ng/mL               AND        <80% decrease in PCT             Encourage initiation of                                             antibiotics  Encourage continuation           of antibiotics    ----------------------------     -----------------------------        PCT >= 0.50 ng/mL                  PCT > 0.50 ng/mL               AND         increase in PCT                  Strongly encourage                                      initiation of antibiotics    Strongly encourage escalation           of antibiotics                                     -----------------------------                                           PCT <= 0.25 ng/mL                                                 OR                                        > 80% decrease in PCT                                      Discontinue / Do not initiate                                             antibiotics  Performed at [facility], [address] [place], [place] [address]   CK     Status: Abnormal   Collection Time: 05/01/23  2:33 PM  Result Value Ref Range   Total CK 18 (L) 38 - 234 U/L    Comment: Performed at [facility], [address], [place], [place] [address]  Hemoglobin and hematocrit, blood     Status: Abnormal   Collection Time: 05/01/23  2:34 PM  Result Value Ref Range   Hemoglobin 9.9 (L) 12.0 - 15.0 g/dL    Comment: REPEATED TO VERIFY POST TRANSFUSION SPECIMEN    HCT 31.6 (L) 36.0 - 46.0 %    Comment: Performed at [facility], [address] [place], [place] [address]   CT CHEST ABDOMEN PELVIS WO CONTRAST Result Date: 04/30/2023 CLINICAL DATA:  Chest pain.  Back pain.  Hemoglobin  drop. EXAM: CT CHEST, ABDOMEN AND PELVIS WITHOUT CONTRAST TECHNIQUE: Multidetector CT imaging of the chest, abdomen and pelvis was performed following the standard  protocol without IV contrast. RADIATION DOSE REDUCTION: This exam was performed according to the departmental dose-optimization program which includes automated exposure control, adjustment of the mA and/or kV according to patient size and/or use of iterative reconstruction technique. COMPARISON:  CT 07/06/2021 FINDINGS: CT CHEST FINDINGS Cardiovascular: Left-sided pacemaker in place. Heart is normal in size. Coronary artery calcifications. Prosthetic aortic valve. Advanced aortic atherosclerosis. Dilated descending aorta measuring 3.7 cm post median sternotomy. No pericardial effusion. Decreased density of the blood pool suggestive of anemia. Mediastinum/Nodes: Mildly enlarged and borderline mediastinal lymph nodes, including a 14 mm anterior paratracheal node series 2, image 20. Hilar assessment is limited in the absence of IV contrast. Small to moderate-sized hiatal hernia. Lungs/Pleura: Moderate to advanced multifocal lung opacities with ground-glass and nodular configuration. Airspace disease obscures the area of previous left lower lobe nodule. Small left and small to moderate right pleural effusion, simple fluid density. Consolidation in the dependent right lower lobe may represent compressive atelectasis or infection. There is moderate right middle and lower lobe bronchial thickening. No endobronchial lesion. Musculoskeletal: Moderate thoracic spondylosis with diffuse anterior spurring. Prior median sternotomy. There are no acute or suspicious osseous abnormalities. CT ABDOMEN PELVIS FINDINGS Hepatobiliary: Unremarkable unenhanced appearance of the liver. Clips in the gallbladder fossa postcholecystectomy. No biliary dilatation. Pancreas: Parenchymal atrophy. No ductal dilatation or inflammation. Spleen: Normal in size without focal abnormality.  Adrenals/Urinary Tract: No adrenal nodule. No hydronephrosis. No renal calculi. No evidence of focal renal abnormality. Decompressed ureters. Nondistended urinary bladder. Stomach/Bowel: Postsurgical change in the greater curvature of the stomach moderate-sized hiatal hernia. The appendix is not definitively seen. No evidence of bowel wall thickening, distention, or inflammatory changes. Small to moderate volume of colonic stool. Vascular/Lymphatic: Advanced aortic and branch atherosclerosis. Retroaortic left renal vein. No evidence of retroperitoneal hematoma. No adenopathy. Reproductive: Uterus and bilateral adnexa are unremarkable. Other: Broad-based rectus diastasis. Small fat containing umbilical hernia. 2.3 x 1.2 cm hyperdensity in the right lateral abdominal wall musculature, series 2, image 83. This is new from prior exam. Scattered gluteal granulomas are again seen. No significant ascites. No free air. Musculoskeletal: Moderate diffuse lumbar degenerative change. Scattered Schmorl's nodes. Chronic compression deformity of L5. Undulation of inferior L2 vertebral body is new from prior. IMPRESSION: 1. Moderate to advanced multifocal lung opacities with ground-glass and nodular configuration, suspicious for multifocal pneumonia. Airspace nodularity obscures the previous left lower lobe pulmonary nodule. 2. Small left and small to moderate right pleural effusions. Consolidation in the dependent right lower lobe may represent compressive atelectasis or infection. 3. Mildly enlarged and borderline mediastinal lymph nodes, likely reactive. 4. Undulation of inferior L2 vertebral body is new from prior exam, suspicious for subacute or acute compression fracture. Recommend correlation with focal tenderness. 5. A 2.3 x 1.2 cm hyperdensity in the right lateral abdominal wall musculature is new from prior exam, suspicious for small intramuscular abdominal wall hematoma. Aortic Atherosclerosis (ICD10-I70.0).  Electronically Signed   By: Narda Rutherford M.D.   On: 04/30/2023 22:45    Pending Labs Unresulted Labs (From admission, onward)     Start     Ordered   05/02/23 0500  CBC  Tomorrow morning,   R        05/01/23 1215   05/02/23 0500  Basic metabolic panel  Tomorrow morning,   R        05/01/23 1215   05/02/23 0500  Magnesium  Tomorrow morning,   R        05/01/23 1215   05/01/23  1315  ANA, IFA (with reflex)  (Autoimmune Panel)  Once,   R        05/01/23 1314   05/01/23 1315  ANCA Profile  (Autoimmune Panel)  Once,   R        05/01/23 1314   05/01/23 1315  Rheumatoid factor  (Autoimmune Panel)  Once,   R        05/01/23 1314   05/01/23 1315  CYCLIC CITRUL PEPTIDE ANTIBODY, IGG/IGA  (Autoimmune Panel)  Once,   R        05/01/23 1314   05/01/23 1315  Sedimentation rate  (Autoimmune Panel)  Once,   R        05/01/23 1314   05/01/23 1315  C-reactive protein  (Autoimmune Panel)  Once,   R        05/01/23 1314   05/01/23 1315  C3 complement  (Autoimmune Panel)  Once,   R        05/01/23 1314   05/01/23 1315  C4 complement  (Autoimmune Panel)  Once,   R        05/01/23 1314   05/01/23 1315  Aldolase  (Autoimmune Panel)  Once,   R        05/01/23 1314   05/01/23 1315  Anti-scleroderma antibody  (Autoimmune Panel)  Once,   R        05/01/23 1314   05/01/23 1315  Ferritin  (Autoimmune Panel)  Once,   R        05/01/23 1314   05/01/23 1315  Hypersensitivity Pneumonitis  Once,   R        05/01/23 1314   05/01/23 0639  Urinalysis, Routine w reflex microscopic -Urine, Clean Catch  Once,   R       Question:  Specimen Source  Answer:  Urine, Clean Catch   05/01/23 0638            Vitals/Pain Today's Vitals   05/01/23 1239 05/01/23 1245 05/01/23 1300 05/01/23 1400  BP: (!) 146/45 (!) 154/132 112/81 (!) 151/55  Pulse: 64 63 61 73  Resp: (!) 22 20 20 19   Temp: (!) 97 F (36.1 C)     TempSrc: Oral     SpO2: 100% 100% 100% 99%  Weight:      Height:      PainSc:         Isolation Precautions No active isolations  Medications Medications  atorvastatin (LIPITOR) tablet 40 mg (40 mg Oral Given 05/01/23 1255)  carvedilol (COREG) tablet 3.125 mg (3.125 mg Oral Given 05/01/23 1257)  FLUoxetine (PROZAC) capsule 40 mg (40 mg Oral Given 05/01/23 1257)  traZODone (DESYREL) tablet 25 mg (has no administration in time range)  levothyroxine (SYNTHROID) tablet 75 mcg (75 mcg Oral Given 05/01/23 0658)  sucralfate (CARAFATE) tablet 1 g (1 g Oral Given 05/01/23 1256)  cholecalciferol (VITAMIN D3) 25 MCG (1000 UNIT) tablet 1,000 Units (1,000 Units Oral Given 05/01/23 1256)  mometasone-formoterol (DULERA) 200-5 MCG/ACT inhaler 2 puff (2 puffs Inhalation Given 05/01/23 1400)  guaiFENesin (MUCINEX) 12 hr tablet 600 mg (600 mg Oral Given 05/01/23 1256)  ipratropium (ATROVENT) 0.06 % nasal spray 2 spray (2 sprays Each Nare Given 05/01/23 1400)  gabapentin (NEURONTIN) capsule 600 mg (has no administration in time range)  insulin aspart (novoLOG) injection 0-9 Units (2 Units Subcutaneous Given 05/01/23 0658)  hydrALAZINE (APRESOLINE) tablet 25 mg (25 mg Oral Given 05/01/23 0658)  isosorbide mononitrate (IMDUR) 24 hr tablet 15 mg (  15 mg Oral Given 05/01/23 1256)  ipratropium-albuterol (DUONEB) 0.5-2.5 (3) MG/3ML nebulizer solution 3 mL (has no administration in time range)  pantoprazole (PROTONIX) injection 40 mg (40 mg Intravenous Given 05/01/23 1255)  azithromycin (ZITHROMAX) 500 mg in sodium chloride 0.9 % 250 mL IVPB (has no administration in time range)  cefTRIAXone (ROCEPHIN) 2 g in sodium chloride 0.9 % 100 mL IVPB (has no administration in time range)  acetaminophen (TYLENOL) tablet 650 mg (650 mg Oral Given 05/01/23 1256)  calcitonin (salmon) (MIACALCIN/FORTICAL) nasal spray 1 spray (has no administration in time range)  cefTRIAXone (ROCEPHIN) 1 g in sodium chloride 0.9 % 100 mL IVPB (0 g Intravenous Stopped 05/01/23 0004)  azithromycin (ZITHROMAX) 500 mg in sodium  chloride 0.9 % 250 mL IVPB (0 mg Intravenous Stopped 05/01/23 0105)  pantoprazole (PROTONIX) injection 40 mg (40 mg Intravenous Given 05/01/23 0108)  0.9 %  sodium chloride infusion (Manually program via Guardrails IV Fluids) ( Intravenous New Bag/Given 05/01/23 1027)  furosemide (LASIX) injection 60 mg (60 mg Intravenous Given 05/01/23 1255)    Mobility non-ambulatory     Focused Assessments     R Recommendations: See Admitting Provider Note  Report given to:   Additional Notes: Call or message with any questions.

## 2023-05-01 NOTE — Assessment & Plan Note (Deleted)
QTcB 510 and receiving pantoprazole 40 mg IV BID.  However, not on any other QT prolonging meds for pantoprazole to interact with currently and benefits outweigh risks in setting of suspected UGIB. -f/u repeat EKG -Avoid QT prolonging meds

## 2023-05-01 NOTE — Assessment & Plan Note (Deleted)
Creatinine improved from discharge 12/11, but above former baseline ~1.5-1.7.  Possibly prerenal given BUN/Cr ratio >20. -Still holding Farxiga from last admission, was not restarted outpatient -Use caution with fluids given HFrEF -Avoid nephrotoxic medications, caution with contrast

## 2023-05-01 NOTE — Progress Notes (Signed)
OT Cancellation Note  Patient Details Name: Tonya Glass MRN: [id number] DOB: [birth year]   Cancelled Treatment:    Reason Eval/Treat Not Completed: Medical issues which prohibited therapy Patient in the process of having an additional IV placed, and about to start a unit of blood. OT will follow back to complete evaluation as time permits.   Pollyann Glen E. Kynnadi Dicenso, OTR/L Acute Rehabilitation Services [phone]   Cherlyn Cushing 05/01/2023, 10:04 AM

## 2023-05-01 NOTE — Consult Note (Addendum)
WOC Nurse Consult Note: Reason for Consult: Consult requested for bilat leg wraps.  Pt is familiar to [facility] team from a previous admission on 11/29 and has been wearing light compression wraps for edema control prior to admission. Left leg without edema, erythremia, or drainage when wraps removed.  No open wounds. Right leg without edema, erythremia, or drainage when wraps removed.  Partial thickness wound to right outer upper calf, 2X2X.1cm, red and dry. No further need for compression at this time. Topical treatment orders provided for bedside nurses to perform as follows: Foam dressing to right outer leg.  Change Q 3 days or PRN soiling. Please re-consult if further assistance is needed.  Thank-you,  Cammie Mcgee MSN, RN, CWOCN, [place], CNS [phone]

## 2023-05-01 NOTE — Assessment & Plan Note (Signed)
Do not favor acute exacerbation, though does have productive cough. -Titrate O2 to sat goal 88-92% (4 L with exertion at home) -DuoNebs every 4 hours PRN -Continue Dulera 2 puffs BID (Symbicort formulary equivalent) -Continue guaifenesin 600 mg BID -Nightly BiPAP, no official OSA diagnosis

## 2023-05-01 NOTE — Assessment & Plan Note (Signed)
Will hold Eliquis 5 mg BID given suspected UGIB, restart per GI recs. -Follow up TTE to evaluate status of LV apical thrombus previously noted on TTE 11/28 -Consult Cardiology in AM -AM BMP; goal K>4, mag>2

## 2023-05-01 NOTE — Hospital Course (Addendum)
Tonya Glass is a 77 y.o. year old with a history of HFpEF, hypertension, sick sinus syndrome status post ICD, aortic stenosis status post valve replacement, CKD 3B, hypothyroidism, COPD, type 2 diabetes who presented for weakness and back pain and was found to be anemic with hemoglobin to 6.9 and positive FOBT.  Acute blood loss anemia Patient received 2 units of PRBCs with appropriate response GI was consulted given positive FOBT, who recommended EGD.  Patient was started on PPI and Eliquis was held in setting of acute bleed.  EGD done on 12/20 which showed 4 cm hiatal hernia with to 2 mm angio ectasia was, 1 which was bleeding.  Full duration to stop bleeding by argon plasma was successful.  GI recommended PPI twice daily for 4 weeks and daily.  Patient's hemoglobin remained stable for 48 hours , so she was started back on Eliquis***  Acute CHF exacerbation BNP with elevation and signs of volume overload on exam.  Patient was recently discharged on torsemide 60 mg twice daily and seems to been compliant on this.  Cardiology was consulted given concern for worsening of heart failure.  Patient was diuresed with 60 mg IV Lasix for two days.  Cardiology recommended***  Lumbar compression fracture L2 fracture seen on imaging.  Discussed with neurosurgery who believes this may be an old fracture and did not recommend any intervention at this time.  Patient was given nasal calcitonin for pain control  Multifocal pneumonia Imaging showed concern for multifocal pneumonia and patient describes recent cough in setting of COPD.  Patient was afebrile without leukocytosis and recently was treated for hap, thus pulmonology was consulted for possible underlying pathology.  Pulmonology believed likely multifocal pneumonia, though investigated autoimmune causes.  Lab work showed***.  Speech eval was conducted for concern of aspiration pneumonitis.  Patient declined barium swallow.  Goals of Care Given patient has  had multiple admissions and multiple ED visits with high disease burden, palliative was consulted to discuss goals of care with patient and family.  Other chronic conditions were medically managed with home medications and formulary alternatives as necessary (***).  PCP Follow-up Recommendations: Look into sleep study for possible OSA/OHS, Cardiology previously recommended outpatient

## 2023-05-01 NOTE — Consult Note (Signed)
Cardiology Consultation   Patient ID: Tonya Glass MRN: [id number]; DOB: 13-Jan-1946  Admit date: 04/30/2023 Date of Consult: 05/01/2023  PCP:  No primary care provider on file.   [facility] Providers Cardiologist:  Tonya Nose, MD / Dr. Gevena Glass here to update MD or APP on Care Team, Refresh:1}     Patient Profile:   Tonya Glass is a 77 y.o. 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  History of Present Illness:   Tonya Glass carries the extremely complex history as above. At last OV with Tonya Glass 03/2023 he noted concern of her unsteadiness, progressive memory decline and debilitation, requiring assistance for any movement. She was recently admitted 04/10/23 - 04/24/23 with worsening DOE, orthopnea, 30lb weight gain. She was admitted with acute HFrEF (EF 35-40% previously 55-60 in 05/2021), AKI on CKD, HAP, progressive anemia, and small LV thrombus. She received IV Lasix and metolazone earlier in the admission but then required holding of her diuretics in the setting of her AKI. Her Farxiga and olmesartan were also held. She was felt to be a poor candidate for invasive evaluation given her AKI and comorbidities. It was felt that subsequent testing could be reconsidered  if renal function significantly improved. F/u RHC to help clarify volume status showed normal right sided filling pressures and did not suggest significant pulmonary HTN, therefore her residual significant LE edema was not felt to be cardiac in etiology.  Nephrology subsequently took over management of the diuretics. These were held, then torsemide subsequently restarted at lower dose at discharge at 60mg  BID (was on 100mg  BID PTA). She also had anemia during that admission felt likely secondary to chronic illness, treated with IV iron and blood transfusion. She was discharged to SNF. D/C Cr 2.52, BUN 50's at that time.   She returned to the hospital with weakness, back pain and buttocks pain for several days. She was found to have multiple issues including progressive anemia Hgb 6.9, possible multifocal PNA, small left and moderate-to-right pleural effusions, lumbar compression fracture, and 2.3x1.2cm lateral abdominal wall hyperdensity suspicious for small intramuscular abdominal wall hematoma. Eliquis was held. She has been treated with transfusion. GI is considering EGD when patient is more stable from respiratory standpoint. She was also seen by pulmonary given the abnormal CT findings who are helping to guide PNA treatment. Labs show BUN 72, Cr 2.05, hsTroponin 57-52, FOBT +, BNP 697. Cardiology is consulted given patient's history of CHF. Per notes at one point she told someone she had had chest pain. She is a poor historian. She denies any complaints either past or present. She does not recall the details of her chest pain. She is oriented to place, self, month and year. Her edema looks better to me than it had when I met her the day of her RHC on 12/5. Weight was 213 day of RHC, 211 day of dc, and recorded at 211  yesterday on readmission. Weight not yet available today.  EKG on 12/17 was more abnormal than previous with unusual ST/TW appearance than prior. The team spoke with Tonya Glass earlier today  who was not acutely concerned. Per d/w Tonya Glass with EP, this may represent some fractionation of the pacing or potentially some fusion of her conduction which may subsequently skew the ST/TW segments. Her follow-up EKG looked more classically V paced. Telemetry shows atrial sensed V paced rhythm and A/V pacing at times, no sustained atrial fib or flutter seen. She was given 60mg  IV Lasix x1 today. She is having steady output from her purewick.  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   Migraines    "none in years til 01/2013-02/2013; none since"  (07/24/2017)   Orthopnoea    "just since open heart surgery" (02/25/2013)   Pacemaker    [employer]    Pneumonia 07/23/2017   Stroke (HCC) 2001   denies residual on 02/25/2013   Symptomatic hypotension 10/10/2016   Syncope and collapse    "prior to open heart surgery" (02/25/2013)   Type II diabetes mellitus (HCC)     Past Surgical History:  Procedure Laterality Date   AORTIC ARCH ANGIOGRAPHY N/A 08/07/2021   Procedure: AORTIC ARCH ANGIOGRAPHY;  Surgeon: Tonya Bollman, MD;  Location: [facility] INVASIVE CV LAB;  Service: Cardiovascular;  Laterality: N/A;   BREAST SURGERY Right    "clamped blood vessel and stitched it up; in [place]"   CARDIAC CATHETERIZATION     CARDIAC VALVE REPLACEMENT  01/26/2013   "cow valve put in; widened the second valve" (02/25/2013) - [place], [place]   CATARACT EXTRACTION W/ INTRAOCULAR LENS  IMPLANT, BILATERAL Bilateral 04/2017   CHOLECYSTECTOMY OPEN  1971   CYST EXCISION     "taken off  my chest"   INSERT / REPLACE / REMOVE PACEMAKER  01/19/2013   [employer] - dual chamber   LAPAROSCOPIC GASTRIC SLEEVE RESECTION  2009   in [place]   PPM GENERATOR CHANGEOUT N/A 06/22/2022   Procedure: PPM GENERATOR CHANGEOUT;  Surgeon: Duke Salvia, MD;  Location: [facility] INVASIVE CV LAB;  Service: Cardiovascular;  Laterality: N/A;   RIGHT HEART CATH N/A 04/17/2023   Procedure: RIGHT HEART CATH;  Surgeon: Kathleene Hazel, MD;  Location: [facility] INVASIVE CV LAB;  Service: Cardiovascular;  Laterality: N/A;   RIGHT/LEFT HEART CATH AND CORONARY ANGIOGRAPHY N/A 08/07/2021   Procedure: RIGHT/LEFT HEART CATH AND CORONARY ANGIOGRAPHY;  Surgeon: Tonya Bollman, MD;  Location: [facility] INVASIVE CV LAB;  Service: Cardiovascular;  Laterality: N/A;   TEE WITHOUT CARDIOVERSION N/A 06/13/2021   Procedure: TRANSESOPHAGEAL ECHOCARDIOGRAM (TEE);  Surgeon: Thurmon Fair, MD;  Location: [facility] ENDOSCOPY;  Service: Cardiovascular;  Laterality: N/A;   VEIN SURGERY Left    LLE; cut leg open and took out piece of vein that was protruding thru skin; stitched it up; done in [place]     Home Medications:  Prior to Admission medications   Medication Sig  Start Date End Date Taking? Authorizing Provider  acetaminophen (TYLENOL) 325 MG tablet Take 650 mg by mouth every 8 (eight) hours as needed for mild pain (pain score 1-3).   Yes [provider]  alendronate (FOSAMAX) 70 MG tablet Take 70 mg by mouth every Sunday. 07/13/17  Yes [provider]  Amino Acids-Protein Hydrolys (FEEDING SUPPLEMENT, PRO-STAT SUGAR FREE 64,) LIQD Take 30 mLs by mouth 2 (two) times daily.   Yes [provider]  apixaban (ELIQUIS) 5 MG TABS tablet Take 5 mg by mouth 2 (two) times daily.   Yes [provider]  atorvastatin (LIPITOR) 40 MG tablet Take 40 mg by mouth daily.   Yes [provider]  budesonide-formoterol (SYMBICORT) 160-4.5 MCG/ACT inhaler Inhale 2 puffs into the lungs 2 (two) times daily.   Yes  [provider]  carvedilol (COREG) 3.125 MG tablet Take 1 tablet (3.125 mg total) by mouth 2 (two) times daily. 06/23/22  Yes Jonita Albee, PA-C  Cholecalciferol 50 MCG (2000 UT) TABS Take 1 tablet by mouth daily.   Yes [provider]  Emollient (AQUAPHOR ADV THERAPY HEALING) 41 % OINT Apply 1 Application topically See admin instructions. Apply 1 application topically every shift, apply to sacrum wound.   Yes [provider]  Ferrous Sulfate (IRON) 325 (65 Fe) MG TABS Take 325 mg by mouth daily. 08/13/17  Yes [provider]  FLUoxetine (PROZAC) 40 MG capsule Take 40 mg by mouth daily.    Yes [provider]  gabapentin (NEURONTIN) 600 MG tablet Take 600 mg by mouth at bedtime. 04/24/23  Yes [provider]  guaiFENesin (MUCINEX) 600 MG 12 hr tablet Take 600 mg by mouth 2 (two) times daily.   Yes [provider]  HUMALOG KWIKPEN 100 UNIT/ML KwikPen Inject into the skin 2 (two) times daily. Sliding scale 03/11/23  Yes [provider]  hydrALAZINE (APRESOLINE) 25 MG tablet Take 1 tablet (25 mg total) by mouth every 8 (eight) hours. 04/23/23  Yes Elberta Fortis, MD  ipratropium (ATROVENT) 0.06 % nasal spray Place 2 sprays into both nostrils 3 (three) times daily. 12/14/21  Yes Charlott Holler, MD  isosorbide mononitrate (IMDUR) 30 MG 24 hr tablet Take 0.5 tablets (15 mg total) by mouth daily. 04/23/23  Yes Elberta Fortis, MD  LANTUS SOLOSTAR 100 UNIT/ML Solostar Pen Inject 6 Units into the skin at bedtime. 04/23/23  Yes Elberta Fortis, MD  levocetirizine (XYZAL) 5 MG tablet Take 5 mg by mouth at bedtime. 09/05/21  Yes [provider]  levothyroxine (SYNTHROID) 75 MCG tablet Take 75 mcg by mouth daily. 03/22/23  Yes [provider]  magnesium oxide (MAG-OX) 400 (240 Mg) MG tablet Take 400 mg by mouth daily.   Yes [provider]  [facility] powder Apply 1 Application topically 2 (two) times daily.  04/24/23  Yes [provider]  pantoprazole (PROTONIX) 40 MG tablet Take 40 mg by mouth 2 (two) times daily. 03/26/23  Yes [provider]  PROAIR RESPICLICK 108 (90 Base) MCG/ACT AEPB Inhale 2 puffs into the lungs 4 (four) times daily as needed. 02/19/23  Yes [provider]  sodium chloride (OCEAN) 0.65 % SOLN nasal spray Place 2 sprays into both nostrils every 6 (six) hours as needed for congestion.   Yes [provider]  sucralfate (CARAFATE) 1 g tablet Take 1 g by mouth 4 (four) times daily -  with meals and at bedtime.   Yes [provider]  torsemide [facility])  20 MG tablet Take 60 mg by mouth 2 (two) times daily. 04/30/23  Yes [provider]  traZODone (DESYREL) 50 MG tablet Take 0.5 tablets (25 mg total) by mouth at bedtime. 04/23/23  Yes Elberta Fortis, MD  Benzethonium Chloride 0.13 % LIQD Apply 1 Application topically 2 (two) times a week. On Tuesdays and Fridays    [provider]  nystatin ointment (MYCOSTATIN) Apply 1 application  topically daily as needed (rash). 100,000- units/gram    [provider]    Inpatient Medications: Scheduled Meds:  acetaminophen  650 mg Oral Q6H   atorvastatin  40 mg Oral Daily   calcitonin (salmon)  1 spray Alternating Nares Daily   carvedilol  3.125 mg Oral BID   cholecalciferol  1,000 Units Oral Daily   FLUoxetine  40 mg Oral Daily   gabapentin  600 mg Oral QHS   guaiFENesin  600 mg Oral BID   hydrALAZINE  25 mg Oral Q8H   insulin aspart  0-9 Units Subcutaneous Q6H   ipratropium  2 spray Each Nare TID   isosorbide mononitrate  15 mg Oral Daily   levothyroxine  75 mcg Oral Daily   mometasone-formoterol  2 puff Inhalation BID   pantoprazole (PROTONIX) IV  40 mg Intravenous BID   sucralfate  1 g Oral TID WC & HS   traZODone  25 mg Oral QHS   Continuous Infusions:  [START ON 05/02/2023] azithromycin (ZITHROMAX) 500 mg in sodium chloride 0.9 % 250 mL IVPB      cefTRIAXone (ROCEPHIN)  IV     PRN Meds: ipratropium-albuterol  Allergies:   No Known Allergies  Social History:   Social History   Socioeconomic History   Marital status: Widowed    Spouse name: Not on file   Number of children: 2   Years of education: dental sch   Highest education level: Not on file  Occupational History   Occupation: retired  Tobacco Use   Smoking status: Former    Current packs/day: 0.00    Average packs/day: 2.0 packs/day for 40.0 years (80.0 ttl pk-yrs)    Types: Cigarettes    Start date: 05/29/1959    Quit date: 05/29/1999    Years since quitting: 23.9   Smokeless tobacco: Never  Vaping Use   Vaping status: Never Used  Substance and Sexual Activity   Alcohol use: No   Drug use: No   Sexual activity: Yes  Other Topics Concern   Not on file  Social History Narrative   Not on file   Social Drivers of Health   Financial Resource Strain: Not on file  Food Insecurity: No Food Insecurity (04/11/2023)   Hunger Vital Sign    Worried About Running Out of Food in the Last Year: Never true    Ran Out of Food in the Last Year: Never true  Transportation Needs: No Transportation Needs (04/11/2023)   PRAPARE - Administrator, Civil Service (Medical): No    Lack of Transportation (Non-Medical): No  Physical Activity: Not on file  Stress: Not on file  Social Connections: Unknown (09/25/2021)   [facility]   Social Network    Social Network: Not on file  Intimate Partner Violence: Not At Risk (04/11/2023)   Humiliation, Afraid, Rape, and Kick questionnaire    Fear of Current or Ex-Partner: No    Emotionally Abused: No    Physically Abused: No    Sexually Abused: No  Family History:    Family History  Problem Relation Age of Onset   Lung cancer Mother    Heart Problems Father    Hypertension Brother    Colon polyps Brother    Skin cancer Brother    Asthma Brother    Colon polyps Daughter     Diabetes Maternal Aunt    Breast cancer Maternal Aunt        mets to liver   Diabetes Paternal Aunt      ROS:  Please see the history of present illness.  All other ROS reviewed and negative.     Physical Exam/Data:   Vitals:   05/01/23 1239 05/01/23 1245 05/01/23 1300 05/01/23 1400  BP: (!) 146/45 (!) 154/132 112/81 (!) 151/55  Pulse: 64 63 61 73  Resp: (!) 22 20 20 19   Temp: (!) 97 F (36.1 C)     TempSrc: Oral     SpO2: 100% 100% 100% 99%  Weight:      Height:        Intake/Output Summary (Last 24 hours) at 05/01/2023 1446 Last data filed at 05/01/2023 0848 Gross per 24 hour  Intake 315 ml  Output --  Net 315 ml      04/30/2023    7:46 PM 04/24/2023    7:23 AM 04/22/2023    4:28 AM  Last 3 Weights  Weight (lbs) 211 lb 10.3 oz 211 lb 10.3 oz 212 lb 1.3 oz  Weight (kg) 96 kg 96 kg 96.2 kg     Body mass index is 41.33 kg/m.  General: Pale chronically ill appearing WF in NAD, sleeping when I walked in, easily arousable Head: Normocephalic, atraumatic, sclera non-icteric, no xanthomas, nares are without discharge. Neck: Negative for carotid bruits. JVP not elevated. Lungs: Poor inspiratory effort with decreased BS throughout and no wheezing or rales. Breathing is unlabored on O2. Open wide mouth breathing when sleeping Heart: RRR S1 S2 2/6 SEM without rubs or gallops.  Abdomen: Soft, non-tender, non-distended with normoactive bowel sounds. No rebound/guarding. Extremities: No clubbing or cyanosis. Chronic appearing B/L edema with venous stasis changes. Distal pedal pulses are 2+ and equal bilaterally. Neuro: Alert and oriented to everything but actual date. However, only responds to direct questions when asked. Moves all extremities spontaneously. Psych:  Flat affect  EKG:  The EKG was personally reviewed and demonstrates:   1) V paced rhythm 66bpm with diffuse nonspecific STTW changes in setting of paced rhythm, looks atrial sensed in lead II.  2) F/u tracing  difficult to ascertain P waves in beginning of EKG but second half of EKG showed atrial senced V paced rhythm at 77bpm similar to prior baseline morphology.  Telemetry:  as per HPI  Relevant CV Studies: 2d echo 04/11/23  1. Left ventricular ejection fraction, by estimation, is 35 to 40%. The  left ventricle has moderately decreased function. The left ventricle  demonstrates regional wall motion abnormalities with hypokinesis of the  mid to distal inferoseptal wall and  distal anteroseptal wall; however, complete wall motion assessment limited  by poor visualization of endocardial borders and off axis angles. There is  a small LV apical thrombus.   2. Right ventricular systolic function is normal. The right ventricular  size is normal.   3. Left atrial size was mildly dilated.   4. The mitral valve is normal in structure. Mild mitral valve  regurgitation. No evidence of mitral stenosis.   5. Well seated bioprosthetic aortic valve with no paravalvular  regurgitation. Aortic valve mean gradient measures 19.0 mmHg.   6. The inferior vena cava is normal in size with greater than 50%  respiratory variability, suggesting right atrial pressure of 3 mmHg.   Laboratory Data:  High Sensitivity Troponin:   Recent Labs  Lab 04/30/23 2006 04/30/23 2312  TROPONINIHS 57* 52*     Chemistry Recent Labs  Lab 04/30/23 2006 04/30/23 2011 05/01/23 0415  NA 140 138 138  K 3.6 3.6 4.5  CL 102 101 103  CO2 27  --  25  GLUCOSE 91 86 178*  BUN 71* 71* 72*  CREATININE 2.05* 2.20* 2.04*  CALCIUM 9.0  --  8.2*  MG  --   --  2.0  GFRNONAA 25*  --  25*  ANIONGAP 11  --  10    Recent Labs  Lab 04/30/23 2006  PROT 6.0*  ALBUMIN 2.8*  AST 19  ALT 16  ALKPHOS 72  BILITOT 1.2*   Lipids No results for input(s): "CHOL", "TRIG", "HDL", "LABVLDL", "LDLCALC", "CHOLHDL" in the last 168 hours.  Hematology Recent Labs  Lab 04/30/23 2006 04/30/23 2011 05/01/23 0415  WBC 9.5  --   --   RBC 2.53*   --   --   HGB 7.4* 7.5* 6.9*  HCT 25.3* 22.0* 22.9*  MCV 100.0  --   --   MCH 29.2  --   --   MCHC 29.2*  --   --   RDW 17.3*  --   --   PLT 261  --   --    Thyroid No results for input(s): "TSH", "FREET4" in the last 168 hours.  BNP Recent Labs  Lab 05/01/23 0415  BNP 697.0*    DDimer No results for input(s): "DDIMER" in the last 168 hours.   Radiology/Studies:  CT CHEST ABDOMEN PELVIS WO CONTRAST Result Date: 04/30/2023 CLINICAL DATA:  Chest pain.  Back pain.  Hemoglobin drop. EXAM: CT CHEST, ABDOMEN AND PELVIS WITHOUT CONTRAST TECHNIQUE: Multidetector CT imaging of the chest, abdomen and pelvis was performed following the standard protocol without IV contrast. RADIATION DOSE REDUCTION: This exam was performed according to the departmental dose-optimization program which includes automated exposure control, adjustment of the mA and/or kV according to patient size and/or use of iterative reconstruction technique. COMPARISON:  CT 07/06/2021 FINDINGS: CT CHEST FINDINGS Cardiovascular: Left-sided pacemaker in place. Heart is normal in size. Coronary artery calcifications. Prosthetic aortic valve. Advanced aortic atherosclerosis. Dilated descending aorta measuring 3.7 cm post median sternotomy. No pericardial effusion. Decreased density of the blood pool suggestive of anemia. Mediastinum/Nodes: Mildly enlarged and borderline mediastinal lymph nodes, including a 14 mm anterior paratracheal node series 2, image 20. Hilar assessment is limited in the absence of IV contrast. Small to moderate-sized hiatal hernia. Lungs/Pleura: Moderate to advanced multifocal lung opacities with ground-glass and nodular configuration. Airspace disease obscures the area of previous left lower lobe nodule. Small left and small to moderate right pleural effusion, simple fluid density. Consolidation in the dependent right lower lobe may represent compressive atelectasis or infection. There is moderate right middle and  lower lobe bronchial thickening. No endobronchial lesion. Musculoskeletal: Moderate thoracic spondylosis with diffuse anterior spurring. Prior median sternotomy. There are no acute or suspicious osseous abnormalities. CT ABDOMEN PELVIS FINDINGS Hepatobiliary: Unremarkable unenhanced appearance of the liver. Clips in the gallbladder fossa postcholecystectomy. No biliary dilatation. Pancreas: Parenchymal atrophy. No ductal dilatation or inflammation. Spleen: Normal in size without focal abnormality. Adrenals/Urinary Tract: No adrenal nodule. No hydronephrosis. No renal calculi.  No evidence of focal renal abnormality. Decompressed ureters. Nondistended urinary bladder. Stomach/Bowel: Postsurgical change in the greater curvature of the stomach moderate-sized hiatal hernia. The appendix is not definitively seen. No evidence of bowel wall thickening, distention, or inflammatory changes. Small to moderate volume of colonic stool. Vascular/Lymphatic: Advanced aortic and branch atherosclerosis. Retroaortic left renal vein. No evidence of retroperitoneal hematoma. No adenopathy. Reproductive: Uterus and bilateral adnexa are unremarkable. Other: Broad-based rectus diastasis. Small fat containing umbilical hernia. 2.3 x 1.2 cm hyperdensity in the right lateral abdominal wall musculature, series 2, image 83. This is new from prior exam. Scattered gluteal granulomas are again seen. No significant ascites. No free air. Musculoskeletal: Moderate diffuse lumbar degenerative change. Scattered Schmorl's nodes. Chronic compression deformity of L5. Undulation of inferior L2 vertebral body is new from prior. IMPRESSION: 1. Moderate to advanced multifocal lung opacities with ground-glass and nodular configuration, suspicious for multifocal pneumonia. Airspace nodularity obscures the previous left lower lobe pulmonary nodule. 2. Small left and small to moderate right pleural effusions. Consolidation in the dependent right lower lobe may  represent compressive atelectasis or infection. 3. Mildly enlarged and borderline mediastinal lymph nodes, likely reactive. 4. Undulation of inferior L2 vertebral body is new from prior exam, suspicious for subacute or acute compression fracture. Recommend correlation with focal tenderness. 5. A 2.3 x 1.2 cm hyperdensity in the right lateral abdominal wall musculature is new from prior exam, suspicious for small intramuscular abdominal wall hematoma. Aortic Atherosclerosis (ICD10-I70.0). Electronically Signed   By: Narda Rutherford M.D.   On: 04/30/2023 22:45    Assessment and Plan:   1. Acute blood loss anemia/heme positive stools superimposed on chronic blood loss anemia, also with possible small abdominal wall hematoma - Eliquis on hold - transfusion/management per primary team/GI  2. Multifocal PNA with history of COPD, cor pulmonale, chronic respiratory failure on home O2 (2-4L), morbid obesity, suspected OSA with prior refusal of sleep study - pulmonary involved  3. CKD stage IV - had recent AKI requiring nephrology involvement last admission with peak Cr 1.72, settling to 2.52 at dc - Cr today 2.04, relatively stable  4. Chronic HFrEF with small LV thrombus (recently diagnosed decline in LVEF) - s/p diuresis last admission followed by AKI, with subsequent reassuring RHC 04/17/23 suggested that significant residual edema was noncardiac in nature. Nephrology followed and resumed torsemide at discharge at lower dose 60mg  BID - volume status appears relatively stable compared to the last time I saw her on 12/5. Edema actually looks a little better than that day. However, CT showed question of effusions - she is having good UOP with the IV Lasix she received earlier today - I suspect majority of presentation is driven by the above rather than congestive heart failure - as per last notes, was not felt to be good candidate for invasive evaluation, medical therapy recommended unless renal function  showed significant recovery. Now presenting with worsening anemia, remains a poor candidate to consider ischemic evaluation - continue Imdur, hydralazine, carvedilol - hold off ARB/spiro given recent renal trajectory - will review diuretic dosing with MD  5. Recently diagnosed small LV thrombus - was continued on Eliquis without burst dosing due to ongoing anemia issues - do not think repeat echo would change management since Eliquis having to be held pending stabilization of anemia anyway - the LV thrombus is not related to her atrial fibrillation - this phenomena is typically seen in the setting of LV dysfunction which was present last admission. TEE is not indicated  at this time  6. Chest pain with low/flat troponins suspected due to demand ischemia, mild CAD last by cath 07/2021 - difficult historian, hard to get a clear picture - last admission she was not felt to be a good candidate for invasive ischemic evaluation; this especially applies this admission given readmission with heme positive stools, abdominal wall hematoma, anemia - would focus on symptom control - I saw that repeat echo was listed as already cancelled by another provider; I do not think a repeat study would change management  7. Paroxysmal atrial flutter - maintaining NSR with atrial sensed V paced rhythm as well as intermittent AV pacing  8. L2 compression fracture - per IM notes, they discussed with neurosurgery who felt this might look old and no specific intervention needed  9. CHB s/p Bos Sci PPM 2014 - continue OP f/u  10. Aortic root repair and bioprosthetic AVR 2014 - stable, mean gradient 19 mmHg - known moderate AI perivalvular previously, not well seen on most recent echo  11. Hypoalbuminemia - per primary team  Remainder per primary team. Given recent trajectory with readmissions for multiple competing medical problems would consider GOC consultation. Patient has h/o memory loss and is not a ready  participant in conversation today so this would best be served in a family setting.   Risk Assessment/Risk Scores:        New York Heart Association (NYHA) Functional Class NYHA Class III  CHA2DS2-VASc Score = 9   This indicates a 12.2% annual risk of stroke. The patient's score is based upon: CHF History: 1 HTN History: 1 Diabetes History: 1 Stroke History: 2 Vascular Disease History: 1 Age Score: 2 Gender Score: 1         For questions or updates, please contact [facility] Please consult [email or web address] for contact info under    Signed, Laurann Montana, PA-C  05/01/2023 2:46 PM

## 2023-05-01 NOTE — Assessment & Plan Note (Deleted)
A1c 6.0, discharged on 6 units LAI daily and SSI. -CBGs Q6h while NPO -No LAI while NPO, dose as needed based on CBGs with goal 140-180 -Sensitive SSI -Still holding Farxiga from last admission as noted above

## 2023-05-01 NOTE — Assessment & Plan Note (Signed)
Suspect acute fracture d/t osteoporosis in setting of physical therapy.  Currently wearing TLSO brace and pain is minimal. -Pain regimen: Acetaminophen 650 mg Q6h scheduled, consider opiates based on AM eval -Will benefit from RD eval and nutritional supplementation to aid recovery once medically stable -Nursing order for activity as tolerated with brace -PT/OT eval and treat -Consider Neurosurgery follow up

## 2023-05-01 NOTE — Assessment & Plan Note (Signed)
EF 35-40%, discharged on torsemide 60 mg BID.  Appears volume overloaded with JVD on exam. -Use caution with fluids and PRBCs, Lasix 20 mg IV ordered following transfusion -Dose diuretics in AM following GI evaluation and PRBC transfusion -Strict I&Os, daily weights -Continue carvedilol 3.125 mg BID -Irbesartan and Farxiga held last admission in setting of AKI, not yet restarted outpatient, likely will need as GDMT -Cardiology consult in AM

## 2023-05-02 ENCOUNTER — Encounter (HOSPITAL_COMMUNITY)
Admission: EM | Disposition: A | Discharge status: Skilled Nursing Facility | Payer: Self-pay | Source: Home / Self Care | Attending: Family Medicine

## 2023-05-02 ENCOUNTER — Inpatient Hospital Stay (HOSPITAL_COMMUNITY): Payer: 59

## 2023-05-02 DIAGNOSIS — K921 Melena: Secondary | ICD-10-CM

## 2023-05-02 DIAGNOSIS — D649 Anemia, unspecified: Secondary | ICD-10-CM | POA: Diagnosis not present

## 2023-05-02 DIAGNOSIS — I5022 Chronic systolic (congestive) heart failure: Secondary | ICD-10-CM | POA: Diagnosis not present

## 2023-05-02 DIAGNOSIS — I513 Intracardiac thrombosis, not elsewhere classified: Secondary | ICD-10-CM

## 2023-05-02 DIAGNOSIS — S32020A Wedge compression fracture of second lumbar vertebra, initial encounter for closed fracture: Secondary | ICD-10-CM

## 2023-05-02 DIAGNOSIS — S31000A Unspecified open wound of lower back and pelvis without penetration into retroperitoneum, initial encounter: Secondary | ICD-10-CM | POA: Insufficient documentation

## 2023-05-02 DIAGNOSIS — J189 Pneumonia, unspecified organism: Secondary | ICD-10-CM | POA: Diagnosis not present

## 2023-05-02 LAB — CBC
HCT: 29.4 % — ABNORMAL LOW (ref 36.0–46.0)
HCT: 30.1 % — ABNORMAL LOW (ref 36.0–46.0)
Hemoglobin: 9.2 g/dL — ABNORMAL LOW (ref 12.0–15.0)
Hemoglobin: 9.6 g/dL — ABNORMAL LOW (ref 12.0–15.0)
MCH: 29.7 pg (ref 26.0–34.0)
MCH: 29.8 pg (ref 26.0–34.0)
MCHC: 31.3 g/dL (ref 30.0–36.0)
MCHC: 31.9 g/dL (ref 30.0–36.0)
MCV: 94.8 fL (ref 80.0–100.0)
MCV: 93.5 fL (ref 80.0–100.0)
Platelets: 217 10*3/uL (ref 150–400)
Platelets: 229 10*3/uL (ref 150–400)
RBC: 3.1 MIL/uL — ABNORMAL LOW (ref 3.87–5.11)
RBC: 3.22 MIL/uL — ABNORMAL LOW (ref 3.87–5.11)
RDW: 17.7 % — ABNORMAL HIGH (ref 11.5–15.5)
RDW: 17.7 % — ABNORMAL HIGH (ref 11.5–15.5)
WBC: 9.1 10*3/uL (ref 4.0–10.5)
WBC: 10.8 10*3/uL — ABNORMAL HIGH (ref 4.0–10.5)
nRBC: 0 % (ref 0.0–0.2)
nRBC: 0 % (ref 0.0–0.2)

## 2023-05-02 LAB — MAGNESIUM: Magnesium: 2.1 mg/dL (ref 1.7–2.4)

## 2023-05-02 LAB — RESPIRATORY PANEL BY PCR

## 2023-05-02 LAB — ANCA PROFILE
Anti-MPO Antibodies: <0.2 U (ref 0.0–0.9)
Anti-PR3 Antibodies: <0.2 U (ref 0.0–0.9)
Atypical P-ANCA titer: <1:20 {titer}
C-ANCA: <1:20 {titer}
P-ANCA: <1:20 {titer}

## 2023-05-02 LAB — BPAM RBC
Blood Product Expiration Date: 202501062359
Blood Product Expiration Date: 202501062359
ISSUE DATE / TIME: 202412180611
ISSUE DATE / TIME: 202412180920
Unit Type and Rh: 6200
Unit Type and Rh: 6200

## 2023-05-02 LAB — BASIC METABOLIC PANEL
Anion gap: 7 (ref 5–15)
BUN: 66 mg/dL — ABNORMAL HIGH (ref 8–23)
CO2: 28 mmol/L (ref 22–32)
Calcium: 8.1 mg/dL — ABNORMAL LOW (ref 8.9–10.3)
Chloride: 105 mmol/L (ref 98–111)
Creatinine, Ser: 1.71 mg/dL — ABNORMAL HIGH (ref 0.44–1.00)
GFR, Estimated: 30 mL/min — ABNORMAL LOW (ref >60)
Glucose, Bld: 164 mg/dL — ABNORMAL HIGH (ref 70–99)
Potassium: 3.8 mmol/L (ref 3.5–5.1)
Sodium: 140 mmol/L (ref 135–145)

## 2023-05-02 LAB — GLUCOSE, CAPILLARY
Glucose-Capillary: 159 mg/dL — ABNORMAL HIGH (ref 70–99)
Glucose-Capillary: 156 mg/dL — ABNORMAL HIGH (ref 70–99)
Glucose-Capillary: 132 mg/dL — ABNORMAL HIGH (ref 70–99)
Glucose-Capillary: 149 mg/dL — ABNORMAL HIGH (ref 70–99)

## 2023-05-02 LAB — TYPE AND SCREEN
ABO/RH(D): A POS
Antibody Screen: NEGATIVE
Unit division: 0
Unit division: 0

## 2023-05-02 LAB — ALDOLASE: Aldolase: 7.9 U/L (ref 3.3–10.3)

## 2023-05-02 LAB — C4 COMPLEMENT: Complement C4, Body Fluid: 33 mg/dL (ref 12–38)

## 2023-05-02 LAB — BLOOD GAS, VENOUS
Acid-Base Excess: 2.5 mmol/L — ABNORMAL HIGH (ref 0.0–2.0)
Bicarbonate: 29.3 mmol/L — ABNORMAL HIGH (ref 20.0–28.0)
Drawn by: 8306
O2 Saturation: 89.3 %
Patient temperature: 36.5
pCO2, Ven: 52 mm[Hg] (ref 44–60)
pH, Ven: 7.36 (ref 7.25–7.43)
pO2, Ven: 53 mm[Hg] — ABNORMAL HIGH (ref 32–45)

## 2023-05-02 LAB — STREP PNEUMONIAE URINARY ANTIGEN: Strep Pneumo Urinary Antigen: NEGATIVE

## 2023-05-02 LAB — RHEUMATOID FACTOR: Rheumatoid fact SerPl-aCnc: 12.5 [IU]/mL (ref <14.0)

## 2023-05-02 LAB — MRSA NEXT GEN BY PCR, NASAL: MRSA by PCR Next Gen: NOT DETECTED

## 2023-05-02 LAB — C3 COMPLEMENT: C3 Complement: 116 mg/dL (ref 82–167)

## 2023-05-02 LAB — ANTI-SCLERODERMA ANTIBODY: Scleroderma (Scl-70) (ENA) Antibody, IgG: <0.2 AI (ref 0.0–0.9)

## 2023-05-02 LAB — CYCLIC CITRUL PEPTIDE ANTIBODY, IGG/IGA: CCP Antibodies IgG/IgA: 4 U (ref 0–19)

## 2023-05-02 SURGERY — ESOPHAGOGASTRODUODENOSCOPY (EGD) WITH PROPOFOL
Anesthesia: Monitor Anesthesia Care

## 2023-05-02 MED ORDER — TORSEMIDE 20 MG PO TABS: 60.0000 mg | ORAL_TABLET | Freq: Two times a day (BID) | ORAL | Status: DC

## 2023-05-02 MED ORDER — ORAL CARE MOUTH RINSE: 15.0000 mL | OROMUCOSAL | Status: DC | PRN

## 2023-05-02 MED ORDER — POTASSIUM CHLORIDE CRYS ER 20 MEQ PO TBCR
20.0000 meq | EXTENDED_RELEASE_TABLET | Freq: Once | ORAL | Status: AC
  Administered 2023-05-02: 20 meq via ORAL
  Filled 2023-05-02: qty 1

## 2023-05-02 MED ORDER — FUROSEMIDE 10 MG/ML IJ SOLN
60.0000 mg | Freq: Once | INTRAMUSCULAR | Status: AC
  Administered 2023-05-02: 60 mg via INTRAVENOUS
  Filled 2023-05-02: qty 8

## 2023-05-02 NOTE — Assessment & Plan Note (Addendum)
Given patient was treated for CAP during prior admission and CT showed similar findings during this admission, pulmonology consulted who help broad differential at this time.  Will continue treating with empiric antibiotics and trend inflammatory markers. There is some concern for underlying pathology given improvement not seen on CT with treatment last admission. Pulmonology did recommend considering steroids, will hold off for now in setting of possible GI bleed.  -Will obtain barium swallow to rule out possibility of aspiration pneumonitis -Pulmonology following, appreciate recs -Follow-up MRSA swab SLP eval with modified barium swallow to rule out aspiration -Follow-up screening autoimmune labs and hypersensitivity pneumonitis panel ordered by pulmonology -Will continue antibiotic treatment with ceftriaxone 1 g (12/17 - ) and azithromycin (12/18 - ) pending further recs from pulmonology, may consider broadening coverage

## 2023-05-02 NOTE — Assessment & Plan Note (Deleted)
Will hold Eliquis 5 mg BID given suspected UGIB, restart per GI recs. -Follow up TTE to evaluate status of LV apical thrombus previously noted on TTE 11/28 -Consult Cardiology in AM -AM BMP; goal K>4, mag>2

## 2023-05-02 NOTE — TOC Initial Note (Signed)
Transition of Care [facility]) - Initial/Assessment Note    Patient Details  Name: Tonya Glass MRN: [id number] Date of Birth: [birth year]  [facility] CM/SW Contact:    Baldemar Lenis, LCSW Phone Number: 05/02/2023, 2:27 PM  Clinical Narrative:         Patient from [facility]. CSW discussed with Baxter Hire about patient's prior function, it appears patient is close to baseline. Patient can return to SNF when medically stable. CSW to follow.          Expected Discharge Plan: Skilled Nursing Facility Barriers to Discharge: Continued Medical Work up   Patient Goals and CMS Choice Patient states their goals for this hospitalization and ongoing recovery are:: return to [employer].gov Compare Post Acute Care list provided to:: Patient Choice offered to / list presented to : Patient [place] ownership interest in [facility].provided to:: Patient    Expected Discharge Plan and Services     Post Acute Care Choice: Skilled Nursing Facility Living arrangements for the past 2 months: Skilled Nursing Facility                                      Prior Living Arrangements/Services Living arrangements for the past 2 months: Skilled Nursing Facility Lives with:: Facility Resident Patient language and need for interpreter reviewed:: Yes        Need for Family Participation in Patient Care: No (Comment) Care giver support system in place?: Yes (comment)   Criminal Activity/Legal Involvement Pertinent to Current Situation/Hospitalization: No - Comment as needed  Activities of Daily Living      Permission Sought/Granted                  Emotional Assessment   Attitude/Demeanor/Rapport: Engaged Affect (typically observed): Appropriate Orientation: : Oriented to Self, Oriented to Place, Oriented to  Time, Oriented to Situation Alcohol / Substance Use: Not Applicable Psych Involvement: No (comment)  Admission  diagnosis:  Hematoma of right flank, initial encounter [S30.1XXA] Gastrointestinal hemorrhage with melena [K92.1] Symptomatic anemia [D64.9] Multifocal pneumonia [J18.9] Anemia, unspecified type [D64.9] Closed compression fracture of L2 vertebra, initial encounter (HCC) [S32.020A] Patient Active Problem List   Diagnosis Date Noted   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   Closed compression fracture of second lumbar vertebra (HCC) 05/02/2023   Gastrointestinal hemorrhage with melena 05/02/2023   Sacral wound 05/02/2023   Symptomatic anemia 05/01/2023   HFrEF (heart failure with reduced ejection fraction) (HCC) 05/01/2023   Prolonged QT interval 05/01/2023   Lumbar compression fracture (HCC) 05/01/2023   Heme positive stool 05/01/2023   SOB (shortness of breath) 04/18/2023   Pulmonary hypertension, unspecified (HCC) 04/15/2023   Concern for multidrug resistant pneumonia 04/15/2023   Acute systolic heart failure (HCC) 04/14/2023   Acute kidney injury 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) 04/11/2023   Anemia 04/11/2023   COPD (chronic obstructive pulmonary disease) (HCC) 04/11/2023   GERD (gastroesophageal reflux disease) 04/11/2023   Acute exacerbation of CHF (congestive heart failure) (HCC) 04/10/2023   Pressure injury of skin 06/23/2022   Cor pulmonale, chronic (HCC) 09/12/2021   PAH (pulmonary artery hypertension) (HCC) 09/12/2021   Peripheral venous insufficiency 09/12/2021   Stage 3b chronic kidney disease (HCC) 09/12/2021   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   Paravalvular leak (prosthetic valve) 08/07/2021   Nonrheumatic mitral valve regurgitation    Venous stasis ulcers  of both lower extremities (HCC) 04/13/2021   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   Paroxysmal atrial flutter (HCC) 04/13/2021   Thoracic aortic aneurysm without rupture (HCC) 03/19/2018    Nonrheumatic aortic (valve) insufficiency 03/19/2018   Acute CHF (congestive heart failure) (HCC) 08/18/2017   Acute diastolic CHF (congestive heart failure) (HCC) 08/18/2017   CHF (congestive heart failure) (HCC) 08/18/2017   Hypothyroidism 07/23/2017   Acute on chronic hypoxic respiratory failure (HCC) 07/23/2017   Aortic valve stenosis 10/10/2016   Diastolic murmur 08/02/2016   SSS (sick sinus syndrome) (HCC) 03/20/2016   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   Atherosclerosis of native coronary artery of native heart without angina pectoris 09/06/2014   Pacemaker 05/28/2013   Essential hypertension 05/28/2013   Dyslipidemia 05/28/2013   S/P aortic valve replacement with bioprosthetic valve 05/28/2013   T2DM (type 2 diabetes mellitus) (HCC) 05/28/2013   Chronic heart failure (HCC) 05/28/2013   PCP:  No primary care provider on file. Pharmacy:   [facility] [place], [place] - [address] [place] [address] Phone: [phone] Fax: [phone]  [facility] - [place], [place] - [phone] [place] [address] Phone: [phone] Fax: [phone]  [facility] - [place], [place] - [place] [address] [place] [address] Phone: [phone] Fax: [phone]     Social Drivers of Health (SDOH) Social History: SDOH Screenings   Food Insecurity: No Food Insecurity (05/01/2023)  Housing: Low Risk  (05/01/2023)  Transportation Needs: No Transportation Needs (05/01/2023)  Utilities: Not At Risk (05/01/2023)  Social Connections: Unknown (09/25/2021)   Received from [facility]  Tobacco Use: Medium Risk (04/30/2023)   SDOH Interventions:     Readmission Risk Interventions     No data to display

## 2023-05-02 NOTE — Progress Notes (Signed)
NAME:  Tonya Glass, MRN:  [id number], DOB:  02/28/1946, LOS: 1 ADMISSION DATE:  04/30/2023, CONSULTATION DATE:  05/01/23 REFERRING MD:  Linwood Dibbles CHIEF COMPLAINT:  Dyspnea   History of Present Illness:  Tonya Glass is a 77 y.o. female who has a PMH including but not limited to sCHF (Echo from Nov 2024 with EF 35-40%), AS s/p AVR, sick sinus syndrome, HTN, Hypothyroidism, CKD IV, DM2, COPD on 2-4L O2 (PFt's with normal function but some air trapping and hyperinflation). She presented to [facility] ED 12/18 with progressive weakness. She had recent admission 11/27 through 12/11 for what was presumed to be CHF exacerbation s/p diuresis followed by normal right heart cath. Her diuretic regimen was decreased (had been on Lasix gtt) and she was discharged on Torsemide 60mg  BID with instructions to follow up as an outpatient.  In ED, she was hypoxic at her baseline with 2-4L O2 requirement. CT C/A/P demonstrated multifocal PNA with GGO's bilaterally and small bilateral effusions and RLL consolidation. She was started on CTX/Azithro coverage. She endorsed intermittent productive cough and intermittent pleuritic left sided chest pain. Denies any recent fevers/chills/sweats, chest pain, N/V/D, abd pain, myalgias.  On prior admission, CXR showed bilateral interstitial opacities. Given CT this admission with ongoing bilateral opacities, PCCM was called to see in consultation.   Pertinent  Medical History:  has Pacemaker; Essential hypertension; Dyslipidemia; S/P aortic valve replacement with bioprosthetic valve; T2DM (type 2 diabetes mellitus) (HCC); Chronic heart failure (HCC); H/O aortic root repair; Atherosclerosis of native coronary artery of native heart without angina pectoris; SSS (sick sinus syndrome) (HCC); Diastolic murmur; Aortic valve stenosis; Hypothyroidism; Acute on chronic hypoxic respiratory failure (HCC); Acute CHF (congestive heart failure) (HCC); Acute diastolic CHF (congestive heart failure)  (HCC); CHF (congestive heart failure) (HCC); Thoracic aortic aneurysm without rupture (HCC); Nonrheumatic aortic (valve) insufficiency; Venous stasis ulcers of both lower extremities (HCC); Second degree atrioventricular block; Aortic prosthetic valve regurgitation; Paroxysmal atrial flutter (HCC); Nonrheumatic mitral valve regurgitation; Paravalvular leak (prosthetic valve); Cor pulmonale, chronic (HCC); PAH (pulmonary artery hypertension) (HCC); Peripheral venous insufficiency; Stage 3b chronic kidney disease (HCC); Mixed simple and mucopurulent chronic bronchitis (HCC); Pressure injury of skin; Acute exacerbation of CHF (congestive heart failure) (HCC); Acute kidney injury 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); Anemia; COPD (chronic obstructive pulmonary disease) (HCC); GERD (gastroesophageal reflux disease); Acute systolic heart failure (HCC); Pulmonary hypertension, unspecified (HCC); Multifocal pneumonia; SOB (shortness of breath); Symptomatic anemia; HFrEF (heart failure with reduced ejection fraction) (HCC); Prolonged QT interval; Lumbar compression fracture (HCC); Heme positive stool; Left ventricular thrombus; Closed compression fracture of second lumbar vertebra (HCC); and Gastrointestinal hemorrhage with melena on their problem list.  Significant Hospital Events: Including procedures, antibiotic start and stop dates in addition to other pertinent events   12/18 admit  Interim History / Subjective:  No distress On 4L [place]   Objective:  Blood pressure (!) 141/40, pulse 66, temperature 98.1 F (36.7 C), temperature source Oral, resp. rate 18, height 5' (1.524 m), weight 100.3 kg, SpO2 100%.        Intake/Output Summary (Last 24 hours) at 05/02/2023 0949 Last data filed at 05/02/2023 0606 Gross per 24 hour  Intake 358.49 ml  Output 200 ml  Net 158.49 ml   Filed Weights   04/30/23 1946 05/02/23 0529  Weight: 96 kg 100.3 kg    Examination: General: Elderly appearing  female in NAD HEENT: MM pink/moist; [place] in place Neuro: Aox3; MAE CV: s1s2, RRR, no m/r/g PULM:  dim clear  BS bilaterally; 4l [place] GI: soft, bsx4 active  Extremities: warm/dry, no edema  Skin: no rashes or lesions   Labs/imaging personally reviewed:  CT C/A/P 12/18 > multifocal PNA with GGO's bilaterally and small bilateral effusions and RLL consolidation  Assessment & Plan:   Acute on chronic hypoxic respiratory failure - CT shows persistent bilateral opacities that were evident on CXR last admission. Unclear etiology but broad differential. Favor multifocal PNA in pt from SNF with weak cough etc. Can not rule out autoimmune process vs HSP leading to ILD type picture. She did have recent admission with CAP coverage; therefore, this could be slow to resolve PNA needing longer treatment. Atelectasis - likely from hypoinflation in setting dyspnea + back pain from lumbar compression fx. Hx mild COPD though 2-4L O2 dependent at baseline (followed by Dr. Celine Mans) Plan: -cont [place] for sats >92% -cont rocephin/azithro -check mrsa pcr, rvp, urine leginella/strep -wbc/fever wnl; cont to trend; if trending up may need to consider switching abx to HAP coverage -follow autoimmune panel and HSP panel -consider steroids -diuresis per primary -pulm toiletry: IS/flutter -OOB, PT, OT -cont dulera; prn duoneb for wheezing  Best practice (evaluated daily):  Per primary team.  Labs   CBC: Recent Labs  Lab 04/30/23 2006 04/30/23 2011 05/01/23 0415 05/01/23 1434 05/02/23 0714  WBC 9.5  --   --   --  9.1  NEUTROABS 6.8  --   --   --   --   HGB 7.4* 7.5* 6.9* 9.9* 9.2*  HCT 25.3* 22.0* 22.9* 31.6* 29.4*  MCV 100.0  --   --   --  94.8  PLT 261  --   --   --  217    Basic Metabolic Panel: Recent Labs  Lab 04/30/23 2006 04/30/23 2011 05/01/23 0415 05/02/23 0714  NA 140 138 138 140  K 3.6 3.6 4.5 3.8  CL 102 101 103 105  CO2 27  --  25 28  GLUCOSE 91 86 178* 164*  BUN 71* 71* 72* 66*   CREATININE 2.05* 2.20* 2.04* 1.71*  CALCIUM 9.0  --  8.2* 8.1*  MG  --   --  2.0 2.1   GFR: Estimated Creatinine Clearance: 29.3 mL/min (A) (by C-G formula based on SCr of 1.71 mg/dL (H)). Recent Labs  Lab 04/30/23 2006 05/01/23 1433 05/02/23 0714  PROCALCITON  --  0.15  --   WBC 9.5  --  9.1    Liver Function Tests: Recent Labs  Lab 04/30/23 2006  AST 19  ALT 16  ALKPHOS 72  BILITOT 1.2*  PROT 6.0*  ALBUMIN 2.8*   No results for input(s): "LIPASE", "AMYLASE" in the last 168 hours. No results for input(s): "AMMONIA" in the last 168 hours.  ABG    Component Value Date/Time   PHART 7.324 (L) 08/07/2021 1306   PCO2ART 47.8 08/07/2021 1306   PO2ART 107 08/07/2021 1306   HCO3 35.8 (H) 04/17/2023 1220   HCO3 34.8 (H) 04/17/2023 1220   TCO2 27 04/30/2023 2011   ACIDBASEDEF 2.0 08/07/2021 1306   O2SAT 61 04/17/2023 1220   O2SAT 60 04/17/2023 1220     Coagulation Profile: Recent Labs  Lab 04/30/23 2006  INR 1.9*    Cardiac Enzymes: Recent Labs  Lab 05/01/23 1433  CKTOTAL 18*    HbA1C: Hgb A1c MFr Bld  Date/Time Value Ref Range Status  04/11/2023 06:29 AM 6.0 (H) 4.8 - 5.6 % Final    Comment:    (NOTE) Pre diabetes:  5.7%-6.4%  Diabetes:              >6.4%  Glycemic control for   <7.0% adults with diabetes   10/01/2022 10:43 AM 6.5 (H) 4.8 - 5.6 % Final    Comment:             Prediabetes: 5.7 - 6.4          Diabetes: >6.4          Glycemic control for adults with diabetes: <7.0     CBG: Recent Labs  Lab 05/01/23 1250 05/01/23 1607 05/01/23 1815 05/01/23 2223 05/02/23 0618  GLUCAP 150* 153* 157* 174* 159*    Review of Systems:   All negative; except for those that are bolded, which indicate positives.  Constitutional: weight loss, weight gain, night sweats, fevers, chills, fatigue, weakness.  HEENT: headaches, sore throat, sneezing, nasal congestion, post nasal drip, difficulty swallowing, tooth/dental problems, visual  complaints, visual changes, ear aches. Neuro: difficulty with speech, weakness, numbness, ataxia. CV:  chest pain, orthopnea, PND, swelling in lower extremities, dizziness, palpitations, syncope.  Resp: cough, hemoptysis, dyspnea, wheezing. GI: heartburn, indigestion, abdominal pain, nausea, vomiting, diarrhea, constipation, change in bowel habits, loss of appetite, hematemesis, melena, hematochezia.  GU: dysuria, change in color of urine, urgency or frequency, flank pain, hematuria. MSK: joint pain or swelling, decreased range of motion, back pain. Psych: change in mood or affect, depression, anxiety, suicidal ideations, homicidal ideations. Skin: rash, itching, bruising.   Past Medical History:  She,  has a past medical history of Anemia, Anxiety, Aortic valve stenosis (10/10/2016), Arthritis, Chronic HFrEF (heart failure with reduced ejection fraction) (HCC), CKD (chronic kidney disease), Constipation (04/15/2023), COPD (chronic obstructive pulmonary disease) (HCC), Depression, Gallstones, Heart murmur, High cholesterol, History of kidney stones, Hypertension, Hypothyroidism, LV thrombus (04/12/2023), Migraines, Orthopnoea, Pacemaker, Pneumonia (07/23/2017), Stroke (HCC) (2001), Symptomatic hypotension (10/10/2016), Syncope and collapse, and Type II diabetes mellitus (HCC).   Surgical History:   Past Surgical History:  Procedure Laterality Date   AORTIC ARCH ANGIOGRAPHY N/A 08/07/2021   Procedure: AORTIC ARCH ANGIOGRAPHY;  Surgeon: Tonny Bollman, MD;  Location: [facility] INVASIVE CV LAB;  Service: Cardiovascular;  Laterality: N/A;   BREAST SURGERY Right    "clamped blood vessel and stitched it up; in [place]"   CARDIAC CATHETERIZATION     CARDIAC VALVE REPLACEMENT  01/26/2013   "cow valve put in; widened the second valve" (02/25/2013) - [place], [place]   CATARACT EXTRACTION W/ INTRAOCULAR LENS  IMPLANT, BILATERAL Bilateral 04/2017   CHOLECYSTECTOMY OPEN  1971   CYST EXCISION     "taken off my  chest"   INSERT / REPLACE / REMOVE PACEMAKER  01/19/2013   [employer] - dual chamber   LAPAROSCOPIC GASTRIC SLEEVE RESECTION  2009   in [place]   PPM GENERATOR CHANGEOUT N/A 06/22/2022   Procedure: PPM GENERATOR CHANGEOUT;  Surgeon: Duke Salvia, MD;  Location: [facility] INVASIVE CV LAB;  Service: Cardiovascular;  Laterality: N/A;   RIGHT HEART CATH N/A 04/17/2023   Procedure: RIGHT HEART CATH;  Surgeon: Kathleene Hazel, MD;  Location: [facility] INVASIVE CV LAB;  Service: Cardiovascular;  Laterality: N/A;   RIGHT/LEFT HEART CATH AND CORONARY ANGIOGRAPHY N/A 08/07/2021   Procedure: RIGHT/LEFT HEART CATH AND CORONARY ANGIOGRAPHY;  Surgeon: Tonny Bollman, MD;  Location: [facility] INVASIVE CV LAB;  Service: Cardiovascular;  Laterality: N/A;   TEE WITHOUT CARDIOVERSION N/A 06/13/2021   Procedure: TRANSESOPHAGEAL ECHOCARDIOGRAM (TEE);  Surgeon: Thurmon Fair, MD;  Location: [facility] ENDOSCOPY;  Service: Cardiovascular;  Laterality: N/A;   VEIN SURGERY Left    LLE; cut leg open and took out piece of vein that was protruding thru skin; stitched it up; done in [place]     Social History:   reports that she quit smoking about 23 years ago. Her smoking use included cigarettes. She started smoking about 63 years ago. She has a 80 pack-year smoking history. She has never used smokeless tobacco. She reports that she does not drink alcohol and does not use drugs.   Family History:  Her family history includes Asthma in her brother; Breast cancer in her maternal aunt; Colon polyps in her brother and daughter; Diabetes in her maternal aunt and paternal aunt; Heart Problems in her father; Hypertension in her brother; Lung cancer in her mother; Skin cancer in her brother.   Allergies No Known Allergies   Home Medications  Prior to Admission medications   Medication Sig Start Date End Date Taking? Authorizing Provider  acetaminophen (TYLENOL) 325 MG tablet Take 650 mg by mouth every 8 (eight) hours as needed for mild  pain (pain score 1-3).   Yes [provider]  alendronate (FOSAMAX) 70 MG tablet Take 70 mg by mouth every Sunday. 07/13/17  Yes [provider]  Amino Acids-Protein Hydrolys (FEEDING SUPPLEMENT, PRO-STAT SUGAR FREE 64,) LIQD Take 30 mLs by mouth 2 (two) times daily.   Yes [provider]  apixaban (ELIQUIS) 5 MG TABS tablet Take 5 mg by mouth 2 (two) times daily.   Yes [provider]  atorvastatin (LIPITOR) 40 MG tablet Take 40 mg by mouth daily.   Yes [provider]  budesonide-formoterol (SYMBICORT) 160-4.5 MCG/ACT inhaler Inhale 2 puffs into the lungs 2 (two) times daily.   Yes [provider]  carvedilol (COREG) 3.125 MG tablet Take 1 tablet (3.125 mg total) by mouth 2 (two) times daily. 06/23/22  Yes Jonita Albee, PA-C  Cholecalciferol 50 MCG (2000 UT) TABS Take 1 tablet by mouth daily.   Yes [provider]  Emollient (AQUAPHOR ADV THERAPY HEALING) 41 % OINT Apply 1 Application topically See admin instructions. Apply 1 application topically every shift, apply to sacrum wound.   Yes [provider]  Ferrous Sulfate (IRON) 325 (65 Fe) MG TABS Take 325 mg by mouth daily. 08/13/17  Yes [provider]  FLUoxetine (PROZAC) 40 MG capsule Take 40 mg by mouth daily.    Yes [provider]  gabapentin (NEURONTIN) 600 MG tablet Take 600 mg by mouth at bedtime. 04/24/23  Yes [provider]  guaiFENesin (MUCINEX) 600 MG 12 hr tablet Take 600 mg by mouth 2 (two) times daily.   Yes [provider]  HUMALOG KWIKPEN 100 UNIT/ML KwikPen Inject into the skin 2 (two) times daily. Sliding scale 03/11/23  Yes [provider]  hydrALAZINE (APRESOLINE) 25 MG tablet Take 1 tablet (25 mg total) by mouth every 8 (eight) hours. 04/23/23  Yes Elberta Fortis, MD  ipratropium (ATROVENT) 0.06 % nasal spray Place 2 sprays into both nostrils 3 (three) times daily. 12/14/21  Yes Charlott Holler, MD   isosorbide mononitrate (IMDUR) 30 MG 24 hr tablet Take 0.5 tablets (15 mg total) by mouth daily. 04/23/23  Yes Elberta Fortis, MD  LANTUS SOLOSTAR 100 UNIT/ML Solostar Pen Inject 6 Units into the skin at bedtime. 04/23/23  Yes Elberta Fortis, MD  levocetirizine (XYZAL) 5 MG tablet Take 5 mg by mouth at bedtime. 09/05/21  Yes [provider]  levothyroxine (SYNTHROID) 75 MCG  tablet Take 75 mcg by mouth daily. 03/22/23  Yes [provider]  magnesium oxide (MAG-OX) 400 (240 Mg) MG tablet Take 400 mg by mouth daily.   Yes [provider]  [facility] [place] powder Apply 1 Application topically 2 (two) times daily. 04/24/23  Yes [provider]  pantoprazole (PROTONIX) 40 MG tablet Take 40 mg by mouth 2 (two) times daily. 03/26/23  Yes [provider]  PROAIR RESPICLICK 108 (90 Base) MCG/ACT AEPB Inhale 2 puffs into the lungs 4 (four) times daily as needed. 02/19/23  Yes [provider]  sodium chloride (OCEAN) 0.65 % SOLN nasal spray Place 2 sprays into both nostrils every 6 (six) hours as needed for congestion.   Yes [provider]  sucralfate (CARAFATE) 1 g tablet Take 1 g by mouth 4 (four) times daily -  with meals and at bedtime.   Yes [provider]  torsemide (DEMADEX) 20 MG tablet Take 60 mg by mouth 2 (two) times daily. 04/30/23  Yes [provider]  traZODone (DESYREL) 50 MG tablet Take 0.5 tablets (25 mg total) by mouth at bedtime. 04/23/23  Yes Elberta Fortis, MD  Benzethonium Chloride 0.13 % LIQD Apply 1 Application topically 2 (two) times a week. On Tuesdays and Fridays    [provider]  nystatin ointment (MYCOSTATIN) Apply 1 application  topically daily as needed (rash). 100,000- units/gram    [provider]      JD Anselm Lis [facility] 05/02/2023, 10:10 AM  Please see [email or web address] for pager details.  From 7A-7P if no response, please call [phone]. After  hours, please call ELink [phone].

## 2023-05-02 NOTE — Assessment & Plan Note (Addendum)
S/p 2u pRBCs with hg 6.9 to 9.9.  9.2 this morning. GI following who recommedned EGD once patients respiratory status is stable.  Nursing did endorse patient had 2 episodes of melanotic stool last night and today - PM CBC given benign stool - daily CBC  -Continue to hold Eliquis -Continue Protonix 40 mg twice daily (12/18- )  - transfusion threshold hbg<8 given A fib

## 2023-05-02 NOTE — Assessment & Plan Note (Deleted)
Suspect acute fracture d/t osteoporosis in setting of physical therapy.  Currently wearing TLSO brace and pain is minimal.Will reach out to neurosurgery considering patient denies any trauma. - neurosurgery consulted, appreciate recs - Will add calcitonin spray daily  - continue tylenol 650 m q5h  -Nursing order for activity as tolerated with brace -PT/OT eval and treat

## 2023-05-02 NOTE — Assessment & Plan Note (Signed)
Nursing noted sacral skin breakdown.  Wound care nurse already consulted.  Will follow-up for recommendations regarding skin breakdown - WOC consult

## 2023-05-02 NOTE — Plan of Care (Signed)
FMTS Brief Progress Note  Went bedside to discuss goals of care/current plan with family outpatient.  In room daughter and granddaughter were present.  Discussed patient's current medical issues, that she has a GI bleed and needs procedure to find it/stop it, that she has a bad heart that is hard to treat/affects her kidneys poorly, and lung findings that would require invasive procedure to determine the cause.  The daughter was appreciative of my explanations, and encouraged her mother to get the EGD procedure for her GI bleed.  At that time patient agreed to GI procedure.  She notes that she was uncomfortable with how invasive procedure was, and thought she might be awake for it.  Daughter felt that the invasive procedure to figure out the lung situation, may be more than her mother could take, and I told her that I tend to agree.  We discussed reaching out to palliative care, to further be connected with resources to help manage these conditions.  Daughter and granddaughter were in agreement.  Will consult palliative care   Bess Kinds, MD 05/02/2023, 5:01 PM PGY-3, [facility] Night Resident  Please page [phone] with questions.

## 2023-05-02 NOTE — Progress Notes (Signed)
Daily Progress Note Intern Pager: [phone]  Patient name: Tonya Glass Medical record number: [id number] Date of birth: 04/13/1946 Age: 77 y.o. Gender: female  Primary Care Provider: No primary care provider on file. Consultants: GI, pulmonology, cardiology Code Status: DNR  Pt Overview and Major Events to Date:  12/18: admitted, s/p 2U pRBCs  Assessment and Plan: Patient is a 77 year old female with extensive past medical history most notable for HFrEF, sick sinus syndrome, CKD stage IV, COPD on home 2 L who presented for weakness and found to be anemic 2/2 to GI bleed. Assessment & Plan Symptomatic anemia S/p 2u pRBCs with hg 6.9 to 9.9.  9.2 this morning. GI following who recommedned EGD once patients respiratory status is stable.  Nursing did endorse patient had 2 episodes of melanotic stool last night and today - PM CBC given benign stool - daily CBC  -Continue to hold Eliquis -Continue Protonix 40 mg twice daily (12/18- )  - transfusion threshold hbg<8 given A fib   Concern for multidrug resistant pneumonia Given patient was treated for CAP during prior admission and CT showed similar findings during this admission, pulmonology consulted who help broad differential at this time.  Will continue treating with empiric antibiotics and trend inflammatory markers. There is some concern for underlying pathology given improvement not seen on CT with treatment last admission. Pulmonology did recommend considering steroids, will hold off for now in setting of possible GI bleed.  -Will obtain barium swallow to rule out possibility of aspiration pneumonitis -Pulmonology following, appreciate recs -Follow-up MRSA swab SLP eval with modified barium swallow to rule out aspiration -Follow-up screening autoimmune labs and hypersensitivity pneumonitis panel ordered by pulmonology -Will continue antibiotic treatment with ceftriaxone 1 g (12/17 - ) and azithromycin (12/18 - ) pending  further recs from pulmonology, may consider broadening coverage   HFrEF (heart failure with reduced ejection fraction) (HCC) Patient is now status post IV diuresis with 60 mg Lasix.  Appears euvolemic on exam and he is on home 2 L of oxygen. -Cardiology following, appreciate recs - reorder home toresmide 60mg  BID  - strict I&Os, daily weights - continue carvedilol 3.125 mg twice daily - cardiology following, appreciate recs  -Use caution with fluids  -Strict I&Os, daily weights -Continue carvedilol 3.125 mg BID  Acute kidney injury 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) Creatinine continues to trend down.  Will continue to monitor while she is admitted - AM BMP COPD (chronic obstructive pulmonary disease) (HCC) On prior admission, patient did have history of hypercapnia.  Patient improved with nightly BiPAP.  Given her history of intellectual delay, she may decline BiPAP at night.  Will ensure that she is doing nightly BiPAP.   - nightly BiPAP -Continue Dulera 2 puffs twice daily Continue Mucinex 600 mg twice daily do not favor acute exacerbation, though does have productive cough. - titrate O2 to 88-92%, on home 2L   Sacral wound Nursing noted sacral skin breakdown.  Wound care nurse already consulted.  Will follow-up for recommendations regarding skin breakdown - WOC consult   Chronic and Stable Problems:  A-fib: Continue holding Eliquis 5 mg twice daily in setting of GI bleed Type 2 diabetes: Continue sensitive sliding scale insulin, may consider adding long-acting tomorrow Hypothyroidism: Continue levothyroxine 75 mcg daily Depression: Continue fluoxetine 40 mg daily, trazodone 25 mg nightly Peripheral neuropathy: Continue gabapentin 600 mg nightly Osteoporosis: Continue cholecalciferol 2000 IU daily Hyperlipidemia: Continue home atorvastatin 40 mg daily HTN: Continue Imdur 50  mg daily, hydralazine 25 mg every 8 hours  FEN/GI: Regular  PPx: SCDs given concern  for GI bleed  Dispo: SNF vs home with palliative care/hospice   Subjective:  Pleasant and appropriate this morning.  Reports no acute distress.  Denies difficulty breathing or chest pain  Objective: Temp:  [97 F (36.1 C)-100 F (37.8 C)] 98 F (36.7 C) (12/19 0444) Pulse Rate:  [59-79] 68 (12/19 0444) Resp:  [17-29] 20 (12/19 0444) BP: (96-163)/(38-132) 133/44 (12/19 0444) SpO2:  [96 %-100 %] 99 % (12/19 0444) Weight:  [100.3 kg] 100.3 kg (12/19 0529) Physical Exam: General: Elderly, obese female, resting in bed comfortably no acute distress Cardiovascular: Paced rhythm, prosthetic valve murmur Respiratory: Clear to auscultation bilaterally, normal work of breathing on 2 L nasal cannula Abdomen: Obese, soft nontender nondistended Extremities: No edema noted bilaterally  Laboratory: Most recent CBC Lab Results  Component Value Date   WBC 9.5 04/30/2023   HGB 9.9 (L) 05/01/2023   HCT 31.6 (L) 05/01/2023   MCV 100.0 04/30/2023   PLT 261 04/30/2023   Most recent BMP    Latest Ref Rng & Units 05/01/2023    4:15 AM  BMP  Glucose 70 - 99 mg/dL 846   BUN 8 - 23 mg/dL 72   Creatinine 9.62 - 1.00 mg/dL 9.52   Sodium 841 - 324 mmol/L 138   Potassium 3.5 - 5.1 mmol/L 4.5   Chloride 98 - 111 mmol/L 103   CO2 22 - 32 mmol/L 25   Calcium 8.9 - 10.3 mg/dL 8.2     Other pertinent labs  None   Imaging/Diagnostic Tests: No new imaging Penne Lash, MD 05/02/2023, 7:13 AM  PGY-1, [place] [facility] FPTS Intern pager: [phone], text pages welcome Secure chat group [facility] Teaching Service

## 2023-05-02 NOTE — Evaluation (Signed)
Occupational Therapy Evaluation Patient Details Name: Tonya Glass MRN: [id number] DOB: 1945/08/30 Today's Date: 05/02/2023   History of Present Illness Pt is a 77 y/o female admitted 04/30/23 from [facility] c/o weakness back pain and buttocks pain x2 days dx with anemia and incidental finding of chronic fx inferior L2 vertebral body. 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 She has not walked since last admission, and if she leaves the room, it is in the [facility]. She gets assist for all aspects of ADL and is set up for grooming/self-feeding. Today she reports pain in her lower back, and soreness in pubic/peri area. She was mod A +2 for bed mobility (educated on log roll for back pain) multiple times for peri cleaning and linen change. Did not attempt OOB this session as no maximove pads on unit Buyer, retail ordered for her room) and Pt declined use of stedy. OT will continue to follow acutely post-acute OT at the <3 hour therapy level is appropriate to maximize safety and independence in ADL and functional transfers.        If plan is discharge home, recommend the following: Two people to help with walking and/or transfers;Two people to help with bathing/dressing/bathroom;Assist for transportation;A lot of help with walking and/or transfers;Assistance with cooking/housework;Supervision due to cognitive status;Help with stairs or ramp for entrance    Functional  Status Assessment  Patient has had a recent decline in their functional status and demonstrates the ability to make significant improvements in function in a reasonable and predictable amount of time.  Equipment Recommendations  Other (comment) (TBD)    Recommendations for Other Services PT consult;Speech consult     Precautions / Restrictions Precautions Precautions: Fall Required Braces or Orthoses: Spinal Brace Spinal Brace: Thoracolumbosacral orthotic;Other (comment) Spinal Brace Comments: Per note by Bess Kinds, MD on 05/01/23 he spoke with Dr Lisbeth Renshaw who determined no brace needed. Restrictions Weight Bearing Restrictions Per Provider Order: No      Mobility Bed Mobility Overal bed mobility: Needs Assistance Bed Mobility: Rolling Rolling: Mod assist, Used rails, +2 for physical assistance         General bed mobility comments: follows single step directions well with cues. heavy assist from therapy team and use of bed pad to facilitate    Transfers                   General transfer comment: not attempted this session. Pt reporting to therapists that she does not like the stedy because it hurts her knees (even with pillows) and no lift pads on the unit for maximove Buyer, retail aware and ordering for Pt) significant time spent rolling at bed level for peri care and cleaning of linens post-BM      Balance                                           ADL either performed or assessed with clinical judgement   ADL Overall ADL's :  Needs assistance/impaired Eating/Feeding: Set up;Bed level (HOB elevated)   Grooming: Set up;Wash/dry face;Bed level   Upper Body Bathing: Maximal assistance;Bed level   Lower Body Bathing: Total assistance;Bed level   Upper Body Dressing : Moderate assistance;Bed level   Lower Body Dressing: Total assistance;Bed level Lower Body Dressing Details (indicate cue type and reason): sock mgmt, has  assist with this at baseline Toilet Transfer: Maximal assistance;+2 for physical assistance;+2 for safety/equipment (rolling at bed level) Toilet Transfer Details (indicate cue type and reason): bed level as Pt was already soiled and wet Toileting- Clothing Manipulation and Hygiene: Total assistance;Sit to/from stand;+2 for safety/equipment Toileting - Clothing Manipulation Details (indicate cue type and reason): for cleanup after bowel incontinence at bed level. barrier cream applied     Functional mobility during ADLs:  (bed level this session) General ADL Comments: Pt limited to bed mobility this date due to lack of equipment on  the floor (nursing secretary ordered lift pad) and Pt found wet with bowel movement, multiple rolls back and forth for cleaning and barrier cream application as well as for comfort. Did not attempt to sit EOB this date     Vision Baseline Vision/History: 0 No visual deficits Ability to See in Adequate Light: 0 Adequate Patient Visual Report: No change from baseline Vision Assessment?: No apparent visual deficits     Perception Perception: Within Functional Limits       Praxis Praxis: WFL       Pertinent Vitals/Pain Pain Assessment Pain Assessment: Faces Faces Pain Scale: Hurts even more Pain Location: lower back, purewick placement Pain Descriptors / Indicators: Discomfort     Extremity/Trunk Assessment Upper Extremity Assessment Upper Extremity Assessment: Generalized weakness   Lower Extremity Assessment Lower Extremity Assessment: Defer to PT evaluation   Cervical / Trunk Assessment Cervical / Trunk Assessment: Other exceptions Cervical / Trunk Exceptions: incidental finding of fx inferior L2 vertebral body   Communication Communication Communication: No apparent difficulties   Cognition Arousal: Alert Behavior During Therapy: WFL for tasks assessed/performed Overall Cognitive Status: Impaired/Different from baseline Area of Impairment:  Memory, Problem solving                     Memory: Decreased short-term memory       Problem Solving: Slow processing, Difficulty sequencing, Requires verbal cues, Decreased initiation General Comments: pleasant, following commands but requires multimodal cues and repetition for participation in functional tasks or mobility     General Comments  Pt on 4L O2 (chronically at this amount) and SpO2 WFL    Exercises     Shoulder Instructions      Home Living Family/patient expects to be discharged to:: Skilled nursing facility   Available Help at Discharge: Available 24 hours/day               Bathroom Shower/Tub: Producer, television/film/video: Standard     Home Equipment: Rollator (4 wheels);Shower seat;Grab bars - toilet;Lift chair   Additional Comments: Uses lift chair for sleeping and relaxing.  Elevates it for sit to stand, does not go to dining hall, has been working with therapy      Prior Functioning/Environment Prior Level of Function : Needs assist       Physical Assist : ADLs (physical)     Mobility Comments: recently, they have been using lift equipment (stedy and hoyer) for OOB - she reports that the stedy hurts her knees ADLs Comments: can groom and self-feed. assist with all other ADL  OT Problem List: Impaired balance (sitting and/or standing);Pain;Decreased activity tolerance;Decreased knowledge of use of DME or AE;Decreased strength;Cardiopulmonary status limiting activity;Obesity;Increased edema      OT Treatment/Interventions: Self-care/ADL training;Therapeutic activities;Balance training;DME and/or AE instruction;Patient/family education;Energy conservation;Neuromuscular education;Therapeutic exercise    OT Goals(Current goals can be found in the care plan section) Acute Rehab OT Goals Patient Stated Goal: to be able to stand OT Goal Formulation: With patient Time For Goal Achievement: 05/16/23 Potential to Achieve Goals:  Fair ADL Goals Pt Will Perform Grooming: with set-up;sitting Pt Will Perform Upper Body Bathing: with contact guard assist;with adaptive equipment;sitting Pt Will Perform Lower Body Bathing: with mod assist;with adaptive equipment;sit to/from stand Pt Will Transfer to Toilet: with mod assist;with +2 assist;bedside commode Pt/caregiver will Perform Home Exercise Program: Increased strength;Both right and left upper extremity;With written HEP provided;With Supervision;With theraband Additional ADL Goal #1: Pt will perform bed mobility with mod A to come EOB for participation in ADL tasks  OT Frequency: Min 1X/week    Co-evaluation PT/OT/SLP Co-Evaluation/Treatment: Yes Reason for Co-Treatment: For patient/therapist safety;To address functional/ADL transfers PT goals addressed during session: Mobility/safety with mobility;Strengthening/ROM OT goals addressed during session: ADL's and self-care;Strengthening/ROM      AM-PAC OT "6 Clicks" Daily Activity     Outcome Measure Help from another person eating meals?: A Little Help from another person taking care of personal grooming?: A Little Help from another person toileting, which includes using toliet, bedpan, or urinal?: Total Help from another person bathing (including washing, rinsing, drying)?: Total Help from another person to put on and taking off regular upper body clothing?: A Lot Help from another person to put on and taking off regular lower body clothing?: Total 6 Click Score: 11   End of Session Nurse Communication: Mobility status;Need for lift equipment;Other (comment) (no purewick)  Activity Tolerance: Patient tolerated treatment well;Patient limited by pain Patient left: with call bell/phone within reach;in bed;with bed alarm set (on side for comfort and to off load peri area and sacrum)  OT Visit Diagnosis: Unsteadiness on feet (R26.81);Other abnormalities of gait and mobility (R26.89);Pain;Muscle weakness (generalized)  (M62.81) Pain - Right/Left:  (central lower) Pain - part of body:  (back)                Time: 4782-9562 OT Time Calculation (min): 39 min Charges:  OT General Charges $OT Visit: 1 Visit OT Evaluation $OT Eval Moderate Complexity: 1 Mod  Nyoka Cowden OTR/L Acute Rehabilitation Services Office: [phone]  Evern Bio [facility] 05/02/2023, 11:41 AM

## 2023-05-02 NOTE — Plan of Care (Signed)

## 2023-05-02 NOTE — Assessment & Plan Note (Addendum)
On prior admission, patient did have history of hypercapnia.  Patient improved with nightly BiPAP.  Given her history of intellectual delay, she may decline BiPAP at night.  Will ensure that she is doing nightly BiPAP.   - nightly BiPAP -Continue Dulera 2 puffs twice daily Continue Mucinex 600 mg twice daily do not favor acute exacerbation, though does have productive cough. - titrate O2 to 88-92%, on home 2L

## 2023-05-02 NOTE — Progress Notes (Signed)
Progress Note  Primary GI: Dr. Myrtie Neither DOA: 04/30/2023         Hospital Day: 3   Subjective  Chief Complaint: Anemia in setting of multifocal pneumonia   No family was present at the time of my evaluation. Patient states she had a bowel movement at least once or twice this morning unknown the color of the stool, per report given to [place] she was continuing to have melena.  Patient had 2 units PRBC and responded well to hemoglobin currently above 9 at her baseline. Patient denies GERD, dysphagia, abdominal pain. Discussed with the patient continuing melena per report and anemia that brought her in that I suggested as proceed with an endoscopy.  She states as yesterday she would prefer not to undergo endoscopy.    Objective   Vital signs in last 24 hours: Temp:  [97 F (36.1 C)-98.1 F (36.7 C)] 97.7 F (36.5 C) (12/19 1110) Pulse Rate:  [59-79] 60 (12/19 1110) Resp:  [16-22] 16 (12/19 1110) BP: (112-163)/(40-132) 128/44 (12/19 1110) SpO2:  [96 %-100 %] 100 % (12/19 1110) Weight:  [100.3 kg] 100.3 kg (12/19 0529) Last BM Date : 05/01/23 Last BM recorded by nurses in past 5 days No data recorded  General:   Pleasant, well developed female in no acute distress Heart:  regular rate and rhythm, systolic murmur Pulm: 2 to 3 L oxygen with wheezing  Abdomen:  Soft, Obese AB, Active bowel sounds. No tenderness . Without guarding and Without rebound, No organomegaly appreciated. Extremities:  With 1+ edema bilateral legs with ecchymoses Msk:  Symmetrical without gross deformities. Peripheral pulses intact.   Neurologic:  Alert and  oriented x4;  No focal deficits.  Skin: Ecchymoses along bilateral legs and arms.  No rashes Psychiatric:  Cooperative. Normal mood and affect.  Intake/Output from previous day: 12/18 0701 - 12/19 0700 In: 673.5 [I.V.:8.5; Blood:315; IV Piggyback:350] Out: 200 [Urine:200] Intake/Output this shift: No intake/output data recorded.  Studies/Results: CT  CHEST ABDOMEN PELVIS WO CONTRAST Result Date: 04/30/2023 CLINICAL DATA:  Chest pain.  Back pain.  Hemoglobin drop. EXAM: CT CHEST, ABDOMEN AND PELVIS WITHOUT CONTRAST TECHNIQUE: Multidetector CT imaging of the chest, abdomen and pelvis was performed following the standard protocol without IV contrast. RADIATION DOSE REDUCTION: This exam was performed according to the departmental dose-optimization program which includes automated exposure control, adjustment of the mA and/or kV according to patient size and/or use of iterative reconstruction technique. COMPARISON:  CT 07/06/2021 FINDINGS: CT CHEST FINDINGS Cardiovascular: Left-sided pacemaker in place. Heart is normal in size. Coronary artery calcifications. Prosthetic aortic valve. Advanced aortic atherosclerosis. Dilated descending aorta measuring 3.7 cm post median sternotomy. No pericardial effusion. Decreased density of the blood pool suggestive of anemia. Mediastinum/Nodes: Mildly enlarged and borderline mediastinal lymph nodes, including a 14 mm anterior paratracheal node series 2, image 20. Hilar assessment is limited in the absence of IV contrast. Small to moderate-sized hiatal hernia. Lungs/Pleura: Moderate to advanced multifocal lung opacities with ground-glass and nodular configuration. Airspace disease obscures the area of previous left lower lobe nodule. Small left and small to moderate right pleural effusion, simple fluid density. Consolidation in the dependent right lower lobe may represent compressive atelectasis or infection. There is moderate right middle and lower lobe bronchial thickening. No endobronchial lesion. Musculoskeletal: Moderate thoracic spondylosis with diffuse anterior spurring. Prior median sternotomy. There are no acute or suspicious osseous abnormalities. CT ABDOMEN PELVIS FINDINGS Hepatobiliary: Unremarkable unenhanced appearance of the liver. Clips in the gallbladder fossa postcholecystectomy. No  biliary dilatation. Pancreas:  Parenchymal atrophy. No ductal dilatation or inflammation. Spleen: Normal in size without focal abnormality. Adrenals/Urinary Tract: No adrenal nodule. No hydronephrosis. No renal calculi. No evidence of focal renal abnormality. Decompressed ureters. Nondistended urinary bladder. Stomach/Bowel: Postsurgical change in the greater curvature of the stomach moderate-sized hiatal hernia. The appendix is not definitively seen. No evidence of bowel wall thickening, distention, or inflammatory changes. Small to moderate volume of colonic stool. Vascular/Lymphatic: Advanced aortic and branch atherosclerosis. Retroaortic left renal vein. No evidence of retroperitoneal hematoma. No adenopathy. Reproductive: Uterus and bilateral adnexa are unremarkable. Other: Broad-based rectus diastasis. Small fat containing umbilical hernia. 2.3 x 1.2 cm hyperdensity in the right lateral abdominal wall musculature, series 2, image 83. This is new from prior exam. Scattered gluteal granulomas are again seen. No significant ascites. No free air. Musculoskeletal: Moderate diffuse lumbar degenerative change. Scattered Schmorl's nodes. Chronic compression deformity of L5. Undulation of inferior L2 vertebral body is new from prior. IMPRESSION: 1. Moderate to advanced multifocal lung opacities with ground-glass and nodular configuration, suspicious for multifocal pneumonia. Airspace nodularity obscures the previous left lower lobe pulmonary nodule. 2. Small left and small to moderate right pleural effusions. Consolidation in the dependent right lower lobe may represent compressive atelectasis or infection. 3. Mildly enlarged and borderline mediastinal lymph nodes, likely reactive. 4. Undulation of inferior L2 vertebral body is new from prior exam, suspicious for subacute or acute compression fracture. Recommend correlation with focal tenderness. 5. A 2.3 x 1.2 cm hyperdensity in the right lateral abdominal wall musculature is new from prior exam,  suspicious for small intramuscular abdominal wall hematoma. Aortic Atherosclerosis (ICD10-I70.0). Electronically Signed   By: Narda Rutherford M.D.   On: 04/30/2023 22:45    Lab Results: Recent Labs    04/30/23 2006 04/30/23 2011 05/01/23 0415 05/01/23 1434 05/02/23 0714  WBC 9.5  --   --   --  9.1  HGB 7.4*   < > 6.9* 9.9* 9.2*  HCT 25.3*   < > 22.9* 31.6* 29.4*  PLT 261  --   --   --  217   < > = values in this interval not displayed.   BMET Recent Labs    04/30/23 2006 04/30/23 2011 05/01/23 0415 05/02/23 0714  NA 140 138 138 140  K 3.6 3.6 4.5 3.8  CL 102 101 103 105  CO2 27  --  25 28  GLUCOSE 91 86 178* 164*  BUN 71* 71* 72* 66*  CREATININE 2.05* 2.20* 2.04* 1.71*  CALCIUM 9.0  --  8.2* 8.1*   LFT Recent Labs    04/30/23 2006  PROT 6.0*  ALBUMIN 2.8*  AST 19  ALT 16  ALKPHOS 72  BILITOT 1.2*   PT/INR Recent Labs    04/30/23 2006  LABPROT 21.6*  INR 1.9*     Scheduled Meds:  acetaminophen  650 mg Oral Q6H   atorvastatin  40 mg Oral Daily   carvedilol  3.125 mg Oral BID   cholecalciferol  1,000 Units Oral Daily   FLUoxetine  40 mg Oral Daily   gabapentin  600 mg Oral QHS   guaiFENesin  600 mg Oral BID   hydrALAZINE  25 mg Oral Q8H   insulin aspart  0-9 Units Subcutaneous TID AC   ipratropium  2 spray Each Nare TID   isosorbide mononitrate  15 mg Oral Daily   levothyroxine  75 mcg Oral Daily   mometasone-formoterol  2 puff Inhalation BID   pantoprazole (PROTONIX)  IV  40 mg Intravenous BID   sucralfate  1 g Oral TID WC & HS   traZODone  25 mg Oral QHS   Continuous Infusions:  azithromycin (ZITHROMAX) 500 mg in sodium chloride 0.9 % 250 mL IVPB 500 mg (05/01/23 2354)   cefTRIAXone (ROCEPHIN)  IV 2 g (05/01/23 2229)     Impression/Plan:   Acute on chronic symptomatic anemia on Eliquis Hemoglobin 6.9 on repeat, baseline appears to be between 8 and 9 FOBT positive, s/p 2 units PRBC HGB 9.2  BUN elevation out of portion to  creatinine 01/2016 colonoscopy due to positive FIT diminutive hyperplastic polyp CT chest abdomen pelvis 2.3 x 1.2 cm intramuscular abdominal wall hematoma Patient states she has had a bowel movement uncertain color, no recent stools documented. Per pulmonary patient's at baseline, we are told she had further melena but no stools were documented and patient is currently declining endoscopy.  She also stated she did not want an endoscopy yesterday and reiterates this today.  She is oriented to where she is year and what brought her into the hospital.  I did touch briefly if patient was having large life-threatening bleeding if she would want an endoscopy, she stated she thinks she would be willing to proceed at that time but at this time she declines. Please document stools as none have been documented if patient is continuing to have melena Continue supportive care, Hgb currently at baseline, transfuse is less than 7 Continue Protonix 40 mg twice daily and Carafate Consider goals of care discussion with the patient We will gladly proceed with EGD if and when the patient would like to proceed.  Atrial flutter on Eliquis Last dose 12/19 Continue to hold   CKD Resolved, back at baseline 1.71, BUN 66 ( 71) still elevated   Multifocal pneumonia in setting of COPD with OSA overlap, cor pulmonale and chronic right heart failure CT chest shows advanced multifocal lung opacities with groundglass and nodular configuration suspicious for multifocal pneumonia, small left and small to moderate right pleural effusions No leukocytosis Negative respiratory panel, pending strep and Legionella Patient denies oropharyngeal symptoms or signs of aspiration Consider MBS if any signs of coughing while eating or drinking Patient on Rocephin and azithromycin At baseline for respirations   Morbid obesity   Memory loss Appears to be oriented at this time   Insulin-dependent type 2 diabetes  Principal  Problem:   Symptomatic anemia Active Problems:   T2DM (type 2 diabetes mellitus) (HCC)   Paroxysmal atrial flutter (HCC)   Acute kidney injury 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)   COPD (chronic obstructive pulmonary disease) (HCC)   Multifocal pneumonia   HFrEF (heart failure with reduced ejection fraction) (HCC)   Prolonged QT interval   Lumbar compression fracture (HCC)   Heme positive stool   Left ventricular thrombus   Closed compression fracture of second lumbar vertebra (HCC)   Gastrointestinal hemorrhage with melena    LOS: 1 day   Doree Albee  05/02/2023, 11:47 AM

## 2023-05-02 NOTE — Plan of Care (Signed)
FMTS Brief Progress Note  S: To bedside for night rounds.  Discussed use of BiPAP, patient hesitant because of discomfort with the mask but she is amenable to try it.  Discussed with RN.   O: BP (!) 156/47 (BP Location: Left Wrist)   Pulse 69   Temp 98.3 F (36.8 C) (Axillary)   Resp 18   Ht 5' (1.524 m)   Wt 100.3 kg   SpO2 98%   BMI 43.18 kg/m    General: NAD, pleasant, able to participate in exam Respiratory: No respiratory distress   A/P:  COPD - nightly bipap Remainder of plan per today's progress note  - Orders reviewed. Labs for AM ordered, which was adjusted as needed.  - If condition changes, plan includes page primary team.   Vonna Drafts, MD 05/02/2023, 10:05 PM PGY-2, [facility] Night Resident  Please page [phone] with questions.

## 2023-05-02 NOTE — Evaluation (Signed)
Physical Therapy Evaluation Patient Details Name: Tonya Glass MRN: [id number] DOB: 19-Jun-1945 Today's Date: 05/02/2023  History of Present Illness  Pt is a 77 y/o female admitted 04/30/23 from [facility] c/o weakness back pain and buttocks pain x2 days dx with anemia and incidental finding of chronic fx inferior L2 vertebral body. 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 Pt working with in house therapy on transfers to wheelchair. Pt reports not tolerating Stedy due to increased pain with pressure on knees when attempting to stand. Pt currently limited by increased weakness, and MASD/pressure injury wounds on her bottom. Pt Purewick found to not being working and bed saturated in urine as therapist attempted to move pt in bed. Pt's periarea with increased redness and swelling. RN notified.  Did not attempt OOB due to no appropriate lift pads on unit for staff to be able to get her back to bed.  Nursing Secretary asked to order lift pads and air mattress given pressure injury.      If plan is discharge home, recommend the following: Two people to help with walking and/or transfers;Two people to help with bathing/dressing/bathroom;Assistance with cooking/housework;Assist for transportation   Can travel by private vehicle   No    Equipment Recommendations None recommended by PT     Functional Status Assessment Patient has had a recent decline in  their functional status and/or demonstrates limited ability to make significant improvements in function in a reasonable and predictable amount of time     Precautions / Restrictions Precautions Precautions: Fall Required Braces or Orthoses: Spinal Brace Spinal Brace: Thoracolumbosacral orthotic;Other (comment) Spinal Brace Comments: Per note by Bess Kinds, MD on 05/01/23 he spoke with Dr Lisbeth Renshaw who determined no brace needed. Restrictions Weight Bearing Restrictions Per Provider Order: No      Mobility  Bed Mobility Overal bed mobility: Needs Assistance Bed Mobility: Rolling Rolling: Mod assist, Used rails, +2 for physical assistance         General bed mobility comments: follows single step directions well with cues. heavy assist from therapy team and use of bed pad to facilitate    Transfers                   General transfer comment: not attempted this session. Pt reporting to therapists that she does not like the stedy because it hurts her knees (even with pillows) and no lift pads on the unit for maximove Buyer, retail aware and ordering for Pt) significant time spent rolling at bed level for peri care and cleaning of linens post-BM        Balance Overall balance assessment: Needs assistance                                           Pertinent Vitals/Pain Pain Assessment Pain Assessment: Faces Faces Pain Scale: Hurts even more Pain Location: lower back, purewick placement Pain Descriptors / Indicators: Discomfort Pain Intervention(s): Monitored during session  Home Living Family/patient expects to be discharged to:: Skilled nursing facility   Available Help at Discharge: Available 24 hours/day             Home Equipment: Rollator (4 wheels);Shower seat;Grab bars - toilet;Lift chair Additional Comments: Uses lift chair for sleeping and relaxing.  Elevates it for sit to stand, does not go to dining hall, has  been working with therapy    Prior Function Prior Level of Function : Needs assist       Physical Assist : ADLs (physical)     Mobility Comments: recently, they have been using lift equipment (stedy and hoyer) for OOB - she reports that the stedy hurts her knees ADLs Comments: can groom and self-feed. assist with all other ADL     Extremity/Trunk Assessment   Upper Extremity Assessment Upper Extremity Assessment: Defer to OT evaluation    Lower Extremity Assessment Lower Extremity Assessment: Generalized weakness    Cervical / Trunk Assessment Cervical / Trunk Assessment: Other exceptions Cervical / Trunk Exceptions: incidental finding of fx inferior L2 vertebral body during prior hospitalization  Communication   Communication Communication: No apparent difficulties  Cognition Arousal: Alert Behavior During Therapy: WFL for tasks assessed/performed Overall Cognitive Status: Impaired/Different from baseline Area of Impairment: Memory, Problem solving                     Memory: Decreased short-term memory       Problem Solving: Slow processing, Difficulty sequencing, Requires verbal cues, Decreased initiation General Comments: pleasant, following commands but requires multimodal cues and repetition for participation in functional tasks or mobility        General Comments General comments (skin integrity, edema, etc.): Pt on 4L O2 (chronically at this amount) and SpO2 WFL    Exercises     Assessment/Plan    PT Assessment Patient needs continued PT services  PT Problem List Decreased strength;Decreased balance;Decreased mobility;Obesity;Decreased activity tolerance;Pain       PT Treatment Interventions DME instruction;Therapeutic activities;Gait training;Therapeutic exercise;Stair training;Balance training;Functional mobility training;Patient/family education    PT Goals (Current goals can be found in the Care Plan section)  Acute Rehab PT  Goals Patient Stated Goal: get better PT Goal Formulation: With patient Time For Goal Achievement: 05/16/23 Potential to Achieve Goals: Fair    Frequency Min 1X/week     Co-evaluation   Reason for Co-Treatment: For patient/therapist safety;To address functional/ADL transfers PT goals addressed during session: Mobility/safety with mobility;Strengthening/ROM OT goals addressed during session: ADL's and self-care;Strengthening/ROM       AM-PAC PT "6 Clicks" Mobility  Outcome Measure Help needed turning from your back to your side while in a flat bed without using bedrails?: A Lot Help needed moving from lying on your back to sitting on the side of a flat bed without using bedrails?: Total Help needed moving to and from a bed to a chair (including a wheelchair)?: Total Help needed standing up from a chair using your arms (e.g., wheelchair or bedside chair)?: Total Help needed to walk in hospital room?: Total Help needed climbing 3-5 steps with a railing? : Total 6 Click Score: 7    End of Session Equipment Utilized During Treatment: Oxygen Activity Tolerance: Patient limited by pain Patient left: in bed;with call bell/phone within reach;with bed alarm set Nurse Communication: Mobility status (need for air matress, wounds on sacrum and periarea, as well as blistering of distal LE, pt reports normally LE have dressings in place) PT Visit Diagnosis: Other abnormalities  of gait and mobility (R26.89);Muscle weakness (generalized) (M62.81);Pain;Unsteadiness on feet (R26.81);Difficulty in walking, not elsewhere classified (R26.2) Pain - Right/Left:  (bilateral) Pain - part of body: Knee    Time: 1010-1036 PT Time Calculation (min) (ACUTE ONLY): 26 min   Charges:   PT Evaluation $PT Eval Moderate Complexity: 1 Mod   PT General Charges $$ ACUTE PT VISIT: 1 Visit         Jamaris Biernat B. Beverely Risen PT, DPT Acute Rehabilitation Services Please use secure chat or  Call Office [phone]   Elon Alas Fleet 05/02/2023, 1:48 PM

## 2023-05-02 NOTE — Assessment & Plan Note (Addendum)
Creatinine continues to trend down.  Will continue to monitor while she is admitted - AM BMP

## 2023-05-02 NOTE — Assessment & Plan Note (Addendum)
Patient is now status post IV diuresis with 60 mg Lasix.  Appears euvolemic on exam and he is on home 2 L of oxygen. -Cardiology following, appreciate recs - reorder home toresmide 60mg  BID  - strict I&Os, daily weights - continue carvedilol 3.125 mg twice daily - cardiology following, appreciate recs  -Use caution with fluids  -Strict I&Os, daily weights -Continue carvedilol 3.125 mg BID

## 2023-05-02 NOTE — Assessment & Plan Note (Deleted)
QTcB 510 and receiving pantoprazole 40 mg IV BID.  However, not on any other QT prolonging meds for pantoprazole to interact with currently and benefits outweigh risks in setting of suspected UGIB. -f/u repeat EKG -Avoid QT prolonging meds

## 2023-05-02 NOTE — Progress Notes (Addendum)
Patient Name: Tonya Glass Date of Encounter: 05/02/2023 [facility] Cardiologist: Chrystie Nose, MD   Interval Summary  .    Feeling better.  Breathing improving.   Vital Signs .    Vitals:   05/02/23 0444 05/02/23 0529 05/02/23 0813 05/02/23 1110  BP: (!) 133/44  (!) 141/40 (!) 128/44  Pulse: 68  66 60  Resp: 20  18 16   Temp: 98 F (36.7 C)  98.1 F (36.7 C) 97.7 F (36.5 C)  TempSrc: Axillary  Oral Oral  SpO2: 99%  100% 100%  Weight:  100.3 kg    Height:        Intake/Output Summary (Last 24 hours) at 05/02/2023 1155 Last data filed at 05/02/2023 0606 Gross per 24 hour  Intake 358.49 ml  Output 200 ml  Net 158.49 ml      05/02/2023    5:29 AM 04/30/2023    7:46 PM 04/24/2023    7:23 AM  Last 3 Weights  Weight (lbs) 221 lb 1.9 oz 211 lb 10.3 oz 211 lb 10.3 oz  Weight (kg) 100.3 kg 96 kg 96 kg      Telemetry/ECG    APVP - Personally Reviewed  Physical Exam .    VS:  BP (!) 128/44 (BP Location: Right Wrist)   Pulse 60   Temp 97.7 F (36.5 C) (Oral)   Resp 16   Ht 5' (1.524 m)   Wt 100.3 kg   SpO2 100%   BMI 43.18 kg/m  , BMI Body mass index is 43.18 kg/m. GENERAL:  Chronically ill-appearing.  No acute distress.  HEENT: Pupils equal round and reactive, fundi not visualized, oral mucosa unremarkable NECK:  No jugular venous distention, waveform within normal limits, carotid upstroke brisk and symmetric, no bruits, no thyromegaly LUNGS:  Diminished breath sounds bilaterally.  No crackles or rhonchi HEART:  RRR.  PMI not displaced or sustained,S1 and S2 within normal limits, no S3, no S4, no clicks, no rubs, II/VI systolic murmur at LUSB ABD:  Flat, positive bowel sounds normal in frequency in pitch, no bruits, no rebound, no guarding, no midline pulsatile mass, no hepatomegaly, no splenomegaly EXT:  2 plus pulses throughout, trace bilateral LE edema, no cyanosis no clubbing SKIN:  Pallor.  Stasis dermatitis.  No rashes no  nodules NEURO:  Cranial nerves II through XII grossly intact, motor grossly intact throughout PSYCH:  Cognitively intact, oriented to person place and time   Assessment & Plan .     Ms. Hoeg is a 59F with HFrEF, atrial flutter, aortic root repair, bioprosthetic AVR, PPM, non-obstructive CAD, hypoxic respiratory failure on home O2, suspected OHS, refusal of sleep study, cor pulmonale, LV thrombus admitted with acute on chronic HFrEF and anemia.   # HFrEF:  # RV failure: # Moderate pleural effusions: LVEF 35-40%.  Current volume status relatively stable.  Renal function improved with IV lasix yesterday.  Will give another dose today. Continue carvedilol, hydralazine and Imdur.    # Anemia:  Imaging concerning for small intramuscular abdominal wall hematoma.  Unclear how much this is contributing to her anemia.  FOBT +.  Plan for GI evaluation once more clinically stable. Holding home Eliquis.  H/H stable today after transfusion.    # Elevated troponin: # Chest pain:  Challenging to characterize her chest pain.  HS troponin is mildly elevated and flat.  More consistent with demand ischemia and she isn't a cath candidate at this time given her GI bleed and  CKD IV.      # Hypertension: Continue carvedilol, hydralazine and Imdur.    # Atrial flutter:  Currently in sinus rhythm.  Eliquis on hold.  Continue carvedilol.  # OSA/OHS: Continue to recommend outpatient sleep evaluation.   # LV apical thrombus:  Noted on prior echo.  Eliquis on hold for now.  No indication for TEE if the thrombus was well-seen on TTE.   For questions or updates, please contact [facility] Please consult [email or web address] for contact info under        Signed, Chilton Si, MD

## 2023-05-02 NOTE — Assessment & Plan Note (Deleted)
A1c 6.0, discharged on 6 units LAI daily and SSI. -CBGs Q6h while NPO -No LAI while NPO, dose as needed based on CBGs with goal 140-180 -Sensitive SSI -Still holding Farxiga from last admission as noted above

## 2023-05-02 NOTE — Evaluation (Signed)
Clinical/Bedside Swallow Evaluation Patient Details  Name: Tonya Glass MRN: [id number] Date of Birth: 11-Jun-1945  Today's Date: 05/02/2023 Time: SLP Start Time (ACUTE ONLY): 0915 SLP Stop Time (ACUTE ONLY): 0930 SLP Time Calculation (min) (ACUTE ONLY): 15 min  Past Medical History:  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   Migraines    "none in years til 01/2013-02/2013; none since"  (07/24/2017)   Orthopnoea    "just since open heart surgery" (02/25/2013)   Pacemaker    [employer]    Pneumonia 07/23/2017   Stroke (HCC) 2001   denies residual on 02/25/2013   Symptomatic hypotension 10/10/2016   Syncope and collapse    "prior to open heart surgery" (02/25/2013)   Type II diabetes mellitus (HCC)    Past Surgical History:  Past Surgical History:  Procedure Laterality Date   AORTIC ARCH ANGIOGRAPHY N/A 08/07/2021   Procedure: AORTIC ARCH ANGIOGRAPHY;  Surgeon: Tonny Bollman, MD;  Location: [facility] INVASIVE CV LAB;  Service: Cardiovascular;  Laterality: N/A;   BREAST SURGERY Right    "clamped blood vessel and stitched it up; in [place]"   CARDIAC CATHETERIZATION     CARDIAC VALVE REPLACEMENT  01/26/2013   "cow valve put in; widened the second valve" (02/25/2013) - Tonya, Glass   CATARACT EXTRACTION W/ INTRAOCULAR LENS  IMPLANT, BILATERAL Bilateral 04/2017   CHOLECYSTECTOMY OPEN  1971   CYST EXCISION     "taken off my chest"   INSERT / REPLACE / REMOVE PACEMAKER  01/19/2013   [employer] - dual chamber   LAPAROSCOPIC GASTRIC SLEEVE RESECTION   2009   in [place]   PPM GENERATOR CHANGEOUT N/A 06/22/2022   Procedure: PPM GENERATOR CHANGEOUT;  Surgeon: Duke Salvia, MD;  Location: [facility] INVASIVE CV LAB;  Service: Cardiovascular;  Laterality: N/A;   RIGHT HEART CATH N/A 04/17/2023   Procedure: RIGHT HEART CATH;  Surgeon: Kathleene Hazel, MD;  Location: [facility] INVASIVE CV LAB;  Service: Cardiovascular;  Laterality: N/A;   RIGHT/LEFT HEART CATH AND CORONARY ANGIOGRAPHY N/A 08/07/2021   Procedure: RIGHT/LEFT HEART CATH AND CORONARY ANGIOGRAPHY;  Surgeon: Tonny Bollman, MD;  Location: [facility] INVASIVE CV LAB;  Service: Cardiovascular;  Laterality: N/A;   TEE WITHOUT CARDIOVERSION N/A 06/13/2021   Procedure: TRANSESOPHAGEAL ECHOCARDIOGRAM (TEE);  Surgeon: Thurmon Fair, MD;  Location: [facility] ENDOSCOPY;  Service: Cardiovascular;  Laterality: N/A;   VEIN SURGERY Left    LLE; cut leg open and took out piece of vein that was protruding thru skin; stitched it up; done in [place]   HPI:  Patient is a 77 y.o. female, long term resident at [facility]. PMH: GERD, SOB, HFpEF, HLD, Aortic stenosis s/p valve replacement, Sick sinus syndrome, Hypothyroidism, HTN, CKD, COPD on 4L oxygen at baseline, T2DM. She was recently admitted at [facility] 11/27-12/11/24 with CHF exacerbation. She presented back to the hospital on 05/01/23 with back pain and feeling weak starting two days prior to admission. She did endorse one episode of emesis  and productive cough of green phlegm for past several days. In ED, she was tachypneic, on home oxygen level of 4L. CT chest abd pelvis with small abdominal wall intramuscular hematoma, multifocal pneumonia and small pleural effusion. GI consulted for GI bleed, MD ordered SLP swallow evaluation to determine need for modified barium swallow study to r/o aspiration.    Assessment / Plan / Recommendation  Clinical Impression  Patient presents with questionable s/s dysphagia as per this bedside swallow evalauation. She did not appear to be a  completely accurate historian as she denied any h/o swallowing difficulties when first asked, but did state she had some coughing at times with PO's a little later in session. SLP did not observe any immediate coughing or throat clearing but patient with delayed, congested and non-productive coughing following PO's of thin liquids and mechanical soft solids. As patient with PNA and MD concern regarding potential aspiration as cause or contributor to PNA, SLP recommending to proceed with MBS. SLP Visit Diagnosis: Dysphagia, unspecified (R13.10)    Aspiration Risk  Mild aspiration risk    Diet Recommendation Other (Comment);Regular;Thin liquid (continue current diet, will make PO recs after MBS)    Liquid Administration via: Cup;Straw Medication Administration: Whole meds with liquid Compensations: Slow rate;Small sips/bites Postural Changes: Seated upright at 90 degrees;Remain upright for at least 30 minutes after po intake    Other  Recommendations Oral Care Recommendations: Oral care BID    Recommendations for follow up therapy are one component of a multi-disciplinary discharge planning process, led by the attending physician.  Recommendations may be updated based on patient status, additional functional criteria and insurance authorization.  Follow up Recommendations Other (comment) (TBD pending MBS)      Assistance Recommended at Discharge    Functional Status Assessment Patient has had a recent decline in their functional status and demonstrates the ability to make significant improvements in function in a reasonable and predictable amount of time.  Frequency and Duration     TBD       Prognosis   TBD     Swallow Study   General Date of Onset: 05/01/23 HPI: Patient is a 77 y.o. female, long term resident at [name]. PMH: GERD, SOB, HFpEF, HLD, Aortic stenosis s/p valve replacement, Sick sinus syndrome, Hypothyroidism, HTN, CKD, COPD on 4L oxygen at baseline, T2DM. She  was recently admitted at [facility] 11/27-12/11/24 with CHF exacerbation. She presented back to the hospital on 05/01/23 with back pain and feeling weak starting two days prior to admission. She did endorse one episode of emesis and productive cough of green phlegm for past several days. In ED, she was tachypneic, on home oxygen level of 4L. CT chest abd pelvis with small abdominal wall intramuscular hematoma, multifocal pneumonia and small pleural effusion. GI consulted for GI bleed, MD ordered SLP swallow evaluation to determine need for modified barium swallow study to r/o aspiration. Type of Study: Bedside Swallow Evaluation Previous Swallow Assessment: none Diet Prior to this Study: Regular;Thin liquids (Level 0) Temperature Spikes Noted: No Respiratory Status: Nasal cannula Behavior/Cognition: Alert;Cooperative;Pleasant mood Oral Cavity Assessment: Within Functional Limits Oral Care Completed by SLP: No Oral Cavity - Dentition: Edentulous;Other (Comment) (has dentures, did not wear) Self-Feeding Abilities: Able to feed self Patient Positioning: Upright in bed Baseline Vocal Quality: Normal Volitional Cough: Congested Volitional Swallow: Able to elicit    Oral/Motor/Sensory Function Overall Oral Motor/Sensory Function: Within functional limits   Ice Chips     Thin Liquid Thin Liquid: Impaired Presentation:  Straw Pharyngeal  Phase Impairments: Other (comments) Other Comments: delayed, congested non productive cough    Nectar Thick     Honey Thick     Puree     Solid     Solid: Impaired Pharyngeal Phase Impairments: Other (comments) Other Comments: delayed, congested non productive cough     Angela Nevin, MA, CCC-SLP Speech Therapy

## 2023-05-02 NOTE — Consult Note (Signed)
WOC Nurse Consult Note: Reason for Consult: Requested to assess wounds on sacrum. Wound type: Stage 1 on sacrum and stage 2 bilateral buttocks, with moisture associated. Pressure Injury POA: Yes Measurement: Sacrum: 3x3cm Bilateral buttock: both with 2x1cm each. On the right side, the blister disrupt. Wound bed: Sacrum: Skin red non bleachable. Bilateral buttocks: disrupt blister on the right side, 100% red. Blister on the left side, intact.  Drainage (amount, consistency, odor) Low amount, no odor. Periwound: moisture Dressing procedure/placement/frequency: Sacrum: apply foam dressing, change Q3. Buttocks: Right: apply petrolatum #239, cover with foam dressing, change Q3 or if is saturated. Left buttock: foam dressing, change Q3  WOC team will not plan to follow further.  Please reconsult if further assistance is needed. Thank-you,  Denyse Amass BSN, RN, [employer], WOC  (Pager: [phone])

## 2023-05-03 ENCOUNTER — Inpatient Hospital Stay (HOSPITAL_COMMUNITY): Payer: 59 | Admitting: Anesthesiology

## 2023-05-03 ENCOUNTER — Ambulatory Visit: Payer: 59 | Admitting: Nurse Practitioner

## 2023-05-03 ENCOUNTER — Encounter (HOSPITAL_COMMUNITY): Payer: Self-pay | Admitting: Family Medicine

## 2023-05-03 ENCOUNTER — Encounter (HOSPITAL_COMMUNITY)
Admission: EM | Disposition: A | Discharge status: Skilled Nursing Facility | Payer: Self-pay | Source: Home / Self Care | Attending: Family Medicine

## 2023-05-03 DIAGNOSIS — K31811 Angiodysplasia of stomach and duodenum with bleeding: Secondary | ICD-10-CM

## 2023-05-03 DIAGNOSIS — K921 Melena: Secondary | ICD-10-CM | POA: Diagnosis not present

## 2023-05-03 DIAGNOSIS — I5022 Chronic systolic (congestive) heart failure: Secondary | ICD-10-CM | POA: Diagnosis not present

## 2023-05-03 DIAGNOSIS — D649 Anemia, unspecified: Secondary | ICD-10-CM | POA: Diagnosis not present

## 2023-05-03 DIAGNOSIS — K31819 Angiodysplasia of stomach and duodenum without bleeding: Secondary | ICD-10-CM

## 2023-05-03 DIAGNOSIS — S301XXA Contusion of abdominal wall, initial encounter: Secondary | ICD-10-CM

## 2023-05-03 DIAGNOSIS — D62 Acute posthemorrhagic anemia: Secondary | ICD-10-CM

## 2023-05-03 DIAGNOSIS — E039 Hypothyroidism, unspecified: Secondary | ICD-10-CM

## 2023-05-03 DIAGNOSIS — K449 Diaphragmatic hernia without obstruction or gangrene: Secondary | ICD-10-CM

## 2023-05-03 HISTORY — PX: HOT HEMOSTASIS: SHX5433

## 2023-05-03 HISTORY — PX: ESOPHAGOGASTRODUODENOSCOPY (EGD) WITH PROPOFOL: SHX5813

## 2023-05-03 LAB — ANTINUCLEAR ANTIBODIES, IFA: ANA Ab, IFA: POSITIVE — AB

## 2023-05-03 LAB — BASIC METABOLIC PANEL
Anion gap: 13 (ref 5–15)
BUN: 60 mg/dL — ABNORMAL HIGH (ref 8–23)
CO2: 25 mmol/L (ref 22–32)
Calcium: 8.7 mg/dL — ABNORMAL LOW (ref 8.9–10.3)
Chloride: 107 mmol/L (ref 98–111)
Creatinine, Ser: 1.48 mg/dL — ABNORMAL HIGH (ref 0.44–1.00)
GFR, Estimated: 36 mL/min — ABNORMAL LOW (ref >60)
Glucose, Bld: 156 mg/dL — ABNORMAL HIGH (ref 70–99)
Potassium: 3.9 mmol/L (ref 3.5–5.1)
Sodium: 145 mmol/L (ref 135–145)

## 2023-05-03 LAB — FANA STAINING PATTERNS: Nuclear Membrane Pattern: 1112 — ABNORMAL HIGH

## 2023-05-03 LAB — CBC
HCT: 31.2 % — ABNORMAL LOW (ref 36.0–46.0)
Hemoglobin: 9.7 g/dL — ABNORMAL LOW (ref 12.0–15.0)
MCH: 29.7 pg (ref 26.0–34.0)
MCHC: 31.1 g/dL (ref 30.0–36.0)
MCV: 95.4 fL (ref 80.0–100.0)
Platelets: 233 10*3/uL (ref 150–400)
RBC: 3.27 MIL/uL — ABNORMAL LOW (ref 3.87–5.11)
RDW: 17.6 % — ABNORMAL HIGH (ref 11.5–15.5)
WBC: 10.2 10*3/uL (ref 4.0–10.5)
nRBC: 0 % (ref 0.0–0.2)

## 2023-05-03 LAB — GLUCOSE, CAPILLARY
Glucose-Capillary: 153 mg/dL — ABNORMAL HIGH (ref 70–99)
Glucose-Capillary: 118 mg/dL — ABNORMAL HIGH (ref 70–99)
Glucose-Capillary: 153 mg/dL — ABNORMAL HIGH (ref 70–99)
Glucose-Capillary: 138 mg/dL — ABNORMAL HIGH (ref 70–99)

## 2023-05-03 LAB — LEGIONELLA PNEUMOPHILA SEROGP 1 UR AG: L. pneumophila Serogp 1 Ur Ag: NEGATIVE

## 2023-05-03 SURGERY — ESOPHAGOGASTRODUODENOSCOPY (EGD) WITH PROPOFOL
Anesthesia: Monitor Anesthesia Care

## 2023-05-03 MED ORDER — IPRATROPIUM-ALBUTEROL 0.5-2.5 (3) MG/3ML IN SOLN
3.0000 mL | Freq: Four times a day (QID) | RESPIRATORY_TRACT | Status: DC | PRN
  Administered 2023-05-03 (×2): 3 mL via RESPIRATORY_TRACT
  Filled 2023-05-03 (×2): qty 3

## 2023-05-03 MED ORDER — PANTOPRAZOLE SODIUM 40 MG PO TBEC
40.0000 mg | DELAYED_RELEASE_TABLET | Freq: Two times a day (BID) | ORAL | Status: DC
Start: 2023-05-03 — End: 2023-05-05
  Administered 2023-05-03 – 2023-05-05 (×4): 40 mg via ORAL
  Filled 2023-05-03 (×4): qty 1

## 2023-05-03 MED ORDER — FUROSEMIDE 10 MG/ML IJ SOLN
60.0000 mg | Freq: Once | INTRAMUSCULAR | Status: AC
  Administered 2023-05-03: 60 mg via INTRAVENOUS
  Filled 2023-05-03: qty 8

## 2023-05-03 MED ORDER — SODIUM CHLORIDE 0.9 % IV SOLN: INTRAVENOUS | Status: DC | PRN

## 2023-05-03 MED ORDER — PROPOFOL 10 MG/ML IV BOLUS
INTRAVENOUS | Status: DC | PRN
  Administered 2023-05-03: 20 mg via INTRAVENOUS
  Administered 2023-05-03: 40 mg via INTRAVENOUS

## 2023-05-03 MED ORDER — SODIUM CHLORIDE 0.9% FLUSH: 3.0000 mL | INTRAVENOUS | Status: DC | PRN

## 2023-05-03 MED ORDER — LIDOCAINE 2% (20 MG/ML) 5 ML SYRINGE
INTRAMUSCULAR | Status: DC | PRN
  Administered 2023-05-03: 40 mg via INTRAVENOUS

## 2023-05-03 MED ORDER — PROPOFOL 500 MG/50ML IV EMUL
INTRAVENOUS | Status: DC | PRN
  Administered 2023-05-03: 50 ug/kg/min via INTRAVENOUS

## 2023-05-03 MED ORDER — SODIUM CHLORIDE 0.9% FLUSH
3.0000 mL | Freq: Two times a day (BID) | INTRAVENOUS | Status: DC
  Administered 2023-05-03: 10 mL via INTRAVENOUS
  Administered 2023-05-04: 3 mL via INTRAVENOUS
  Administered 2023-05-04 – 2023-05-05 (×2): 10 mL via INTRAVENOUS

## 2023-05-03 SURGICAL SUPPLY — 14 items

## 2023-05-03 NOTE — Progress Notes (Signed)
NAME:  Tonya Glass, MRN:  [id number], DOB:  [birth year], LOS: 2 ADMISSION DATE:  04/30/2023, CONSULTATION DATE:  05/01/23 REFERRING MD:  Linwood Dibbles CHIEF COMPLAINT:  Dyspnea   History of Present Illness:  Tonya Glass is a 77 y.o. female who has a PMH including but not limited to sCHF (Echo from Nov 2024 with EF 35-40%), AS s/p AVR, sick sinus syndrome, HTN, Hypothyroidism, CKD IV, DM2, COPD on 2-4L O2 (PFt's with normal function but some air trapping and hyperinflation). She presented to [facility] ED 12/18 with progressive weakness. She had recent admission 11/27 through 12/11 for what was presumed to be CHF exacerbation s/p diuresis followed by normal right heart cath. Her diuretic regimen was decreased (had been on Lasix gtt) and she was discharged on Torsemide 60mg  BID with instructions to follow up as an outpatient.  In ED, she was hypoxic at her baseline with 2-4L O2 requirement. CT C/A/P demonstrated multifocal PNA with GGO's bilaterally and small bilateral effusions and RLL consolidation. She was started on CTX/Azithro coverage. She endorsed intermittent productive cough and intermittent pleuritic left sided chest pain. Denies any recent fevers/chills/sweats, chest pain, N/V/D, abd pain, myalgias.  On prior admission, CXR showed bilateral interstitial opacities. Given CT this admission with ongoing bilateral opacities, PCCM was called to see in consultation.   Pertinent  Medical History:  has Pacemaker; Essential hypertension; Dyslipidemia; S/P aortic valve replacement with bioprosthetic valve; T2DM (type 2 diabetes mellitus) (HCC); Chronic heart failure (HCC); H/O aortic root repair; Atherosclerosis of native coronary artery of native heart without angina pectoris; SSS (sick sinus syndrome) (HCC); Diastolic murmur; Aortic valve stenosis; Hypothyroidism; Acute on chronic hypoxic respiratory failure (HCC); Acute CHF (congestive heart failure) (HCC); Acute diastolic CHF (congestive heart failure)  (HCC); CHF (congestive heart failure) (HCC); Thoracic aortic aneurysm without rupture (HCC); Nonrheumatic aortic (valve) insufficiency; Venous stasis ulcers of both lower extremities (HCC); Second degree atrioventricular block; Aortic prosthetic valve regurgitation; Paroxysmal atrial flutter (HCC); Nonrheumatic mitral valve regurgitation; Paravalvular leak (prosthetic valve); Cor pulmonale, chronic (HCC); PAH (pulmonary artery hypertension) (HCC); Peripheral venous insufficiency; Stage 3b chronic kidney disease (HCC); Mixed simple and mucopurulent chronic bronchitis (HCC); Pressure injury of skin; Acute exacerbation of CHF (congestive heart failure) (HCC); Acute kidney injury 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); Anemia; COPD (chronic obstructive pulmonary disease) (HCC); GERD (gastroesophageal reflux disease); Acute systolic heart failure (HCC); Pulmonary hypertension, unspecified (HCC); Concern for multidrug resistant pneumonia; SOB (shortness of breath); Symptomatic anemia; HFrEF (heart failure with reduced ejection fraction) (HCC); Prolonged QT interval; Lumbar compression fracture (HCC); Heme positive stool; Left ventricular thrombus; Closed compression fracture of second lumbar vertebra (HCC); Gastrointestinal hemorrhage with melena; Sacral wound; and Hematoma of right flank on their problem list.  Significant Hospital Events: Including procedures, antibiotic start and stop dates in addition to other pertinent events   12/18 admit  Interim History / Subjective:   No complaints today.  On supplemental oxygen at 2 L Awaiting EGD later today  Objective:  Blood pressure (!) 130/52, pulse 67, temperature 98.1 F (36.7 C), temperature source Oral, resp. rate 17, height 5' (1.524 m), weight 100.3 kg, SpO2 96%.    PEEP:  [5 cmH20] 5 cmH20  No intake or output data in the 24 hours ending 05/03/23 0938  Filed Weights   04/30/23 1946 05/02/23 0529  Weight: 96 kg 100.3 kg    Examination: Gen:      No acute distress. Elderly HEENT:  EOMI, sclera anicteric Neck:     No masses; no  thyromegaly Lungs:    Clear to auscultation bilaterally; normal respiratory effort CV:         Regular rate and rhythm; no murmurs Abd:      + bowel sounds; soft, non-tender; no palpable masses, no distension Ext:    No edema; adequate peripheral perfusion Skin:      Warm and dry; no rash Neuro: alert and oriented x 3 Psych: normal mood and affect   Labs/imaging personally reviewed:  CT C/A/P 12/18 > multifocal PNA with GGO's bilaterally and small bilateral effusions and RLL consolidation  Assessment & Plan:   Acute on chronic hypoxic respiratory failure - CT shows persistent bilateral opacities that were evident on CXR last admission. Unclear etiology but broad differential. Favor multifocal PNA in pt from SNF with weak cough etc. She did have recent admission with CAP coverage; therefore, this could be slow to resolve PNA needing longer treatment. Atelectasis - likely from hypoinflation in setting dyspnea + back pain from lumbar compression fx. Hx mild COPD though 2-4L O2 dependent at baseline (followed by Dr. Celine Mans).  - Continue supplemental O2 as needed to maintain SpO2 > 92%.  She is at her baseline LPM - Continue antibiotic coverage - Low suspicion for interstitial lung disease.  ANA is borderline positive.  Will await remainder of her labs to come back - Will need follow-up imaging to make sure there is no underlying process. - Diuresis as able - Mobilize, continue bronchodilators, incentive spirometry - Speech eval is pending for evaluation of aspiration.  Will follow back on 12/23.  Please call over the weekend with any questions.  Best practice (evaluated daily):   Per primary team.  Signature:   Chilton Greathouse MD [facility] See Amion for pager  If no response to pager , please call [phone] until 7pm After 7:00 pm call Elink   [phone] 05/03/2023, 9:38 AM

## 2023-05-03 NOTE — Plan of Care (Signed)
FMTS Interim Progress Note  Patient assessed at bedside after endoscopy.  Daughter Waynetta Sandy and son-in-law present.  Patient reports feeling short of breath, occurred after nurse was changing her bed.  Reports she has had some episodes of dyspnea since her procedure.  Denies any further blood in her stool or melena.  Urinating appropriately and eating small amounts.  Assisted patient to sit upright in chair.  Family updated on care plan, pending further pulmonology workup to guide care.  O: BP (!) 156/59 (BP Location: Right Arm)   Pulse 64   Temp 97.8 F (36.6 C) (Oral)   Resp 17   Ht 5' (1.524 m)   Wt 100.3 kg   SpO2 95%   BMI 43.18 kg/m    General.  Mildly labored breathing with exertion.  Some anxiety noted CV: Systolic murmur.  RRR. Resp: Diffuse wheezing and coarse breath sounds.  Mildly labored breathing on 2 L nasal cannula  A/P: Acute on chronic hypoxic respiratory failure Dyspnea on exertion but returns to baseline respiratory status with rest.  Possible component of anxiety but patient settled well once sitting upright in recliner.  Will continue to monitor volume status closely and advance diuresis if indicated.  Pending pulmonology workup to further direct care. -BiPAP qhs, night team to see -F/u pulm labs  Remainder of plan per day team note.   Elberta Fortis, MD 05/03/2023, 6:26 PM PGY-2, [facility] Family Medicine Service pager [phone]

## 2023-05-03 NOTE — Progress Notes (Addendum)
Speech Language Pathology Treatment:    Patient Details Name: Tonya Glass MRN: [id number] DOB: 30-Sep-1945 Today's Date: 05/03/2023 Time:  -      Pt refused MBS yesterday but states today she is willing to do it however she had EGD scheduled at 3:00 today. The GI PA-C is going to see if EGD can be performed earlier today so MBS can be done after. Will follow for scheduling.   UPDATE: GI PA-C stated her EGD will not be able to be performed until later this afternoon, therefore will be unable to go for MBS. Will attempt to schedule MBS over the weekend however cannot be certain this can be accommodated. Pt ok to remain on current diet- there was delayed coughing during initial swallow assessment and MBS was recommended to further assess swallow function.     05/03/2023, 8:46 AM

## 2023-05-03 NOTE — Progress Notes (Signed)
Daily Progress Note Intern Pager: [phone]  Patient name: Tonya Glass Medical record number: [id number] Date of birth: [birth year] Age: 77 y.o. Gender: female  Primary Care Provider: No primary care provider on file. Consultants: GI, Cardiology, Pulmonology, Wound care  Code Status: DNR  Pt Overview and Major Events to Date:  12/18: admitted, s/p 2U pRBCS  Assessment and Plan: Patient is a 77 year old female with extensive past medical historyof HFrEF, CKD stage IV, COPD on home 2L admitted for anemia likely 2/2 to GI bleed.   Assessment & Plan Symptomatic anemia Hemoglobin this morning 9.7, remained stable from yesterday at 9.6.  GI following and planning to do endoscopy later today.   - GI following, likely endoscopy later today - NPO pending endoscopy  - daily CBC - continue to hole eliquis  - continue protonix 40 mg BID (12/18 - ), carafate 1g QID - transfusion threshold hbg<8 given A fib   Concern for multidrug resistant pneumonia Pulmonology following,  who believe this may be multifocal PNA but are completing work up to rule out autoimmune process. Patient declined barium swallow, though there is some concern for aspiration pneumonitis. MRSA swab negative - pulm following, appreciate recs - continue antibiotic coverage with  -Will obtain barium swallow to rule out possibility of aspiration pneumonitis -Pulmonology following, appreciate recs -Follow-up MRSA swab SLP eval with modified barium swallow to rule out aspiration -Will continue antibiotic treatment with ceftriaxone 2 g (12/17- 12/21 ) and azithromycin (12/18 -12/20 )    HFrEF (heart failure with reduced ejection fraction) (HCC) Patient is now status post IV diuresis with 60 mg Lasix. Will diuresis patient with another dose of IV lasix 60mg .  -Another dose of IV Lasix 60 mg today -Cardiology following, appreciate recs - strict I&Os, daily weights - continue carvedilol 3.125 mg twice daily   Acute  kidney injury 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) Creatinine continues to trend down.  Will continue to monitor while she is admitted. Patient is likely approaching baseline.  - AM BMP COPD (chronic obstructive pulmonary disease) (HCC) Patient still complaining of shortness of breath. Will order prn duonebs. Continue BiPAP nightly.  - prn duonebs q6h - nightly BiPAP -Continue Dulera 2 puffs twice daily - continue mucinex for cough  - titrate O2 to 88-92%, on home 2L   Sacral wound Nursing noted sacral skin breakdown.  Wound care nurse already consulted.  Will follow-up for recommendations regarding skin breakdown - WOC consult  T2DM (type 2 diabetes mellitus) (HCC) A1c 6.0, discharged on 6 units LAI daily and SSI. -CBGs Q6h while NPO -No LAI while NPO, dose as needed based on CBGs with goal 140-180 -Sensitive SSI -Still holding Farxiga from last admission as noted above  Chronic and Stable Problems:  A-fib: Continue holding Eliquisin setting of GI bleed  Type 2 diabetes: Continue SSI  hypothyroidism: Continue levothyroxine 75 mcg daily Depression: Continue fluoxetine 40 mg daily, trazodone 25 mg nightly Peripheral neuropathy: Continue gabapentin 600 mg nightly Osteoporosis: Continue cholecalciferol 2000 IU daily Hyperlipidemia: Continue home atorvastatin 40 mg daily HTN: Continue Imdur 50 mg daily, hydralazine 25 mg every 8 hours   FEN/GI: N.p.o. pending procedure PPx: SCDs in setting of acute blood Dispo: SNF versus home with palliative care pending clinical improvement  Subjective:  Reports she is feeling okay this morning.  She reports she did have some shortness of breath earlier but is feeling much better.  Patient has no acute distress at this time  Objective: Temp:  [  97.4 F (36.3 C)-98.5 F (36.9 C)] 98.5 F (36.9 C) (12/20 0441) Pulse Rate:  [60-70] 67 (12/20 0441) Resp:  [16-18] 16 (12/20 0441) BP: (128-163)/(40-50) 163/50 (12/20 0441) SpO2:   [93 %-100 %] 97 % (12/20 0441) Physical Exam: General: Elderly obese female, resting in bed comfortably, NAD Cardiovascular: Paced rhythm, prosthetic valve murmur Respiratory: Mild wheezing bilaterally, normal work of breathing on 2 L [place] Abdomen: Obese, soft, nontender nondistended Extremities: No edema noted bilaterally  Laboratory: Most recent CBC Lab Results  Component Value Date   WBC 10.2 05/03/2023   HGB 9.7 (L) 05/03/2023   HCT 31.2 (L) 05/03/2023   MCV 95.4 05/03/2023   PLT 233 05/03/2023   Most recent BMP    Latest Ref Rng & Units 05/03/2023    3:32 AM  BMP  Glucose 70 - 99 mg/dL 454   BUN 8 - 23 mg/dL 60   Creatinine 0.98 - 1.00 mg/dL 1.19   Sodium 147 - 829 mmol/L 145   Potassium 3.5 - 5.1 mmol/L 3.9   Chloride 98 - 111 mmol/L 107   CO2 22 - 32 mmol/L 25   Calcium 8.9 - 10.3 mg/dL 8.7     Other pertinent labs  Strep pneumo urinary antigen: negative Aldolase: 7.9 MRSA: negative C3: 116, C4: 33  Scl- 7- Igg: <0.2    Imaging/Diagnostic Tests: No new imaging  Georg Ruddle Babe Clenney, MD 05/03/2023, 7:19 AM  PGY-1, [facility] FPTS Intern pager: [phone], text pages welcome Secure chat group [facility] Teaching Service

## 2023-05-03 NOTE — Assessment & Plan Note (Addendum)
Patient still complaining of shortness of breath. Will order prn duonebs. Continue BiPAP nightly.  - prn duonebs q6h - nightly BiPAP -Continue Dulera 2 puffs twice daily - continue mucinex for cough  - titrate O2 to 88-92%, on home 2L

## 2023-05-03 NOTE — Transfer of Care (Signed)
Immediate Anesthesia Transfer of Care Note  Patient: Tonya Glass  Procedure(s) Performed: ESOPHAGOGASTRODUODENOSCOPY (EGD) WITH PROPOFOL HOT HEMOSTASIS (ARGON PLASMA COAGULATION/BICAP)  Patient Location: PACU and Endoscopy Unit  Anesthesia Type:MAC  Level of Consciousness: sedated  Airway & Oxygen Therapy: Patient Spontanous Breathing and Patient connected to nasal cannula oxygen  Post-op Assessment: Report given to RN and Post -op Vital signs reviewed and stable  Post vital signs: Reviewed and stable  Last Vitals:  Vitals Value Taken Time  BP    Temp    Pulse    Resp    SpO2      Last Pain:  Vitals:   05/03/23 1136  TempSrc: Temporal  PainSc: 0-No pain         Complications: No notable events documented.

## 2023-05-03 NOTE — Plan of Care (Signed)

## 2023-05-03 NOTE — Assessment & Plan Note (Addendum)
Creatinine continues to trend down.  Will continue to monitor while she is admitted. Patient is likely approaching baseline.  - AM BMP

## 2023-05-03 NOTE — Assessment & Plan Note (Signed)
 A1c 6.0, discharged on 6 units LAI daily and SSI. -CBGs Q6h while NPO -No LAI while NPO, dose as needed based on CBGs with goal 140-180 -Sensitive SSI -Still holding Farxiga from last admission as noted above

## 2023-05-03 NOTE — Progress Notes (Signed)
   05/03/23 0010  BiPAP/CPAP/SIPAP  $ Non-Invasive Ventilator  Non-Invasive Vent Set Up;Non-Invasive Vent Initial  $ Face Mask Medium Yes  BiPAP/CPAP/SIPAP Pt Type Adult  BiPAP/CPAP/SIPAP Resmed  Mask Type Full face mask  Mask Size Medium  PEEP 5 cmH20  Flow Rate 4 lpm  Patient Home Equipment No  Auto Titrate No

## 2023-05-03 NOTE — Procedures (Addendum)
I spoke with the patient's daughter Debara Pickett by phone after procedure Updated her about the findings and plan Time provided for questions and answers and she thanked me for the call

## 2023-05-03 NOTE — Assessment & Plan Note (Addendum)
Patient is now status post IV diuresis with 60 mg Lasix. Will diuresis patient with another dose of IV lasix 60mg .  -Another dose of IV Lasix 60 mg today -Cardiology following, appreciate recs - strict I&Os, daily weights - continue carvedilol 3.125 mg twice daily

## 2023-05-03 NOTE — Progress Notes (Signed)
Patient got winded after pericare. O2 increased to 4L, and was satting 93%. Patient c/o SOB, wheezing and crackles heard upon auscultation. Family is at bedside and education was provided.  Duoneb administered, patient verbalized relief and breathing better.  Provider a bedside, endorsed anxiety. RN asked provider for anxiolytic, and tessalon as patient had strong unproductive cough.  Patient moved from the bed to the chair. Patient comfortable, O2 weaned down to 2L, sats at 92%. Ongoing care plan.    05/03/23 1754  Oxygen Therapy  SpO2 93 %  O2 Device Nasal Cannula  O2 Flow Rate (L/min) 4 L/min

## 2023-05-03 NOTE — Assessment & Plan Note (Deleted)
 QTcB 510 and receiving pantoprazole 40 mg IV BID.  However, not on any other QT prolonging meds for pantoprazole to interact with currently and benefits outweigh risks in setting of suspected UGIB. -f/u repeat EKG -Avoid QT prolonging meds

## 2023-05-03 NOTE — Assessment & Plan Note (Addendum)
Pulmonology following,  who believe this may be multifocal PNA but are completing work up to rule out autoimmune process. Patient declined barium swallow, though there is some concern for aspiration pneumonitis. MRSA swab negative - pulm following, appreciate recs - continue antibiotic coverage with  -Will obtain barium swallow to rule out possibility of aspiration pneumonitis -Pulmonology following, appreciate recs -Follow-up MRSA swab SLP eval with modified barium swallow to rule out aspiration -Will continue antibiotic treatment with ceftriaxone 2 g (12/17- 12/21 ) and azithromycin (12/18 -12/20 )

## 2023-05-03 NOTE — Progress Notes (Signed)
Patient Name: Tonya Glass Date of Encounter: 05/03/2023 [facility] Cardiologist: Chrystie Nose, MD   Interval Summary  .    Feeling better.  Breathing improving.   Vital Signs .    Vitals:   05/03/23 0441 05/03/23 0815 05/03/23 0817 05/03/23 0817  BP: (!) 163/50 (!) 130/52  (!) 130/52  Pulse: 67  65 67  Resp: 16 17 18 17   Temp: 98.5 F (36.9 C) 98.1 F (36.7 C)  98.1 F (36.7 C)  TempSrc: Oral Oral  Oral  SpO2: 97% 96% 97% 96%  Weight:      Height:       No intake or output data in the 24 hours ending 05/03/23 1005     05/02/2023    5:29 AM 04/30/2023    7:46 PM 04/24/2023    7:23 AM  Last 3 Weights  Weight (lbs) 221 lb 1.9 oz 211 lb 10.3 oz 211 lb 10.3 oz  Weight (kg) 100.3 kg 96 kg 96 kg      Telemetry/ECG    APVP - Personally Reviewed  Physical Exam .    VS:  BP (!) 130/52 (BP Location: Right Leg)   Pulse 67   Temp 98.1 F (36.7 C) (Oral)   Resp 17   Ht 5' (1.524 m)   Wt 100.3 kg   SpO2 96%   BMI 43.18 kg/m  , BMI Body mass index is 43.18 kg/m. GENERAL:  Chronically ill-appearing.  No acute distress.  HEENT: Pupils equal round and reactive, fundi not visualized, oral mucosa unremarkable NECK:  No jugular venous distention, waveform within normal limits, carotid upstroke brisk and symmetric, no bruits, no thyromegaly LUNGS:  Diminished breath sounds bilaterally.  No crackles or rhonchi HEART:  RRR.  PMI not displaced or sustained,S1 and S2 within normal limits, no S3, no S4, no clicks, no rubs, II/VI systolic murmur at LUSB ABD:  Flat, positive bowel sounds normal in frequency in pitch, no bruits, no rebound, no guarding, no midline pulsatile mass, no hepatomegaly, no splenomegaly EXT:  2 plus pulses throughout, trace bilateral LE edema, no cyanosis no clubbing SKIN:  Pallor.  Stasis dermatitis.  No rashes no nodules NEURO:  Cranial nerves II through XII grossly intact, motor grossly intact throughout PSYCH:  Cognitively intact,  oriented to person place and time   Assessment & Plan .     Tonya Glass is a 70F with HFrEF, atrial flutter, aortic root repair, bioprosthetic AVR, PPM, non-obstructive CAD, hypoxic respiratory failure on home O2, suspected OHS, refusal of sleep study, cor pulmonale, LV thrombus admitted with acute on chronic HFrEF and anemia.   # HFrEF:  # RV failure: # Moderate pleural effusions: LVEF 35-40%.  Current volume status improving with diuresis.  Renal function improved with IV lasix yesterday.  Will give another dose today. Continue carvedilol, hydralazine and Imdur.    # Anemia:  Imaging concerning for small intramuscular abdominal wall hematoma.  Unclear how much this is contributing to her anemia.  FOBT +.  She is going for EGD today.  Holding home Eliquis.  H/H stable today after transfusion.    # Elevated troponin: # Chest pain:  Challenging to characterize her chest pain.  HS troponin is mildly elevated and flat.  More consistent with demand ischemia and she isn't a cath candidate at this time given her GI bleed and CKD IV.      # Hypertension: Continue carvedilol, hydralazine and Imdur.    # Atrial flutter:  Currently in sinus rhythm.  Eliquis on hold.  Continue carvedilol.  # OSA/OHS: Continue to recommend outpatient sleep evaluation.   # LV apical thrombus:  Noted on prior echo.  Eliquis on hold for now.  No indication for TEE if the thrombus was well-seen on TTE.   For questions or updates, please contact [facility] Please consult [email or web address] for contact info under        Signed, Chilton Si, MD

## 2023-05-03 NOTE — Assessment & Plan Note (Deleted)
 Will hold Eliquis 5 mg BID given suspected UGIB, restart per GI recs. -Follow up TTE to evaluate status of LV apical thrombus previously noted on TTE 11/28 -Consult Cardiology in AM -AM BMP; goal K>4, mag>2

## 2023-05-03 NOTE — Assessment & Plan Note (Addendum)
Hemoglobin this morning 9.7, remained stable from yesterday at 9.6.  GI following and planning to do endoscopy later today.   - GI following, likely endoscopy later today - NPO pending endoscopy  - daily CBC - continue to hole eliquis  - continue protonix 40 mg BID (12/18 - ), carafate 1g QID - transfusion threshold hbg<8 given A fib

## 2023-05-03 NOTE — Assessment & Plan Note (Deleted)
 Suspect acute fracture d/t osteoporosis in setting of physical therapy.  Currently wearing TLSO brace and pain is minimal.Will reach out to neurosurgery considering patient denies any trauma. - neurosurgery consulted, appreciate recs - Will add calcitonin spray daily  - continue tylenol 650 m q5h  -Nursing order for activity as tolerated with brace -PT/OT eval and treat

## 2023-05-03 NOTE — Plan of Care (Addendum)
EGD done; bleeding noted and cauterization done. On 2L [place], Bipap at hs, Lasix adminsitered.   Problem: Education: Goal: Ability to describe self-care measures that may prevent or decrease complications (Diabetes Survival Skills Education) will improve Outcome: Not Met (add Reason) Goal: Individualized Educational Video(s) Outcome: Not Met (add Reason)   Problem: Coping: Goal: Ability to adjust to condition or change in health will improve Outcome: Not Met (add Reason)   Problem: Fluid Volume: Goal: Ability to maintain a balanced intake and output will improve Outcome: Not Met (add Reason)   Problem: Health Behavior/Discharge Planning: Goal: Ability to identify and utilize available resources and services will improve Outcome: Not Met (add Reason) Goal: Ability to manage health-related needs will improve Outcome: Not Met (add Reason)   Problem: Metabolic: Goal: Ability to maintain appropriate glucose levels will improve Outcome: Not Met (add Reason)   Problem: Nutritional: Goal: Maintenance of adequate nutrition will improve Outcome: Not Met (add Reason) Goal: Progress toward achieving an optimal weight will improve Outcome: Not Met (add Reason)   Problem: Skin Integrity: Goal: Risk for impaired skin integrity will decrease Outcome: Not Met (add Reason)   Problem: Tissue Perfusion: Goal: Adequacy of tissue perfusion will improve Outcome: Not Met (add Reason)   Problem: Education: Goal: Knowledge of General Education information will improve Description: Including pain rating scale, medication(s)/side effects and non-pharmacologic comfort measures Outcome: Not Met (add Reason)   Problem: Health Behavior/Discharge Planning: Goal: Ability to manage health-related needs will improve Outcome: Not Met (add Reason)   Problem: Clinical Measurements: Goal: Ability to maintain clinical measurements within normal limits will improve Outcome: Not Met (add Reason) Goal: Will  remain free from infection Outcome: Not Met (add Reason) Goal: Diagnostic test results will improve Outcome: Not Met (add Reason) Goal: Respiratory complications will improve Outcome: Not Met (add Reason) Goal: Cardiovascular complication will be avoided Outcome: Not Met (add Reason)   Problem: Activity: Goal: Risk for activity intolerance will decrease Outcome: Not Met (add Reason)   Problem: Nutrition: Goal: Adequate nutrition will be maintained Outcome: Not Met (add Reason)   Problem: Coping: Goal: Level of anxiety will decrease Outcome: Not Met (add Reason)   Problem: Elimination: Goal: Will not experience complications related to bowel motility Outcome: Not Met (add Reason) Goal: Will not experience complications related to urinary retention Outcome: Not Met (add Reason)   Problem: Pain Management: Goal: General experience of comfort will improve Outcome: Not Met (add Reason)   Problem: Safety: Goal: Ability to remain free from injury will improve Outcome: Not Met (add Reason)   Problem: Skin Integrity: Goal: Risk for impaired skin integrity will decrease Outcome: Not Met (add Reason)

## 2023-05-03 NOTE — Assessment & Plan Note (Signed)
 Nursing noted sacral skin breakdown.  Wound care nurse already consulted.  Will follow-up for recommendations regarding skin breakdown - WOC consult

## 2023-05-03 NOTE — Progress Notes (Signed)
Progress Note  Primary GI: Dr. Myrtie Neither DOA: 04/30/2023         Hospital Day: 4   Subjective  Chief Complaint: Anemia in setting of multifocal pneumonia   No family was present at the time of my evaluation. Was lying in bed, NAD. States her breathing is about the same, has nasal cannula on. Denies any nausea, vomiting, GERD, abdominal pain. Going for MBS at some point today or tomorrow, denies coughing with food/liquids.    Objective   Vital signs in last 24 hours: Temp:  [97.4 F (36.3 C)-98.5 F (36.9 C)] 98.1 F (36.7 C) (12/20 0817) Pulse Rate:  [60-70] 67 (12/20 0817) Resp:  [16-18] 17 (12/20 0817) BP: (128-163)/(44-52) 130/52 (12/20 0817) SpO2:  [93 %-100 %] 96 % (12/20 0817) Last BM Date : 05/01/23 Last BM recorded by nurses in past 5 days No data recorded  General:   Pleasant, well developed female in no acute distress Heart:  regular rate and rhythm, systolic murmur Pulm: 2 L oxygen with wheezing/rhonchi Abdomen:  Soft, Obese AB, Active bowel sounds. No tenderness . Without guarding and Without rebound, No organomegaly appreciated. Extremities:  With 1+ edema bilateral legs with ecchymoses Msk:  Symmetrical without gross deformities. Peripheral pulses intact.   Neurologic:  Alert and  oriented x4;  No focal deficits.  Skin: Ecchymoses along bilateral legs and arms.  No rashes Psychiatric:  Cooperative. Normal mood and affect.  Intake/Output from previous day: No intake/output data recorded. Intake/Output this shift: No intake/output data recorded.  Studies/Results: No results found.   Lab Results: Recent Labs    05/02/23 0714 05/02/23 1642 05/03/23 0332  WBC 9.1 10.8* 10.2  HGB 9.2* 9.6* 9.7*  HCT 29.4* 30.1* 31.2*  PLT 217 229 233   BMET Recent Labs    05/01/23 0415 05/02/23 0714 05/03/23 0332  NA 138 140 145  K 4.5 3.8 3.9  CL 103 105 107  CO2 25 28 25   GLUCOSE 178* 164* 156*  BUN 72* 66* 60*  CREATININE 2.04* 1.71* 1.48*  CALCIUM  8.2* 8.1* 8.7*   LFT Recent Labs    04/30/23 2006  PROT 6.0*  ALBUMIN 2.8*  AST 19  ALT 16  ALKPHOS 72  BILITOT 1.2*   PT/INR Recent Labs    04/30/23 2006  LABPROT 21.6*  INR 1.9*     Scheduled Meds:  acetaminophen  650 mg Oral Q6H   atorvastatin  40 mg Oral Daily   carvedilol  3.125 mg Oral BID   cholecalciferol  1,000 Units Oral Daily   FLUoxetine  40 mg Oral Daily   furosemide  60 mg Intravenous Once   gabapentin  600 mg Oral QHS   guaiFENesin  600 mg Oral BID   hydrALAZINE  25 mg Oral Q8H   insulin aspart  0-9 Units Subcutaneous TID AC   ipratropium  2 spray Each Nare TID   isosorbide mononitrate  15 mg Oral Daily   levothyroxine  75 mcg Oral Daily   mometasone-formoterol  2 puff Inhalation BID   pantoprazole (PROTONIX) IV  40 mg Intravenous BID   sucralfate  1 g Oral TID WC & HS   traZODone  25 mg Oral QHS   Continuous Infusions:  cefTRIAXone (ROCEPHIN)  IV 2 g (05/02/23 2218)     Impression/Plan:   Acute on chronic symptomatic anemia on Eliquis Hemoglobin 6.9 on repeat, baseline appears to be between 8 and 9 FOBT positive, s/p 2 units PRBC HGB 9.2, increased  today at 9.7  BUN elevation out of portion to creatinine 01/2016 colonoscopy due to positive FIT diminutive hyperplastic polyp CT chest abdomen pelvis 2.3 x 1.2 cm intramuscular abdominal wall hematoma EGD today for likely UGI bleed in setting of eliquis Continue NPO, hold eliquis Will schedule EGD. I discussed risks of EGD with patient today, including risk of sedation, bleeding or perforation.  Patient provides understanding and gave verbal consent to proceed.  Atrial flutter on Eliquis Last dose 12/19 Continue to hold   CKD Resolved, back at baseline 1.71, BUN 66 ( 71) still elevated   Multifocal pneumonia in setting of COPD with OSA overlap, cor pulmonale and chronic right heart failure CT chest shows advanced multifocal lung opacities with groundglass and nodular configuration suspicious  for multifocal pneumonia, small left and small to moderate right pleural effusions No leukocytosis Negative respiratory panel, pending strep and Legionella Patient denies oropharyngeal symptoms or signs of aspiration Will be evaluated with MBS to rule out aspiration Patient on Rocephin and azithromycin At baseline    Morbid obesity   Memory loss Appears to be oriented at this time   Insulin-dependent type 2 diabetes  Principal Problem:   Symptomatic anemia Active Problems:   T2DM (type 2 diabetes mellitus) (HCC)   Paroxysmal atrial flutter (HCC)   Acute kidney injury 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)   COPD (chronic obstructive pulmonary disease) (HCC)   Concern for multidrug resistant pneumonia   HFrEF (heart failure with reduced ejection fraction) (HCC)   Prolonged QT interval   Lumbar compression fracture (HCC)   Heme positive stool   Left ventricular thrombus   Closed compression fracture of second lumbar vertebra (HCC)   Gastrointestinal hemorrhage with melena   Sacral wound    LOS: 2 days   Tonya Glass  05/03/2023, 8:34 AM

## 2023-05-03 NOTE — Op Note (Signed)
[facility] Patient Name: Tonya Glass Procedure Date : 05/03/2023 MRN: [id number] Attending MD: Beverley Fiedler , MD, [id number] Date of Birth: 1946-01-24 CSN: [id number] Age: 77 Admit Type: Inpatient Procedure:                Upper GI endoscopy Indications:              Acute post hemorrhagic anemia, Heme positive stool Providers:                Carie Caddy. Rhea Belton, MD, Suzy Bouchard, RN, Alan Ripper,                            Technician Referring MD:             [facility] Medicine Teaching Service Medicines:                Monitored Anesthesia Care Complications:            No immediate complications. Estimated Blood Loss:     Estimated blood loss: none. Procedure:                Pre-Anesthesia Assessment:                           - Prior to the procedure, a History and Physical                            was performed, and patient medications and                            allergies were reviewed. The patient's tolerance of                            previous anesthesia was also reviewed. The risks                            and benefits of the procedure and the sedation                            options and risks were discussed with the patient.                            All questions were answered, and informed consent                            was obtained. Prior Anticoagulants: The patient has                            taken Eliquis (apixaban), last dose was 3 days                            prior to procedure. ASA Grade Assessment: III - A                            patient with severe systemic disease. After  reviewing the risks and benefits, the patient was                            deemed in satisfactory condition to undergo the                            procedure.                           After obtaining informed consent, the endoscope was                            passed under direct vision. Throughout the                             procedure, the patient's blood pressure, pulse, and                            oxygen saturations were monitored continuously. The                            [id number]-H190 ([id number]) Olympus endoscope was introduced                            through the mouth, and advanced to the second part                            of duodenum. The upper GI endoscopy was                            accomplished without difficulty. The patient                            tolerated the procedure well. Scope In: Scope Out: Findings:      The lower third of the esophagus was mildly tortuous.      A 4 cm hiatal hernia was present.      The gastroesophageal flap valve was visualized endoscopically and       classified as Hill Grade IV (no fold, wide open lumen, hiatal hernia       present).      A small amount of pill fragments found in the cardia at hiatal hernia       sac.      Two 2 mm angioectasias, one with bleeding were found in the cardia.       Fulguration to stop the bleeding by argon plasma at 1 liter/minute and       20 watts was successful.      The exam of the stomach was otherwise normal.      The examined duodenum was normal. Impression:               - Tortuous esophagus.                           - 4 cm hiatal hernia.                           -  Two angioectasias, 1 with bleeding, found in the                            stomach (gastric cardia near diaphragmatic hiatus).                            Treated with argon plasma coagulation (APC).                           - Normal examined duodenum.                           - No specimens collected. Moderate Sedation:      N/A Recommendation:           - Return patient to hospital ward for ongoing care.                           - Advance diet as tolerated.                           - Continue present medications.                           - Monitor Hgb, if stable can resume DOAC in 48                            hours.                            - BID PPI x 4 weeks, then daily given hiatal hernia                            and DOAC.                           - GI will sign off, call if questions. Procedure Code(s):        --- Professional ---                           [phone], Esophagogastroduodenoscopy, flexible,                            transoral; with control of bleeding, any method Diagnosis Code(s):        --- Professional ---                           Q39.9, Congenital malformation of esophagus,                            unspecified                           K44.9, Diaphragmatic hernia without obstruction or                            gangrene  K31.811, Angiodysplasia of stomach and duodenum                            with bleeding                           D62, Acute posthemorrhagic anemia                           R19.5, Other fecal abnormalities CPT copyright 2022 [employer]. All rights reserved. The codes documented in this report are preliminary and upon coder review may  be revised to meet current compliance requirements. Beverley Fiedler, MD 05/03/2023 1:16:36 PM This report has been signed electronically. Number of Addenda: 0

## 2023-05-03 NOTE — Anesthesia Preprocedure Evaluation (Addendum)
Anesthesia Evaluation  Patient identified by MRN, date of birth, ID band Patient awake    Reviewed: Allergy & Precautions, NPO status , Patient's Chart, lab work & pertinent test results  Airway Mallampati: II  TM Distance: >3 FB Neck ROM: Full    Dental  (+) Poor Dentition, Chipped, Missing, Dental Advisory Given   Pulmonary COPD,  COPD inhaler, former smoker   Pulmonary exam normal breath sounds clear to auscultation       Cardiovascular hypertension, + CAD, +CHF and + Orthopnea  Normal cardiovascular exam+ dysrhythmias + pacemaker + Valvular Problems/Murmurs (s/p AVR) AS  Rhythm:Regular Rate:Normal  TTE 2024 1. Left ventricular ejection fraction, by estimation, is 35 to 40%. The  left ventricle has moderately decreased function. The left ventricle  demonstrates regional wall motion abnormalities with hypokinesis of the  mid to distal inferoseptal wall and  distal anteroseptal wall; however, complete wall motion assessment limited  by poor visualization of endocardial borders and off axis angles. There is  a small LV apical thrombus.   2. Right ventricular systolic function is normal. The right ventricular  size is normal.   3. Left atrial size was mildly dilated.   4. The mitral valve is normal in structure. Mild mitral valve  regurgitation. No evidence of mitral stenosis.   5. Well seated bioprosthetic aortic valve with no paravalvular  regurgitation. Aortic valve mean gradient measures 19.0 mmHg.   6. The inferior vena cava is normal in size with greater than 50%  respiratory variability, suggesting right atrial pressure of 3 mmHg.     Neuro/Psych  Headaches PSYCHIATRIC DISORDERS Anxiety Depression    CVA    GI/Hepatic Neg liver ROS,GERD  ,,  Endo/Other  diabetesHypothyroidism  Class 3 obesity (BMI 43)  Renal/GU Renal diseaseLab Results      Component                Value               Date                      NA                        145                 05/03/2023                CL                       107                 05/03/2023                K                        3.9                 05/03/2023                CO2                      25                  05/03/2023                BUN  60 (H)              05/03/2023                CREATININE               1.48 (H)            05/03/2023                GFRNONAA                 36 (L)              05/03/2023                CALCIUM                  8.7 (L)             05/03/2023                PHOS                     1.9 (L)             04/23/2023                ALBUMIN                  2.8 (L)             04/30/2023                GLUCOSE                  156 (H)             05/03/2023             negative genitourinary   Musculoskeletal  (+) Arthritis ,    Abdominal   Peds  Hematology  (+) Blood dyscrasia (eliquis), anemia Lab Results      Component                Value               Date                      WBC                      10.2                05/03/2023                HGB                      9.7 (L)             05/03/2023                HCT                      31.2 (L)            05/03/2023                MCV                      95.4                05/03/2023  PLT                      233                 05/03/2023              Anesthesia Other Findings   Reproductive/Obstetrics                             Anesthesia Physical Anesthesia Plan  ASA: 3  Anesthesia Plan: MAC   Post-op Pain Management:    Induction: Intravenous  PONV Risk Score and Plan: Propofol infusion and Treatment may vary due to age or medical condition  Airway Management Planned: Natural Airway  Additional Equipment:   Intra-op Plan:   Post-operative Plan:   Informed Consent: I have reviewed the patients History and Physical, chart, labs and discussed the procedure including  the risks, benefits and alternatives for the proposed anesthesia with the patient or authorized representative who has indicated his/her understanding and acceptance.   Patient has DNR.  Discussed DNR with patient and Suspend DNR.   Dental advisory given  Plan Discussed with: CRNA  Anesthesia Plan Comments:        Anesthesia Quick Evaluation

## 2023-05-04 DIAGNOSIS — I5020 Unspecified systolic (congestive) heart failure: Secondary | ICD-10-CM

## 2023-05-04 DIAGNOSIS — K921 Melena: Secondary | ICD-10-CM | POA: Diagnosis not present

## 2023-05-04 DIAGNOSIS — N179 Acute kidney failure, unspecified: Secondary | ICD-10-CM

## 2023-05-04 DIAGNOSIS — I513 Intracardiac thrombosis, not elsewhere classified: Secondary | ICD-10-CM | POA: Diagnosis not present

## 2023-05-04 DIAGNOSIS — N184 Chronic kidney disease, stage 4 (severe): Secondary | ICD-10-CM

## 2023-05-04 LAB — BASIC METABOLIC PANEL
Anion gap: 12 (ref 5–15)
BUN: 63 mg/dL — ABNORMAL HIGH (ref 8–23)
CO2: 27 mmol/L (ref 22–32)
Calcium: 8.7 mg/dL — ABNORMAL LOW (ref 8.9–10.3)
Chloride: 106 mmol/L (ref 98–111)
Creatinine, Ser: 1.88 mg/dL — ABNORMAL HIGH (ref 0.44–1.00)
GFR, Estimated: 27 mL/min — ABNORMAL LOW (ref >60)
Glucose, Bld: 155 mg/dL — ABNORMAL HIGH (ref 70–99)
Potassium: 3.8 mmol/L (ref 3.5–5.1)
Sodium: 145 mmol/L (ref 135–145)

## 2023-05-04 LAB — CBC
HCT: 34.6 % — ABNORMAL LOW (ref 36.0–46.0)
Hemoglobin: 10.6 g/dL — ABNORMAL LOW (ref 12.0–15.0)
MCH: 29.9 pg (ref 26.0–34.0)
MCHC: 30.6 g/dL (ref 30.0–36.0)
MCV: 97.5 fL (ref 80.0–100.0)
Platelets: 231 10*3/uL (ref 150–400)
RBC: 3.55 MIL/uL — ABNORMAL LOW (ref 3.87–5.11)
RDW: 17.3 % — ABNORMAL HIGH (ref 11.5–15.5)
WBC: 10.1 10*3/uL (ref 4.0–10.5)
nRBC: 0 % (ref 0.0–0.2)

## 2023-05-04 LAB — GLUCOSE, CAPILLARY
Glucose-Capillary: 137 mg/dL — ABNORMAL HIGH (ref 70–99)
Glucose-Capillary: 200 mg/dL — ABNORMAL HIGH (ref 70–99)
Glucose-Capillary: 161 mg/dL — ABNORMAL HIGH (ref 70–99)
Glucose-Capillary: 139 mg/dL — ABNORMAL HIGH (ref 70–99)
Glucose-Capillary: 174 mg/dL — ABNORMAL HIGH (ref 70–99)

## 2023-05-04 LAB — MAGNESIUM: Magnesium: 2.1 mg/dL (ref 1.7–2.4)

## 2023-05-04 MED ORDER — POTASSIUM CHLORIDE 20 MEQ PO PACK
20.0000 meq | PACK | Freq: Once | ORAL | Status: DC
  Filled 2023-05-04: qty 1

## 2023-05-04 MED ORDER — POTASSIUM CHLORIDE 20 MEQ PO PACK: 40.0000 meq | PACK | Freq: Once | ORAL | Status: DC

## 2023-05-04 NOTE — Assessment & Plan Note (Addendum)
No shortness of breath overnight or on BiPAP. -DuoNebs Q6h PRN -Nightly BiPAP -Continue Dulera 2 puffs twice daily -Continue mucinex for cough  -Titrate O2 to 88-92%, on home 4 L w/ exertion

## 2023-05-04 NOTE — Plan of Care (Signed)
FMTS Interim Progress Note  Discussed BiPAP use with patient. She is amenable to giving it another try tonight. Discussed with patient's nursing team - will try to provide barrier/cushion to improve comfort while using BiPAP  Vonna Drafts, MD 05/04/2023, 2:52 AM PGY-2, [facility] Family Medicine Service pager [phone]

## 2023-05-04 NOTE — Assessment & Plan Note (Addendum)
Suspect acute fracture d/t osteoporosis in setting of physical therapy. -Neurosurgery consulted, appreciate recs -Calcitonin nasal spray daily -Continue tylenol 650 mg Q5h -PT/OT eval and treat

## 2023-05-04 NOTE — Assessment & Plan Note (Addendum)
S/p Lasix 60 mg IV yesterday, creatinine now bumped 1.48>1.88.  Will hold diuresis today to allow for renovascular equilibrium. -Hold IV Lasix pending creatinine improvement -Cardiology following, appreciate recs -Strict I&Os, daily weights -Continue carvedilol 3.125 mg BID -AM BMP

## 2023-05-04 NOTE — Consult Note (Signed)
Consultation Note Date: 05/04/2023   Patient Name: Tonya Glass  DOB: 11-05-1945  MRN: [id number]  Age / Sex: 77 y.o., female  PCP: No primary care provider on file. Referring Physician: Westley Chandler, MD  Reason for Consultation: Establishing goals of care  HPI/Patient Profile: 77 y.o. female  admitted on 04/30/2023   Clinical Assessment and Goals of Care: 77 year old lady who is a resident of long-term care facility [facility] locally.  She is admitted to family medicine teaching service.  Palliative care consult for goals of care discussions has been requested. Patient has a past medical history of heart failure with preserved ejection fraction, dyslipidemia, aortic stenosis status post valve replacement, history of sick sinus syndrome hypothyroidism stage IV CKD hypertension diabetes.  She also has COPD and is on 4 L supplemental oxygen via nasal cannula and follows with Dr. Celine Mans in the outpatient setting for pulmonary care. Patient admitted with progressive weakness lower back pain.  Patient admitted for upper GI bleed.  Patient underwent EGD on 12-20 found to have tortuous esophagus, 4 cm hiatal hernia, 2 angioectasias 1 of which was given APC treatment.  Earlier in the hospitalization, she was also evaluated by Dr. Isaiah Serge from pulmonary service for CT scan showing multifocal pneumonia in the setting of underlying COPD. Patient remains under the care of of family medicine teaching service.  Her hemoglobin is being serially monitored, she is on long-term anticoagulation which is to be started on 12-22 per GI recommendations.  She is to also continue PPI twice daily for 4 weeks.  Also placed on Carafate.  Patient placed on antibiotics for multifocal pneumonia.  Patient also under evaluation by speech therapy and modified barium swallow to rule out aspiration is being considered. In light of the  patient's several underlying chronic illnesses and current presentation, palliative service consult was requested for ongoing goals of care discussions. Palliative consult request received Chart reviewed Patient seen and examined Call placed and also discussed with daughter Tonya Glass. Palliative medicine is specialized medical care for people living with serious illness. It focuses on providing relief from the symptoms and stress of a serious illness. The goal is to improve quality of life for both the patient and the family. Goals of care: Broad aims of medical therapy in relation to the patient's values and preferences. Our aim is to provide medical care aimed at enabling patients to achieve the goals that matter most to them, given the circumstances of their particular medical situation and their constraints.   Patient is awake alert resting in bed.  She states that she feels "so-so".  She denies any acute complaints or concerns.  I discussed with her about scope of current hospitalization.  Discussed with her about recommendations from pulmonary medicine, discussed with her to the best of my ability about EGD findings.  Goals wishes and values attempted to be explored.  Call placed and also discussed with daughter Tonya Glass.  She is aware of EGD results.  Recommend addition of palliative services as an extra  layer of support at [facility] long-term care.  Patient has not undergone a modified barium swallow study in the past.  Monitor oral intake, monitor functional status.  Patient will be most benefited by ongoing longitudinal palliative support in her long-term care setting.  NEXT OF KIN Daughter Tonya Glass at [phone].  SUMMARY OF RECOMMENDATIONS   Agree with DO NOT RESUSCITATE Continue current mode of care Recommend addition of palliative services and extra layer of support and for continuing goals of care discussions with patient and [facility] at  [facility].  Will recommend TOC consult to set up palliative services at patient's long-term care facility. Thank you for the consult  Code Status/Advance Care Planning: DNR   Symptom Management:     Palliative Prophylaxis:  Delirium Protocol  Additional Recommendations (Limitations, Scope, Preferences): Full Scope Treatment  Psycho-social/Spiritual:  Desire for further Chaplaincy support:yes Additional Recommendations: Caregiving  Support/Resources  Prognosis:  Unable to determine  Discharge Planning:  recommend addition of palliative services at [facility] term care.       Primary Diagnoses: Present on Admission:  Symptomatic anemia  COPD (chronic obstructive pulmonary disease) (HCC)  Concern for multidrug resistant pneumonia  Acute kidney injury 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)   I have reviewed the medical record, interviewed the patient and family, and examined the patient. The following aspects are pertinent.  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   Migraines    "none in years til 01/2013-02/2013; none since"  (07/24/2017)   Orthopnoea    "just since open heart surgery" (02/25/2013)   Pacemaker    [employer]    Pneumonia 07/23/2017   Stroke (HCC) 2001   denies residual on 02/25/2013   Symptomatic hypotension 10/10/2016   Syncope and collapse    "prior to open heart surgery" (02/25/2013)   Type II diabetes mellitus (HCC)    Social History    Socioeconomic History   Marital status: Widowed    Spouse name: Not on file   Number of children: 2   Years of education: dental sch   Highest education level: Not on file  Occupational History   Occupation: retired  Tobacco Use   Smoking status: Former    Current packs/day: 0.00    Average packs/day: 2.0 packs/day for 40.0 years (80.0 ttl pk-yrs)    Types: Cigarettes    Start date: 05/29/1959    Quit date: 05/29/1999    Years since quitting: 23.9   Smokeless tobacco: Never  Vaping Use   Vaping status: Never Used  Substance and Sexual Activity   Alcohol use: No   Drug use: No   Sexual activity: Yes  Other Topics Concern   Not on file  Social History Narrative   Not on file   Social Drivers of Health   Financial Resource Strain: Not on file  Food Insecurity: No Food Insecurity (05/01/2023)   Hunger Vital Sign  Worried About Programme researcher, broadcasting/film/video in the Last Year: Never true    Ran Out of Food in the Last Year: Never true  Transportation Needs: No Transportation Needs (05/01/2023)   PRAPARE - Administrator, Civil Service (Medical): No    Lack of Transportation (Non-Medical): No  Physical Activity: Not on file  Stress: Not on file  Social Connections: Unknown (09/25/2021)   [facility]   Social Network    Social Network: Not on file   Family History  Problem Relation Age of Onset   Lung cancer Mother    Heart Problems Father    Hypertension Brother    Colon polyps Brother    Skin cancer Brother    Asthma Brother    Colon polyps Daughter    Diabetes Maternal Aunt    Breast cancer Maternal Aunt        mets to liver   Diabetes Paternal Aunt    Scheduled Meds:  acetaminophen  650 mg Oral Q6H   atorvastatin  40 mg Oral Daily   carvedilol  3.125 mg Oral BID   cholecalciferol  1,000 Units Oral Daily   FLUoxetine  40 mg Oral Daily   gabapentin  600 mg Oral QHS   guaiFENesin  600 mg Oral BID   hydrALAZINE  25 mg Oral  Q8H   insulin aspart  0-9 Units Subcutaneous TID AC   ipratropium  2 spray Each Nare TID   isosorbide mononitrate  15 mg Oral Daily   levothyroxine  75 mcg Oral Daily   mometasone-formoterol  2 puff Inhalation BID   pantoprazole  40 mg Oral BID AC   potassium chloride  20 mEq Oral Once   sodium chloride flush  3-10 mL Intravenous Q12H   sucralfate  1 g Oral TID WC & HS   traZODone  25 mg Oral QHS   Continuous Infusions:  cefTRIAXone (ROCEPHIN)  IV 2 g (05/03/23 2226)   PRN Meds:.ipratropium-albuterol, mouth rinse, sodium chloride flush Medications Prior to Admission:  Prior to Admission medications   Medication Sig Start Date End Date Taking? Authorizing Provider  acetaminophen (TYLENOL) 325 MG tablet Take 650 mg by mouth every 8 (eight) hours as needed for mild pain (pain score 1-3).   Yes [provider]  alendronate (FOSAMAX) 70 MG tablet Take 70 mg by mouth every Sunday. 07/13/17  Yes [provider]  Amino Acids-Protein Hydrolys (FEEDING SUPPLEMENT, PRO-STAT SUGAR FREE 64,) LIQD Take 30 mLs by mouth 2 (two) times daily.   Yes [provider]  apixaban (ELIQUIS) 5 MG TABS tablet Take 5 mg by mouth 2 (two) times daily.   Yes [provider]  atorvastatin (LIPITOR) 40 MG tablet Take 40 mg by mouth daily.   Yes [provider]  budesonide-formoterol (SYMBICORT) 160-4.5 MCG/ACT inhaler Inhale 2 puffs into the lungs 2 (two) times daily.   Yes [provider]  carvedilol (COREG) 3.125 MG tablet Take 1 tablet (3.125 mg total) by mouth 2 (two) times daily. 06/23/22  Yes Jonita Albee, PA-C  Cholecalciferol 50 MCG (2000 UT) TABS Take 1 tablet by mouth daily.   Yes [provider]  Emollient (AQUAPHOR ADV THERAPY HEALING) 41 % OINT Apply 1 Application topically See admin instructions. Apply 1 application topically every shift, apply to sacrum wound.   Yes [provider]  Ferrous Sulfate (IRON) 325 (65 Fe) MG TABS Take  325 mg by mouth daily. 08/13/17  Yes [provider]  FLUoxetine (PROZAC) 40 MG capsule Take 40 mg by mouth daily.    Yes [provider]  gabapentin (NEURONTIN) 600 MG tablet Take 600 mg by mouth at bedtime. 04/24/23  Yes [provider]  guaiFENesin (MUCINEX) 600 MG 12 hr tablet Take 600 mg by mouth 2 (two) times daily.   Yes [provider]  HUMALOG KWIKPEN 100 UNIT/ML KwikPen Inject into the skin 2 (two) times daily. Sliding scale 03/11/23  Yes [provider]  hydrALAZINE (APRESOLINE) 25 MG tablet Take 1 tablet (25 mg total) by mouth every 8 (eight) hours. 04/23/23  Yes Elberta Fortis, MD  ipratropium (ATROVENT) 0.06 % nasal spray Place 2 sprays into both nostrils 3 (three) times daily. 12/14/21  Yes Charlott Holler, MD  isosorbide mononitrate (IMDUR) 30 MG 24 hr tablet Take 0.5 tablets (15 mg total) by mouth daily. 04/23/23  Yes Elberta Fortis, MD  LANTUS SOLOSTAR 100 UNIT/ML Solostar Pen Inject 6 Units into the skin at bedtime. 04/23/23  Yes Elberta Fortis, MD  levocetirizine (XYZAL) 5 MG tablet Take 5 mg by mouth at bedtime. 09/05/21  Yes [provider]  levothyroxine (SYNTHROID) 75 MCG tablet Take 75 mcg by mouth daily. 03/22/23  Yes [provider]  magnesium oxide (MAG-OX) 400 (240 Mg) MG tablet Take 400 mg by mouth daily.   Yes [provider]  [facility] powder Apply 1 Application topically 2 (two) times daily. 04/24/23  Yes [provider]  pantoprazole (PROTONIX) 40 MG tablet Take 40 mg by mouth 2 (two) times daily. 03/26/23  Yes [provider]  PROAIR RESPICLICK 108 (90 Base) MCG/ACT AEPB Inhale 2 puffs into the lungs 4 (four) times daily as needed. 02/19/23  Yes [provider]  sodium chloride (OCEAN) 0.65 % SOLN nasal spray Place 2 sprays into both nostrils every 6 (six) hours as needed for congestion.   Yes [provider]  sucralfate (CARAFATE) 1 g tablet Take 1 g by mouth 4  (four) times daily -  with meals and at bedtime.   Yes [provider]  torsemide (DEMADEX) 20 MG tablet Take 60 mg by mouth 2 (two) times daily. 04/30/23  Yes [provider]  traZODone (DESYREL) 50 MG tablet Take 0.5 tablets (25 mg total) by mouth at bedtime. 04/23/23  Yes Elberta Fortis, MD  Benzethonium Chloride 0.13 % LIQD Apply 1 Application topically 2 (two) times a week. On Tuesdays and Fridays    [provider]  nystatin ointment (MYCOSTATIN) Apply 1 application  topically daily as needed (rash). 100,000- units/gram    [provider]   No Known Allergies Review of Systems Some mild generalized weakness Physical Exam Elderly appearing lady resting in chair Appears with mild generalized weakness Appears in no acute distress Awakens and answers questions appropriately Regular work of breathing Has at least 1+ peripheral edema bilaterally Has chronic lower extremity venous stasis changes Skin is warm and dry otherwise  Vital Signs: BP (!) 156/42   Pulse 70   Temp 97.7 F (36.5 C) (Oral)   Resp 18   Ht 5' (1.524 m)   Wt 100.3 kg   SpO2 98%   BMI 43.18 kg/m  Pain Scale: 0-10   Pain Score: 0-No pain   SpO2: SpO2: 98 % O2 Device:SpO2: 98 % O2 Flow Rate: .O2 Flow Rate (L/min): 2 L/min  IO: Intake/output summary:  Intake/Output Summary (Last 24 hours) at 05/04/2023 1129 Last data filed at 05/03/2023 1800 Gross per 24 hour  Intake 220 ml  Output --  Net 220 ml    LBM: Last BM Date : 05/03/23 Baseline Weight: Weight: 96 kg Most recent weight: Weight: 100.3 kg     Palliative Assessment/Data:   Palliative performance scale 50%.  Time In: 10 Time Out: 11 Time Total: 60 Greater than 50%  of this time was spent counseling and coordinating care related to the above assessment and plan.  Signed by: Rosalin Hawking, MD   Please contact Palliative Medicine Team phone at [phone] for questions and concerns.  For individual  provider: See Loretha Stapler

## 2023-05-04 NOTE — Anesthesia Postprocedure Evaluation (Signed)
Anesthesia Post Note  Patient: Tonya Glass  Procedure(s) Performed: ESOPHAGOGASTRODUODENOSCOPY (EGD) WITH PROPOFOL HOT HEMOSTASIS (ARGON PLASMA COAGULATION/BICAP)     Patient location during evaluation: Endoscopy Anesthesia Type: MAC Level of consciousness: awake and alert Pain management: pain level controlled Vital Signs Assessment: post-procedure vital signs reviewed and stable Respiratory status: spontaneous breathing, nonlabored ventilation, respiratory function stable and patient connected to nasal cannula oxygen Cardiovascular status: blood pressure returned to baseline and stable Postop Assessment: no apparent nausea or vomiting Anesthetic complications: no  No notable events documented.  Last Vitals:  Vitals:   05/04/23 0332 05/04/23 0823  BP: (!) 144/34 (!) 156/42  Pulse: 70 70  Resp: 18   Temp: 36.6 C 36.5 C  SpO2: 96% 98%    Last Pain:  Vitals:   05/04/23 0823  TempSrc: Oral  PainSc:    Pain Goal:                   Tonya Glass

## 2023-05-04 NOTE — Assessment & Plan Note (Addendum)
S/p endoscopy with cauterization of angio ectasia.  Hemoglobin improved this morning.   -GI following, appreciate recs -Restart Eliquis at 48 hours on 12/22 per GI recs -Continue pantoprazole 40 mg BID (x4 weeks from 12/18), carafate 1 g QID -Transfusion threshold Hgb<8 -Daily CBC

## 2023-05-04 NOTE — Progress Notes (Signed)
Speech Language Pathology Treatment: Dysphagia  Patient Details Name: Tonya Glass MRN: [id number] DOB: [birth year] Today's Date: 05/04/2023 Time: 6433-2951 SLP Time Calculation (min) (ACUTE ONLY): 28 min  Assessment / Plan / Recommendation Clinical Impression  Pt sitting in recliner. Breakfast tray arrived and she was assisted with placing dentures.  Minimal appetite this am, had only one bite of toast and drank several sips of water.  Continues to present with mild delayed cough on <10% of sips of water.  Plan remains to move forward with MBS, however there have been scheduling conflicts.  MBS may offer more information with regard to overall respiratory status. It is not urgent that the study is completed during this admission - it can be done as an OP. D/W Dr. Marsh Dolly.   For now, recommend that she continue current diet (regular solids/thin liquids). SLP will follow.    HPI HPI: Patient is a 77 y.o. female, long term resident at [facility]. PMH: GERD, SOB, HFpEF, HLD, Aortic stenosis s/p valve replacement, Sick sinus syndrome, Hypothyroidism, HTN, CKD, COPD on 4L oxygen at baseline, T2DM. She was recently admitted at [facility] 11/27-12/11/24 with CHF exacerbation. She presented back to the hospital on 05/01/23 with back pain and feeling weak starting two days prior to admission. She did endorse one episode of emesis and productive cough of green phlegm for past several days. In ED, she was tachypneic, on home oxygen level of 4L. CT chest abd pelvis with small abdominal wall intramuscular hematoma, multifocal pneumonia and small pleural effusion. GI consulted for GI bleed, MD ordered SLP swallow evaluation to determine need for modified barium swallow study to r/o aspiration. EGD 12/20 showed mildly tortuous lower third of esophagus.      SLP Plan  Continue with current plan of care      Recommendations for follow up therapy are one component of a multi-disciplinary discharge planning  process, led by the attending physician.  Recommendations may be updated based on patient status, additional functional criteria and insurance authorization.    Recommendations  Diet recommendations: Regular;Thin liquid Liquids provided via: Cup;Straw Medication Administration: Whole meds with liquid Supervision: Patient able to self feed Compensations: Slow rate;Small sips/bites                  Oral care BID           Continue with current plan of care    Geno Sydnor L. Samson Frederic, MA CCC/SLP Clinical Specialist - Acute Care SLP Acute Rehabilitation Services Office number [phone]  [name]  05/04/2023, 8:57 AM

## 2023-05-04 NOTE — Assessment & Plan Note (Deleted)
 Will hold Eliquis 5 mg BID given suspected UGIB, restart per GI recs. -Follow up TTE to evaluate status of LV apical thrombus previously noted on TTE 11/28 -Consult Cardiology in AM -AM BMP; goal K>4, mag>2

## 2023-05-04 NOTE — Plan of Care (Signed)
Conts on 2L [place], neb treatment and insulin coverage.   Problem: Education: Goal: Ability to describe self-care measures that may prevent or decrease complications (Diabetes Survival Skills Education) will improve Outcome: Not Met (add Reason) Goal: Individualized Educational Video(s) Outcome: Not Met (add Reason)   Problem: Coping: Goal: Ability to adjust to condition or change in health will improve Outcome: Not Met (add Reason)   Problem: Fluid Volume: Goal: Ability to maintain a balanced intake and output will improve Outcome: Not Met (add Reason)   Problem: Health Behavior/Discharge Planning: Goal: Ability to identify and utilize available resources and services will improve Outcome: Not Met (add Reason) Goal: Ability to manage health-related needs will improve Outcome: Not Met (add Reason)   Problem: Metabolic: Goal: Ability to maintain appropriate glucose levels will improve Outcome: Not Met (add Reason)   Problem: Nutritional: Goal: Maintenance of adequate nutrition will improve Outcome: Not Met (add Reason) Goal: Progress toward achieving an optimal weight will improve Outcome: Not Met (add Reason)   Problem: Skin Integrity: Goal: Risk for impaired skin integrity will decrease Outcome: Not Met (add Reason)   Problem: Tissue Perfusion: Goal: Adequacy of tissue perfusion will improve Outcome: Not Met (add Reason)   Problem: Education: Goal: Knowledge of General Education information will improve Description: Including pain rating scale, medication(s)/side effects and non-pharmacologic comfort measures Outcome: Not Met (add Reason)   Problem: Health Behavior/Discharge Planning: Goal: Ability to manage health-related needs will improve Outcome: Not Met (add Reason)   Problem: Clinical Measurements: Goal: Ability to maintain clinical measurements within normal limits will improve Outcome: Not Met (add Reason) Goal: Will remain free from infection Outcome: Not  Met (add Reason) Goal: Diagnostic test results will improve Outcome: Not Met (add Reason) Goal: Respiratory complications will improve Outcome: Not Met (add Reason) Goal: Cardiovascular complication will be avoided Outcome: Not Met (add Reason)   Problem: Activity: Goal: Risk for activity intolerance will decrease Outcome: Not Met (add Reason)   Problem: Nutrition: Goal: Adequate nutrition will be maintained Outcome: Not Met (add Reason)   Problem: Coping: Goal: Level of anxiety will decrease Outcome: Not Met (add Reason)   Problem: Elimination: Goal: Will not experience complications related to bowel motility Outcome: Not Met (add Reason) Goal: Will not experience complications related to urinary retention Outcome: Not Met (add Reason)   Problem: Pain Management: Goal: General experience of comfort will improve Outcome: Not Met (add Reason)   Problem: Safety: Goal: Ability to remain free from injury will improve Outcome: Not Met (add Reason)   Problem: Skin Integrity: Goal: Risk for impaired skin integrity will decrease Outcome: Not Met (add Reason)

## 2023-05-04 NOTE — Progress Notes (Signed)
Patient Name: Tonya Glass Date of Encounter: 05/04/2023 [facility] Cardiologist: Chrystie Nose, MD   Interval Summary  .    Incomplete I/Os.  Worsening renal function (1.48>1.88). Denies any chest pain or dyspnea  Vital Signs .    Vitals:   05/03/23 2320 05/04/23 0332 05/04/23 0823 05/04/23 1211  BP: (!) 159/129 (!) 144/34 (!) 156/42 (!) 159/59  Pulse: 77 70 70 64  Resp: 18 18    Temp: 98 F (36.7 C) 97.8 F (36.6 C) 97.7 F (36.5 C) 98 F (36.7 C)  TempSrc: Oral Oral Oral   SpO2: 97% 96% 98% 99%  Weight:      Height:        Intake/Output Summary (Last 24 hours) at 05/04/2023 1328 Last data filed at 05/03/2023 1800 Gross per 24 hour  Intake 120 ml  Output --  Net 120 ml       05/02/2023    5:29 AM 04/30/2023    7:46 PM 04/24/2023    7:23 AM  Last 3 Weights  Weight (lbs) 221 lb 1.9 oz 211 lb 10.3 oz 211 lb 10.3 oz  Weight (kg) 100.3 kg 96 kg 96 kg      Telemetry/ECG    APVP - Personally Reviewed  Physical Exam .    VS:  BP (!) 159/59   Pulse 64   Temp 98 F (36.7 C)   Resp 18   Ht 5' (1.524 m)   Wt 100.3 kg   SpO2 99%   BMI 43.18 kg/m  , BMI Body mass index is 43.18 kg/m. GENERAL:  Chronically ill-appearing.  No acute distress.  NECK:  No jugular venous distention LUNGS:  Diminished breath sounds bilaterally.  HEART:  RRR.  , II/VI systolic murmur  ABD:  nondistended EXT:  trace bilateral LE edema SKIN:  Pallor.  Stasis dermatitis.   NEURO:  alert and oriented PSYCH:  normal affect   Assessment & Plan .     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     # HFrEF:  # RV failure: # Moderate pleural effusions: LVEF 35-40%.  Worsening renal function with diuresis yesterday, would hold lasix for  today. Continue carvedilol, hydralazine and Imdur.    # Anemia:  Imaging concerning for small intramuscular abdominal wall hematoma.  Unclear how much this is contributing to her anemia.  FOBT +.  EGD 12/20 showed bleeding angioectasias in the stomach, treated with APC.  Holding home Eliquis.  H/H stable today after transfusion.    # Elevated troponin: # Chest pain:  Challenging to characterize her chest pain.  HS troponin is mildly elevated and flat.  More consistent with demand ischemia and she isn't a cath candidate at this time given her GI bleed and CKD IV.      # Hypertension: Continue carvedilol, hydralazine and Imdur.    # Atrial flutter:  Currently in sinus rhythm.  Eliquis on hold.  Continue carvedilol.  # OSA/OHS: Continue to recommend outpatient sleep evaluation.   # LV apical thrombus:  Noted on prior echo.  Eliquis on hold for now.    For questions or updates, please contact [facility] Please consult [email or web address] for contact info under        Signed, Little Ishikawa, MD

## 2023-05-04 NOTE — Progress Notes (Addendum)
Daily Progress Note Intern Pager: [phone]  Patient name: Tonya Glass Medical record number: [id number] Date of birth: 11/21/1945 Age: 77 y.o. Gender: female  Primary Care Provider: No primary care provider on file. Consultants: GI, Cardiology, Pulmonology, Wound care, Palliative Care Code Status: DNR   Pt Overview and Major Events to Date:  12/18: Admitted, s/p 2 units PRBCs, azithromycin and CTX continued from ED 12/20: EGD showing 4 cm hiatal hernia with bleeding 2 mm angio ectasia; successful cauterization   Assessment and Plan: Tonya Glass is a 77 y.o. female with a pertinent PMH of HFpEF, HLD, aortic stenosis s/p valve replacement, sick sinus syndrome, hypothyroidism, HTN, CKD stage IV, T2DM, and COPD on home oxygen 4L with exertion who presented with progressive weakness and lower back pain, admitted for UGIB, now s/p EGD with cautery and stabilized.  Patient still undergoing treatment for possible drug resistant pneumonia.  Will continue following specialist recs for treatment course.  Now with creatinine spike in setting of diuresis, will hold Lasix today.  Patient will need to be restarted on Eliquis to discharge safely, which can happen 12/22 per GI recs.  Palliative Care to see patient for goals of care conversation given her complex and severe medical comorbidity burden. Assessment & Plan Symptomatic anemia S/p endoscopy with cauterization of angio ectasia.  Hemoglobin improved this morning.   -GI following, appreciate recs -Restart Eliquis at 48 hours on 12/22 per GI recs -Continue pantoprazole 40 mg BID (x4 weeks from 12/18), carafate 1 g QID -Transfusion threshold Hgb<8 -Daily CBC Concern for multidrug resistant pneumonia Pulmonology following, who believe this may be multifocal PNA but are completing work up to rule out autoimmune process.  MRSA negative. -Pulmonology following, appreciate recs -SLP eval with modified barium swallow to rule out  aspiration -Ceftriaxone 2 g (12/17-12/21) and azithromycin (12/18 -12/20)  HFrEF (heart failure with reduced ejection fraction) (HCC) S/p Lasix 60 mg IV yesterday, creatinine now bumped 1.48>1.88.  Will hold diuresis today to allow for renovascular equilibrium. -Hold IV Lasix pending creatinine improvement -Cardiology following, appreciate recs -Strict I&Os, daily weights -Continue carvedilol 3.125 mg BID -AM BMP Acute kidney injury 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) Creatinine now bumped in setting of IV diuresis.  Will continue to monitor while she is admitted. Patient is likely approaching baseline. -AM BMP COPD (chronic obstructive pulmonary disease) (HCC) No shortness of breath overnight or on BiPAP. -DuoNebs Q6h PRN -Nightly BiPAP -Continue Dulera 2 puffs twice daily -Continue mucinex for cough  -Titrate O2 to 88-92%, on home 4 L w/ exertion T2DM (type 2 diabetes mellitus) (HCC) A1c 6.0, discharged on 6 units LAI daily and SSI. -CBGs Q6h while NPO -No LAI while NPO, dose as needed based on CBGs with goal 140-180 -Sensitive SSI -Still holding Farxiga from last admission as noted above Lumbar compression fracture (HCC) Suspect acute fracture d/t osteoporosis in setting of physical therapy. -Neurosurgery consulted, appreciate recs -Calcitonin nasal spray daily -Continue tylenol 650 mg Q5h -PT/OT eval and treat Sacral wound Nursing noted sacral skin breakdown. -WOC consulted, nursing to follow wound care recs  Chronic and Stable Problems: A-fib: Continue to hold Eliquis in setting of GI bleed  Type 2 diabetes: Continue SSI  hypothyroidism: Continue levothyroxine 75 mcg daily Depression: Continue fluoxetine 40 mg daily, trazodone 25 mg nightly Peripheral neuropathy: Continue gabapentin 600 mg nightly Osteoporosis: Continue cholecalciferol 2000 IU daily Hyperlipidemia: Continue home atorvastatin 40 mg daily HTN: Continue Imdur 50 mg daily, hydralazine 25  mg every  8 hours   FEN/GI: Heart healthy/carb modified diet PPx: SCDs in setting of acute hemorrhage, continuing to hold per GI Dispo: SNF versus home with palliative care pending clinical improvement  Subjective:  Received BiPAP overnight.  Unable to speak while on BiPAP, but nods in agreement that she is doing okay.  Objective: Temp:  [97 F (36.1 C)-98.5 F (36.9 C)] 98 F (36.7 C) (12/20 2320) Pulse Rate:  [63-80] 77 (12/20 2320) Resp:  [16-21] 18 (12/20 2320) BP: (130-173)/(43-129) 159/129 (12/20 2320) SpO2:  [90 %-97 %] 97 % (12/20 2320)  Physical Exam: General: Elderly female sitting up in chair next to bed, on BiPAP, no acute distress.  Alert and oriented x 4. Cardiovascular: Regular rate and rhythm. Normal S1/S2.  3/6 systolic prosthetic valve murmur.  No rubs or gallops appreciated. 2+ radial pulses. Pulmonary: No increased WOB, no accessory muscle usage on BiPAP.  Referred upper airway sounds, no significant wheezing. Abdominal: No TTP, nondistended. Skin: Warm and dry. Extremities: 1+ pitting peripheral edema bilaterally.  Chronic lower extremity venous stasis changes overlying bilateral shins.  Capillary refill <2 seconds.  Laboratory: Most recent CBC Lab Results  Component Value Date   WBC 10.2 05/03/2023   HGB 9.7 (L) 05/03/2023   HCT 31.2 (L) 05/03/2023   MCV 95.4 05/03/2023   PLT 233 05/03/2023   Most recent BMP    Latest Ref Rng & Units 05/03/2023    3:32 AM  BMP  Glucose 70 - 99 mg/dL 657   BUN 8 - 23 mg/dL 60   Creatinine 8.46 - 1.00 mg/dL 9.62   Sodium 952 - 841 mmol/L 145   Potassium 3.5 - 5.1 mmol/L 3.9   Chloride 98 - 111 mmol/L 107   CO2 22 - 32 mmol/L 25   Calcium 8.9 - 10.3 mg/dL 8.7     Other pertinent labs: -None  New Imaging/Diagnostic Tests: -None  Phillipe Clemon, MD 05/04/2023, 3:29 AM  PGY-1, [facility] FPTS Intern pager: [phone], text pages welcome Secure chat group [facility]  Teaching Service

## 2023-05-04 NOTE — Assessment & Plan Note (Signed)
 A1c 6.0, discharged on 6 units LAI daily and SSI. -CBGs Q6h while NPO -No LAI while NPO, dose as needed based on CBGs with goal 140-180 -Sensitive SSI -Still holding Farxiga from last admission as noted above

## 2023-05-04 NOTE — Plan of Care (Signed)
  Problem: Coping: Goal: Ability to adjust to condition or change in health will improve Outcome: Progressing   Problem: Nutritional: Goal: Maintenance of adequate nutrition will improve Outcome: Progressing   Problem: Skin Integrity: Goal: Risk for impaired skin integrity will decrease Outcome: Progressing   Problem: Activity: Goal: Risk for activity intolerance will decrease Outcome: Progressing   Problem: Pain Management: Goal: General experience of comfort will improve Outcome: Progressing   Problem: Safety: Goal: Ability to remain free from injury will improve Outcome: Progressing   Problem: Skin Integrity: Goal: Risk for impaired skin integrity will decrease Outcome: Progressing

## 2023-05-04 NOTE — Assessment & Plan Note (Addendum)
Pulmonology following, who believe this may be multifocal PNA but are completing work up to rule out autoimmune process.  MRSA negative. -Pulmonology following, appreciate recs -SLP eval with modified barium swallow to rule out aspiration -Ceftriaxone 2 g (12/17-12/21) and azithromycin (12/18 -12/20)

## 2023-05-04 NOTE — Assessment & Plan Note (Deleted)
 QTcB 510 and receiving pantoprazole 40 mg IV BID.  However, not on any other QT prolonging meds for pantoprazole to interact with currently and benefits outweigh risks in setting of suspected UGIB. -f/u repeat EKG -Avoid QT prolonging meds

## 2023-05-04 NOTE — Assessment & Plan Note (Addendum)
Nursing noted sacral skin breakdown. -WOC consulted, nursing to follow wound care recs

## 2023-05-04 NOTE — Assessment & Plan Note (Addendum)
Creatinine now bumped in setting of IV diuresis.  Will continue to monitor while she is admitted. Patient is likely approaching baseline. -AM BMP

## 2023-05-05 ENCOUNTER — Telehealth: Payer: Self-pay | Admitting: Internal Medicine

## 2023-05-05 DIAGNOSIS — I5043 Acute on chronic combined systolic (congestive) and diastolic (congestive) heart failure: Secondary | ICD-10-CM

## 2023-05-05 DIAGNOSIS — K921 Melena: Secondary | ICD-10-CM | POA: Diagnosis not present

## 2023-05-05 DIAGNOSIS — I513 Intracardiac thrombosis, not elsewhere classified: Secondary | ICD-10-CM | POA: Diagnosis not present

## 2023-05-05 LAB — CBC
HCT: 36.6 % (ref 36.0–46.0)
HCT: 34.8 % — ABNORMAL LOW (ref 36.0–46.0)
Hemoglobin: 11.2 g/dL — ABNORMAL LOW (ref 12.0–15.0)
Hemoglobin: 10.8 g/dL — ABNORMAL LOW (ref 12.0–15.0)
MCH: 29.6 pg (ref 26.0–34.0)
MCH: 29.8 pg (ref 26.0–34.0)
MCHC: 30.6 g/dL (ref 30.0–36.0)
MCHC: 31 g/dL (ref 30.0–36.0)
MCV: 96.8 fL (ref 80.0–100.0)
MCV: 95.9 fL (ref 80.0–100.0)
Platelets: 247 10*3/uL (ref 150–400)
Platelets: 251 10*3/uL (ref 150–400)
RBC: 3.78 MIL/uL — ABNORMAL LOW (ref 3.87–5.11)
RBC: 3.63 MIL/uL — ABNORMAL LOW (ref 3.87–5.11)
RDW: 17 % — ABNORMAL HIGH (ref 11.5–15.5)
RDW: 16.5 % — ABNORMAL HIGH (ref 11.5–15.5)
WBC: 10.2 10*3/uL (ref 4.0–10.5)
WBC: 10.2 10*3/uL (ref 4.0–10.5)
nRBC: 0 % (ref 0.0–0.2)
nRBC: 0 % (ref 0.0–0.2)

## 2023-05-05 LAB — BASIC METABOLIC PANEL
Anion gap: 12 (ref 5–15)
Anion gap: 15 (ref 5–15)
BUN: 66 mg/dL — ABNORMAL HIGH (ref 8–23)
BUN: 67 mg/dL — ABNORMAL HIGH (ref 8–23)
CO2: 24 mmol/L (ref 22–32)
CO2: 21 mmol/L — ABNORMAL LOW (ref 22–32)
Calcium: 8.7 mg/dL — ABNORMAL LOW (ref 8.9–10.3)
Calcium: 8.8 mg/dL — ABNORMAL LOW (ref 8.9–10.3)
Chloride: 103 mmol/L (ref 98–111)
Chloride: 100 mmol/L (ref 98–111)
Creatinine, Ser: 1.79 mg/dL — ABNORMAL HIGH (ref 0.44–1.00)
Creatinine, Ser: 1.73 mg/dL — ABNORMAL HIGH (ref 0.44–1.00)
GFR, Estimated: 29 mL/min — ABNORMAL LOW (ref >60)
GFR, Estimated: 30 mL/min — ABNORMAL LOW (ref >60)
Glucose, Bld: 170 mg/dL — ABNORMAL HIGH (ref 70–99)
Glucose, Bld: 199 mg/dL — ABNORMAL HIGH (ref 70–99)
Potassium: 3.4 mmol/L — ABNORMAL LOW (ref 3.5–5.1)
Potassium: 3.6 mmol/L (ref 3.5–5.1)
Sodium: 139 mmol/L (ref 135–145)
Sodium: 136 mmol/L (ref 135–145)

## 2023-05-05 LAB — MAGNESIUM: Magnesium: 2.2 mg/dL (ref 1.7–2.4)

## 2023-05-05 LAB — GLUCOSE, CAPILLARY
Glucose-Capillary: 163 mg/dL — ABNORMAL HIGH (ref 70–99)
Glucose-Capillary: 232 mg/dL — ABNORMAL HIGH (ref 70–99)

## 2023-05-05 MED ORDER — POTASSIUM CHLORIDE CRYS ER 20 MEQ PO TBCR
40.0000 meq | EXTENDED_RELEASE_TABLET | Freq: Two times a day (BID) | ORAL | Status: DC
  Administered 2023-05-05: 40 meq via ORAL
  Filled 2023-05-05: qty 2

## 2023-05-05 MED ORDER — APIXABAN 5 MG PO TABS
5.0000 mg | ORAL_TABLET | Freq: Two times a day (BID) | ORAL | Status: DC
  Administered 2023-05-05: 5 mg via ORAL
  Filled 2023-05-05: qty 1

## 2023-05-05 MED ORDER — PANTOPRAZOLE SODIUM 40 MG PO TBEC: 40.0000 mg | DELAYED_RELEASE_TABLET | Freq: Two times a day (BID) | ORAL | Status: DC

## 2023-05-05 MED ORDER — TORSEMIDE 20 MG PO TABS
60.0000 mg | ORAL_TABLET | Freq: Two times a day (BID) | ORAL | Status: DC
  Administered 2023-05-05: 60 mg via ORAL
  Filled 2023-05-05: qty 3

## 2023-05-05 NOTE — Assessment & Plan Note (Deleted)
 A1c 6.0, discharged on 6 units LAI daily and SSI. -CBGs Q6h while NPO -No LAI while NPO, dose as needed based on CBGs with goal 140-180 -Sensitive SSI -Still holding Farxiga from last admission as noted above

## 2023-05-05 NOTE — Progress Notes (Signed)
Patient Name: Tonya Glass Date of Encounter: 05/05/2023 [facility] Cardiologist: Chrystie Nose, MD   Interval Summary  .    BP 136/48. Incomplete I/Os.  Stable renal function (1.88>1.79). Denies any chest pain or dyspnea  Vital Signs .    Vitals:   05/04/23 2359 05/05/23 0412 05/05/23 0748 05/05/23 0848  BP: (!) 127/51 (!) 140/59  (!) 136/48  Pulse: 71 72  70  Resp:    18  Temp: 98.7 F (37.1 C) 98.7 F (37.1 C)  98.7 F (37.1 C)  TempSrc: Oral Oral  Oral  SpO2: 100% 96% 96% 100%  Weight:      Height:        Intake/Output Summary (Last 24 hours) at 05/05/2023 1224 Last data filed at 05/05/2023 0847 Gross per 24 hour  Intake 370 ml  Output 100 ml  Net 270 ml       05/02/2023    5:29 AM 04/30/2023    7:46 PM 04/24/2023    7:23 AM  Last 3 Weights  Weight (lbs) 221 lb 1.9 oz 211 lb 10.3 oz 211 lb 10.3 oz  Weight (kg) 100.3 kg 96 kg 96 kg      Telemetry/ECG    APVP - Personally Reviewed  Physical Exam .    VS:  BP (!) 136/48 (BP Location: Right Wrist)   Pulse 70   Temp 98.7 F (37.1 C) (Oral)   Resp 18   Ht 5' (1.524 m)   Wt 100.3 kg   SpO2 100%   BMI 43.18 kg/m  , BMI Body mass index is 43.18 kg/m. GENERAL:  Chronically ill-appearing.  No acute distress.  NECK:  No jugular venous distention LUNGS:  Diminished breath sounds bilaterally.  HEART:  RRR.  , II/VI systolic murmur  ABD:  nondistended EXT:  trace bilateral pitting LE edema SKIN:  Pallor.  Stasis dermatitis.   NEURO:  alert and oriented PSYCH:  normal affect   Assessment & Plan .     Tonya Glass is a 63F with HFrEF, atrial flutter, aortic root repair, bioprosthetic AVR, PPM, non-obstructive CAD, hypoxic respiratory failure on home O2, suspected OHS, refusal of sleep study, cor pulmonale, LV thrombus admitted with acute on chronic HFrEF and anemia.     # Acute on chronic combined heart failure:  # RV failure: # Moderate pleural effusions: LVEF 35-40%.  Lasix held  yesterday given worsening renal function, creatinine improved today.  Transition to p.o. torsemide.  Continue carvedilol, hydralazine and Imdur.    # Anemia:  Imaging concerning for small intramuscular abdominal wall hematoma.  Unclear how much this is contributing to her anemia.  FOBT +.  EGD 12/20 showed bleeding angioectasias in the stomach, treated with APC.  Can restart Eliquis today per GI.Marland Kitchen  H/H stable    # Elevated troponin: # Chest pain:  Challenging to characterize her chest pain.  HS troponin is mildly elevated and flat.  More consistent with demand ischemia and she isn't a cath candidate at this time given her GI bleed and CKD IV.      # Hypertension: Continue carvedilol, hydralazine and Imdur.    # Atrial flutter:  Currently in sinus rhythm.  Eliquis on hold.  Continue carvedilol.  # OSA/OHS: Continue to recommend outpatient sleep evaluation.   # LV apical thrombus:  Noted on prior echo.  Eliquis held due to bleeding as above, okay to restart today per GI  [facility] will sign off.   Medication  Recommendations: Per [facility] Other recommendations (labs, testing, etc): BMET in 1 week Follow up as an outpatient: We will schedule   For questions or updates, please contact [facility] Please consult [email or web address] for contact info under        Signed, Little Ishikawa, MD

## 2023-05-05 NOTE — TOC Transition Note (Addendum)
Transition of Care [facility]) - Discharge Note   Patient Details  Name: Tonya Glass MRN: [id number] Date of Birth: 1945/12/27  [facility]) CM/SW Contact:  Deatra Robinson, [place] Phone Number: 05/05/2023, 2:17 PM   Clinical Narrative: pt for dc back to [facility] today. Spoke with Belenda Cruise in admissions who confirmed they are prepared to admit pt back to room 10. Home and Community/[employer] auth submitted per [facility] request as a courtesy, reference [id number]. Pt's dtr Beth aware of dc and reports agreeable. RN provided with number for report and [facility] arranged for transport. SW signing off at dc.   Dellie Burns, MSW, LCSW [phone] (coverage)        Final next level of care: Skilled Nursing Facility Barriers to Discharge: Barriers Resolved   Patient Goals and CMS Choice Patient states their goals for this hospitalization and ongoing recovery are:: return to [email or web address].gov Compare Post Acute Care list provided to:: Patient Choice offered to / list presented to : Patient [place] ownership interest in [facility].provided to:: Patient    Discharge Placement              Patient [facility] bed at: [facility] Patient to be transferred to facility by: [facility] Name of family member notified: Beth/Dtr Patient and family notified of of transfer: 05/05/23  Discharge Plan and Services Additional resources added to the After Visit Summary for       Post Acute Care Choice: Skilled Nursing Facility                               Social Drivers of Health (SDOH) Interventions SDOH Screenings   Food Insecurity: No Food Insecurity (05/01/2023)  Housing: Low Risk  (05/01/2023)  Transportation Needs: No Transportation Needs (05/01/2023)  Utilities: Not At Risk (05/01/2023)  Social Connections: Unknown (09/25/2021)   Received from [facility]  Tobacco Use: Medium Risk (05/03/2023)     Readmission Risk  Interventions     No data to display

## 2023-05-05 NOTE — Assessment & Plan Note (Deleted)
S/p Lasix 60 mg IV yesterday, creatinine now bumped 1.48>1.88.  Will hold diuresis today to allow for renovascular equilibrium. -Hold IV Lasix pending creatinine improvement -Cardiology following, appreciate recs -Strict I&Os, daily weights -Continue carvedilol 3.125 mg BID -AM BMP

## 2023-05-05 NOTE — Discharge Summary (Addendum)
Family Medicine Teaching [facility] Discharge Summary  Patient name: Tonya Glass Medical record number: [id number] Date of birth: 1945/06/13 Age: 77 y.o. Gender: female Date of Admission: 04/30/2023  Date of Discharge: 05/05/23  Admitting Physician: Nelia Shi, MD  Primary Care Provider: No primary care provider on file. Consultants: GI, Cardiology, Pulmonology, Wound Care   Indication for Hospitalization: Acute symptomatic anemia due to upper GI bleed related to angioectasia in setting of Eliquis   Discharge Diagnoses/Problem List:  Principal Problem for Admission: Acute anemia 2/2 to GI bleed and heart failure with reduced ejection fraction  exacerbation  Other Problems addressed during stay:  Principal Problem:   Symptomatic anemia Active Problems:   T2DM (type 2 diabetes mellitus) (HCC)   Acute kidney injury 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)   COPD (chronic obstructive pulmonary disease) (HCC)   Concern for multidrug resistant pneumonia   HFrEF (heart failure with reduced ejection fraction) (HCC)   Lumbar compression fracture (HCC)   Heme positive stool   Left ventricular thrombus   Closed compression fracture of second lumbar vertebra (HCC)   Gastrointestinal hemorrhage with melena   Sacral wound   Hematoma of right flank   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 year old with a history of HFpEF, hypertension, sick sinus syndrome status post ICD, aortic stenosis status post valve replacement, CKD 3B, hypothyroidism, COPD, type 2 diabetes who presented for weakness and back pain and was found to be anemic with hemoglobin to 6.9 and positive FOBT.  Acute blood loss anemia Patient received 2 units of PRBCs with appropriate response GI was consulted given  positive FOBT, who recommended EGD.  Patient was started on PPI and Eliquis was held in setting of acute bleed.  EGD done on 12/20 which showed 4 cm hiatal hernia with 2 mm angio ectasia, which was bleeding.  Cauterization to stop bleeding by argon plasma was successful.  GI recommended PPI twice daily for 4 weeks (end 06/05/23) and Carafate daily.  Patient's hemoglobin remained stable for 48 hours, so she was started back on Eliquis prior to discharge. She will need follow up CBC regularly.   Acute CHF exacerbation BNP with elevation and signs of volume overload on exam.  Patient was recently discharged on torsemide 60 mg twice daily and seems to been compliant on this.  Cardiology was consulted given concern for worsening of heart failure.  Patient was diuresed with 60 mg IV Lasix for two days. Patients kidney function worsened slightly, so patient was transitioned back to home meds and Cr improved. Patient was discharged on home diuretic.  Key to management is that she uses her nightly bipap.   Lumbar compression fracture L2 fracture seen on imaging.  Discussed with neurosurgery who believes this may be an old fracture and did not recommend any intervention at this time.  Patients pain was managed with scheduled tylenol.   Multifocal pneumonia Imaging showed concern for multifocal pneumonia and patient describes recent cough in setting of COPD.  Patient was afebrile without leukocytosis and recently was treated for hap, thus pulmonology was consulted for possible underlying pathology.  Pulmonology believed likely multifocal pneumonia, though investigated autoimmune causes.  Lab work showed negative strep pneumo urinary antigen and negative legionella.  RPP was negative. ANA positive with concern for rheumatic disease, pulmonology to  follow up outpatient. Speech eval was conducted for concern of aspiration pneumonitis.  Patient declined barium swallow. Patient was treated for CAP with ceftriaxone 2 g  (12/17-12/21) and azithromycin (12/18-12/20). Hypersensitivity pneumonitis panel pending at discharge.   Goals of Care Given patient has had multiple admissions and multiple ED visits with high disease burden, palliative was consulted to discuss goals of care with patient and family. On day of discharge, patient believes she will likely pursue hospice in near future.   Other chronic conditions were medically managed with home medications and formulary alternatives as necessary.  PCP Follow-up Recommendations: Parameters to return to hospital should be Hgb<8 or Cr >2.5 Look into sleep study for possible OSA/OHS, Cardiology previously recommended outpatient, please continue nighttime CPAP Will need pantoprazole 40 mg BID for 4 weeks until 1/15 then should be daily  Patient should have outpatient pulm follow-up with repeat imaging to rule out underlying pulmonary disease  Continue GOC conversation with palliative at [facility], would benefit from discussion re hospice.  Repeat CBC and BMP within 1 week Consider stopping trazodone Repeat CXR in 4-6 weeks--may benefit form Pulmonary follow up    Disposition: SNF  Discharge Condition: Stable  Discharge Exam:  Vitals:   05/05/23 0748 05/05/23 0848  BP:  (!) 136/48  Pulse:  70  Resp:  18  Temp:  98.7 F (37.1 C)  SpO2: 96% 100%   General: A&O, NAD HEENT: No sign of trauma, EOM grossly intact Cardiac: RRR, prosthetic valve mumur Respiratory: CTAB, normal WOB on 2L [place]  GI: Soft, non-distended  Extremities: No peripheral edema  Neuro:A&O to self, situation. No gross neurological deficits.  Psych: Appropriate mood and affect   Significant Procedures: EGD  Significant Labs and Imaging:  Recent Labs  Lab 05/04/23 0539 05/05/23 0518 05/05/23 1303  WBC 10.1 10.2 10.2  HGB 10.6* 11.2* 10.8*  HCT 34.6* 36.6 34.8*  PLT 231 247 251   Recent Labs  Lab 05/04/23 0539 05/05/23 0518  NA 145 139  K 3.8 3.4*  CL 106 103  CO2 27 24   GLUCOSE 155* 170*  BUN 63* 66*  CREATININE 1.88* 1.79*  CALCIUM 8.7* 8.7*  MG 2.1 2.2   Full work up by pulmonology not included, please see labs for these results.      Pertinent Imaging   CT Chest/A/P:  1. Moderate to advanced multifocal lung opacities with ground-glass and nodular configuration, suspicious for multifocal pneumonia. Airspace nodularity obscures the previous left lower lobe pulmonary nodule. 2. Small left and small to moderate right pleural effusions. Consolidation in the dependent right lower lobe may represent compressive atelectasis or infection. 3. Mildly enlarged and borderline mediastinal lymph nodes, likely reactive. 4. Undulation of inferior L2 vertebral body is new from prior exam, suspicious for subacute or acute compression fracture. Recommend correlation with focal tenderness. 5. A 2.3 x 1.2 cm hyperdensity in the right lateral abdominal wall musculature is new from prior exam, suspicious for small intramuscular abdominal wall hematoma.  Results/Tests Pending at Time of Discharge:  Hypersensitive pneumonitis panel   Discharge Medications:  Allergies as of 05/05/2023   No Known Allergies      Medication List     TAKE these medications    acetaminophen 325 MG tablet Commonly known as: TYLENOL Take 650 mg by mouth every 8 (eight) hours as needed for mild pain (pain score 1-3).   alendronate 70 MG tablet Commonly known as: FOSAMAX Take 70 mg by mouth every Sunday.   Aquaphor Adv Therapy  Healing 41 % Oint Apply 1 Application topically See admin instructions. Apply 1 application topically every shift, apply to sacrum wound.   atorvastatin 40 MG tablet Commonly known as: LIPITOR Take 40 mg by mouth daily.   Benzethonium Chloride 0.13 % Liqd Apply 1 Application topically 2 (two) times a week. On Tuesdays and Fridays   budesonide-formoterol 160-4.5 MCG/ACT inhaler Commonly known as: SYMBICORT Inhale 2 puffs into the lungs 2 (two)  times daily.   carvedilol 3.125 MG tablet Commonly known as: COREG Take 1 tablet (3.125 mg total) by mouth 2 (two) times daily.   Cholecalciferol 50 MCG (2000 UT) Tabs Take 1 tablet by mouth daily.   Eliquis 5 MG Tabs tablet Generic drug: apixaban Take 5 mg by mouth 2 (two) times daily.   feeding supplement (PRO-STAT SUGAR FREE 64) Liqd Take 30 mLs by mouth 2 (two) times daily.   FLUoxetine 40 MG capsule Commonly known as: PROZAC Take 40 mg by mouth daily.   gabapentin 600 MG tablet Commonly known as: NEURONTIN Take 600 mg by mouth at bedtime.   guaiFENesin 600 MG 12 hr tablet Commonly known as: MUCINEX Take 600 mg by mouth 2 (two) times daily.   HumaLOG KwikPen 100 UNIT/ML KwikPen Generic drug: insulin lispro Inject into the skin 2 (two) times daily. Sliding scale   hydrALAZINE 25 MG tablet Commonly known as: APRESOLINE Take 1 tablet (25 mg total) by mouth every 8 (eight) hours.   ipratropium 0.06 % nasal spray Commonly known as: ATROVENT Place 2 sprays into both nostrils 3 (three) times daily.   Iron 325 (65 Fe) MG Tabs Take 325 mg by mouth daily.   isosorbide mononitrate 30 MG 24 hr tablet Commonly known as: IMDUR Take 0.5 tablets (15 mg total) by mouth daily.   Lantus SoloStar 100 UNIT/ML Solostar Pen Generic drug: insulin glargine Inject 6 Units into the skin at bedtime.   levocetirizine 5 MG tablet Commonly known as: XYZAL Take 5 mg by mouth at bedtime.   levothyroxine 75 MCG tablet Commonly known as: SYNTHROID Take 75 mcg by mouth daily.   magnesium oxide 400 (240 Mg) MG tablet Commonly known as: MAG-OX Take 400 mg by mouth daily.   nystatin ointment Commonly known as: MYCOSTATIN Apply 1 application  topically daily as needed (rash). 100,000- units/gram   Nyamyc powder Generic drug: nystatin Apply 1 Application topically 2 (two) times daily.   pantoprazole 40 MG tablet Commonly known as: PROTONIX Take 1 tablet (40 mg total) by mouth 2  (two) times daily before a meal. What changed: when to take this   ProAir RespiClick 108 (90 Base) MCG/ACT Aepb Generic drug: Albuterol Sulfate Inhale 2 puffs into the lungs 4 (four) times daily as needed.   sodium chloride 0.65 % Soln nasal spray Commonly known as: OCEAN Place 2 sprays into both nostrils every 6 (six) hours as needed for congestion.   sucralfate 1 g tablet Commonly known as: CARAFATE Take 1 g by mouth 4 (four) times daily -  with meals and at bedtime.   torsemide 20 MG tablet Commonly known as: DEMADEX Take 60 mg by mouth 2 (two) times daily.   traZODone 50 MG tablet Commonly known as: DESYREL Take 0.5 tablets (25 mg total) by mouth at bedtime.       Discharge Instructions: Please refer to Patient Instructions section of EMR for full details.  Patient was counseled important signs and symptoms that should prompt return to medical care, changes in medications, dietary instructions, activity  restrictions, and follow up appointments.   Follow-Up Appointments: Please follow up with PCP in 2-5 days after discharge.   Georg Ruddle Mahnoor, MD 05/05/2023, 1:19 PM PGY-1, [facility]    I have evaluated this patient along with Dr. Georg Ruddle and reviewed the above note, making necessary revisions.  Dorothyann Gibbs, MD 05/05/2023, 1:31 PM PGY-3, [facility]

## 2023-05-05 NOTE — Assessment & Plan Note (Deleted)
Creatinine now bumped in setting of IV diuresis.  Will continue to monitor while she is admitted. Patient is likely approaching baseline. -AM BMP

## 2023-05-05 NOTE — Progress Notes (Signed)
Speech Language Pathology Treatment: Dysphagia  Patient Details Name: Tonya Glass MRN: [id number] DOB: 1946/02/22 Today's Date: 05/05/2023 Time: 0865-7846 SLP Time Calculation (min) (ACUTE ONLY): 10 min  Assessment / Plan / Recommendation Clinical Impression  Pt politely but firmly declines MBS. She is hoping to D/C today. She accepted thin liquids and self-fed without overt s/s of dysphagia today - no coughing over multiple boluses.  EGD findings of tortuous esophagus could explain some of her clinical symptoms related to dysphagia. For now, issues with swallowing are not a top priority for her and she is not likely to want to return for an MBS as an OP.  Recommend that she continue on a regular diet, thin liquids.  She will be getting Palliative care support at [facility].  No further SLP f/u is warranted - our service will sign off.   HPI HPI: Patient is a 77 y.o. female, long term resident at [facility]. PMH: GERD, SOB, HFpEF, HLD, Aortic stenosis s/p valve replacement, Sick sinus syndrome, Hypothyroidism, HTN, CKD, COPD on 4L oxygen at baseline, T2DM. She was recently admitted at [facility] 11/27-12/11/24 with CHF exacerbation. She presented back to the hospital on 05/01/23 with back pain and feeling weak starting two days prior to admission. She did endorse one episode of emesis and productive cough of green phlegm for past several days. In ED, she was tachypneic, on home oxygen level of 4L. CT chest abd pelvis with small abdominal wall intramuscular hematoma, multifocal pneumonia and small pleural effusion. GI consulted for GI bleed, MD ordered SLP swallow evaluation to determine need for modified barium swallow study to r/o aspiration. EGD 12/20 showed mildly tortuous lower third of esophagus.      SLP Plan  Discharge SLP treatment due to (comment)      Recommendations for follow up therapy are one component of a multi-disciplinary discharge planning process, led by the attending  physician.  Recommendations may be updated based on patient status, additional functional criteria and insurance authorization.    Recommendations  Diet recommendations: Regular;Thin liquid Liquids provided via: Cup;Straw Medication Administration: Whole meds with liquid Supervision: Patient able to self feed Compensations: Slow rate;Small sips/bites                  Oral care BID     Dysphagia, unspecified (R13.10)     Discharge SLP treatment due to (comment)    Tonya Sheehan L. Samson Frederic, MA CCC/SLP Clinical Specialist - Acute Care SLP Acute Rehabilitation Services Office number [phone]  Tonya Glass  05/05/2023, 8:20 AM

## 2023-05-05 NOTE — Progress Notes (Signed)
Late entry 05-04-23 0223H patient refuse to go to bed, refuse to wear scd and refuses to wear BIPAP. Explained the consequences of her choice and notified Dr. Vonna Drafts about the patient's decision.

## 2023-05-05 NOTE — Assessment & Plan Note (Deleted)
Suspect acute fracture d/t osteoporosis in setting of physical therapy. -Neurosurgery consulted, appreciate recs -Calcitonin nasal spray daily -Continue tylenol 650 mg Q5h -PT/OT eval and treat

## 2023-05-05 NOTE — Telephone Encounter (Signed)
Please schedule hospital follow up in the next 4 weeks with  myself or APP

## 2023-05-05 NOTE — Assessment & Plan Note (Deleted)
No shortness of breath overnight or on BiPAP. -DuoNebs Q6h PRN -Nightly BiPAP -Continue Dulera 2 puffs twice daily -Continue mucinex for cough  -Titrate O2 to 88-92%, on home 4 L w/ exertion

## 2023-05-05 NOTE — Plan of Care (Signed)
  Problem: Coping: Goal: Ability to adjust to condition or change in health will improve Outcome: Progressing   Problem: Metabolic: Goal: Ability to maintain appropriate glucose levels will improve Outcome: Progressing   Problem: Nutritional: Goal: Maintenance of adequate nutrition will improve Outcome: Progressing   Problem: Pain Management: Goal: General experience of comfort will improve Outcome: Progressing   Problem: Safety: Goal: Ability to remain free from injury will improve Outcome: Progressing

## 2023-05-05 NOTE — Assessment & Plan Note (Deleted)
Nursing noted sacral skin breakdown. -WOC consulted, nursing to follow wound care recs

## 2023-05-05 NOTE — Plan of Care (Signed)
Conts on 2L [place], neb treatment and insulin coverage.   Problem: Education: Goal: Ability to describe self-care measures that may prevent or decrease complications (Diabetes Survival Skills Education) will improve Outcome: Not Met (add Reason) Goal: Individualized Educational Video(s) Outcome: Not Met (add Reason)   Problem: Coping: Goal: Ability to adjust to condition or change in health will improve Outcome: Not Met (add Reason)   Problem: Fluid Volume: Goal: Ability to maintain a balanced intake and output will improve Outcome: Not Met (add Reason)   Problem: Health Behavior/Discharge Planning: Goal: Ability to identify and utilize available resources and services will improve Outcome: Not Met (add Reason) Goal: Ability to manage health-related needs will improve Outcome: Not Met (add Reason)   Problem: Metabolic: Goal: Ability to maintain appropriate glucose levels will improve Outcome: Not Met (add Reason)   Problem: Nutritional: Goal: Maintenance of adequate nutrition will improve Outcome: Not Met (add Reason) Goal: Progress toward achieving an optimal weight will improve Outcome: Not Met (add Reason)   Problem: Skin Integrity: Goal: Risk for impaired skin integrity will decrease Outcome: Not Met (add Reason)   Problem: Tissue Perfusion: Goal: Adequacy of tissue perfusion will improve Outcome: Not Met (add Reason)   Problem: Education: Goal: Knowledge of General Education information will improve Description: Including pain rating scale, medication(s)/side effects and non-pharmacologic comfort measures Outcome: Not Met (add Reason)   Problem: Health Behavior/Discharge Planning: Goal: Ability to manage health-related needs will improve Outcome: Not Met (add Reason)   Problem: Clinical Measurements: Goal: Ability to maintain clinical measurements within normal limits will improve Outcome: Not Met (add Reason) Goal: Will remain free from infection Outcome: Not  Met (add Reason) Goal: Diagnostic test results will improve Outcome: Not Met (add Reason) Goal: Respiratory complications will improve Outcome: Not Met (add Reason) Goal: Cardiovascular complication will be avoided Outcome: Not Met (add Reason)   Problem: Activity: Goal: Risk for activity intolerance will decrease Outcome: Not Met (add Reason)   Problem: Nutrition: Goal: Adequate nutrition will be maintained Outcome: Not Met (add Reason)   Problem: Coping: Goal: Level of anxiety will decrease Outcome: Not Met (add Reason)   Problem: Elimination: Goal: Will not experience complications related to bowel motility Outcome: Not Met (add Reason) Goal: Will not experience complications related to urinary retention Outcome: Not Met (add Reason)   Problem: Pain Management: Goal: General experience of comfort will improve Outcome: Not Met (add Reason)   Problem: Safety: Goal: Ability to remain free from injury will improve Outcome: Not Met (add Reason)   Problem: Skin Integrity: Goal: Risk for impaired skin integrity will decrease Outcome: Not Met (add Reason)

## 2023-05-05 NOTE — Progress Notes (Signed)
Patient refuses to go to the bed and prefers to stay on the chair. Offered and explained to consequence of staying in the chair, still the patient prefers to stay in the chair.

## 2023-05-05 NOTE — Assessment & Plan Note (Deleted)
S/p endoscopy with cauterization of angio ectasia.  Hemoglobin improved this morning.   -GI following, appreciate recs -Restart Eliquis at 48 hours on 12/22 per GI recs -Continue pantoprazole 40 mg BID (x4 weeks from 12/18), carafate 1 g QID -Transfusion threshold Hgb<8 -Daily CBC

## 2023-05-05 NOTE — Assessment & Plan Note (Deleted)
Pulmonology following, who believe this may be multifocal PNA but are completing work up to rule out autoimmune process.  MRSA negative. -Pulmonology following, appreciate recs -SLP eval with modified barium swallow to rule out aspiration -Ceftriaxone 2 g (12/17-12/21) and azithromycin (12/18 -12/20)

## 2023-05-05 NOTE — Progress Notes (Signed)
Called and gave report to facility,. All questions were answered.

## 2023-05-05 NOTE — NC FL2 (Signed)
[place] MEDICAID FL2 LEVEL OF CARE FORM     IDENTIFICATION  Patient Name: Tonya Glass Birthdate: [birth year] Sex: female Admission Date (Current Location): 04/30/2023  [facility] and [place] Number:  Producer, television/film/video and Address:  [facility] [employer], 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 05/03/2023   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   Closed compression fracture of second lumbar vertebra (HCC) 05/02/2023   Gastrointestinal hemorrhage with melena 05/02/2023   Sacral wound 05/02/2023   Symptomatic anemia 05/01/2023   HFrEF (heart failure with reduced ejection fraction) (HCC) 05/01/2023   Lumbar compression fracture (HCC) 05/01/2023   Heme positive stool 05/01/2023   SOB (shortness of breath) 04/18/2023   Pulmonary hypertension, unspecified (HCC) 04/15/2023   Concern for multidrug resistant pneumonia 04/15/2023   Acute systolic heart failure (HCC) 04/14/2023   Acute kidney injury 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) 04/11/2023   Anemia 04/11/2023   COPD (chronic obstructive pulmonary disease) (HCC) 04/11/2023   GERD (gastroesophageal reflux disease) 04/11/2023   Acute exacerbation of CHF (congestive heart failure) (HCC) 04/10/2023   Pressure injury of skin 06/23/2022   Cor pulmonale, chronic (HCC) 09/12/2021   PAH (pulmonary artery hypertension) (HCC)  09/12/2021   Peripheral venous insufficiency 09/12/2021   Stage 3b chronic kidney disease (HCC) 09/12/2021   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   Paravalvular leak (prosthetic valve) 08/07/2021   Nonrheumatic mitral valve regurgitation    Venous stasis ulcers of both lower extremities (HCC) 04/13/2021   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   Thoracic aortic aneurysm without rupture (HCC) 03/19/2018   Nonrheumatic aortic (valve) insufficiency 03/19/2018   Acute CHF (congestive heart failure) (HCC) 08/18/2017   Acute diastolic CHF (congestive heart failure) (HCC) 08/18/2017   CHF (congestive heart failure) (HCC) 08/18/2017   Hypothyroidism 07/23/2017   Acute on chronic hypoxic respiratory failure (HCC) 07/23/2017   Aortic valve stenosis 10/10/2016   Diastolic murmur 08/02/2016   SSS (sick sinus syndrome) (HCC) 03/20/2016   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   Atherosclerosis of native coronary artery of native heart without angina pectoris 09/06/2014   Pacemaker 05/28/2013   Essential hypertension 05/28/2013   Dyslipidemia 05/28/2013   S/P aortic valve replacement with bioprosthetic valve 05/28/2013   T2DM (type 2 diabetes mellitus) (HCC) 05/28/2013   Chronic heart failure (HCC) 05/28/2013    Orientation RESPIRATION BLADDER Height & Weight     Self, Time, Situation, Place  Normal External catheter Weight: 221 lb 1.9 oz (100.3 kg) Height:  5' (152.4 cm)  BEHAVIORAL SYMPTOMS/MOOD NEUROLOGICAL BOWEL NUTRITION STATUS      Continent Diet (see dc summary)  AMBULATORY STATUS COMMUNICATION OF NEEDS Skin     Verbally Surgical wounds  Personal Care Assistance Level of Assistance  Bathing, Feeding, Dressing Bathing Assistance: Maximum assistance Feeding assistance: Limited assistance Dressing Assistance: Maximum assistance     Functional Limitations Info  Sight,  Hearing, Speech Sight Info: Adequate Hearing Info: Adequate Speech Info: Adequate    SPECIAL CARE FACTORS FREQUENCY  PT (By licensed PT), OT (By licensed OT)                    Contractures Contractures Info: Not present    Additional Factors Info  Code Status Code Status Info: DNR Limited             Current Medications (05/05/2023):  This is the current hospital active medication list Current Facility-Administered Medications  Medication Dose Route Frequency Provider Last Rate Last Admin   acetaminophen (TYLENOL) tablet 650 mg  650 mg Oral Q6H Shitarev, Dimitry, MD   650 mg at 05/05/23 1234   apixaban (ELIQUIS) tablet 5 mg  5 mg Oral BID Westley Chandler, MD   5 mg at 05/05/23 1233   atorvastatin (LIPITOR) tablet 40 mg  40 mg Oral Daily Shitarev, Dimitry, MD   40 mg at 05/05/23 0845   carvedilol (COREG) tablet 3.125 mg  3.125 mg Oral BID Shitarev, Dimitry, MD   3.125 mg at 05/05/23 0845   cholecalciferol (VITAMIN D3) 25 MCG (1000 UNIT) tablet 1,000 Units  1,000 Units Oral Daily Shitarev, Dimitry, MD   1,000 Units at 05/05/23 0844   FLUoxetine (PROZAC) capsule 40 mg  40 mg Oral Daily Shitarev, Dimitry, MD   40 mg at 05/05/23 0844   gabapentin (NEURONTIN) capsule 600 mg  600 mg Oral QHS Shitarev, Dimitry, MD   600 mg at 05/04/23 2111   guaiFENesin (MUCINEX) 12 hr tablet 600 mg  600 mg Oral BID Shitarev, Dimitry, MD   600 mg at 05/05/23 0844   hydrALAZINE (APRESOLINE) tablet 25 mg  25 mg Oral Q8H Shitarev, Dimitry, MD   25 mg at 05/05/23 0606   insulin aspart (novoLOG) injection 0-9 Units  0-9 Units Subcutaneous TID AC Shitarev, Dimitry, MD   3 Units at 05/05/23 1200   ipratropium (ATROVENT) 0.06 % nasal spray 2 spray  2 spray Each Nare TID Shitarev, Dimitry, MD   2 spray at 05/05/23 0844   ipratropium-albuterol (DUONEB) 0.5-2.5 (3) MG/3ML nebulizer solution 3 mL  3 mL Nebulization Q6H PRN Baloch, Mahnoor, MD   3 mL at 05/03/23 1754   isosorbide mononitrate (IMDUR) 24 hr  tablet 15 mg  15 mg Oral Daily Shitarev, Dimitry, MD   15 mg at 05/05/23 0845   levothyroxine (SYNTHROID) tablet 75 mcg  75 mcg Oral Daily Shitarev, Dimitry, MD   75 mcg at 05/05/23 0606   mometasone-formoterol (DULERA) 200-5 MCG/ACT inhaler 2 puff  2 puff Inhalation BID Shitarev, Dimitry, MD   2 puff at 05/05/23 0748   Oral care mouth rinse  15 mL Mouth Rinse PRN Westley Chandler, MD       pantoprazole (PROTONIX) EC tablet 40 mg  40 mg Oral BID AC Pyrtle, Carie Caddy, MD   40 mg at 05/05/23 0845   potassium chloride SA (KLOR-CON M) CR tablet 40 mEq  40 mEq Oral BID Shelby Mattocks, DO   40 mEq at 05/05/23 0844   sodium chloride flush (NS) 0.9 % injection 3-10 mL  3-10 mL Intravenous Q12H Westley Chandler, MD   10 mL at 05/05/23 0847   sodium chloride flush (NS) 0.9 % injection 3-10 mL  3-10  mL Intravenous PRN Westley Chandler, MD       sucralfate (CARAFATE) tablet 1 g  1 g Oral TID WC & HS Shitarev, Dimitry, MD   1 g at 05/05/23 1233   torsemide (DEMADEX) tablet 60 mg  60 mg Oral BID Westley Chandler, MD   60 mg at 05/05/23 1233   traZODone (DESYREL) tablet 25 mg  25 mg Oral QHS Shitarev, Dimitry, MD   25 mg at 05/04/23 2111     Discharge Medications: Please see discharge summary for a list of discharge medications.  Relevant Imaging Results:  Relevant Lab Results:   Additional Information SS# [id number] [place], [place]

## 2023-05-06 LAB — HYPERSENSITIVITY PNEUMONITIS
A. Pullulans Abs: NEGATIVE
A.Fumigatus #1 Abs: NEGATIVE
Micropolyspora faeni, IgG: NEGATIVE
Pigeon Serum Abs: NEGATIVE
Thermoact. Saccharii: NEGATIVE
Thermoactinomyces vulgaris, IgG: NEGATIVE

## 2023-05-08 ENCOUNTER — Encounter (HOSPITAL_COMMUNITY): Payer: Self-pay | Admitting: Internal Medicine

## 2023-05-20 ENCOUNTER — Other Ambulatory Visit: Payer: Self-pay

## 2023-05-20 ENCOUNTER — Emergency Department (HOSPITAL_COMMUNITY): Payer: 59

## 2023-05-20 ENCOUNTER — Inpatient Hospital Stay (HOSPITAL_COMMUNITY)
Admission: EM | Admit: 2023-05-20 | Discharge: 2023-05-23 | DRG: 191 | Disposition: A | Discharge status: Other Acute Inpatient Hospital | Payer: 59 | Source: Other Acute Inpatient Hospital | Attending: Family Medicine | Admitting: Family Medicine

## 2023-05-20 DIAGNOSIS — Z7951 Long term (current) use of inhaled steroids: Secondary | ICD-10-CM

## 2023-05-20 DIAGNOSIS — N184 Chronic kidney disease, stage 4 (severe): Secondary | ICD-10-CM | POA: Diagnosis present

## 2023-05-20 DIAGNOSIS — Z794 Long term (current) use of insulin: Secondary | ICD-10-CM

## 2023-05-20 DIAGNOSIS — E785 Hyperlipidemia, unspecified: Secondary | ICD-10-CM | POA: Diagnosis present

## 2023-05-20 DIAGNOSIS — R0602 Shortness of breath: Principal | ICD-10-CM | POA: Diagnosis present

## 2023-05-20 DIAGNOSIS — D539 Nutritional anemia, unspecified: Secondary | ICD-10-CM | POA: Diagnosis present

## 2023-05-20 DIAGNOSIS — Z87891 Personal history of nicotine dependence: Secondary | ICD-10-CM

## 2023-05-20 DIAGNOSIS — Z950 Presence of cardiac pacemaker: Secondary | ICD-10-CM

## 2023-05-20 DIAGNOSIS — I480 Paroxysmal atrial fibrillation: Secondary | ICD-10-CM | POA: Diagnosis present

## 2023-05-20 DIAGNOSIS — D6859 Other primary thrombophilia: Secondary | ICD-10-CM | POA: Diagnosis present

## 2023-05-20 DIAGNOSIS — Z7983 Long term (current) use of bisphosphonates: Secondary | ICD-10-CM

## 2023-05-20 DIAGNOSIS — Z79890 Hormone replacement therapy (postmenopausal): Secondary | ICD-10-CM

## 2023-05-20 DIAGNOSIS — Z515 Encounter for palliative care: Secondary | ICD-10-CM

## 2023-05-20 DIAGNOSIS — Z86711 Personal history of pulmonary embolism: Secondary | ICD-10-CM

## 2023-05-20 DIAGNOSIS — I4510 Unspecified right bundle-branch block: Secondary | ICD-10-CM | POA: Diagnosis present

## 2023-05-20 DIAGNOSIS — E1140 Type 2 diabetes mellitus with diabetic neuropathy, unspecified: Secondary | ICD-10-CM | POA: Diagnosis present

## 2023-05-20 DIAGNOSIS — Z8701 Personal history of pneumonia (recurrent): Secondary | ICD-10-CM

## 2023-05-20 DIAGNOSIS — I2729 Other secondary pulmonary hypertension: Secondary | ICD-10-CM | POA: Diagnosis present

## 2023-05-20 DIAGNOSIS — Z952 Presence of prosthetic heart valve: Secondary | ICD-10-CM

## 2023-05-20 DIAGNOSIS — E039 Hypothyroidism, unspecified: Secondary | ICD-10-CM | POA: Diagnosis present

## 2023-05-20 DIAGNOSIS — Z8719 Personal history of other diseases of the digestive system: Secondary | ICD-10-CM

## 2023-05-20 DIAGNOSIS — Z66 Do not resuscitate: Secondary | ICD-10-CM | POA: Diagnosis present

## 2023-05-20 DIAGNOSIS — J189 Pneumonia, unspecified organism: Secondary | ICD-10-CM

## 2023-05-20 DIAGNOSIS — Z1152 Encounter for screening for COVID-19: Secondary | ICD-10-CM

## 2023-05-20 DIAGNOSIS — I513 Intracardiac thrombosis, not elsewhere classified: Secondary | ICD-10-CM | POA: Diagnosis present

## 2023-05-20 DIAGNOSIS — E876 Hypokalemia: Secondary | ICD-10-CM | POA: Diagnosis present

## 2023-05-20 DIAGNOSIS — J449 Chronic obstructive pulmonary disease, unspecified: Secondary | ICD-10-CM | POA: Diagnosis not present

## 2023-05-20 DIAGNOSIS — J9611 Chronic respiratory failure with hypoxia: Secondary | ICD-10-CM | POA: Diagnosis present

## 2023-05-20 DIAGNOSIS — T829XXA Unspecified complication of cardiac and vascular prosthetic device, implant and graft, initial encounter: Secondary | ICD-10-CM | POA: Insufficient documentation

## 2023-05-20 DIAGNOSIS — L89310 Pressure ulcer of right buttock, unstageable: Secondary | ICD-10-CM | POA: Diagnosis present

## 2023-05-20 DIAGNOSIS — E1122 Type 2 diabetes mellitus with diabetic chronic kidney disease: Secondary | ICD-10-CM | POA: Diagnosis present

## 2023-05-20 DIAGNOSIS — Z9981 Dependence on supplemental oxygen: Secondary | ICD-10-CM

## 2023-05-20 DIAGNOSIS — I5022 Chronic systolic (congestive) heart failure: Secondary | ICD-10-CM | POA: Diagnosis present

## 2023-05-20 DIAGNOSIS — Y831 Surgical operation with implant of artificial internal device as the cause of abnormal reaction of the patient, or of later complication, without mention of misadventure at the time of the procedure: Secondary | ICD-10-CM | POA: Diagnosis present

## 2023-05-20 DIAGNOSIS — J441 Chronic obstructive pulmonary disease with (acute) exacerbation: Secondary | ICD-10-CM | POA: Diagnosis present

## 2023-05-20 DIAGNOSIS — M810 Age-related osteoporosis without current pathological fracture: Secondary | ICD-10-CM | POA: Diagnosis present

## 2023-05-20 DIAGNOSIS — I495 Sick sinus syndrome: Secondary | ICD-10-CM | POA: Diagnosis present

## 2023-05-20 DIAGNOSIS — I130 Hypertensive heart and chronic kidney disease with heart failure and stage 1 through stage 4 chronic kidney disease, or unspecified chronic kidney disease: Secondary | ICD-10-CM | POA: Diagnosis present

## 2023-05-20 DIAGNOSIS — F32A Depression, unspecified: Secondary | ICD-10-CM | POA: Diagnosis present

## 2023-05-20 DIAGNOSIS — I5023 Acute on chronic systolic (congestive) heart failure: Secondary | ICD-10-CM | POA: Diagnosis present

## 2023-05-20 DIAGNOSIS — Z8673 Personal history of transient ischemic attack (TIA), and cerebral infarction without residual deficits: Secondary | ICD-10-CM

## 2023-05-20 DIAGNOSIS — R0603 Acute respiratory distress: Secondary | ICD-10-CM | POA: Diagnosis present

## 2023-05-20 DIAGNOSIS — I878 Other specified disorders of veins: Secondary | ICD-10-CM | POA: Diagnosis present

## 2023-05-20 DIAGNOSIS — Z7901 Long term (current) use of anticoagulants: Secondary | ICD-10-CM

## 2023-05-20 DIAGNOSIS — I5082 Biventricular heart failure: Secondary | ICD-10-CM | POA: Diagnosis present

## 2023-05-20 DIAGNOSIS — Z79899 Other long term (current) drug therapy: Secondary | ICD-10-CM

## 2023-05-20 DIAGNOSIS — D649 Anemia, unspecified: Secondary | ICD-10-CM | POA: Diagnosis present

## 2023-05-20 DIAGNOSIS — L89152 Pressure ulcer of sacral region, stage 2: Secondary | ICD-10-CM | POA: Diagnosis present

## 2023-05-20 DIAGNOSIS — R0600 Dyspnea, unspecified: Secondary | ICD-10-CM | POA: Diagnosis present

## 2023-05-20 LAB — CBG MONITORING, ED: Glucose-Capillary: 116 mg/dL — ABNORMAL HIGH (ref 70–99)

## 2023-05-20 LAB — COMPREHENSIVE METABOLIC PANEL
ALT: 8 U/L (ref 0–44)
AST: 14 U/L — ABNORMAL LOW (ref 15–41)
Albumin: 2.5 g/dL — ABNORMAL LOW (ref 3.5–5.0)
Alkaline Phosphatase: 78 U/L (ref 38–126)
Anion gap: 14 (ref 5–15)
BUN: 38 mg/dL — ABNORMAL HIGH (ref 8–23)
CO2: 29 mmol/L (ref 22–32)
Calcium: 8.5 mg/dL — ABNORMAL LOW (ref 8.9–10.3)
Chloride: 101 mmol/L (ref 98–111)
Creatinine, Ser: 1.3 mg/dL — ABNORMAL HIGH (ref 0.44–1.00)
GFR, Estimated: 42 mL/min — ABNORMAL LOW (ref >60)
Glucose, Bld: 125 mg/dL — ABNORMAL HIGH (ref 70–99)
Potassium: 3.3 mmol/L — ABNORMAL LOW (ref 3.5–5.1)
Sodium: 144 mmol/L (ref 135–145)
Total Bilirubin: 1.4 mg/dL — ABNORMAL HIGH (ref 0.0–1.2)
Total Protein: 5.8 g/dL — ABNORMAL LOW (ref 6.5–8.1)

## 2023-05-20 LAB — SARS CORONAVIRUS 2 BY RT PCR: SARS Coronavirus 2 by RT PCR: NEGATIVE

## 2023-05-20 LAB — CBC WITH DIFFERENTIAL/PLATELET
Abs Immature Granulocytes: 0.13 10*3/uL — ABNORMAL HIGH (ref 0.00–0.07)
Basophils Absolute: 0.1 10*3/uL (ref 0.0–0.1)
Basophils Relative: 1 %
Eosinophils Absolute: 0.3 10*3/uL (ref 0.0–0.5)
Eosinophils Relative: 3 %
HCT: 33.9 % — ABNORMAL LOW (ref 36.0–46.0)
Hemoglobin: 9.4 g/dL — ABNORMAL LOW (ref 12.0–15.0)
Immature Granulocytes: 1 %
Lymphocytes Relative: 13 %
Lymphs Abs: 1.3 10*3/uL (ref 0.7–4.0)
MCH: 29.4 pg (ref 26.0–34.0)
MCHC: 27.7 g/dL — ABNORMAL LOW (ref 30.0–36.0)
MCV: 105.9 fL — ABNORMAL HIGH (ref 80.0–100.0)
Monocytes Absolute: 0.8 10*3/uL (ref 0.1–1.0)
Monocytes Relative: 8 %
Neutro Abs: 7.7 10*3/uL (ref 1.7–7.7)
Neutrophils Relative %: 74 %
Platelets: 284 10*3/uL (ref 150–400)
RBC: 3.2 MIL/uL — ABNORMAL LOW (ref 3.87–5.11)
RDW: 15.9 % — ABNORMAL HIGH (ref 11.5–15.5)
WBC: 10.3 10*3/uL (ref 4.0–10.5)
nRBC: 0.2 % (ref 0.0–0.2)

## 2023-05-20 LAB — MAGNESIUM: Magnesium: 2 mg/dL (ref 1.7–2.4)

## 2023-05-20 LAB — TROPONIN I (HIGH SENSITIVITY)
Troponin I (High Sensitivity): 48 ng/L — ABNORMAL HIGH (ref <18)
Troponin I (High Sensitivity): 44 ng/L — ABNORMAL HIGH (ref <18)

## 2023-05-20 MED ORDER — ATORVASTATIN CALCIUM 40 MG PO TABS
40.0000 mg | ORAL_TABLET | Freq: Every day | ORAL | Status: DC
  Administered 2023-05-20 – 2023-05-23 (×4): 40 mg via ORAL
  Filled 2023-05-20 (×4): qty 1

## 2023-05-20 MED ORDER — TERBINAFINE HCL 250 MG PO TABS
250.0000 mg | ORAL_TABLET | Freq: Every day | ORAL | Status: DC
Start: 2023-05-21 — End: 2023-05-24
  Administered 2023-05-22 – 2023-05-23 (×2): 250 mg via ORAL
  Filled 2023-05-20 (×3): qty 1

## 2023-05-20 MED ORDER — POTASSIUM CHLORIDE CRYS ER 20 MEQ PO TBCR: 40.0000 meq | EXTENDED_RELEASE_TABLET | Freq: Once | ORAL | Status: DC

## 2023-05-20 MED ORDER — SODIUM CHLORIDE 0.9 % IV SOLN
500.0000 mg | Freq: Once | INTRAVENOUS | Status: AC
  Administered 2023-05-20: 500 mg via INTRAVENOUS
  Filled 2023-05-20: qty 5

## 2023-05-20 MED ORDER — SODIUM CHLORIDE 0.9 % IV SOLN
1.0000 g | Freq: Once | INTRAVENOUS | Status: AC
  Administered 2023-05-20: 1 g via INTRAVENOUS
  Filled 2023-05-20: qty 10

## 2023-05-20 MED ORDER — ISOSORBIDE MONONITRATE ER 30 MG PO TB24
15.0000 mg | ORAL_TABLET | Freq: Every day | ORAL | Status: DC
  Administered 2023-05-21 – 2023-05-23 (×3): 15 mg via ORAL
  Filled 2023-05-20 (×3): qty 1

## 2023-05-20 MED ORDER — IPRATROPIUM BROMIDE 0.06 % NA SOLN: 2.0000 | Freq: Three times a day (TID) | NASAL | Status: DC

## 2023-05-20 MED ORDER — VITAMIN D 25 MCG (1000 UNIT) PO TABS
2000.0000 [IU] | ORAL_TABLET | Freq: Every day | ORAL | Status: DC
  Administered 2023-05-21 – 2023-05-23 (×3): 2000 [IU] via ORAL
  Filled 2023-05-20 (×3): qty 2

## 2023-05-20 MED ORDER — FLUOXETINE HCL 20 MG PO CAPS
40.0000 mg | ORAL_CAPSULE | Freq: Every day | ORAL | Status: DC
  Administered 2023-05-21 – 2023-05-23 (×3): 40 mg via ORAL
  Filled 2023-05-20 (×3): qty 2

## 2023-05-20 MED ORDER — LORATADINE 10 MG PO TABS
10.0000 mg | ORAL_TABLET | Freq: Every day | ORAL | Status: DC
  Administered 2023-05-20 – 2023-05-23 (×4): 10 mg via ORAL
  Filled 2023-05-20 (×4): qty 1

## 2023-05-20 MED ORDER — SUCRALFATE 1 G PO TABS
1.0000 g | ORAL_TABLET | Freq: Three times a day (TID) | ORAL | Status: DC
  Administered 2023-05-20 – 2023-05-23 (×11): 1 g via ORAL
  Filled 2023-05-20 (×12): qty 1

## 2023-05-20 MED ORDER — ACETAMINOPHEN 325 MG PO TABS: 650.0000 mg | ORAL_TABLET | Freq: Three times a day (TID) | ORAL | Status: DC | PRN

## 2023-05-20 MED ORDER — HYDRALAZINE HCL 25 MG PO TABS
25.0000 mg | ORAL_TABLET | Freq: Three times a day (TID) | ORAL | Status: DC
  Administered 2023-05-20 – 2023-05-23 (×5): 25 mg via ORAL
  Filled 2023-05-20 (×8): qty 1

## 2023-05-20 MED ORDER — INSULIN GLARGINE-YFGN 100 UNIT/ML ~~LOC~~ SOLN
6.0000 [IU] | Freq: Every day | SUBCUTANEOUS | Status: DC
  Administered 2023-05-20 – 2023-05-23 (×4): 6 [IU] via SUBCUTANEOUS
  Filled 2023-05-20 (×4): qty 0.06

## 2023-05-20 MED ORDER — INSULIN ASPART 100 UNIT/ML IJ SOLN
0.0000 [IU] | Freq: Three times a day (TID) | INTRAMUSCULAR | Status: DC
  Administered 2023-05-22: 1 [IU] via SUBCUTANEOUS
  Administered 2023-05-22: 5 [IU] via SUBCUTANEOUS
  Administered 2023-05-22: 1 [IU] via SUBCUTANEOUS
  Administered 2023-05-23: 2 [IU] via SUBCUTANEOUS
  Administered 2023-05-23 (×2): 1 [IU] via SUBCUTANEOUS

## 2023-05-20 MED ORDER — PANTOPRAZOLE SODIUM 40 MG PO TBEC
40.0000 mg | DELAYED_RELEASE_TABLET | Freq: Two times a day (BID) | ORAL | Status: DC
  Administered 2023-05-21 – 2023-05-23 (×5): 40 mg via ORAL
  Filled 2023-05-20 (×5): qty 1

## 2023-05-20 MED ORDER — MOMETASONE FURO-FORMOTEROL FUM 200-5 MCG/ACT IN AERO: 2.0000 | INHALATION_SPRAY | Freq: Two times a day (BID) | RESPIRATORY_TRACT | Status: DC

## 2023-05-20 MED ORDER — GABAPENTIN 300 MG PO CAPS
600.0000 mg | ORAL_CAPSULE | Freq: Every day | ORAL | Status: DC
  Administered 2023-05-20 – 2023-05-23 (×4): 600 mg via ORAL
  Filled 2023-05-20 (×4): qty 2

## 2023-05-20 MED ORDER — LEVOTHYROXINE SODIUM 75 MCG PO TABS
75.0000 ug | ORAL_TABLET | Freq: Every day | ORAL | Status: DC
  Administered 2023-05-21 – 2023-05-23 (×3): 75 ug via ORAL
  Filled 2023-05-20 (×3): qty 1

## 2023-05-20 MED ORDER — CARVEDILOL 3.125 MG PO TABS
3.1250 mg | ORAL_TABLET | Freq: Two times a day (BID) | ORAL | Status: DC
  Administered 2023-05-20 – 2023-05-23 (×4): 3.125 mg via ORAL
  Filled 2023-05-20 (×7): qty 1

## 2023-05-20 MED ORDER — TORSEMIDE 20 MG PO TABS
60.0000 mg | ORAL_TABLET | Freq: Two times a day (BID) | ORAL | Status: DC
  Administered 2023-05-21 – 2023-05-23 (×6): 60 mg via ORAL
  Filled 2023-05-20 (×6): qty 3

## 2023-05-20 MED ORDER — APIXABAN 5 MG PO TABS
5.0000 mg | ORAL_TABLET | Freq: Two times a day (BID) | ORAL | Status: DC
  Administered 2023-05-20 – 2023-05-23 (×7): 5 mg via ORAL
  Filled 2023-05-20 (×7): qty 1

## 2023-05-20 MED ORDER — FERROUS SULFATE 325 (65 FE) MG PO TABS
325.0000 mg | ORAL_TABLET | Freq: Every day | ORAL | Status: DC
  Administered 2023-05-21 – 2023-05-23 (×3): 325 mg via ORAL
  Filled 2023-05-20 (×3): qty 1

## 2023-05-20 NOTE — H&P (Addendum)
 Hospital Admission History and Physical Service Pager: [phone]  Patient name: Tonya Glass Medical record number: [id number] Date of Birth: 11-17-1945 Age: 78 y.o. Gender: female  Primary Care Provider: No primary care provider on file. Consultants: None Code Status: DNR/DNI Preferred Emergency Contact:  Contact Information     Name Relation Home Work Mobile   [place] Daughter   [phone]      Other Contacts   None on File    Chief Complaint: SOB  Assessment and Plan: Tonya Glass is a 78 y.o. 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  She has had 2 back-to-back admissions recently, 3 weeks ago with multifocal pneumonia and 3 weeks prior to that due to shortness of breath.   Differential for presentation of this includes: Pneumonitis/Multifocal PNA/Pulmonary Edema - most likely due to diffuse bilateral patchy opacities R > L seen on CXR and consistent with exam findings with diffuse crackles Acute on chronic CHF exacerbation - possible due to increased WOB and CXR with possible pleural effusion, but hasn't missed any doses of Torsemide   COPD exacerbation - No history of cough or sputum production PE - unlikely but considered due to increased work of breathing.  No tachycardia present and has been compliant with Eliquis  at home. Only missed morning dose today. NSTEMI/ACS - unlikely due to flat troponins and absence of chest pain  Patient was diagnosed with multifocal pneumonia at her last admission and was treated with ceftriaxone  and azithromycin  then.  Was treated with ceftriaxone  and azithromycin  again in the ED, covered for 24 hours. Assessment & Plan Shortness of breath Differential is broad as listed above for this patient  with recurrent admissions with shortness of breath and similar CXR findings previously. Has been previously worked up for autoimmune process (ANA +),  hypersensitivity pneumonitis, Legionella and Strep pneumo urine antigens which were negative. Pt was planning to f/u out pt w/ pulm w/in the next few weeks. Currently satting well on home O2. Will continue to monitor her symptoms.  - Admit to FMTS, attending Dr. Anders - Med-Tele, Vital signs per floor - Heart Healthy diet  - s/p CTX and Azithromycin  in the ED - AM BMP and procalcitonin to determine if continued antibiotics would be beneficial - Consider pulmonary consult in the AM - Considered giving a dose of IV Lasix , however per chart review she had a bump in her Cr with this in the past and was put back on her home Torsemide  dose during previous admission. Will provide evening dose of home Torsemide . -f/u bnp -O2 supplementation (home is 4 L), keep SpO2 88-92% given COPD   Anemia Chronic, baseline hemoglobin~9-10.  Did receive blood transfusions during last hospitalization in setting of GI bleed requiring cauterization for angiectasia.  No reports of blood in stool at this time.  CBC today indicates macrocytic anemia today. -CTM with CBC -Consider workup for macrocytosis if persists -Continue twice daily PPI -Continue iron supplement  Chronic and Stable Problems:  HFrEF: Continue Coreg  3.125 mg BID and Torsemide  60 mg BID HTN: Continue Imdur  15 mg daily, hydralazine  25 mg Q8h Hypothyroidism: Continue levothyroxine  75 mcg daily Depression: Continue fluoxetine  40 mg daily, held trazodone  25 mg nightly (recommended for PCP to discontinue at discharge last admission) Peripheral neuropathy: Continue  gabapentin  600 mg nightly Osteoporosis: Continue cholecalciferol  2000 IU daily.  Also on alendronate 70 mg every Sunday, hold inpatient. HLD: Continue home atorvastatin  40 mg daily Chronic LE venous stasis: continue to monitor T2DM: Continue home  semglee  6 U and sSSI - adjust as needed   FEN/GI: Heart healthy diet VTE Prophylaxis: Eliquis   Disposition: Med-Tele  History of Present Illness:  Tonya Glass is a 78 y.o. female presenting with shortness of breath at her nursing facility ([facility]).  Patient reports that there have been multiple cases of COVID-19 going around the nursing home.  Patient occasionally has cough with increased butyrin production.  Pt reports SOB for past 2 days. Has been taking her medications and inhalers as prescribed at the facility.  Denies any fever, chills, body aches, chest pain, cough, sputum production, abdominal pain, nausea, vomiting or diarrhea.  Feels like her hands are a little swollen over past 2 days. States she uses 4 L O2 at facility at baseline.   Patient was brought to the ED via EMS.  There seem to be a possible arrhythmia that was described by the paramedics as a wide-complex rhythm in association with SOB with wide-complex beats.  No hypoxic episodes or tachycardic episodes.  In the ED, patient was not tachycardic or hypoxic.  Remains stable on 4 L.  Troponins were checked due to paramedics described which seem to trend flat at 48 and 44, similar to her previous admission.  CBC with macrocytic anemia, hemoglobin 9.4, MCV 105.9, WBC normal at 10.3.  CMP demonstrated low K at 3.3, Cr 1.3 (baseline ~1.8), albumin 2.5, and AST 14.  COVID-19 negative.  CXR demonstrated diffuse bilateral patchy opacities R >L which may represent pulmonary edema or multifocal infection along with trace blunting of the costophrenic angles possibly representing trace pleural effusions.  Patient was started on antibiotics for CAP coverage - ceftriaxone  and azithromycin .  Review Of Systems: Per HPI with the following additions: as above  Pertinent Past Medical History: HFrEF HLD Aortic stenosis s/p valve replacement Sick sinus syndrome Hypothyroidism Peripheral neuropathy HTN CKD 3B COPD T2DM Remainder  reviewed in history tab.   Pertinent Past Surgical History: Coronary catheterization 2023 TEE 2023 Cardiac AVR, bioprosthetic 2014 Laparoscopic gastric sleeve resection 2009 Pacemaker placement 2014 Remainder reviewed in history tab.   Pertinent Social History: Tobacco use: Former smoker, 40 years x 2.5 PPD, quit ~4 years ago Alcohol use: None Other Substance use: None [facility], but has was at a SNF for a couple weeks  Pertinent Family History: Lung cancer in mother Skin cancer in brother Father with cardiac issues  Remainder reviewed in history tab.   Important Outpatient Medications: Alendronate 70 mg every Sunday Atorvastatin  40 mg daily Symbicort 2 puffs BID Carvedilol  3.125 mg BID Eliquis  5 mg twice daily Fluoxetine  40 mg daily Gabapentin  600 mg QHS Guanfacine 600 mg BID Hydralazine  25 mg q8h Ipratropium 2 sprays into nostrils TID Iron 325 mg daily Isosorbide  mononitrate 15 mg daily Lantus  6 units at bedtime Levocetirizine 5 mg QHS Levothyroxine  75 mcg daily Magnesium  Oxide 400 mg daily Pantoprazole  40 mg BID > daily after 1/15 ProAir  RespiClick QID PRN Sucralfate  1g QID Torsemide  60 mg BID Trazodone  25 mg QHS Remainder reviewed in medication history.   Objective: BP (!) 130/43   Pulse 60   Temp 97.9 F (36.6 C) (Oral)   Resp 18   SpO2 96%  Exam: General: Awake and alert.  In NAD.  Laying comfortably in  bed. HEENT: NCAT.  EOMI. Scleral anicteric. Cardiovascular: RRR. Prosthetic valve systolic murmur.  No R/G. Respiratory: Diffuse crackles.  Normal WOB on 3.5 L O2.  No wheezing or rhonchi.  Unable to auscultate posteriorly d/t pt having difficulty repositioning Gastrointestinal: Soft, nontender, nondistended.  Normoactive bowel sounds MSK: Legs wrapped. Chronic b/l venous stasis/discoloration in BLE. No significant edema Derm: Warm and dry. Neuro: AAOx3.  No focal neurological deficits.  Labs:  CBC BMET   Recent Labs  Lab 05/20/23 1459  WBC 10.3  HGB 9.4*  HCT 33.9*  PLT 284   Recent Labs  Lab 05/20/23 1459  NA 144  K 3.3*  CL 101  CO2 29  BUN 38*  CREATININE 1.30*  GLUCOSE 125*  CALCIUM  8.5*     Pertinent additional labs: Troponin: 48 and 44 BNP pending  EKG: My own interpretation - paced rhythm, regular rate, regular rhythm, no ST elevation, RBBB  Imaging Studies Performed:  CXR Diffuse bilateral patchy opacities, R>L, which may represent pulmonary edema or multifocal infection.   Trace blunting of the costophrenic angles, which may represent trace pleural effusions.  Recently reviewed imaging and agree with radiologist impression.  Janna Ferrier, DO 05/20/2023, 10:28 PM PGY-1, [facility]  FPTS Intern pager: [phone], text pages welcome Secure chat group [facility] Teaching Service   I was personally present and re-performed the exam and medical decision making and verified the service and findings are accurately documented in the resident's note.  Lauraine Molt, DO 05/20/2023 10:57 PM

## 2023-05-20 NOTE — Hospital Course (Addendum)
 Tonya Glass is a 78 y.o.female with a history of HFrEF, COPD, HLD, HTN, SSS, hypothyroidism, peripheral neuropathy, aortic stenosis s/p AVR who was admitted to the [facility] Teaching Service at [facility] for shortness of breath likely in the setting of multifocal pneumonia. Her hospital course is detailed below:  SOB  COPD Patient admitted with increased work of breathing on 4 L O2 (home requirement). Arrived afebrile without leukocytosis, BNP 580. Troponin trended flat 48 > 44. EKG without ischemic findings. Initial CXR consistent with prior imaging, though ED did give one-time CTX and azithromycin . Abx were not continued given low concern for PNA. Overall patient presentation most likely secondary to progressive chronic lung disease. Received scheduled duonebs during admission with improvement of work of breathing. Patient remained afebrile throughout admission and stable on her home O2 requirement. Patient discharged with Trelegy and duonebs as needed. Per patient's daughter, they have upcoming appointments with Palliative and Pulmonology scheduled outpatient.  Pacemaker complication  Patient's pacemaker reportedly oversensing oversensing for T wave and QRS wave per CCMD. Telemetry with V-pacing, pacer oversensing for T wave and QRS wave. Interrogated by [employer] rep who changed to DDDR 80 & extended AV delay. EP recommended outpatient follow up after discussion with rep.   Other chronic conditions were medically managed with home medications and formulary alternatives as necessary: HFrEF: Continue Coreg  3.125 mg BID and Torsemide  60 mg BID HTN: Continue Imdur  15 mg daily, hydralazine  25 mg Q8h Hypothyroidism: Continue levothyroxine  75 mcg daily Depression: Continue fluoxetine  40 mg daily, held trazodone  25 mg nightly (recommended for PCP to discontinue at discharge last admission) Peripheral neuropathy: Continue gabapentin  600 mg nightly Osteoporosis: Continue cholecalciferol  2000 IU daily.   Also on alendronate 70 mg every Sunday, hold inpatient. HLD: Continue home atorvastatin  40 mg daily Anemia: Hgb stable around baseline. Continued PPI and iron.  PCP Follow-up Recommendations: Continue nighttime CPAP. Consider sleep study. Follow up Pulmonology recommendations   Follow up goals of care discussion with palliative/hospice  Consider d/c of trazodone  Follow up with EP outpatient

## 2023-05-20 NOTE — Assessment & Plan Note (Signed)
 Chronic, baseline hemoglobin~9-10.  Did receive blood transfusions during last hospitalization in setting of GI bleed requiring cauterization for angiectasia.  No reports of blood in stool at this time.  CBC today indicates macrocytic anemia today. -CTM with CBC -Consider workup for macrocytosis if persists -Continue twice daily PPI -Continue iron supplement

## 2023-05-20 NOTE — ED Provider Triage Note (Signed)
 Emergency Medicine Provider Triage Evaluation Note  Tonya Glass , a 78 y.o. female  was evaluated in triage.  Pt complains of occasional shortness of breath, comes from a nursing facility, has a known pacemaker, history of a heart murmur, syncope, pacemaker was placed, [employer], open heart surgery back dating to over 10 years ago.  Prior history of stroke, hypertension, sick sinus syndrome, congestive heart failure, pulmonary hypertension, on Eliquis  insulin , levothyroxine  and torsemide .  Patient was found by paramedics to have intermittent runs of wide-complex heart rate, there was no tachycardia, they correlated the patient shortness of breath with these intermittent runs of wide-complex rhythms.  I have reviewed the prehospital strips and there are multiple different morphologies of QRSs however there does not appear to be significant arrhythmias present.  Review of Systems  Positive: Shortness of breath Negative: Chest pain fever vomiting swelling  Physical Exam  There were no vitals taken for this visit. Gen:   Awake, no distress answers questions, speaks in full sentences Resp:  Normal effort, lungs clear, no rales, no wheezing MSK:   Moves extremities without difficulty moves all 4 extremities, follows commands without difficulty Other:  No significant edema, awake and alert  Medical Decision Making  Medically screening exam initiated at 2:20 PM.  Appropriate orders placed.  Teagan Ozawa was informed that the remainder of the evaluation will be completed by another provider, this initial triage assessment does not replace that evaluation, and the importance of remaining in the ED until their evaluation is complete.  Abnormal appearance of EKG, shortness of breath, COVID is going around her facility, will check labs x-ray and cardiac monitoring.   Cleotilde Rogue, MD 05/20/23 [phone]

## 2023-05-20 NOTE — Assessment & Plan Note (Addendum)
 Differential is broad as listed above for this patient with recurrent admissions with shortness of breath and similar CXR findings previously. Has been previously worked up for autoimmune process (ANA +),  hypersensitivity pneumonitis, Legionella and Strep pneumo urine antigens which were negative. Pt was planning to f/u out pt w/ pulm w/in the next few weeks. Currently satting well on home O2. Will continue to monitor her symptoms.  - Admit to FMTS, attending Dr. Anders - Med-Tele, Vital signs per floor - Heart Healthy diet  - s/p CTX and Azithromycin  in the ED - AM BMP and procalcitonin to determine if continued antibiotics would be beneficial - Consider pulmonary consult in the AM - Considered giving a dose of IV Lasix , however per chart review she had a bump in her Cr with this in the past and was put back on her home Torsemide  dose during previous admission. Will provide evening dose of home Torsemide . -f/u bnp -O2 supplementation (home is 4 L), keep SpO2 88-92% given COPD

## 2023-05-20 NOTE — ED Provider Notes (Signed)
 4:11 PM Care assumed from Dr. Cleotilde.  At time of transfer care, patient is awaiting workup completion.  Due to the shortness of breath worsening with now evidence of multifocal pneumonia on x-ray, plan of care is to give antibiotics and admit for further management.  7:36 PM Workup continues to return.  She does not have COVID and her troponin seems stable elevated and improvement prior.  Will order antibiotics and admit for continued difficulty breathing in the setting of recurrent multifocal pneumonia and pulmonary edema.  She appears to be admitted to the family medicine service recently so we will call for readmission.  Clinical Impression: 1. SOB (shortness of breath)   2. Multifocal pneumonia     Disposition: Admit  This note was prepared with assistance of Dragon voice recognition software. Occasional wrong-word or sound-a-like substitutions may have occurred due to the inherent limitations of voice recognition software.     Charli Liberatore, Lonni PARAS, MD 05/20/23 2306

## 2023-05-20 NOTE — ED Provider Notes (Signed)
 [place] EMERGENCY DEPARTMENT AT [facility] Provider Note   CSN: [id number] Arrival date & time: 05/20/23  1415     History  Chief Complaint  Patient presents with   Shortness of Breath    Dashanna Kinnamon is a 78 y.o. female.   Shortness of Breath  This patient is a 78 year old female, she had been admitted to the hospital approximately 3 weeks ago with multifocal pneumonia, she was admitted 3 weeks before that because of shortness of breath, she has a known history of chronic right heart failure, cor pulmonale, venous insufficiency, aortic valve replacement, aortic root repair, sick sinus syndrome status post pacemaker, atypical atrial flutter and an acquired thrombophilia.  She has known diabetes on insulin , hypothyroidism and known chronic anemia.  She presents to the hospital today after having shortness of breath at her nursing facility.  She was found to have what appeared to be a possible arrhythmia that was described by the paramedics as a wide-complex rhythm, they report that they were seen both wide-complex and narrow complex on different strips and that she would often get short of breath associated with the wide-complex beats.  There was no hypoxia prehospital, there was no tachycardia prehospital, there has been no fevers though the patient does state in the nursing home agrees that there has been multiple cases of COVID-19 going around the nursing home.  The patient denies coughing other than the occasional cough, there is no fevers no vomiting no diarrhea and no significant swelling of the legs    Home Medications Prior to Admission medications   Medication Sig Start Date End Date Taking? Authorizing Provider  acetaminophen  (TYLENOL ) 325 MG tablet Take 650 mg by mouth every 8 (eight) hours as needed for mild pain (pain score 1-3).    [provider]  alendronate (FOSAMAX) 70 MG tablet Take 70 mg by mouth every Sunday. 07/13/17   [provider]   Amino Acids-Protein Hydrolys (FEEDING SUPPLEMENT, PRO-STAT SUGAR FREE 64,) LIQD Take 30 mLs by mouth 2 (two) times daily.    [provider]  apixaban  (ELIQUIS ) 5 MG TABS tablet Take 5 mg by mouth 2 (two) times daily.    [provider]  atorvastatin  (LIPITOR) 40 MG tablet Take 40 mg by mouth daily.    [provider]  Benzethonium Chloride 0.13 % LIQD Apply 1 Application topically 2 (two) times a week. On Tuesdays and Fridays    [provider]  budesonide -formoterol  (SYMBICORT) 160-4.5 MCG/ACT inhaler Inhale 2 puffs into the lungs 2 (two) times daily.    [provider]  carvedilol  (COREG ) 3.125 MG tablet Take 1 tablet (3.125 mg total) by mouth 2 (two) times daily. 06/23/22   Vicci Rollo SAUNDERS, PA-C  Cholecalciferol  50 MCG (2000 UT) TABS Take 1 tablet by mouth daily.    [provider]  Emollient (AQUAPHOR ADV THERAPY HEALING) 41 % OINT Apply 1 Application topically See admin instructions. Apply 1 application topically every shift, apply to sacrum wound.    [provider]  Ferrous Sulfate  (IRON) 325 (65 Fe) MG TABS Take 325 mg by mouth daily. 08/13/17   [provider]  FLUoxetine  (PROZAC ) 40 MG capsule Take 40 mg by mouth daily.     [provider]  gabapentin  (NEURONTIN ) 600 MG tablet Take 600 mg by mouth at bedtime. 04/24/23   [provider]  guaiFENesin  (MUCINEX ) 600 MG 12 hr tablet Take 600 mg by mouth 2 (two) times daily.    [provider]  HUMALOG KWIKPEN 100 UNIT/ML KwikPen Inject into the skin 2 (two) times daily. Sliding scale 03/11/23   [provider]  hydrALAZINE  (APRESOLINE ) 25 MG tablet Take 1 tablet (25 mg total) by mouth every 8 (eight) hours. 04/23/23   Theophilus Pagan, MD  ipratropium (ATROVENT ) 0.06 % nasal spray Place 2 sprays into both nostrils 3 (three) times daily. 12/14/21   Desai, Nikita S, MD  isosorbide  mononitrate (IMDUR ) 30 MG 24 hr tablet Take 0.5 tablets (15  mg total) by mouth daily. 04/23/23   Theophilus Pagan, MD  LANTUS  SOLOSTAR 100 UNIT/ML Solostar Pen Inject 6 Units into the skin at bedtime. 04/23/23   Theophilus Pagan, MD  levocetirizine (XYZAL) 5 MG tablet Take 5 mg by mouth at bedtime. 09/05/21   [provider]  levothyroxine  (SYNTHROID ) 75 MCG tablet Take 75 mcg by mouth daily. 03/22/23   [provider]  magnesium  oxide (MAG-OX) 400 (240 Mg) MG tablet Take 400 mg by mouth daily.    [provider]  [facility] powder Apply 1 Application topically 2 (two) times daily. 04/24/23   [provider]  nystatin ointment (MYCOSTATIN) Apply 1 application  topically daily as needed (rash). 100,000- units/gram    [provider]  pantoprazole  (PROTONIX ) 40 MG tablet Take 1 tablet (40 mg total) by mouth 2 (two) times daily before a meal. 05/05/23   Baloch, Mahnoor, MD  PROAIR  RESPICLICK 108 (90 Base) MCG/ACT AEPB Inhale 2 puffs into the lungs 4 (four) times daily as needed. 02/19/23   [provider]  sodium chloride  (OCEAN) 0.65 % SOLN nasal spray Place 2 sprays into both nostrils every 6 (six) hours as needed for congestion.    [provider]  sucralfate  (CARAFATE ) 1 g tablet Take 1 g by mouth 4 (four) times daily -  with meals and at bedtime.    [provider]  torsemide  (DEMADEX ) 20 MG tablet Take 60 mg by mouth 2 (two) times daily. 04/30/23   [provider]  traZODone  (DESYREL ) 50 MG tablet Take 0.5 tablets (25 mg total) by mouth at bedtime. 04/23/23   Theophilus Pagan, MD      Allergies    Patient has no known allergies.    Review of Systems   Review of Systems  Respiratory:  Positive for shortness of breath.   All other systems reviewed and are negative.   Physical Exam Updated Vital Signs There were no vitals taken for this visit. Physical Exam Vitals and nursing note reviewed.  Constitutional:      General: She is not in acute distress.    Appearance: She is  well-developed.  HENT:     Head: Normocephalic and atraumatic.     Mouth/Throat:     Pharynx: No oropharyngeal exudate.  Eyes:     General: No scleral icterus.       Right eye: No discharge.        Left eye: No discharge.     Conjunctiva/sclera: Conjunctivae normal.     Pupils: Pupils are equal, round, and reactive to light.  Neck:     Thyroid: No thyromegaly.     Vascular: No JVD.  Cardiovascular:     Rate and Rhythm: Normal rate and regular rhythm.     Heart sounds: Normal heart sounds. No murmur heard.    No friction rub. No gallop.  Pulmonary:     Effort: Pulmonary effort is normal. No respiratory distress.     Breath sounds: Normal breath sounds.  No wheezing or rales.  Abdominal:     General: Bowel sounds are normal. There is no distension.     Palpations: Abdomen is soft. There is no mass.     Tenderness: There is no abdominal tenderness.  Musculoskeletal:        General: No tenderness. Normal range of motion.     Cervical back: Normal range of motion and neck supple.     Right lower leg: No tenderness.     Left lower leg: No tenderness.  Lymphadenopathy:     Cervical: No cervical adenopathy.  Skin:    General: Skin is warm and dry.     Findings: No erythema or rash.  Neurological:     Mental Status: She is alert.     Coordination: Coordination normal.  Psychiatric:        Behavior: Behavior normal.     ED Results / Procedures / Treatments   Labs (all labs ordered are listed, but only abnormal results are displayed) Labs Reviewed  SARS CORONAVIRUS 2 BY RT PCR  CBC WITH DIFFERENTIAL/PLATELET  COMPREHENSIVE METABOLIC PANEL  MAGNESIUM   TROPONIN I (HIGH SENSITIVITY)    EKG None  Radiology No results found.  Procedures Procedures    Medications Ordered in ED Medications - No data to display  ED Course/ Medical Decision Making/ A&P                                 Medical Decision Making Amount and/or Complexity of Data Reviewed Labs:  ordered. Radiology: ordered. ECG/medicine tests: ordered.  Risk Decision regarding hospitalization.    This patient presents to the ED for concern of shortness of breath, this involves an extensive number of treatment options, and is a complaint that carries with it a high risk of complications and morbidity.  The differential diagnosis includes pneumonia, pneumothorax, COVID-19, heart failure, arrhythmia, ischemia   Co morbidities that complicate the patient evaluation  As listed above in the history of present illness multiple comorbidities   Additional history obtained:  Additional history obtained from the medical record External records from outside source obtained and reviewed including right heart catheterization from December 4, showed normal right heart pressures Echocardiogram from November 28 ejection fraction of 35 to 40% Pacemaker remote device check from December 19 showed that the patient was not pacemaker dependent at the last check in the office but then had 100% ventricular pacing due to very long AV conduction times given her history of second-degree AV block, battery was good   Lab Tests:  I Ordered, and personally interpreted labs.  The pertinent results include: Glucose slightly elevated, metabolic panel with chronic renal insufficiency, CBC without significant leukocytosis, troponin slightly elevated but chronically elevated negative for COVID   Imaging Studies ordered:  I ordered imaging studies including chest x-ray I independently visualized and interpreted imaging which showed diffuse bilateral patchy opacities consistent with multifocal pneumonia I agree with the radiologist interpretation   Cardiac Monitoring: / EKG:  The patient was maintained on a cardiac monitor.  I personally viewed and interpreted the cardiac monitored which showed an underlying rhythm of: Afebrile, normotensive, no tachycardia   Consultations Obtained:  Case discussed  with oncoming emergency department physician who will admit patient to hospital service  why or why not admission, treatments were needed:1} Final Clinical Impression(s) / ED Diagnoses Final diagnoses:  None    Rx / DC Orders ED Discharge Orders  None         Cleotilde Rogue, MD 05/21/23 AVA

## 2023-05-20 NOTE — ED Triage Notes (Signed)
 PT BIB EMS from facility with SOB x 3 days. Denies any pain. Pt typically on 3l/[place] and had increased demands today. Complains of productive cough which is baseline for pt. EMS states EKG changes enroute.

## 2023-05-21 ENCOUNTER — Encounter (HOSPITAL_COMMUNITY): Payer: Self-pay | Admitting: Family Medicine

## 2023-05-21 LAB — CBC
HCT: 32 % — ABNORMAL LOW (ref 36.0–46.0)
Hemoglobin: 9.1 g/dL — ABNORMAL LOW (ref 12.0–15.0)
MCH: 29.3 pg (ref 26.0–34.0)
MCHC: 28.4 g/dL — ABNORMAL LOW (ref 30.0–36.0)
MCV: 102.9 fL — ABNORMAL HIGH (ref 80.0–100.0)
Platelets: 284 10*3/uL (ref 150–400)
RBC: 3.11 MIL/uL — ABNORMAL LOW (ref 3.87–5.11)
RDW: 16 % — ABNORMAL HIGH (ref 11.5–15.5)
WBC: 10.4 10*3/uL (ref 4.0–10.5)
nRBC: 0 % (ref 0.0–0.2)

## 2023-05-21 LAB — BRAIN NATRIURETIC PEPTIDE: B Natriuretic Peptide: 580.7 pg/mL — ABNORMAL HIGH (ref 0.0–100.0)

## 2023-05-21 LAB — CBG MONITORING, ED
Glucose-Capillary: 113 mg/dL — ABNORMAL HIGH (ref 70–99)
Glucose-Capillary: 107 mg/dL — ABNORMAL HIGH (ref 70–99)

## 2023-05-21 LAB — BASIC METABOLIC PANEL
Anion gap: 15 (ref 5–15)
BUN: 35 mg/dL — ABNORMAL HIGH (ref 8–23)
CO2: 28 mmol/L (ref 22–32)
Calcium: 8.5 mg/dL — ABNORMAL LOW (ref 8.9–10.3)
Chloride: 104 mmol/L (ref 98–111)
Creatinine, Ser: 1.37 mg/dL — ABNORMAL HIGH (ref 0.44–1.00)
GFR, Estimated: 40 mL/min — ABNORMAL LOW (ref >60)
Glucose, Bld: 129 mg/dL — ABNORMAL HIGH (ref 70–99)
Potassium: 3.3 mmol/L — ABNORMAL LOW (ref 3.5–5.1)
Sodium: 147 mmol/L — ABNORMAL HIGH (ref 135–145)

## 2023-05-21 LAB — GLUCOSE, CAPILLARY
Glucose-Capillary: 117 mg/dL — ABNORMAL HIGH (ref 70–99)
Glucose-Capillary: 120 mg/dL — ABNORMAL HIGH (ref 70–99)
Glucose-Capillary: 119 mg/dL — ABNORMAL HIGH (ref 70–99)

## 2023-05-21 LAB — MRSA NEXT GEN BY PCR, NASAL: MRSA by PCR Next Gen: DETECTED — AB

## 2023-05-21 LAB — PROCALCITONIN: Procalcitonin: <0.1 ng/mL

## 2023-05-21 MED ORDER — POTASSIUM CHLORIDE 20 MEQ PO PACK: 40.0000 meq | PACK | Freq: Two times a day (BID) | ORAL | Status: DC

## 2023-05-21 MED ORDER — IPRATROPIUM BROMIDE 0.06 % NA SOLN
2.0000 | Freq: Three times a day (TID) | NASAL | Status: DC
  Administered 2023-05-21 – 2023-05-23 (×7): 2 via NASAL
  Filled 2023-05-21 (×2): qty 15

## 2023-05-21 MED ORDER — MOMETASONE FURO-FORMOTEROL FUM 200-5 MCG/ACT IN AERO
2.0000 | INHALATION_SPRAY | Freq: Two times a day (BID) | RESPIRATORY_TRACT | Status: DC
  Administered 2023-05-21 – 2023-05-23 (×4): 2 via RESPIRATORY_TRACT
  Filled 2023-05-21 (×2): qty 8.8

## 2023-05-21 MED ORDER — POTASSIUM CHLORIDE 20 MEQ PO PACK
40.0000 meq | PACK | Freq: Once | ORAL | Status: AC
  Administered 2023-05-21: 40 meq via ORAL
  Filled 2023-05-21: qty 2

## 2023-05-21 MED ORDER — IPRATROPIUM-ALBUTEROL 0.5-2.5 (3) MG/3ML IN SOLN
3.0000 mL | RESPIRATORY_TRACT | Status: DC
  Administered 2023-05-21 – 2023-05-22 (×5): 3 mL via RESPIRATORY_TRACT
  Filled 2023-05-21 (×5): qty 3

## 2023-05-21 MED ORDER — MUPIROCIN 2 % EX OINT
1.0000 | TOPICAL_OINTMENT | Freq: Two times a day (BID) | CUTANEOUS | Status: DC
  Administered 2023-05-21 – 2023-05-23 (×5): 1 via NASAL
  Filled 2023-05-21: qty 22

## 2023-05-21 MED ORDER — IPRATROPIUM-ALBUTEROL 0.5-2.5 (3) MG/3ML IN SOLN
3.0000 mL | Freq: Four times a day (QID) | RESPIRATORY_TRACT | Status: DC
  Administered 2023-05-21: 3 mL via RESPIRATORY_TRACT
  Filled 2023-05-21: qty 3

## 2023-05-21 MED ORDER — ALBUTEROL SULFATE (2.5 MG/3ML) 0.083% IN NEBU
2.5000 mg | INHALATION_SOLUTION | RESPIRATORY_TRACT | Status: DC | PRN
  Administered 2023-05-21: 2.5 mg via RESPIRATORY_TRACT
  Filled 2023-05-21: qty 3

## 2023-05-21 MED ORDER — CHLORHEXIDINE GLUCONATE CLOTH 2 % EX PADS
6.0000 | MEDICATED_PAD | Freq: Every day | CUTANEOUS | Status: DC
  Administered 2023-05-22 – 2023-05-23 (×2): 6 via TOPICAL

## 2023-05-21 NOTE — Care Management Obs Status (Cosign Needed)
 MEDICARE OBSERVATION STATUS NOTIFICATION   Patient Details  Name: Tonya Glass MRN: [id number] Date of Birth: April 27, 1946   Medicare Observation Status Notification Given:  Yes    Janae Bridgeman, RN 05/21/2023, 3:50 PM

## 2023-05-21 NOTE — ED Notes (Signed)
 Pt turned to her right side with pillow support under left hip.

## 2023-05-21 NOTE — Plan of Care (Signed)

## 2023-05-21 NOTE — NC FL2 (Signed)
 [place]  MEDICAID FL2 LEVEL OF CARE FORM     IDENTIFICATION  Patient Name: Tonya Glass Birthdate: Jul 15, 1945 Sex: female Admission Date (Current Location): 05/20/2023  [facility] and [place] Number:  Producer, Television/film/video and Address:  [facility] [place], [address], [place], [place] [address]      Provider Number: [id number]  Attending Physician Name and Address:  Jeanelle Layman CROME, MD  Relative Name and Phone Number:  Landry Piety, daughter - [phone]    Current Level of Care: Hospital Recommended Level of Care: Skilled Nursing Facility Prior Approval Number:    Date Approved/Denied:   PASRR Number: [id number]  Discharge Plan: SNF    Current Diagnoses: Patient Active Problem List   Diagnosis Date Noted   Shortness of breath 05/20/2023   Acute on chronic combined systolic and diastolic CHF (congestive heart failure) (HCC) 05/05/2023   Hematoma of right flank 05/03/2023   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 05/03/2023   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   Closed compression fracture of second lumbar vertebra (HCC) 05/02/2023   Gastrointestinal hemorrhage with melena 05/02/2023   Sacral wound 05/02/2023   Symptomatic anemia 05/01/2023   HFrEF (heart failure with reduced ejection fraction) (HCC) 05/01/2023   Lumbar compression fracture (HCC) 05/01/2023   Heme positive stool 05/01/2023   SOB (shortness of breath) 04/18/2023   Pulmonary hypertension, unspecified (HCC) 04/15/2023   Concern for multidrug resistant pneumonia 04/15/2023   Acute systolic heart failure (HCC) 04/14/2023   Acute kidney injury 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) 04/11/2023   Anemia 04/11/2023   COPD (chronic obstructive pulmonary disease) (HCC) 04/11/2023   GERD (gastroesophageal reflux disease) 04/11/2023   Acute exacerbation of CHF (congestive heart failure) (HCC) 04/10/2023   Pressure injury of skin 06/23/2022   Cor  pulmonale, chronic (HCC) 09/12/2021   PAH (pulmonary artery hypertension) (HCC) 09/12/2021   Peripheral venous insufficiency 09/12/2021   Stage 3b chronic kidney disease (HCC) 09/12/2021   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   Paravalvular leak (prosthetic valve) 08/07/2021   Nonrheumatic mitral valve regurgitation    Venous stasis ulcers of both lower extremities (HCC) 04/13/2021   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   Thoracic aortic aneurysm without rupture (HCC) 03/19/2018   Nonrheumatic aortic (valve) insufficiency 03/19/2018   Acute CHF (congestive heart failure) (HCC) 08/18/2017   Acute diastolic CHF (congestive heart failure) (HCC) 08/18/2017   CHF (congestive heart failure) (HCC) 08/18/2017   Hypothyroidism 07/23/2017   Acute on chronic hypoxic respiratory failure (HCC) 07/23/2017   Aortic valve stenosis 10/10/2016   Diastolic murmur 08/02/2016   SSS (sick sinus syndrome) (HCC) 03/20/2016   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   Atherosclerosis of native coronary artery of native heart without angina pectoris 09/06/2014   Pacemaker 05/28/2013   Essential hypertension 05/28/2013   Dyslipidemia 05/28/2013   S/P aortic valve replacement with bioprosthetic valve 05/28/2013   Chronic heart failure (HCC) 05/28/2013    Orientation RESPIRATION BLADDER Height & Weight     Self, Time, Situation, Place  O2 (chronic oxygen at the facility) External catheter Weight: 90.7 kg Height:  5' (152.4 cm)  BEHAVIORAL SYMPTOMS/MOOD NEUROLOGICAL BOWEL NUTRITION STATUS      Incontinent Diet (see discharge summary)  AMBULATORY STATUS COMMUNICATION OF NEEDS Skin   Total Care Verbally Other (Comment) (L/R stage 2 buttocks, Right leg partial thickness wound)  Personal Care Assistance Level of Assistance  Bathing, Feeding, Dressing Bathing Assistance: Maximum assistance Feeding  assistance: Limited assistance Dressing Assistance: Maximum assistance     Functional Limitations Info  Sight, Hearing, Speech Sight Info: Adequate Hearing Info: Adequate Speech Info: Adequate    SPECIAL CARE FACTORS FREQUENCY  PT (By licensed PT), OT (By licensed OT)     PT Frequency: 3-5 x per week OT Frequency: 3-5 x per week            Contractures Contractures Info: Not present    Additional Factors Info  Code Status, Allergies, Psychotropic, Insulin  Sliding Scale Code Status Info: DNR Allergies Info: NKDA Psychotropic Info: Prozac , neurontin  Insulin  Sliding Scale Info: Novolog  sliding scale insulin - sensitive scale, Semglee        Current Medications (05/21/2023):  This is the current hospital active medication list Current Facility-Administered Medications  Medication Dose Route Frequency Provider Last Rate Last Admin   acetaminophen  (TYLENOL ) tablet 650 mg  650 mg Oral Q8H PRN Gomes, Adriana, DO       albuterol  (PROVENTIL ) (2.5 MG/3ML) 0.083% nebulizer solution 2.5 mg  2.5 mg Nebulization Q2H PRN Joshua Domino, DO       apixaban  (ELIQUIS ) tablet 5 mg  5 mg Oral BID Gomes, Adriana, DO   5 mg at 05/21/23 1046   atorvastatin  (LIPITOR) tablet 40 mg  40 mg Oral Daily Gomes, Adriana, DO   40 mg at 05/21/23 1045   carvedilol  (COREG ) tablet 3.125 mg  3.125 mg Oral BID Gomes, Adriana, DO   3.125 mg at 05/20/23 2227   cholecalciferol  (VITAMIN D3) 25 MCG (1000 UNIT) tablet 2,000 Units  2,000 Units Oral Daily Janna Ferrier, DO   2,000 Units at 05/21/23 1046   ferrous sulfate  tablet 325 mg  325 mg Oral Daily Gomes, Adriana, DO   325 mg at 05/21/23 1046   FLUoxetine  (PROZAC ) capsule 40 mg  40 mg Oral Daily Gomes, Adriana, DO   40 mg at 05/21/23 1047   gabapentin  (NEURONTIN ) capsule 600 mg  600 mg Oral QHS Gomes, Adriana, DO   600 mg at 05/20/23 2227   hydrALAZINE  (APRESOLINE ) tablet 25 mg  25 mg Oral Q8H Gomes, Adriana, DO   25 mg at 05/21/23 1609   insulin  aspart (novoLOG )  injection 0-9 Units  0-9 Units Subcutaneous TID WC Gomes, Adriana, DO       insulin  glargine-yfgn (SEMGLEE ) injection 6 Units  6 Units Subcutaneous QHS Gomes, Adriana, DO   6 Units at 05/20/23 2317   ipratropium (ATROVENT ) 0.06 % nasal spray 2 spray  2 spray Each Nare TID Anders Otto DASEN, MD       ipratropium-albuterol  (DUONEB) 0.5-2.5 (3) MG/3ML nebulizer solution 3 mL  3 mL Nebulization Q4H Diona Perkins, MD   3 mL at 05/21/23 1541   isosorbide  mononitrate (IMDUR ) 24 hr tablet 15 mg  15 mg Oral Daily Gomes, Adriana, DO   15 mg at 05/21/23 1046   levothyroxine  (SYNTHROID ) tablet 75 mcg  75 mcg Oral Q0600 Gomes, Adriana, DO   75 mcg at 05/21/23 0630   loratadine  (CLARITIN ) tablet 10 mg  10 mg Oral QHS Gomes, Adriana, DO   10 mg at 05/20/23 2228   mometasone -formoterol  (DULERA ) 200-5 MCG/ACT inhaler 2 puff  2 puff Inhalation BID Anders Otto DASEN, MD       pantoprazole  (PROTONIX ) EC tablet 40 mg  40 mg Oral BID AC Gomes, Adriana, DO   40 mg at 05/21/23 0736   sucralfate  (CARAFATE ) tablet 1 g  1 g Oral TID WC & HS Gomes, Adriana, DO   1 g at 05/21/23 1608   terbinafine  (LAMISIL ) tablet 250 mg  250 mg Oral Daily Gomes, Adriana, DO       torsemide  (DEMADEX ) tablet 60 mg  60 mg Oral BID Gomes, Adriana, DO   60 mg at 05/21/23 9263     Discharge Medications: Please see discharge summary for a list of discharge medications.  Relevant Imaging Results:  Relevant Lab Results:   Additional Information SS# [id number], [place]

## 2023-05-21 NOTE — Evaluation (Signed)
 Physical Therapy Evaluation & Discharge Patient Details Name: Tonya Glass MRN: [id number] DOB: 06-08-1945 Today's Date: 05/21/2023  History of Present Illness  78 y.o. female admitted from [facility] on 05/20/23 with SOB; etiology unknown. Of note, 2x recent admissions 04/2023, including incidental finding chronic L2 vertebral body fx (no brace needed). Other PMH includes COPD (3-4L O2 baseline per pt), HFrEF, CKD, pacemaker, stroke, DM2, HTN, chronic LE venous stasis, anxiety, depression.   Clinical Impression  Patient evaluated by Physical Therapy with no further acute PT needs identified. PTA, pt reports resident at [facility], not currently working with therapies there; pt states she does not stand, staff assists with bed-level ADLs and OOB transfers using hoyer lift. Today, pt requiring mod-maxA for rolling and repositioning for pressure relief; pt with urine incontinence, dependent for pericare. Notable wounds to perineum/buttocks; RN notified of need for air mattress if pt to remain admitted. Pt states not interested in working on sitting or OOB mobility with PT while admitted; will sign off. All education has been completed and the patient has no further questions.     SpO2 >/93% on 4L O2 [place]     If plan is discharge home, recommend the following: Two people to help with walking and/or transfers;Two people to help with bathing/dressing/bathroom;Assistance with cooking/housework;Assist for transportation   Can travel by private vehicle   No    Equipment Recommendations None recommended by PT  Recommendations for Other Services       Functional Status Assessment       Precautions / Restrictions Precautions Precautions: Fall;Other (comment) Precaution Comments: incontinence; sacral/buttocks and abdominal panus wounds; chronic 3-4L O2 [place] per pt Restrictions Weight Bearing Restrictions Per Provider Order: No Other Position/Activity Restrictions: pt states 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 pt declined attempt to sit EOB or lift transfer OOB secondary to back/buttocks/sacral pain    Transfers                   General transfer comment: pt declined. will require maximove lift OOB    Ambulation/Gait                  Stairs            Wheelchair Mobility     Tilt Bed    Modified Rankin (Stroke Patients Only)       Balance                                             Pertinent Vitals/Pain Pain Assessment Pain Assessment: Faces Faces Pain Scale: Hurts even more Pain Location: lower back with rolling and buttocks with pericare Pain Descriptors / Indicators: Discomfort, Grimacing, Guarding Pain Intervention(s): Monitored during session, Limited activity within patient's tolerance, Repositioned, Other (comment) (partial roll towards R-side to offload buttocks/sacral wounds)    Home Living Family/patient expects to be discharged to:: Skilled nursing facility  Additional Comments: not currently working with therapies    Prior Function Prior Level of Function : Needs assist       Physical Assist : Mobility (physical);ADLs (physical)     Mobility Comments: reports primarily bedbound; staff uses hoyer lift for OOB to lift chair or w/c (does not do this daily). pt does not like stedy because it hurts her knees; states she hasn't stood in a long time and she cannot stand due to neuropathy in feet. enjoys playing bing ADLs Comments: bed-level ADLs with  assist from staff; pt states she typically voids then is cleaned up bed-level. meals brought to pt     Extremity/Trunk Assessment   Upper Extremity Assessment Upper Extremity Assessment: RUE deficits/detail;LUE deficits/detail RUE Deficits / Details: difficulty reaching overhead with RUE secondary to pain; otherwise functional observed strength >/ 3/5 LUE Deficits / Details: functional observed strength >/ 3/5    Lower Extremity Assessment Lower Extremity Assessment: RLE deficits/detail;LLE deficits/detail RLE Deficits / Details: bed-level gross hip strength </ 3/5, knee flex/ext 3/5, ankle 3/5 LLE Deficits / Details: bed-level gross hip strength </ 3/5, knee flex/ext 3/5, ankle 3/5       Communication      Cognition Arousal: Alert Behavior During Therapy: WFL for tasks assessed/performed, Flat affect Overall Cognitive Status: No family/caregiver present to determine baseline cognitive functioning                                 General Comments: follows simple command and answering questions appropriately        General Comments General comments (skin integrity, edema, etc.): pt aware she was wet with urine incontinence, dependent for bed-level pericare and washup. notable buttocks/sacral wounds; pt left in partial R-sidelying for pressure relief as well as heels elevated from bed surface. pt c/o feeling like she can't breathe after each bout of bed-level mobility, SpO2 >/93% on 4L O2 [place]; cues for pursed lip breathing. educ re: POC, activity recommendations, mobility OOB with nursing staff using lift equipment, discharge needs. pt reports not interested in working on EOB or OOB mobility while admitted, in agreement with acute PT signing off and pt does not plan to work with PT at [employer] [facility] (reports not currently working with them)    Exercises Other Exercises Other Exercises: BLE heel slides and ankle pumps   Assessment/Plan    PT Assessment Patient does not  need any further PT services  PT Problem List         PT Treatment Interventions DME instruction;Therapeutic activities;Gait training;Therapeutic exercise;Stair training;Balance training;Functional mobility training;Patient/family education    PT Goals (Current goals can be found in the Care Plan section)  Acute Rehab PT Goals Patient Stated Goal: return to [facility] PT Goal Formulation: With patient Time For Goal Achievement: 06/04/23 Potential to Achieve Goals: Fair    Frequency Min 1X/week     Co-evaluation               AM-PAC PT 6 Clicks Mobility  Outcome Measure Help needed turning from your back to your side while in a flat bed without using bedrails?: Total Help needed moving from lying on your back to sitting on the side of a flat bed without using bedrails?: Total Help needed moving to and from a bed to a chair (including a wheelchair)?: Total Help needed standing up from a chair using your arms (e.g., wheelchair or bedside chair)?: Total Help needed to  walk in hospital room?: Total Help needed climbing 3-5 steps with a railing? : Total 6 Click Score: 6    End of Session Equipment Utilized During Treatment: Oxygen Activity Tolerance: Patient limited by pain Patient left: in bed;with call bell/phone within reach;with bed alarm set Nurse Communication: Mobility status;Need for lift equipment PT Visit Diagnosis: Other abnormalities of gait and mobility (R26.89);Pain    Time: 8591-8567 PT Time Calculation (min) (ACUTE ONLY): 24 min   Charges:   PT Evaluation $PT Eval Moderate Complexity: 1 Mod PT Treatments $Therapeutic Activity: 8-22 mins PT General Charges $$ ACUTE PT VISIT: 1 Visit       Darice Almas, PT, DPT Acute Rehabilitation Services  Personal: Secure Chat Rehab Office: [phone]  Darice LITTIE Almas 05/21/2023, 3:24 PM

## 2023-05-21 NOTE — Assessment & Plan Note (Addendum)
 Differential is broad as listed above for this patient with recurrent admissions with shortness of breath and similar CXR findings previously. Has been previously worked up for autoimmune process (ANA +),  hypersensitivity pneumonitis, Legionella and Strep pneumo urine antigens which were negative. Pt was planning to f/u out pt w/ pulm w/in the next few weeks. BNP 580. Procal wnl.  Trop 48 > 44. Patient remains afebrile, satting well on home O2.  - Duonebs q4 scheduled, albuterol  nebs prn  - s/p CTX and Azithromycin  in the ED - no indication to continue at this time - continue home Torsemide  60 BID for HFrEF - O2 supplementation (home is 4 L), goal SpO2 88-92% given COPD  - Consider palliative as indicated - AM BMP - replete electrolytes as needed

## 2023-05-21 NOTE — ED Notes (Signed)
 ED TO INPATIENT HANDOFF REPORT  ED Nurse Name and Phone #: Bruno [phone]  S Name/Age/Gender Tonya Glass 78 y.o. female Room/Bed: 003C/003C  Code Status   Code Status: Limited: Do not attempt resuscitation (DNR) -DNR-LIMITED -Do Not Intubate/DNI   Home/SNF/Other Skilled nursing facility Patient oriented to: self, place, time, and situation Is this baseline? Yes   Triage Complete: Triage complete  Chief Complaint Shortness of breath [R06.02]  Triage Note PT BIB EMS from facility with SOB x 3 days. Denies any pain. Pt typically on 3l/[place] and had increased demands today. Complains of productive cough which is baseline for pt. EMS states EKG changes enroute.   Allergies No Known Allergies  Level of Care/Admitting Diagnosis ED Disposition     ED Disposition  Admit   Condition  --   Comment  Hospital Area:  [facility] [[id number]]  Level of Care: Telemetry Medical [104]  May place patient in observation at [facility] or [facility] if equivalent level of care is available:: No  Covid Evaluation: Confirmed COVID Negative  Diagnosis: Shortness of breath [786.05.ICD-9-CM]  Admitting Physician: GOMES, ADRIANA [[id number]]  Attending Physician: ANDERS OTTO ONEIDA BRENDALYN          B Medical/Surgery History Past Medical History:  Diagnosis Date   Anemia    Anxiety    Aortic valve stenosis 10/10/2016   Arthritis    fingers (07/24/2017)   Chronic HFrEF (heart failure with reduced ejection fraction) (HCC)    CKD (chronic kidney disease)    Constipation 04/15/2023   COPD (chronic obstructive pulmonary disease) (HCC)    Depression    Gallstones    Heart murmur    High cholesterol    History of kidney stones    Hypertension    Hypothyroidism    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   Migraines    none in years til 01/2013-02/2013; none since  (07/24/2017)   Orthopnoea    just since open heart surgery (02/25/2013)   Pacemaker    [employer]     Pneumonia 07/23/2017   Stroke (HCC) 2001   denies residual on 02/25/2013   Symptomatic hypotension 10/10/2016   Syncope and collapse    prior to open heart surgery (02/25/2013)   Type II diabetes mellitus (HCC)    Past Surgical History:  Procedure Laterality Date   AORTIC ARCH ANGIOGRAPHY N/A 08/07/2021   Procedure: AORTIC ARCH ANGIOGRAPHY;  Surgeon: Wonda Sharper, MD;  Location: [facility] INVASIVE CV LAB;  Service: Cardiovascular;  Laterality: N/A;   BREAST SURGERY Right    clamped blood vessel and stitched it up; in [place]   CARDIAC CATHETERIZATION     CARDIAC VALVE REPLACEMENT  01/26/2013   cow valve put in; widened the second valve (02/25/2013) - [place], [place]   CATARACT EXTRACTION W/ INTRAOCULAR LENS  IMPLANT, BILATERAL Bilateral 04/2017   CHOLECYSTECTOMY OPEN  1971   CYST EXCISION     taken off my chest   ESOPHAGOGASTRODUODENOSCOPY (EGD) WITH PROPOFOL  N/A 05/03/2023   Procedure: ESOPHAGOGASTRODUODENOSCOPY (EGD) WITH PROPOFOL ;  Surgeon: Albertus Gordy HERO, MD;  Location: [facility] ENDOSCOPY;  Service: Gastroenterology;  Laterality: N/A;   HOT HEMOSTASIS N/A 05/03/2023   Procedure: HOT HEMOSTASIS (ARGON PLASMA COAGULATION/BICAP);  Surgeon: Albertus Gordy HERO, MD;  Location: [facility] ENDOSCOPY;  Service: Gastroenterology;  Laterality: N/A;   INSERT / REPLACE / REMOVE PACEMAKER  01/19/2013   [employer] - dual chamber   LAPAROSCOPIC GASTRIC SLEEVE RESECTION  2009   in [place]   PPM GENERATOR CHANGEOUT N/A 06/22/2022  Procedure: PPM GENERATOR CHANGEOUT;  Surgeon: Fernande Elspeth BROCKS, MD;  Location: [facility] INVASIVE CV LAB;  Service: Cardiovascular;  Laterality: N/A;   RIGHT HEART CATH N/A 04/17/2023   Procedure: RIGHT HEART CATH;  Surgeon: Verlin Lonni BIRCH, MD;  Location: [facility] INVASIVE CV LAB;  Service: Cardiovascular;  Laterality: N/A;   RIGHT/LEFT HEART CATH AND CORONARY ANGIOGRAPHY N/A 08/07/2021   Procedure: RIGHT/LEFT HEART CATH AND CORONARY ANGIOGRAPHY;  Surgeon: Wonda Sharper, MD;  Location: [facility]  INVASIVE CV LAB;  Service: Cardiovascular;  Laterality: N/A;   TEE WITHOUT CARDIOVERSION N/A 06/13/2021   Procedure: TRANSESOPHAGEAL ECHOCARDIOGRAM (TEE);  Surgeon: Francyne Headland, MD;  Location: [facility] ENDOSCOPY;  Service: Cardiovascular;  Laterality: N/A;   VEIN SURGERY Left    LLE; cut leg open and took out piece of vein that was protruding thru skin; stitched it up; done in [place]     A IV Location/Drains/Wounds Patient Lines/Drains/Airways Status     Active Line/Drains/Airways     Name Placement date Placement time Site Days   Peripheral IV 05/20/23 20 G Anterior;Left Forearm 05/20/23  1459  Forearm  1   Pressure Injury 05/01/23 Buttocks Left;Right Stage 2 -  Partial thickness loss of dermis presenting as a shallow open injury with a red, pink wound bed without slough. large area of non-blachable redness with MASD wound that is open 05/01/23  1930  -- 20   Wound / Incision (Open or Dehisced) 06/21/22 Irritant Dermatitis (Moisture Associated Skin Damage) Groin Anterior;Bilateral 06/21/22  2100  Groin  334   Wound / Incision (Open or Dehisced) 04/12/23 Other (Comment) Leg Right partial thickness wound 04/12/23  --  Leg  39   Wound / Incision (Open or Dehisced) 04/17/23 Irritant Dermatitis (Moisture Associated Skin Damage) Buttocks Right;Left 04/17/23  1330  Buttocks  34   Wound / Incision (Open or Dehisced) 05/01/23 Buttocks Mid;Left Moisture associated 05/01/23  1845  Buttocks  20            Intake/Output Last 24 hours No intake or output data in the 24 hours ending 05/21/23 1206  Labs/Imaging Results for orders placed or performed during the hospital encounter of 05/20/23 (from the past 48 hours)  Brain natriuretic peptide     Status: Abnormal   Collection Time: 05/20/23  2:58 PM  Result Value Ref Range   B Natriuretic Peptide 580.7 (H) 0.0 - 100.0 pg/mL    Comment: Performed at [facility] Lab, [address] [place], [place] [address]  CBC with Differential     Status:  Abnormal   Collection Time: 05/20/23  2:59 PM  Result Value Ref Range   WBC 10.3 4.0 - 10.5 K/uL   RBC 3.20 (L) 3.87 - 5.11 MIL/uL   Hemoglobin 9.4 (L) 12.0 - 15.0 g/dL   HCT 66.0 (L) 63.9 - 53.9 %   MCV 105.9 (H) 80.0 - 100.0 fL   MCH 29.4 26.0 - 34.0 pg   MCHC 27.7 (L) 30.0 - 36.0 g/dL   RDW 84.0 (H) 88.4 - 84.4 %   Platelets 284 150 - 400 K/uL   nRBC 0.2 0.0 - 0.2 %   Neutrophils Relative % 74 %   Neutro Abs 7.7 1.7 - 7.7 K/uL   Lymphocytes Relative 13 %   Lymphs Abs 1.3 0.7 - 4.0 K/uL   Monocytes Relative 8 %   Monocytes Absolute 0.8 0.1 - 1.0 K/uL   Eosinophils Relative 3 %   Eosinophils Absolute 0.3 0.0 - 0.5 K/uL   Basophils Relative  1 %   Basophils Absolute 0.1 0.0 - 0.1 K/uL   Immature Granulocytes 1 %   Abs Immature Granulocytes 0.13 (H) 0.00 - 0.07 K/uL    Comment: Performed at [facility] Lab, [address] [place], [place] [address]  Comprehensive metabolic panel     Status: Abnormal   Collection Time: 05/20/23  2:59 PM  Result Value Ref Range   Sodium 144 135 - 145 mmol/L   Potassium 3.3 (L) 3.5 - 5.1 mmol/L   Chloride 101 98 - 111 mmol/L   CO2 29 22 - 32 mmol/L   Glucose, Bld 125 (H) 70 - 99 mg/dL    Comment: Glucose reference range applies only to samples taken after fasting for at least 8 hours.   BUN 38 (H) 8 - 23 mg/dL   Creatinine, Ser 8.69 (H) 0.44 - 1.00 mg/dL   Calcium  8.5 (L) 8.9 - 10.3 mg/dL   Total Protein 5.8 (L) 6.5 - 8.1 g/dL   Albumin 2.5 (L) 3.5 - 5.0 g/dL   AST 14 (L) 15 - 41 U/L   ALT 8 0 - 44 U/L   Alkaline Phosphatase 78 38 - 126 U/L   Total Bilirubin 1.4 (H) 0.0 - 1.2 mg/dL   GFR, Estimated 42 (L) >60 mL/min    Comment: (NOTE) Calculated using the CKD-EPI Creatinine Equation (2021)    Anion gap 14 5 - 15    Comment: Performed at [facility], [address] [place], [place] [address]  Magnesium      Status: None   Collection Time: 05/20/23  2:59 PM  Result Value Ref Range   Magnesium  2.0 1.7 - 2.4 mg/dL    Comment:  Performed at [facility] Lab, [address], [place], [place] [address]  Troponin I (High Sensitivity)     Status: Abnormal   Collection Time: 05/20/23  2:59 PM  Result Value Ref Range   Troponin I (High Sensitivity) 48 (H) <18 ng/L    Comment: (NOTE) Elevated high sensitivity troponin I (hsTnI) values and significant  changes across serial measurements may suggest ACS but many other  chronic and acute conditions are known to elevate hsTnI results.  Refer to the Links section for chest pain algorithms and additional  guidance. Performed at [facility] Lab, [address] [place], [place] [address]   SARS Coronavirus 2 by RT PCR (hospital order, performed in [facility] hospital lab) *cepheid single result test* Anterior Nasal Swab     Status: None   Collection Time: 05/20/23  2:59 PM   Specimen: Anterior Nasal Swab  Result Value Ref Range   SARS Coronavirus 2 by RT PCR NEGATIVE NEGATIVE    Comment: Performed at [facility] Lab, [address], [place], [place] [address]  Troponin I (High Sensitivity)     Status: Abnormal   Collection Time: 05/20/23  5:22 PM  Result Value Ref Range   Troponin I (High Sensitivity) 44 (H) <18 ng/L    Comment: (NOTE) Elevated high sensitivity troponin I (hsTnI) values and significant  changes across serial measurements may suggest ACS but many other  chronic and acute conditions are known to elevate hsTnI results.  Refer to the Links section for chest pain algorithms and additional  guidance. Performed at [facility] Lab, [address] [place], [place] [address]   CBG monitoring, ED     Status: Abnormal   Collection Time: 05/20/23 11:16 PM  Result Value Ref Range  Glucose-Capillary 116 (H) 70 - 99 mg/dL    Comment: Glucose reference range applies only to samples taken after fasting for at least 8 hours.  Basic metabolic panel     Status: Abnormal   Collection Time: 05/21/23  4:44 AM  Result Value Ref Range   Sodium 147 (H)  135 - 145 mmol/L   Potassium 3.3 (L) 3.5 - 5.1 mmol/L   Chloride 104 98 - 111 mmol/L   CO2 28 22 - 32 mmol/L   Glucose, Bld 129 (H) 70 - 99 mg/dL    Comment: Glucose reference range applies only to samples taken after fasting for at least 8 hours.   BUN 35 (H) 8 - 23 mg/dL   Creatinine, Ser 8.62 (H) 0.44 - 1.00 mg/dL   Calcium  8.5 (L) 8.9 - 10.3 mg/dL   GFR, Estimated 40 (L) >60 mL/min    Comment: (NOTE) Calculated using the CKD-EPI Creatinine Equation (2021)    Anion gap 15 5 - 15    Comment: Performed at [facility] Lab, [address] [place], [place] [address]  Procalcitonin     Status: None   Collection Time: 05/21/23  4:44 AM  Result Value Ref Range   Procalcitonin <0.10 ng/mL    Comment:        Interpretation: PCT (Procalcitonin) <= 0.5 ng/mL: Systemic infection (sepsis) is not likely. Local bacterial infection is possible. (NOTE)       Sepsis PCT Algorithm           Lower Respiratory Tract                                      Infection PCT Algorithm    ----------------------------     ----------------------------         PCT < 0.25 ng/mL                PCT < 0.10 ng/mL          Strongly encourage             Strongly discourage   discontinuation of antibiotics    initiation of antibiotics    ----------------------------     -----------------------------       PCT 0.25 - 0.50 ng/mL            PCT 0.10 - 0.25 ng/mL               OR       >80% decrease in PCT            Discourage initiation of                                            antibiotics      Encourage discontinuation           of antibiotics    ----------------------------     -----------------------------         PCT >= 0.50 ng/mL              PCT 0.26 - 0.50 ng/mL               AND        <80% decrease in PCT             Encourage initiation of  antibiotics       Encourage continuation           of antibiotics    ----------------------------      -----------------------------        PCT >= 0.50 ng/mL                  PCT > 0.50 ng/mL               AND         increase in PCT                  Strongly encourage                                      initiation of antibiotics    Strongly encourage escalation           of antibiotics                                     -----------------------------                                           PCT <= 0.25 ng/mL                                                 OR                                        > 80% decrease in PCT                                      Discontinue / Do not initiate                                             antibiotics  Performed at [facility] Lab, [address] [place], [place] [address]   CBC     Status: Abnormal   Collection Time: 05/21/23  4:44 AM  Result Value Ref Range   WBC 10.4 4.0 - 10.5 K/uL   RBC 3.11 (L) 3.87 - 5.11 MIL/uL   Hemoglobin 9.1 (L) 12.0 - 15.0 g/dL   HCT 67.9 (L) 63.9 - 53.9 %   MCV 102.9 (H) 80.0 - 100.0 fL   MCH 29.3 26.0 - 34.0 pg   MCHC 28.4 (L) 30.0 - 36.0 g/dL   RDW 83.9 (H) 88.4 - 84.4 %   Platelets 284 150 - 400 K/uL   nRBC 0.0 0.0 - 0.2 %    Comment: Performed at [facility] Lab, [address] [place], [place] [address]  CBG monitoring, ED     Status: Abnormal   Collection Time: 05/21/23  7:47 AM  Result Value Ref Range   Glucose-Capillary 113 (H) 70 - 99 mg/dL  Comment: Glucose reference range applies only to samples taken after fasting for at least 8 hours.   Comment 1 Notify RN    Comment 2 Document in Chart   CBG monitoring, ED     Status: Abnormal   Collection Time: 05/21/23 11:43 AM  Result Value Ref Range   Glucose-Capillary 107 (H) 70 - 99 mg/dL    Comment: Glucose reference range applies only to samples taken after fasting for at least 8 hours.   Comment 1 Notify RN    Comment 2 Document in Chart    DG Chest Port 1 View Result Date: 05/20/2023 CLINICAL DATA:  Two day history of shortness of  breath EXAM: PORTABLE CHEST 1 VIEW COMPARISON:  Chest radiograph dated 04/12/2023 FINDINGS: Lines/tubes: Left chest wall pacemaker leads project over the right atrium and ventricle. Lungs: Well inflated lungs. Diffuse bilateral patchy opacities, right-greater-than-left. Asymmetric hazy right lung opacities. Pleura: Trace blunting of the costophrenic angles.  No pneumothorax. Heart/mediastinum: Similar enlarged cardiomediastinal silhouette. Bones: Median sternotomy wires are nondisplaced. IMPRESSION: 1. Diffuse bilateral patchy opacities, right-greater-than-left, which may represent pulmonary edema or multifocal infection. 2. Trace blunting of the costophrenic angles, which may represent trace pleural effusions. Electronically Signed   By: Limin  Xu M.D.   On: 05/20/2023 15:48    Pending Labs Unresulted Labs (From admission, onward)     Start     Ordered   05/22/23 0500  CBC  Tomorrow morning,   R        05/21/23 0704   05/22/23 0500  Basic metabolic panel  Tomorrow morning,   R        05/21/23 0704            Vitals/Pain Today's Vitals   05/21/23 0734 05/21/23 0900 05/21/23 1047 05/21/23 1137  BP: 93/70 123/86 94/67 (!) 138/52  Pulse: 74 69 60 65  Resp: 20 17  17   Temp: 97.7 F (36.5 C)   97.7 F (36.5 C)  TempSrc: Oral   Oral  SpO2: 98% 96%  95%  Weight: 200 lb (90.7 kg)     Height: 5' (1.524 m)     PainSc: 0-No pain       Isolation Precautions Airborne and Contact precautions  Medications Medications  acetaminophen  (TYLENOL ) tablet 650 mg (has no administration in time range)  terbinafine  (LAMISIL ) tablet 250 mg (250 mg Oral Not Given 05/21/23 1048)  atorvastatin  (LIPITOR) tablet 40 mg (40 mg Oral Given 05/21/23 1045)  hydrALAZINE  (APRESOLINE ) tablet 25 mg (25 mg Oral Not Given 05/21/23 0631)  torsemide  (DEMADEX ) tablet 60 mg (60 mg Oral Given 05/21/23 0736)  FLUoxetine  (PROZAC ) capsule 40 mg (40 mg Oral Given 05/21/23 1047)  carvedilol  (COREG ) tablet 3.125 mg (3.125 mg Oral Not  Given 05/21/23 1047)  isosorbide  mononitrate (IMDUR ) 24 hr tablet 15 mg (15 mg Oral Given 05/21/23 1046)  insulin  glargine-yfgn (SEMGLEE ) injection 6 Units (6 Units Subcutaneous Given 05/20/23 2317)  levothyroxine  (SYNTHROID ) tablet 75 mcg (75 mcg Oral Given 05/21/23 0630)  pantoprazole  (PROTONIX ) EC tablet 40 mg (40 mg Oral Given 05/21/23 0736)  sucralfate  (CARAFATE ) tablet 1 g (1 g Oral Not Given 05/21/23 1203)  apixaban  (ELIQUIS ) tablet 5 mg (5 mg Oral Given 05/21/23 1046)  ferrous sulfate  tablet 325 mg (325 mg Oral Given 05/21/23 1046)  gabapentin  (NEURONTIN ) capsule 600 mg (600 mg Oral Given 05/20/23 2227)  loratadine  (CLARITIN ) tablet 10 mg (10 mg Oral Given 05/20/23 2228)  insulin  aspart (novoLOG ) injection 0-9 Units ( Subcutaneous Not Given 05/21/23  1146)  cholecalciferol  (VITAMIN D3) 25 MCG (1000 UNIT) tablet 2,000 Units (2,000 Units Oral Given 05/21/23 1046)  albuterol  (PROVENTIL ) (2.5 MG/3ML) 0.083% nebulizer solution 2.5 mg (has no administration in time range)  mometasone -formoterol  (DULERA ) 200-5 MCG/ACT inhaler 2 puff (2 puffs Inhalation Not Given 05/21/23 0739)  ipratropium (ATROVENT ) 0.06 % nasal spray 2 spray (2 sprays Each Nare Not Given 05/21/23 1048)  ipratropium-albuterol  (DUONEB) 0.5-2.5 (3) MG/3ML nebulizer solution 3 mL (3 mLs Nebulization Given 05/21/23 1145)  cefTRIAXone  (ROCEPHIN ) 1 g in sodium chloride  0.9 % 100 mL IVPB (0 g Intravenous Stopped 05/20/23 2018)  azithromycin  (ZITHROMAX ) 500 mg in sodium chloride  0.9 % 250 mL IVPB (0 mg Intravenous Stopped 05/20/23 2122)  potassium chloride  (KLOR-CON ) packet 40 mEq (40 mEq Oral Given 05/21/23 0033)  potassium chloride  (KLOR-CON ) packet 40 mEq (40 mEq Oral Given 05/21/23 0736)    Mobility non-ambulatory     Focused Assessments Pulmonary Assessment Handoff:  Lung sounds: Bilateral Breath Sounds: Diminished O2 Device: Nasal Cannula O2 Flow Rate (L/min): 3 L/min    R Recommendations: See Admitting Provider Note  Report given to:   Additional  Notes:

## 2023-05-21 NOTE — Plan of Care (Addendum)
 FMTS Interim Progress Note  S: Saw patient at bedside. Patient feels breathing has gotten somewhat better and found the breathing treatments to be helpful. No chest pain. Patient prefers to stay the night for more breathing treatments and monitoring.   O: BP (!) 154/44 (BP Location: Right Arm)   Pulse 67   Temp 98.1 F (36.7 C) (Oral)   Resp 18   Ht 5' (1.524 m)   Wt 90.7 kg   SpO2 97%   BMI 39.06 kg/m    General: No acute distress. Sleeping, easily awoken.  CV: Loud systolic murmur. No S3/S4. Warm and well-perfused. Pulm: Shallow breathing on 3.5L O2. Coarse breath sounds throughout.  Some abdominal breathing. Abd: BS present. Soft, non-tender, non-distended. Skin:  Warm, dry. Psych: Pleasant and appropriate.   A/P: Dyspnea Satting well on home O2 at this time. Some increased WOB, with subjective relief from duoneb treatments.  -Cont duonebs q4 scheduled -Albuterol  nebs q2 prn -Continue home O2  Continue treatment plan as otherwise indicated in FMTS progress note.   Diona Perkins, MD 05/21/2023, 6:11 PM PGY-1, [facility] - Inpatient Family Medicine Service pager [phone]   Addendum 20:25 05/21/23: Called and spoke with patient's daughter regarding updates and care plan. Daughter will call Pulmonology office to move up patient's appointment sooner. Daughter also has meeting with Palliative/Hospice planned for later this week. Daughter expressed understanding throughout conversation, and questions were addressed.

## 2023-05-21 NOTE — Progress Notes (Signed)
 Daily Progress Note Intern Pager: [phone]  Patient name: Tonya Glass Medical record number: [id number] Date of birth: 05-19-1945 Age: 78 y.o. Gender: female  Primary Care Provider: No primary care provider on file. Consultants: None Code Status: DNR/DNI  Pt Overview and Major Events to Date:  05/20/23: Admitted to FMTS  Assessment and Plan:  Tonya Glass is a 78 y.o. 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  She has had 2 back-to-back admissions recently, 3 weeks ago with multifocal pneumonia and 3 weeks prior to that due to shortness of breath. Overall concerning for progressive chronic pulmonary disease. Less likely ADHF or infection. Possible PM discharge today.  Assessment & Plan Shortness of breath Differential is broad as listed above for this patient with recurrent admissions with shortness of breath and similar CXR findings previously. Has been previously worked up for autoimmune process (ANA +),  hypersensitivity pneumonitis, Legionella and Strep pneumo urine antigens which were negative. Pt was planning to f/u out pt w/ pulm w/in the next few weeks. BNP 580. Procal wnl.  Trop 48 > 44. Patient remains afebrile, satting well on home O2.  - Duonebs q4 scheduled, albuterol  nebs prn  - s/p CTX and Azithromycin  in the ED - no indication to continue at this time - continue home Torsemide  60 BID for HFrEF - O2 supplementation (home is 4 L), goal SpO2 88-92% given COPD  - Consider palliative as indicated - AM BMP - replete electrolytes as needed   Anemia Chronic, baseline hemoglobin ~9-10.  Did receive blood transfusions during last hospitalization in setting of GI bleed requiring cauterization for angiectasia.  No reports of blood in stool at  this time.  CBC indicates downtrending macrocytic anemia. -CTM with CBC -Consider workup for macrocytosis if persists -Continue twice daily PPI -Continue iron supplement   Chronic and Stable Issues: HFrEF: Continue Coreg  3.125 mg BID and Torsemide  60 mg BID HTN: Continue Imdur  15 mg daily, hydralazine  25 mg Q8h Hypothyroidism: Continue levothyroxine  75 mcg daily Depression: Continue fluoxetine  40 mg daily, held trazodone  25 mg nightly (recommended for PCP to discontinue at discharge last admission) Peripheral neuropathy: Continue gabapentin  600 mg nightly Osteoporosis: Continue cholecalciferol  2000 IU daily.  Also on alendronate 70 mg every Sunday, hold inpatient. HLD: Continue home atorvastatin  40 mg daily Chronic LE venous stasis: continue to monitor T2DM: Continue home semglee  6 U and sSSI - adjust as needed  FEN/GI: Heart healthy  PPx: Eliquis  5 BID Dispo: Home pending clinical improvement   Subjective:  Patient feeling unchanged this morning.  No reported pain.  No cough.  Breathing shallow.  Reduced appetite.  Objective: Temp:  [97.9 F (36.6 C)-98.7 F (37.1 C)] 98.7 F (37.1 C) (01/07 0629) Pulse Rate:  [59-70] 59 (01/07 0630) Resp:  [15-20] 16 (01/07 0630) BP: (104-148)/(38-91) 106/91 (01/07 0630) SpO2:  [93 %-100 %] 98 % (01/07 0630) Physical Exam: General: No acute distress CV: Loud systolic murmur. No S3/S4. Warm and well-perfused. Pulm: Shallow breathing on 3.5L O2. CTA upper lobes.  Coarse breath sounds and scattered crackles in the lower lobes.  Some abdominal breathing. Abd: BS present. Soft, non-tender, non-distended. Skin:  Warm, dry. Psych: Pleasant and appropriate.   Laboratory: Most recent CBC Lab Results  Component Value Date  WBC 10.4 05/21/2023   HGB 9.1 (L) 05/21/2023   HCT 32.0 (L) 05/21/2023   MCV 102.9 (H) 05/21/2023   PLT 284 05/21/2023   Most recent BMP    Latest Ref Rng & Units 05/21/2023    4:44 AM  BMP  Glucose 70 - 99 mg/dL 870    BUN 8 - 23 mg/dL 35   Creatinine 9.55 - 1.00 mg/dL 8.62   Sodium 864 - 854 mmol/L 147   Potassium 3.5 - 5.1 mmol/L 3.3   Chloride 98 - 111 mmol/L 104   CO2 22 - 32 mmol/L 28   Calcium  8.9 - 10.3 mg/dL 8.5    Procal <9.89  Tonya Perkins, MD 05/21/2023, 7:02 AM  PGY-1, [facility] Family Medicine FPTS Intern pager: [phone], text pages welcome Secure chat group [facility] Teaching Service

## 2023-05-21 NOTE — Progress Notes (Signed)
 Transition of Care [facility]) - Inpatient Brief Assessment   Patient Details  Name: Blanchie Zeleznik MRN: [id number] Date of Birth: 1946/03/09  Transition of Care [facility]) CM/SW Contact:    Rosaline JONELLE Joe, RN Phone Number: 05/21/2023, 3:54 PM   Clinical Narrative: CM met with the patient at the bedside and provided Medicare Observation notice.  The patient lives at [facility] long term and plans to return to the facility when medically stable for discharge.  Patient has been living at the facility for years and remains on chronic oxygen at the facility.  No other TOC needs and CM will continue to follow the patient for TOC needs and return to [facility] when stable.   Transition of Care Asessment: Insurance and Status: (P) Insurance coverage has been reviewed Patient has primary care physician: (P) Yes Home environment has been reviewed: (P) from [facility] facility Prior level of function:: (P) SNF facility for care Prior/Current Home Services: (P) No current home services Social Drivers of Health Review: (P) SDOH reviewed interventions complete Readmission risk has been reviewed: (P) Yes Transition of care needs: (P) transition of care needs identified, TOC will continue to follow

## 2023-05-21 NOTE — Assessment & Plan Note (Addendum)
 Chronic, baseline hemoglobin ~9-10.  Did receive blood transfusions during last hospitalization in setting of GI bleed requiring cauterization for angiectasia.  No reports of blood in stool at this time.  CBC indicates downtrending macrocytic anemia. -CTM with CBC -Consider workup for macrocytosis if persists -Continue twice daily PPI -Continue iron supplement

## 2023-05-22 ENCOUNTER — Telehealth: Payer: Self-pay | Admitting: Internal Medicine

## 2023-05-22 ENCOUNTER — Telehealth (HOSPITAL_COMMUNITY): Payer: Self-pay | Admitting: Pharmacy Technician

## 2023-05-22 ENCOUNTER — Other Ambulatory Visit (HOSPITAL_COMMUNITY): Payer: Self-pay

## 2023-05-22 DIAGNOSIS — R0602 Shortness of breath: Secondary | ICD-10-CM

## 2023-05-22 DIAGNOSIS — T829XXA Unspecified complication of cardiac and vascular prosthetic device, implant and graft, initial encounter: Secondary | ICD-10-CM | POA: Insufficient documentation

## 2023-05-22 LAB — GLUCOSE, CAPILLARY
Glucose-Capillary: 138 mg/dL — ABNORMAL HIGH (ref 70–99)
Glucose-Capillary: 129 mg/dL — ABNORMAL HIGH (ref 70–99)
Glucose-Capillary: 172 mg/dL — ABNORMAL HIGH (ref 70–99)
Glucose-Capillary: 107 mg/dL — ABNORMAL HIGH (ref 70–99)

## 2023-05-22 LAB — CBC
HCT: 25.9 % — ABNORMAL LOW (ref 36.0–46.0)
Hemoglobin: 7.8 g/dL — ABNORMAL LOW (ref 12.0–15.0)
MCH: 29.5 pg (ref 26.0–34.0)
MCHC: 30.1 g/dL (ref 30.0–36.0)
MCV: 98.1 fL (ref 80.0–100.0)
Platelets: 261 10*3/uL (ref 150–400)
RBC: 2.64 MIL/uL — ABNORMAL LOW (ref 3.87–5.11)
RDW: 15.9 % — ABNORMAL HIGH (ref 11.5–15.5)
WBC: 11.8 10*3/uL — ABNORMAL HIGH (ref 4.0–10.5)
nRBC: 0 % (ref 0.0–0.2)

## 2023-05-22 LAB — BASIC METABOLIC PANEL
Anion gap: 12 (ref 5–15)
BUN: 41 mg/dL — ABNORMAL HIGH (ref 8–23)
CO2: 30 mmol/L (ref 22–32)
Calcium: 8.3 mg/dL — ABNORMAL LOW (ref 8.9–10.3)
Chloride: 99 mmol/L (ref 98–111)
Creatinine, Ser: 1.63 mg/dL — ABNORMAL HIGH (ref 0.44–1.00)
GFR, Estimated: 32 mL/min — ABNORMAL LOW (ref >60)
Glucose, Bld: 112 mg/dL — ABNORMAL HIGH (ref 70–99)
Potassium: 3.3 mmol/L — ABNORMAL LOW (ref 3.5–5.1)
Sodium: 141 mmol/L (ref 135–145)

## 2023-05-22 MED ORDER — POTASSIUM CHLORIDE CRYS ER 20 MEQ PO TBCR
40.0000 meq | EXTENDED_RELEASE_TABLET | Freq: Once | ORAL | Status: AC
  Administered 2023-05-22: 40 meq via ORAL
  Filled 2023-05-22: qty 2

## 2023-05-22 MED ORDER — TRELEGY ELLIPTA 100-62.5-25 MCG/ACT IN AEPB: 1.0000 | INHALATION_SPRAY | Freq: Every day | RESPIRATORY_TRACT | Status: DC

## 2023-05-22 MED ORDER — IPRATROPIUM-ALBUTEROL 0.5-2.5 (3) MG/3ML IN SOLN: 3.0000 mL | RESPIRATORY_TRACT | Status: DC | PRN

## 2023-05-22 MED ORDER — MEDIHONEY WOUND/BURN DRESSING EX PSTE
1.0000 | PASTE | Freq: Every day | CUTANEOUS | Status: DC
  Administered 2023-05-22 – 2023-05-23 (×2): 1 via TOPICAL
  Filled 2023-05-22: qty 44

## 2023-05-22 MED ORDER — IPRATROPIUM-ALBUTEROL 0.5-2.5 (3) MG/3ML IN SOLN
3.0000 mL | Freq: Three times a day (TID) | RESPIRATORY_TRACT | Status: DC
  Administered 2023-05-22 – 2023-05-23 (×5): 3 mL via RESPIRATORY_TRACT
  Filled 2023-05-22 (×5): qty 3

## 2023-05-22 MED ORDER — GERHARDT'S BUTT CREAM
TOPICAL_CREAM | Freq: Two times a day (BID) | CUTANEOUS | Status: DC
  Filled 2023-05-22: qty 60

## 2023-05-22 NOTE — Plan of Care (Addendum)
 Spoke to the nursing staff about patient's pacer. Per nursing, Patient 's pacer is oversensing for T wave and QRS wave per CCMD. [employer], and they will have the rep come to interrogate the pacer. Probably will have report in physical chart. Will await report.   UPDATE: Rep to see patient with EP later today.   Laurier Hugger, MD

## 2023-05-22 NOTE — Telephone Encounter (Signed)
 Pharmacy Patient Advocate Encounter  Insurance verification completed.    The patient is insured through [facility]. Patient has Medicare and is not eligible for a copay card, but may be able to apply for patient assistance or Medicare RX Payment Plan (Patient Must reach out to their plan, if eligible for payment plan), if available.    Ran test claim for Trelegy and the current 30 day co-pay is $0.00. Ran test claim for Breztri and the current 30 day co-pay is $0.00.  This test claim was processed through  [facility]- copay amounts may vary at other pharmacies due to pharmacy/plan contracts, or as the patient moves through the different stages of their insurance plan.

## 2023-05-22 NOTE — Progress Notes (Signed)
 OT Cancellation Note  Patient Details Name: Grissel Tyrell MRN: [id number] DOB: 1945-06-10   Cancelled Treatment:    Reason Eval/Treat Not Completed: OT screened, no needs identified, will sign off. Pt with baseline dependence in ADLs and mobility.  Kennth Mliss Helling 05/22/2023, 7:29 AM Mliss HERO, OTR/L Acute Rehabilitation Services Office: [phone]

## 2023-05-22 NOTE — Assessment & Plan Note (Addendum)
 Most likely secondary to progressive chronic pulmonary disease. BNP 580. Procal wnl. Trop 48 > 44. Patient remains afebrile, satting well on home O2.  - Duonebs q4 scheduled, albuterol  nebs prn  - Continue home Atrovent , Dulera  (form equiv for home Symbicort) for COPD - Plan for discharge with Trelegy and duonebs prn - continue home Torsemide  60 BID for HFrEF - s/p CTX and Azithromycin  in the ED - no indication to continue at this time - O2 supplementation (home is 4 L), goal SpO2 88-92% given COPD  - Per patient's daughter, has appts with Palliative and Pulmonology scheduled outpatient  - AM BMP - replete electrolytes as needed

## 2023-05-22 NOTE — Assessment & Plan Note (Addendum)
 Chronic, baseline hemoglobin ~9-10.  Did receive blood transfusions during last hospitalization in setting of GI bleed requiring cauterization for angiectasia.  No reports of blood in stool during admission. Continue PPI BID and iron supplement.

## 2023-05-22 NOTE — Consult Note (Signed)
 WOC Nurse Consult Note: patient is known to WOC team from previous admission; last seen in person 04/30/2023 with Stage 1 and Stage 2 PI to sacrum/buttocks  This consult performed remotely utilizing EMR including photo documentation  Reason for Consult: sacral breakdown  Wound type: Stage 2 PI sacrum that has evolved to areas of Unstageable PI most lateral aspects (onto B buttocks); several small areas unstageable PI on medial and distal buttocks as well  Pressure Injury POA: Yes Measurement: see nursing flowsheet  Wound bed: 100% yellow slough noted to most lateral aspects of sacral wound; areas to buttocks 75% yellow slough 25% pink moist  Drainage (amount, consistency, odor)  per nursing flowsheet  Periwound: erythema and peeling skin, appears to have satellite lesions that would be consistent with candida  Dressing procedure/placement/frequency: Clean entire sacrum/buttocks/posterior thighs with Vashe wound cleanser Soila [phone]), apply Medihoney to wound beds daily, cover with dry gauze.   Coat surrounding skin of buttocks/sacrum/posterior thighs with Gerhardt's Butt Cream 2 times daily and prn soiling.  Secure entire area with ABD pad.   POC discussed with bedside nurse. WOC team will not follow. Re-consult if further needs arise.   Thank you,    Powell Bar MSN, RN-BC, [employer] [phone]

## 2023-05-22 NOTE — Assessment & Plan Note (Signed)
 Patient's pacemaker reportedly oversensing oversensing for T wave and QRS wave per CCMD. Telemetry with V-pacing overnight. Pacemaker [employer] rep to see patient with EP. Will fu prior to hopeful discharge.

## 2023-05-22 NOTE — Progress Notes (Signed)
 Daily Progress Note Intern Pager: [phone]  Patient name: Tonya Glass Medical record number: [id number] Date of birth: [birth year] Age: 78 y.o. Gender: female  Primary Care Provider: No primary care provider on file. Consultants: None Code Status: DNR/DNI   Pt Overview and Major Events to Date:  05/20/23: Admitted to FMTS   Assessment and Plan:   Tonya Glass is a 78 y.o. 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  She has had 2 back-to-back admissions recently, 3 weeks ago with multifocal pneumonia and 3 weeks prior to that due to shortness of breath. Overall concerning for progressive chronic pulmonary disease. Less likely ADHF or infection. Hopeful discharge back to facility today. Pending eval of pacemaker.  Assessment & Plan Shortness of breath Most likely secondary to progressive chronic pulmonary disease. BNP 580. Procal wnl. Trop 48 > 44. Patient remains afebrile, satting well on home O2.  - Duonebs q4 scheduled, albuterol  nebs prn  - Continue home Atrovent , Dulera  (form equiv for home Symbicort) for COPD - Plan for discharge with Trelegy and duonebs prn - continue home Torsemide  60 BID for HFrEF - s/p CTX and Azithromycin  in the ED - no indication to continue at this time - O2 supplementation (home is 4 L), goal SpO2 88-92% given COPD  - Per patient's daughter, has appts with Palliative and Pulmonology scheduled outpatient  - AM BMP - replete electrolytes as needed  Anemia Chronic, baseline hemoglobin ~9-10.  Did receive blood transfusions during last hospitalization in setting of GI bleed requiring cauterization for angiectasia.  No reports of blood in stool during admission. Continue PPI BID and iron supplement.  Pacemaker  complications Patient's pacemaker reportedly oversensing oversensing for T wave and QRS wave per CCMD. Telemetry with V-pacing overnight. Pacemaker [employer] rep to see patient with EP. Will fu prior to hopeful discharge.   Chronic and Stable Issues: HFrEF: Continue Coreg  3.125 mg BID and Torsemide  60 mg BID HTN: Continue Imdur  15 mg daily, hydralazine  25 mg Q8h Hypothyroidism: Continue levothyroxine  75 mcg daily Depression: Continue fluoxetine  40 mg daily, held trazodone  25 mg nightly (recommended for PCP to discontinue at discharge last admission) Peripheral neuropathy: Continue gabapentin  600 mg nightly Osteoporosis: Continue cholecalciferol  2000 IU daily.  Also on alendronate 70 mg every Sunday, hold inpatient. HLD: Continue home atorvastatin  40 mg daily Chronic LE venous stasis: continue to monitor T2DM: Continue home semglee  6 U and sSSI - adjust as needed   FEN/GI: Heart healthy  PPx: Eliquis  5 BID Dispo: Hopeful home to facility today pending pacemaker eval  Subjective:  Patient feeling better this morning.  Breathing easier.  She found the DuoNebs breathing treatments to be very helpful.  No chest pain.  She feels comfortable returning to her facility today with continued breathing treatments as needed.  Objective: Temp:  [97.5 F (36.4 C)-99.6 F (37.6 C)] 98.6 F (37 C) (01/08 0446) Pulse Rate:  [58-75] 72 (01/08 0446) Resp:  [16-20] 18 (01/08 0446) BP: (93-154)/(39-86) 127/47 (01/08 0446) SpO2:  [94 %-98 %] 97 % (01/08 0446) Weight:  [90.7 kg] 90.7 kg (01/07 0734) Physical Exam: General: No acute distress.  Resting comfortably in bed. CV: Loud systolic murmur. No extra heart sounds. Warm and well-perfused. Pulm: Breathing comfortably on 3.5L O2.  Crackles  throughout. No increased WOB. Abd: Soft, non-tender, non-distended. Ext: Chronic lower extremity edema. Skin:  Warm, dry. Psych: Pleasant and appropriate.   Laboratory: Most recent CBC Lab Results   Component Value Date   WBC 10.4 05/21/2023   HGB 9.1 (L) 05/21/2023   HCT 32.0 (L) 05/21/2023   MCV 102.9 (H) 05/21/2023   PLT 284 05/21/2023   Most recent BMP    Latest Ref Rng & Units 05/21/2023    4:44 AM  BMP  Glucose 70 - 99 mg/dL 870   BUN 8 - 23 mg/dL 35   Creatinine 9.55 - 1.00 mg/dL 8.62   Sodium 864 - 854 mmol/L 147   Potassium 3.5 - 5.1 mmol/L 3.3   Chloride 98 - 111 mmol/L 104   CO2 22 - 32 mmol/L 28   Calcium  8.9 - 10.3 mg/dL 8.5     Diona Perkins, MD 05/22/2023, 7:33 AM  PGY-1, [place] [facility] FPTS Intern pager: [phone], text pages welcome Secure chat group [facility] Med Teaching Service

## 2023-05-22 NOTE — Plan of Care (Signed)
  Problem: Coping: Goal: Ability to adjust to condition or change in health will improve Outcome: Progressing   Problem: Fluid Volume: Goal: Ability to maintain a balanced intake and output will improve Outcome: Progressing   Problem: Health Behavior/Discharge Planning: Goal: Ability to identify and utilize available resources and services will improve Outcome: Progressing   Problem: Metabolic: Goal: Ability to maintain appropriate glucose levels will improve Outcome: Progressing   Problem: Nutritional: Goal: Maintenance of adequate nutrition will improve Outcome: Progressing   Problem: Coping: Goal: Level of anxiety will decrease Outcome: Progressing   Problem: Education: Goal: Knowledge of General Education information will improve Description: Including pain rating scale, medication(s)/side effects and non-pharmacologic comfort measures Outcome: Progressing   Problem: Pain Management: Goal: General experience of comfort will improve Outcome: Progressing   Problem: Safety: Goal: Ability to remain free from injury will improve Outcome: Progressing

## 2023-05-22 NOTE — Plan of Care (Signed)
 Called patient's daughter, confirmed identity.  Discussed patient would not be discharged today.  She had adjustments to her pacemaker, and we would like electrophysiology to see patient prior to discharge for safe disposition.  Daughter expressed understanding and agreement with plan.

## 2023-05-22 NOTE — Plan of Care (Signed)

## 2023-05-22 NOTE — Telephone Encounter (Signed)
 I called and spoke with the pt's daughter  She states that the pt keeps getting PNA and she is supposed to be d/c from hospital today  She states that the doctor that is d/c pt told her that she needs to be seen here urgently  Next opening not until 06/24/23  Dr Meade, are you will to overbook to see this pt?

## 2023-05-22 NOTE — Discharge Summary (Addendum)
 Family Medicine Teaching [facility] Discharge Summary  Patient name: Tonya Glass Medical record number: [id number] Date of birth: [birth year] Age: 78 y.o. Gender: female Date of Admission: 05/20/2023  Date of Discharge: 05/23/23 Admitting Physician: Dr Jama CHRISTELLA Bee  Primary Care Provider: No primary care provider on file. Consultants: None  Indication for Hospitalization: Dyspnea  Discharge Diagnoses/Problem List:  Principal Problem for Admission: Dyspnea Other Problems addressed during stay:  Principal Problem:   Shortness of breath Active Problems:   Anemia   Pacemaker complications   Brief Hospital Course:  Tonya Glass is a 78 y.o.female with a history of HFrEF, COPD, HLD, HTN, SSS, hypothyroidism, peripheral neuropathy, aortic stenosis s/p AVR who was admitted to the [facility] Teaching Service at [facility] for shortness of breath likely in the setting of multifocal pneumonia. Her hospital course is detailed below:  SOB  COPD Patient admitted with increased work of breathing on 4 L O2 (home requirement). Arrived afebrile without leukocytosis, BNP 580. Troponin trended flat 48 > 44. EKG without ischemic findings. Initial CXR consistent with prior imaging, though ED did give one-time CTX and azithromycin . Abx were not continued given low concern for PNA. Overall patient presentation most likely secondary to progressive chronic lung disease. Received scheduled duonebs during admission with improvement of work of breathing. Patient remained afebrile throughout admission and stable on her home O2 requirement. Patient discharged with Trelegy and duonebs as needed. Per patient's daughter, they have upcoming appointments with Palliative and Pulmonology scheduled outpatient.  Pacemaker complication  Patient's pacemaker reportedly oversensing oversensing for T wave and QRS wave per CCMD. Telemetry with V-pacing, pacer oversensing for T wave and QRS wave. Interrogated by [employer]  rep who changed to DDDR 80 & extended AV delay. EP recommended outpatient follow up after discussion with rep.   Palliative Care:  Patient will be meeting with palliative for GOC discussion outpatient.   Other chronic conditions were medically managed with home medications and formulary alternatives as necessary: HFrEF: Continue Coreg  3.125 mg BID and Torsemide  60 mg BID HTN: Continue Imdur  15 mg daily, hydralazine  25 mg Q8h Hypothyroidism: Continue levothyroxine  75 mcg daily Depression: Continue fluoxetine  40 mg daily, held trazodone  25 mg nightly (recommended for PCP to discontinue at discharge last admission) Peripheral neuropathy: Continue gabapentin  600 mg nightly Osteoporosis: Continue cholecalciferol  2000 IU daily.  Also on alendronate 70 mg every Sunday, hold inpatient. HLD: Continue home atorvastatin  40 mg daily Anemia: Hgb stable around baseline. Continued PPI and iron.  PCP Follow-up Recommendations: Continue nighttime CPAP. Consider sleep study. Follow up Pulmonology recommendations   Follow up goals of care discussion with palliative/hospice  Consider d/c of trazodone  Follow up with EP outpatient  Disposition: Return to living facility  Discharge Condition: Stable  Discharge Exam:  Vitals:   05/22/23 1233 05/22/23 1423  BP: (!) 114/41 (!) 123/43  Pulse: 81 79  Resp: 18   Temp: 98.1 F (36.7 C)   SpO2: 96%    General: No acute distress.  Resting comfortably in bed. CV: Loud systolic murmur. No extra heart sounds. Warm and well-perfused. Pulm: Breathing comfortably on 3.5L O2.  Crackles throughout. No increased WOB. Abd: Soft, non-tender, non-distended. Ext: Chronic lower extremity edema. Skin:  Warm, dry. Psych: Pleasant and appropriate.  Significant Procedures: N/a  Significant Labs and Imaging:  Recent Labs  Lab 05/21/23 0444 05/22/23 1043  WBC 10.4 11.8*  HGB 9.1* 7.8*  HCT 32.0* 25.9*  PLT 284 261   Recent Labs  Lab 05/21/23 0444  05/22/23  1043  NA 147* 141  K 3.3* 3.3*  CL 104 99  CO2 28 30  GLUCOSE 129* 112*  BUN 35* 41*  CREATININE 1.37* 1.63*  CALCIUM  8.5* 8.3*   BNP    Component Value Date/Time   BNP 580.7 (H) 05/20/2023 1458   Troponin HS: 48 > 44 Procal: <0.10 CXR: chronic diffuse patchy opacities consistent with prior imaging  Results/Tests Pending at Time of Discharge: N/a  Discharge Medications:  Allergies as of 05/22/2023   No Known Allergies      Medication List     STOP taking these medications    budesonide -formoterol  160-4.5 MCG/ACT inhaler Commonly known as: SYMBICORT       TAKE these medications    acetaminophen  325 MG tablet Commonly known as: TYLENOL  Take 650 mg by mouth every 8 (eight) hours as needed for mild pain (pain score 1-3).   alendronate 70 MG tablet Commonly known as: FOSAMAX Take 70 mg by mouth every Sunday.   Aquaphor Adv Therapy Healing 41 % Oint Apply 1 Application topically See admin instructions. Apply 1 application topically every shift, apply to sacrum wound.   atorvastatin  40 MG tablet Commonly known as: LIPITOR Take 40 mg by mouth daily.   Benzethonium Chloride 0.13 % Liqd Apply 1 Application topically 2 (two) times a week. On Tuesdays and Fridays   carvedilol  3.125 MG tablet Commonly known as: COREG  Take 1 tablet (3.125 mg total) by mouth 2 (two) times daily.   Cholecalciferol  50 MCG (2000 UT) Tabs Take 1 tablet by mouth daily.   Eliquis  5 MG Tabs tablet Generic drug: apixaban  Take 5 mg by mouth 2 (two) times daily.   feeding supplement (PRO-STAT SUGAR FREE 64) Liqd Take 30 mLs by mouth 2 (two) times daily.   FLUoxetine  40 MG capsule Commonly known as: PROZAC  Take 40 mg by mouth daily.   gabapentin  600 MG tablet Commonly known as: NEURONTIN  Take 600 mg by mouth at bedtime.   guaiFENesin  600 MG 12 hr tablet Commonly known as: MUCINEX  Take 600 mg by mouth 2 (two) times daily.   HumaLOG KwikPen 100 UNIT/ML KwikPen Generic  drug: insulin  lispro Inject into the skin 2 (two) times daily. Sliding scale   hydrALAZINE  25 MG tablet Commonly known as: APRESOLINE  Take 1 tablet (25 mg total) by mouth every 8 (eight) hours.   ipratropium 0.06 % nasal spray Commonly known as: ATROVENT  Place 2 sprays into both nostrils 3 (three) times daily.   ipratropium-albuterol  0.5-2.5 (3) MG/3ML Soln Commonly known as: DUONEB Take 3 mLs by nebulization every 4 (four) hours as needed.   Iron 325 (65 Fe) MG Tabs Take 325 mg by mouth daily.   isosorbide  mononitrate 30 MG 24 hr tablet Commonly known as: IMDUR  Take 0.5 tablets (15 mg total) by mouth daily.   Lantus  SoloStar 100 UNIT/ML Solostar Pen Generic drug: insulin  glargine Inject 6 Units into the skin at bedtime.   levocetirizine 5 MG tablet Commonly known as: XYZAL Take 5 mg by mouth at bedtime.   levothyroxine  75 MCG tablet Commonly known as: SYNTHROID  Take 75 mcg by mouth daily.   magnesium  oxide 400 (240 Mg) MG tablet Commonly known as: MAG-OX Take 400 mg by mouth daily.   Nyamyc powder Generic drug: nystatin Apply 1 Application topically 3 (three) times daily.   pantoprazole  40 MG tablet Commonly known as: PROTONIX  Take 1 tablet (40 mg total) by mouth 2 (two) times daily before a meal.   ProAir  RespiClick 108 (90 Base) MCG/ACT  Aepb Generic drug: Albuterol  Sulfate Inhale 2 puffs into the lungs 4 (four) times daily as needed.   sodium chloride  0.65 % Soln nasal spray Commonly known as: OCEAN Place 2 sprays into both nostrils every 6 (six) hours as needed for congestion.   sucralfate  1 g tablet Commonly known as: CARAFATE  Take 1 g by mouth 4 (four) times daily -  with meals and at bedtime.   terbinafine  250 MG tablet Commonly known as: LAMISIL  Take 250 mg by mouth daily.   torsemide  20 MG tablet Commonly known as: DEMADEX  Take 60 mg by mouth 2 (two) times daily.   traZODone  50 MG tablet Commonly known as: DESYREL  Take 0.5 tablets (25 mg  total) by mouth at bedtime.   Trelegy Ellipta  100-62.5-25 MCG/ACT Aepb Generic drug: Fluticasone -Umeclidin-Vilant Inhale 1 Inhalation into the lungs daily.        Discharge Instructions: Please refer to Patient Instructions section of EMR for full details.  Patient was counseled important signs and symptoms that should prompt return to medical care, changes in medications, dietary instructions, activity restrictions, and follow up appointments.   Follow-Up Appointments:   See PCP for hospital follow up   Diona Perkins, MD 05/22/2023, 3:01 PM PGY-1, [facility] Family Medicine  Resident attestation: I agree with the documentation as above.   Laurier Hugger, MD

## 2023-05-22 NOTE — Telephone Encounter (Signed)
 PT is sched for a hospital FU but Dr's at hospital say she should be sooner than her 3/3 appt. W/Dr. Meade.  Daughter would like something immediately if poss. Daughter dcln appt tomorrow in [place] but can do any other day. Can Dr. Meade see her sooner. Daughter states she is very, very sick. Please call daughter to advise @ [phone]  (Nursing home will have to sched a ride for her. She can not walk)

## 2023-05-22 NOTE — Discharge Instructions (Addendum)
 Dear Tonya Glass,   Thank you so much for allowing us  to be part of your care!  You were admitted to [facility] for difficulty breathing. You received breathing treatments to help you feel better. Please continue using these as needed at your facility. We also updated your inhaler treatment. Please use Trelegy daily instead of Symbicort.   POST-HOSPITAL & CARE INSTRUCTIONS Please follow up with Pulmonology and Palliative teams as you have planned.  Please let PCP/Specialists know of any changes that were made.  Please see medications section of this packet for any medication changes.   DOCTOR'S APPOINTMENT & FOLLOW UP CARE INSTRUCTIONS  Future Appointments  Date Time Provider Department Center  05/29/2023 11:00 AM Parrett, Madelin RAMAN, NP [facility] None  07/24/2023  7:05 AM [facility] DEVICE REMOTES [facility]  08/01/2023 10:00 AM Croitoru, Mihai, MD [facility] None  10/23/2023  7:00 AM [facility] DEVICE REMOTES [facility]  01/22/2024  7:00 AM [facility] DEVICE REMOTES [facility]  04/22/2024  7:00 AM [facility] DEVICE REMOTES [facility]    RETURN PRECAUTIONS: - return if you experience shortness of breath - chest pain  - swelling in the legs  - confusion   Take care and be well!  Family Medicine Teaching Service  [place]  [facility]  [address] [place], [place] [address] [phone]

## 2023-05-22 NOTE — TOC CM/SW Note (Signed)
 Patient Goals and CMS Choice    Pt is from [facility] for long term care. Pt's daughter, Landry involved in pt's care, possible discharge in 1 day.   Facility notified and pt can return whenever discharge date is finalized.    TOC will continue to follow.     Expected Discharge Plan and Services                                                Prior Living Arrangements/Services                       Activities of Daily Living   ADL Screening (condition at time of admission) Independently performs ADLs?: No Does the patient have a NEW difficulty with bathing/dressing/toileting/self-feeding that is expected to last >3 days?: No Does the patient have a NEW difficulty with getting in/out of bed, walking, or climbing stairs that is expected to last >3 days?: No Does the patient have a NEW difficulty with communication that is expected to last >3 days?: No Is the patient deaf or have difficulty hearing?: No Does the patient have difficulty seeing, even when wearing glasses/contacts?: Yes Does the patient have difficulty concentrating, remembering, or making decisions?: Yes  Permission Sought/Granted                  Emotional Assessment              Admission diagnosis:  Shortness of breath [R06.02] SOB (shortness of breath) [R06.02] Multifocal pneumonia [J18.9] Patient Active Problem List   Diagnosis Date Noted   Pacemaker complications 05/22/2023   Shortness of breath 05/20/2023   Acute on chronic combined systolic and diastolic CHF (congestive heart failure) (HCC) 05/05/2023   Hematoma of right flank 05/03/2023   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 05/03/2023   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   Closed compression fracture of second lumbar vertebra (HCC) 05/02/2023   Gastrointestinal hemorrhage with melena 05/02/2023   Sacral wound 05/02/2023   Symptomatic anemia 05/01/2023   HFrEF (heart failure with  reduced ejection fraction) (HCC) 05/01/2023   Lumbar compression fracture (HCC) 05/01/2023   Heme positive stool 05/01/2023   SOB (shortness of breath) 04/18/2023   Pulmonary hypertension, unspecified (HCC) 04/15/2023   Concern for multidrug resistant pneumonia 04/15/2023   Acute systolic heart failure (HCC) 04/14/2023   Acute kidney injury 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) 04/11/2023   Anemia 04/11/2023   COPD (chronic obstructive pulmonary disease) (HCC) 04/11/2023   GERD (gastroesophageal reflux disease) 04/11/2023   Acute exacerbation of CHF (congestive heart failure) (HCC) 04/10/2023   Pressure injury of skin 06/23/2022   Cor pulmonale, chronic (HCC) 09/12/2021   PAH (pulmonary artery hypertension) (HCC) 09/12/2021   Peripheral venous insufficiency 09/12/2021   Stage 3b chronic kidney disease (HCC) 09/12/2021   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   Paravalvular leak (prosthetic valve) 08/07/2021   Nonrheumatic mitral valve regurgitation    Venous stasis ulcers of both lower extremities (HCC) 04/13/2021   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   Thoracic aortic aneurysm without rupture (HCC) 03/19/2018   Nonrheumatic aortic (valve) insufficiency 03/19/2018   Acute CHF (congestive heart failure) (HCC) 08/18/2017   Acute diastolic CHF (congestive heart failure) (HCC)  08/18/2017   CHF (congestive heart failure) (HCC) 08/18/2017   Hypothyroidism 07/23/2017   Acute on chronic hypoxic respiratory failure (HCC) 07/23/2017   Aortic valve stenosis 10/10/2016   Diastolic murmur 08/02/2016   SSS (sick sinus syndrome) (HCC) 03/20/2016   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   Atherosclerosis of native coronary artery of native heart without angina pectoris 09/06/2014   Pacemaker 05/28/2013   Essential hypertension 05/28/2013   Dyslipidemia 05/28/2013   S/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 Pharmacy:   [facility] [place], [place] - [address] [place] [address] Phone: [phone] Fax: [phone]  [facility] - [place], [place] - [id number] [place] [address] Phone: [phone] Fax: [phone]  [facility] - [place], [place] [address] [place] [address] Phone: [phone] Fax: [phone]     Social Determinants of Health (SDOH) Interventions    Readmission Risk Interventions     No data to display

## 2023-05-23 ENCOUNTER — Telehealth: Payer: Self-pay | Admitting: Internal Medicine

## 2023-05-23 ENCOUNTER — Inpatient Hospital Stay (HOSPITAL_COMMUNITY): Payer: 59

## 2023-05-23 DIAGNOSIS — Z66 Do not resuscitate: Secondary | ICD-10-CM | POA: Diagnosis present

## 2023-05-23 DIAGNOSIS — D6859 Other primary thrombophilia: Secondary | ICD-10-CM | POA: Diagnosis present

## 2023-05-23 DIAGNOSIS — N184 Chronic kidney disease, stage 4 (severe): Secondary | ICD-10-CM | POA: Diagnosis present

## 2023-05-23 DIAGNOSIS — I5082 Biventricular heart failure: Secondary | ICD-10-CM | POA: Diagnosis present

## 2023-05-23 DIAGNOSIS — Y831 Surgical operation with implant of artificial internal device as the cause of abnormal reaction of the patient, or of later complication, without mention of misadventure at the time of the procedure: Secondary | ICD-10-CM | POA: Diagnosis present

## 2023-05-23 DIAGNOSIS — I513 Intracardiac thrombosis, not elsewhere classified: Secondary | ICD-10-CM | POA: Diagnosis present

## 2023-05-23 DIAGNOSIS — D539 Nutritional anemia, unspecified: Secondary | ICD-10-CM | POA: Diagnosis present

## 2023-05-23 DIAGNOSIS — E1140 Type 2 diabetes mellitus with diabetic neuropathy, unspecified: Secondary | ICD-10-CM | POA: Diagnosis present

## 2023-05-23 DIAGNOSIS — F32A Depression, unspecified: Secondary | ICD-10-CM | POA: Diagnosis present

## 2023-05-23 DIAGNOSIS — L89310 Pressure ulcer of right buttock, unstageable: Secondary | ICD-10-CM | POA: Diagnosis present

## 2023-05-23 DIAGNOSIS — Z952 Presence of prosthetic heart valve: Secondary | ICD-10-CM | POA: Diagnosis not present

## 2023-05-23 DIAGNOSIS — I2729 Other secondary pulmonary hypertension: Secondary | ICD-10-CM | POA: Diagnosis present

## 2023-05-23 DIAGNOSIS — I480 Paroxysmal atrial fibrillation: Secondary | ICD-10-CM | POA: Diagnosis present

## 2023-05-23 DIAGNOSIS — R0600 Dyspnea, unspecified: Secondary | ICD-10-CM | POA: Diagnosis present

## 2023-05-23 DIAGNOSIS — R0602 Shortness of breath: Secondary | ICD-10-CM | POA: Diagnosis present

## 2023-05-23 DIAGNOSIS — E039 Hypothyroidism, unspecified: Secondary | ICD-10-CM | POA: Diagnosis present

## 2023-05-23 DIAGNOSIS — Z515 Encounter for palliative care: Secondary | ICD-10-CM | POA: Diagnosis not present

## 2023-05-23 DIAGNOSIS — L89152 Pressure ulcer of sacral region, stage 2: Secondary | ICD-10-CM | POA: Diagnosis present

## 2023-05-23 DIAGNOSIS — Z1152 Encounter for screening for COVID-19: Secondary | ICD-10-CM | POA: Diagnosis not present

## 2023-05-23 DIAGNOSIS — I495 Sick sinus syndrome: Secondary | ICD-10-CM | POA: Diagnosis present

## 2023-05-23 DIAGNOSIS — J441 Chronic obstructive pulmonary disease with (acute) exacerbation: Secondary | ICD-10-CM | POA: Diagnosis not present

## 2023-05-23 DIAGNOSIS — E1122 Type 2 diabetes mellitus with diabetic chronic kidney disease: Secondary | ICD-10-CM | POA: Diagnosis present

## 2023-05-23 DIAGNOSIS — I130 Hypertensive heart and chronic kidney disease with heart failure and stage 1 through stage 4 chronic kidney disease, or unspecified chronic kidney disease: Secondary | ICD-10-CM | POA: Diagnosis present

## 2023-05-23 DIAGNOSIS — J449 Chronic obstructive pulmonary disease, unspecified: Secondary | ICD-10-CM | POA: Diagnosis present

## 2023-05-23 DIAGNOSIS — J9611 Chronic respiratory failure with hypoxia: Secondary | ICD-10-CM | POA: Diagnosis present

## 2023-05-23 DIAGNOSIS — T829XXA Unspecified complication of cardiac and vascular prosthetic device, implant and graft, initial encounter: Secondary | ICD-10-CM | POA: Diagnosis present

## 2023-05-23 DIAGNOSIS — I5022 Chronic systolic (congestive) heart failure: Secondary | ICD-10-CM | POA: Diagnosis present

## 2023-05-23 DIAGNOSIS — Z794 Long term (current) use of insulin: Secondary | ICD-10-CM | POA: Diagnosis not present

## 2023-05-23 LAB — URINALYSIS, ROUTINE W REFLEX MICROSCOPIC
Bilirubin Urine: NEGATIVE
Glucose, UA: NEGATIVE mg/dL
Ketones, ur: NEGATIVE mg/dL
Nitrite: NEGATIVE
Protein, ur: NEGATIVE mg/dL
Specific Gravity, Urine: 1.012 (ref 1.005–1.030)
pH: 5 (ref 5.0–8.0)

## 2023-05-23 LAB — CBC
HCT: 25.6 % — ABNORMAL LOW (ref 36.0–46.0)
Hemoglobin: 7.8 g/dL — ABNORMAL LOW (ref 12.0–15.0)
MCH: 29.4 pg (ref 26.0–34.0)
MCHC: 30.5 g/dL (ref 30.0–36.0)
MCV: 96.6 fL (ref 80.0–100.0)
Platelets: 222 10*3/uL (ref 150–400)
RBC: 2.65 MIL/uL — ABNORMAL LOW (ref 3.87–5.11)
RDW: 16 % — ABNORMAL HIGH (ref 11.5–15.5)
WBC: 12 10*3/uL — ABNORMAL HIGH (ref 4.0–10.5)
nRBC: 0.2 % (ref 0.0–0.2)

## 2023-05-23 LAB — GLUCOSE, CAPILLARY
Glucose-Capillary: 135 mg/dL — ABNORMAL HIGH (ref 70–99)
Glucose-Capillary: 135 mg/dL — ABNORMAL HIGH (ref 70–99)
Glucose-Capillary: 181 mg/dL — ABNORMAL HIGH (ref 70–99)

## 2023-05-23 LAB — BASIC METABOLIC PANEL
Anion gap: 14 (ref 5–15)
BUN: 40 mg/dL — ABNORMAL HIGH (ref 8–23)
CO2: 26 mmol/L (ref 22–32)
Calcium: 8.3 mg/dL — ABNORMAL LOW (ref 8.9–10.3)
Chloride: 99 mmol/L (ref 98–111)
Creatinine, Ser: 1.81 mg/dL — ABNORMAL HIGH (ref 0.44–1.00)
GFR, Estimated: 28 mL/min — ABNORMAL LOW (ref >60)
Glucose, Bld: 134 mg/dL — ABNORMAL HIGH (ref 70–99)
Potassium: 3.8 mmol/L (ref 3.5–5.1)
Sodium: 139 mmol/L (ref 135–145)

## 2023-05-23 NOTE — Progress Notes (Signed)
 Daily Progress Note Intern Pager: [phone]  Patient name: Tonya Glass Medical record number: [id number] Date of birth: January 30, 1946 Age: 78 y.o. Gender: female  Primary Care Provider: No primary care provider on file. Consultants: None Code Status: DNR/DNI   Pt Overview and Major Events to Date:  05/20/23: Admitted to FMTS   Assessment and Plan:   Tonya Glass is a 78 y.o. 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  She has had 2 back-to-back admissions recently, 3 weeks ago with multifocal pneumonia and 3 weeks prior to that due to shortness of breath. Overall concerning for progressive chronic pulmonary disease. Less likely ADHF or infection. Hopeful discharge back to facility today.  Assessment & Plan Shortness of breath Most likely secondary to progressive chronic pulmonary disease. BNP 580. Procal wnl. Trop 48 > 44. Patient remains afebrile, satting well on home O2.  - Duonebs q4 scheduled, albuterol  nebs prn  - Continue home Atrovent , Dulera  (form equiv for home Symbicort) for COPD - Plan for discharge with Trelegy and duonebs prn - continue home Torsemide  60 BID for HFrEF - s/p CTX and Azithromycin  in the ED - no indication to continue at this time - O2 supplementation (home is 4 L), goal SpO2 88-92% given COPD  - Per patient's daughter, has appts with Palliative and Pulmonology scheduled outpatient  Anemia Chronic, baseline hemoglobin ~9-10.  Did receive blood transfusions during last hospitalization in setting of GI bleed requiring cauterization for angiectasia.  No reports of blood in stool during admission. Continue PPI BID and iron supplement.  Pacemaker complications Patient's pacemaker reportedly oversensing oversensing for T wave and  QRS wave per CCMD. Telemetry with V-pacing overnight. Plan for follow up with EP outpatient.    HFrEF: Continue Coreg  3.125 mg BID and Torsemide  60 mg BID HTN: Continue Imdur  15 mg daily, hydralazine  25 mg Q8h Hypothyroidism: Continue levothyroxine  75 mcg daily Depression: Continue fluoxetine  40 mg daily, held trazodone  25 mg nightly (recommended for PCP to discontinue at discharge last admission) Peripheral neuropathy: Continue gabapentin  600 mg nightly Osteoporosis: Continue cholecalciferol  2000 IU daily.  Also on alendronate 70 mg every Sunday, hold inpatient. HLD: Continue home atorvastatin  40 mg daily Chronic LE venous stasis: continue to monitor T2DM: Continue home semglee  6 U and sSSI - adjust as needed  FEN/GI: Heart healthy  PPx: Eliquis  5 BID Dispo: Discharge with return to LTC today  Subjective:  Patient feeling okay this morning. No significant changes from yesterday. Finds the breathing treatments to be very helpful.   Objective: Temp:  [97.8 F (36.6 C)-98.2 F (36.8 C)] 97.8 F (36.6 C) (01/09 0417) Pulse Rate:  [73-81] 80 (01/09 0417) Resp:  [17-22] 20 (01/09 0417) BP: (110-130)/(39-56) 130/50 (01/09 0417) SpO2:  [90 %-100 %] 96 % (01/09 0417) FiO2 (%):  [32 %] 32 % (01/09 0032) Physical Exam: General: No acute distress.  Resting comfortably in bed. CV: Loud systolic murmur. No extra heart sounds. Warm and well-perfused. Pulm: Breathing comfortably on 3.5L O2.  Crackles throughout. No increased WOB. Abd: Soft, non-tender, non-distended. Ext: Chronic lower extremity edema. Skin:  Warm, dry. Psych: Pleasant and appropriate.   Laboratory: Most recent CBC Lab Results  Component Value Date   WBC 11.8 (H) 05/22/2023   HGB 7.8 (L) 05/22/2023  HCT 25.9 (L) 05/22/2023   MCV 98.1 05/22/2023   PLT 261 05/22/2023   Most recent BMP    Latest Ref Rng & Units 05/22/2023   10:43 AM  BMP  Glucose 70 - 99 mg/dL 887   BUN 8 - 23 mg/dL 41   Creatinine 9.55 - 1.00  mg/dL 8.36   Sodium 864 - 854 mmol/L 141   Potassium 3.5 - 5.1 mmol/L 3.3   Chloride 98 - 111 mmol/L 99   CO2 22 - 32 mmol/L 30   Calcium  8.9 - 10.3 mg/dL 8.3    Diona Perkins, MD 05/23/2023, 7:07 AM  PGY-1, [facility] FPTS Intern pager: [phone], text pages welcome Secure chat group [facility] Teaching Service

## 2023-05-23 NOTE — Progress Notes (Signed)
 Patient had small BM, cleaned and dressing changed. Respiratory Therapist completed Breathing TX. Before pt. Left to [facility].

## 2023-05-23 NOTE — Telephone Encounter (Signed)
 Pt daughter called in to get a sooner appt. Sch pt to see NP Tammy Parrett on 05/29/2023.

## 2023-05-23 NOTE — Progress Notes (Signed)
 Patient was seen at bedside by me and Dr. Diona. She was at baseline, on home O2, neurologically intact and well aware of her surroundings. CXR was retrieved and shows interval worsening of bilateral pulmonary opacities and increase in the size of right pleural effusion.   On chart review, patient had b/l pulm opacities on CT scan during last admission with extensive evaluation. Unknown cause of opacities as she shows no evidence of infectious symptoms but defer further work up to pulmonology for which she has an appt on 05/28/22.   Patient would possibly benefit from a dose of lasix  and observation overnight, possibly swallow study. However, she declines this today and would like to return to her facility. Discussed risks with her and continues to decline with capacity.   Will discharge to facility at patient request.

## 2023-05-23 NOTE — Plan of Care (Signed)

## 2023-05-23 NOTE — Assessment & Plan Note (Addendum)
 Patient's pacemaker reportedly oversensing oversensing for T wave and QRS wave per CCMD. Telemetry with V-pacing overnight. Plan for follow up with EP outpatient.

## 2023-05-23 NOTE — Assessment & Plan Note (Signed)
 Chronic, baseline hemoglobin ~9-10.  Did receive blood transfusions during last hospitalization in setting of GI bleed requiring cauterization for angiectasia.  No reports of blood in stool during admission. Continue PPI BID and iron supplement.

## 2023-05-23 NOTE — Progress Notes (Signed)
 Tried to call the report for the facility. Operator was unable to connect the nurse. Will keep on trying

## 2023-05-23 NOTE — Assessment & Plan Note (Addendum)
 Most likely secondary to progressive chronic pulmonary disease. BNP 580. Procal wnl. Trop 48 > 44. Patient remains afebrile, satting well on home O2.  - Duonebs q4 scheduled, albuterol  nebs prn  - Continue home Atrovent , Dulera  (form equiv for home Symbicort) for COPD - Plan for discharge with Trelegy and duonebs prn - continue home Torsemide  60 BID for HFrEF - s/p CTX and Azithromycin  in the ED - no indication to continue at this time - O2 supplementation (home is 4 L), goal SpO2 88-92% given COPD  - Per patient's daughter, has appts with Palliative and Pulmonology scheduled outpatient

## 2023-05-23 NOTE — Plan of Care (Addendum)
 Saw patient for nursing concern about patient's ability to swallow.   Patient reports no issues with swallowing. I observed patient take a sip of water.   Towards the end of encounter, patient became sleepier and less responsive. Patient became more difficult to arouse, though arousable to shaking her shoulder multiple times. Not oriented to place or year, which is a change from this morning. Unchanged heart & lung exam.   Given change in mental status, will pursue possible AMS workup. Vitals appear stable at this time. CBGs stable in the morning.  - stat CXR - stat VBG - NPO - repeat vitals - POCT CBG - SLP consult   ____________________________________  Addendum: 05/23/23 4:45 PM Revisited patient. Patient now awake and interactive, would like to return home. Alert and oriented to self, birthday, place, and year. Unchanged heart & lung exam. Repeat vitals unremarkable, spO2 92% on home O2.   Results: Preliminary CXR (05/23/23) findings appear consistent with prior CXR (05/20/23), some possible increased opacity and effusion on R CBG 181 UA unremarkable  Overall patient appears stable at this time for discharge once again. Suspect element of waxing/waning vs sundowning during previous encounter.

## 2023-05-23 NOTE — Telephone Encounter (Signed)
 Please see last tel encounter. PT's daughter calling and says her mom is very, very sick. She is thinking of calling in hospice to tx her at her living center.   In the hospital they mentioned some fluid either in her lungs or around her lungs, daughter is not sure. They would not clarify at the hospital if it is possible for the fluid to be removed.  PT would like Dr. To call her to advise her after looking at her hx. If there is nothing that can be done maybe she should consider palliative care? Her # is [phone]  Hospice thinks hospice care would be best but her decision depends on Dr. Correne advise.

## 2023-05-24 ENCOUNTER — Encounter: Payer: Self-pay | Admitting: Internal Medicine

## 2023-05-24 ENCOUNTER — Telehealth: Payer: 59 | Admitting: Internal Medicine

## 2023-05-24 DIAGNOSIS — N189 Chronic kidney disease, unspecified: Secondary | ICD-10-CM

## 2023-05-24 DIAGNOSIS — I5022 Chronic systolic (congestive) heart failure: Secondary | ICD-10-CM

## 2023-05-24 DIAGNOSIS — J9611 Chronic respiratory failure with hypoxia: Secondary | ICD-10-CM | POA: Diagnosis not present

## 2023-05-24 DIAGNOSIS — J449 Chronic obstructive pulmonary disease, unspecified: Secondary | ICD-10-CM | POA: Diagnosis not present

## 2023-05-24 NOTE — Progress Notes (Signed)
 I connected with Tonya Glass on 05/24/2023 by video enabled telemedicine application and verified that I am speaking with the correct person using two identifiers. Patient is at home, Physician is in office.    I discussed the limitations of evaluation and management by telemedicine. The patient expressed understanding and agreed to proceed.        Tonya Glass    [id number]    [birth year]  Primary Care Physician:Patient, No Pcp Per Date of Appointment: 05/24/2023 Established Patient Visit  Chief complaint:   Chief Complaint  Patient presents with   Respiratory Distress     HPI: Tonya Glass is a 78 y.o. woman with COPD, heart failure, renal failure.   Interval Updates: Last seen by me 18 months ago. Since then over the last several months has developed worsening complications with HFrEF, chronic kidney disease, respiratory failure, recurrent hospitalizations, possible multi-focal pneumonia. No substantial improvement with slow clinical decline. Complicated by debility and weakness, GI bleed, LV thrombus.   Now on oxygen 4L. in a nursing home.   Was seen by palliative care while in patient and hospice was discussed. Tonya Glass's daughter Landry is sad to hear about her mother's decline and feels that she is not bouncing back. She wants to discuss if there are meaningful treatment options for her mother or if hospice is the way to go.   Tonya Glass is resting comfortably, on nasal cannula, laying flat. Not speaking much. She does tell me that she's tired and doesn't want to keep going to the hospital to be poked and prodded. Not eating much. Very weak.   I have reviewed the patient's family social and past medical history and updated as appropriate.   Past Medical History:  Diagnosis Date   Anemia    Anxiety    Aortic valve stenosis 10/10/2016   Arthritis    fingers (07/24/2017)   Chronic HFrEF (heart failure with reduced ejection fraction) (HCC)    CKD (chronic kidney disease)     Constipation 04/15/2023   COPD (chronic obstructive pulmonary disease) (HCC)    Depression    Gallstones    Heart murmur    High cholesterol    History of kidney stones    Hypertension    Hypothyroidism    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   Migraines    none in years til 01/2013-02/2013; none since  (07/24/2017)   Orthopnoea    just since open heart surgery (02/25/2013)   Pacemaker    [employer]    Pneumonia 07/23/2017   Stroke (HCC) 2001   denies residual on 02/25/2013   Symptomatic hypotension 10/10/2016   Syncope and collapse    prior to open heart surgery (02/25/2013)   Type II diabetes mellitus (HCC)     Past Surgical History:  Procedure Laterality Date   AORTIC ARCH ANGIOGRAPHY N/A 08/07/2021   Procedure: AORTIC ARCH ANGIOGRAPHY;  Surgeon: Wonda Sharper, MD;  Location: [facility] INVASIVE CV LAB;  Service: Cardiovascular;  Laterality: N/A;   BREAST SURGERY Right    clamped blood vessel and stitched it up; in [place]   CARDIAC CATHETERIZATION     CARDIAC VALVE REPLACEMENT  01/26/2013   cow valve put in; widened the second valve (02/25/2013) - [place], [place]   CATARACT EXTRACTION W/ INTRAOCULAR LENS  IMPLANT, BILATERAL Bilateral 04/2017   CHOLECYSTECTOMY OPEN  1971   CYST EXCISION     taken off my chest   ESOPHAGOGASTRODUODENOSCOPY (EGD) WITH PROPOFOL  N/A 05/03/2023   Procedure: ESOPHAGOGASTRODUODENOSCOPY (EGD) WITH PROPOFOL ;  Surgeon:  Pyrtle, Gordy HERO, MD;  Location[facility] ENDOSCOPY;  Service: Gastroenterology;  Laterality: N/A;   HOT HEMOSTASIS N/A 05/03/2023   Procedure: HOT HEMOSTASIS (ARGON PLASMA COAGULATION/BICAP);  Surgeon: Albertus Gordy HERO, MD;  Location: [facility] ENDOSCOPY;  Service: Gastroenterology;  Laterality: N/A;   INSERT / REPLACE / REMOVE PACEMAKER  01/19/2013   [employer] - dual chamber   LAPAROSCOPIC GASTRIC SLEEVE RESECTION  2009   in [place]   PPM GENERATOR CHANGEOUT N/A 06/22/2022   Procedure: PPM GENERATOR CHANGEOUT;  Surgeon: Fernande Elspeth BROCKS, MD;   Location: [facility] INVASIVE CV LAB;  Service: Cardiovascular;  Laterality: N/A;   RIGHT HEART CATH N/A 04/17/2023   Procedure: RIGHT HEART CATH;  Surgeon: Verlin Lonni BIRCH, MD;  Location: [facility] INVASIVE CV LAB;  Service: Cardiovascular;  Laterality: N/A;   RIGHT/LEFT HEART CATH AND CORONARY ANGIOGRAPHY N/A 08/07/2021   Procedure: RIGHT/LEFT HEART CATH AND CORONARY ANGIOGRAPHY;  Surgeon: Wonda Sharper, MD;  Location: [facility] INVASIVE CV LAB;  Service: Cardiovascular;  Laterality: N/A;   TEE WITHOUT CARDIOVERSION N/A 06/13/2021   Procedure: TRANSESOPHAGEAL ECHOCARDIOGRAM (TEE);  Surgeon: Francyne Headland, MD;  Location: [facility] ENDOSCOPY;  Service: Cardiovascular;  Laterality: N/A;   VEIN SURGERY Left    LLE; cut leg open and took out piece of vein that was protruding thru skin; stitched it up; done in [place]    Family History  Problem Relation Age of Onset   Lung cancer Mother    Heart Problems Father    Hypertension Brother    Colon polyps Brother    Skin cancer Brother    Asthma Brother    Colon polyps Daughter    Diabetes Maternal Aunt    Breast cancer Maternal Aunt        mets to liver   Diabetes Paternal Aunt     Social History   Occupational History   Occupation: retired  Tobacco Use   Smoking status: Former    Current packs/day: 0.00    Average packs/day: 2.0 packs/day for 40.0 years (80.0 ttl pk-yrs)    Types: Cigarettes    Start date: 05/29/1959    Quit date: 05/29/1999    Years since quitting: 24.0   Smokeless tobacco: Never  Vaping Use   Vaping status: Never Used  Substance and Sexual Activity   Alcohol use: No   Drug use: No   Sexual activity: Not Currently     Physical Exam: There were no vitals taken for this visit.  Chronically ill appearing On nasal cannula Resting with eyes closed Opens to voice and weakly answers questions. Breathing non labored Extensive cutaneous hemorrhages noted on upper extremities   Data Reviewed: Imaging: I have personally reviewed  the ct chest which shows patulous esophagus, multifocal pneumonia, bibasilar atelectasis and tree in bud,  Echo in November shows EF 45% with LV thrombus  PFTs:     Latest Ref Rng & Units 12/08/2021   12:02 PM  PFT Results  FVC-Pre L 0.95   FVC-Predicted Pre % 40   FVC-Post L 0.87   FVC-Predicted Post % 37   Pre FEV1/FVC % % 75   Post FEV1/FCV % % 80   FEV1-Pre L 0.72   FEV1-Predicted Pre % 41   FEV1-Post L 0.70   DLCO uncorrected ml/min/mmHg 12.16   DLCO UNC% % 73   DLCO corrected ml/min/mmHg 12.16   DLCO COR %Predicted % 73   DLVA Predicted % 134   TLC L 5.77   TLC % Predicted % 129   RV %  Predicted % 214    I have personally reviewed the patient's PFTs and severe airflow limitation.   Labs: Lab Results  Component Value Date   WBC 12.0 (H) 05/23/2023   HGB 7.8 (L) 05/23/2023   HCT 25.6 (L) 05/23/2023   MCV 96.6 05/23/2023   PLT 222 05/23/2023   Lab Results  Component Value Date   NA 139 05/23/2023   K 3.8 05/23/2023   CL 99 05/23/2023   CO2 26 05/23/2023    Immunization status: Immunization History  Administered Date(s) Administered   Influenza Split 01/26/2017   Influenza, High Dose Seasonal PF 01/22/2014, 01/22/2014, 02/11/2015, 02/11/2015, 02/02/2016, 12/27/2016, 01/25/2018, 01/22/2019   Influenza,inj,Quad PF,6+ Mos 01/22/2014, 02/11/2015   Influenza,inj,quad, With Preservative 02/02/2016   Influenza-Unspecified 01/26/2017   Pneumococcal Conjugate-13 12/08/2013, 10/04/2015   Pneumococcal Polysaccharide-23 06/24/2013, 06/22/2018   Pneumococcal-Unspecified 12/08/2013   Tetanus 06/20/2017   Zoster Recombinant(Shingrix) 02/13/2017, 02/13/2017   Zoster, Live 12/14/2013    External Records Personally Reviewed: hospital stay  Assessment:  Severe COPD FEV1 41% of predicted Chronic Kidney Disease Chronic Congestive heart failure EF 45% LV Thrombus Chronic respiratory failure on 4LNC Debility Deconditioning  Plan/Recommendations: Spoke with Beth  and Maresha at length about goals of care and treatable conditions. Unfortunately has multiple chronic medical issues which are progressive.   We discussed DNR and transition to outpatient hospice as the most appropriate next step.   Offered my condolences. I am available for patient and family support as needed. Charlot is able to transition to outpatient hospice at her current SNF.   Return to Care: As needed.    Verdon Gore, MD Pulmonary and Critical Care Medicine [facility] Office:[phone]

## 2023-05-28 ENCOUNTER — Telehealth: Payer: Self-pay | Admitting: *Deleted

## 2023-05-28 ENCOUNTER — Telehealth: Payer: Self-pay | Admitting: Cardiovascular Disease

## 2023-05-28 NOTE — Telephone Encounter (Signed)
 Called and spoke with patient's daughter Landry Piety [facility]), she verified that her mother passed away last night.  Offered condolences on her loss and let her know to advise her if there is anything we can do for her.  She verbalized understanding.  Nothing further needed.

## 2023-05-28 NOTE — Telephone Encounter (Signed)
 Daughter Blanchard) called to let Dr. Francyne know patient passed away last night.  Daughter also wanted to get the name of the facility Dr. Francyne recommended to her (the daughter) that she get her heart checked - daughter stated she believes the facility was on [address]

## 2023-05-29 ENCOUNTER — Inpatient Hospital Stay: Payer: 59 | Admitting: Adult Health

## 2023-05-31 NOTE — Addendum Note (Signed)
Addended by: Elease Etienne A on: 05/31/2023 01:40 PM   Modules accepted: Orders, Level of Service

## 2023-05-31 NOTE — Telephone Encounter (Signed)
I called and spoke with her daughter Waynetta Sandy and offered my condolences on Cynia's passing away.

## 2023-05-31 NOTE — Progress Notes (Signed)
Remote pacemaker transmission.   

## 2023-06-15 DEATH — deceased

## 2023-07-15 ENCOUNTER — Inpatient Hospital Stay: Payer: 59 | Admitting: Internal Medicine

## 2023-08-01 ENCOUNTER — Encounter: Payer: 59 | Admitting: Cardiovascular Disease
